# Patient Record
Sex: Male | Born: 1955
Health system: Southern US, Community
[De-identification: ages and names within clinical notes are randomized; demographics above are authoritative.]

## PROBLEM LIST (undated history)

## (undated) DIAGNOSIS — K769 Liver disease, unspecified: Secondary | ICD-10-CM

## (undated) DIAGNOSIS — F32A Depression, unspecified: Secondary | ICD-10-CM

## (undated) DIAGNOSIS — R569 Unspecified convulsions: Secondary | ICD-10-CM

## (undated) DIAGNOSIS — I639 Cerebral infarction, unspecified: Secondary | ICD-10-CM

## (undated) DIAGNOSIS — I10 Essential (primary) hypertension: Secondary | ICD-10-CM

## (undated) DIAGNOSIS — F329 Major depressive disorder, single episode, unspecified: Secondary | ICD-10-CM

## (undated) HISTORY — DX: Unspecified convulsions: R56.9

## (undated) HISTORY — DX: Cerebral infarction, unspecified: I63.9

## (undated) HISTORY — DX: Depression, unspecified: F32.A

## (undated) HISTORY — PX: CATARACT EXTRACTION: SUR2

## (undated) HISTORY — PX: TONSILLECTOMY AND ADENOIDECTOMY: SUR1326

---
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
## 2001-10-08 DIAGNOSIS — N329 Bladder disorder, unspecified: Secondary | ICD-10-CM

## 2001-10-08 HISTORY — DX: Bladder disorder, unspecified: N32.9

## 2002-10-09 ENCOUNTER — Ambulatory Visit (HOSPITAL_BASED_OUTPATIENT_CLINIC_OR_DEPARTMENT_OTHER): Admission: RE | Admit: 2002-10-09 | Discharge: 2002-10-09 | Payer: Self-pay | Admitting: Urology

## 2003-04-09 ENCOUNTER — Ambulatory Visit (HOSPITAL_COMMUNITY): Admission: RE | Admit: 2003-04-09 | Discharge: 2003-04-09 | Payer: Self-pay | Admitting: Urology

## 2003-04-09 ENCOUNTER — Ambulatory Visit (HOSPITAL_BASED_OUTPATIENT_CLINIC_OR_DEPARTMENT_OTHER): Admission: RE | Admit: 2003-04-09 | Discharge: 2003-04-09 | Payer: Self-pay | Admitting: Urology

## 2005-05-23 ENCOUNTER — Encounter: Admission: RE | Admit: 2005-05-23 | Discharge: 2005-05-23 | Payer: Self-pay | Admitting: General Surgery

## 2012-06-06 ENCOUNTER — Other Ambulatory Visit: Payer: Self-pay | Admitting: Gastroenterology

## 2012-06-06 DIAGNOSIS — R197 Diarrhea, unspecified: Secondary | ICD-10-CM

## 2012-06-14 ENCOUNTER — Ambulatory Visit
Admission: RE | Admit: 2012-06-14 | Discharge: 2012-06-14 | Disposition: A | Discharge status: Home / Self Care | Payer: 59 | Source: Ambulatory Visit | Attending: Gastroenterology | Admitting: Gastroenterology

## 2012-06-14 DIAGNOSIS — R197 Diarrhea, unspecified: Secondary | ICD-10-CM

## 2012-06-14 MED ORDER — IOHEXOL 300 MG/ML  SOLN: 100.0000 mL | Freq: Once | INTRAMUSCULAR | Status: AC | PRN

## 2013-10-14 ENCOUNTER — Other Ambulatory Visit: Payer: Self-pay | Admitting: Gastroenterology

## 2013-10-14 DIAGNOSIS — R112 Nausea with vomiting, unspecified: Secondary | ICD-10-CM

## 2013-11-08 ENCOUNTER — Ambulatory Visit
Admission: RE | Admit: 2013-11-08 | Discharge: 2013-11-08 | Disposition: A | Discharge status: Home / Self Care | Payer: PRIVATE HEALTH INSURANCE | Source: Ambulatory Visit | Attending: Gastroenterology | Admitting: Gastroenterology

## 2013-11-08 DIAGNOSIS — R112 Nausea with vomiting, unspecified: Secondary | ICD-10-CM

## 2014-11-20 ENCOUNTER — Ambulatory Visit (INDEPENDENT_AMBULATORY_CARE_PROVIDER_SITE_OTHER): Payer: PRIVATE HEALTH INSURANCE

## 2014-11-20 ENCOUNTER — Ambulatory Visit (INDEPENDENT_AMBULATORY_CARE_PROVIDER_SITE_OTHER): Payer: PRIVATE HEALTH INSURANCE | Admitting: Podiatry

## 2014-11-20 DIAGNOSIS — L600 Ingrowing nail: Secondary | ICD-10-CM

## 2014-11-20 DIAGNOSIS — M2010 Hallux valgus (acquired), unspecified foot: Secondary | ICD-10-CM

## 2014-11-20 DIAGNOSIS — L603 Nail dystrophy: Secondary | ICD-10-CM | POA: Diagnosis not present

## 2014-11-20 DIAGNOSIS — L608 Other nail disorders: Secondary | ICD-10-CM | POA: Diagnosis not present

## 2014-11-20 MED ORDER — NEOMYCIN-POLYMYXIN-HC 3.5-10000-1 OT SOLN: OTIC | Status: DC

## 2014-11-20 NOTE — Progress Notes (Signed)
   Subjective:    Patient ID: Jon [name], male    DOB: [birth year], 59 y.o.   MRN: [id number]  HPI Comments: "I have problems with my feet"  Patient states that he gets numbness 1st toes bilateral-medial borders, left over right, for several months. Some tenderness by end of day. Notices 1st MPJ bilateral get red. Can't find a shoe that fits well.   Toe Pain   Foot Pain Associated symptoms include arthralgias and coughing.      Review of Systems  Respiratory: Positive for cough.   Musculoskeletal: Positive for arthralgias.  Skin:       Change in nails  All other systems reviewed and are negative.      Objective:   Physical Exam: I have reviewed his past medical history medications allergies surgery social history and review of systems. Pulses are palpable bilateral neurologic extensor was intact percent C monofilament. Deep tendon reflex intact bilaterally muscle strength is 5 over 5 dorsiflexion plantar flexors and inverters everters MUSCULATURE is intact. Orthopedic evaluation demonstrate hallux abductovalgus deformities bilateral with pain on palpation of the medial prominence of the head of the first metatarsal. He also has pain on range of motion of the first metatarsophalangeal joint. Cutaneous evaluation demonstrates supple well-hydrated U is dorsally sharply rated now margins to the tibial border of the hallux bilateral. Nails are thick yellow dystrophic onychomycotic painful palpation as well as debridement.        Assessment & Plan:  Assessment: Ingrown nail paronychia abscess hallux left.  Plan: Discussed etiology pathology conservative versus surgical therapies. Performed a chemical matrixectomy to the tibial borders of the hallux bilaterally tolerated his procedure well he was given both oral and written home-going instructions for care and soaking of his foot as well as a prescription for Cortisporin Otic. Samples of the nail and skin were taken today for pathologic  evaluation. I discussed briefly surgical intervention regarding his bunions and he is stating that he would have to be an retirement before he can take that much time off. I will follow-up with him in 1 week.

## 2014-11-20 NOTE — Patient Instructions (Addendum)

## 2014-11-27 ENCOUNTER — Ambulatory Visit (INDEPENDENT_AMBULATORY_CARE_PROVIDER_SITE_OTHER): Payer: PRIVATE HEALTH INSURANCE | Admitting: Podiatry

## 2014-11-27 ENCOUNTER — Encounter: Payer: Self-pay | Admitting: Podiatry

## 2014-11-27 DIAGNOSIS — L600 Ingrowing nail: Secondary | ICD-10-CM

## 2014-11-27 NOTE — Progress Notes (Signed)
This patient presents today for follow-up matrixectomy. They continue to soak twice daily and apply Cortisporin otic as directed. Relating no complaints.  Objective: Vital signs are stable. Secondly site appears to be healing well without erythema or drainage purulence or odor.  Assessment: Well-healing surgical matrixectomy without complications. Hallux abductovalgus deformity.  Plan: Currently we will discontinue the use of Betadine soaks and start with Epsom salts and water twice daily. Continue the use of Cortisporin Otic and covered during the day leaving it open at night time. They will continue to soak the toe until completely well. They will continue to watch the toe for signs and symptoms of infection should any arise we will be notified immediately. Follow-up when necessary. We also discussed the need for orthotics however his insurance does not cover them at this point. I will follow-up with him as needed.

## 2014-11-27 NOTE — Patient Instructions (Signed)

## 2014-12-02 ENCOUNTER — Ambulatory Visit: Payer: PRIVATE HEALTH INSURANCE | Admitting: Podiatry

## 2014-12-18 ENCOUNTER — Encounter: Payer: Self-pay | Admitting: Podiatry

## 2014-12-18 ENCOUNTER — Ambulatory Visit (INDEPENDENT_AMBULATORY_CARE_PROVIDER_SITE_OTHER): Payer: PRIVATE HEALTH INSURANCE | Admitting: Podiatry

## 2014-12-18 VITALS — BP 157/59 | HR 88 | Resp 16

## 2014-12-18 DIAGNOSIS — L603 Nail dystrophy: Secondary | ICD-10-CM

## 2014-12-18 NOTE — Progress Notes (Signed)
He presents today for his lab reports for his toenails and skin.  Objective: Vital signs are stable he is alert and oriented 3. Positive onychomycosis bilateral.  Assessment: Onychomycosis bilateral.  Plan at this point in time he does not want to treat basal history of liver toxicity and a fatty liver. He also does not want utilizing topical.

## 2016-01-08 ENCOUNTER — Inpatient Hospital Stay (HOSPITAL_COMMUNITY)
Admission: EM | Admit: 2016-01-08 | Discharge: 2016-01-14 | DRG: 064 | Disposition: A | Discharge status: Rehabilitation Facility | Payer: PRIVATE HEALTH INSURANCE | Source: Intra-hospital | Attending: Neurology | Admitting: Neurology

## 2016-01-08 ENCOUNTER — Encounter (HOSPITAL_COMMUNITY): Payer: Self-pay

## 2016-01-08 ENCOUNTER — Emergency Department (HOSPITAL_COMMUNITY): Payer: PRIVATE HEALTH INSURANCE

## 2016-01-08 DIAGNOSIS — I10 Essential (primary) hypertension: Secondary | ICD-10-CM | POA: Diagnosis present

## 2016-01-08 DIAGNOSIS — R0602 Shortness of breath: Secondary | ICD-10-CM | POA: Diagnosis not present

## 2016-01-08 DIAGNOSIS — I619 Nontraumatic intracerebral hemorrhage, unspecified: Secondary | ICD-10-CM | POA: Diagnosis present

## 2016-01-08 DIAGNOSIS — I169 Hypertensive crisis, unspecified: Secondary | ICD-10-CM | POA: Diagnosis present

## 2016-01-08 DIAGNOSIS — R269 Unspecified abnormalities of gait and mobility: Secondary | ICD-10-CM | POA: Diagnosis present

## 2016-01-08 DIAGNOSIS — Z79899 Other long term (current) drug therapy: Secondary | ICD-10-CM

## 2016-01-08 DIAGNOSIS — I69398 Other sequelae of cerebral infarction: Secondary | ICD-10-CM | POA: Diagnosis not present

## 2016-01-08 DIAGNOSIS — R739 Hyperglycemia, unspecified: Secondary | ICD-10-CM | POA: Diagnosis present

## 2016-01-08 DIAGNOSIS — Z7982 Long term (current) use of aspirin: Secondary | ICD-10-CM

## 2016-01-08 DIAGNOSIS — E871 Hypo-osmolality and hyponatremia: Secondary | ICD-10-CM | POA: Diagnosis present

## 2016-01-08 DIAGNOSIS — R1310 Dysphagia, unspecified: Secondary | ICD-10-CM | POA: Diagnosis present

## 2016-01-08 DIAGNOSIS — E876 Hypokalemia: Secondary | ICD-10-CM | POA: Diagnosis present

## 2016-01-08 DIAGNOSIS — F32A Depression, unspecified: Secondary | ICD-10-CM | POA: Diagnosis present

## 2016-01-08 DIAGNOSIS — G936 Cerebral edema: Secondary | ICD-10-CM | POA: Diagnosis present

## 2016-01-08 DIAGNOSIS — D696 Thrombocytopenia, unspecified: Secondary | ICD-10-CM | POA: Diagnosis present

## 2016-01-08 DIAGNOSIS — G8194 Hemiplegia, unspecified affecting left nondominant side: Secondary | ICD-10-CM | POA: Diagnosis present

## 2016-01-08 DIAGNOSIS — Z6830 Body mass index (BMI) 30.0-30.9, adult: Secondary | ICD-10-CM

## 2016-01-08 DIAGNOSIS — F1010 Alcohol abuse, uncomplicated: Secondary | ICD-10-CM | POA: Diagnosis present

## 2016-01-08 DIAGNOSIS — E785 Hyperlipidemia, unspecified: Secondary | ICD-10-CM | POA: Diagnosis present

## 2016-01-08 DIAGNOSIS — F329 Major depressive disorder, single episode, unspecified: Secondary | ICD-10-CM | POA: Diagnosis present

## 2016-01-08 DIAGNOSIS — Z91011 Allergy to milk products: Secondary | ICD-10-CM

## 2016-01-08 DIAGNOSIS — Z91018 Allergy to other foods: Secondary | ICD-10-CM | POA: Diagnosis not present

## 2016-01-08 DIAGNOSIS — I610 Nontraumatic intracerebral hemorrhage in hemisphere, subcortical: Secondary | ICD-10-CM | POA: Diagnosis present

## 2016-01-08 DIAGNOSIS — I69319 Unspecified symptoms and signs involving cognitive functions following cerebral infarction: Secondary | ICD-10-CM | POA: Diagnosis not present

## 2016-01-08 DIAGNOSIS — R471 Dysarthria and anarthria: Secondary | ICD-10-CM | POA: Diagnosis present

## 2016-01-08 DIAGNOSIS — I69391 Dysphagia following cerebral infarction: Secondary | ICD-10-CM | POA: Diagnosis present

## 2016-01-08 DIAGNOSIS — I6789 Other cerebrovascular disease: Secondary | ICD-10-CM | POA: Diagnosis not present

## 2016-01-08 DIAGNOSIS — I69359 Hemiplegia and hemiparesis following cerebral infarction affecting unspecified side: Secondary | ICD-10-CM | POA: Diagnosis not present

## 2016-01-08 DIAGNOSIS — E669 Obesity, unspecified: Secondary | ICD-10-CM | POA: Diagnosis present

## 2016-01-08 DIAGNOSIS — K769 Liver disease, unspecified: Secondary | ICD-10-CM | POA: Diagnosis present

## 2016-01-08 DIAGNOSIS — I161 Hypertensive emergency: Secondary | ICD-10-CM | POA: Diagnosis not present

## 2016-01-08 HISTORY — DX: Essential (primary) hypertension: I10

## 2016-01-08 HISTORY — DX: Liver disease, unspecified: K76.9

## 2016-01-08 LAB — COMPREHENSIVE METABOLIC PANEL
ALT: 80 U/L — ABNORMAL HIGH (ref 17–63)
AST: 121 U/L — ABNORMAL HIGH (ref 15–41)
Albumin: 3.5 g/dL (ref 3.5–5.0)
Alkaline Phosphatase: 67 U/L (ref 38–126)
Anion gap: 9 (ref 5–15)
BUN: 8 mg/dL (ref 6–20)
CO2: 22 mmol/L (ref 22–32)
Calcium: 9.4 mg/dL (ref 8.9–10.3)
Chloride: 109 mmol/L (ref 101–111)
Creatinine, Ser: 0.7 mg/dL (ref 0.61–1.24)
GFR calc Af Amer: >60 mL/min (ref >60)
GFR calc non Af Amer: >60 mL/min (ref >60)
Glucose, Bld: 143 mg/dL — ABNORMAL HIGH (ref 65–99)
Potassium: 4.1 mmol/L (ref 3.5–5.1)
Sodium: 140 mmol/L (ref 135–145)
Total Bilirubin: 0.6 mg/dL (ref 0.3–1.2)
Total Protein: 6.5 g/dL (ref 6.5–8.1)

## 2016-01-08 LAB — I-STAT CHEM 8, ED
BUN: 8 mg/dL (ref 6–20)
Calcium, Ion: 1.09 mmol/L — ABNORMAL LOW (ref 1.12–1.23)
Chloride: 105 mmol/L (ref 101–111)
Creatinine, Ser: 0.6 mg/dL — ABNORMAL LOW (ref 0.61–1.24)
Glucose, Bld: 144 mg/dL — ABNORMAL HIGH (ref 65–99)
HCT: 45 % (ref 39.0–52.0)
Hemoglobin: 15.3 g/dL (ref 13.0–17.0)
Potassium: 4 mmol/L (ref 3.5–5.1)
Sodium: 144 mmol/L (ref 135–145)
TCO2: 22 mmol/L (ref 0–100)

## 2016-01-08 LAB — GLUCOSE, CAPILLARY
GLUCOSE-CAPILLARY: 153 mg/dL — AB (ref 65–99)
Glucose-Capillary: 115 mg/dL — ABNORMAL HIGH (ref 65–99)

## 2016-01-08 LAB — DIFFERENTIAL
Basophils Absolute: 0.1 10*3/uL (ref 0.0–0.1)
Basophils Relative: 1 %
Eosinophils Absolute: 0.3 10*3/uL (ref 0.0–0.7)
Eosinophils Relative: 5 %
Lymphocytes Relative: 27 %
Lymphs Abs: 2 10*3/uL (ref 0.7–4.0)
Monocytes Absolute: 0.6 10*3/uL (ref 0.1–1.0)
Monocytes Relative: 8 %
Neutro Abs: 4.5 10*3/uL (ref 1.7–7.7)
Neutrophils Relative %: 59 %

## 2016-01-08 LAB — CBC
HCT: 43.7 % (ref 39.0–52.0)
Hemoglobin: 14.8 g/dL (ref 13.0–17.0)
MCH: 34.4 pg — ABNORMAL HIGH (ref 26.0–34.0)
MCHC: 33.9 g/dL (ref 30.0–36.0)
MCV: 101.6 fL — ABNORMAL HIGH (ref 78.0–100.0)
Platelets: 138 10*3/uL — ABNORMAL LOW (ref 150–400)
RBC: 4.3 MIL/uL (ref 4.22–5.81)
RDW: 12.3 % (ref 11.5–15.5)
WBC: 7.5 10*3/uL (ref 4.0–10.5)

## 2016-01-08 LAB — CBG MONITORING, ED: Glucose-Capillary: 130 mg/dL — ABNORMAL HIGH (ref 65–99)

## 2016-01-08 LAB — MRSA PCR SCREENING: MRSA by PCR: NEGATIVE

## 2016-01-08 LAB — I-STAT TROPONIN, ED: Troponin i, poc: 0 ng/mL (ref 0.00–0.08)

## 2016-01-08 LAB — PROTIME-INR
INR: 1.07 (ref 0.00–1.49)
Prothrombin Time: 14.1 seconds (ref 11.6–15.2)

## 2016-01-08 LAB — APTT: aPTT: 24 seconds (ref 24–37)

## 2016-01-08 MED ORDER — NICARDIPINE HCL IN NACL 20-0.86 MG/200ML-% IV SOLN
3.0000 mg/h | INTRAVENOUS | Status: DC
  Administered 2016-01-08 – 2016-01-09 (×2): 5 mg/h via INTRAVENOUS
  Administered 2016-01-09 (×3): 7.5 mg/h via INTRAVENOUS
  Administered 2016-01-09: 3 mg/h via INTRAVENOUS
  Administered 2016-01-09: 5 mg/h via INTRAVENOUS
  Filled 2016-01-08 (×7): qty 200

## 2016-01-08 MED ORDER — ACETAMINOPHEN-CODEINE #3 300-30 MG PO TABS
1.0000 | ORAL_TABLET | ORAL | Status: DC | PRN
  Administered 2016-01-09 – 2016-01-11 (×8): 2 via ORAL
  Administered 2016-01-12: 1 via ORAL
  Administered 2016-01-12 – 2016-01-14 (×6): 2 via ORAL
  Filled 2016-01-08 (×9): qty 2
  Filled 2016-01-08: qty 1
  Filled 2016-01-08 (×6): qty 2

## 2016-01-08 MED ORDER — ACETAMINOPHEN 325 MG PO TABS
650.0000 mg | ORAL_TABLET | ORAL | Status: DC | PRN
  Administered 2016-01-11 – 2016-01-14 (×2): 650 mg via ORAL
  Filled 2016-01-08 (×4): qty 2

## 2016-01-08 MED ORDER — SENNOSIDES-DOCUSATE SODIUM 8.6-50 MG PO TABS
1.0000 | ORAL_TABLET | Freq: Two times a day (BID) | ORAL | Status: DC
Start: 2016-01-08 — End: 2016-01-14
  Administered 2016-01-10 – 2016-01-14 (×8): 1 via ORAL
  Filled 2016-01-08 (×10): qty 1

## 2016-01-08 MED ORDER — MORPHINE SULFATE (PF) 2 MG/ML IV SOLN
2.0000 mg | INTRAVENOUS | Status: DC | PRN
  Administered 2016-01-08 – 2016-01-10 (×4): 2 mg via INTRAVENOUS
  Filled 2016-01-08 (×4): qty 1

## 2016-01-08 MED ORDER — STROKE: EARLY STAGES OF RECOVERY BOOK
Freq: Once | Status: AC
  Administered 2016-01-08: 18:00:00
  Filled 2016-01-08: qty 1

## 2016-01-08 MED ORDER — LABETALOL HCL 5 MG/ML IV SOLN
10.0000 mg | INTRAVENOUS | Status: DC | PRN
  Administered 2016-01-09: 20 mg via INTRAVENOUS
  Administered 2016-01-09: 40 mg via INTRAVENOUS
  Filled 2016-01-08: qty 4
  Filled 2016-01-08: qty 8

## 2016-01-08 MED ORDER — ACETAMINOPHEN 650 MG RE SUPP
650.0000 mg | RECTAL | Status: DC | PRN
  Administered 2016-01-08 – 2016-01-09 (×2): 650 mg via RECTAL
  Filled 2016-01-08 (×2): qty 1

## 2016-01-08 MED ORDER — NICARDIPINE HCL IN NACL 20-0.86 MG/200ML-% IV SOLN: 3.0000 mg/h | INTRAVENOUS | Status: DC

## 2016-01-08 MED ORDER — FAMOTIDINE IN NACL 20-0.9 MG/50ML-% IV SOLN
20.0000 mg | Freq: Two times a day (BID) | INTRAVENOUS | Status: DC
  Administered 2016-01-08 – 2016-01-09 (×2): 20 mg via INTRAVENOUS
  Filled 2016-01-08 (×2): qty 50

## 2016-01-08 MED ORDER — PNEUMOCOCCAL VAC POLYVALENT 25 MCG/0.5ML IJ INJ
0.5000 mL | INJECTION | INTRAMUSCULAR | Status: AC
  Administered 2016-01-09: 0.5 mL via INTRAMUSCULAR
  Filled 2016-01-08: qty 0.5

## 2016-01-08 MED ORDER — NICARDIPINE HCL IN NACL 20-0.86 MG/200ML-% IV SOLN
INTRAVENOUS | Status: AC
  Filled 2016-01-08: qty 200

## 2016-01-08 MED ORDER — MORPHINE SULFATE (PF) 2 MG/ML IV SOLN
1.0000 mg | Freq: Once | INTRAVENOUS | Status: AC
  Administered 2016-01-08: 1 mg via INTRAVENOUS
  Filled 2016-01-08: qty 1

## 2016-01-08 MED ORDER — FENTANYL CITRATE (PF) 100 MCG/2ML IJ SOLN
50.0000 ug | Freq: Once | INTRAMUSCULAR | Status: AC
  Administered 2016-01-08: 50 ug via INTRAVENOUS
  Filled 2016-01-08: qty 2

## 2016-01-08 NOTE — ED Notes (Signed)
Checked pt. Pupils on 1400 neuro check. Equal and reactive, 3 mm.

## 2016-01-08 NOTE — Code Documentation (Addendum)
60[facility] at [facility]6.  Patient from home where he was attempting to have intercourse with his wife and had a sudden headache over his right eye.  He went to take a shower and went back to bed.  He then fell out of bed and his wife noted he was unable to move his left side.  EMS was called and activated a code stroke for right gaze, left sided weakness and slurred speech.  Stroke team at the bedside on patient arrival.  Labs drawn and patient cleared for CT by Dr. Joni FearsLui.  Patient to CT.  CT completed showing ICH.  Patient transported to A9.  NIHSS 18, see documentation for details and code stroke times.  Patient with right gaze preference, left visual loss, left facial droop, left hemiplegia, loss of sensation on the left side and dysarthria.  Dr. Otelia LimesLindzen to the bedside.  Patient hypertensive and verbal order given for Cardene gtt to keep SBP < 160.  Cardene gtt started at 5mg /hr.  BP within goal, however, patient became progressively agitated r/t the need to urinate and have a bowel movement.  Patient unable to void in the urinal and condom cath placed.  Patient required frequent redirection and continuous efforts to help patient relax.  Patient diaphoretic and HR 100s, however, denies pain other than needing to urinate.  Washcloth and request to decrease temperature in room.  Cardene gtt titrated up to the max of 15mg /hr d/t continued hypertension.  Patient eventually urinated and became calm and relaxed.  Cardene gtt titrated down.  Patient to be admitted to ICU.  Patient's wife at the bedside and updated on plan of care.  Bedside handoff with ED RN Redmond BasemanHayden.

## 2016-01-08 NOTE — Consult Note (Deleted)
Referring Physician: Dr. Wilson Singer    Chief Complaint: Acute onset of headache and left sided weakness.   HPI: Jon [name] is a 60 y.o. male who was last normal this AM at 0940 after waking up and taking a shower. He went back to bed and during marital relations he suddenly developed a headache and left sided weakness. He also had slurring of speech. His headache is severe and located behind his right eye. Initial BP was elevated but was brought under control with nicardipine gtt in the ED.   LSN: [id number] tPA Given: No. Patient has [place].  ICH Score: 1  Past Medical History  Diagnosis Date  . Hypertension   . Liver damage     History reviewed. No pertinent past surgical history.  No family history on file. Social History:  reports that he has never smoked. He does not have any smokeless tobacco history on file. He reports that he drinks about 7.2 oz of alcohol per week. He reports that he does not use illicit drugs.  Allergies:  Allergies  Allergen Reactions  . Other     Tree nuts     Medications: Robinul, metoprolol, pravastatin, ASA, Benadryl, Viagra.  ROS: As per HPI.   Physical Examination: Blood pressure 131/56, pulse 83, temperature 97.6 F (36.4 C), temperature source Oral, resp. rate 21, height _0  (1.727 m), weight 90.719 kg (200 lb), SpO2 97 %.  Neurologic Examination: Ment: Alert and oriented. Able to follow all commands and describe recent symptoms. No agitation noted. Drowsy.  CN: PERRL. Left visual field cut. Rightward eye deviation, can bring eyes to midline but cannot cross to left - this can be overcome with doll's eye maneuver. V - Impaired sensation on left. VII - Left facial droop. Hearing intact to conversation. No hoarseness or hypophonia. Tongue midline.  Motor: RUE and RLE 5/5. LUE 0/5. LLE 3/5.  Sensory: Impaired sensation to LUE and LLE.  Reflexes: Slightly hyperactive on left relative to right. Left toe upgoing.  Cerebellar: No ataxia with FNF on  right.  Gait: Deferred.   Results for orders placed or performed during the hospital encounter of 01/08/16 (from the past 48 hour(s))  Protime-INR     Status: None   Collection Time: 01/08/16 11:20 AM  Result Value Ref Range   Prothrombin Time 14.1 11.6 - 15.2 seconds   INR 1.07 0.00 - 1.49  APTT     Status: None   Collection Time: 01/08/16 11:20 AM  Result Value Ref Range   aPTT 24 24 - 37 seconds  CBC     Status: Abnormal   Collection Time: 01/08/16 11:20 AM  Result Value Ref Range   WBC 7.5 4.0 - 10.5 K/uL   RBC 4.30 4.22 - 5.81 MIL/uL   Hemoglobin 14.8 13.0 - 17.0 g/dL   HCT 43.7 39.0 - 52.0 %   MCV 101.6 (H) 78.0 - 100.0 fL   MCH 34.4 (H) 26.0 - 34.0 pg   MCHC 33.9 30.0 - 36.0 g/dL   RDW 12.3 11.5 - 15.5 %   Platelets 138 (L) 150 - 400 K/uL  Differential     Status: None   Collection Time: 01/08/16 11:20 AM  Result Value Ref Range   Neutrophils Relative % 59 %   Neutro Abs 4.5 1.7 - 7.7 K/uL   Lymphocytes Relative 27 %   Lymphs Abs 2.0 0.7 - 4.0 K/uL   Monocytes Relative 8 %   Monocytes Absolute 0.6 0.1 - 1.0 K/uL  Eosinophils Relative 5 %   Eosinophils Absolute 0.3 0.0 - 0.7 K/uL   Basophils Relative 1 %   Basophils Absolute 0.1 0.0 - 0.1 K/uL  Comprehensive metabolic panel     Status: Abnormal   Collection Time: 01/08/16 11:20 AM  Result Value Ref Range   Sodium 140 135 - 145 mmol/L   Potassium 4.1 3.5 - 5.1 mmol/L   Chloride 109 101 - 111 mmol/L   CO2 22 22 - 32 mmol/L   Glucose, Bld 143 (H) 65 - 99 mg/dL   BUN 8 6 - 20 mg/dL   Creatinine, Ser 0.70 0.61 - 1.24 mg/dL   Calcium 9.4 8.9 - 10.3 mg/dL   Total Protein 6.5 6.5 - 8.1 g/dL   Albumin 3.5 3.5 - 5.0 g/dL   AST 121 (H) 15 - 41 U/L   ALT 80 (H) 17 - 63 U/L   Alkaline Phosphatase 67 38 - 126 U/L   Total Bilirubin 0.6 0.3 - 1.2 mg/dL   GFR calc non Af Amer >60 >60 mL/min   GFR calc Af Amer >60 >60 mL/min    Comment: (NOTE) The eGFR has been calculated using the CKD EPI equation. This calculation  has not been validated in all clinical situations. eGFR's persistently <60 mL/min signify possible Chronic Kidney Disease.    Anion gap 9 5 - 15  I-stat troponin, ED     Status: None   Collection Time: 01/08/16 11:25 AM  Result Value Ref Range   Troponin i, poc 0.00 0.00 - 0.08 ng/mL   Comment 3            Comment: Due to the release kinetics of cTnI, a negative result within the first hours of the onset of symptoms does not rule out myocardial infarction with certainty. If myocardial infarction is still suspected, repeat the test at appropriate intervals.   I-Stat Chem 8, ED     Status: Abnormal   Collection Time: 01/08/16 11:26 AM  Result Value Ref Range   Sodium 144 135 - 145 mmol/L   Potassium 4.0 3.5 - 5.1 mmol/L   Chloride 105 101 - 111 mmol/L   BUN 8 6 - 20 mg/dL   Creatinine, Ser 0.60 (L) 0.61 - 1.24 mg/dL   Glucose, Bld 144 (H) 65 - 99 mg/dL   Calcium, Ion 1.09 (L) 1.12 - 1.23 mmol/L   TCO2 22 0 - 100 mmol/L   Hemoglobin 15.3 13.0 - 17.0 g/dL   HCT 45.0 39.0 - 52.0 %  CBG monitoring, ED     Status: Abnormal   Collection Time: 01/08/16 11:40 AM  Result Value Ref Range   Glucose-Capillary 130 (H) 65 - 99 mg/dL   Ct Head Wo Contrast  01/08/2016  CLINICAL DATA:  Code stroke. Right-sided gaze and headache. Left-sided weakness. EXAM: CT HEAD WITHOUT CONTRAST TECHNIQUE: Contiguous axial images were obtained from the base of the skull through the vertex without intravenous contrast. COMPARISON:  None. FINDINGS: Brain: High-density hematoma centered in the right putamen measuring up to 29 x 52 x 41 mm (31 cc volume). Hemorrhages in this location are usually hypertensive. There is a minimal rim of edema around the hematoma; no signs of underlying ischemic stroke. Local mass effect with partial effacement of the right lateral ventricle. Midline shift is 3 mm at most. No evidence of cortical infarct, hydrocephalus, or mass lesion. Minimal if any chronic microvascular ischemic gliosis  seen around the frontal horn of the lateral ventricle. Vascular: No hyperdense vessel or  unexpected calcification. Skull: Negative for fracture or focal lesion. Sinuses/Orbits: No acute findings. Critical Value/emergent results were called by telephone at the time of interpretation on 01/08/2016 at 11:32 am to Dr. Cheral Marker, who verbally acknowledged these results. IMPRESSION: Right basal ganglia hemorrhage with 31 cc volume. 3 mm midline shift. Electronically Signed   By: Monte Fantasia M.D.   On: 01/08/2016 11:35    Assessment: 60 y.o. male with acute right basal ganglia hemorrhage. Etiology most likely hypertension, although an underlying lesion such as tumor, amyloid angiopathy or AVM is also a differential diagnostic consideration.   Plan: 1. Repeat CT head in 12 hours to assess for possible extension of hemorrhage.  2. MRI brain with and without contrast. MRA of head.  3. Carotid ultrasound.  4. TTE.  5. PT consult, OT consult, Speech consult 6. Telemetry monitoring 7. Frequent neuro checks 8. No antiplatelet medications or anticoagulants. DVT prophylaxis with SCDs.  9. BP management. SBP goal of < 160. Has been started on nicardipine gtt, to be titrated to goal (up to 15 mg/hr).  _0  signed: Dr. Kerney Elbe  01/08/2016, 12:55 PM

## 2016-01-08 NOTE — ED Notes (Signed)
Pt. BIB [facility] as code stroke. Pt. LKW 0940 this AM. Pt. Wife reports that had intercourse prior to pt. Taking shower around 0940. Pt. Then laid in bed for approx 15 min and developed a headache. When patient got up from bed he had profound weakness to L side. Pt. AxO x4. Pt. Has slurred speech, R sided headache around R eye, L sided drift in upper and lower extremity and R sided gaze.

## 2016-01-08 NOTE — ED Provider Notes (Signed)
CSN: [id number]     Arrival date & time 01/08/16  1116 History   None    Chief Complaint  Patient presents with  . Code Stroke     (Consider location/radiation/quality/duration/timing/severity/associated sxs/prior Treatment) HPI   60 year old male brought in as a "code stroke. Last seen normal around 0940 this morning when taking a shower after intercourse. Finished and laid down on bed. He developed a severe headache in his right eye. He went to get up and had significant weakness on his left side. Slurred speech. He says he feels like the left side of his body is not his own. When he holds his left hand with his right it feels like he is holding someone else's hand. He is a past history of hypertension. Denies use of any blood thinning medications.  Past Medical History  Diagnosis Date  . Hypertension   . Liver damage    History reviewed. No pertinent past surgical history. No family history on file. Social History  Substance Use Topics  . Smoking status: Never Smoker   . Smokeless tobacco: None  . Alcohol Use: 7.2 oz/week    0 Standard drinks or equivalent, 12 Glasses of wine per week     Comment: Wife reports chronic alcohol use.     Review of Systems  All systems reviewed and negative, other than as noted in HPI.   Allergies  Other  Home Medications   Prior to Admission medications   Medication Sig Start Date End Date Taking? Authorizing Provider  aspirin 81 MG tablet Take 81 mg by mouth daily.    Historical Provider, MD  DiphenhydrAMINE HCl (BENADRYL ALLERGY PO) Take by mouth.    Historical Provider, MD  GLYCOPYRROLATE PO Take by mouth.    Historical Provider, MD  Metoprolol Succinate (TOPROL XL PO) Take by mouth.    Historical Provider, MD  neomycin-polymyxin-hydrocortisone (CORTISPORIN) otic solution Apply one to two drops to toe after soaking twice daily. 11/20/14   Max T Hyatt, DPM  Pravastatin Sodium (PRAVACHOL PO) Take by mouth.    Historical Provider, MD    BP 177/87 mmHg  Pulse 75  Resp 12  Ht  (1.727 m)  Wt 200 lb (90.719 kg)  BMI 30.42 kg/m2  SpO2 98% Physical Exam  Constitutional: He appears well-developed and well-nourished. No distress.  HENT:  Head: Normocephalic and atraumatic.  Eyes: Conjunctivae are normal. Right eye exhibits no discharge. Left eye exhibits no discharge.  Neck: Neck supple.  Cardiovascular: Normal rate, regular rhythm and normal heart sounds.  Exam reveals no gallop and no friction rub.   No murmur heard. Pulmonary/Chest: Effort normal and breath sounds normal. No respiratory distress.  Abdominal: Soft. He exhibits no distension. There is no tenderness.  Musculoskeletal: He exhibits no edema or tenderness.  Neurological:  Awake. Alert. Speech is dysarthric, but understandable. Answers questions appropriately. Right-sided gaze. Does not cross midline. Right sided nystagmus. Left facial droop. Left hemineglect. Left hemiparesis.  Skin: Skin is warm and dry.  Psychiatric: He has a normal mood and affect. His behavior is normal. Thought content normal.  Nursing note and vitals reviewed.   ED Course  Procedures (including critical care time)  CRITICAL CARE Performed by: Raeford Razor Total critical care time: 35 minutes Critical care time was exclusive of separately billable procedures and treating other patients. Critical care was necessary to treat or prevent imminent or life-threatening deterioration. Critical care was time spent personally by me on the following activities: development of treatment plan  with patient and/or surrogate as well as nursing, discussions with consultants, evaluation of patient's response to treatment, examination of patient, obtaining history from patient or surrogate, ordering and performing treatments and interventions, ordering and review of laboratory studies, ordering and review of radiographic studies, pulse oximetry and re-evaluation of patient's condition.  Labs  Review Labs Reviewed  CBC - Abnormal; Notable for the following:    MCV 101.6 (*)    MCH 34.4 (*)    Platelets 138 (*)    All other components within normal limits  I-STAT CHEM 8, ED - Abnormal; Notable for the following:    Creatinine, Ser 0.60 (*)    Glucose, Bld 144 (*)    Calcium, Ion 1.09 (*)    All other components within normal limits  DIFFERENTIAL  PROTIME-INR  APTT  COMPREHENSIVE METABOLIC PANEL  I-STAT TROPOININ, ED  CBG MONITORING, ED    Imaging Review No results found. I have personally reviewed and evaluated these images and lab results as part of my medical decision-making.   EKG Interpretation   Date/Time:  Friday January 08 2016 11:31:53 EDT Ventricular Rate:  71 PR Interval:  164 QRS Duration: 82 QT Interval:  388 QTC Calculation: 422 R Axis:   67 Text Interpretation:  Normal sinus rhythm No old tracing to compare  Confirmed by Tommi Crepeau  MD, Mykah Bellomo ([id number]) on 01/08/2016 11:40:01 AM      MDM   Final diagnoses:  Hemorrhagic stroke (HCC)    60 year old male with significant neuro deficits from hemorrhagic stroke. Evaluated by neurology and will be admitted to their service.    Raeford RazorStephen Constantina Laseter, MD 01/20/16 [phone]

## 2016-01-08 NOTE — Care Management Note (Signed)
Case Management Note  Patient Details  Name: Tawanna CoolerGeorge D Jay MRN: [id number] Date of Birth: [birth year]  Subjective/Objective:                  a 60 y.o. male who was last normal this AM at 0940 after waking up and taking a shower. He went back to bed and during marital relations he suddenly developed a headache and left sided weakness. He also had slurring of speech. His headache is severe and located behind his right eye. Initial BP was elevated but was brought under control with nicardipine gtt in the ED. /From home with spouse.  Action/Plan: Follow for disposition needs.   Expected Discharge Date:  01/11/16               Expected Discharge Plan:  Home w Home Health Services  In-House Referral:  NA  Discharge planning Services  CM Consult  Post Acute Care Choice:    Choice offered to:     DME Arranged:    DME Agency:     HH Arranged:    HH Agency:     Status of Service:  In process, will continue to follow  Medicare Important Message Given:    Date Medicare IM Given:    Medicare IM give by:    Date Additional Medicare IM Given:    Additional Medicare Important Message give by:     If discussed at Long Length of Stay Meetings, dates discussed:    Additional Comments:  Oletta CohnWood, Sadhana Frater, RN 01/08/2016, 1:30 PM

## 2016-01-08 NOTE — H&P (Signed)
Referring Physician: Dr. Wilson Singer    Chief Complaint: Acute onset of headache and left sided weakness.   HPI: ALEXEY RHOADS is a 60 y.o. male who was last normal this AM at 0940 after waking up and taking a shower. He went back to bed and during marital relations he suddenly developed a headache and left sided weakness. He also had slurring of speech. His headache is severe and located behind his right eye. Initial BP was elevated but was brought under control with nicardipine gtt in the ED.   LSN: [id number] tPA Given: No. Patient has [place].  ICH Score: 1  Past Medical History  Diagnosis Date  . Hypertension   . Liver damage     History reviewed. No pertinent past surgical history.  No family history on file. Social History:  reports that he has never smoked. He does not have any smokeless tobacco history on file. He reports that he drinks about 7.2 oz of alcohol per week. He reports that he does not use illicit drugs.  Allergies:  Allergies  Allergen Reactions  . Lactose Intolerance (Gi) Nausea And Vomiting  . Other     Tree nuts     Medications: Robinul, metoprolol, pravastatin, ASA, Benadryl, Viagra.  ROS: As per HPI.   Physical Examination: Blood pressure 137/73, pulse 78, temperature 98.7 F (37.1 C), temperature source Oral, resp. rate 17, height '5\' 8"'  (1.727 m), weight 90.719 kg (200 lb), SpO2 94 %.  Neurologic Examination: Ment: Alert and oriented. Able to follow all commands and describe recent symptoms. No agitation noted. Drowsy.  CN: PERRL. Left visual field cut. Rightward eye deviation, can bring eyes to midline but cannot cross to left - this can be overcome with doll's eye maneuver. V - Impaired sensation on left. VII - Left facial droop. Hearing intact to conversation. No hoarseness or hypophonia. Tongue midline.  Motor: RUE and RLE 5/5. LUE 0/5. LLE 3/5.  Sensory: Impaired sensation to LUE and LLE.  Reflexes: Slightly hyperactive on left relative to right. Left toe  upgoing.  Cerebellar: No ataxia with FNF on right.  Gait: Deferred.   Results for orders placed or performed during the hospital encounter of 01/08/16 (from the past 48 hour(s))  Protime-INR     Status: None   Collection Time: 01/08/16 11:20 AM  Result Value Ref Range   Prothrombin Time 14.1 11.6 - 15.2 seconds   INR 1.07 0.00 - 1.49  APTT     Status: None   Collection Time: 01/08/16 11:20 AM  Result Value Ref Range   aPTT 24 24 - 37 seconds  CBC     Status: Abnormal   Collection Time: 01/08/16 11:20 AM  Result Value Ref Range   WBC 7.5 4.0 - 10.5 K/uL   RBC 4.30 4.22 - 5.81 MIL/uL   Hemoglobin 14.8 13.0 - 17.0 g/dL   HCT 43.7 39.0 - 52.0 %   MCV 101.6 (H) 78.0 - 100.0 fL   MCH 34.4 (H) 26.0 - 34.0 pg   MCHC 33.9 30.0 - 36.0 g/dL   RDW 12.3 11.5 - 15.5 %   Platelets 138 (L) 150 - 400 K/uL  Differential     Status: None   Collection Time: 01/08/16 11:20 AM  Result Value Ref Range   Neutrophils Relative % 59 %   Neutro Abs 4.5 1.7 - 7.7 K/uL   Lymphocytes Relative 27 %   Lymphs Abs 2.0 0.7 - 4.0 K/uL   Monocytes Relative 8 %  Monocytes Absolute 0.6 0.1 - 1.0 K/uL   Eosinophils Relative 5 %   Eosinophils Absolute 0.3 0.0 - 0.7 K/uL   Basophils Relative 1 %   Basophils Absolute 0.1 0.0 - 0.1 K/uL  Comprehensive metabolic panel     Status: Abnormal   Collection Time: 01/08/16 11:20 AM  Result Value Ref Range   Sodium 140 135 - 145 mmol/L   Potassium 4.1 3.5 - 5.1 mmol/L   Chloride 109 101 - 111 mmol/L   CO2 22 22 - 32 mmol/L   Glucose, Bld 143 (H) 65 - 99 mg/dL   BUN 8 6 - 20 mg/dL   Creatinine, Ser 0.70 0.61 - 1.24 mg/dL   Calcium 9.4 8.9 - 10.3 mg/dL   Total Protein 6.5 6.5 - 8.1 g/dL   Albumin 3.5 3.5 - 5.0 g/dL   AST 121 (H) 15 - 41 U/L   ALT 80 (H) 17 - 63 U/L   Alkaline Phosphatase 67 38 - 126 U/L   Total Bilirubin 0.6 0.3 - 1.2 mg/dL   GFR calc non Af Amer >60 >60 mL/min   GFR calc Af Amer >60 >60 mL/min    Comment: (NOTE) The eGFR has been calculated  using the CKD EPI equation. This calculation has not been validated in all clinical situations. eGFR's persistently <60 mL/min signify possible Chronic Kidney Disease.    Anion gap 9 5 - 15  I-stat troponin, ED     Status: None   Collection Time: 01/08/16 11:25 AM  Result Value Ref Range   Troponin i, poc 0.00 0.00 - 0.08 ng/mL   Comment 3            Comment: Due to the release kinetics of cTnI, a negative result within the first hours of the onset of symptoms does not rule out myocardial infarction with certainty. If myocardial infarction is still suspected, repeat the test at appropriate intervals.   I-Stat Chem 8, ED     Status: Abnormal   Collection Time: 01/08/16 11:26 AM  Result Value Ref Range   Sodium 144 135 - 145 mmol/L   Potassium 4.0 3.5 - 5.1 mmol/L   Chloride 105 101 - 111 mmol/L   BUN 8 6 - 20 mg/dL   Creatinine, Ser 0.60 (L) 0.61 - 1.24 mg/dL   Glucose, Bld 144 (H) 65 - 99 mg/dL   Calcium, Ion 1.09 (L) 1.12 - 1.23 mmol/L   TCO2 22 0 - 100 mmol/L   Hemoglobin 15.3 13.0 - 17.0 g/dL   HCT 45.0 39.0 - 52.0 %  CBG monitoring, ED     Status: Abnormal   Collection Time: 01/08/16 11:40 AM  Result Value Ref Range   Glucose-Capillary 130 (H) 65 - 99 mg/dL  MRSA PCR Screening     Status: None   Collection Time: 01/08/16  4:59 PM  Result Value Ref Range   MRSA by PCR NEGATIVE NEGATIVE    Comment:        The GeneXpert MRSA Assay (FDA approved for NASAL specimens only), is one component of a comprehensive MRSA colonization surveillance program. It is not intended to diagnose MRSA infection nor to guide or monitor treatment for MRSA infections.   Glucose, capillary     Status: Abnormal   Collection Time: 01/08/16  5:59 PM  Result Value Ref Range   Glucose-Capillary 153 (H) 65 - 99 mg/dL   Comment 1 Notify RN    Ct Head Wo Contrast  01/08/2016  CLINICAL DATA:  Code  stroke. Right-sided gaze and headache. Left-sided weakness. EXAM: CT HEAD WITHOUT CONTRAST  TECHNIQUE: Contiguous axial images were obtained from the base of the skull through the vertex without intravenous contrast. COMPARISON:  None. FINDINGS: Brain: High-density hematoma centered in the right putamen measuring up to 29 x 52 x 41 mm (31 cc volume). Hemorrhages in this location are usually hypertensive. There is a minimal rim of edema around the hematoma; no signs of underlying ischemic stroke. Local mass effect with partial effacement of the right lateral ventricle. Midline shift is 3 mm at most. No evidence of cortical infarct, hydrocephalus, or mass lesion. Minimal if any chronic microvascular ischemic gliosis seen around the frontal horn of the lateral ventricle. Vascular: No hyperdense vessel or unexpected calcification. Skull: Negative for fracture or focal lesion. Sinuses/Orbits: No acute findings. Critical Value/emergent results were called by telephone at the time of interpretation on 01/08/2016 at 11:32 am to Dr. Cheral Marker, who verbally acknowledged these results. IMPRESSION: Right basal ganglia hemorrhage with 31 cc volume. 3 mm midline shift. Electronically Signed   By: Monte Fantasia M.D.   On: 01/08/2016 11:35    Assessment: 60 y.o. male with acute right basal ganglia hemorrhage. Etiology most likely hypertension, although an underlying lesion such as tumor, amyloid angiopathy or AVM is also a differential diagnostic consideration.   Plan: 1. Repeat CT head in 12 hours to assess for possible extension of hemorrhage.  2. MRI brain with and without contrast. MRA of head.  3. Carotid ultrasound.  4. TTE.  5. PT consult, OT consult, Speech consult 6. Telemetry monitoring 7. Frequent neuro checks 8. No antiplatelet medications or anticoagulants. DVT prophylaxis with SCDs.  9. BP management. SBP goal of < 160. Has been started on nicardipine gtt, to be titrated to goal (up to 15 mg/hr).  '@Electronically'  signed: Dr. Kerney Elbe  01/08/2016, 9:52 PM

## 2016-01-08 NOTE — Progress Notes (Signed)
While rounding in ED I was approached by pharm. tech  who thought that the patient's wife could use some emotional support. I visited with patient's wife and provided emotional and spiritual support. I prayed with patient per wife request and spend brief moments talking with husband. Will follow as needed.   01/08/16 1400  Clinical Encounter Type  Visited With Patient;Family;Health care provider  Visit Type Initial;Spiritual support;Critical Care;ED  Referral From Nurse  Spiritual Encounters  Spiritual Needs Prayer;Emotional  Stress Factors  Family Stress Factors Exhausted;Health changes;Major life changes  Jadore Veals, [address],[phone]

## 2016-01-09 ENCOUNTER — Inpatient Hospital Stay (HOSPITAL_COMMUNITY): Payer: PRIVATE HEALTH INSURANCE

## 2016-01-09 DIAGNOSIS — E785 Hyperlipidemia, unspecified: Secondary | ICD-10-CM

## 2016-01-09 DIAGNOSIS — I6789 Other cerebrovascular disease: Secondary | ICD-10-CM

## 2016-01-09 LAB — CBC
HCT: 42.3 % (ref 39.0–52.0)
HEMOGLOBIN: 14.2 g/dL (ref 13.0–17.0)
MCH: 33.6 pg (ref 26.0–34.0)
MCHC: 33.6 g/dL (ref 30.0–36.0)
MCV: 100.2 fL — ABNORMAL HIGH (ref 78.0–100.0)
PLATELETS: 168 10*3/uL (ref 150–400)
RBC: 4.22 MIL/uL (ref 4.22–5.81)
RDW: 12.6 % (ref 11.5–15.5)
WBC: 10.7 10*3/uL — AB (ref 4.0–10.5)

## 2016-01-09 LAB — ECHOCARDIOGRAM COMPLETE
CHL CUP DOP CALC LVOT VTI: 31.5 cm
CHL CUP TV REG PEAK VELOCITY: 305 cm/s
E decel time: 236 msec
EERAT: 12.32
FS: 50 % — AB (ref 28–44)
Height: 68 in
IV/PV OW: 1.14
LA vol A4C: 70.6 ml
LADIAMINDEX: 1.61 cm/m2
LASIZE: 34 mm
LAVOL: 48.7 mL
LAVOLIN: 23.1 mL/m2
LEFT ATRIUM END SYS DIAM: 34 mm
LV E/e'average: 12.32
LV SIMPSON'S DISK: 72
LV dias vol: 81 mL (ref 62–150)
LV e' LATERAL: 6.85 cm/s
LV sys vol index: 11 mL/m2
LV sys vol: 23 mL (ref 21–61)
LVDIAVOLIN: 38 mL/m2
LVEEMED: 12.32
LVOT area: 2.84 cm2
LVOT diameter: 19 mm
LVOT peak grad rest: 8 mmHg
LVOTPV: 137 cm/s
LVOTSV: 89 mL
MV Dec: 236
MV Peak grad: 3 mmHg
MV pk A vel: 99 m/s
MVPKEVEL: 84.4 m/s
PW: 12.5 mm — AB (ref 0.6–1.1)
Stroke v: 58 ml
TAPSE: 21.9 mm
TDI e' lateral: 6.85
TDI e' medial: 7.29
TR max vel: 305 cm/s
WEIGHTICAEL: 3200 [oz_av]

## 2016-01-09 LAB — BASIC METABOLIC PANEL
Anion gap: 11 (ref 5–15)
BUN: 9 mg/dL (ref 6–20)
CHLORIDE: 104 mmol/L (ref 101–111)
CO2: 22 mmol/L (ref 22–32)
Calcium: 9.2 mg/dL (ref 8.9–10.3)
Creatinine, Ser: 0.69 mg/dL (ref 0.61–1.24)
Glucose, Bld: 144 mg/dL — ABNORMAL HIGH (ref 65–99)
POTASSIUM: 3.8 mmol/L (ref 3.5–5.1)
SODIUM: 137 mmol/L (ref 135–145)

## 2016-01-09 LAB — LIPID PANEL
CHOL/HDL RATIO: 4.9 ratio
CHOLESTEROL: 250 mg/dL — AB (ref 0–200)
HDL: 51 mg/dL (ref >40)
LDL Cholesterol: 157 mg/dL — ABNORMAL HIGH (ref 0–99)
TRIGLYCERIDES: 212 mg/dL — AB (ref <150)
VLDL: 42 mg/dL — ABNORMAL HIGH (ref 0–40)

## 2016-01-09 LAB — TSH: TSH: 0.835 u[IU]/mL (ref 0.350–4.500)

## 2016-01-09 LAB — VITAMIN B12: Vitamin B-12: 449 pg/mL (ref 180–914)

## 2016-01-09 LAB — GLUCOSE, CAPILLARY
GLUCOSE-CAPILLARY: 149 mg/dL — AB (ref 65–99)
GLUCOSE-CAPILLARY: 165 mg/dL — AB (ref 65–99)
Glucose-Capillary: 122 mg/dL — ABNORMAL HIGH (ref 65–99)
Glucose-Capillary: 124 mg/dL — ABNORMAL HIGH (ref 65–99)

## 2016-01-09 LAB — RPR: RPR: NONREACTIVE

## 2016-01-09 LAB — HIV ANTIBODY (ROUTINE TESTING W REFLEX): HIV Screen 4th Generation wRfx: NONREACTIVE

## 2016-01-09 MED ORDER — LORAZEPAM 1 MG PO TABS: 1.0000 mg | ORAL_TABLET | Freq: Four times a day (QID) | ORAL | Status: AC | PRN

## 2016-01-09 MED ORDER — THIAMINE HCL 100 MG/ML IJ SOLN: 100.0000 mg | INTRAMUSCULAR | Status: DC

## 2016-01-09 MED ORDER — LORAZEPAM 2 MG/ML IJ SOLN: 1.0000 mg | Freq: Four times a day (QID) | INTRAMUSCULAR | Status: AC | PRN

## 2016-01-09 MED ORDER — SODIUM CHLORIDE 0.9 % IV SOLN
INTRAVENOUS | Status: DC
Start: 2016-01-09 — End: 2016-01-09

## 2016-01-09 MED ORDER — VITAMIN B-1 100 MG PO TABS
100.0000 mg | ORAL_TABLET | Freq: Every day | ORAL | Status: DC
  Administered 2016-01-10 – 2016-01-14 (×5): 100 mg via ORAL
  Filled 2016-01-09 (×5): qty 1

## 2016-01-09 MED ORDER — ONDANSETRON HCL 4 MG/2ML IJ SOLN
4.0000 mg | Freq: Three times a day (TID) | INTRAMUSCULAR | Status: AC | PRN
  Administered 2016-01-09: 4 mg via INTRAVENOUS
  Filled 2016-01-09: qty 2

## 2016-01-09 MED ORDER — M.V.I. ADULT IV INJ: INJECTION | Freq: Once | INTRAVENOUS | Status: DC

## 2016-01-09 MED ORDER — THIAMINE HCL 100 MG/ML IJ SOLN
Freq: Once | INTRAVENOUS | Status: AC
  Administered 2016-01-09: 10:00:00 via INTRAVENOUS
  Filled 2016-01-09: qty 1000

## 2016-01-09 MED ORDER — PANTOPRAZOLE SODIUM 40 MG PO TBEC
40.0000 mg | DELAYED_RELEASE_TABLET | Freq: Every day | ORAL | Status: DC
Start: 2016-01-09 — End: 2016-01-14
  Administered 2016-01-10 – 2016-01-14 (×5): 40 mg via ORAL
  Filled 2016-01-09 (×5): qty 1

## 2016-01-09 MED ORDER — FOLIC ACID 5 MG/ML IJ SOLN
1.0000 mg | Freq: Every day | INTRAMUSCULAR | Status: DC
  Filled 2016-01-09: qty 0.2

## 2016-01-09 MED ORDER — FOLIC ACID 5 MG/ML IJ SOLN: 1.0000 mg | Freq: Every day | INTRAMUSCULAR | Status: DC

## 2016-01-09 MED ORDER — FOLIC ACID 1 MG PO TABS
1.0000 mg | ORAL_TABLET | Freq: Every day | ORAL | Status: DC
  Administered 2016-01-10 – 2016-01-14 (×5): 1 mg via ORAL
  Filled 2016-01-09 (×5): qty 1

## 2016-01-09 MED ORDER — ADULT MULTIVITAMIN W/MINERALS CH
1.0000 | ORAL_TABLET | Freq: Every day | ORAL | Status: DC
  Administered 2016-01-10 – 2016-01-14 (×5): 1 via ORAL
  Filled 2016-01-09 (×5): qty 1

## 2016-01-09 MED ORDER — METOPROLOL SUCCINATE ER 100 MG PO TB24
100.0000 mg | ORAL_TABLET | Freq: Two times a day (BID) | ORAL | Status: DC
  Administered 2016-01-09 – 2016-01-12 (×6): 100 mg via ORAL
  Filled 2016-01-09 (×2): qty 1
  Filled 2016-01-09: qty 4
  Filled 2016-01-09 (×3): qty 1
  Filled 2016-01-09: qty 4

## 2016-01-09 MED ORDER — IOPAMIDOL (ISOVUE-370) INJECTION 76%
INTRAVENOUS | Status: AC
  Administered 2016-01-09: 50 mL
  Filled 2016-01-09: qty 50

## 2016-01-09 NOTE — Progress Notes (Signed)
STROKE TEAM PROGRESS NOTE   HISTORY OF PRESENT ILLNESS (per record) Jon [name] is a 60 y.o. male who was last normal this AM at 0940 after waking up and taking a shower. He went back to bed and during marital relations he suddenly developed a headache and left sided weakness. He also had slurring of speech. His headache is severe and located behind his right eye. Initial BP was elevated but was brought under control with nicardipine gtt in the ED.   LSN: [id number] tPA Given: No. Patient has ICH.  ICH Score: 1  SUBJECTIVE (INTERVAL HISTORY) His wife is at the bedside.  Overall he feels his condition is stable. He had slurry speech but awake and alert and appropriate with questions. Still on cardene. Going to have CTA head and neck. Pending speech.   OBJECTIVE Temp:  [97.6 F (36.4 C)-98.8 F (37.1 C)] 98.8 F (37.1 C) (06/17 0400) Pulse Rate:  [66-105] 95 (06/17 0715) Cardiac Rhythm:  [-] Normal sinus rhythm (06/16 2000) Resp:  [11-29] 22 (06/17 0715) BP: (122-189)/(56-96) 155/80 mmHg (06/17 0715) SpO2:  [91 %-99 %] 96 % (06/17 0715) Weight:  [90.719 kg (200 lb)] 90.719 kg (200 lb) (06/16 1130)  CBC:  Recent Labs Lab 01/08/16 1120 01/08/16 1126  WBC 7.5  --   NEUTROABS 4.5  --   HGB 14.8 15.3  HCT 43.7 45.0  MCV 101.6*  --   PLT 138*  --     Basic Metabolic Panel:  Recent Labs Lab 01/08/16 1120 01/08/16 1126  NA 140 144  K 4.1 4.0  CL 109 105  CO2 22  --   GLUCOSE 143* 144*  BUN 8 8  CREATININE 0.70 0.60*  CALCIUM 9.4  --     Lipid Panel: No results found for: CHOL, TRIG, HDL, CHOLHDL, VLDL, LDLCALC HgbA1c: No results found for: HGBA1C Urine Drug Screen: No results found for: LABOPIA, COCAINSCRNUR, LABBENZ, AMPHETMU, THCU, LABBARB    IMAGING  Ct Head Wo Contrast 01/08/2016   Right basal ganglia hemorrhage with 31 cc volume. 3 mm midline shift.   CTA head and neck - pending  TTE - pending    PHYSICAL EXAM  Temp:  [97.6 F (36.4 C)-98.8 F (37.1  C)] 98.6 F (37 C) (06/17 0800) Pulse Rate:  [66-105] 89 (06/17 0800) Resp:  [11-29] 14 (06/17 0800) BP: (122-189)/(56-96) 137/69 mmHg (06/17 0800) SpO2:  [91 %-99 %] 96 % (06/17 0800) Weight:  [200 lb (90.719 kg)] 200 lb (90.719 kg) (06/16 1130)  General - Well nourished, well developed, in no apparent distress.  Ophthalmologic - Fundi not visualized due to eye movement.  Cardiovascular - Regular rate and rhythm.  Mental Status -  Level of arousal and orientation to time, place, and person were intact. Language including expression, naming, repetition, comprehension was assessed and found intact, moderate dysarthria Fund of Knowledge was assessed and was intact.  Cranial Nerves II - XII - II - Visual field intact OU. III, IV, VI - Extraocular movements intact. V - Facial sensation decreased on the left. VII - left facial droop. VIII - Hearing & vestibular intact bilaterally. X - Palate elevates symmetrically. XI - Chin turning & shoulder shrug intact bilaterally. XII - Tongue protrusion intact.  Motor Strength - The patient's strength was 0/5 LUE and mild withdraw at LLE, 5/5 RUE and RLE and pronator drift was absent.  Bulk was normal and fasciculations were absent.   Motor Tone - Muscle tone was assessed at the neck  and appendages and was normal.  Reflexes - The patient's reflexes were 1+ in all extremities and he had no pathological reflexes.  Sensory - Light touch, temperature/pinprick were assessed and were decreased on the left, 10% of right.    Coordination - The patient had normal movements in the right hand with no ataxia or dysmetria.  Tremor was absent.  Gait and Station - deferred due to weakness   ASSESSMENT/PLAN Jon [name] is a 60 y.o. male with history of hypertension, alcohol abuse, and liver damage presenting with severe headache, left-sided weakness, slurred speech, and elevated blood pressure. He did not receive IV t-PA due to right basal ganglia  hemorrhage.  ICH:  right basal ganglia hemorrhage likely due to hypertension  Resultant  Left hemiplegia, dysarthria  CT head showed left BG ICH  CTA of head and neck - pending  2D Echo  pending  LDL 157  HgbA1c pending  VTE prophylaxis - SCDs  Diet NPO time specified - pending speech eval  No antithrombotic prior to admission, now on No antithrombotic.  Ongoing aggressive stroke risk factor management  Therapy recommendations: Pending  Disposition: Pending  Hypertension  Stable - on cardene  BP goal < 160  Will resume po meds after po access  Wean off cardene as able  Hyperlipidemia  Home meds: Pravachol 40 mg daily - not resumed secondary to hemorrhage.  LDL 157, goal < 70  Continue statin at discharge  Other Stroke Risk Factors  ETOH use, advised to drink no more than 1 - 2 drinks a day  Obesity, Body mass index is 30.42 kg/(m^2)., recommend weight loss, diet and exercise as appropriate   Other Active Problems  NPO - pending   Elevated glucose  Hospital day # 1  This patient is critically ill due to ICH and hypertensive emergency and at significant risk of neurological worsening, death form increased hematoma, cerebral edema and brain herniation and heart failure. This patient's care requires constant monitoring of vital signs, hemodynamics, respiratory and cardiac monitoring, review of multiple databases, neurological assessment, discussion with family, other specialists and medical decision making of high complexity. I spent 40 minutes of neurocritical care time in the care of this patient.  Marvel PlanJindong Killian Ress, MD PhD Stroke Neurology 01/09/2016 9:47 AM   To contact Stroke Continuity provider, please refer to [email or web address]. After hours, contact General Neurology

## 2016-01-09 NOTE — Evaluation (Signed)
Clinical/Bedside Swallow Evaluation Patient Details  Name: Jon [name] MRN: [id number] Date of Birth: June 25, 1956  Today's Date: 01/09/2016 Time: SLP Start Time (ACUTE ONLY): 1035 SLP Stop Time (ACUTE ONLY): 1124 SLP Time Calculation (min) (ACUTE ONLY): 49 min  Past Medical History:  Past Medical History  Diagnosis Date  . Hypertension   . Liver damage    Past Surgical History: History reviewed. No pertinent past surgical history. HPI:  60 [facility] headache over right eye, left sided weakness  after intercourse with his wife.  Wife found him in down in bathroom diaphoretic.  Pt found to have a right basal hemorrhagic cva = + hematoma.  Pt PMH + for urinary retention, gall bladder issues, IBS and reflux.  Pt failed an RNSSS and speech/swallow eval ordered. CXR 6/17 negative.  Pt reports he is LEFT HANDED.  He denies dysphagia prior to admission.    Assessment / Plan / Recommendation Clinical Impression  Pt is very impulsive and reports he is LEFT HANDED.  Cranial nerve deficits impacting hypoglossal, facial and trigeminal nerves present resulting in dysarthria/dysphagia.  Decreased labial seal/closure resulted in loss of liquids anteriorally - from left.  Use of straw on right side helpful.  Pt denies sensory deficits on left facial, however buccal residuals noted with solids without awareness.  Cues to clear using finger sweep needed.  Question if pt with decreased left attention and/or visual deficits without awareness.  No indication of airway compromise with intake observed.  Using teach back, educated pt/wife to findings/recommendations. Advised spouse to approach pt from left side to help improve his attention.  Wife reports pt vomits nearly every day at home and therefore strict reflux precautions indicated. Recommend dys1/thin diet with strict aspiration precautions.        Aspiration Risk  Moderate aspiration risk    Diet Recommendation Dysphagia 1  (Puree);Thin liquid   Liquid Administration via: Cup;Straw Medication Administration: Whole meds with puree Supervision: Full supervision/cueing for compensatory strategies;Patient able to self feed Compensations: Minimize environmental distractions;Small sips/bites;Slow rate;Lingual sweep for clearance of pocketing    Other  Recommendations Oral Care Recommendations: Oral care QID   Follow up Recommendations    ? CIR    Frequency and Duration min 2x/week  2 weeks       Prognosis Prognosis for Safe Diet Advancement: Fair Barriers to Reach Goals: Cognitive deficits;Behavior Barriers/Prognosis Comment: wife reports pt is impulsive prior to admission      Swallow Study   General Date of Onset: 01/09/16 HPI: 60 [facility] headache over right eye, left sided weakness  after intercourse with his wife.  Wife found him in down in bathroom diaphoretic.  Pt found to have a right basal hemorrhagic cva = + hematoma.  Pt PMH + for urinary retention, gall bladder issues, IBS and reflux.  Pt failed an RNSSS and speech/swallow eval ordered. CXR 6/17 negative.  Pt reports he is LEFT HANDED.  He denies dysphagia prior to admission.  Type of Study: Bedside Swallow Evaluation Diet Prior to this Study: NPO Temperature Spikes Noted: No Respiratory Status: Room air Behavior/Cognition: Alert Oral Cavity Assessment: Within Functional Limits Oral Care Completed by SLP: No Oral Cavity - Dentition: Adequate natural dentition Vision: Impaired for self-feeding (? left inattention and/or visual deficits) Self-Feeding Abilities: Needs assist Patient Positioning: Upright in bed Baseline Vocal Quality: Low vocal intensity;Hoarse Volitional Cough: Weak Volitional Swallow: Unable to elicit    Oral/Motor/Sensory Function Overall Oral  Motor/Sensory Function: Moderate impairment Facial ROM: Reduced left;Suspected CN VII (facial) dysfunction Facial Symmetry: Abnormal symmetry left;Suspected CN  VII (facial) dysfunction Facial Strength: Reduced left;Suspected CN VII (facial) dysfunction Facial Sensation: Reduced left;Suspected CN V (Trigeminal) dysfunction Lingual Strength: Reduced;Suspected CN XII (hypoglossal) dysfunction Velum: Within Functional Limits   Ice Chips Ice chips: Impaired Presentation: Spoon Oral Phase Impairments: Poor awareness of bolus;Reduced lingual movement/coordination;Reduced labial seal Oral Phase Functional Implications: Prolonged oral transit Pharyngeal Phase Impairments: Suspected delayed Swallow   Thin Liquid Thin Liquid: Impaired Presentation: Cup;Self Fed;Spoon;Straw Oral Phase Impairments: Reduced lingual movement/coordination;Reduced labial seal Oral Phase Functional Implications: Prolonged oral transit;Left anterior spillage    Nectar Thick Nectar Thick Liquid: Impaired Presentation: Cup;Self Fed;Straw Oral Phase Impairments: Reduced lingual movement/coordination;Reduced labial seal Oral phase functional implications: Prolonged oral transit;Left anterior spillage Pharyngeal Phase Impairments: Suspected delayed Swallow   Honey Thick Honey Thick Liquid: Not tested   Puree Puree: Impaired Presentation: Spoon Oral Phase Impairments: Reduced labial seal;Reduced lingual movement/coordination Oral Phase Functional Implications: Prolonged oral transit Pharyngeal Phase Impairments: Suspected delayed Swallow   Solid   GO   Solid: Impaired Presentation: Self Fed;Spoon Oral Phase Impairments: Reduced labial seal;Reduced lingual movement/coordination;Poor awareness of bolus;Impaired mastication Oral Phase Functional Implications: Impaired mastication;Prolonged oral transit;Oral residue;Left anterior spillage;Left lateral sulci pocketing Pharyngeal Phase Impairments: Suspected delayed Swallow Other Comments: pt with poor awareness to oral residuals, verbal/visual cues to clear with finger sweep         Chales Abrahams 01/09/2016,11:46 AM   Donavan Burnet, MS [facility] SLP [phone]

## 2016-01-09 NOTE — Progress Notes (Signed)
  Echocardiogram 2D Echocardiogram has been performed.  Janalyn HarderWest, Vienne Corcoran R 01/09/2016, 12:07 PM

## 2016-01-10 LAB — CBC
HEMATOCRIT: 43.2 % (ref 39.0–52.0)
HEMOGLOBIN: 13.8 g/dL (ref 13.0–17.0)
MCH: 32.8 pg (ref 26.0–34.0)
MCHC: 31.9 g/dL (ref 30.0–36.0)
MCV: 102.6 fL — AB (ref 78.0–100.0)
Platelets: 147 10*3/uL — ABNORMAL LOW (ref 150–400)
RBC: 4.21 MIL/uL — ABNORMAL LOW (ref 4.22–5.81)
RDW: 12.7 % (ref 11.5–15.5)
WBC: 9.8 10*3/uL (ref 4.0–10.5)

## 2016-01-10 LAB — BASIC METABOLIC PANEL
Anion gap: 8 (ref 5–15)
BUN: 7 mg/dL (ref 6–20)
CALCIUM: 8.9 mg/dL (ref 8.9–10.3)
CHLORIDE: 103 mmol/L (ref 101–111)
CO2: 27 mmol/L (ref 22–32)
CREATININE: 0.58 mg/dL — AB (ref 0.61–1.24)
GFR calc non Af Amer: >60 mL/min (ref >60)
GLUCOSE: 113 mg/dL — AB (ref 65–99)
Potassium: 3.4 mmol/L — ABNORMAL LOW (ref 3.5–5.1)
Sodium: 138 mmol/L (ref 135–145)

## 2016-01-10 LAB — GLUCOSE, CAPILLARY
GLUCOSE-CAPILLARY: 167 mg/dL — AB (ref 65–99)
GLUCOSE-CAPILLARY: 102 mg/dL — AB (ref 65–99)
Glucose-Capillary: 125 mg/dL — ABNORMAL HIGH (ref 65–99)
Glucose-Capillary: 106 mg/dL — ABNORMAL HIGH (ref 65–99)

## 2016-01-10 MED ORDER — POTASSIUM CHLORIDE CRYS ER 20 MEQ PO TBCR
40.0000 meq | EXTENDED_RELEASE_TABLET | ORAL | Status: AC
  Administered 2016-01-10: 40 meq via ORAL
  Filled 2016-01-10 (×2): qty 2

## 2016-01-10 MED ORDER — RESOURCE THICKENUP CLEAR PO POWD
ORAL | Status: DC | PRN
  Filled 2016-01-10: qty 125

## 2016-01-10 MED ORDER — HEPARIN SODIUM (PORCINE) 5000 UNIT/ML IJ SOLN
5000.0000 [IU] | Freq: Three times a day (TID) | INTRAMUSCULAR | Status: DC
  Administered 2016-01-10 – 2016-01-14 (×12): 5000 [IU] via SUBCUTANEOUS
  Filled 2016-01-10 (×13): qty 1

## 2016-01-10 MED ORDER — AMLODIPINE BESYLATE 10 MG PO TABS
10.0000 mg | ORAL_TABLET | Freq: Every day | ORAL | Status: DC
  Administered 2016-01-10 – 2016-01-14 (×5): 10 mg via ORAL
  Filled 2016-01-10 (×5): qty 1

## 2016-01-10 MED ORDER — SODIUM CHLORIDE 0.9 % IV SOLN
INTRAVENOUS | Status: DC
  Administered 2016-01-10: 75 mL via INTRAVENOUS
  Administered 2016-01-11 – 2016-01-12 (×3): via INTRAVENOUS

## 2016-01-10 MED ORDER — ONDANSETRON HCL 4 MG/2ML IJ SOLN: 4.0000 mg | Freq: Three times a day (TID) | INTRAMUSCULAR | Status: DC | PRN

## 2016-01-10 MED ORDER — LABETALOL HCL 5 MG/ML IV SOLN
10.0000 mg | INTRAVENOUS | Status: DC | PRN
  Administered 2016-01-12: 20 mg via INTRAVENOUS
  Filled 2016-01-10: qty 4

## 2016-01-10 NOTE — Progress Notes (Signed)
Speech Language Pathology Treatment: Dysphagia  Patient Details Name: Jon [name] MRN: [id number] DOB: 08/31/1955 Today's Date: 01/10/2016 Time: [id number] SLP Time Calculation (min) (ACUTE ONLY): 33 min  Assessment / Plan / Recommendation Clinical Impression  RN reports limited intake since eval on previous date due to nausea, but some coughing overnight with thin liquids. This morning he has significant anterior loss on his left side without awareness, and has consistent throat clearing after sips of thin. Suspect that this is in part related to impulsive intake and pt leaning toward left due to presentation similar to pusher syndrome. His overt signs of airway compromise are reduced with nectar thick liquids. Recommend to continue Dys 1 diet but with nectar thick liquids. Encourage self-feeding, but will need full supervision.   HPI HPI: 60 [facility] headache over right eye, left sided weakness  after intercourse with his wife.  Wife found him in down in bathroom diaphoretic.  Pt found to have a right basal hemorrhagic cva = + hematoma.  Pt PMH + for urinary retention, gall bladder issues, IBS and reflux.  Pt failed an RNSSS and speech/swallow eval ordered. CXR 6/17 negative.  Pt reports he is LEFT HANDED.  He denies dysphagia prior to admission.       SLP Plan  Continue with current plan of care     Recommendations  Diet recommendations: Dysphagia 1 (puree);Nectar-thick liquid Liquids provided via: Cup;Straw Medication Administration: Crushed with puree Supervision: Patient able to self feed;Full supervision/cueing for compensatory strategies Compensations: Minimize environmental distractions;Small sips/bites;Slow rate;Lingual sweep for clearance of pocketing Postural Changes and/or Swallow Maneuvers: Seated upright 90 degrees;Upright 30-60 min after meal             Oral Care Recommendations: Oral care BID;Oral care before and after PO Follow up  Recommendations: Inpatient Rehab Plan: Continue with current plan of care     GO               Maxcine HamLaura Paiewonsky, M.A. CCC-SLP [phone]  Maxcine Hamaiewonsky, Quinita Kostelecky 01/10/2016, 11:06 AM

## 2016-01-10 NOTE — Evaluation (Addendum)
Speech Language Pathology Evaluation Patient Details Name: Jon [name] D Derk MRN: [id number] DOB: 1955/09/18 Today's Date: 01/10/2016 Time: [id number] SLP Time Calculation (min) (ACUTE ONLY): 33 min  Problem List:  Patient Active Problem List   Diagnosis Date Noted  . Hemorrhagic stroke (HCC) 01/08/2016  . ICH (intracerebral hemorrhage) (HCC) 01/08/2016   Past Medical History:  Past Medical History  Diagnosis Date  . Hypertension   . Liver damage    Past Surgical History: History reviewed. No pertinent past surgical history. HPI:  60 yo male [facility] after severe headache over right eye, left sided weakness  after intercourse with his wife.  Wife found him in down in bathroom diaphoretic.  Pt found to have a right basal hemorrhagic cva = + hematoma.  Pt PMH + for urinary retention, gall bladder issues, IBS and reflux.  Pt failed an RNSSS and speech/swallow eval ordered. CXR 6/17 negative.  Pt reports he is LEFT HANDED.  He denies dysphagia prior to admission.    Assessment / Plan / Recommendation Clinical Impression  Pt has significant cognitive deficits that impact his level of safety and independence. He has left-inattention requiring Mod-Max cues to attend to his left visual field. He does attempt to problem solve during functional tasks, but he needs Min-Mod cues to successfully complete them. Intellectual awareness is a relative strength, but he has difficulty with emergent and anticipatory awareness. He is perseverative on wanting to go to sleep and wanting alcohol. Mild dysarthria noted due to left-sided facial weakness. Pt will benefit from SLP f/u both acutely and at CIR level.     SLP Assessment  Patient needs continued Speech Lanaguage Pathology Services    Follow Up Recommendations  Inpatient Rehab    Frequency and Duration min 2x/week  2 weeks      SLP Evaluation Prior Functioning  Cognitive/Linguistic Baseline: Information not available (suspect WFL, pt says he was  working [employer]TA) Type of Home: House  Lives With: Spouse Vocation: Full time employment   Cognition  Overall Cognitive Status: Impaired/Different from baseline Arousal/Alertness: Lethargic Orientation Level: Oriented X4 Attention: Sustained Sustained Attention: Impaired Sustained Attention Impairment: Verbal basic;Functional basic Awareness: Impaired Awareness Impairment: Emergent impairment;Anticipatory impairment Problem Solving: Impaired Problem Solving Impairment: Functional basic Executive Function: Self Monitoring;Organizing Organizing: Impaired Organizing Impairment: Verbal basic Self Monitoring: Impaired Self Monitoring Impairment: Functional basic Behaviors: Impulsive;Perseveration Safety/Judgment: Impaired Comments: pt perseverative on wanting to go to sleep    Comprehension  Auditory Comprehension Overall Auditory Comprehension: Appears within functional limits for tasks assessed    Expression Expression Primary Mode of Expression: Verbal Verbal Expression Overall Verbal Expression: Appears within functional limits for tasks assessed Written Expression Dominant Hand: Left   Oral / Motor  Oral Motor/Sensory Function Overall Oral Motor/Sensory Function: Moderate impairment Facial ROM: Reduced left;Suspected CN VII (facial) dysfunction Facial Symmetry: Abnormal symmetry left;Suspected CN VII (facial) dysfunction Facial Strength: Reduced left;Suspected CN VII (facial) dysfunction Facial Sensation: Reduced left;Suspected CN V (Trigeminal) dysfunction Lingual Strength: Reduced;Suspected CN XII (hypoglossal) dysfunction Velum: Within Functional Limits Motor Speech Overall Motor Speech: Impaired Respiration: Within functional limits Phonation: Normal Resonance: Within functional limits Articulation: Impaired Level of Impairment: Conversation Intelligibility: Intelligible Motor Planning: Witnin functional limits Motor Speech Errors: Not applicable   GO                    Maxcine HamLaura Paiewonsky, M.A. CCC-SLP [phone]  Maxcine Hamaiewonsky, Austin Pongratz 01/10/2016, 11:34 AM

## 2016-01-10 NOTE — Progress Notes (Signed)
Inpatient Rehabilitation  Per PT and SLP request patient was screened by Jon [name] for appropriateness for an Inpatient Acute Rehab consult.  At this time we are recommending an Inpatient Rehab consult.  Please order if you are agreeable.    Jon [name], M.A., CCC/SLP Admission Coordinator  [facility]  Cell [phone]  

## 2016-01-10 NOTE — Progress Notes (Signed)
STROKE TEAM PROGRESS NOTE   SUBJECTIVE (INTERVAL HISTORY) His RN is at the bedside.  Overall he feels his condition is stable. Passed swallow on diet but he does not have any appetite. Po input was low and will restart IVF. He still has left hemiplegia. Complain HA and responding to tylenol 3 but complains sick feeling with morphine. Repeat CT stable hematoma yesterday.    OBJECTIVE Temp:  [98.4 F (36.9 C)-99.3 F (37.4 C)] 98.4 F (36.9 C) (06/18 0800) Pulse Rate:  [77-102] 80 (06/18 0914) Cardiac Rhythm:  [-] Normal sinus rhythm (06/18 0100) Resp:  [12-22] 16 (06/18 0545) BP: (127-162)/(65-90) 148/75 mmHg (06/18 0912) SpO2:  [89 %-96 %] 92 % (06/18 0700)  CBC:  Recent Labs Lab 01/08/16 1120  01/09/16 0807 01/10/16 0404  WBC 7.5  --  10.7* 9.8  NEUTROABS 4.5  --   --   --   HGB 14.8  < > 14.2 13.8  HCT 43.7  < > 42.3 43.2  MCV 101.6*  --  100.2* 102.6*  PLT 138*  --  168 147*  < > = values in this interval not displayed.  Basic Metabolic Panel:   Recent Labs Lab 01/09/16 0807 01/10/16 0404  NA 137 138  K 3.8 3.4*  CL 104 103  CO2 22 27  GLUCOSE 144* 113*  BUN 9 7  CREATININE 0.69 0.58*  CALCIUM 9.2 8.9    Lipid Panel:     Component Value Date/Time   CHOL 250* 01/09/2016 0807   TRIG 212* 01/09/2016 0807   HDL 51 01/09/2016 0807   CHOLHDL 4.9 01/09/2016 0807   VLDL 42* 01/09/2016 0807   LDLCALC 157* 01/09/2016 0807   HgbA1c: No results found for: HGBA1C Urine Drug Screen: No results found for: LABOPIA, COCAINSCRNUR, LABBENZ, AMPHETMU, THCU, LABBARB    IMAGING I have personally reviewed the radiological images below and agree with the radiology interpretations.  Ct Head Wo Contrast 01/08/2016   Right basal ganglia hemorrhage with 31 cc volume. 3 mm midline shift.   CTA head and neck  01/09/2016 Unchanged RIGHT basal ganglia hemorrhage, favored to represent a hypertensive related bleed.  Volume today 32 mm, essentially unchanged. Mild 3 mm  RIGHT-to-LEFT shift. No intracranial or extracranial stenosis, dissection, aneurysm, or vascular malformation.  TTE  01/09/2016 Study Conclusions - Left ventricle: The cavity size was normal. Wall thickness was  increased in a pattern of moderate LVH. Systolic function was  normal. The estimated ejection fraction was in the range of 60% to 65%.    Wall motion was normal; there were no regional wall  motion abnormalities. Left ventricular diastolic function  parameters were normal. - Pulmonary arteries: PA peak pressure: 52 mm Hg (S). Impressions: - No cardiac source of emboli was indentified.    PHYSICAL EXAM  Temp:  [98.4 F (36.9 C)-99.3 F (37.4 C)] 98.4 F (36.9 C) (06/18 0800) Pulse Rate:  [77-102] 80 (06/18 0914) Resp:  [12-22] 16 (06/18 0545) BP: (127-162)/(65-90) 148/75 mmHg (06/18 0912) SpO2:  [89 %-96 %] 92 % (06/18 0700)  General - Well nourished, well developed, in no apparent distress.  Ophthalmologic - Fundi not visualized due to eye movement.  Cardiovascular - Regular rate and rhythm.  Mental Status -  Level of arousal and orientation to time, place, and person were intact. Language including expression, naming, repetition, comprehension was assessed and found intact, moderate dysarthria Fund of Knowledge was assessed and was intact.  Cranial Nerves II - XII - II - Visual  field intact OU. III, IV, VI - Extraocular movements intact. V - Facial sensation decreased on the left. VII - left facial droop. VIII - Hearing & vestibular intact bilaterally. X - Palate elevates symmetrically. XI - Chin turning & shoulder shrug intact bilaterally. XII - Tongue protrusion intact.  Motor Strength - The patient's strength was 0/5 LUE and LLE, 5/5 RUE and RLE and pronator drift was absent.  Bulk was normal and fasciculations were absent.   Motor Tone - Muscle tone was assessed at the neck and appendages and was normal.  Reflexes - The patient's reflexes were 1+  in all extremities and he had no pathological reflexes.  Sensory - Light touch, temperature/pinprick were assessed and were decreased on the left, 10% of right.    Coordination - The patient had normal movements in the right hand with no ataxia or dysmetria.  Tremor was absent.  Gait and Station - deferred due to weakness   ASSESSMENT/PLAN [name] is a 60 y.o. male with history of hypertension, alcohol abuse, and liver damage presenting with severe headache, left-sided weakness, slurred speech, and elevated blood pressure. He did not receive IV t-PA due to right basal ganglia hemorrhage.  ICH:  right basal ganglia hemorrhage likely due to hypertension  Resultant  Left hemiplegia, dysarthria  CT head showed left BG ICH  CTA of head and neck - Unchanged RIGHT basal ganglia hemorrhage.  2D Echo  EF 60 - 65%   LDL 157  HgbA1c pending  VTE prophylaxis - SCDs and heparin subq  DIET - DYS 1 Room service appropriate?: Yes; Fluid consistency:: Thin - pending speech eval  No antithrombotic prior to admission, now on No antithrombotic.  Ongoing aggressive stroke risk factor management  Therapy recommendations: Pending  Disposition: Pending  Hypertension  Stable - off cardene now  BP goal < 160  On home metoprolol  Add amlodipine  Hyperlipidemia  Home meds: Pravachol 40 mg daily - not resumed secondary to hemorrhage.  LDL 157, goal < 70  Continue statin at discharge  Other Stroke Risk Factors  ETOH use, advised to drink no more than 1 - 2 drinks a day  Obesity, Body mass index is 30.42 kg/(m^2)., recommend weight loss, diet and exercise as appropriate   Other Active Problems  Lack of appetite - put on IVF   Elevated glucose - CBG monitoring  Hypokalemia - 3.4 - supplement   Hospital day # 2  This patient is critically ill due to ICH and hypertensive emergency and at significant risk of neurological worsening, death form increased hematoma,  cerebral edema and brain herniation and heart failure. This patient's care requires constant monitoring of vital signs, hemodynamics, respiratory and cardiac monitoring, review of multiple databases, neurological assessment, discussion with family, other specialists and medical decision making of high complexity. I spent 35 minutes of neurocritical care time in the care of this patient.  Marvel PlanJindong Alantis Bethune, MD PhD Stroke Neurology 01/10/2016 10:31 AM  To contact Stroke Continuity provider, please refer to [email or web address]. After hours, contact General Neurology

## 2016-01-10 NOTE — Evaluation (Signed)
Physical Therapy Evaluation Patient Details Name: Jon [name] MRN: [id number] DOB: 1955/10/27 Today's Date: 01/10/2016   History of Present Illness  60 y.o. male with history of hypertension, alcohol abuse, and liver damage presenting with severe headache, left-sided weakness, slurred speech, and elevated blood pressure. patient found to have ICH: right basal ganglia hemorrhage likely due to hypertension  Clinical Impression  Patient demonstrates deficits in functional mobility as indicated below. Will need continued skilled PT to address deficits and maximize function. Will see as indicated and progress as tolerated. Patient currently presenting with significant deficits related to ICH. At this time, feel patient will need comprehensive therapies upon acute discharge, highly recommend CIR consult.  OF NOTE: Bp 158 systolic upon activity with saturations 93% on room air.     Follow Up Recommendations CIR    Equipment Recommendations  Wheelchair (measurements PT);Wheelchair cushion (measurements PT)    Recommendations for Other Services Rehab consult     Precautions / Restrictions Precautions Precautions: Fall Precaution Comments: BP paremeters <160 systolic Restrictions Weight Bearing Restrictions: No      Mobility  Bed Mobility Overal bed mobility: Needs Assistance;+2 for physical assistance Bed Mobility: Supine to Sit;Sit to Supine     Supine to sit: Max assist;+2 for physical assistance Sit to supine: Max assist;+2 for physical assistance   General bed mobility comments: patient required 2 person max assist to come to EOB, patient was able to demonstrate some functional use of RUE to pull to sitting and RLE movement to EOB. Patient complained of back pain during transition  Transfers Overall transfer level: Needs assistance   Transfers: Sit to/from Stand Sit to Stand: Max assist;+2 physical assistance         General transfer comment: +2 max assist to come to  standing x3 with LLE blocked out and faciliatation of upright postures provided. Face to face with gait belt support and 2 persons required. No active engagement in LLE.  Ambulation/Gait                Stairs            Wheelchair Mobility    Modified Rankin (Stroke Patients Only) Modified Rankin (Stroke Patients Only) Pre-Morbid Rankin Score: No symptoms Modified Rankin: Severe disability     Balance Overall balance assessment: Needs assistance Sitting-balance support: Single extremity supported;Feet supported Sitting balance-Leahy Scale: Zero Sitting balance - Comments: required Max assist for sitting EOB, noted pushers behavior with RUE, poor ability to maintain posture without facilitation and cues Postural control: Left lateral lean   Standing balance-Leahy Scale: Zero                               Pertinent Vitals/Pain Pain Assessment: Faces Faces Pain Scale: No hurt Pain Location: back pain Pain Intervention(s): Monitored during session    Home Living Family/patient expects to be discharged to:: Inpatient rehab Living Arrangements: Spouse/significant other   Type of Home: House                Prior Function Level of Independence: Independent               Hand Dominance   Dominant Hand: Left    Extremity/Trunk Assessment   Upper Extremity Assessment: LUE deficits/detail       LUE Deficits / Details: flaccid LUE    Lower Extremity Assessment: LLE deficits/detail   LLE Deficits / Details: extensor tone noted LLE, no active movement,  does withdrawl to pain  Cervical / Trunk Assessment:  (poor trunk control)  Communication      Cognition Arousal/Alertness: Awake/alert Behavior During Therapy: Restless;Impulsive;Flat affect Overall Cognitive Status: Impaired/Different from baseline Area of Impairment: Attention;Memory;Following commands;Safety/judgement;Awareness;Problem solving   Current Attention Level:  Focused   Following Commands: Follows one step commands with increased time Safety/Judgement: Decreased awareness of safety;Decreased awareness of deficits Awareness: Intellectual Problem Solving: Slow processing;Decreased initiation;Difficulty sequencing;Requires verbal cues;Requires tactile cues General Comments: Patient with noted left side inattention, some disorganized thoughts intermittently throughout session, easily distracted cues for re-direction to task.    General Comments      Exercises        Assessment/Plan    PT Assessment Patient needs continued PT services  PT Diagnosis Difficulty walking;Abnormality of gait;Generalized weakness;Acute pain;Hemiplegia dominant side   PT Problem List Decreased strength;Decreased range of motion;Decreased activity tolerance;Decreased balance;Decreased mobility;Decreased coordination;Decreased cognition;Decreased knowledge of use of DME;Decreased safety awareness;Decreased knowledge of precautions;Impaired sensation;Impaired tone;Pain  PT Treatment Interventions DME instruction;Gait training;Functional mobility training;Therapeutic activities;Therapeutic exercise;Balance training;Neuromuscular re-education;Cognitive remediation;Patient/family education;Wheelchair mobility training   PT Goals (Current goals can be found in the Care Plan section) Acute Rehab PT Goals Patient Stated Goal: to go to bed PT Goal Formulation: With patient Time For Goal Achievement: 01/24/16 Potential to Achieve Goals: Good    Frequency Min 4X/week   Barriers to discharge        Co-evaluation               End of Session Equipment Utilized During Treatment: Gait belt Activity Tolerance: Patient limited by fatigue Patient left: in bed;with call bell/phone within reach;with bed alarm set Nurse Communication: Mobility status         Time: 1610-9604 PT Time Calculation (min) (ACUTE ONLY): 33 min   Charges:   PT Evaluation $PT Eval High  Complexity: 1 Procedure     PT G CodesFabio [name] 07-Feb-2016, 11:32 AM Jon [name], PT DPT  [phone]

## 2016-01-11 DIAGNOSIS — I619 Nontraumatic intracerebral hemorrhage, unspecified: Secondary | ICD-10-CM

## 2016-01-11 DIAGNOSIS — R269 Unspecified abnormalities of gait and mobility: Secondary | ICD-10-CM

## 2016-01-11 DIAGNOSIS — I161 Hypertensive emergency: Secondary | ICD-10-CM

## 2016-01-11 DIAGNOSIS — R0602 Shortness of breath: Secondary | ICD-10-CM

## 2016-01-11 DIAGNOSIS — I69391 Dysphagia following cerebral infarction: Secondary | ICD-10-CM

## 2016-01-11 DIAGNOSIS — I10 Essential (primary) hypertension: Secondary | ICD-10-CM

## 2016-01-11 DIAGNOSIS — I69398 Other sequelae of cerebral infarction: Secondary | ICD-10-CM

## 2016-01-11 DIAGNOSIS — D696 Thrombocytopenia, unspecified: Secondary | ICD-10-CM

## 2016-01-11 DIAGNOSIS — G8194 Hemiplegia, unspecified affecting left nondominant side: Secondary | ICD-10-CM

## 2016-01-11 DIAGNOSIS — E871 Hypo-osmolality and hyponatremia: Secondary | ICD-10-CM

## 2016-01-11 DIAGNOSIS — I610 Nontraumatic intracerebral hemorrhage in hemisphere, subcortical: Principal | ICD-10-CM

## 2016-01-11 LAB — HEMOGLOBIN A1C
Hgb A1c MFr Bld: 5.5 % (ref 4.8–5.6)
Mean Plasma Glucose: 111 mg/dL

## 2016-01-11 LAB — TROPONIN I
Troponin I: <0.03 ng/mL (ref <0.031)
Troponin I: <0.03 ng/mL (ref <0.031)

## 2016-01-11 LAB — GLUCOSE, CAPILLARY
GLUCOSE-CAPILLARY: 101 mg/dL — AB (ref 65–99)
GLUCOSE-CAPILLARY: 110 mg/dL — AB (ref 65–99)
Glucose-Capillary: 92 mg/dL (ref 65–99)
Glucose-Capillary: 104 mg/dL — ABNORMAL HIGH (ref 65–99)

## 2016-01-11 LAB — BASIC METABOLIC PANEL
ANION GAP: 9 (ref 5–15)
BUN: 8 mg/dL (ref 6–20)
CHLORIDE: 103 mmol/L (ref 101–111)
CO2: 22 mmol/L (ref 22–32)
Calcium: 9 mg/dL (ref 8.9–10.3)
Creatinine, Ser: 0.54 mg/dL — ABNORMAL LOW (ref 0.61–1.24)
GFR calc Af Amer: >60 mL/min (ref >60)
GFR calc non Af Amer: >60 mL/min (ref >60)
Glucose, Bld: 95 mg/dL (ref 65–99)
POTASSIUM: 4 mmol/L (ref 3.5–5.1)
SODIUM: 134 mmol/L — AB (ref 135–145)

## 2016-01-11 LAB — CBC
HCT: 42.8 % (ref 39.0–52.0)
HEMOGLOBIN: 14.3 g/dL (ref 13.0–17.0)
MCH: 34 pg (ref 26.0–34.0)
MCHC: 33.4 g/dL (ref 30.0–36.0)
MCV: 101.9 fL — ABNORMAL HIGH (ref 78.0–100.0)
Platelets: 134 10*3/uL — ABNORMAL LOW (ref 150–400)
RBC: 4.2 MIL/uL — AB (ref 4.22–5.81)
RDW: 12.1 % (ref 11.5–15.5)
WBC: 8.8 10*3/uL (ref 4.0–10.5)

## 2016-01-11 NOTE — Progress Notes (Signed)
Speech Language Pathology Treatment: Dysphagia  Patient Details Name: Jon [name] MRN: [id number] DOB: 04-19-1956 Today's Date: 01/11/2016 Time: 1545-1600 SLP Time Calculation (min) (ACUTE ONLY): 15 min  Assessment / Plan / Recommendation Clinical Impression  Dysphagia treatment provided today for diet tolerance check and patient education/ cueing for compensatory strategies. Pt showed no immediate overt s/s of aspiration with trials of nectar thick/ puree consistencies. Pt had consistent anterior spillage on L side and wiped off with cues- not independently sensing. Pt had a delayed throat clear x1 following initial large sip of nectar-thick liquids; provided moderate verbal cues for small bites/ sips which pt was able to follow and no further s/s of aspiration noted. Given pt's impulsivity/ decreased sustained attention and decreased sensation on L side, recommend continuing nectar-thick liquids/ dysphagia 1 consistency, continue full supervision during meals to cue small bites/ sips, minimize distractions, monitor alertness/ attention to meal, check for pocketing on L side. Will continue to follow for dysphagia and cognition.    HPI HPI: 60 [facility] severe headache over right eye, left sided weakness  after intercourse with his wife.  Wife found him in down in bathroom diaphoretic.  Pt found to have a right basal hemorrhagic cva = + hematoma.  Pt PMH + for urinary retention, gall bladder issues, IBS and reflux.  Pt failed an RNSSS and speech/swallow eval ordered. CXR 6/17 negative.  Pt reports he is LEFT HANDED.  He denies dysphagia prior to admission.       SLP Plan  Continue with current plan of care     Recommendations  Diet recommendations: Dysphagia 1 (puree);Nectar-thick liquid Liquids provided via: Cup;Straw Medication Administration: Crushed with puree Supervision: Patient able to self feed;Full supervision/cueing for compensatory strategies Compensations: Minimize  environmental distractions;Small sips/bites;Slow rate;Lingual sweep for clearance of pocketing Postural Changes and/or Swallow Maneuvers: Seated upright 90 degrees;Upright 30-60 min after meal             Oral Care Recommendations: Oral care BID;Oral care before and after PO Follow up Recommendations: Inpatient Rehab Plan: Continue with current plan of care     GO                Metro KungOleksiak, Jon K, MA, CCC-SLP 01/11/2016, 4:04 PM  [phone]

## 2016-01-11 NOTE — Progress Notes (Signed)
Pt refused last Troponin lab draw.

## 2016-01-11 NOTE — Progress Notes (Signed)
Inpatient Rehabilitation  PMR consult has been competed.  Admissions coordinator will follow up with pt. and family tomorrow.    Jon PickingSusan Minor Iden PT Inpatient Rehab Admissions Coordinator Cell [phone] Office [phone]

## 2016-01-11 NOTE — Care Management Note (Signed)
Case Management Note  Patient Details  Name: Jon [name] MRN: [id number] Date of Birth: 28-Oct-1955  Subjective/Objective:                    Action/Plan: PT recommendations are for CIR. CM following for d/c needs.   Expected Discharge Date:  01/11/16               Expected Discharge Plan:  Home w Home Health Services  In-House Referral:  NA  Discharge planning Services  CM Consult  Post Acute Care Choice:    Choice offered to:     DME Arranged:    DME Agency:     HH Arranged:    HH Agency:     Status of Service:  In process, will continue to follow  Medicare Important Message Given:    Date Medicare IM Given:    Medicare IM give by:    Date Additional Medicare IM Given:    Additional Medicare Important Message give by:     If discussed at Long Length of Stay Meetings, dates discussed:    Additional Comments:  Kermit BaloKelli F Jakya Dovidio, RN 01/11/2016, 11:40 AM

## 2016-01-11 NOTE — Progress Notes (Signed)
Physical Therapy Treatment Patient Details Name: Jon [name] MRN: [id number] DOB: 1955-12-31 Today's Date: 01/11/2016    History of Present Illness 60 y.o. male with history of hypertension, alcohol abuse, and liver damage presenting with severe headache, left-sided weakness, slurred speech, and elevated blood pressure. patient found to have ICH: right basal ganglia hemorrhage likely due to hypertension    PT Comments    Patient progressing slowly towards PT goals. Tolerated static standing with assist of 2 for facilitation of trunk musculature and upright posture as well as stabilization of LLE. Demonstrates some pushing in seated position. Lethargic during session. Continues to exhibit left inattention and hemiplegia. Difficulty managing secretions during mobility. Will continue to follow.   Follow Up Recommendations  CIR     Equipment Recommendations  Wheelchair (measurements PT);Wheelchair cushion (measurements PT)    Recommendations for Other Services       Precautions / Restrictions Precautions Precautions: Fall Precaution Comments: BP paremeters <160 systolic Restrictions Weight Bearing Restrictions: No    Mobility  Bed Mobility Overal bed mobility: Needs Assistance;+2 for physical assistance Bed Mobility: Supine to Sit;Sit to Supine     Supine to sit: Max assist;+2 for physical assistance Sit to supine: Max assist;+2 for physical assistance   General bed mobility comments: patient required 2 person max assist to come to EOB, patient was able to use RUE to pull to sitting and RLE movement to get to EOB. No active movement of LUE; some adduction of LLE.  Transfers Overall transfer level: Needs assistance Equipment used:  (back of chair) Transfers: Sit to/from Stand Sit to Stand: Max assist;+2 physical assistance         General transfer comment: +2 max assist to come to standing x3 with LLE blocked out and facilitation of upright postures through hips and  chest. Approximation through LUE. Heavy left lateral lean and difficulty managing secretions. No active engagement in LLE.  Ambulation/Gait                 Stairs            Wheelchair Mobility    Modified Rankin (Stroke Patients Only) Modified Rankin (Stroke Patients Only) Pre-Morbid Rankin Score: No symptoms Modified Rankin: Severe disability     Balance Overall balance assessment: Needs assistance Sitting-balance support: Feet supported;Single extremity supported Sitting balance-Leahy Scale: Zero Sitting balance - Comments: required Max assist for sitting EOB, noted pushers behavior with RUE, requires manual cues for facilitation of upright posture. Poor awareness. Postural control: Left lateral lean Standing balance support: During functional activity Standing balance-Leahy Scale: Zero Standing balance comment: Requires assist of 2 for standing balance- therapist stabilizing LLE and assisting with trunk as pt with left lateral lean. Able to stand for ~1-2 mins, ~1 min                    Cognition Arousal/Alertness: Awake/alert Behavior During Therapy: Flat affect;Impulsive Overall Cognitive Status: Impaired/Different from baseline Area of Impairment: Problem solving;Safety/judgement;Following commands;Attention   Current Attention Level: Focused (progressing to sustained)   Following Commands: Follows one step commands with increased time     Problem Solving: Slow processing;Decreased initiation;Difficulty sequencing;Requires verbal cues;Requires tactile cues General Comments: Left inattention noted. Making up things he sees on his left side based on what he knows is normally near a sink.    Exercises      General Comments General comments (skin integrity, edema, etc.): Wife present during session.      Pertinent Vitals/Pain Pain Assessment: Faces  Faces Pain Scale: Hurts a little bit Pain Location: back Pain Descriptors / Indicators:  Sore Pain Intervention(s): Monitored during session    Home Living                      Prior Function            PT Goals (current goals can now be found in the care plan section) Progress towards PT goals: Progressing toward goals (slowly)    Frequency  Min 4X/week    PT Plan Current plan remains appropriate    Co-evaluation PT/OT/SLP Co-Evaluation/Treatment: Yes Reason for Co-Treatment: Complexity of the patient's impairments (multi-system involvement);For patient/therapist safety PT goals addressed during session: Mobility/safety with mobility;Strengthening/ROM;Balance       End of Session Equipment Utilized During Treatment: Gait belt Activity Tolerance: Patient limited by fatigue;Patient limited by lethargy Patient left: in bed;with call bell/phone within reach;with bed alarm set;with family/visitor present;with SCD's reapplied     Time: [id number] PT Time Calculation (min) (ACUTE ONLY): 35 min  Charges:  $Therapeutic Activity: 8-22 mins                    G Codes:      Tattiana Fakhouri A Cletus Mehlhoff 01/11/2016, 11:23 AM  Mylo RedShauna Rileigh Kawashima, PT, DPT [phone]

## 2016-01-11 NOTE — Clinical Documentation Improvement (Signed)
Neurology  Based on the clinical findings below, please document any associated diagnoses/conditions the patient has or may have.   Cerebral Edema  Brain Herniation  Other  Clinically Undetermined  Please update your documentation within the medical record to reflect your response to this query. Thank you.   Supporting Information:(As per notes) "ICH: right basal ganglia hemorrhage likely due to hypertension"   COMPARISON: CT head 01/08/2016. FINDINGS: CT HEAD Calvarium and skull base: No fracture or destructive lesion. Mastoids and middle ears are grossly clear. Paranasal sinuses: Imaged portions are clear. Orbits: Negative. Brain: Intracerebral hematoma redemonstrated, slight increase in surrounding edema. Slight mass effect on the RIGHT lateral ventricle, with unchanged 3 mm RIGHT-to-LEFT shift. Measurements today are 32 x 59 x 34 mm, corresponding to a volume of 32 cc, essentially unchanged.  Please exercise your independent, professional judgment when responding. A specific answer is not anticipated or expected.  Thank You, Nevin BloodgoodJoan B Chelsy Parrales, RN, BSN, CCDS,Clinical Documentation Specialist:  [phone]  [phone]=[facility] [place]- [facility] Information Management

## 2016-01-11 NOTE — Evaluation (Signed)
Occupational Therapy Evaluation Patient Details Name: Jon [name] MRN: [id number] DOB: 02/03/1956 Today's Date: 01/11/2016    History of Present Illness 60 y.o. male with history of hypertension, alcohol abuse, and liver damage presenting with severe headache, left-sided weakness, slurred speech, and elevated blood pressure. patient found to have ICH: right basal ganglia hemorrhage likely due to hypertension   Clinical Impression   Pts wife reports he was independent with ADLs and mobility PTA. Currently pt overall max assist +2 to perform sit to stand from EOB. Pt presenting with LUE weakness and sensation deficits, no active movement noted. Pt also noted to have R sided visual preference and was unable to identify objects in L visual field even with verbal cues to turn head; pt making up items on L side. Recommending CIR level therapies at this time to maximize independence and safety with ADLs and functional mobility prior to return home. Pt would benefit from continued skilled OT to address established goals.    Follow Up Recommendations  CIR;Supervision/Assistance - 24 hour    Equipment Recommendations  Other (comment) (TBD at next venue)    Recommendations for Other Services Rehab consult     Precautions / Restrictions Precautions Precautions: Fall Precaution Comments: BP paremeters <160 systolic Restrictions Weight Bearing Restrictions: No      Mobility Bed Mobility Overal bed mobility: Needs Assistance;+2 for physical assistance Bed Mobility: Supine to Sit;Sit to Supine     Supine to sit: Max assist;+2 for physical assistance Sit to supine: Max assist;+2 for physical assistance   General bed mobility comments: patient required 2 person max assist to come to EOB, patient was able to use RUE to pull to sitting and RLE movement to get to EOB. No active movement of LUE; some adduction of LLE.  Transfers Overall transfer level: Needs assistance Equipment used:   (back of recliner chair) Transfers: Sit to/from Stand Sit to Stand: Max assist;+2 physical assistance         General transfer comment: +2 max assist to come to standing x3 with LLE blocked out and facilitation of upright postures through hips and chest. Approximation through LUE. Heavy left lateral lean and difficulty managing secretions. No active engagement in LLE.    Balance Overall balance assessment: Needs assistance Sitting-balance support: Feet supported Sitting balance-Leahy Scale: Zero Sitting balance - Comments: required Max assist for sitting EOB, noted pushers behavior with RUE, requires manual cues for facilitation of upright posture. Poor awareness. Postural control: Left lateral lean Standing balance support: During functional activity;Single extremity supported Standing balance-Leahy Scale: Zero Standing balance comment: Requires assist of 2 for standing balance- therapist stabilizing LLE and assisting with trunk as pt with left lateral lean. Able to stand for ~1-2 mins, ~1 min                            ADL Overall ADL's : Needs assistance/impaired Eating/Feeding: Moderate assistance;Sitting   Grooming: Minimal assistance;Sitting;Standing;Cueing for sequencing Grooming Details (indicate cue type and reason): to wipe mouth with increased secretions Upper Body Bathing: Moderate assistance;Sitting;Cueing for sequencing   Lower Body Bathing: Maximal assistance;+2 for physical assistance;Sit to/from stand;Cueing for sequencing   Upper Body Dressing : Moderate assistance;Sitting Upper Body Dressing Details (indicate cue type and reason): Pt able to raise R arm and place into gown with max verbal cues. Lower Body Dressing: Maximal assistance;+2 for physical assistance;Sit to/from stand  General ADL Comments: Only performed sit to stand this session with max assist +2. Educated wife on proper positioning of LUE, sensory input to LUE, and  getting pt to attempt to use LUE.     Vision Vision Assessment?: Vision impaired- to be further tested in functional context Additional Comments: Pt with R preference. Unable to identify objects on L side despite max verbal cues; pt making up objects when looking toward L. Further visual assessment required.   Perception     Praxis      Pertinent Vitals/Pain Pain Assessment: Faces Faces Pain Scale: Hurts a little bit Pain Location: back Pain Descriptors / Indicators: Sore Pain Intervention(s): Monitored during session     Hand Dominance Left   Extremity/Trunk Assessment Upper Extremity Assessment Upper Extremity Assessment: LUE deficits/detail LUE Deficits / Details: flaccid LUE. Reports no sensation. LUE Sensation: decreased light touch;decreased proprioception LUE Coordination: decreased fine motor;decreased gross motor   Lower Extremity Assessment Lower Extremity Assessment: Defer to PT evaluation       Communication Communication Communication: No difficulties   Cognition Arousal/Alertness: Awake/alert Behavior During Therapy: Flat affect;Impulsive Overall Cognitive Status: Impaired/Different from baseline Area of Impairment: Memory;Following commands;Safety/judgement;Attention;Problem solving   Current Attention Level: Focused Memory: Decreased short-term memory Following Commands: Follows one step commands with increased time     Problem Solving: Slow processing;Decreased initiation;Difficulty sequencing;Requires verbal cues;Requires tactile cues General Comments: Left inattention noted. Making up things he sees on his left side based on what he knows is normally near a sink.   General Comments       Exercises       Shoulder Instructions      Home Living Family/patient expects to be discharged to:: Inpatient rehab Living Arrangements: Spouse/significant other Available Help at Discharge: Family Type of Home: House Home Access: Stairs to  enter Entergy CorporationEntrance Stairs-Number of Steps: 2   Home Layout: One level     Bathroom Shower/Tub: Tub/shower unit Shower/tub characteristics: Engineer, building servicesCurtain Bathroom Toilet: Standard     Home Equipment: None          Prior Functioning/Environment Level of Independence: Independent             OT Diagnosis: Generalized weakness;Cognitive deficits;Disturbance of vision;Acute pain;Hemiplegia dominant side;Altered mental status   OT Problem List: Decreased strength;Decreased range of motion;Decreased activity tolerance;Impaired balance (sitting and/or standing);Impaired vision/perception;Decreased coordination;Decreased cognition;Decreased safety awareness;Decreased knowledge of use of DME or AE;Decreased knowledge of precautions;Impaired sensation;Impaired tone;Impaired UE functional use;Pain   OT Treatment/Interventions: Self-care/ADL training;Therapeutic exercise;Neuromuscular education;Energy conservation;DME and/or AE instruction;Therapeutic activities;Cognitive remediation/compensation;Visual/perceptual remediation/compensation;Patient/family education;Balance training    OT Goals(Current goals can be found in the care plan section) Acute Rehab OT Goals Patient Stated Goal: to go to bed OT Goal Formulation: With patient Time For Goal Achievement: 01/25/16 Potential to Achieve Goals: Good ADL Goals Pt Will Perform Grooming: with min guard assist;sitting Pt Will Perform Upper Body Bathing: with min assist;sitting Pt Will Perform Lower Body Bathing: with mod assist;sit to/from stand Pt Will Transfer to Toilet: with mod assist;stand pivot transfer;bedside commode Pt Will Perform Toileting - Clothing Manipulation and hygiene: with mod assist;sit to/from stand  OT Frequency: Min 2X/week   Barriers to D/C:            Co-evaluation PT/OT/SLP Co-Evaluation/Treatment: Yes Reason for Co-Treatment: Complexity of the patient's impairments (multi-system involvement);For patient/therapist  safety PT goals addressed during session: Mobility/safety with mobility;Strengthening/ROM;Balance OT goals addressed during session: ADL's and self-care;Other (comment) (mobility)      End of Session Equipment Utilized During Treatment:  Gait belt;Oxygen Nurse Communication: Mobility status  Activity Tolerance: Patient tolerated treatment well Patient left: in bed;with call bell/phone within reach;with family/visitor present   Time: 1610-9604 OT Time Calculation (min): 34 min Charges:  OT General Charges $OT Visit: 1 Procedure OT Evaluation $OT Eval Moderate Complexity: 1 Procedure G-Codes:     Gaye Alken M.S., OTR/L Pager: [phone]  01/11/2016, 11:59 AM

## 2016-01-11 NOTE — Consult Note (Signed)
Physical Medicine and Rehabilitation Consult Reason for Consult: Right basal ganglia hemorrhage secondary to hypertensive crisis Referring Physician: Dr.Xu   HPI: Jon [name] is a 60 y.o. right handed male with history of hypertension on no prescription medications. Patient is married and worked full-time prior to admission. Wife can assist on discharge. Presented 01/08/2016 with acute onset of headache and left-sided weakness. Initial blood pressure was reportedly elevated. CT of the head showed right basal ganglia hemorrhage with 31 mL volume. 3 mm midline shift. CT angiogram head and neck with no intracranial or extracranial stenosis dissection or aneurysm. Echocardiogram with ejection fraction of 65% no wall motion abnormalities. Patient did not receive TPA secondary to ICH. Subcutaneous heparin for DVT prophylaxis added 01/10/2016 Dysphagia #1 nectar thick liquid diet. Physical therapy evaluation completed 01/10/2016 with recommendations of physical medicine rehabilitation consult.  The patient's wife notes the patient is not motivated by nature and if he could produce "lying in bed all day."   Review of Systems  Constitutional: Negative for fever and chills.  Eyes: Positive for blurred vision. Negative for double vision.  Respiratory: Negative for cough and shortness of breath.   Cardiovascular: Negative for chest pain, palpitations and leg swelling.  Gastrointestinal: Positive for constipation. Negative for nausea and vomiting.  Genitourinary: Negative for dysuria and hematuria.  Musculoskeletal: Positive for myalgias.  Skin: Negative for rash.  Neurological: Positive for weakness and headaches. Negative for seizures and loss of consciousness.  All other systems reviewed and are negative.  Past Medical History  Diagnosis Date  . Hypertension   . Liver damage    History reviewed. No pertinent past surgical history. No pertinent family history on file. Social History:   reports that he has never smoked. He does not have any smokeless tobacco history on file. He reports that he drinks about 7.2 oz of alcohol per week. He reports that he does not use illicit drugs. Allergies:  Allergies  Allergen Reactions  . Gluten Meal Nausea And Vomiting  . Lactose Intolerance (Gi) Nausea And Vomiting  . Other     Tree nuts   . Shellfish Allergy Swelling and Rash   No prescriptions prior to admission    Home: Home Living Family/patient expects to be discharged to:: Inpatient rehab Living Arrangements: Spouse/significant other Type of Home: House  Lives With: Spouse  Functional History: Prior Function Level of Independence: Independent Functional Status:  Mobility: Bed Mobility Overal bed mobility: Needs Assistance, +2 for physical assistance Bed Mobility: Supine to Sit, Sit to Supine Supine to sit: Max assist, +2 for physical assistance Sit to supine: Max assist, +2 for physical assistance General bed mobility comments: patient required 2 person max assist to come to EOB, patient was able to demonstrate some functional use of RUE to pull to sitting and RLE movement to EOB. Patient complained of back pain during transition Transfers Overall transfer level: Needs assistance Transfers: Sit to/from Stand Sit to Stand: Max assist, +2 physical assistance General transfer comment: +2 max assist to come to standing x3 with LLE blocked out and faciliatation of upright postures provided. Face to face with gait belt support and 2 persons required. No active engagement in LLE.      ADL:    Cognition: Cognition Overall Cognitive Status: Impaired/Different from baseline Arousal/Alertness: Lethargic Orientation Level: Oriented X4 Attention: Sustained Sustained Attention: Impaired Sustained Attention Impairment: Verbal basic, Functional basic Awareness: Impaired Awareness Impairment: Emergent impairment, Anticipatory impairment Problem Solving: Impaired Problem  Solving Impairment: Functional  basic Executive Function: Self Monitoring, Landscape architect: Impaired Organizing Impairment: Verbal basic Self Monitoring: Impaired Self Monitoring Impairment: Functional basic Behaviors: Impulsive, Perseveration Safety/Judgment: Impaired Comments: pt perseverative on wanting to go to sleep Cognition Arousal/Alertness: Awake/alert Behavior During Therapy: Restless, Impulsive, Flat affect Overall Cognitive Status: Impaired/Different from baseline Area of Impairment: Attention, Memory, Following commands, Safety/judgement, Awareness, Problem solving Current Attention Level: Focused Following Commands: Follows one step commands with increased time Safety/Judgement: Decreased awareness of safety, Decreased awareness of deficits Awareness: Intellectual Problem Solving: Slow processing, Decreased initiation, Difficulty sequencing, Requires verbal cues, Requires tactile cues General Comments: Patient with noted left side inattention, some disorganized thoughts intermittently throughout session, easily distracted cues for re-direction to task.  Blood pressure 137/77, pulse 81, temperature 99 F (37.2 C), temperature source Oral, resp. rate 18, height 5\' 8"  (1.727 m), weight 90.719 kg (200 lb), SpO2 95 %. Physical Exam  Vitals reviewed. Constitutional: He appears well-developed and well-nourished.  HENT:  Head: Normocephalic and atraumatic.  Eyes: Conjunctivae are normal.  Pupils reactive to light  Neck: Normal range of motion. Neck supple. No thyromegaly present.  Cardiovascular: Normal rate and regular rhythm.   Respiratory: Effort normal and breath sounds normal. No respiratory distress.  GI: Soft. Bowel sounds are normal. He exhibits no distension.  Musculoskeletal: He exhibits no edema or tenderness.  Neurological: He is alert.  Speech is dysarthric but intelligible with apraxia.  He did follow simple commands.  Alert and oriented 3 with  increased time Absent sensation in left upper and left lower extremity Left facial droop Motor: Right upper extremity/right lower extremity 5/5 proximal to distal Left upper extremity/left lower extremity 0/5 proximal to distal  Skin: Skin is warm and dry.  Psychiatric: He has a normal mood and affect. His behavior is normal.    Results for orders placed or performed during the hospital encounter of 01/08/16 (from the past 24 hour(s))  Glucose, capillary     Status: Abnormal   Collection Time: 01/10/16 11:19 AM  Result Value Ref Range   Glucose-Capillary 167 (H) 65 - 99 mg/dL  Glucose, capillary     Status: Abnormal   Collection Time: 01/10/16  4:32 PM  Result Value Ref Range   Glucose-Capillary 106 (H) 65 - 99 mg/dL   Comment 1 Notify RN    Comment 2 Document in Chart   Glucose, capillary     Status: Abnormal   Collection Time: 01/10/16  9:15 PM  Result Value Ref Range   Glucose-Capillary 102 (H) 65 - 99 mg/dL   Comment 1 Notify RN    Comment 2 Document in Chart   Glucose, capillary     Status: None   Collection Time: 01/11/16  6:46 AM  Result Value Ref Range   Glucose-Capillary 92 65 - 99 mg/dL   Comment 1 Notify RN    Comment 2 Document in Chart   CBC     Status: Abnormal   Collection Time: 01/11/16  8:02 AM  Result Value Ref Range   WBC 8.8 4.0 - 10.5 K/uL   RBC 4.20 (L) 4.22 - 5.81 MIL/uL   Hemoglobin 14.3 13.0 - 17.0 g/dL   HCT 16.1 09.6 - 04.5 %   MCV 101.9 (H) 78.0 - 100.0 fL   MCH 34.0 26.0 - 34.0 pg   MCHC 33.4 30.0 - 36.0 g/dL   RDW 40.9 81.1 - 91.4 %   Platelets 134 (L) 150 - 400 K/uL   Ct Angio Head W Or Wo Contrast  01/09/2016  CLINICAL DATA:  Continued surveillance intracerebral hematoma. LEFT-sided weakness. History of hypertension. EXAM: CT ANGIOGRAPHY HEAD AND NECK TECHNIQUE: Multidetector CT imaging of the head and neck was performed using the standard protocol during bolus administration of intravenous contrast. Multiplanar CT image reconstructions  and MIPs were obtained to evaluate the vascular anatomy. Carotid stenosis measurements (when applicable) are obtained utilizing NASCET criteria, using the distal internal carotid diameter as the denominator. CONTRAST:  Isovue 370, 50 mL. COMPARISON:  CT head 01/08/2016. FINDINGS: CT HEAD Calvarium and skull base: No fracture or destructive lesion. Mastoids and middle ears are grossly clear. Paranasal sinuses: Imaged portions are clear. Orbits: Negative. Brain: Intracerebral hematoma redemonstrated, slight increase in surrounding edema. Slight mass effect on the RIGHT lateral ventricle, with unchanged 3 mm RIGHT-to-LEFT shift. Measurements today are 32 x 59 x 34 mm, corresponding to a volume of 32 cc, essentially unchanged. CTA NECK Aortic arch: Standard branching. Imaged portion shows no evidence of aneurysm or dissection. No significant stenosis of the major arch vessel origins. Right carotid system: No evidence of dissection, stenosis (50% or greater) or occlusion. Left carotid system: No evidence of dissection, stenosis (50% or greater) or occlusion. Vertebral arteries: LEFT vertebral dominant. No evidence of dissection, stenosis (50% or greater) or occlusion. Nonvascular soft tissues: Mild spondylosis. No neck masses. Lung apices are clear. No osseous lesions. CTA HEAD Anterior circulation: No significant stenosis, proximal occlusion, aneurysm, or vascular malformation. Posterior circulation: No significant stenosis, proximal occlusion, aneurysm, or vascular malformation. Venous sinuses: As permitted by contrast timing, patent. Anatomic variants: Fetal origin LEFT PCA. Delayed phase:   No abnormal intracranial enhancement. IMPRESSION: Unchanged RIGHT basal ganglia hemorrhage, favored to represent a hypertensive related bleed. Volume today 32 mm, essentially unchanged. Mild 3 mm RIGHT-to-LEFT shift. No intracranial or extracranial stenosis, dissection, aneurysm, or vascular malformation. Electronically Signed    By: Elsie Stain M.D.   On: 01/09/2016 09:48   Ct Angio Neck W Or Wo Contrast  01/09/2016  CLINICAL DATA:  Continued surveillance intracerebral hematoma. LEFT-sided weakness. History of hypertension. EXAM: CT ANGIOGRAPHY HEAD AND NECK TECHNIQUE: Multidetector CT imaging of the head and neck was performed using the standard protocol during bolus administration of intravenous contrast. Multiplanar CT image reconstructions and MIPs were obtained to evaluate the vascular anatomy. Carotid stenosis measurements (when applicable) are obtained utilizing NASCET criteria, using the distal internal carotid diameter as the denominator. CONTRAST:  Isovue 370, 50 mL. COMPARISON:  CT head 01/08/2016. FINDINGS: CT HEAD Calvarium and skull base: No fracture or destructive lesion. Mastoids and middle ears are grossly clear. Paranasal sinuses: Imaged portions are clear. Orbits: Negative. Brain: Intracerebral hematoma redemonstrated, slight increase in surrounding edema. Slight mass effect on the RIGHT lateral ventricle, with unchanged 3 mm RIGHT-to-LEFT shift. Measurements today are 32 x 59 x 34 mm, corresponding to a volume of 32 cc, essentially unchanged. CTA NECK Aortic arch: Standard branching. Imaged portion shows no evidence of aneurysm or dissection. No significant stenosis of the major arch vessel origins. Right carotid system: No evidence of dissection, stenosis (50% or greater) or occlusion. Left carotid system: No evidence of dissection, stenosis (50% or greater) or occlusion. Vertebral arteries: LEFT vertebral dominant. No evidence of dissection, stenosis (50% or greater) or occlusion. Nonvascular soft tissues: Mild spondylosis. No neck masses. Lung apices are clear. No osseous lesions. CTA HEAD Anterior circulation: No significant stenosis, proximal occlusion, aneurysm, or vascular malformation. Posterior circulation: No significant stenosis, proximal occlusion, aneurysm, or vascular malformation. Venous sinuses: As  permitted by contrast timing,  patent. Anatomic variants: Fetal origin LEFT PCA. Delayed phase:   No abnormal intracranial enhancement. IMPRESSION: Unchanged RIGHT basal ganglia hemorrhage, favored to represent a hypertensive related bleed. Volume today 32 mm, essentially unchanged. Mild 3 mm RIGHT-to-LEFT shift. No intracranial or extracranial stenosis, dissection, aneurysm, or vascular malformation. Electronically Signed   By: Elsie StainJohn T Curnes M.D.   On: 01/09/2016 09:48   Dg Chest Port 1 View  01/09/2016  CLINICAL DATA:  Acute right basal ganglia hemorrhage. EXAM: PORTABLE CHEST 1 VIEW COMPARISON:  None. FINDINGS: Cardiomediastinal silhouette is normal. Mediastinal contours appear intact. There is no evidence of focal airspace consolidation, pleural effusion or pneumothorax. Osseous structures are without acute abnormality. Soft tissues are grossly normal. IMPRESSION: No active disease. Electronically Signed   By: Ted Mcalpineobrinka  Dimitrova M.D.   On: 01/09/2016 11:07    Assessment/Plan: Diagnosis: Right basal ganglia hemorrhage Labs and images independently reviewed.  Records reviewed and summated above. Stroke: Continue secondary stroke prophylaxis and Risk Factor Modification listed below:   Antiplatelet therapy:   Blood Pressure Management:  Continue current medication with prn's with permisive HTN per primary team Left sided hemiparesis: fit for orthosis to prevent contractures (resting hand splint for day, wrist cock up splint at night, PRAFO, etc) Motor recovery: Fluoxetine  1. Does the need for close, 24 hr/day medical supervision in concert with the patient's rehab needs make it unreasonable for this patient to be served in a less intensive setting? Yes  2. Co-Morbidities requiring supervision/potential complications: HTN (monitor and provide prns in accordance with increased physical exertion and pain), Dysphagia (cont SLP, advance diet as tolerated), hyponatremia (cont to monitor, treat if  necessary), Thrombocytopenia (< 60,000/mm3 no resistive exercise), post stroke gait disturbance 3. Due to bladder management, safety, skin/wound care, disease management, medication administration and patient education, does the patient require 24 hr/day rehab nursing? Yes 4. Does the patient require coordinated care of a physician, rehab nurse, PT (1-2 hrs/day, 5 days/week), OT (1-2 hrs/day, 5 days/week) and SLP (1-2 hrs/day, 5 days/week) to address physical and functional deficits in the context of the above medical diagnosis(es)? Yes Addressing deficits in the following areas: balance, endurance, locomotion, strength, transferring, bowel/bladder control, bathing, dressing, toileting, cognition, speech, language, swallowing and psychosocial support 5. Can the patient actively participate in an intensive therapy program of at least 3 hrs of therapy per day at least 5 days per week? Potentially 6. The potential for patient to make measurable gains while on inpatient rehab is excellent 7. Anticipated functional outcomes upon discharge from inpatient rehab are min assist and mod assist  with PT, min assist and mod assist with OT, supervision and min assist with SLP. 8. Estimated rehab length of stay to reach the above functional goals is: 20-24 days. 9. Does the patient have adequate social supports and living environment to accommodate these discharge functional goals? Potentially 10. Anticipated D/C setting: SNF 11. Anticipated post D/C treatments: SNF 12. Overall Rehab/Functional Prognosis: good  RECOMMENDATIONS: This patient's condition is appropriate for continued rehabilitative care in the following setting: Pt will likely require significant support at discharge, which his wife may not solely be able to provide. Will need to clarify additional caregiver support at discharge. If this is not present, recommend SNF. Patient has agreed to participate in recommended program. Potentially Note that  insurance prior authorization may be required for reimbursement for recommended care.  Comment: Rehab Admissions Coordinator to follow up.  Maryla MorrowAnkit Patel, MD 01/11/2016

## 2016-01-11 NOTE — Progress Notes (Signed)
STROKE TEAM PROGRESS NOTE   SUBJECTIVE (INTERVAL HISTORY) Wife at bedside. Pt not in good mood, decreased appetite. Stated that he needs O2. His O2 sat at 95-97%. Ordered nasal cannula, will check EKG and troponin trend. Still has dense left hemiplegia.   OBJECTIVE Temp:  [98.3 F (36.8 C)-99 F (37.2 C)] 98.7 F (37.1 C) (06/19 0913) Pulse Rate:  [62-81] 70 (06/19 0913) Cardiac Rhythm:  [-] Normal sinus rhythm (06/19 0700) Resp:  [15-20] 20 (06/19 0913) BP: (124-155)/(69-77) 143/71 mmHg (06/19 0913) SpO2:  [91 %-97 %] 97 % (06/19 0913)  CBC:  Recent Labs Lab 01/08/16 1120  01/10/16 0404 01/11/16 0802  WBC 7.5  < > 9.8 8.8  NEUTROABS 4.5  --   --   --   HGB 14.8  < > 13.8 14.3  HCT 43.7  < > 43.2 42.8  MCV 101.6*  < > 102.6* 101.9*  PLT 138*  < > 147* 134*  < > = values in this interval not displayed.  Basic Metabolic Panel:   Recent Labs Lab 01/10/16 0404 01/11/16 0802  NA 138 134*  K 3.4* 4.0  CL 103 103  CO2 27 22  GLUCOSE 113* 95  BUN 7 8  CREATININE 0.58* 0.54*  CALCIUM 8.9 9.0    Lipid Panel:     Component Value Date/Time   CHOL 250* 01/09/2016 0807   TRIG 212* 01/09/2016 0807   HDL 51 01/09/2016 0807   CHOLHDL 4.9 01/09/2016 0807   VLDL 42* 01/09/2016 0807   LDLCALC 157* 01/09/2016 0807   HgbA1c:  Lab Results  Component Value Date   HGBA1C 5.5 01/09/2016   Urine Drug Screen: No results found for: LABOPIA, COCAINSCRNUR, LABBENZ, AMPHETMU, THCU, LABBARB    IMAGING I have personally reviewed the radiological images below and agree with the radiology interpretations.  Ct Head Wo Contrast 01/08/2016   Right basal ganglia hemorrhage with 31 cc volume. 3 mm midline shift.   CTA head and neck  01/09/2016 Unchanged RIGHT basal ganglia hemorrhage, favored to represent a hypertensive related bleed.  Volume today 32 mm, essentially unchanged. Mild 3 mm RIGHT-to-LEFT shift. No intracranial or extracranial stenosis, dissection, aneurysm, or vascular  malformation.  TTE  01/09/2016 - Left ventricle: The cavity size was normal. Wall thickness was increased in a pattern of moderate LVH. Systolic function was normal. The estimated ejection fraction was in the range of 60% to 65%.  Wall motion was normal; there were no regional wall motion abnormalities. Left ventricular diastolic function parameters were normal. - Pulmonary arteries: PA peak pressure: 52 mm Hg (S). Impressions:   No cardiac source of emboli was indentified.   PHYSICAL EXAM General - Well nourished, well developed, in no apparent distress.  Ophthalmologic - Fundi not visualized due to eye movement.  Cardiovascular - Regular rate and rhythm.  Mental Status -  Level of arousal and orientation to time, place, and person were intact. Language including expression, naming, repetition, comprehension was assessed and found intact, moderate dysarthria Fund of Knowledge was assessed and was intact.  Cranial Nerves II - XII - II - Visual field intact OU. III, IV, VI - Extraocular movements intact. V - Facial sensation decreased on the left. VII - left facial droop. VIII - Hearing & vestibular intact bilaterally. X - Palate elevates symmetrically. XI - Chin turning & shoulder shrug intact bilaterally. XII - Tongue protrusion intact.  Motor Strength - The patient's strength was 0/5 LUE and LLE, 5/5 RUE and RLE and pronator  drift was absent.  Bulk was normal and fasciculations were absent.   Motor Tone - Muscle tone was assessed at the neck and appendages and was normal.  Reflexes - The patient's reflexes were 1+ in all extremities and he had no pathological reflexes.  Sensory - Light touch, temperature/pinprick were assessed and were decreased on the left, 10% of right.    Coordination - The patient had normal movements in the right hand with no ataxia or dysmetria.  Tremor was absent.  Gait and Station - deferred due to weakness   ASSESSMENT/PLAN [name] is  a 60 y.o. male with history of hypertension, alcohol abuse, and liver damage presenting with severe headache, left-sided weakness, slurred speech, and elevated blood pressure. He did not receive IV t-PA due to right basal ganglia hemorrhage.  ICH:  right BG hemorrhage likely due to hypertension  Resultant  Left hemiplegia, hemisensory deficit, and dysarthria  CT head showed left BG ICH  CTA of head and neck - Unchanged RIGHT basal ganglia hemorrhage.  2D Echo  EF 60 - 65%   LDL 157  HgbA1c 5.5  VTE prophylaxis - SCDs and heparin subq  DIET - DYS 1 Room service appropriate?: Yes; Fluid consistency:: Nectar Thick - pending speech eval  No antithrombotic prior to admission, now on No antithrombotic.  Ongoing aggressive stroke risk factor management  Therapy recommendations: CIR. Consult in place  Disposition: Pending  Hypertension  Stable within goal - off cardene now  BP goal < 160  On home metoprolol  Add amlodipine  Hyperlipidemia  Home meds: Pravachol 40 mg daily - not resumed secondary to hemorrhage.  LDL 157, goal < 70  Continue statin at discharge  Other Stroke Risk Factors  ETOH use, advised to drink no more than 1 - 2 drinks a day  Obesity, Body mass index is 30.42 kg/(m^2)., recommend weight loss, diet and exercise as appropriate   Other Active Problems  Lack of appetite - put on IVF   Elevated glucose - CBG monitoring  Hypokalemia - 3.4 - supplement  SOB - on O2, trop negative - likely due to anxiety   Hospital day # 3   Jon PlanJindong Betzayda Braxton, MD PhD Stroke Neurology 01/11/2016 11:32 AM  To contact Stroke Continuity provider, please refer to [email or web address]. After hours, contact General Neurology

## 2016-01-12 LAB — BASIC METABOLIC PANEL
ANION GAP: 11 (ref 5–15)
BUN: 10 mg/dL (ref 6–20)
CO2: 20 mmol/L — ABNORMAL LOW (ref 22–32)
Calcium: 9 mg/dL (ref 8.9–10.3)
Chloride: 104 mmol/L (ref 101–111)
Creatinine, Ser: 0.62 mg/dL (ref 0.61–1.24)
Glucose, Bld: 85 mg/dL (ref 65–99)
POTASSIUM: 3.7 mmol/L (ref 3.5–5.1)
SODIUM: 135 mmol/L (ref 135–145)

## 2016-01-12 LAB — GLUCOSE, CAPILLARY
GLUCOSE-CAPILLARY: 96 mg/dL (ref 65–99)
GLUCOSE-CAPILLARY: 114 mg/dL — AB (ref 65–99)
Glucose-Capillary: 96 mg/dL (ref 65–99)
Glucose-Capillary: 97 mg/dL (ref 65–99)

## 2016-01-12 LAB — CBC
HCT: 42 % (ref 39.0–52.0)
HEMOGLOBIN: 14.4 g/dL (ref 13.0–17.0)
MCH: 34.1 pg — ABNORMAL HIGH (ref 26.0–34.0)
MCHC: 34.3 g/dL (ref 30.0–36.0)
MCV: 99.5 fL (ref 78.0–100.0)
PLATELETS: 147 10*3/uL — AB (ref 150–400)
RBC: 4.22 MIL/uL (ref 4.22–5.81)
RDW: 12 % (ref 11.5–15.5)
WBC: 8.2 10*3/uL (ref 4.0–10.5)

## 2016-01-12 MED ORDER — METOPROLOL TARTRATE 50 MG PO TABS
50.0000 mg | ORAL_TABLET | Freq: Two times a day (BID) | ORAL | Status: DC
  Administered 2016-01-13: 50 mg via ORAL
  Filled 2016-01-12: qty 1

## 2016-01-12 MED ORDER — PRAVASTATIN SODIUM 40 MG PO TABS
40.0000 mg | ORAL_TABLET | Freq: Every day | ORAL | Status: DC
  Administered 2016-01-13: 40 mg via ORAL
  Filled 2016-01-12: qty 1

## 2016-01-12 MED ORDER — ENSURE ENLIVE PO LIQD
237.0000 mL | Freq: Two times a day (BID) | ORAL | Status: DC
  Administered 2016-01-13 – 2016-01-14 (×3): 237 mL via ORAL

## 2016-01-12 NOTE — Clinical Social Work Placement (Signed)
   CLINICAL SOCIAL WORK PLACEMENT  NOTE  Date:  01/12/2016  Patient Details  Name: Jon [name] MRN: [id number] Date of Birth: 11/21/1955  Clinical Social Work is seeking post-discharge placement for this patient at the Skilled  Nursing Facility level of care (*CSW will initial, date and re-position this form in  chart as items are completed):  Yes   Patient/family provided with [facility] Clinical Social Work Department's list of facilities offering this level of care within the geographic area requested by the patient (or if unable, by the patient's family).  Yes   Patient/family informed of their freedom to choose among providers that offer the needed level of care, that participate in Medicare, Medicaid or managed care program needed by the patient, have an available bed and are willing to accept the patient.  Yes   Patient/family informed of [facility]'s ownership interest in [facility] and [facility], as well as of the fact that they are under no obligation to receive care at these facilities.  PASRR submitted to EDS on 01/12/16     PASRR number received on 01/12/16     Existing PASRR number confirmed on       FL2 transmitted to all facilities in geographic area requested by pt/family on 01/12/16     FL2 transmitted to all facilities within larger geographic area on       Patient informed that his/her managed care company has contracts with or will negotiate with certain facilities, including the following:            Patient/family informed of bed offers received.  Patient chooses bed at       Physician recommends and patient chooses bed at      Patient to be transferred to   on  .  Patient to be transferred to facility by       Patient family notified on   of transfer.  Name of family member notified:        PHYSICIAN Please sign FL2     Additional Comment:    _______________________________________________ Vaughan BrownerNixon, Justyne Roell A, LCSW 01/12/2016,  10:17 AM

## 2016-01-12 NOTE — Clinical Social Work Note (Signed)
Clinical Social Worker met with patient's wife at bedside and explained potential need for short-term rehab placement. Patient's wife was hopeful for IP REHAB however understands that patient may need more assistance/ time in rehab setting. CSW provided SNF list and reviewed SNF's that were contracted with [employer].   CSW also presented bed offers. Spouse chooses bed at [facility] and [facility]. Facility notified and will initiate authorization.   Patient polite however asked that staff discussed care plans outside of the room. Patient did not participate in assessment.   CSW remains available as needed.   Glendon Axe, MSW, LCSWA [phone] 01/12/2016 10:58 AM

## 2016-01-12 NOTE — Progress Notes (Signed)
STROKE TEAM PROGRESS NOTE   SUBJECTIVE (INTERVAL HISTORY) Wife at bedside. Pt mood as well as appetite improved. Troponin negative. Still has dense left hemiplegia. Pending SNF vs. CIR.    OBJECTIVE Temp:  [97.6 F (36.4 C)-99.3 F (37.4 C)] 99.3 F (37.4 C) (06/20 1802) Pulse Rate:  [62-74] 74 (06/20 1802) Cardiac Rhythm:  [-] Sinus bradycardia (06/20 0700) Resp:  [18-20] 20 (06/20 1802) BP: (133-162)/(62-78) 162/78 mmHg (06/20 1802) SpO2:  [97 %-100 %] 99 % (06/20 1802)  CBC:  Recent Labs Lab 01/08/16 1120  01/11/16 0802 01/12/16 0635  WBC 7.5  < > 8.8 8.2  NEUTROABS 4.5  --   --   --   HGB 14.8  < > 14.3 14.4  HCT 43.7  < > 42.8 42.0  MCV 101.6*  < > 101.9* 99.5  PLT 138*  < > 134* 147*  < > = values in this interval not displayed.  Basic Metabolic Panel:   Recent Labs Lab 01/11/16 0802 01/12/16 0635  NA 134* 135  K 4.0 3.7  CL 103 104  CO2 22 20*  GLUCOSE 95 85  BUN 8 10  CREATININE 0.54* 0.62  CALCIUM 9.0 9.0    Lipid Panel:     Component Value Date/Time   CHOL 250* 01/09/2016 0807   TRIG 212* 01/09/2016 0807   HDL 51 01/09/2016 0807   CHOLHDL 4.9 01/09/2016 0807   VLDL 42* 01/09/2016 0807   LDLCALC 157* 01/09/2016 0807   HgbA1c:  Lab Results  Component Value Date   HGBA1C 5.5 01/09/2016   Urine Drug Screen: No results found for: LABOPIA, COCAINSCRNUR, LABBENZ, AMPHETMU, THCU, LABBARB    IMAGING I have personally reviewed the radiological images below and agree with the radiology interpretations.  Ct Head Wo Contrast 01/08/2016   Right basal ganglia hemorrhage with 31 cc volume. 3 mm midline shift.   CTA head and neck  01/09/2016 Unchanged RIGHT basal ganglia hemorrhage, favored to represent a hypertensive related bleed.  Volume today 32 mm, essentially unchanged. Mild 3 mm RIGHT-to-LEFT shift. No intracranial or extracranial stenosis, dissection, aneurysm, or vascular malformation.  TTE  01/09/2016 - Left ventricle: The cavity size  was normal. Wall thickness was increased in a pattern of moderate LVH. Systolic function was normal. The estimated ejection fraction was in the range of 60% to 65%.  Wall motion was normal; there were no regional wall motion abnormalities. Left ventricular diastolic function parameters were normal. - Pulmonary arteries: PA peak pressure: 52 mm Hg (S). Impressions:   No cardiac source of emboli was indentified.   PHYSICAL EXAM General - Well nourished, well developed, in no apparent distress.  Ophthalmologic - Fundi not visualized due to eye movement.  Cardiovascular - Regular rate and rhythm.  Mental Status -  Level of arousal and orientation to time, place, and person were intact. Language including expression, naming, repetition, comprehension was assessed and found intact, moderate dysarthria Fund of Knowledge was assessed and was intact.  Cranial Nerves II - XII - II - Visual field intact OU. III, IV, VI - Extraocular movements intact. V - Facial sensation decreased on the left. VII - left facial droop. VIII - Hearing & vestibular intact bilaterally. X - Palate elevates symmetrically. XI - Chin turning & shoulder shrug intact bilaterally. XII - Tongue protrusion intact.  Motor Strength - The patient's strength was 0/5 LUE and LLE, 5/5 RUE and RLE and pronator drift was absent.  Bulk was normal and fasciculations were absent.  Motor Tone - Muscle tone was assessed at the neck and appendages and was normal.  Reflexes - The patient's reflexes were 1+ in all extremities and he had no pathological reflexes.  Sensory - Light touch, temperature/pinprick were assessed and were decreased on the left, 10% of right.    Coordination - The patient had normal movements in the right hand with no ataxia or dysmetria.  Tremor was absent.  Gait and Station - deferred due to weakness   ASSESSMENT/PLAN Jon [name] is a 60 y.o. male with history of hypertension, alcohol abuse, and  liver damage presenting with severe headache, left-sided weakness, slurred speech, and elevated blood pressure. He did not receive IV t-PA due to right basal ganglia hemorrhage.  ICH:  right BG hemorrhage likely due to hypertension  Resultant  Left hemiplegia, hemisensory deficit, and dysarthria  CT head showed left BG ICH  CTA of head and neck - Unchanged RIGHT basal ganglia hemorrhage.  2D Echo  EF 60 - 65%   LDL 157  HgbA1c 5.5  VTE prophylaxis - SCDs and heparin subq  DIET - DYS 1 Room service appropriate?: Yes; Fluid consistency:: Nectar Thick  No antithrombotic prior to admission, now on No antithrombotic.  Ongoing aggressive stroke risk factor management  Therapy recommendations: CIR. Consult in place  Disposition: Pending  Mild cerebral edema  Due to right BG hemorrhage  Repeat CT stable  No need hypertonic saline at this time  Hypertension  Stable within goal   BP goal < 160 On home metoprolol Add amlodipine  Hyperlipidemia  Home meds: Pravachol 40 mg daily  LDL 157, goal < 70  Resumed home meds  Continue statin at discharge  Other Stroke Risk Factors  ETOH use, advised to drink no more than 1 - 2 drinks a day  Obesity, Body mass index is 30.42 kg/(m^2)., recommend weight loss, diet and exercise as appropriate   Other Active Problems   Elevated glucose - CBG monitoring  Hypokalemia - 3.4 - supplement  Hospital day # 4   Marvel PlanJindong Darry Kelnhofer, MD PhD Stroke Neurology 01/12/2016 6:32 PM  To contact Stroke Continuity provider, please refer to [email or web address]. After hours, contact General Neurology

## 2016-01-12 NOTE — NC FL2 (Signed)
[place] MEDICAID FL2 LEVEL OF CARE SCREENING TOOL     IDENTIFICATION  Patient Name: Jon [name] Birthdate: 05-19-1956 Sex: male Admission Date (Current Location): 01/08/2016  [facility] [place]   Facility and Address:  [facility] [place], [address], [place], [place] [address]      Provider Number: [id number]  Attending Physician Name and Address:  Marvel PlanJindong Xu, MD  Relative Name and Phone Number:       Current Level of Care: Hospital Recommended Level of Care: Skilled Nursing Facility Prior Approval Number:    Date Approved/Denied:   PASRR Number:   [id number] [id number]  Discharge Plan: SNF    Current Diagnoses: Patient Active Problem List   Diagnosis Date Noted  . Left hemiparesis (HCC)   . Essential hypertension   . Dysphagia, post-stroke   . Gait disturbance, post-stroke   . Hyponatremia   . Thrombocytopenia (HCC)   . Hemorrhagic stroke (HCC) 01/08/2016  . ICH (intracerebral hemorrhage) (HCC) 01/08/2016    Orientation RESPIRATION BLADDER Height & Weight     Self, Time, Situation, Place  Normal Incontinent Weight: 200 lb (90.719 kg) Height:  5\' 8"  (172.7 cm)  BEHAVIORAL SYMPTOMS/MOOD NEUROLOGICAL BOWEL NUTRITION STATUS   (NONE )  (NONE ) Continent Diet (DYS 1)  AMBULATORY STATUS COMMUNICATION OF NEEDS Skin   Extensive Assist Verbally Normal                       Personal Care Assistance Level of Assistance  Bathing, Feeding, Dressing Bathing Assistance: Maximum assistance Feeding assistance: Independent Dressing Assistance: Maximum assistance     Functional Limitations Info  Sight, Hearing, Speech Sight Info: Adequate Hearing Info: Adequate Speech Info: Adequate    SPECIAL CARE FACTORS FREQUENCY  PT (By licensed PT), OT (By licensed OT)     PT Frequency: 4 OT Frequency: 2            Contractures      Additional Factors Info  Code Status, Allergies Code Status Info: FULL CODE  Allergies Info:  Gluten Meal, Lactose Intolerance (Gi), Other, Shellfish Allergy           Current Medications (01/12/2016):  This is the current hospital active medication list Current Facility-Administered Medications  Medication Dose Route Frequency Provider Last Rate Last Dose  . 0.9 %  sodium chloride infusion   Intravenous Continuous Marvel PlanJindong Xu, MD 75 mL/hr at 01/11/16 2031    . acetaminophen (TYLENOL) tablet 650 mg  650 mg Oral Q4H PRN Caryl PinaEric Lindzen, MD   650 mg at 01/11/16 2031  . acetaminophen-codeine (TYLENOL #3) 300-30 MG per tablet 1-2 tablet  1-2 tablet Oral Q4H PRN Caryl PinaEric Lindzen, MD   2 tablet at 01/12/16 0747  . amLODipine (NORVASC) tablet 10 mg  10 mg Oral Daily Marvel PlanJindong Xu, MD   10 mg at 01/11/16 1033  . folic acid (FOLVITE) tablet 1 mg  1 mg Oral Daily Marvel PlanJindong Xu, MD   1 mg at 01/11/16 1032  . heparin injection 5,000 Units  5,000 Units Subcutaneous Q8H Marvel PlanJindong Xu, MD   5,000 Units at 01/12/16 0547  . labetalol (NORMODYNE,TRANDATE) injection 10-40 mg  10-40 mg Intravenous Q2H PRN Marvel PlanJindong Xu, MD      . metoprolol succinate (TOPROL-XL) 24 hr tablet 100 mg  100 mg Oral BID Marvel PlanJindong Xu, MD   100 mg at 01/11/16 2032  . multivitamin with minerals tablet 1 tablet  1 tablet Oral Daily Marvel PlanJindong Xu, MD  1 tablet at 01/11/16 1033  . ondansetron (ZOFRAN) injection 4 mg  4 mg Intravenous Q8H PRN David L Rinehuls, PA-C      . pantoprazole (PROTONIX) EC tablet 40 mg  40 mg Oral Daily Marvel Plan, MD   40 mg at 01/11/16 1033  . RESOURCE THICKENUP CLEAR   Oral PRN Marvel Plan, MD      . senna-docusate (Senokot-S) tablet 1 tablet  1 tablet Oral BID Caryl Pina, MD   1 tablet at 01/11/16 1033  . thiamine (VITAMIN B-1) tablet 100 mg  100 mg Oral Daily Marvel Plan, MD   100 mg at 01/11/16 1033     Discharge Medications: Please see discharge summary for a list of discharge medications.  Relevant Imaging Results:  Relevant Lab Results:   Additional Information SSN [id number]  Derenda Fennel, MSW, LCSWA [phone] 01/12/2016 9:17 AM

## 2016-01-12 NOTE — Progress Notes (Signed)
Inpatient Rehabilitation  I met with the patient and his wife [name] to discuss the recommendation for IP Rehab.  Pt. slept much of time I was in the room.  I discussed our program with wife and provided informational booklets.  I answered her questions.  She states she would prefer that pt. come to IP Rehab.  I have initiated insurance authorization process and have sent clinicals.  Admission is dependent on insurance authorization and bed availability.  I will follow up when I have an insurance decision.  Please call if questions.  [place] Admissions Coordinator Cell [phone] Office [phone]

## 2016-01-12 NOTE — Clinical Social Work Placement (Signed)
   CLINICAL SOCIAL WORK PLACEMENT  NOTE  Date:  01/12/2016  Patient Details  Name: Jon [name] MRN: [id number] Date of Birth: 13-Jun-1956  Clinical Social Work is seeking post-discharge placement for this patient at the Skilled  Nursing Facility level of care (*CSW will initial, date and re-position this form in  chart as items are completed):  Yes   Patient/family provided with [facility] Clinical Social Work Department's list of facilities offering this level of care within the geographic area requested by the patient (or if unable, by the patient's family).  Yes   Patient/family informed of their freedom to choose among providers that offer the needed level of care, that participate in Medicare, Medicaid or managed care program needed by the patient, have an available bed and are willing to accept the patient.  Yes   Patient/family informed of [facility]'s ownership interest in [facility] and [facility], as well as of the fact that they are under no obligation to receive care at these facilities.  PASRR submitted to EDS on 01/12/16     PASRR number received on 01/12/16     Existing PASRR number confirmed on       FL2 transmitted to all facilities in geographic area requested by pt/family on 01/12/16     FL2 transmitted to all facilities within larger geographic area on       Patient informed that his/her managed care company has contracts with or will negotiate with certain facilities, including the following:        Yes   Patient/family informed of bed offers received.  [facility]  [facility]     Physician recommends and patient chooses bed at      Patient [facility] on  .  Patient to be transferred to facility by       Patient family notified on   of transfer.  Name of family member notified:        PHYSICIAN Please sign FL2     Additional Comment:     _______________________________________________ Derenda FennelBashira Vista Sawatzky, MSW, LCSWA [phone] 01/12/2016 2:00 PM

## 2016-01-12 NOTE — Clinical Social Work Note (Signed)
Clinical Social Work Assessment  Patient Details  Name: Jon [name] MRN: [id number] Date of Birth: [birth year]  Date of referral:  01/12/16               Reason for consult:  Facility Placement, Discharge Planning                Permission sought to share information with:  Family Supports, Customer service manager, Case Optician, dispensing granted to share information::  Yes, Verbal Permission Granted  Name::      Jon [name] )  Agency::   (SNF's )  Relationship::   (Wife )  Contact Information:   [phone])  Housing/Transportation Living arrangements for the past 2 months:  Single Family Home Source of Information:  Patient Patient Interpreter Needed:  None Criminal Activity/Legal Involvement Pertinent to Current Situation/Hospitalization:  No - Comment as needed Significant Relationships:  Spouse Lives with:  Spouse Do you feel safe going back to the place where you live?  No Need for family participation in patient care:  Yes (Comment)  Care giving concerns:  PT currently recommending CIR. Per Rehab MD, it is likely that wife is unable to provide 24/7 care once patient is d/c'ed from [facility].    Social Worker assessment / plan:  Holiday representative met with patient at bedside in reference to post-acute placement for SNF. CSW introduced CSW role and SNF process. Patient stated that he is agreeable to SNF in the event he is not able to d/c to IP REHAB. Patient further reported that he was tired and requested that I contact his wife, Jon [name] with further details. CSW attempted to contact pt's wife and left a message for a returned phone call. No further concerns reported at this time. FL-2 completed and faxed via [place]. CSW will continue to follow pt and pt's family for continued support and to facilitate pt's d/c need once medically stable.   Employment status:  Therapist, music:  Managed Care PT Recommendations:  Inpatient Rehab Consult Information  / Referral to community resources:  Success  Patient/Family's Response to care:  Pt a/o x4. Patient reported that he is trying to get some rest and requested for CSW to contact wife.   Patient/Family's Understanding of and Emotional Response to Diagnosis, Current Treatment, and Prognosis:  Unsure of patient/family's understanding of medical work up.   Emotional Assessment Appearance:  Appears younger than stated age Attitude/Demeanor/Rapport:   (Polite ) Affect (typically observed):  Accepting, Pleasant, Calm Orientation:  Oriented to Situation, Oriented to  Time, Oriented to Place, Oriented to Self Alcohol / Substance use:  Not Applicable Psych involvement (Current and /or in the community):  No (Comment)  Discharge Needs  Concerns to be addressed:  Care Coordination Readmission within the last 30 days:  No Current discharge risk:  Dependent with Mobility Barriers to Discharge:  Continued Medical Work up, [employer], MSW, [place] [phone] 01/12/2016 10:17 AM

## 2016-01-12 NOTE — Clinical Social Work Note (Signed)
[facility] once medically stable for d/c. Potential d/c for tomorrow if stable and eating well per MD.  CSW remains available as needed.   Derenda FennelBashira Anacaren Kohan, MSW, LCSWA [phone] 01/12/2016 1:58 PM

## 2016-01-12 NOTE — Progress Notes (Signed)
Speech Language Pathology Treatment: Dysphagia;Cognitive-Linquistic  Patient Details Name: Jon [name] MRN: [id number] DOB: Dec 15, 1955 Today's Date: 01/12/2016 Time: 0940-1006 SLP Time Calculation (min) (ACUTE ONLY): 26 min  Assessment / Plan / Recommendation Clinical Impression  Pt seen to assess po tolerance and for skilled cognitive linguistic intervention.  Pt sitting in bed, appearing disheveled with eyes closed.   He reluctantly was willing to accept magic cup and Ensure in small amounts (despite lactose intolerance).  Good tolerance with mild delay in oral transiting and minimal left sided labial residual.  Pt reported being satisfied with items as they are "cold".   He did not consume ANY of his breakfast = which will be very concerning for nutritional support.    SLP also focused on cognitive lingustic goals =  Areas of attention - *left inattention and compensation was focus.  Using hand over hand cues to locate items on left side of tray helpful.  SLP intentionally set up items on left to force pt to attend.  Initially pt required total cues fading to moderate verbal cues for left attention.  Using teach back and pt return demonstration, education ongoing.   Pt recalled SLP providing ice pack for him within 45 seconds - as this was motivating for pt to help decrease pain.   Pt demonstrated higher level cognition during session, stating "GPS" when asked how he would locate items on left.   He will continue to benefit from aggressive SLP for cognitive linguistic and dysphagia tx to maximize his rehab skills.  Pt benefited from cues to participate during session stating he wanted to "rest".    HPI HPI: 60 yo [facility] severe headache over right eye, left sided weakness  after intercourse with his wife.  Wife found him in down in bathroom diaphoretic.  Pt found to have a right basal hemorrhagic cva = + hematoma.  Pt PMH + for urinary retention, gall bladder issues, IBS and  reflux.  Pt failed an RNSSS and speech/swallow eval ordered. CXR 6/17 negative.  Pt reports he is LEFT HANDED.  He denies dysphagia prior to admission.       SLP Plan  Continue with current plan of care     Recommendations  Diet recommendations: Dysphagia 1 (puree);Nectar-thick liquid Liquids provided via: Cup Medication Administration: Crushed with puree Supervision: Patient able to self feed;Full supervision/cueing for compensatory strategies Compensations: Minimize environmental distractions;Small sips/bites;Slow rate;Lingual sweep for clearance of pocketing Postural Changes and/or Swallow Maneuvers: Seated upright 90 degrees;Upright 30-60 min after meal             Oral Care Recommendations: Oral care BID;Oral care before and after PO Follow up Recommendations: Inpatient Rehab Plan: Continue with current plan of care     GO                Donavan Burnetamara Refugio Vandevoorde, MS [facility] SLP [phone]

## 2016-01-12 NOTE — Progress Notes (Signed)
Physical Therapy Treatment Patient Details Name: Jon [name] MRN: [id number] DOB: 12/11/1955 Today's Date: 01/12/2016    History of Present Illness 60 y.o. male with history of hypertension, alcohol abuse, and liver damage presenting with severe headache, left-sided weakness, slurred speech, and elevated blood pressure. patient found to have ICH: right basal ganglia hemorrhage likely due to hypertension    PT Comments    Patient more interactive and awake during today's session. Eager to get to the [facility]. Continues to have left inattention but able to gaze left and identify items on left with cues. Tolerated squat pivot transfer x2 with Max-total A of 2 towards right side. Poor sitting balance but able to self correct with verbal cues but difficulty maintaining. Less pushing noted today in sitting. Education with wife to sit on left side and have pt attend to items/people on left side. Will continue to follow.   Follow Up Recommendations  CIR     Equipment Recommendations  Wheelchair (measurements PT);Wheelchair cushion (measurements PT)    Recommendations for Other Services       Precautions / Restrictions Precautions Precautions: Fall Precaution Comments: BP paremeters <160 systolic Restrictions Weight Bearing Restrictions: No    Mobility  Bed Mobility Overal bed mobility: Needs Assistance Bed Mobility: Supine to Sit     Supine to sit: Max assist;+2 for physical assistance     General bed mobility comments: patient required 2 person max assist to come to EOB, patient was able to reach with RUE to pull to sitting and requires cues to use right heel to scoot bottom to EOB, assist with LLE. No active movement of LUE.  Transfers Overall transfer level: Needs assistance   Transfers: Sit to/from [employer] Transfers Sit to Stand: Max assist;+2 physical assistance   Squat pivot transfers: +2 physical assistance;Max assist;Total assist     General transfer  comment: + 2 Max A squat pivot transfer to [facility] with manual/verbal cues for hand placement. Assist managing LLE during transfer. Squat pivot transfer from [facility] to chair. Heavy left lateral lean.  Ambulation/Gait                 Stairs            Wheelchair Mobility    Modified Rankin (Stroke Patients Only) Modified Rankin (Stroke Patients Only) Pre-Morbid Rankin Score: No symptoms Modified Rankin: Severe disability     Balance Overall balance assessment: Needs assistance Sitting-balance support: Feet supported;Single extremity supported Sitting balance-Leahy Scale: Zero Sitting balance - Comments: required Max assist for sitting EOB, less pushing noted today, requires manual cues for facilitation of upright posture. Poor awareness. Heavy left lateral lean, abel to self correct to midline with cues but not able to maintain. Postural control: Left lateral lean Standing balance support: During functional activity Standing balance-Leahy Scale: Zero                      Cognition Arousal/Alertness: Awake/alert Behavior During Therapy: Flat affect;Impulsive Overall Cognitive Status: Impaired/Different from baseline Area of Impairment: Attention;Awareness;Problem solving;Safety/judgement;Following commands   Current Attention Level: Sustained Memory: Decreased short-term memory Following Commands: Follows one step commands with increased time Safety/Judgement: Decreased awareness of safety;Decreased awareness of deficits Awareness:  ("Can I walk to the bathroom?" ) Problem Solving: Slow processing;Decreased initiation;Difficulty sequencing;Requires verbal cues;Requires tactile cues General Comments: Left inattention noted but able to gaze right past midline on a few occasions with constant cues.     Exercises      General Comments  Pertinent Vitals/Pain Pain Assessment: No/denies pain    Home Living                      Prior Function             PT Goals (current goals can now be found in the care plan section) Progress towards PT goals: Progressing toward goals (slowly)    Frequency  Min 4X/week    PT Plan Current plan remains appropriate    Co-evaluation             End of Session Equipment Utilized During Treatment: Gait belt Activity Tolerance: Patient tolerated treatment well Patient left: in chair;with call bell/phone within reach;with chair alarm set;with family/visitor present     Time: 1322-1401 PT Time Calculation (min) (ACUTE ONLY): 39 min  Charges:  $Therapeutic Activity: 23-37 mins $Neuromuscular Re-education: 8-22 mins                    G Codes:      Allysson Rinehimer A Sarath Privott 01/12/2016, 2:55 PM Mylo RedShauna Eryk Beavers, PT, DPT [phone]

## 2016-01-12 NOTE — Progress Notes (Signed)
Occupational Therapy Treatment Patient Details Name: Tawanna CoolerGeorge D Lysaght MRN: [id number] DOB: 12/06/1955 Today's Date: 01/12/2016    History of present illness 60 y.o. male with history of hypertension, alcohol abuse, and liver damage presenting with severe headache, left-sided weakness, slurred speech, and elevated blood pressure. patient found to have ICH: right basal ganglia hemorrhage likely due to hypertension   OT comments  Wife present for session with focus on proper positioning of LUE in sitting to reduce injury, increasing awareness to LUE/environment and facilitating midline postural control in sitting. Wife asking questions about "how long her husband's rehab process will take". Wife expressed concerns of her husband's ability to recover.  Feel pt is appropriate for CIR. He will need extensive therapy and most likely 24/7 assistance after D/C. Wife would benefit from counseling/support  regarding expectations of recovery. Will continue to follow. Recommend staff use maximove to mobilize pt.   Follow Up Recommendations  CIR;Supervision/Assistance - 24 hour    Equipment Recommendations  Other (comment)    Recommendations for Other Services Rehab consult    Precautions / Restrictions Precautions Precautions: Fall Precaution Comments: BP paremeters <160 systolic Restrictions Weight Bearing Restrictions: No       Mobility Bed Mobility Overal bed mobility: Needs Assistance Bed Mobility: Supine to Sit     Supine to sit: Max assist;+2 for physical assistance     General bed mobility comments: OOB in chair  Transfers Overall transfer level: Needs assistance   Transfers: Sit to/from Stand;Squat Pivot Transfers Sit to Stand: Max assist;+2 physical assistance   Squat pivot transfers: +2 physical assistance;Max assist;Total assist     General transfer comment: + 2 Max A squat pivot [facility] to [facility] with manual/verbal cues for hand placement. Assist managing LLE during  transfer. Squat pivot [facility] chair. Heavy left lateral lean.    Balance Overall balance assessment: Needs assistance Sitting-balance support: Feet supported;Single extremity supported Sitting balance-Leahy Scale: Zero Sitting balance - Comments: required Max assist for sitting EOB, less pushing noted today, requires manual cues for facilitation of upright posture. Poor awareness. Heavy left lateral lean, abel to self correct to midline with cues but not able to maintain. Postural control: Left lateral lean Standing balance support: During functional activity Standing balance-Leahy Scale: Zero                     ADL Overall ADL's : Needs assistance/impaired     Grooming: Wash/dry hands;Wash/dry face;Oral care;Minimal assistance;Sitting                                 General ADL Comments: Facilitating trunk control during funcitonal reaching tasks. L bias. Poor awraeness of midline      Vision                     Perception     Praxis      Cognition   Behavior During Therapy: Flat affect;Impulsive Overall Cognitive Status: Impaired/Different from baseline Area of Impairment: Attention;Awareness;Problem solving;Safety/judgement;Following commands   Current Attention Level: Sustained Memory: Decreased short-term memory  Following Commands: Follows one step commands with increased time Safety/Judgement: Decreased awareness of safety;Decreased awareness of deficits Awareness: Emergent Problem Solving: Slow processing;Decreased initiation;Difficulty sequencing;Requires verbal cues;Requires tactile cues General Comments: Left inattention noted but able to gaze right past midline on a few occasions with constant cues.     Extremity/Trunk Assessment  Exercises Other Exercises Other Exercises: Educated wife on proper positioning of LUE in sitting to reduce chance of injury and increase awareness of LUE. Educated wife  on pt's need to massage LUE, rub lotion on LUE, bath LUE, etc.  Other Exercises: Educated wife on need to sit on pt's L side to increase attention to L. Other Exercises: LUE PROM. Developing flexor tone   Shoulder Instructions       General Comments      Pertinent Vitals/ Pain       Pain Assessment: Faces Faces Pain Scale: Hurts a little bit Pain Location: head Pain Descriptors / Indicators: Grimacing Pain Intervention(s): Limited activity within patient's tolerance  Home Living                                          Prior Functioning/Environment              Frequency Min 2X/week     Progress Toward Goals  OT Goals(current goals can now be found in the care plan section)  Progress towards OT goals: Progressing toward goals  Acute Rehab OT Goals Patient Stated Goal: to go to bed OT Goal Formulation: With patient Time For Goal Achievement: 01/25/16 Potential to Achieve Goals: Good ADL Goals Pt Will Perform Grooming: with min guard assist;sitting Pt Will Perform Upper Body Bathing: with min assist;sitting Pt Will Perform Lower Body Bathing: with mod assist;sit to/from stand Pt Will Transfer to Toilet: with mod assist;stand pivot transfer;bedside commode Pt Will Perform Toileting - Clothing Manipulation and hygiene: with mod assist;sit to/from stand  Plan Discharge plan remains appropriate    Co-evaluation                 End of Session     Activity Tolerance Patient tolerated treatment well   Patient Left in chair;with call bell/phone within reach;with family/visitor present   Nurse Communication Mobility status        Time: 1420-1436 OT Time Calculation (min): 16 min  Charges: OT General Charges $OT Visit: 1 Procedure OT Treatments $Self Care/Home Management : 8-22 mins  Cailen Texeira,HILLARY 01/12/2016, 3:20 PM   [facility], OTR/L  [phone] 01/12/2016

## 2016-01-13 LAB — CBC
HCT: 43.3 % (ref 39.0–52.0)
HEMOGLOBIN: 14.6 g/dL (ref 13.0–17.0)
MCH: 34.1 pg — ABNORMAL HIGH (ref 26.0–34.0)
MCHC: 33.7 g/dL (ref 30.0–36.0)
MCV: 101.2 fL — ABNORMAL HIGH (ref 78.0–100.0)
Platelets: 150 10*3/uL (ref 150–400)
RBC: 4.28 MIL/uL (ref 4.22–5.81)
RDW: 12.1 % (ref 11.5–15.5)
WBC: 10.3 10*3/uL (ref 4.0–10.5)

## 2016-01-13 LAB — BASIC METABOLIC PANEL
Anion gap: 11 (ref 5–15)
BUN: 10 mg/dL (ref 6–20)
CHLORIDE: 102 mmol/L (ref 101–111)
CO2: 23 mmol/L (ref 22–32)
CREATININE: 0.6 mg/dL — AB (ref 0.61–1.24)
Calcium: 9.4 mg/dL (ref 8.9–10.3)
GFR calc non Af Amer: >60 mL/min (ref >60)
Glucose, Bld: 105 mg/dL — ABNORMAL HIGH (ref 65–99)
Potassium: 3.9 mmol/L (ref 3.5–5.1)
SODIUM: 136 mmol/L (ref 135–145)

## 2016-01-13 LAB — GLUCOSE, CAPILLARY
GLUCOSE-CAPILLARY: 104 mg/dL — AB (ref 65–99)
Glucose-Capillary: 92 mg/dL (ref 65–99)
Glucose-Capillary: 116 mg/dL — ABNORMAL HIGH (ref 65–99)
Glucose-Capillary: 112 mg/dL — ABNORMAL HIGH (ref 65–99)

## 2016-01-13 MED ORDER — SERTRALINE HCL 50 MG PO TABS
50.0000 mg | ORAL_TABLET | Freq: Every day | ORAL | Status: DC
  Administered 2016-01-14: 50 mg via ORAL
  Filled 2016-01-13: qty 1

## 2016-01-13 MED ORDER — METOPROLOL TARTRATE 50 MG PO TABS
75.0000 mg | ORAL_TABLET | Freq: Two times a day (BID) | ORAL | Status: DC
  Administered 2016-01-13 – 2016-01-14 (×2): 75 mg via ORAL
  Filled 2016-01-13 (×2): qty 1

## 2016-01-13 NOTE — Progress Notes (Signed)
Inpatient Rehabilitation  I continue to await a decision on IP Rehab authorization  from pt's [employer].  I do not have bed availability today even if I do receive a favorable decision.  I informed pt's wife [name] of the above and made her aware that there are other IP Rehab programs relatively close [facility], [facility]  She states [facility]  [facility] is very [facility] and whether she should go "sign him in".  I discussed the case with Derenda FennelBashira Nixon, LCSW and with Jeannie Crutchfield, RNCM.  Jeannie plans to review the pt's case and will proceed as indicated.  I also was able to update Dr. Roda ShuttersXu of the above.  We will continue to follow along and await insurance decision.  Fae PippinMelissa Bowie will assume this patient's tomorrow in my absence.  Please call if questions.  Weldon PickingSusan Wava Kildow PT Inpatient Rehab Admissions Coordinator Cell [phone] Office [phone]

## 2016-01-13 NOTE — Clinical Social Work Note (Signed)
Per MD, patient is not eating much and not medically stable for d/c for today, 6/21. Facility notified.   [facility]. Authorization obtained. CSW remains available as needed.   Derenda FennelBashira Tra Wilemon, MSW, LCSWA [phone] 01/13/2016 2:12 PM

## 2016-01-13 NOTE — Progress Notes (Signed)
Inpatient Rehabilitation  I received insurance approval from pt's [employer] for IP Rehab.  I informed [employer] case manager that we have no bed availability today and it is not expected that we will have bed availability tomorrow. I notified pt's wife [name] of the above.  I provided Jeannie Crutchfield, RNCM with the [employer] contact info in the event that she can secure an IP Rehab bed at another facility.  We will continue to follow for dispo.   Weldon PickingSusan Brionne Mertz PT Inpatient Rehab Admissions Coordinator Cell [phone] Office [phone]

## 2016-01-13 NOTE — Clinical Social Work Note (Signed)
Clinical Social Worker informed by SN[facility] that patient's wife, [name] did not show up for [facility]. Wife did not contact facility to reschedule or notify admissions director that she was not coming.   CSW placed call to pt's wife who reported that she would prefer patient goes to an acute rehab vs SNF. Wife is aware that there are not any beds currently available for CIR and beds will not be available over the next few days.   CSW remains available as needed.   Derenda FennelBashira Deniz Hannan, MSW, LCSWA [phone] 01/13/2016 3:14 PM

## 2016-01-13 NOTE — Progress Notes (Signed)
STROKE TEAM PROGRESS NOTE   SUBJECTIVE (INTERVAL HISTORY) Wife at bedside, very moody. Pt seems also in depressed mood as well as lack of good appetite. Not eating too much. On IVF. Still has dense left hemiplegia. Pending SNF vs. CIR. Wife prefers CIR.    OBJECTIVE Temp:  [98 F (36.7 C)-99 F (37.2 C)] 99 F (37.2 C) (06/21 2126) Pulse Rate:  [79-99] 99 (06/21 2126) Cardiac Rhythm:  [-] Normal sinus rhythm (06/21 2037) Resp:  [20] 20 (06/21 2126) BP: (141-163)/(57-77) 141/64 mmHg (06/21 2126) SpO2:  [96 %-100 %] 96 % (06/21 2126)  CBC:  Recent Labs Lab 01/08/16 1120  01/12/16 0635 01/13/16 0635  WBC 7.5  < > 8.2 10.3  NEUTROABS 4.5  --   --   --   HGB 14.8  < > 14.4 14.6  HCT 43.7  < > 42.0 43.3  MCV 101.6*  < > 99.5 101.2*  PLT 138*  < > 147* 150  < > = values in this interval not displayed.  Basic Metabolic Panel:   Recent Labs Lab 01/12/16 0635 01/13/16 0635  NA 135 136  K 3.7 3.9  CL 104 102  CO2 20* 23  GLUCOSE 85 105*  BUN 10 10  CREATININE 0.62 0.60*  CALCIUM 9.0 9.4    Lipid Panel:     Component Value Date/Time   CHOL 250* 01/09/2016 0807   TRIG 212* 01/09/2016 0807   HDL 51 01/09/2016 0807   CHOLHDL 4.9 01/09/2016 0807   VLDL 42* 01/09/2016 0807   LDLCALC 157* 01/09/2016 0807   HgbA1c:  Lab Results  Component Value Date   HGBA1C 5.5 01/09/2016   Urine Drug Screen: No results found for: LABOPIA, COCAINSCRNUR, LABBENZ, AMPHETMU, THCU, LABBARB    IMAGING I have personally reviewed the radiological images below and agree with the radiology interpretations.  Ct Head Wo Contrast 01/08/2016   Right basal ganglia hemorrhage with 31 cc volume. 3 mm midline shift.   CTA head and neck  01/09/2016 Unchanged RIGHT basal ganglia hemorrhage, favored to represent a hypertensive related bleed.  Volume today 32 mm, essentially unchanged. Mild 3 mm RIGHT-to-LEFT shift. No intracranial or extracranial stenosis, dissection, aneurysm, or vascular  malformation.  TTE  01/09/2016 - Left ventricle: The cavity size was normal. Wall thickness was increased in a pattern of moderate LVH. Systolic function was normal. The estimated ejection fraction was in the range of 60% to 65%.  Wall motion was normal; there were no regional wall motion abnormalities. Left ventricular diastolic function parameters were normal. - Pulmonary arteries: PA peak pressure: 52 mm Hg (S). Impressions:   No cardiac source of emboli was indentified.   PHYSICAL EXAM General - Well nourished, well developed, in no apparent distress.  Ophthalmologic - Fundi not visualized due to eye movement.  Cardiovascular - Regular rate and rhythm.  Mental Status -  Level of arousal and orientation to time, place, and person were intact. Language including expression, naming, repetition, comprehension was assessed and found intact, moderate dysarthria Fund of Knowledge was assessed and was intact.  Cranial Nerves II - XII - II - Visual field intact OU. III, IV, VI - Extraocular movements intact. V - Facial sensation decreased on the left. VII - left facial droop. VIII - Hearing & vestibular intact bilaterally. X - Palate elevates symmetrically. XI - Chin turning & shoulder shrug intact bilaterally. XII - Tongue protrusion intact.  Motor Strength - The patient's strength was 0/5 LUE and LLE, 5/5 RUE and  RLE and pronator drift was absent.  Bulk was normal and fasciculations were absent.   Motor Tone - Muscle tone was assessed at the neck and appendages and was normal.  Reflexes - The patient's reflexes were 1+ in all extremities and he had no pathological reflexes.  Sensory - Light touch, temperature/pinprick were assessed and were decreased on the left, 10% of right.    Coordination - The patient had normal movements in the right hand with no ataxia or dysmetria.  Tremor was absent.  Gait and Station - deferred due to weakness   ASSESSMENT/PLAN [name] is  a 60 y.o. male with history of hypertension, alcohol abuse, and liver damage presenting with severe headache, left-sided weakness, slurred speech, and elevated blood pressure. He did not receive IV t-PA due to right basal ganglia hemorrhage.  ICH:  right BG hemorrhage likely due to hypertension  Resultant  Left hemiplegia, hemisensory deficit, and dysarthria  CT head showed left BG ICH  CTA of head and neck - Unchanged RIGHT basal ganglia hemorrhage.  2D Echo  EF 60 - 65%   LDL 157  HgbA1c 5.5  VTE prophylaxis - SCDs and heparin subq DIET - DYS 1 Room service appropriate?: Yes; Fluid consistency:: Nectar Thick  No antithrombotic prior to admission, now on No antithrombotic.  Ongoing aggressive stroke risk factor management  Therapy recommendations: CIR  Disposition: Pending  Hypertension  Stable within goal   BP goal < 160 On home metoprolol Add amlodipine  Hyperlipidemia  Home meds: Pravachol 40 mg daily  LDL 157, goal < 70  Resumed home meds  Continue statin at discharge  Depression   Add zoloft   Family support   Other Stroke Risk Factors  ETOH use, advised to drink no more than 1 - 2 drinks a day  Obesity, Body mass index is 30.42 kg/(m^2)., recommend weight loss, diet and exercise as appropriate   Other Active Problems   Elevated glucose - CBG monitoring  Hypokalemia  - supplement  Hospital day # 5   Jon PlanJindong Alfie Alderfer, MD PhD Stroke Neurology 01/13/2016 10:33 PM  To contact Stroke Continuity provider, please refer to [email or web address]. After hours, contact General Neurology

## 2016-01-13 NOTE — Progress Notes (Addendum)
Called and LM for, Jon PeaPam Byers, RN  who is the  Admissions Coordinator at the [facility]patient [facility]hab [facility].This pt had received insurance authorization for CIR yet we have no beds available in this facility here today and the pt will not be ready for discharge until 01/14/2016. I have called [facility] @  [phone] and sent all clinical information to the admission coordinator at fax # [phone]. (H & P, PT/OT notes and Physiatrist eval).  I will recall in am to pursue IP rehab for this pt. As I suspect he will be ready for discharge in am.

## 2016-01-14 ENCOUNTER — Inpatient Hospital Stay (HOSPITAL_COMMUNITY)
Admission: RE | Admit: 2016-01-14 | Discharge: 2016-02-04 | DRG: 057 | Disposition: A | Discharge status: Home Health | Payer: PRIVATE HEALTH INSURANCE | Source: Intra-hospital | Attending: Physical Medicine & Rehabilitation | Admitting: Physical Medicine & Rehabilitation

## 2016-01-14 DIAGNOSIS — R630 Anorexia: Secondary | ICD-10-CM | POA: Insufficient documentation

## 2016-01-14 DIAGNOSIS — F329 Major depressive disorder, single episode, unspecified: Secondary | ICD-10-CM

## 2016-01-14 DIAGNOSIS — I619 Nontraumatic intracerebral hemorrhage, unspecified: Secondary | ICD-10-CM | POA: Diagnosis present

## 2016-01-14 DIAGNOSIS — G8194 Hemiplegia, unspecified affecting left nondominant side: Secondary | ICD-10-CM | POA: Diagnosis present

## 2016-01-14 DIAGNOSIS — Z91018 Allergy to other foods: Secondary | ICD-10-CM | POA: Diagnosis not present

## 2016-01-14 DIAGNOSIS — R51 Headache: Secondary | ICD-10-CM | POA: Diagnosis present

## 2016-01-14 DIAGNOSIS — M62838 Other muscle spasm: Secondary | ICD-10-CM | POA: Insufficient documentation

## 2016-01-14 DIAGNOSIS — I10 Essential (primary) hypertension: Secondary | ICD-10-CM | POA: Diagnosis present

## 2016-01-14 DIAGNOSIS — I69398 Other sequelae of cerebral infarction: Secondary | ICD-10-CM

## 2016-01-14 DIAGNOSIS — N390 Urinary tract infection, site not specified: Secondary | ICD-10-CM | POA: Diagnosis present

## 2016-01-14 DIAGNOSIS — I69154 Hemiplegia and hemiparesis following nontraumatic intracerebral hemorrhage affecting left non-dominant side: Principal | ICD-10-CM

## 2016-01-14 DIAGNOSIS — R739 Hyperglycemia, unspecified: Secondary | ICD-10-CM | POA: Diagnosis present

## 2016-01-14 DIAGNOSIS — I69359 Hemiplegia and hemiparesis following cerebral infarction affecting unspecified side: Secondary | ICD-10-CM

## 2016-01-14 DIAGNOSIS — R319 Hematuria, unspecified: Secondary | ICD-10-CM | POA: Diagnosis present

## 2016-01-14 DIAGNOSIS — Z91013 Allergy to seafood: Secondary | ICD-10-CM

## 2016-01-14 DIAGNOSIS — R269 Unspecified abnormalities of gait and mobility: Secondary | ICD-10-CM

## 2016-01-14 DIAGNOSIS — I69391 Dysphagia following cerebral infarction: Secondary | ICD-10-CM | POA: Diagnosis not present

## 2016-01-14 DIAGNOSIS — I610 Nontraumatic intracerebral hemorrhage in hemisphere, subcortical: Secondary | ICD-10-CM | POA: Diagnosis not present

## 2016-01-14 DIAGNOSIS — E669 Obesity, unspecified: Secondary | ICD-10-CM | POA: Diagnosis present

## 2016-01-14 DIAGNOSIS — R1310 Dysphagia, unspecified: Secondary | ICD-10-CM | POA: Diagnosis present

## 2016-01-14 DIAGNOSIS — F4323 Adjustment disorder with mixed anxiety and depressed mood: Secondary | ICD-10-CM | POA: Diagnosis present

## 2016-01-14 DIAGNOSIS — I69319 Unspecified symptoms and signs involving cognitive functions following cerebral infarction: Secondary | ICD-10-CM | POA: Diagnosis not present

## 2016-01-14 DIAGNOSIS — F1020 Alcohol dependence, uncomplicated: Secondary | ICD-10-CM | POA: Diagnosis present

## 2016-01-14 DIAGNOSIS — E785 Hyperlipidemia, unspecified: Secondary | ICD-10-CM | POA: Diagnosis present

## 2016-01-14 DIAGNOSIS — L03114 Cellulitis of left upper limb: Secondary | ICD-10-CM | POA: Diagnosis present

## 2016-01-14 DIAGNOSIS — F32A Depression, unspecified: Secondary | ICD-10-CM | POA: Diagnosis present

## 2016-01-14 DIAGNOSIS — G8110 Spastic hemiplegia affecting unspecified side: Secondary | ICD-10-CM | POA: Insufficient documentation

## 2016-01-14 LAB — CBC
HEMATOCRIT: 44.2 % (ref 39.0–52.0)
Hemoglobin: 15 g/dL (ref 13.0–17.0)
MCH: 33.7 pg (ref 26.0–34.0)
MCHC: 33.9 g/dL (ref 30.0–36.0)
MCV: 99.3 fL (ref 78.0–100.0)
PLATELETS: 154 10*3/uL (ref 150–400)
RBC: 4.45 MIL/uL (ref 4.22–5.81)
RDW: 12.1 % (ref 11.5–15.5)
WBC: 12.2 10*3/uL — AB (ref 4.0–10.5)

## 2016-01-14 LAB — URINALYSIS W MICROSCOPIC (NOT AT ARMC)
BILIRUBIN URINE: NEGATIVE
GLUCOSE, UA: NEGATIVE mg/dL
Ketones, ur: 15 mg/dL — AB
NITRITE: POSITIVE — AB
Protein, ur: 30 mg/dL — AB
SPECIFIC GRAVITY, URINE: 1.027 (ref 1.005–1.030)
pH: 7 (ref 5.0–8.0)

## 2016-01-14 LAB — CREATININE, SERUM: Creatinine, Ser: 0.65 mg/dL (ref 0.61–1.24)

## 2016-01-14 LAB — GLUCOSE, CAPILLARY: Glucose-Capillary: 93 mg/dL (ref 65–99)

## 2016-01-14 MED ORDER — FOLIC ACID 1 MG PO TABS
1.0000 mg | ORAL_TABLET | Freq: Every day | ORAL | Status: DC
  Administered 2016-01-15 – 2016-02-04 (×21): 1 mg via ORAL
  Filled 2016-01-14 (×21): qty 1

## 2016-01-14 MED ORDER — SERTRALINE HCL 50 MG PO TABS
50.0000 mg | ORAL_TABLET | Freq: Every day | ORAL | Status: DC
  Administered 2016-01-15 – 2016-02-04 (×21): 50 mg via ORAL
  Filled 2016-01-14 (×21): qty 1

## 2016-01-14 MED ORDER — PANTOPRAZOLE SODIUM 40 MG PO TBEC
40.0000 mg | DELAYED_RELEASE_TABLET | Freq: Every day | ORAL | Status: DC
  Administered 2016-01-15 – 2016-02-04 (×21): 40 mg via ORAL
  Filled 2016-01-14 (×21): qty 1

## 2016-01-14 MED ORDER — ADULT MULTIVITAMIN W/MINERALS CH
1.0000 | ORAL_TABLET | Freq: Every day | ORAL | Status: DC
  Administered 2016-01-15 – 2016-02-04 (×21): 1 via ORAL
  Filled 2016-01-14 (×21): qty 1

## 2016-01-14 MED ORDER — HEPARIN SODIUM (PORCINE) 5000 UNIT/ML IJ SOLN: 5000.0000 [IU] | Freq: Three times a day (TID) | INTRAMUSCULAR | Status: DC

## 2016-01-14 MED ORDER — HEPARIN SODIUM (PORCINE) 5000 UNIT/ML IJ SOLN
5000.0000 [IU] | Freq: Three times a day (TID) | INTRAMUSCULAR | Status: DC
  Administered 2016-01-14 – 2016-02-04 (×62): 5000 [IU] via SUBCUTANEOUS
  Filled 2016-01-14 (×62): qty 1

## 2016-01-14 MED ORDER — SORBITOL 70 % SOLN: 30.0000 mL | Freq: Every day | Status: DC | PRN

## 2016-01-14 MED ORDER — TOPIRAMATE 25 MG PO TABS
25.0000 mg | ORAL_TABLET | Freq: Two times a day (BID) | ORAL | Status: DC
  Administered 2016-01-14 – 2016-02-01 (×37): 25 mg via ORAL
  Filled 2016-01-14 (×38): qty 1

## 2016-01-14 MED ORDER — ONDANSETRON HCL 4 MG PO TABS: 4.0000 mg | ORAL_TABLET | Freq: Four times a day (QID) | ORAL | Status: DC | PRN

## 2016-01-14 MED ORDER — METOPROLOL TARTRATE 50 MG PO TABS
75.0000 mg | ORAL_TABLET | Freq: Two times a day (BID) | ORAL | Status: DC
  Administered 2016-01-14 – 2016-02-04 (×42): 75 mg via ORAL
  Filled 2016-01-14 (×42): qty 1

## 2016-01-14 MED ORDER — ONDANSETRON HCL 4 MG/2ML IJ SOLN: 4.0000 mg | Freq: Four times a day (QID) | INTRAMUSCULAR | Status: DC | PRN

## 2016-01-14 MED ORDER — ACETAMINOPHEN-CODEINE #3 300-30 MG PO TABS
1.0000 | ORAL_TABLET | ORAL | Status: DC | PRN
  Administered 2016-01-14 – 2016-01-18 (×5): 2 via ORAL
  Administered 2016-01-18: 1 via ORAL
  Administered 2016-01-20 – 2016-01-24 (×5): 2 via ORAL
  Administered 2016-01-26: 1 via ORAL
  Administered 2016-01-26 – 2016-01-28 (×3): 2 via ORAL
  Administered 2016-01-29 – 2016-02-01 (×3): 1 via ORAL
  Administered 2016-02-02 – 2016-02-03 (×4): 2 via ORAL
  Filled 2016-01-14: qty 1
  Filled 2016-01-14 (×4): qty 2
  Filled 2016-01-14: qty 1
  Filled 2016-01-14 (×9): qty 2
  Filled 2016-01-14: qty 1
  Filled 2016-01-14 (×3): qty 2
  Filled 2016-01-14: qty 1
  Filled 2016-01-14 (×4): qty 2

## 2016-01-14 MED ORDER — ACETAMINOPHEN 325 MG PO TABS
650.0000 mg | ORAL_TABLET | ORAL | Status: DC | PRN
  Administered 2016-01-16 – 2016-01-22 (×3): 650 mg via ORAL
  Filled 2016-01-14 (×3): qty 2

## 2016-01-14 MED ORDER — CEPHALEXIN 250 MG PO CAPS
500.0000 mg | ORAL_CAPSULE | Freq: Three times a day (TID) | ORAL | Status: DC
  Administered 2016-01-14 – 2016-01-22 (×23): 500 mg via ORAL
  Filled 2016-01-14 (×23): qty 2

## 2016-01-14 MED ORDER — AMLODIPINE BESYLATE 10 MG PO TABS
10.0000 mg | ORAL_TABLET | Freq: Every day | ORAL | Status: DC
  Administered 2016-01-15 – 2016-02-04 (×21): 10 mg via ORAL
  Filled 2016-01-14 (×21): qty 1

## 2016-01-14 MED ORDER — VITAMIN B-1 100 MG PO TABS
100.0000 mg | ORAL_TABLET | Freq: Every day | ORAL | Status: DC
  Administered 2016-01-15 – 2016-02-04 (×21): 100 mg via ORAL
  Filled 2016-01-14 (×21): qty 1

## 2016-01-14 MED ORDER — PRAVASTATIN SODIUM 40 MG PO TABS
40.0000 mg | ORAL_TABLET | Freq: Every day | ORAL | Status: DC
  Administered 2016-01-14 – 2016-02-03 (×21): 40 mg via ORAL
  Filled 2016-01-14 (×21): qty 1

## 2016-01-14 MED ORDER — SENNOSIDES-DOCUSATE SODIUM 8.6-50 MG PO TABS
1.0000 | ORAL_TABLET | Freq: Two times a day (BID) | ORAL | Status: DC
  Administered 2016-01-14 – 2016-02-04 (×36): 1 via ORAL
  Filled 2016-01-14 (×42): qty 1

## 2016-01-14 MED ORDER — ENSURE ENLIVE PO LIQD
237.0000 mL | Freq: Two times a day (BID) | ORAL | Status: DC
  Administered 2016-01-15 – 2016-01-29 (×15): 237 mL via ORAL

## 2016-01-14 MED ORDER — CEPHALEXIN 250 MG PO CAPS: 250.0000 mg | ORAL_CAPSULE | Freq: Three times a day (TID) | ORAL | Status: DC

## 2016-01-14 NOTE — Discharge Summary (Signed)
Stroke Discharge Summary  Patient ID: Jon [name]   MRN: [id number]      DOB: 11-Jun-1956  Date of Admission: 01/08/2016 Date of Discharge: 01/14/2016  Attending Physician:  Marvel Plan, MD, Stroke MD Patient's PCP:  PROVIDER NOT IN SYSTEM  Discharge Diagnoses:  Principal Problem:   Hemorrhagic stroke (HCC) R basal ganglia d/t HTN Active Problems:   Left hemiparesis (HCC)   Essential hypertension   Dysphagia, post-stroke   Gait disturbance, post-stroke   Hyponatremia   Thrombocytopenia (HCC)   Hyperlipidemia   Depression   Obesity   Hyperglycemia BMI  Body mass index is 30.42 kg/(m^2).   Past Medical History  Diagnosis Date  . Hypertension   . Liver damage    History reviewed. No pertinent past surgical history.  Medications to be continued on Rehab . amLODipine  10 mg Oral Daily  . feeding supplement (ENSURE ENLIVE)  237 mL Oral BID BM  . folic acid  1 mg Oral Daily  . heparin subcutaneous  5,000 Units Subcutaneous Q8H  . metoprolol tartrate  75 mg Oral BID  . multivitamin with minerals  1 tablet Oral Daily  . pantoprazole  40 mg Oral Daily  . pravastatin  40 mg Oral q1800  . senna-docusate  1 tablet Oral BID  . sertraline  50 mg Oral Daily  . thiamine  100 mg Oral Daily    LABORATORY STUDIES CBC    Component Value Date/Time   WBC 10.3 01/13/2016 0635   RBC 4.28 01/13/2016 0635   HGB 14.6 01/13/2016 0635   HCT 43.3 01/13/2016 0635   PLT 150 01/13/2016 0635   MCV 101.2* 01/13/2016 0635   MCH 34.1* 01/13/2016 0635   MCHC 33.7 01/13/2016 0635   RDW 12.1 01/13/2016 0635   LYMPHSABS 2.0 01/08/2016 1120   MONOABS 0.6 01/08/2016 1120   EOSABS 0.3 01/08/2016 1120   BASOSABS 0.1 01/08/2016 1120   CMP    Component Value Date/Time   NA 136 01/13/2016 0635   K 3.9 01/13/2016 0635   CL 102 01/13/2016 0635   CO2 23 01/13/2016 0635   GLUCOSE 105* 01/13/2016 0635   BUN 10 01/13/2016 0635   CREATININE 0.60* 01/13/2016 0635   CALCIUM 9.4 01/13/2016 0635    PROT 6.5 01/08/2016 1120   ALBUMIN 3.5 01/08/2016 1120   AST 121* 01/08/2016 1120   ALT 80* 01/08/2016 1120   ALKPHOS 67 01/08/2016 1120   BILITOT 0.6 01/08/2016 1120   GFRNONAA >60 01/13/2016 0635   GFRAA >60 01/13/2016 0635   COAGS Lab Results  Component Value Date   INR 1.07 01/08/2016   Lipid Panel    Component Value Date/Time   CHOL 250* 01/09/2016 0807   TRIG 212* 01/09/2016 0807   HDL 51 01/09/2016 0807   CHOLHDL 4.9 01/09/2016 0807   VLDL 42* 01/09/2016 0807   LDLCALC 157* 01/09/2016 0807   HgbA1C  Lab Results  Component Value Date   HGBA1C 5.5 01/09/2016   Cardiac Panel (last 3 results)   Recent Labs  01/11/16 1619  TROPONINI <0.03    SIGNIFICANT DIAGNOSTIC STUDIES  Ct Head Wo Contrast 01/08/2016  Right basal ganglia hemorrhage with 31 cc volume. 3 mm midline shift.   CTA head and neck  01/09/2016 Unchanged RIGHT basal ganglia hemorrhage, favored to represent a hypertensive related bleed.  Volume today 32 mm, essentially unchanged. Mild 3 mm RIGHT-to-LEFT shift. No intracranial or extracranial stenosis, dissection, aneurysm, or vascular malformation.  TTE  01/09/2016 -  Left ventricle: The cavity size was normal. Wall thickness was increased in a pattern of moderate LVH. Systolic function was normal. The estimated ejection fraction was in the range of 60% to 65%. Wall motion was normal; there were no regional wall motion abnormalities. Left ventricular diastolic function parameters were normal. - Pulmonary arteries: PA peak pressure: 52 mm Hg (S). Impressions: No cardiac source of emboli was indentified.  CXR 01/09/2016 No active disease.     HISTORY OF PRESENT ILLNESS Jon [name] is a 60 y.o. male who was last normal this AM 01/09/2016 at 0940 after waking up and taking a shower. He went back to bed and during marital relations he suddenly developed a headache and left sided weakness. He also had slurring of speech. His headache is severe and  located behind his right eye. Initial BP was elevated but was brought under control with nicardipine gtt in the ED. CT showed a R basal ganglia hemorrhage. ICH Score: 1. He was admitted to the neuro ICU for further evaluation and treatment.    HOSPITAL COURSE [name] is a 60 y.o. male with history of hypertension, alcohol abuse, and liver damage presenting with severe headache, left-sided weakness, slurred speech, and elevated blood pressure. He did not receive IV t-PA due to right basal ganglia hemorrhage.  ICH: right BG hemorrhage likely due to hypertension  Resultant Left hemiplegia, hemisensory deficit, and dysarthria  CT head showed left BG ICH  CTA of head and neck - Unchanged RIGHT basal ganglia hemorrhage.  2D Echo EF 60 - 65%  LDL 157  HgbA1c 5.5  No antithrombotic prior to admission  Ongoing aggressive stroke risk factor management  Therapy recommendations: CIR  Disposition: CIR  Hypertension  Stable within goal  BP goal < 160  On home metoprolol  Added amlodipine  Hyperlipidemia  Home meds: Pravachol 40 mg daily  LDL 157, goal < 70  Continue statin at discharge  Depression   Add zoloft   Family support  Other Stroke Risk Factors  ETOH use, advised to drink no more than 2 drinks a day  Obesity, Body mass index is 30.42 kg/(m^2)., recommend weight loss, diet and exercise as appropriate  Other Active Problems   Elevated glucose - CBG monitoring  Hypokalemia, resolved - supplement   DISCHARGE EXAM Blood pressure 135/71, pulse 90, temperature 98.7 F (37.1 C), temperature source Oral, resp. rate 20, height 5\' 8"  (1.727 m), weight 90.719 kg (200 lb), SpO2 98 %. General - Well nourished, well developed, in no apparent distress.  Ophthalmologic - Fundi not visualized due to eye movement.  Cardiovascular - Regular rate and rhythm.  Mental Status -  Level of arousal and orientation to time, place, and person were  intact. Language including expression, naming, repetition, comprehension was assessed and found intact, moderate dysarthria Fund of Knowledge was assessed and was intact.  Cranial Nerves II - XII - II - Visual field intact OU. III, IV, VI - Extraocular movements intact. V - Facial sensation decreased on the left. VII - left facial droop. VIII - Hearing & vestibular intact bilaterally. X - Palate elevates symmetrically. XI - Chin turning & shoulder shrug intact bilaterally. XII - Tongue protrusion intact.  Motor Strength - The patient's strength was 0/5 LUE and LLE, 5/5 RUE and RLE and pronator drift was absent. Bulk was normal and fasciculations were absent.  Motor Tone - Muscle tone was assessed at the neck and appendages and was normal.  Reflexes - The patient's  reflexes were 1+ in all extremities and he had no pathological reflexes.  Sensory - Light touch, temperature/pinprick were assessed and were decreased on the left, 10% of right.   Coordination - The patient had normal movements in the right hand with no ataxia or dysmetria. Tremor was absent.  Gait and Station - deferred due to weakness   Discharge Diet  DIET DYS 3 Room service appropriate?: Yes with Assist; Fluid consistency:: Thin liquids  DISCHARGE PLAN  Disposition:  [facility], OT and ST  Due to hemorrhage and risk of bleeding, do not take aspirin, aspirin-containing medications, or ibuprofen products   Recommend ongoing risk factor control by Primary Care Physician at time of discharge from inpatient rehabilitation.  Follow-up Primary Care MD in 2 weeks following discharge from rehab.  Follow-up with Dr. Marvel PlanJindong Luciano Cinquemani, Stroke Clinic in 2 months, office to schedule an appointment.   35 minutes were spent preparing discharge.  Rhoderick MoodyBIBY,SHARON  [facility] See Amion for Pager information 01/14/2016 2:33 PM   I, the attending vascular neurologist, have  personally obtained a history, examined the patient, evaluated laboratory data, individually viewed imaging studies and agree with radiology interpretations. I also discussed with wife regarding his care plan. Together with the NP/PA, we formulated the assessment and plan of care which reflects our mutual decision.  I have made any additions or clarifications directly to the above note and agree with the findings and plan as currently documented.   Pt neuro stable. Able to work with PT/OT/speech although stating "it is too hard". Appetite improved some but still on IVF. Wife at bedside, encourage more family support. Pt will d/c to CIR today.   Marvel PlanJindong Alaena Strader, MD PhD Stroke Neurology 01/14/2016 10:49 PM

## 2016-01-14 NOTE — Progress Notes (Signed)
Spoke with Admissions Coordinator @ [facility] Elita Quick(Pam Jearld FentonByers [phone])  this am who will begin the authorization process ASAP. She has asked that I called the rep at [facility] [facility](Caroline Coleman [phone])  to cancel prior authorization so a new process can begin, which I have done. Will update family and MD. CM will continue to follow.

## 2016-01-14 NOTE — Progress Notes (Signed)
Speech Language Pathology Treatment:    Patient Details Name: Jon [name] MRN: [id number] DOB: 01-27-1956 Today's Date: 01/14/2016 Time: [id number] SLP Time Calculation (min) (ACUTE ONLY): 40 min  Assessment / Plan / Recommendation Clinical Impression  SLP visiting continuing to focus on attention and dysphagia.  Pt today stated repeatedly "I want to lie down" and was reluctant to participate.  However upon SLP advising determining for dietary advancement, pt willing to participate.  SLP observed pt eating peaches, graham cracker *moistened, and thin soda via cup/straw.  Delayed oral transiting across all consistencies suspected with mild left sulci residuals that pt required cues to clear using lingual sweep.  No indication of aspiration/airway compromise.  Pt states he does not have an appetite and this further impacts his intake - not just dietary consistency.    Increased efficiency in attending to left noted during earlier session and using his hand as a guide with Mod I!   Advised pt to continue to scan left as much as able for compensations.     SLP questions if pt has some "sadness" in dealing with his CVA that may be impacting his participation.  When SLP asked re: sadness, pt stated "What are you worried I am going to join [employer]?"  Question if he is using humor to cover.     Using teach back and pt return demonstration, education ongoing.  SLP recommends to advance diet to dys3/thin and decrease distractions during sessions to maximize pt's attention.  Written precautions provided to pt and posted in room.     HPI HPI: 60 yo male [facility] after severe headache over right eye, left sided weakness  after intercourse with his wife.  Wife found him in down in bathroom diaphoretic.  Pt found to have a right basal hemorrhagic cva = + hematoma.  Pt PMH + for urinary retention, gall bladder issues, IBS and reflux.  Pt failed an RNSSS and speech/swallow eval ordered. CXR 6/17 negative.  Pt  reports he is LEFT HANDED.  He denies dysphagia prior to admission.       SLP Plan  Continue with current plan of care     Recommendations  Diet recommendations: Dysphagia 3 (mechanical soft);Thin liquid Liquids provided via: Cup;Straw Medication Administration: Whole meds with puree Supervision: Patient able to self feed;Full supervision/cueing for compensatory strategies Compensations: Minimize environmental distractions;Small sips/bites;Slow rate;Lingual sweep for clearance of pocketing Postural Changes and/or Swallow Maneuvers: Seated upright 90 degrees;Upright 30-60 min after meal             Oral Care Recommendations: Oral care BID;Oral care before and after PO Follow up Recommendations: Inpatient Rehab Plan: Continue with current plan of care     GO                Donavan Burnetamara Sufyaan Palma, MS [facility] SLP [phone]

## 2016-01-14 NOTE — Care Management Note (Addendum)
Case Management Note  Patient Details  Name: Jon [name] MRN: [id number] Date of Birth: 07/24/1956  Subjective/Objective:  CM [facility] contact [facility] to facilitate transfer to that unit if at all possible. Wife is aware, per Weldon PickingSusan Blankenship, that pt will either go to SNF or CIR, whichever is available when he is medically stable for discharge. [facility] bed [facility] for pt today. Will make [facility] aware, as well as, Xu, MD.                 Action/Plan:CM will continue to follow.    Expected Discharge Date:  01/11/16               Expected Discharge Plan:  Home w Home Health Services  In-House Referral:  NA  Discharge planning Services  CM Consult  Post Acute Care Choice:    Choice offered to:     DME Arranged:    DME Agency:     HH Arranged:    HH Agency:     Status of Service:  In process, will continue to follow  If discussed at Long Length of Stay Meetings, dates discussed: 01/14/2016   Additional Comments:  Yvone Neurutchfield, Neysha Criado M, RN 01/14/2016, 9:09 AM

## 2016-01-14 NOTE — Progress Notes (Signed)
Nutrition Follow-up  DOCUMENTATION CODES:   Obesity unspecified  INTERVENTION:  -Continue Ensure Enlive po BID, each supplement provides 350 kcal and 20 grams of protein -RD to continue to monitor  NUTRITION DIAGNOSIS:   Inadequate oral intake related to poor appetite as evidenced by per patient/family report.  GOAL:   Patient will meet greater than or equal to 90% of their needs  MONITOR:   PO intake, Labs, I & O's, Skin, Supplement acceptance  REASON FOR ASSESSMENT:   Consult Poor PO  ASSESSMENT:   Jon [name] is a 60 y.o. male who was last normal this AM at 0940 after waking up and taking a shower. He went back to bed and during marital relations he suddenly developed a headache and left sided weakness. He also had slurring of speech. His headache is severe and located behind his right eye. Initial BP was elevated but was brought under control with nicardipine gtt in the ED.   Spoke with Jon [name] at bedside, he was a little sleepy during my visit but answered most questions. He endorses poor appetite for 1 week PTA that continues now. He is on NDD3 diet, and pt does not like diet consistency, but states that appetite is main reason behind poor PO intake.  He had a tray at bedside that he had consumed maybe 10-15% of from my assessment. He is also receiving Ensure BID which he states is "alright." Denies weight loss, states usual weight of 200# Overall patient appears distant, sad, and uninterested. He didn't have much to say to me. Documented PO is 25-50%  Nutrition-Focused physical exam completed. Findings are no fat depletion, no muscle depletion, and no edema.   Labs and medications reviewed: Senokot-S, Thiamine, Folic Acid, MV with Minerals  Diet Order:  DIET DYS 3 Room service appropriate?: Yes with Assist; Fluid consistency:: Thin  Skin:  Reviewed, no issues  Last BM:  6/15  Height:   Ht Readings from Last 1 Encounters:  01/08/16 5\' 8"  (1.727 m)     Weight:   Wt Readings from Last 1 Encounters:  01/08/16 200 lb (90.719 kg)    Ideal Body Weight:  70 kg  BMI:  Body mass index is 30.42 kg/(m^2).  Estimated Nutritional Needs:   Kcal:  1700-2000 calories  Protein:  70-85 grams  Fluid:  >/= 1.7L  EDUCATION NEEDS:   No education needs identified at this time  Jon AnoWilliam M. Jihan Mellette, MS, RD LDN Inpatient Clinical Dietitian Pager [phone]

## 2016-01-14 NOTE — Progress Notes (Signed)
Fae PippinMelissa Ceara [facility] Admission Coordinator Signed Physical Medicine and Rehabilitation PMR Pre-admission 01/14/2016 11:30 AM  Related encounter: ED to Hosp-Admission (Discharged) from 01/08/2016 [facility]    Expand All Collapse All   PMR Admission Coordinator Pre-Admission Assessment  Patient: Jon CoolerGeorge D [name] is an 60 y.o., male MRN: [id number] DOB: 04-04-1956 Height: 5\' 8"  (172.7 cm) Weight: 90.719 kg (200 lb)  Insurance Information HMO: PPO: PCP: IPA: 80/20: OTHER: open access plus PRIMARY: Jon StackCigna [place] Policy#: [id number] Subscriber: Self CM Name: Jon LodgeCaroline [name] Phone#: [phone] Fax#: [phone] Pre-Cert#: [id number] Employer: Full time Benefits: Phone #: [phone] Name: Jon DrillingJanel Eff. [name]: 9[birth year]1/[birth year]14 Deduct: $1000 Out of Pocket Max: $3000 Life Max: None CIR: $250 co-pay then 80/20% SNF: 80%/20% limit 60 days a year Outpatient: PT/OT/SLP Co-Pay: $50, 20 visits  Home Health: 80% Co-Pay: 20%, 60 visit limits  DME: 80% Co-Pay: 20% Providers: in-network   Medicaid Application [name]: Case Manager:  Disability Application [name]: Case Worker:   Emergency Conservator, museum/galleryContact Information Contact Information    Name Relation Home Work Mobile   [name],Jon Spouse [phone]  [phone]     Current Medical History  Patient Admitting Diagnosis: Right basal ganglia hemorrhage  History of Present Illness:Jon [name] is a 60 y.o. right handed male with history of hypertension as well as alcohol use. Patient on no prescription medications. He is married and worked full-time prior to admission. Wife can assist on discharge. Presented 01/08/2016 with acute onset of headache and left-sided  weakness. Initial blood pressure was reportedly elevated. CT of the head showed right basal ganglia hemorrhage with 31 mL volume. 3 mm midline shift. CT angiogram head and neck with no intracranial or extracranial stenosis dissection or aneurysm. Echocardiogram with ejection fraction of 65% no wall motion abnormalities. Patient did not receive TPA secondary to ICH. Subcutaneous heparin for DVT prophylaxis added 01/10/2016. Physical therapy evaluation completed 01/10/2016 with recommendations of physical medicine rehabilitation consult.The patient's wife notes the patient is not motivated by nature and if he could produce "lying in bed all day.Patient was admitted for a comprehensive rehabilitation program   NIH Total: 15    Past Medical History  Past Medical History  Diagnosis [name]  . Hypertension   . Liver damage     Family History  family history is not on file.  Prior Rehab/Hospitalizations:  Has the patient had major surgery during 100 days prior to admission? No  Current Medications   Current facility-administered medications:  . 0.9 % sodium chloride infusion, , Intravenous, Continuous, Jon PlanJindong Xu, MD, Last Rate: 50 mL/hr at 01/13/16 0149 . acetaminophen (TYLENOL) tablet 650 mg, 650 mg, Oral, Q4H PRN, 650 mg at 01/14/16 0156 **OR** [DISCONTINUED] acetaminophen (TYLENOL) suppository 650 mg, 650 mg, Rectal, Q4H PRN, Jon PinaEric Lindzen, MD, 650 mg at 01/09/16 0232 . acetaminophen-codeine (TYLENOL #3) 300-30 MG per tablet 1-2 tablet, 1-2 tablet, Oral, Q4H PRN, Jon PinaEric Lindzen, MD, 2 tablet at 01/14/16 0807 . amLODipine (NORVASC) tablet 10 mg, 10 mg, Oral, Daily, Jon PlanJindong Xu, MD, 10 mg at 01/13/16 1045 . feeding supplement (ENSURE ENLIVE) (ENSURE ENLIVE) liquid 237 mL, 237 mL, Oral, BID BM, Jon PlanJindong Xu, MD, 237 mL at 01/13/16 1503 . folic acid (FOLVITE) tablet 1 mg, 1 mg, Oral, Daily, Jon PlanJindong Xu, MD, 1 mg at 01/13/16 1045 . heparin injection 5,000 Units, 5,000 Units,  Subcutaneous, Q8H, Jon PlanJindong Xu, MD, 5,000 Units at 01/14/16 0545 . labetalol (NORMODYNE,TRANDATE) injection 10-40 mg, 10-40 mg, Intravenous, Q2H PRN, Jon PlanJindong Xu, MD, 20 mg at 01/12/16  2229 . metoprolol tartrate (LOPRESSOR) tablet 75 mg, 75 mg, Oral, BID, Jon Plan, MD, 75 mg at 01/13/16 2242 . multivitamin with minerals tablet 1 tablet, 1 tablet, Oral, Daily, Jon Plan, MD, 1 tablet at 01/13/16 1045 . ondansetron (ZOFRAN) injection 4 mg, 4 mg, Intravenous, Q8H PRN, Jon L Rinehuls, PA-C . pantoprazole (PROTONIX) EC tablet 40 mg, 40 mg, Oral, Daily, Jon Plan, MD, 40 mg at 01/13/16 1045 . pravastatin (PRAVACHOL) tablet 40 mg, 40 mg, Oral, q1800, Jon Plan, MD, 40 mg at 01/13/16 1717 . RESOURCE THICKENUP CLEAR, , Oral, PRN, Jon Plan, MD . senna-docusate (Senokot-S) tablet 1 tablet, 1 tablet, Oral, BID, Jon Pina, MD, 1 tablet at 01/13/16 2242 . sertraline (ZOLOFT) tablet 50 mg, 50 mg, Oral, Daily, Jon Plan, MD, 50 mg at 01/14/16 1128 . 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  Home Assistive Devices / Equipment Home Assistive Devices/Equipment: None Home Equipment: None  Prior Device Use: Indicate devices/aids used by the patient prior to current illness, exacerbation or injury? None of the above  Prior Functional Level Prior Function Level of Independence:  Independent  Self Care: Did the patient need help bathing, dressing, using the toilet or eating? Independent  Indoor Mobility: Did the patient need assistance with walking from room to room (with or without device)? Independent  Stairs: Did the patient need assistance with internal or external stairs (with or without device)? Independent  Functional Cognition: Did the patient need help planning regular tasks such as shopping or remembering to take medications? Independent  Current Functional Level Cognition  Arousal/Alertness: Lethargic Overall Cognitive Status: Impaired/Different from baseline Current Attention Level: Sustained Orientation Level: Oriented X4 Following Commands: Follows one step commands with increased time (multimodal cues) Safety/Judgement: Decreased awareness of safety, Decreased awareness of deficits General Comments: Continues to have left inattention, able to gaze right with cues but does not sustain. Attention: Sustained Sustained Attention: Impaired Sustained Attention Impairment: Verbal basic, Functional basic Awareness: Impaired Awareness Impairment: Emergent impairment, Anticipatory impairment Problem Solving: Impaired Problem Solving Impairment: Functional basic Executive Function: Self Monitoring, Organizing Organizing: Impaired Organizing Impairment: Verbal basic Self Monitoring: Impaired Self Monitoring Impairment: Functional basic Behaviors: Impulsive, Perseveration Safety/Judgment: Impaired Comments: pt perseverative on wanting to go to sleep   Extremity Assessment (includes Sensation/Coordination)  Upper Extremity Assessment: LUE deficits/detail LUE Deficits / Details: flaccid LUE. Reports no sensation. LUE Sensation: decreased light touch, decreased proprioception LUE Coordination: decreased fine motor, decreased gross motor  Lower Extremity Assessment: Defer to PT evaluation LLE Deficits / Details: extensor tone noted LLE, no  active movement, does withdrawl to pain LLE Sensation: decreased light touch    ADLs  Overall ADL's : Needs assistance/impaired Eating/Feeding: Moderate assistance, Sitting Grooming: Wash/dry hands, Wash/dry face, Oral care, Minimal assistance, Sitting Grooming Details (indicate cue type and reason): to wipe mouth with increased secretions Upper Body Bathing: Moderate assistance, Sitting, Cueing for sequencing Lower Body Bathing: Maximal assistance, +2 for physical assistance, Sit  to/from stand, Cueing for sequencing Upper Body Dressing : Moderate assistance, Sitting Upper Body Dressing Details (indicate cue type and reason): Pt able to raise R arm and place into gown with max verbal cues. Lower Body Dressing: Maximal assistance, +2 for physical assistance, Sit to/from stand General ADL Comments: Facilitating trunk control during funcitonal reaching tasks. L bias. Poor awraeness of midline    Mobility  Overal bed mobility: Needs Assistance Bed Mobility: Supine to Sit Supine to sit: Max assist, HOB elevated Sit to supine: Max assist, +2 for physical assistance General bed mobility comments: Therapist stabilizing Rt heel and pt able to push through RLE and scoot bottom towards left to get to EOB. Assist with LLE and trunk to come to sitting.    Transfers  Overall transfer level: Needs assistance Equipment used: 2 person hand held assist Transfers: Squat Pivot Transfers Sit to Stand: Max assist, +2 physical assistance Squat pivot transfers: Max assist, +2 physical assistance General transfer comment: + 2 Max A squat pivot transfer to chair with manual/verbal cues for hand placement. Assist managing LLE during transfer.     Ambulation / Gait / Stairs / Engineer, drillingWheelchair Mobility       Posture / Balance Dynamic Sitting Balance Sitting balance - Comments: Variable assist needed- Min A-Max A sitting EOB with left lateral lean, no pushing noted today. manual cues for facilitation of  thoracic and lumbar extension and upright posture. Able to self correct with multimodal cues but not able to maintain. Sat EOB performing ADLs and attending to left side ~15 mins Balance Overall balance assessment: Needs assistance Sitting-balance support: Feet supported, Single extremity supported Sitting balance-Leahy Scale: Zero Sitting balance - Comments: Variable assist needed- Min A-Max A sitting EOB with left lateral lean, no pushing noted today. manual cues for facilitation of thoracic and lumbar extension and upright posture. Able to self correct with multimodal cues but not able to maintain. Sat EOB performing ADLs and attending to left side ~15 mins Postural control: Left lateral lean Standing balance support: During functional activity Standing balance-Leahy Scale: Zero Standing balance comment: Requires assist of 2 for standing balance- therapist stabilizing LLE and assisting with trunk as pt with left lateral lean. Able to stand for ~1-2 mins, ~1 min    Special needs/care consideration BiPAP/CPAP: No CPM: No Continuous Drip IV: No Dialysis: No  Life Vest: No Oxygen: No Special Bed: No Trach Size: No Wound Vac (area): No  Skin: WDL  Bowel mgmt: 01/07/16 Bladder mgmt: Condom catheter  Diabetic mgmt: HgbA1C 5.5      Previous Home Environment Living Arrangements: Spouse/significant other Lives With: Spouse Available Help at Discharge: Family Type of Home: House Home Layout: One level Home Access: Stairs to enter Secretary/administratorntrance Stairs-Number of Steps: 2 Bathroom Shower/Tub: Engineer, manufacturing systemsTub/shower unit Bathroom Toilet: Standard Home Care Services: No  Discharge Living Setting Plans for Discharge Living Setting: Patient's home, Lives with (comment) (Spouse) Type of Home at Discharge: House Discharge Home Layout: One level Discharge Home Access: Stairs to enter Entrance Stairs-Rails: Can reach both Entrance Stairs-Number of Steps:  3 Discharge Bathroom Shower/Tub: Tub/shower unit, Curtain Discharge Bathroom Toilet: Standard Discharge Bathroom Accessibility: Yes How Accessible: Accessible via walker Does the patient have any problems obtaining your medications?: No  Social/Family/Support Systems Patient Roles: Spouse, Parent Anticipated Caregiver: Spouse, [name] [phone] Anticipated Industrial/product designerCaregiver's Contact Information: home:[phone] Ability/Limitations of Caregiver: Patient's wife reports she can do light-mod assist with training  Caregiver Availability: 24/7 Discharge [name] Discussed with Primary Caregiver: Yes Is Caregiver In Agreement with  [name]?: Yes Does Caregiver/Family have Issues with Lodging/Transportation while Pt is in Rehab?: No  Goals/Additional Needs Patient/Family Goal for Rehab: PT/OT Min-Mod assist; SLP Supervision-Min assist  Expected length of stay: 20-24 days Cultural Considerations: None Dietary Needs: Allergy to tree nuts  Equipment Needs: TBD Special Service Needs: None Additional Information: Will a ramp be needed for discharge home? Pt/Family Agrees to Admission and willing to participate: Yes Program Orientation Provided & Reviewed with Pt/Caregiver Including Roles & Responsibilities: Yes Additional Information Needs: Will a ramp be needed for home discharge? Information Needs to be Provided By: Therapy input and recommendations if needed  Decrease burden of Care through IP rehab admission: No  Possible need for SNF placement upon discharge: Not anticipated   Patient Condition: This patient's medical and functional and support status has changed since the consult dated 01/11/16 in which the Rehabilitation Physician determined and documented that the patient was potentially appropriate for intensive rehabilitative care in an inpatient rehabilitation facility. Issues have been addressed and update has been discussed with Dr. Wynn Banker  and patient now appropriate for inpatient  rehabilitation given adequate support upon discharge. Will admit to inpatient rehab today.   Preadmission Screen Completed By: Fae Pippin, 01/14/2016 11:31 AM ______________________________________________________________________  Discussed status with Dr. Wynn Banker on 01/14/16 at 1150 and received telephone approval for admission today.  Admission Coordinator: Fae Pippin, time 1150/[name] 01/14/16          Cosigned by: [name], MD at 01/14/2016 12:19 PM  Revision History     [name]/Time User Provider Type Action   01/14/2016 12:19 PM [name], MD Physician Cosign   01/14/2016 11:50 AM Fae Pippin Rehab Admission Coordinator Sign

## 2016-01-14 NOTE — Clinical Social Work Note (Signed)
Patient being admitted to CIR/ IP REHAB today, 6/22. Facility and family aware.   Clinical Social Worker will sign off for now as social work intervention is no longer needed. Please consult us again if new need arises.  Derenda FennelBashira Emmersen Garraway, MSW, LCSWA [phone] 01/14/2016 12:06 PM

## 2016-01-14 NOTE — H&P (Addendum)
Physical Medicine and Rehabilitation Admission H&P   Chief Complaint  Patient presents with  . Code Stroke  : HPI: Jon [name] is a 60 y.o. right handed male with history of hypertension As well as alcohol use. Patient on no prescription medications. He is married and worked full-time prior to admission. Wife can assist on discharge. Presented 01/08/2016 with acute onset of headache and left-sided weakness. Initial blood pressure was reportedly elevated. CT of the head showed right basal ganglia hemorrhage with 31 mL volume. 3 mm midline shift. CT angiogram head and neck with no intracranial or extracranial stenosis dissection or aneurysm. Echocardiogram with ejection fraction of 65% no wall motion abnormalities. Patient did not receive TPA secondary to [place]. Subcutaneous heparin for DVT prophylaxis added 01/10/2016 Dysphagia #1 nectar thick liquid diet. Physical therapy evaluation completed 01/10/2016 with recommendations of physical medicine rehabilitation consult. The patient's wife notes the patient is not motivated by nature and if he could produce "lying in bed all day.Patient was admitted for a comprehensive rehabilitation program  First bowel movement  Today, incontinent. Headache  primary complaint, This has persisted since admission.  ROS Constitutional: Negative for fever and chills.  Eyes: Positive for blurred vision. Negative for double vision.  Respiratory: Negative for cough and shortness of breath.  Cardiovascular: Negative for chest pain, palpitations and leg swelling.  Gastrointestinal: Positive for constipation. Negative for nausea and vomiting.  Genitourinary: Negative for dysuria and hematuria.  Musculoskeletal: Positive for myalgias.  Skin: Negative for rash.  Neurological: Positive for weakness and headaches. Negative for seizures and loss of consciousness.  All other systems reviewed and are negative   Past Medical History  Diagnosis Date  .  Hypertension   . Liver damage    History reviewed. No pertinent past surgical history. No family history on file. Social History:  reports that he has never smoked. He does not have any smokeless tobacco history on file. He reports that he drinks about 7.2 oz of alcohol per week. He reports that he does not use illicit drugs. Allergies:  Allergies  Allergen Reactions  . Gluten Meal Nausea And Vomiting  . Lactose Intolerance (Gi) Nausea And Vomiting  . Other     Tree nuts   . Shellfish Allergy Swelling and Rash   No prescriptions prior to admission    Home: Home Living Family/patient expects to be discharged to:: Inpatient rehab Living Arrangements: Spouse/significant other Available Help at Discharge: Family Type of Home: House Home Access: Stairs to enter Technical brewer of Steps: 2 Home Layout: One level Bathroom Shower/Tub: Chiropodist: Standard Home Equipment: None Lives With: Spouse  Functional History: Prior Function Level of Independence: Independent  Functional Status:  Mobility: Bed Mobility Overal bed mobility: Needs Assistance, +2 for physical assistance Bed Mobility: Supine to Sit, Sit to Supine Supine to sit: Max assist, +2 for physical assistance Sit to supine: Max assist, +2 for physical assistance General bed mobility comments: patient required 2 person max assist to come to EOB, patient was able to use RUE to pull to sitting and RLE movement to get to EOB. No active movement of LUE; some adduction of LLE. Transfers Overall transfer level: Needs assistance Equipment used: (back of recliner chair) Transfers: Sit to/from Stand Sit to Stand: Max assist, +2 physical assistance General transfer comment: +2 max assist to come to standing x3 with LLE blocked out and facilitation of upright postures through hips and chest. Approximation through LUE. Heavy left lateral lean and difficulty managing  secretions. No active engagement in LLE.      ADL: ADL Overall ADL's : Needs assistance/impaired Eating/Feeding: Moderate assistance, Sitting Grooming: Minimal assistance, Sitting, Standing, Cueing for sequencing Grooming Details (indicate cue type and reason): to wipe mouth with increased secretions Upper Body Bathing: Moderate assistance, Sitting, Cueing for sequencing Lower Body Bathing: Maximal assistance, +2 for physical assistance, Sit to/from stand, Cueing for sequencing Upper Body Dressing : Moderate assistance, Sitting Upper Body Dressing Details (indicate cue type and reason): Pt able to raise R arm and place into gown with max verbal cues. Lower Body Dressing: Maximal assistance, +2 for physical assistance, Sit to/from stand General ADL Comments: Only performed sit to stand this session with max assist +2. Educated wife on proper positioning of LUE, sensory input to LUE, and getting pt to attempt to use LUE.  Cognition: Cognition Overall Cognitive Status: Impaired/Different from baseline Arousal/Alertness: Lethargic Orientation Level: Oriented X4 Attention: Sustained Sustained Attention: Impaired Sustained Attention Impairment: Verbal basic, Functional basic Awareness: Impaired Awareness Impairment: Emergent impairment, Anticipatory impairment Problem Solving: Impaired Problem Solving Impairment: Functional basic Executive Function: Self Monitoring, Organizing Organizing: Impaired Organizing Impairment: Verbal basic Self Monitoring: Impaired Self Monitoring Impairment: Functional basic Behaviors: Impulsive, Perseveration Safety/Judgment: Impaired Comments: pt perseverative on wanting to go to sleep Cognition Arousal/Alertness: Awake/alert Behavior During Therapy: Flat affect, Impulsive Overall Cognitive Status: Impaired/Different from baseline Area of Impairment: Memory, Following commands, Safety/judgement, Attention, Problem solving Current Attention Level:  Focused Memory: Decreased short-term memory Following Commands: Follows one step commands with increased time Safety/Judgement: Decreased awareness of safety, Decreased awareness of deficits Awareness: Intellectual Problem Solving: Slow processing, Decreased initiation, Difficulty sequencing, Requires verbal cues, Requires tactile cues General Comments: Left inattention noted. Making up things he sees on his left side based on what he knows is normally near a sink.  Physical Exam: Blood pressure 133/62, pulse 70, temperature 97.9 F (36.6 C), temperature source Oral, resp. rate 20, height '5\' 8"'  (1.727 m), weight 90.719 kg (200 lb), SpO2 100 %. Physical Exam Constitutional: He appears well-developed and well-nourished.  HENT:  Head: Normocephalic and atraumatic.  Eyes: Conjunctivae are normal.  Pupils reactive to light  Neck: Normal range of motion. Neck supple. No thyromegaly present.  Cardiovascular: Normal rate and regular rhythm.  Respiratory: Effort normal and breath sounds normal. No respiratory distress.  GI: Soft. Bowel sounds are normal. He exhibits no distension.  Musculoskeletal: He exhibits no edema or tenderness.  Neurological: He is alert.  Speech is dysarthric but intelligible with apraxia.  He did follow simple commands.  Alert and oriented 3 with increased time Absent sensation in left upper and left lower extremity Left facial droop Motor: Right upper extremity/right lower extremity 5/5 proximal to distal Left upper extremity/left lower extremity 0/5 proximal to distal  sensationdifficult to assess secondary to poor concentration. He does wince slightly to pinch in the upper and lower extremity on the left side Skin: Skin is warm and dry. Midline gluteal  Cleft fissure ,  Left antecubital fossa, erythema, small open area, and no drainage Psychiatric: Patient is talkative but inappropriate. Poor attention and concentration intermittently closes his eyesand  says I have a headache but then he becomes conversational after that.    Lab Results Last 48 Hours    Results for orders placed or performed during the hospital encounter of 01/08/16 (from the past 48 hour(s))  Glucose, capillary Status: Abnormal   Collection Time: 01/10/16 7:19 AM  Result Value Ref Range   Glucose-Capillary 125 (H) 65 - 99  mg/dL  Glucose, capillary Status: Abnormal   Collection Time: 01/10/16 11:19 AM  Result Value Ref Range   Glucose-Capillary 167 (H) 65 - 99 mg/dL  Glucose, capillary Status: Abnormal   Collection Time: 01/10/16 4:32 PM  Result Value Ref Range   Glucose-Capillary 106 (H) 65 - 99 mg/dL   Comment 1 Notify RN    Comment 2 Document in Chart   Glucose, capillary Status: Abnormal   Collection Time: 01/10/16 9:15 PM  Result Value Ref Range   Glucose-Capillary 102 (H) 65 - 99 mg/dL   Comment 1 Notify RN    Comment 2 Document in Chart   Glucose, capillary Status: None   Collection Time: 01/11/16 6:46 AM  Result Value Ref Range   Glucose-Capillary 92 65 - 99 mg/dL   Comment 1 Notify RN    Comment 2 Document in Chart   CBC Status: Abnormal   Collection Time: 01/11/16 8:02 AM  Result Value Ref Range   WBC 8.8 4.0 - 10.5 K/uL   RBC 4.20 (L) 4.22 - 5.81 MIL/uL   Hemoglobin 14.3 13.0 - 17.0 g/dL   HCT 42.8 39.0 - 52.0 %   MCV 101.9 (H) 78.0 - 100.0 fL   MCH 34.0 26.0 - 34.0 pg   MCHC 33.4 30.0 - 36.0 g/dL   RDW 12.1 11.5 - 15.5 %   Platelets 134 (L) 150 - 400 K/uL  Basic metabolic panel Status: Abnormal   Collection Time: 01/11/16 8:02 AM  Result Value Ref Range   Sodium 134 (L) 135 - 145 mmol/L   Potassium 4.0 3.5 - 5.1 mmol/L   Chloride 103 101 - 111 mmol/L   CO2 22 22 - 32 mmol/L   Glucose, Bld 95 65 - 99 mg/dL   BUN 8 6 - 20 mg/dL   Creatinine, Ser 0.54 (L)  0.61 - 1.24 mg/dL   Calcium 9.0 8.9 - 10.3 mg/dL   GFR calc non Af Amer >60 >60 mL/min   GFR calc Af Amer >60 >60 mL/min    Comment: (NOTE) The eGFR has been calculated using the CKD EPI equation. This calculation has not been validated in all clinical situations. eGFR's persistently <60 mL/min signify possible Chronic Kidney Disease.    Anion gap 9 5 - 15  Troponin I Status: None   Collection Time: 01/11/16 11:09 AM  Result Value Ref Range   Troponin I <0.03 <0.031 ng/mL    Comment:   NO INDICATION OF MYOCARDIAL INJURY.   Glucose, capillary Status: Abnormal   Collection Time: 01/11/16 11:44 AM  Result Value Ref Range   Glucose-Capillary 104 (H) 65 - 99 mg/dL   Comment 1 Notify RN    Comment 2 Document in Chart   Glucose, capillary Status: Abnormal   Collection Time: 01/11/16 4:16 PM  Result Value Ref Range   Glucose-Capillary 101 (H) 65 - 99 mg/dL  Troponin I Status: None   Collection Time: 01/11/16 4:19 PM  Result Value Ref Range   Troponin I <0.03 <0.031 ng/mL    Comment:   NO INDICATION OF MYOCARDIAL INJURY.   Glucose, capillary Status: Abnormal   Collection Time: 01/11/16 9:18 PM  Result Value Ref Range   Glucose-Capillary 110 (H) 65 - 99 mg/dL   Comment 1 Notify RN    Comment 2 Document in Chart   Glucose, capillary Status: None   Collection Time: 01/12/16 6:04 AM  Result Value Ref Range   Glucose-Capillary 96 65 - 99 mg/dL   Comment 1 Notify RN  Comment 2 Document in Chart       Imaging Results (Last 48 hours)    No results found.       Medical Problem List and Plan: 1.Left hemiplegia secondary to right basal ganglia hemorrhage secondary to hypertensive crisis 2. DVT Prophylaxis/Anticoagulation: Subcutaneous heparin initiated 01/10/2016. Monitor for any bleeding episodes 3. Pain  Management/headaches: Tylenol 3 as needed, Post stroke headaches start Topamax 25 twice a day May need to titrate upwards 4. Dysphagia. Dysphagia #1 nectar liquids. Follow-up speech therapy. 5. Neuropsych: This patient is capable of making decisions on his own behalf. 6. Skin/Wound Care: Routine skin checks, Start Keflex for left antecubital fossa  Cellulitis, superficial 7. Fluids/Electrolytes/Nutrition: Routine I&O's with follow-up chemistries 8. Hypertension. Norvasc 10 mg daily, Lopressor 75 mg twice a day. Monitor with increased mobility 9. History of alcohol use. Provided counseling monitor for any signs of withdrawal 10. Hyperlipidemia. Pravachol 11. Mood. Zoloft 50 mg daily. Provide emotional support 12. Urinary tract infection will start Keflex pending culture results  Post Admission Physician Evaluation: 1. Functional deficits secondary to Right Basal ganglia intracranial hemorrhage. 2. Patient is admitted to receive collaborative, interdisciplinary care between the physiatrist, rehab nursing staff, and therapy team. 3. Patient's level of medical complexity and substantial therapy needs in context of that medical necessity cannot be provided at a lesser intensity of care such as a SNF. 4. Patient has experienced substantial functional loss from his/her baseline which was documented above under the "Functional History" and "Functional Status" headings. Judging by the patient's diagnosis, physical exam, and functional history, the patient has potential for functional progress which will result in measurable gains while on inpatient rehab. These gains will be of substantial and practical use upon discharge in facilitating mobility and self-care at the household level. 5. Physiatrist will provide 24 hour management of medical needs as well as oversight of the therapy plan/treatment and provide guidance as appropriate regarding the interaction of the two. 6. 24 hour rehab nursing will  assist with bladder management, bowel management, safety, skin/wound care, disease management, medication administration, pain management and patient education and help integrate therapy concepts, techniques,education, etc. 7. PT will assess and treat for/with: pre gait, gait training, endurance , safety, equipment, neuromuscular re education. Goals are: Min to mod assist. 8. OT will assess and treat for/with: ADLs, Cognitive perceptual skills, Neuromuscular re education, safety, endurance, equipment. Goals are: Min to mod assist. Therapy May proceed with showering this patient. 9. SLP will assess and treat for/with: Cognition, swallowing. Goals are: Safe and adequate by mouth intake, supervision with medication management. 10. Case Management and Social Worker will assess and treat for psychological issues and discharge planning. 11. Team conference will be held weekly to assess progress toward goals and to determine barriers to discharge. 12. Patient will receive at least 3 hours of therapy per day at least 5 days per week. 13. ELOS: 20-22 d  14. Prognosis: good     Charlett Blake M.D. [facility] FAAPM&R (Sports Med, Neuromuscular Med) Diplomate Am Board of Electrodiagnostic Med  01/12/2016

## 2016-01-14 NOTE — Progress Notes (Signed)
Patient ID: Jon [name], male   DOB: 06-17-1956, 60 y.o.   MRN: [id number] Patient admitted to 4W06 via bed, escorted by nursing staff and family.  Patient and spouse verbalized understanding of rehab process, signed fall safety agreement.  Dr. Wynn BankerKirsteins present to assess patient as well.  Skin assessed with second nurse. Appears to be in no immediate distress at this time.   Dani Gobbleeardon, Jon Fuston J, RN

## 2016-01-14 NOTE — Progress Notes (Signed)
Patient is being transferred to rehab. Report called to the receiving nurse.

## 2016-01-14 NOTE — Progress Notes (Addendum)
Inpatient Rehabilitation  Working on bed availability to try to accommodate this patient as a potential admission for the day. Have notified CSW and RN CM. Will follow up shortly. Please call with questions.  Update: Authorization received, bed available, and plan to admit patient today, team updated.  Charlane FerrettiMelissa Kaylenn Civil, M.A., CCC/SLP Admission Coordinator  [facility]  Cell [phone]

## 2016-01-14 NOTE — Progress Notes (Signed)
Physical Therapy Treatment Patient Details Name: Jon [name] MRN: [id number] DOB: [birth year] Today's Date: 01/14/2016    History of Present Illness 60 y.o. male with history of hypertension, alcohol abuse, and liver damage presenting with severe headache, left-sided weakness, slurred speech, and elevated blood pressure. patient found to have ICH: right basal ganglia hemorrhage likely due to hypertension    PT Comments    Patient progressing slowly towards PT goals. Tolerated dynamic sitting balance EOB performing ADLs with focus on attending to left side and manual cues for facilitation of trunk musculature. Pt able to problem solve with increased time and cues to open tooth brush wrapper, tooth paste etc. Continues to require assist of 2 to transfer to chair due to dense left hemiplegia. Will continue to follow.   Follow Up Recommendations  CIR     Equipment Recommendations  Wheelchair (measurements PT);Wheelchair cushion (measurements PT)    Recommendations for Other Services       Precautions / Restrictions Precautions Precautions: Fall Restrictions Weight Bearing Restrictions: No    Mobility  Bed Mobility Overal bed mobility: Needs Assistance Bed Mobility: Supine to Sit     Supine to sit: Max assist;HOB elevated     General bed mobility comments: Therapist stabilizing Rt heel and pt able to push through RLE and scoot bottom towards left to get to EOB. Assist with LLE and trunk to come to sitting.  Transfers Overall transfer level: Needs assistance Equipment used: 2 person hand held assist Transfers: Squat Pivot Transfers Sit to Stand: Max assist;+2 physical assistance   Squat pivot transfers: Max assist;+2 physical assistance     General transfer comment: + 2 Max A squat pivot transfer to chair with manual/verbal cues for hand placement. Assist managing LLE during transfer.   Ambulation/Gait                 Stairs            Wheelchair  Mobility    Modified Rankin (Stroke Patients Only) Modified Rankin (Stroke Patients Only) Pre-Morbid Rankin Score: No symptoms Modified Rankin: Severe disability     Balance Overall balance assessment: Needs assistance Sitting-balance support: Feet supported;Single extremity supported Sitting balance-Leahy Scale: Zero Sitting balance - Comments: Variable assist needed- Min A-Max A sitting EOB with left lateral lean, no pushing noted today. manual cues for facilitation of thoracic and lumbar extension and upright posture. Able to self correct with multimodal cues but not able to maintain. Sat EOB performing ADLs and attending to left side ~15 mins Postural control: Left lateral lean Standing balance support: During functional activity Standing balance-Leahy Scale: Zero                      Cognition Arousal/Alertness: Awake/alert Behavior During Therapy: Flat affect Overall Cognitive Status: Impaired/Different from baseline Area of Impairment: Attention;Memory;Safety/judgement;Following commands;Problem solving;Awareness   Current Attention Level: Sustained Memory: Decreased short-term memory Following Commands: Follows one step commands with increased time (multimodal cues) Safety/Judgement: Decreased awareness of safety;Decreased awareness of deficits Awareness: Emergent Problem Solving: Slow processing;Decreased initiation;Difficulty sequencing;Requires verbal cues;Requires tactile cues General Comments: Continues to have left inattention, able to gaze L with cues but does not sustain.    Exercises      General Comments General comments (skin integrity, edema, etc.): Wife present during session. Took pt outside to help with mood.      Pertinent Vitals/Pain Pain Assessment: No/denies pain Pain Score: 2  Faces Pain Scale: Hurts a little bit Pain Location: left  arm, head Pain Intervention(s): Ice applied;Other (comment) (rn made aware of left arm IV concerns and  arrived to room to remove it)    Home Living                      Prior Function Level of Independence: Independent          PT Goals (current goals can now be found in the care plan section) Acute Rehab PT Goals Patient Stated Goal: lay back down Progress towards PT goals: Progressing toward goals (slowly)    Frequency  Min 4X/week    PT Plan Current plan remains appropriate    Co-evaluation PT/OT/SLP Co-Evaluation/Treatment: Yes Reason for Co-Treatment: Complexity of the patient's impairments (multi-system involvement);For patient/therapist safety PT goals addressed during session: Mobility/safety with mobility;Strengthening/ROM OT goals addressed during session: ADL's and self-care;Other (comment) (mobility)     End of Session Equipment Utilized During Treatment: Gait belt Activity Tolerance: Patient tolerated treatment well Patient left: in chair;with call bell/phone within reach;with chair alarm set;with family/visitor present     Time: [id number] PT Time Calculation (min) (ACUTE ONLY): 39 min  Charges:  $Therapeutic Activity: 8-22 mins $Neuromuscular Re-education: 8-22 mins                    G Codes:      Rachelle Edwards A Helaman [place] 01/14/2016, 12:03 PM Jon [name], PT, DPT [phone]

## 2016-01-14 NOTE — Progress Notes (Signed)
[name], MD Physician Signed Physical Medicine and Rehabilitation Consult Note 01/11/2016 8:44 AM  Related encounter: ED to Hosp-Admission (Discharged) from 01/08/2016 in [facility] 82M NEURO MEDICAL    Expand All Collapse All        Physical Medicine and Rehabilitation Consult Reason for Consult: Right basal ganglia hemorrhage secondary to hypertensive crisis Referring Physician: Dr.Xu   HPI: Jon [name] is a 60 y.o. right handed male with history of hypertension on no prescription medications. Patient is married and worked full-time prior to admission. Wife can assist on discharge. Presented 01/08/2016 with acute onset of headache and left-sided weakness. Initial blood pressure was reportedly elevated. CT of the head showed right basal ganglia hemorrhage with 31 mL volume. 3 mm midline shift. CT angiogram head and neck with no intracranial or extracranial stenosis dissection or aneurysm. Echocardiogram with ejection fraction of 65% no wall motion abnormalities. Patient did not receive TPA secondary to ICH. Subcutaneous heparin for DVT prophylaxis added 01/10/2016 Dysphagia #1 nectar thick liquid diet. Physical therapy evaluation completed 01/10/2016 with recommendations of physical medicine rehabilitation consult. The patient's wife notes the patient is not motivated by nature and if he could produce "lying in bed all day."   Review of Systems  Constitutional: Negative for fever and chills.  Eyes: Positive for blurred vision. Negative for double vision.  Respiratory: Negative for cough and shortness of breath.  Cardiovascular: Negative for chest pain, palpitations and leg swelling.  Gastrointestinal: Positive for constipation. Negative for nausea and vomiting.  Genitourinary: Negative for dysuria and hematuria.  Musculoskeletal: Positive for myalgias.  Skin: Negative for rash.  Neurological: Positive for weakness and headaches. Negative for seizures and loss  of consciousness.  All other systems reviewed and are negative.  Past Medical History  Diagnosis Date  . Hypertension   . Liver damage    History reviewed. No pertinent past surgical history. No pertinent family history on file. Social History:  reports that he has never smoked. He does not have any smokeless tobacco history on file. He reports that he drinks about 7.2 oz of alcohol per week. He reports that he does not use illicit drugs. Allergies:  Allergies  Allergen Reactions  . Gluten Meal Nausea And Vomiting  . Lactose Intolerance (Gi) Nausea And Vomiting  . Other     Tree nuts   . Shellfish Allergy Swelling and Rash   No prescriptions prior to admission    Home: Home Living Family/patient expects to be discharged to:: Inpatient rehab Living Arrangements: Spouse/significant other Type of Home: House Lives With: Spouse  Functional History: Prior Function Level of Independence: Independent Functional Status:  Mobility: Bed Mobility Overal bed mobility: Needs Assistance, +2 for physical assistance Bed Mobility: Supine to Sit, Sit to Supine Supine to sit: Max assist, +2 for physical assistance Sit to supine: Max assist, +2 for physical assistance General bed mobility comments: patient required 2 person max assist to come to EOB, patient was able to demonstrate some functional use of RUE to pull to sitting and RLE movement to EOB. Patient complained of back pain during transition Transfers Overall transfer level: Needs assistance Transfers: Sit to/from Stand Sit to Stand: Max assist, +2 physical assistance General transfer comment: +2 max assist to come to standing x3 with LLE blocked out and faciliatation of upright postures provided. Face to face with gait belt support and 2 persons required. No active engagement in LLE.      ADL:    Cognition: Cognition Overall  Cognitive Status: Impaired/Different from  baseline Arousal/Alertness: Lethargic Orientation Level: Oriented X4 Attention: Sustained Sustained Attention: Impaired Sustained Attention Impairment: Verbal basic, Functional basic Awareness: Impaired Awareness Impairment: Emergent impairment, Anticipatory impairment Problem Solving: Impaired Problem Solving Impairment: Functional basic Executive Function: Self Monitoring, Organizing Organizing: Impaired Organizing Impairment: Verbal basic Self Monitoring: Impaired Self Monitoring Impairment: Functional basic Behaviors: Impulsive, Perseveration Safety/Judgment: Impaired Comments: pt perseverative on wanting to go to sleep Cognition Arousal/Alertness: Awake/alert Behavior During Therapy: Restless, Impulsive, Flat affect Overall Cognitive Status: Impaired/Different from baseline Area of Impairment: Attention, Memory, Following commands, Safety/judgement, Awareness, Problem solving Current Attention Level: Focused Following Commands: Follows one step commands with increased time Safety/Judgement: Decreased awareness of safety, Decreased awareness of deficits Awareness: Intellectual Problem Solving: Slow processing, Decreased initiation, Difficulty sequencing, Requires verbal cues, Requires tactile cues General Comments: Patient with noted left side inattention, some disorganized thoughts intermittently throughout session, easily distracted cues for re-direction to task.  Blood pressure 137/77, pulse 81, temperature 99 F (37.2 C), temperature source Oral, resp. rate 18, height 5\' 8"  (1.727 m), weight 90.719 kg (200 lb), SpO2 95 %. Physical Exam  Vitals reviewed. Constitutional: He appears well-developed and well-nourished.  HENT:  Head: Normocephalic and atraumatic.  Eyes: Conjunctivae are normal.  Pupils reactive to light  Neck: Normal range of motion. Neck supple. No thyromegaly present.  Cardiovascular: Normal rate and regular rhythm.  Respiratory: Effort normal and  breath sounds normal. No respiratory distress.  GI: Soft. Bowel sounds are normal. He exhibits no distension.  Musculoskeletal: He exhibits no edema or tenderness.  Neurological: He is alert.  Speech is dysarthric but intelligible with apraxia.  He did follow simple commands.  Alert and oriented 3 with increased time Absent sensation in left upper and left lower extremity Left facial droop Motor: Right upper extremity/right lower extremity 5/5 proximal to distal Left upper extremity/left lower extremity 0/5 proximal to distal  Skin: Skin is warm and dry.  Psychiatric: He has a normal mood and affect. His behavior is normal.     Lab Results Last 24 Hours    Results for orders placed or performed during the hospital encounter of 01/08/16 (from the past 24 hour(s))  Glucose, capillary Status: Abnormal   Collection Time: 01/10/16 11:19 AM  Result Value Ref Range   Glucose-Capillary 167 (H) 65 - 99 mg/dL  Glucose, capillary Status: Abnormal   Collection Time: 01/10/16 4:32 PM  Result Value Ref Range   Glucose-Capillary 106 (H) 65 - 99 mg/dL   Comment 1 Notify RN    Comment 2 Document in Chart   Glucose, capillary Status: Abnormal   Collection Time: 01/10/16 9:15 PM  Result Value Ref Range   Glucose-Capillary 102 (H) 65 - 99 mg/dL   Comment 1 Notify RN    Comment 2 Document in Chart   Glucose, capillary Status: None   Collection Time: 01/11/16 6:46 AM  Result Value Ref Range   Glucose-Capillary 92 65 - 99 mg/dL   Comment 1 Notify RN    Comment 2 Document in Chart   CBC Status: Abnormal   Collection Time: 01/11/16 8:02 AM  Result Value Ref Range   WBC 8.8 4.0 - 10.5 K/uL   RBC 4.20 (L) 4.22 - 5.81 MIL/uL   Hemoglobin 14.3 13.0 - 17.0 g/dL   HCT 69.642.8 29.539.0 - 28.452.0 %   MCV 101.9 (H) 78.0 - 100.0 fL   MCH 34.0 26.0 - 34.0 pg   MCHC 33.4 30.0 - 36.0 g/dL    RDW 12.1  11.5 - 15.5 %   Platelets 134 (L) 150 - 400 K/uL      Imaging Results (Last 48 hours)    Ct Angio Head W Or Wo Contrast  01/09/2016 CLINICAL DATA: Continued surveillance intracerebral hematoma. LEFT-sided weakness. History of hypertension. EXAM: CT ANGIOGRAPHY HEAD AND NECK TECHNIQUE: Multidetector CT imaging of the head and neck was performed using the standard protocol during bolus administration of intravenous contrast. Multiplanar CT image reconstructions and MIPs were obtained to evaluate the vascular anatomy. Carotid stenosis measurements (when applicable) are obtained utilizing NASCET criteria, using the distal internal carotid diameter as the denominator. CONTRAST: Isovue 370, 50 mL. COMPARISON: CT head 01/08/2016. FINDINGS: CT HEAD Calvarium and skull base: No fracture or destructive lesion. Mastoids and middle ears are grossly clear. Paranasal sinuses: Imaged portions are clear. Orbits: Negative. Brain: Intracerebral hematoma redemonstrated, slight increase in surrounding edema. Slight mass effect on the RIGHT lateral ventricle, with unchanged 3 mm RIGHT-to-LEFT shift. Measurements today are 32 x 59 x 34 mm, corresponding to a volume of 32 cc, essentially unchanged. CTA NECK Aortic arch: Standard branching. Imaged portion shows no evidence of aneurysm or dissection. No significant stenosis of the major arch vessel origins. Right carotid system: No evidence of dissection, stenosis (50% or greater) or occlusion. Left carotid system: No evidence of dissection, stenosis (50% or greater) or occlusion. Vertebral arteries: LEFT vertebral dominant. No evidence of dissection, stenosis (50% or greater) or occlusion. Nonvascular soft tissues: Mild spondylosis. No neck masses. Lung apices are clear. No osseous lesions. CTA HEAD Anterior circulation: No significant stenosis, proximal occlusion, aneurysm, or vascular malformation. Posterior circulation: No significant stenosis, proximal  occlusion, aneurysm, or vascular malformation. Venous sinuses: As permitted by contrast timing, patent. Anatomic variants: Fetal origin LEFT PCA. Delayed phase: No abnormal intracranial enhancement. IMPRESSION: Unchanged RIGHT basal ganglia hemorrhage, favored to represent a hypertensive related bleed. Volume today 32 mm, essentially unchanged. Mild 3 mm RIGHT-to-LEFT shift. No intracranial or extracranial stenosis, dissection, aneurysm, or vascular malformation. Electronically Signed By: Elsie Stain M.D. On: 01/09/2016 09:48   Ct Angio Neck W Or Wo Contrast  01/09/2016 CLINICAL DATA: Continued surveillance intracerebral hematoma. LEFT-sided weakness. History of hypertension. EXAM: CT ANGIOGRAPHY HEAD AND NECK TECHNIQUE: Multidetector CT imaging of the head and neck was performed using the standard protocol during bolus administration of intravenous contrast. Multiplanar CT image reconstructions and MIPs were obtained to evaluate the vascular anatomy. Carotid stenosis measurements (when applicable) are obtained utilizing NASCET criteria, using the distal internal carotid diameter as the denominator. CONTRAST: Isovue 370, 50 mL. COMPARISON: CT head 01/08/2016. FINDINGS: CT HEAD Calvarium and skull base: No fracture or destructive lesion. Mastoids and middle ears are grossly clear. Paranasal sinuses: Imaged portions are clear. Orbits: Negative. Brain: Intracerebral hematoma redemonstrated, slight increase in surrounding edema. Slight mass effect on the RIGHT lateral ventricle, with unchanged 3 mm RIGHT-to-LEFT shift. Measurements today are 32 x 59 x 34 mm, corresponding to a volume of 32 cc, essentially unchanged. CTA NECK Aortic arch: Standard branching. Imaged portion shows no evidence of aneurysm or dissection. No significant stenosis of the major arch vessel origins. Right carotid system: No evidence of dissection, stenosis (50% or greater) or occlusion. Left carotid system: No evidence of  dissection, stenosis (50% or greater) or occlusion. Vertebral arteries: LEFT vertebral dominant. No evidence of dissection, stenosis (50% or greater) or occlusion. Nonvascular soft tissues: Mild spondylosis. No neck masses. Lung apices are clear. No osseous lesions. CTA HEAD Anterior circulation: No significant stenosis, proximal occlusion, aneurysm,  or vascular malformation. Posterior circulation: No significant stenosis, proximal occlusion, aneurysm, or vascular malformation. Venous sinuses: As permitted by contrast timing, patent. Anatomic variants: Fetal origin LEFT PCA. Delayed phase: No abnormal intracranial enhancement. IMPRESSION: Unchanged RIGHT basal ganglia hemorrhage, favored to represent a hypertensive related bleed. Volume today 32 mm, essentially unchanged. Mild 3 mm RIGHT-to-LEFT shift. No intracranial or extracranial stenosis, dissection, aneurysm, or vascular malformation. Electronically Signed By: Elsie Stain M.D. On: 01/09/2016 09:48   Dg Chest Port 1 View  01/09/2016 CLINICAL DATA: Acute right basal ganglia hemorrhage. EXAM: PORTABLE CHEST 1 VIEW COMPARISON: None. FINDINGS: Cardiomediastinal silhouette is normal. Mediastinal contours appear intact. There is no evidence of focal airspace consolidation, pleural effusion or pneumothorax. Osseous structures are without acute abnormality. Soft tissues are grossly normal. IMPRESSION: No active disease. Electronically Signed By: Ted Mcalpine M.D. On: 01/09/2016 11:07     Assessment/Plan: Diagnosis: Right basal ganglia hemorrhage Labs and images independently reviewed. Records reviewed and summated above. Stroke: Continue secondary stroke prophylaxis and Risk Factor Modification listed below:  Antiplatelet therapy:  Blood Pressure Management: Continue current medication with prn's with permisive HTN per primary team Left sided hemiparesis: fit for orthosis to prevent contractures (resting hand splint for day,  wrist cock up splint at night, PRAFO, etc) Motor recovery: Fluoxetine  1. Does the need for close, 24 hr/day medical supervision in concert with the patient's rehab needs make it unreasonable for this patient to be served in a less intensive setting? Yes  2. Co-Morbidities requiring supervision/potential complications: HTN (monitor and provide prns in accordance with increased physical exertion and pain), Dysphagia (cont SLP, advance diet as tolerated), hyponatremia (cont to monitor, treat if necessary), Thrombocytopenia (< 60,000/mm3 no resistive exercise), post stroke gait disturbance 3. Due to bladder management, safety, skin/wound care, disease management, medication administration and patient education, does the patient require 24 hr/day rehab nursing? Yes 4. Does the patient require coordinated care of a physician, rehab nurse, PT (1-2 hrs/day, 5 days/week), OT (1-2 hrs/day, 5 days/week) and SLP (1-2 hrs/day, 5 days/week) to address physical and functional deficits in the context of the above medical diagnosis(es)? Yes Addressing deficits in the following areas: balance, endurance, locomotion, strength, transferring, bowel/bladder control, bathing, dressing, toileting, cognition, speech, language, swallowing and psychosocial support 5. Can the patient actively participate in an intensive therapy program of at least 3 hrs of therapy per day at least 5 days per week? Potentially 6. The potential for patient to make measurable gains while on inpatient rehab is excellent 7. Anticipated functional outcomes upon discharge from inpatient rehab are min assist and mod assist with PT, min assist and mod assist with OT, supervision and min assist with SLP. 8. Estimated rehab length of stay to reach the above functional goals is: 20-24 days. 9. Does the patient have adequate social supports and living environment to accommodate these discharge functional goals? Potentially 10. Anticipated D/C setting:  SNF 11. Anticipated post D/C treatments: SNF 12. Overall Rehab/Functional Prognosis: good  RECOMMENDATIONS: This patient's condition is appropriate for continued rehabilitative care in the following setting: Pt will likely require significant support at discharge, which his wife may not solely be able to provide. Will need to clarify additional caregiver support at discharge. If this is not present, recommend SNF. Patient has agreed to participate in recommended program. Potentially Note that insurance prior authorization may be required for reimbursement for recommended care.  Comment: Rehab Admissions Coordinator to follow up.  Maryla Morrow, MD 01/11/2016       Revision  History     Date/Time User Provider Type Action   01/11/2016 3:11 PM Ankit Karis JubaAnil Patel, MD Physician Sign   01/11/2016 8:54 AM Charlton Amoraniel J Angiulli, PA-C Physician Assistant [place]   View Details Report       Routing History     Date/Time From To Method   01/11/2016 3:11 PM Ankit Karis JubaAnil Patel, MD Provider Not In System In Basket

## 2016-01-14 NOTE — Progress Notes (Signed)
Occupational Therapy Treatment Patient Details Name: Jon [name] MRN: [id number] DOB: 19-Jun-1956 Today's Date: 01/14/2016    History of present illness 60 y.o. male with history of hypertension, alcohol abuse, and liver damage presenting with severe headache, left-sided weakness, slurred speech, and elevated blood pressure. patient found to have ICH: right basal ganglia hemorrhage likely due to hypertension   OT comments  Pt making gradual progress toward OT goals this session; needs mild encouragement to participate with therapy. Pt tolerating sitting EOB for ~10 minutes this session with max assist for balance and weight bearing through LUE. Pt participated in grooming activities sitting EOB with mod assist and max multimodal cues to visually scan toward L side. Pt able to perform squat pivot transfer from EOB to chair with max assist +2 today. D/c plan remains appropriate. Will continue to follow acutely.   Follow Up Recommendations  CIR;Supervision/Assistance - 24 hour    Equipment Recommendations  Other (comment) (TBD at next venue)    Recommendations for Other Services      Precautions / Restrictions Precautions Precautions: Fall Restrictions Weight Bearing Restrictions: No       Mobility Bed Mobility Overal bed mobility: Needs Assistance Bed Mobility: Supine to Sit     Supine to sit: Max assist;HOB elevated     General bed mobility comments: Therapist stabilizing Rt heel and pt able to push through RLE and scoot bottom towards left to get to EOB. Assist with LLE and trunk to come to sitting.  Transfers Overall transfer level: Needs assistance Equipment used: 2 person hand held assist Transfers: Squat Pivot Transfers     Squat pivot transfers: Max assist;+2 physical assistance     General transfer comment: + 2 Max A squat pivot transfer to chair with manual/verbal cues for hand placement. Assist managing LLE during transfer.     Balance Overall balance  assessment: Needs assistance Sitting-balance support: Feet supported;Single extremity supported Sitting balance-Leahy Scale: Zero Sitting balance - Comments: Variable assist needed- Min A-Max A sitting EOB with left lateral lean, no pushing noted today. manual cues for facilitation of thoracic and lumbar extension and upright posture. Able to self correct with multimodal cues but not able to maintain. Sat EOB performing ADLs and attending to left side ~15 mins Postural control: Left lateral lean Standing balance support: During functional activity;Bilateral upper extremity supported Standing balance-Leahy Scale: Zero                     ADL Overall ADL's : Needs assistance/impaired     Grooming: Wash/dry face;Oral care;Moderate assistance;Cueing for sequencing;Sitting Grooming Details (indicate cue type and reason): Max multimodal cues to scan to L to find objects to perform grooming. Assist to open toothbrush/toothpaste and apply toothpaste to toothbrush         Upper Body Dressing : Moderate assistance;Sitting;Cueing for sequencing Upper Body Dressing Details (indicate cue type and reason): Max verbal cueing                 Functional mobility during ADLs: Maximal assistance;+2 for physical assistance (squat pivot only) General ADL Comments: Pt able to visually scan to L side with multimodal cues but unable to sustain. Performed WB through LUE while sitting EOB with PT.      Vision                     Perception     Praxis      Cognition   Behavior During Therapy: Flat affect  Overall Cognitive Status: Impaired/Different from baseline Area of Impairment: Attention;Memory;Safety/judgement;Following commands;Problem solving;Awareness   Current Attention Level: Sustained Memory: Decreased short-term memory  Following Commands: Follows one step commands with increased time (multimodal cues) Safety/Judgement: Decreased awareness of safety;Decreased  awareness of deficits Awareness: Emergent Problem Solving: Slow processing;Decreased initiation;Difficulty sequencing;Requires verbal cues;Requires tactile cues General Comments: Continues to have left inattention, able to gaze L with cues but does not sustain.    Extremity/Trunk Assessment               Exercises     Shoulder Instructions       General Comments      Pertinent Vitals/ Pain       Pain Assessment: No/denies pain Pain Score: 2  Faces Pain Scale: Hurts a little bit Pain Location: left arm, head Pain Intervention(s): Ice applied;Other (comment) (rn made aware of left arm IV concerns and arrived to room to remove it)  Home Living                                          Prior Functioning/Environment Level of Independence: Independent            Frequency Min 2X/week     Progress Toward Goals  OT Goals(current goals can now be found in the care plan section)  Progress towards OT goals: Progressing toward goals  Acute Rehab OT Goals Patient Stated Goal: lay back down OT Goal Formulation: With patient  Plan Discharge plan remains appropriate    Co-evaluation    PT/OT/SLP Co-Evaluation/Treatment: Yes Reason for Co-Treatment: Complexity of the patient's impairments (multi-system involvement);For patient/therapist safety PT goals addressed during session: Mobility/safety with mobility;Strengthening/ROM OT goals addressed during session: ADL's and self-care;Other (comment) (mobility)      End of Session Equipment Utilized During Treatment: Gait belt   Activity Tolerance Patient tolerated treatment well   Patient Left in chair;with call bell/phone within reach;with chair alarm set;with family/visitor present   Nurse Communication          Time: [id number] OT Time Calculation (min): 38 min  Charges: OT General Charges $OT Visit: 1 Procedure OT Treatments $Self Care/Home Management : 8-22 mins  Gaye AlkenBailey A Eriona Kinchen M.S.,  OTR/L Pager: [phone]  01/14/2016, 11:36 AM

## 2016-01-14 NOTE — PMR Pre-admission (Signed)
PMR Admission Coordinator Pre-Admission Assessment  Patient: Jon [name] is an 60 y.o., male MRN: [id number] DOB: 23-Dec-1955 Height: 5\' 8"  (172.7 cm) Weight: 90.719 kg (200 lb)              Insurance Information HMO:     PPO:      PCP:      IPA:      80/20:      OTHER: open access plus PRIMARY: [employer] [place]      Policy#: [id number]      Subscriber: Self CM Name: Dorann LodgeCaroline Coleman      Phone#: [phone]     Fax#: [phone] Pre-Cert#: [id number]      Employer: Full time Benefits:  Phone #: [phone]     Name: Johny DrillingJanel Eff. Date: 9/1/20014     Deduct: $1000      Out of Pocket Max: $3000      Life Max: None CIR: $250 co-pay then 80/20%      SNF: 80%/20% limit 60 days a year Outpatient: PT/OT/SLP     Co-Pay: $50, 20 visits  Home Health: 80%      Co-Pay: 20%, 60 visit limits  DME: 80%     Co-Pay: 20% Providers: in-network   Medicaid Application Date:       Case Manager:  Disability Application Date:       Case Worker:   Emergency Conservator, museum/galleryContact Information Contact Information    Name Relation Home Work Mobile   Westrup,[name] Spouse [phone]  [phone]     Current Medical History  Patient Admitting Diagnosis: Right basal ganglia hemorrhage  History of Present Illness:Jon [name] is a 60 y.o. right handed male with history of hypertension as well as alcohol use. Patient on no prescription medications. He is married and worked full-time prior to admission. Wife can assist on discharge. Presented 01/08/2016 with acute onset of headache and left-sided weakness. Initial blood pressure was reportedly elevated. CT of the head showed right basal ganglia hemorrhage with 31 mL volume. 3 mm midline shift. CT angiogram head and neck with no intracranial or extracranial stenosis dissection or aneurysm. Echocardiogram with ejection fraction of 65% no wall motion abnormalities. Patient did not receive TPA secondary to ICH. Subcutaneous heparin for DVT prophylaxis added 01/10/2016.  Physical therapy evaluation completed 01/10/2016 with recommendations of physical medicine rehabilitation consult.The patient's wife notes the patient is not motivated by nature and if he could produce "lying in bed all day.Patient was admitted for a comprehensive rehabilitation program   NIH Total: 15    Past Medical History  Past Medical History  Diagnosis Date  . Hypertension   . Liver damage     Family History  family history is not on file.  Prior Rehab/Hospitalizations:  Has the patient had major surgery during 100 days prior to admission? No  Current Medications   Current facility-administered medications:  .  0.9 %  sodium chloride infusion, , Intravenous, Continuous, Marvel PlanJindong Xu, MD, Last Rate: 50 mL/hr at 01/13/16 0149 .  acetaminophen (TYLENOL) tablet 650 mg, 650 mg, Oral, Q4H PRN, 650 mg at 01/14/16 0156 **OR** [DISCONTINUED] acetaminophen (TYLENOL) suppository 650 mg, 650 mg, Rectal, Q4H PRN, Caryl PinaEric Lindzen, MD, 650 mg at 01/09/16 0232 .  acetaminophen-codeine (TYLENOL #3) 300-30 MG per tablet 1-2 tablet, 1-2 tablet, Oral, Q4H PRN, Caryl PinaEric Lindzen, MD, 2 tablet at 01/14/16 0807 .  amLODipine (NORVASC) tablet 10 mg, 10 mg, Oral, Daily, Marvel PlanJindong Xu, MD, 10 mg at 01/13/16 1045 .  feeding supplement (ENSURE  ENLIVE) (ENSURE ENLIVE) liquid 237 mL, 237 mL, Oral, BID BM, Marvel Plan, MD, 237 mL at 01/13/16 1503 .  folic acid (FOLVITE) tablet 1 mg, 1 mg, Oral, Daily, Marvel Plan, MD, 1 mg at 01/13/16 1045 .  heparin injection 5,000 Units, 5,000 Units, Subcutaneous, Q8H, Marvel Plan, MD, 5,000 Units at 01/14/16 0545 .  labetalol (NORMODYNE,TRANDATE) injection 10-40 mg, 10-40 mg, Intravenous, Q2H PRN, Marvel Plan, MD, 20 mg at 01/12/16 2229 .  metoprolol tartrate (LOPRESSOR) tablet 75 mg, 75 mg, Oral, BID, Marvel Plan, MD, 75 mg at 01/13/16 2242 .  multivitamin with minerals tablet 1 tablet, 1 tablet, Oral, Daily, Marvel Plan, MD, 1 tablet at 01/13/16 1045 .  ondansetron (ZOFRAN) injection 4 mg,  4 mg, Intravenous, Q8H PRN, David L Rinehuls, PA-C .  pantoprazole (PROTONIX) EC tablet 40 mg, 40 mg, Oral, Daily, Marvel Plan, MD, 40 mg at 01/13/16 1045 .  pravastatin (PRAVACHOL) tablet 40 mg, 40 mg, Oral, q1800, Marvel Plan, MD, 40 mg at 01/13/16 1717 .  RESOURCE THICKENUP CLEAR, , Oral, PRN, Marvel Plan, MD .  senna-docusate (Senokot-S) tablet 1 tablet, 1 tablet, Oral, BID, Caryl Pina, MD, 1 tablet at 01/13/16 2242 .  sertraline (ZOLOFT) tablet 50 mg, 50 mg, Oral, Daily, Marvel Plan, MD, 50 mg at 01/14/16 1128 .  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  Home Assistive Devices / Equipment Home Assistive Devices/Equipment: None Home Equipment: None  Prior Device Use: Indicate devices/aids used by the patient prior to current illness, exacerbation or injury? None of the above  Prior Functional Level Prior Function Level of Independence: Independent  Self Care: Did the patient need help bathing, dressing, using the toilet or eating?  Independent  Indoor Mobility: Did the patient need assistance with walking from room to room (with or without device)? Independent  Stairs: Did the patient need assistance with internal or external stairs (with or without device)? Independent  Functional Cognition: Did the patient need help planning regular tasks such as shopping or remembering to take  medications? Independent  Current Functional Level Cognition  Arousal/Alertness: Lethargic Overall Cognitive Status: Impaired/Different from baseline Current Attention Level: Sustained Orientation Level: Oriented X4 Following Commands: Follows one step commands with increased time (multimodal cues) Safety/Judgement: Decreased awareness of safety, Decreased awareness of deficits General Comments: Continues to have left inattention, able to gaze right with cues but does not sustain. Attention: Sustained Sustained Attention: Impaired Sustained Attention Impairment: Verbal basic, Functional basic Awareness: Impaired Awareness Impairment: Emergent impairment, Anticipatory impairment Problem Solving: Impaired Problem Solving Impairment: Functional basic Executive Function: Self Monitoring, Organizing Organizing: Impaired Organizing Impairment: Verbal basic Self Monitoring: Impaired Self Monitoring Impairment: Functional basic Behaviors: Impulsive, Perseveration Safety/Judgment: Impaired Comments: pt perseverative on wanting to go to sleep    Extremity Assessment (includes Sensation/Coordination)  Upper Extremity Assessment: LUE deficits/detail LUE Deficits / Details: flaccid LUE. Reports no sensation. LUE Sensation: decreased light touch, decreased proprioception LUE Coordination: decreased fine motor,  decreased gross motor  Lower Extremity Assessment: Defer to PT evaluation LLE Deficits / Details: extensor tone noted LLE, no active movement, does withdrawl to pain LLE Sensation: decreased light touch    ADLs  Overall ADL's : Needs assistance/impaired Eating/Feeding: Moderate assistance, Sitting Grooming: Wash/dry hands, Wash/dry face, Oral care, Minimal assistance, Sitting Grooming Details (indicate cue type and reason): to wipe mouth with increased secretions Upper Body Bathing: Moderate assistance, Sitting, Cueing for sequencing Lower Body Bathing: Maximal assistance, +2 for  physical assistance, Sit to/from stand, Cueing for sequencing Upper Body Dressing : Moderate assistance, Sitting Upper Body Dressing Details (indicate cue type and reason): Pt able to raise R arm and place into gown with max verbal cues. Lower Body Dressing: Maximal assistance, +2 for physical assistance, Sit to/from stand General ADL Comments: Facilitating trunk control during funcitonal reaching tasks. L bias. Poor awraeness of midline    Mobility  Overal bed mobility: Needs Assistance Bed Mobility: Supine to Sit Supine to sit: Max assist, HOB elevated Sit to supine: Max assist, +2 for physical assistance General bed mobility comments: Therapist stabilizing Rt heel and pt able to push through RLE and scoot bottom towards left to get to EOB. Assist with LLE and trunk to come to sitting.    Transfers  Overall transfer level: Needs assistance Equipment used: 2 person hand held assist Transfers: Squat Pivot Transfers Sit to Stand: Max assist, +2 physical assistance Squat pivot transfers: Max assist, +2 physical assistance General transfer comment: + 2 Max A squat pivot transfer to chair with manual/verbal cues for hand placement. Assist managing LLE during transfer.     Ambulation / Gait / Stairs / Engineer, drilling / Balance Dynamic Sitting Balance Sitting balance - Comments: Variable assist needed- Min A-Max A sitting EOB with left lateral lean, no pushing noted today. manual cues for facilitation of thoracic and lumbar extension and upright posture. Able to self correct with multimodal cues but not able to maintain. Sat EOB performing ADLs and attending to left side ~15 mins Balance Overall balance assessment: Needs assistance Sitting-balance support: Feet supported, Single extremity supported Sitting balance-Leahy Scale: Zero Sitting balance - Comments: Variable assist needed- Min A-Max A sitting EOB with left lateral lean, no pushing noted today. manual cues for  facilitation of thoracic and lumbar extension and upright posture. Able to self correct with multimodal cues but not able to maintain. Sat EOB performing ADLs and attending to left side ~15 mins Postural control: Left lateral lean Standing balance support: During functional activity Standing balance-Leahy Scale: Zero Standing balance comment: Requires assist of 2 for standing balance- therapist stabilizing LLE and assisting with trunk as pt with left lateral lean. Able to stand for ~1-2 mins, ~1 min    Special needs/care consideration BiPAP/CPAP: No CPM: No Continuous Drip IV: No Dialysis: No         Life Vest: No Oxygen: No Special Bed: No Trach Size: No Wound Vac (area): No       Skin: WDL                        Bowel mgmt: 01/07/16 Bladder mgmt: Condom catheter  Diabetic mgmt: HgbA1C 5.5      Previous Home Environment Living Arrangements: Spouse/significant other  Lives With: Spouse Available Help at Discharge: Family Type of Home: House Home Layout: One level Home Access: Stairs to enter Secretary/administrator of Steps: 2 Bathroom Shower/Tub: Engineer, manufacturing systems:  Standard Home Care Services: No  Discharge Living Setting Plans for Discharge Living Setting: Patient's home, Lives with (comment) (Spouse) Type of Home at Discharge: House Discharge Home Layout: One level Discharge Home Access: Stairs to enter Entrance Stairs-Rails: Can reach both Entrance Stairs-Number of Steps: 3 Discharge Bathroom Shower/Tub: Tub/shower unit, Curtain Discharge Bathroom Toilet: Standard Discharge Bathroom Accessibility: Yes How Accessible: Accessible via walker Does the patient have any problems obtaining your medications?: No  Social/Family/Support Systems Patient Roles: Spouse, Parent Anticipated Caregiver: Spouse, [name] cell:[phone] Anticipated Industrial/product designerCareAggie Cosiergiver's Contact Information: home:[phone] Ability/Limitations of Caregiver: Patient's wife reports she can do  light-mod assist with training  Caregiver Availability: 24/7 Discharge Plan Discussed with Primary Caregiver: Yes Is Caregiver In Agreement with Plan?: Yes Does Caregiver/Family have Issues with Lodging/Transportation while Pt is in Rehab?: No  Goals/Additional Needs Patient/Family Goal for Rehab: PT/OT Min-Mod assist; SLP Supervision-Min assist  Expected length of stay: 20-24 days Cultural Considerations: None Dietary Needs: Allergy to tree nuts  Equipment Needs: TBD Special Service Needs: None Additional Information: Will a ramp be needed for discharge home? Pt/Family Agrees to Admission and willing to participate: Yes Program Orientation Provided & Reviewed with Pt/Caregiver Including Roles  & Responsibilities: Yes Additional Information Needs: Will a ramp be needed for home discharge? Information Needs to be Provided By: Therapy input and recommendations if needed  Decrease burden of Care through IP rehab admission: No  Possible need for SNF placement upon discharge: Not anticipated   Patient Condition: This patient's medical and functional and support status has changed since the consult dated 01/11/16 in which the Rehabilitation Physician determined and documented that the patient was potentially appropriate for intensive rehabilitative care in an inpatient rehabilitation facility. Issues have been addressed and update has been discussed with Dr. Wynn BankerKirsteins  and patient now appropriate for inpatient rehabilitation given adequate support upon discharge. Will admit to inpatient rehab today.   Preadmission Screen Completed By:  Fae PippinMelissa Shanaia Sievers, 01/14/2016 11:31 AM ______________________________________________________________________   Discussed status with Dr. Wynn BankerKirsteins on 01/14/16 at 1150 and received telephone approval for admission today.  Admission Coordinator:  Fae PippinMelissa Aarionna Germer, time 1150/Date 01/14/16

## 2016-01-15 ENCOUNTER — Inpatient Hospital Stay (HOSPITAL_COMMUNITY): Payer: PRIVATE HEALTH INSURANCE | Admitting: Speech Pathology

## 2016-01-15 ENCOUNTER — Inpatient Hospital Stay (HOSPITAL_COMMUNITY): Payer: PRIVATE HEALTH INSURANCE | Admitting: Physical Therapy

## 2016-01-15 ENCOUNTER — Inpatient Hospital Stay (HOSPITAL_COMMUNITY): Payer: PRIVATE HEALTH INSURANCE | Admitting: Occupational Therapy

## 2016-01-15 DIAGNOSIS — I610 Nontraumatic intracerebral hemorrhage in hemisphere, subcortical: Secondary | ICD-10-CM

## 2016-01-15 DIAGNOSIS — G8194 Hemiplegia, unspecified affecting left nondominant side: Secondary | ICD-10-CM

## 2016-01-15 DIAGNOSIS — I619 Nontraumatic intracerebral hemorrhage, unspecified: Secondary | ICD-10-CM

## 2016-01-15 DIAGNOSIS — N390 Urinary tract infection, site not specified: Secondary | ICD-10-CM

## 2016-01-15 DIAGNOSIS — I69391 Dysphagia following cerebral infarction: Secondary | ICD-10-CM

## 2016-01-15 LAB — COMPREHENSIVE METABOLIC PANEL
ALBUMIN: 3.4 g/dL — AB (ref 3.5–5.0)
ALT: 61 U/L (ref 17–63)
ANION GAP: 11 (ref 5–15)
AST: 72 U/L — ABNORMAL HIGH (ref 15–41)
Alkaline Phosphatase: 118 U/L (ref 38–126)
BUN: 12 mg/dL (ref 6–20)
CHLORIDE: 102 mmol/L (ref 101–111)
CO2: 24 mmol/L (ref 22–32)
Calcium: 9.5 mg/dL (ref 8.9–10.3)
Creatinine, Ser: 0.66 mg/dL (ref 0.61–1.24)
GFR calc non Af Amer: >60 mL/min (ref >60)
GLUCOSE: 108 mg/dL — AB (ref 65–99)
Potassium: 3.8 mmol/L (ref 3.5–5.1)
SODIUM: 137 mmol/L (ref 135–145)
Total Bilirubin: 0.9 mg/dL (ref 0.3–1.2)
Total Protein: 7.5 g/dL (ref 6.5–8.1)

## 2016-01-15 LAB — CBC WITH DIFFERENTIAL/PLATELET
BASOS PCT: 0 %
Basophils Absolute: 0 10*3/uL (ref 0.0–0.1)
EOS ABS: 0.1 10*3/uL (ref 0.0–0.7)
EOS PCT: 1 %
HCT: 42.2 % (ref 39.0–52.0)
Hemoglobin: 14.4 g/dL (ref 13.0–17.0)
LYMPHS ABS: 1.2 10*3/uL (ref 0.7–4.0)
Lymphocytes Relative: 12 %
MCH: 34 pg (ref 26.0–34.0)
MCHC: 34.1 g/dL (ref 30.0–36.0)
MCV: 99.5 fL (ref 78.0–100.0)
Monocytes Absolute: 1.4 10*3/uL — ABNORMAL HIGH (ref 0.1–1.0)
Monocytes Relative: 15 %
NEUTROS PCT: 72 %
Neutro Abs: 7 10*3/uL (ref 1.7–7.7)
PLATELETS: 163 10*3/uL (ref 150–400)
RBC: 4.24 MIL/uL (ref 4.22–5.81)
RDW: 12.2 % (ref 11.5–15.5)
WBC: 9.8 10*3/uL (ref 4.0–10.5)

## 2016-01-15 MED ORDER — TIZANIDINE HCL 2 MG PO TABS
2.0000 mg | ORAL_TABLET | Freq: Three times a day (TID) | ORAL | Status: DC
  Administered 2016-01-15 – 2016-01-29 (×43): 2 mg via ORAL
  Filled 2016-01-15 (×44): qty 1

## 2016-01-15 NOTE — Evaluation (Signed)
Speech Language Pathology Assessment and Plan  Patient Details  Name: Jon [name] MRN: [id number] Date of Birth: 01-01-1956  SLP Diagnosis: Dysarthria;Cognitive Impairments;Dysphagia  Rehab Potential: Good ELOS: 24-27 days     Today's Date: 01/15/2016 SLP Individual Time: 6803-2122 SLP Individual Time Calculation (min): 60 min   Problem List:  Patient Active Problem List   Diagnosis Date Noted  . Hyperlipidemia 01/14/2016  . Depression 01/14/2016  . Obesity 01/14/2016  . Hyperglycemia 01/14/2016  . Urinary tract infection, site not specified 01/14/2016  . Basal ganglia hemorrhage ([place]) 01/14/2016  . Hemiparesis affecting nondominant side as late effect of cerebrovascular accident ([place])   . Cognitive deficit, post-stroke   . Left hemiparesis ([place])   . Essential hypertension   . Dysphagia, post-stroke   . Gait disturbance, post-stroke   . Hyponatremia   . Thrombocytopenia ([place])   . Hemorrhagic stroke ([place]) R basal ganglia d/t HTN 01/08/2016   Past Medical History:  Past Medical History  Diagnosis Date  . Hypertension   . Liver damage    Past Surgical History: No past surgical history on file.  Assessment / Plan / Recommendation Clinical Impression   Jon [name] is a 60 y.o. right handed male who presented 01/08/2016 with acute onset of headache and left-sided weakness. CT of the head showed right basal ganglia hemorrhage with 31 mL volume. 3 mm midline shift. Dysphagia #1 nectar thick liquid diet.Patient was admitted for a comprehensive rehabilitation program on 01/14/2016.  SLP evaluation completed on 01/15/2016 with the following results:  Pt presents with a mild oral dysphagia characterized by discoordinated and prolonged mastication of solids due to left sided oral motor weakness and decreased attention to boluses.  Pt able to clear solids from the oral cavity post swallow with minimal residuals.  No overt s/s of aspiration evident with solids or liquids.  Recommend  that pt remain on 3 textures and thin liquids with full supervision for use of swallowing precautions due to cognition.   Pt's speech is characterized by a mild-moderate dysarthria due to the abovementioned oral motor deficits which, in combination with rapid rushes of speech and decreased vocal intensity, impact articulatory precision and intelligibility at the conversational level. Pt also presents with moderately-severe cognitive impairment characterized by poor sustained attention to tasks, limited intellectual and emergent awareness of deficits, left inattention, impulsivity, and poor task initiation.   Pt has experienced a significant loss of function due to CVA and would benefit from skilled ST while inpatient in order to maximize functional independence and reduce burden of care prior to discharge. Anticipate that pt will need close 24/7 supervision at discharge in addition to [place] follow up at next level of care.      Skilled Therapeutic Interventions          Cognitive-linguistic evaluation completed with results and recommendations reviewed with family.     SLP Assessment  Patient will need skilled Speech Lanaguage Pathology Services during CIR admission    Recommendations  SLP Diet Recommendations: Dysphagia 3 (Mech soft);Thin Liquid Administration via: Cup;Straw Medication Administration: Crushed with puree Supervision: Patient able to self feed;Full supervision/cueing for compensatory strategies Compensations: Minimize environmental distractions;Small sips/bites;Slow rate;Lingual sweep for clearance of pocketing Postural Changes and/or Swallow Maneuvers: Seated upright 90 degrees;Upright 30-60 min after meal Oral Care Recommendations: Oral care BID Patient destination: Home Follow up Recommendations: 24 hour supervision/assistance;Home Health SLP;Outpatient SLP Equipment Recommended: None recommended by SLP    SLP Frequency 3 to 5 out of 7 days  SLP Duration  SLP Intensity  SLP  Treatment/Interventions 24-27 days   Minumum of 1-2 x/day, 30 to 90 minutes  Cognitive remediation/compensation;Cueing hierarchy;Dysphagia/aspiration precaution training;Environmental controls;Internal/external aids;Multimodal communication approach;Patient/family education    Pain Pain Assessment Pain Assessment: Faces Faces Pain Scale: Hurts little more Pain Location: Bladder Pain Descriptors / Indicators: Discomfort;Pressure Pain Intervention(s): RN made aware;Repositioned  Prior Functioning Cognitive/Linguistic Baseline: Within functional limits Type of Home: House  Lives With: Spouse Available Help at Discharge: Family Vocation: Full time employment  Function:  Eating Eating   Modified Consistency Diet: Yes Eating Assist Level: Supervision or verbal cues;More than reasonable amount of time;Set up assist for   Eating Set Up Assist For: Opening containers       Cognition Comprehension Comprehension assist level: Understands basic 90% of the time/cues < 10% of the time  Expression   Expression assist level: Expresses basic 50 - 74% of the time/requires cueing 25 - 49% of the time. Needs to repeat parts of sentences.  Social Interaction Social Interaction assist level: Interacts appropriately 50 - 74% of the time - May be physically or verbally inappropriate.  Problem Solving Problem solving assist level: Solves basic 25 - 49% of the time - needs direction more than half the time to initiate, plan or complete simple activities  Memory Memory assist level: Recognizes or recalls 25 - 49% of the time/requires cueing 50 - 75% of the time   Short Term Goals: Week 1: SLP Short Term Goal 1 (Week 1): Pt will sustain his attention to basic tasks for 1-2 minute intervals with mod assist verbal cues for redirection.  SLP Short Term Goal 2 (Week 1): Pt will verbalize at least 2 deficits s/p CVA with mod assist question cues.   SLP Short Term Goal 3 (Week 1): Pt will be intelligible at  the conversational level with min assist verbal cues for overarticulation, increased vocal intensity and slow rate of speech.   SLP Short Term Goal 4 (Week 1): Pt will consume dys 3 textures and thin liquids with min assist verbal cues for use of swallowing precautions.   SLP Short Term Goal 5 (Week 1): Pt will visually scan to the left of midline during basic, famliar tasks with max assist multimodal cues in 50% of opportunities.    Refer to Care Plan for Long Term Goals  Recommendations for other services: None  Discharge Criteria: Patient will be discharged from SLP if patient refuses treatment 3 consecutive times without medical reason, if treatment goals not met, if there is a change in medical status, if patient makes no progress towards goals or if patient is discharged from hospital.  The above assessment, treatment plan, treatment alternatives and goals were discussed and mutually agreed upon: by patient  Emilio Math 01/15/2016, 4:15 PM

## 2016-01-15 NOTE — Evaluation (Signed)
Occupational Therapy Assessment and Plan  Patient Details  Name: Jon [name] MRN: [id number] Date of Birth: 05-04-1956  OT Diagnosis: abnormal posture, cognitive deficits, disturbance of vision, flaccid hemiplegia and hemiparesis, hemiplegia affecting non-dominant side, lumbago (low back pain) and muscle weakness (generalized) Rehab Potential: Rehab Potential (ACUTE ONLY): Good ELOS: 21-24 days   Today's Date: 01/15/2016 OT Individual Time: 1000-1100 OT Individual Time Calculation (min): 60 min     Problem List:  Patient Active Problem List   Diagnosis Date Noted  . Hyperlipidemia 01/14/2016  . Depression 01/14/2016  . Obesity 01/14/2016  . Hyperglycemia 01/14/2016  . Urinary tract infection, site not specified 01/14/2016  . Basal ganglia hemorrhage ([place]) 01/14/2016  . Hemiparesis affecting nondominant side as late effect of cerebrovascular accident ([place])   . Cognitive deficit, post-stroke   . Left hemiparesis ([place])   . Essential hypertension   . Dysphagia, post-stroke   . Gait disturbance, post-stroke   . Hyponatremia   . Thrombocytopenia ([place])   . Hemorrhagic stroke ([place]) R basal ganglia d/t HTN 01/08/2016    Past Medical History:  Past Medical History  Diagnosis Date  . Hypertension   . Liver damage    Past Surgical History: No past surgical history on file.  Assessment & Plan Clinical Impression: Patient is a 60 y.o. left handed male with history of hypertension As well as alcohol use. Patient on no prescription medications. He is married and worked full-time prior to admission. Wife can assist on discharge. Presented 01/08/2016 with acute onset of headache and left-sided weakness. Initial blood pressure was reportedly elevated. CT of the head showed right basal ganglia hemorrhage with 31 mL volume. 3 mm midline shift. CT angiogram head and neck with no intracranial or extracranial stenosis dissection or aneurysm. Echocardiogram with ejection fraction of 65% no wall  motion abnormalities. Patient did not receive TPA secondary to [place]. Subcutaneous heparin for DVT prophylaxis added 01/10/2016 Dysphagia #1 nectar thick liquid diet. Physical therapy evaluation completed 01/10/2016 with recommendations of physical medicine rehabilitation consult. The patient's wife notes the patient is not motivated by nature and if he could produce "lying in bed all day".   Patient transferred to CIR on 01/14/2016 .    Patient currently requires total with basic self-care skills secondary to muscle weakness, impaired timing and sequencing, unbalanced muscle activation, decreased coordination and decreased motor planning, decreased visual perceptual skills, decreased midline orientation and decreased attention to left, decreased initiation, decreased attention, decreased awareness, decreased problem solving, decreased safety awareness and decreased memory and decreased sitting balance, decreased standing balance, decreased postural control, hemiplegia, decreased balance strategies and difficulty maintaining precautions.  Prior to hospitalization, patient could complete ADLs with independent .  Patient will benefit from skilled intervention to decrease level of assist with basic self-care skills prior to discharge home with care partner.  Anticipate patient will require 24 hour supervision and minimal physical assistance and follow up home health vs SNF.  OT - End of Session Activity Tolerance: Tolerates 30+ min activity with multiple rests Endurance Deficit: Yes Endurance Deficit Description: pt very fatigued/lethargic, required frequent rest breaks and redirection OT Assessment Rehab Potential (ACUTE ONLY): Good Barriers to Discharge: Decreased caregiver support Barriers to Discharge Comments: Unsure if wife will be able to provide physical assist OT Patient demonstrates impairments in the following area(s): Balance;Cognition;Endurance;Motor;Pain;Perception;Safety;Sensory;Skin  Integrity;Vision OT Basic ADL's Functional Problem(s): Eating;Grooming;Bathing;Dressing;Toileting OT Transfers Functional Problem(s): Toilet;Tub/Shower OT Additional Impairment(s): Fuctional Use of Upper Extremity OT Plan OT Intensity: Minimum of 1-2 x/day, 45 to  90 minutes OT Frequency: 5 out of 7 days OT Duration/Estimated Length of Stay: 21-24 days OT Treatment/Interventions: Balance/vestibular training;Cognitive remediation/compensation;Discharge planning;Disease mangement/prevention;DME/adaptive equipment instruction;Functional electrical stimulation;Functional mobility training;Neuromuscular re-education;Pain management;Patient/family education;Psychosocial support;Self Care/advanced ADL retraining;Splinting/orthotics;Therapeutic Activities;Therapeutic Exercise;UE/LE Strength taining/ROM;UE/LE Coordination activities;Visual/perceptual remediation/compensation;Wheelchair propulsion/positioning OT Self Feeding Anticipated Outcome(s): Supervision/setup OT Basic Self-Care Anticipated Outcome(s): Min assist OT Toileting Anticipated Outcome(s): Min assist OT Bathroom Transfers Anticipated Outcome(s): Min assist OT Recommendation Patient destination: Home (vs SNF) Follow Up Recommendations: Home health OT;24 hour supervision/assistance (vs SNF) Equipment Recommended: 3 in 1 bedside comode;Tub/shower bench   Skilled Therapeutic Intervention OT eval completed with discussion of rehab process, OT purpose, POC, ELOS, and goals.  ADL assessment completed with focus on attention to task, scanning to Lt visual field, sit > stand for hygiene and LB dressing, and toilet transfers.  Pt not overly motivated but reporting need to toilet.  Max assist squat pivot transfer to toilet. Noted pt to demonstrate impulsivity with movements, while also demonstrating decreased initiation and ability to follow directions.  +2 for hygiene at sit > stand level and squat pivot to return to w/c.  Difficult to fully assess  vision and cognition due to confabulation and decreased sustained attention to task, will continue to assess.    OT Evaluation Precautions/Restrictions  Precautions Precautions: Fall Pain Pain Assessment Pain Assessment: 0-10 Pain Score: 5  Pain Location: Back Pain Orientation: Lower Pain Descriptors / Indicators: Aching;Tender Pain Intervention(s): Repositioned Home Living/Prior Functioning Home Living Family/patient expects to be discharged to:: Private residence Living Arrangements: Spouse/significant other Available Help at Discharge: Family Type of Home: House Home Access: Stairs to enter Technical brewer of Steps: 2 Home Layout: One level Bathroom Shower/Tub: Chiropodist: Standard  Lives With: Spouse IADL History Homemaking Responsibilities: Yes Meal Prep Responsibility: Building surveyor Responsibility: No Cleaning Responsibility: No Mode of Transportation: Car Occupation: Full time employment Type of Occupation: worked at [employer] Prior Function Level of Independence: Independent with basic ADLs, Independent with gait  Able to Take Stairs?: Yes Driving: Yes Vocation: Full time employment Leisure: Hobbies-yes (Comment) (pt has cats) ADL  See Function Navigator Vision/Perception  Vision- History Baseline Vision/History: Wears glasses Wears Glasses: Reading only Patient Visual Report: Blurring of vision Vision- Assessment Vision Assessment?: Vision impaired- to be further tested in functional context Additional Comments: Difficult to assess secondary to impaired sustained attention. Pt demonstrates Rt head turn and gaze preference with Lt inattention.  Requires increased cues to scan to Lt to locate items at midline.  Cognition Overall Cognitive Status: Impaired/Different from baseline Arousal/Alertness: Lethargic Orientation Level: Person;Situation Year: 2017 Month: June Day of Week: Incorrect (Tuesday) Immediate  Memory Recall: Sock;Blue;Bed Memory Recall: Sock;Blue;Bed Memory Recall Sock: Without Cue Memory Recall Blue: Without Cue Memory Recall Bed: Without Cue Attention: Sustained Sustained Attention: Impaired Sustained Attention Impairment: Verbal basic;Functional basic Awareness: Impaired Awareness Impairment: Emergent impairment;Anticipatory impairment Problem Solving: Impaired Problem Solving Impairment: Functional basic Executive Function: Self Monitoring;Organizing;Sequencing;Initiating Sequencing: Impaired Organizing: Impaired Initiating: Impaired Self Monitoring: Impaired Behaviors: Impulsive;Perseveration Safety/Judgment: Impaired Comments: pt perseverative on wanting to go to sleep Sensation Sensation Light Touch: Impaired Detail Light Touch Impaired Details: Absent LUE Stereognosis: Impaired Detail Stereognosis Impaired Details: Absent LUE Hot/Cold: Not tested Proprioception: Impaired Detail Proprioception Impaired Details: Absent LUE Coordination Gross Motor Movements are Fluid and Coordinated: No Fine Motor Movements are Fluid and Coordinated: No Finger Nose Finger Test: Unable to complete on Lt due to flaccid UE, and decreased attention to task to attempt with Rt 9 Hole Peg Test:  Unable to complete on Lt due to flaccid UE, and decreased attention ot task to attempt with Rt Extremity/Trunk Assessment RUE Assessment RUE Assessment: Within Functional Limits LUE Assessment LUE Assessment: Exceptions to [facility] (flaccid LUE, PROM WFL)   See Function Navigator for Current Functional Status.   Refer to Care Plan for Long Term Goals  Recommendations for other services: None  Discharge Criteria: Patient will be discharged from OT if patient refuses treatment 3 consecutive times without medical reason, if treatment goals not met, if there is a change in medical status, if patient makes no progress towards goals or if patient is discharged from hospital.  The above  assessment, treatment plan, treatment alternatives and goals were discussed and mutually agreed upon: by patient  Simonne Come 01/15/2016, 1:46 PM

## 2016-01-15 NOTE — Progress Notes (Signed)
Social Work  Social Work Assessment and Plan  Patient Details  Name: Jon [name] MRN: [id number] Date of Birth: [birth year]  Today's Date: 01/15/2016  Problem List:  Patient Active Problem List   Diagnosis Date Noted  . Hyperlipidemia 01/14/2016  . Depression 01/14/2016  . Obesity 01/14/2016  . Hyperglycemia 01/14/2016  . Urinary tract infection, site not specified 01/14/2016  . Basal ganglia hemorrhage ([facility]) 01/14/2016  . Hemiparesis affecting nondominant side as late effect of cerebrovascular accident ([facility])   . Cognitive deficit, post-stroke   . Left hemiparesis ([facility])   . Essential hypertension   . Dysphagia, post-stroke   . Gait disturbance, post-stroke   . Hyponatremia   . Thrombocytopenia ([facility])   . Hemorrhagic stroke ([facility]) R basal ganglia d/t HTN 01/08/2016   Past Medical History:  Past Medical History  Diagnosis Date  . Hypertension   . Liver damage    Past Surgical History: No past surgical history on file. Social History:  reports that he has never smoked. He does not have any smokeless tobacco history on file. He reports that he drinks about 7.2 oz of alcohol per week. He reports that he does not use illicit drugs.  Family / Support Systems Marital Status: Married Patient Roles: Spouse, Parent, Other (Comment) (Employee) Spouse/Significant Other: [name] [phone]  [phone] Children: Daughter next door-[name] Other Supports: Siblings and wife's parents Anticipated Caregiver: [name] and other family members Ability/Limitations of Caregiver: [name] has RA and can do some physical assist but not much, will have other family members to assist Caregiver Availability: 24/7 Family Dynamics: Close knit family, daughter has anxiety issues and does not like coming to the hospital.  They have extended family who will assist at discharge. Pt wants to be independent and able to take care of himself instead of asking for assistance.  Social History Preferred  language: English Religion: Non-Denominational Cultural Background: No issues Education: High School Read: Yes Write: Yes Employment Status: Employed Name of Employer: [employer]-fulltime Return to Work Plans: Plans to return he is the sole breadwinner in the family Legal Hisotry/Current Legal Issues: No issues Guardian/Conservator: none-according to MD pt is capable of making his own decisions while here. Will make sure wife is aware of any decisions if needed while here.   Abuse/Neglect Physical Abuse: Denies Verbal Abuse: Denies Sexual Abuse: Denies Exploitation of patient/patient's resources: Denies Self-Neglect: Denies  Emotional Status Pt's affect, behavior adn adjustment status: Pt has always been independent and able to take care of himself, he has never been in a hospital before. He wants to be here a short time and then return to work. His wife is willing to do what she can but needs to be careful with her RA.  Recent Psychosocial Issues: Health issues but was taking no medications for his HTN Pyschiatric History: No history deferred depression at this time due to exhausted form am therapies. He was laying down resting. This worker does feel he would benefit from neuro-psych eval while here for his coping and substance abuse issues. Will make referral. Substance Abuse History: ETOH aware needs to quit and the health issues it may cause. Wife feels he does not drink a lot, but may not know. Will address while here.  Patient / Family Perceptions, Expectations & Goals Pt/Family understanding of illness & functional limitations: Pt and wife can explain his stroke, wife has a better understanding of his deficits and has spoken with the MD about them. Pt is impulsive and a safety risk due to  his stroke. Wife feels her questions and concerns has been addressed. Premorbid pt/family roles/activities: Husband, father, employee, son, brother. etc Anticipated changes in  roles/activities/participation: resume Pt/family expectations/goals: Pt states: " I need to rest I am dying, they put me through the wringer today."  Wife states: " I hope he does well here and makes a lot of progress."  [employer] IncCommunity Resources Community Agencies: None Premorbid Home Care/DME Agencies: None Transportation available at discharge: Wife and family members Resource referrals recommended: Neuropsychology, Support group (specify)  Discharge Planning Living Arrangements: Spouse/significant other Support Systems: Spouse/significant other, Children, Parent, Other relatives, Friends/neighbors Type of Residence: Private residence Insurance Resources: Media plannerrivate Insurance (specify) Counselling psychologist([employer] [place]) Surveyor, quantityinancial Resources: Employment Surveyor, quantityinancial Screen Referred: Yes Living Expenses: Database administratorMotgage Money Management: Patient, Spouse Does the patient have any problems obtaining your medications?: No Home Management: Wife does inside work and pt does the outside work Associate Professoratient/Family Preliminary Plans: Return home with wife and other family members assisting. Wife is hopeful she will be able to provide the assistance he needs, but will be dependent upon how much that is. Started to discuss finances and disability. Pt has not short term disability through his job. Will work with wife on any available resources to assist with thier bills. Social Work Anticipated Follow Up Needs: HH/OP, Support Group  Clinical Impression Pleasant funny gentleman who has no filter when he responds to questions. Wife reports: " That is him."  Wife is supportive but has RA and needs to be careful and take care of herself. She is hoping she will be able to assist him at discharge But has extended family to help her if needed. Wife reports finances will be an issue if he is not able to return to work. Will explore resources with her and have her apply. Will ask MD if disabled and if should begin disability process. Would benefit from  neuro-psych services and will make referral for them to see while here.  Lucy Chrisupree, Kotaro Buer G 01/15/2016, 1:54 PM

## 2016-01-15 NOTE — Care Management Note (Signed)
[facility] Individual Statement of Services  Patient Name:  Jon CoolerGeorge D Talsma  Date:  01/15/2016  Welcome to the [facility].  Our goal is to provide you with an individualized program based on your diagnosis and situation, designed to meet your specific needs.  With this comprehensive rehabilitation program, you will be expected to participate in at least 3 hours of rehabilitation therapies Monday-Friday, with modified therapy programming on the weekends.  Your rehabilitation program will include the following services:  Physical Therapy (PT), Occupational Therapy (OT), Speech Therapy (ST), 24 hour per day rehabilitation nursing, Neuropsychology, Case Management (Social Worker), Rehabilitation Medicine, Nutrition Services and Pharmacy Services  Weekly team conferences will be held on Wednesday to discuss your progress.  Your Social Worker will talk with you frequently to get your input and to update you on team discussions.  Team conferences with you and your family in attendance may also be held.  Expected length of stay: 21-24 days   Overall anticipated outcome: min assist level  Depending on your progress and recovery, your program may change. Your Social Worker will coordinate services and will keep you informed of any changes. Your Social Worker's name and contact numbers are listed  below.  The following services may also be recommended but are not provided by the [facility]:   Driving Evaluations  Home Health Rehabiltiation Services  Outpatient Rehabilitation Services  Vocational Rehabilitation   Arrangements will be made to provide these services after discharge if needed.  Arrangements include referral to agencies that provide these services.  Your insurance has been verified to be:  [place] Your primary doctor is:  Elias ElseRobert Reade  Pertinent information will be shared with your doctor and your insurance  company.  Social Worker:  Dossie DerBecky Andi [place], SW [phone] or [phone]  Information discussed with and copy given to patient by: Lucy Chrisupree, Evyn Kooyman G, 01/15/2016, 9:34 AM

## 2016-01-15 NOTE — Progress Notes (Signed)
Physical Therapy Assessment and Plan  Patient Details  Name: Jon [name] MRN: [id number] Date of Birth: 07/19/1956  PT Diagnosis: Cognitive deficits, Difficulty walking, Hemiplegia non-dominant, Hypertonia, Impaired cognition, Impaired sensation, Low back pain and Muscle weakness Rehab Potential: Good ELOS: 21-24 days   Today's Date: 01/15/2016 PT Individual Time: 2694-8546 and 1130-1200 PT Individual Time Calculation (min): 60 min and 30 min (total 90 min)    Problem List:  Patient Active Problem List   Diagnosis Date Noted  . Hyperlipidemia 01/14/2016  . Depression 01/14/2016  . Obesity 01/14/2016  . Hyperglycemia 01/14/2016  . Urinary tract infection, site not specified 01/14/2016  . Basal ganglia hemorrhage ([place]) 01/14/2016  . Hemiparesis affecting nondominant side as late effect of cerebrovascular accident ([place])   . Cognitive deficit, post-stroke   . Left hemiparesis ([place])   . Essential hypertension   . Dysphagia, post-stroke   . Gait disturbance, post-stroke   . Hyponatremia   . Thrombocytopenia ([place])   . Hemorrhagic stroke ([place]) R basal ganglia d/t HTN 01/08/2016    Past Medical History:  Past Medical History  Diagnosis Date  . Hypertension   . Liver damage    Past Surgical History: No past surgical history on file.  Assessment & Plan Clinical Impression: Jon [name] is a 60 y.o. right handed male with history of hypertension on no prescription medications. Patient is married and worked full-time prior to admission. Wife can assist on discharge. Presented 01/08/2016 with acute onset of headache and left-sided weakness. Initial blood pressure was reportedly elevated. CT of the head showed right basal ganglia hemorrhage with 31 mL volume. 3 mm midline shift. CT angiogram head and neck with no intracranial or extracranial stenosis dissection or aneurysm. Echocardiogram with ejection fraction of 65% no wall motion abnormalities. Patient did not receive TPA secondary  to Jon [name]. Subcutaneous heparin for DVT prophylaxis added 01/10/2016 Dysphagia #1 nectar thick liquid diet. Physical therapy evaluation completed 01/10/2016 with recommendations of physical medicine rehabilitation consult. The patient's wife notes the patient is not motivated by nature and if he could produce "lying in bed all day." Patient transferred to [facility] [name] on 01/14/2016 .   Patient currently requires max with mobility secondary to muscle weakness and muscle paralysis, decreased cardiorespiratoy endurance, abnormal tone, motor apraxia, decreased coordination and decreased motor planning, decreased midline orientation, decreased attention to left, left side neglect and decreased motor planning, decreased initiation, decreased attention, decreased awareness, decreased problem solving, decreased safety awareness, decreased memory and delayed processing and decreased sitting balance, decreased standing balance, decreased postural control, hemiplegia and decreased balance strategies.  Prior to hospitalization, patient was independent  with mobility and lived with Spouse in a House home.  Home access is 2Stairs to enter.  Patient will benefit from skilled PT intervention to maximize safe functional mobility, minimize fall risk and decrease caregiver burden for planned discharge home with 24 hour assist.  Anticipate patient will benefit from follow up [facility] at discharge.  PT - End of Session Activity Tolerance: Tolerates 30+ min activity with multiple rests Endurance Deficit: Yes Endurance Deficit Description: pt very fatigued/lethargic, required frequent rest breaks and redirection PT Assessment Rehab Potential (ACUTE/IP ONLY): Good Barriers to Discharge: Inaccessible home environment;Decreased caregiver support PT Patient demonstrates impairments in the following area(s): Balance;Behavior;Perception;Safety;Sensory;Endurance;Motor;Pain PT Transfers Functional Problem(s): Bed Mobility;Bed to  Chair;Car;Furniture PT Locomotion Functional Problem(s): Ambulation;Wheelchair Mobility;Stairs PT Plan PT Intensity: Minimum of 1-2 x/day ,45 to 90 minutes PT Frequency: 5 out of 7 days PT Duration Estimated Length of Stay:  21-24 days PT Treatment/Interventions: Ambulation/gait training;Balance/vestibular training;Community reintegration;Cognitive remediation/compensation;Discharge planning;Disease management/prevention;DME/adaptive equipment instruction;Functional mobility training;Neuromuscular re-education;Patient/family education;Pain management;Stair training;Therapeutic Activities;Therapeutic Exercise;Splinting/orthotics;UE/LE Strength taining/ROM;UE/LE Coordination activities;Visual/perceptual remediation/compensation;Wheelchair propulsion/positioning PT Transfers Anticipated Outcome(s): minA PT Locomotion Anticipated Outcome(s): modA in controlled environment PT Recommendation Recommendations for Other Services: Speech consult Follow Up Recommendations: Home health PT Patient destination: Home (Home vs SNF depending on progress) Equipment Recommended: To be determined  Skilled Therapeutic Intervention Tx 1: Pt received supine in bed, denies pain and agreeable to treatment. Pt perseverative on not being able to urinate; requests condom catheter. RN alerted who requested pt attempt urinal or use brief. Performed initial PT evaluation with maxA overall as described below. Limited participation throughout session with pt perseverating on getting back in bed, back pain. Educated pt on rehab process, goals, ELOS however pt resistant to education. Remained seated in w/c at completion of session with quick release belt sitting at nurses station.   Tx 2: Pt received seated in w/c at nurses station, denies pain and agreeable to treatment. Sit <>stand x2 trials with stedy with modA. Educated pt on use of stedy for nursing staff to assist with transfers in room. Squat pivot transfer w/c >bed with modA.  Sit >supine with minA for LLE management. Scooting toward [facility] with RUE on bedrail and BLE in hooklying to A with bridging. Remained supine in bed at completion of session, alarm intact and family friend present with all needs in reach. Pt able to accurately demonstrate use of call bell for assistance.   PT Evaluation Precautions/Restrictions Precautions Precautions: Fall Restrictions Weight Bearing Restrictions: No General Chart Reviewed: Yes Response to Previous Treatment: Not applicable Family/Caregiver Present: No  Pain Pain Assessment Pain Assessment: 0-10 Pain Score: 5  Pain Type: Acute pain Pain Location: Back Pain Orientation: Lower Pain Descriptors / Indicators: Aching;Tender Pain Onset: Gradual Patients Stated Pain Goal: 3 Pain Intervention(s): Repositioned Home Living/Prior Functioning Home Living Available Help at Discharge: Family Type of Home: House Home Access: Stairs to enter Technical brewer of Steps: 2 Home Layout: One level Bathroom Shower/Tub: Chiropodist: Standard  Lives With: Spouse Prior Function Level of Independence: Independent with basic ADLs;Independent with gait  Able to Take Stairs?: Yes Driving: Yes Vocation: Full time employment Leisure: Hobbies-yes (Comment) (pt has cats) Vision/Perception  Vision - Assessment Additional Comments: Difficult to assess secondary to impaired sustained attention. Pt demonstrates Rt head turn and gaze preference with Lt inattention.  Requires increased cues to scan to Lt to locate items at midline.  Cognition Orientation Level: Disoriented to time;Oriented to place;Oriented to person;Oriented to situation Sensation Sensation Light Touch: Impaired Detail Light Touch Impaired Details: Absent LUE Stereognosis: Impaired Detail Stereognosis Impaired Details: Absent LUE Hot/Cold: Not tested Proprioception: Impaired Detail Proprioception Impaired Details: Absent LUE Coordination Gross  Motor Movements are Fluid and Coordinated: No Fine Motor Movements are Fluid and Coordinated: No Finger Nose Finger Test: Unable to complete on Lt due to flaccid UE, and decreased attention to task to attempt with Rt 9 Hole Peg Test: Unable to complete on Lt due to flaccid UE, and decreased attention ot task to attempt with Rt Motor  Motor Motor: Hemiplegia Motor - Skilled Clinical Observations: L hemiparesis, impaired sitting balance  Mobility Bed Mobility Bed Mobility: Supine to Sit Supine to Sit: 5: Supervision;With rails Supine to Sit Details: Verbal cues for precautions/safety;Verbal cues for technique;Tactile cues for sequencing;Tactile cues for initiation;Tactile cues for weight shifting Transfers Transfers: Yes Squat Pivot Transfers: 2: Max assist;With armrests Locomotion  Ambulation Ambulation:  No Gait Gait: No Stairs / Additional Locomotion Stairs: No Architect: Yes Wheelchair Assistance: 3: Building surveyor Details: Verbal cues for technique;Verbal cues for precautions/safety;Verbal cues for sequencing Wheelchair Propulsion: Right upper extremity;Right lower extremity Wheelchair Parts Management: Needs assistance Distance: 66'  Trunk/Postural Assessment  Cervical Assessment Cervical Assessment: Within Functional Limits Thoracic Assessment Thoracic Assessment: Within Functional Limits Lumbar Assessment Lumbar Assessment: Within Functional Limits Postural Control Postural Control: Deficits on evaluation (L lateral LOB in sitting)  Balance Balance Balance Assessed: Yes Static Sitting Balance Static Sitting - Balance Support: Right upper extremity supported;Feet supported Static Sitting - Level of Assistance: 2: Max assist Static Sitting - Comment/# of Minutes: maxA initially, improved to min guard after cues and increased time Dynamic Sitting Balance Dynamic Sitting - Balance Support: Right upper extremity  supported;Feet supported Dynamic Sitting - Level of Assistance: 4: Min assist Dynamic Sitting - Balance Activities: Lateral lean/weight shifting Sitting balance - Comments: Min A-Max A sitting EOB with left lateral lean. Manual cues for facilitation of thoracic and lumbar extension and upright posture Static Standing Balance Static Standing - Balance Support: Right upper extremity supported Static Standing - Level of Assistance: 2: Max assist;3: Mod assist Static Standing - Comment/# of Minutes: x30 sec before pt c/o back pain, fatigue Extremity Assessment  RUE Assessment RUE Assessment: Within Functional Limits LUE Assessment LUE Assessment: Exceptions to WFL (flaccid LUE, PROM WFL) RLE Assessment RLE Assessment: Within Functional Limits LLE Assessment LLE Assessment: Exceptions to [facility] (flaccid AROM; note hallux/ankle dorsiflexion with stimulation at bottom of foot)   See Function Navigator for Current Functional Status.   Refer to Care Plan for Long Term Goals  Recommendations for other services: Other: Speech  Discharge Criteria: Patient will be discharged from PT if patient refuses treatment 3 consecutive times without medical reason, if treatment goals not met, if there is a change in medical status, if patient makes no progress towards goals or if patient is discharged from hospital.  The above assessment, treatment plan, treatment alternatives and goals were discussed and mutually agreed upon: by patient  Luberta Mutter 01/15/2016, 12:31 PM

## 2016-01-15 NOTE — Progress Notes (Signed)
60 y.o. right handed male with history of hypertension As well as alcohol use. Patient on no prescription medications. He is married and worked full-time prior to admission. Wife can assist on discharge. Presented 01/08/2016 with acute onset of headache and left-sided weakness. Initial blood pressure was reportedly elevated. CT of the head showed right basal ganglia hemorrhage with 31 mL volume. 3 mm midline shift. CT angiogram head and neck with no intracranial or extracranial stenosis dissection or aneurysm. Echocardiogram with ejection fraction of 65% no wall motion abnormalities. Patient did not receive TPA secondary to [place].  Subjective/Complaints:   Objective: Vital Signs: Blood pressure 108/71, pulse 82, temperature 98.9 F (37.2 C), temperature source Oral, resp. rate 18, SpO2 98 %. No results found. Results for orders placed or performed during the hospital encounter of 01/14/16 (from the past 72 hour(s))  CBC     Status: Abnormal   Collection Time: 01/14/16  4:57 PM  Result Value Ref Range   WBC 12.2 (H) 4.0 - 10.5 K/uL   RBC 4.45 4.22 - 5.81 MIL/uL   Hemoglobin 15.0 13.0 - 17.0 g/dL   HCT 44.2 39.0 - 52.0 %   MCV 99.3 78.0 - 100.0 fL   MCH 33.7 26.0 - 34.0 pg   MCHC 33.9 30.0 - 36.0 g/dL   RDW 12.1 11.5 - 15.5 %   Platelets 154 150 - 400 K/uL  Creatinine, serum     Status: None   Collection Time: 01/14/16  4:57 PM  Result Value Ref Range   Creatinine, Ser 0.65 0.61 - 1.24 mg/dL   GFR calc non Af Amer >60 >60 mL/min   GFR calc Af Amer >60 >60 mL/min    Comment: (NOTE) The eGFR has been calculated using the CKD EPI equation. This calculation has not been validated in all clinical situations. eGFR's persistently <60 mL/min signify possible Chronic Kidney Disease.   CBC WITH DIFFERENTIAL     Status: Abnormal   Collection Time: 01/15/16  5:53 AM  Result Value Ref Range   WBC 9.8 4.0 - 10.5 K/uL   RBC 4.24 4.22 - 5.81 MIL/uL   Hemoglobin 14.4 13.0 - 17.0 g/dL   HCT 42.2  39.0 - 52.0 %   MCV 99.5 78.0 - 100.0 fL   MCH 34.0 26.0 - 34.0 pg   MCHC 34.1 30.0 - 36.0 g/dL   RDW 12.2 11.5 - 15.5 %   Platelets 163 150 - 400 K/uL   Neutrophils Relative % 72 %   Neutro Abs 7.0 1.7 - 7.7 K/uL   Lymphocytes Relative 12 %   Lymphs Abs 1.2 0.7 - 4.0 K/uL   Monocytes Relative 15 %   Monocytes Absolute 1.4 (H) 0.1 - 1.0 K/uL   Eosinophils Relative 1 %   Eosinophils Absolute 0.1 0.0 - 0.7 K/uL   Basophils Relative 0 %   Basophils Absolute 0.0 0.0 - 0.1 K/uL  Comprehensive metabolic panel     Status: Abnormal   Collection Time: 01/15/16  5:53 AM  Result Value Ref Range   Sodium 137 135 - 145 mmol/L   Potassium 3.8 3.5 - 5.1 mmol/L   Chloride 102 101 - 111 mmol/L   CO2 24 22 - 32 mmol/L   Glucose, Bld 108 (H) 65 - 99 mg/dL   BUN 12 6 - 20 mg/dL   Creatinine, Ser 0.66 0.61 - 1.24 mg/dL   Calcium 9.5 8.9 - 10.3 mg/dL   Total Protein 7.5 6.5 - 8.1 g/dL   Albumin 3.4 (L)  3.5 - 5.0 g/dL   AST 72 (H) 15 - 41 U/L   ALT 61 17 - 63 U/L   Alkaline Phosphatase 118 38 - 126 U/L   Total Bilirubin 0.9 0.3 - 1.2 mg/dL   GFR calc non Af Amer >60 >60 mL/min   GFR calc Af Amer >60 >60 mL/min    Comment: (NOTE) The eGFR has been calculated using the CKD EPI equation. This calculation has not been validated in all clinical situations. eGFR's persistently <60 mL/min signify possible Chronic Kidney Disease.    Anion gap 11 5 - 15    HEENT: normal Cardio: RRR Resp: CTA B/L and unlabored GI: BS positive and nontender nondistended Extremity:  Pulses positive and No Edema Skin:   Other left antecubital fossa with erythema at  prior IV site,, mild  macular rash Neuro: Confused, Cranial Nerve Abnormalities left central 7, Abnormal Sensory Decreased sensation left upper and left lower limb, Abnormal Motor 0/5 left upper extremity 0/5 left lower extremity 5/5 right upper tremor E5/5  right lower extremity, Tone:  Increased tone  in the left elbow flexor as well as left knee extensor  Ashworth grade 3 in the lower limb and 2 in the upper limb and Other affect is inappropriate Musc/Skel:  Other mild pain with  elbow and knee range of motion on the left side pain is in the biceps  and  quad muscle respectively. Not in the joint Gen. no acute distress   Assessment/Plan: 1. Functional deficits secondary to right hemiparesis causing left hemiplegia which require 3+ hours per day of interdisciplinary therapy in a comprehensive inpatient rehab setting. Physiatrist is providing close team supervision and 24 hour management of active medical problems listed below. Physiatrist and rehab team continue to assess barriers to discharge/monitor patient progress toward functional and medical goals. FIM:       Function - Toileting Toileting activity did not occur: No continent bowel/bladder event  Function - Air cabin crew transfer activity did not occur: Safety/medical concerns        Function - Comprehension Comprehension: Auditory Comprehension assist level: Understands basic 50 - 74% of the time/ requires cueing 25 - 49% of the time  Function - Expression Expression: Verbal Expression assist level: Expresses basic 25 - 49% of the time/requires cueing 50 - 75% of the time. Uses single words/gestures.  Function - Social Interaction Social Interaction assist level: Interacts appropriately 50 - 74% of the time - May be physically or verbally inappropriate.  Function - Problem Solving Problem solving assist level: Solves basic 50 - 74% of the time/requires cueing 25 - 49% of the time  Function - Memory Memory assist level: Recognizes or recalls 25 - 49% of the time/requires cueing 50 - 75% of the time Patient normally able to recall (first 3 days only): Current season, That he or she is in a hospital (flucuates)  Medical Problem List and Plan: 1.Left hemiplegia   secondary to right basal ganglia hemorrhage secondary to hypertensive crisis initiate CIR PT, OT  SLP 2.  DVT Prophylaxis/Anticoagulation: Subcutaneous heparin initiated 01/10/2016. Monitor for any bleeding episodes 3. Pain Management/headaches: Tylenol 3 as needed, Post stroke headaches start Topamax 25 twice a day May need to titrate upwards- no headache complaints this morning 4. Dysphagia. Dysphagia #1 nectar liquids. Follow-up speech therapy. 5. Neuropsych: This patient is capable of making decisions on his own behalf. 6. Skin/Wound Care: Routine skin checks, Start Keflex for left antecubital fossa  Cellulitis, superficial 7. Fluids/Electrolytes/Nutrition: Routine I&O's  with follow-up chemistries 8. Hypertension. Norvasc 10 mg daily, Lopressor 75 mg twice a day. Monitor with increased mobility 9. History of alcohol use. Provided counseling monitor for any signs of withdrawal 10. Hyperlipidemia. Pravachol 11. Mood. Zoloft 50 mg daily. Provide emotional support 12. Urinary tract infection will start Keflex pending culture results  LOS (Days) 1 A FACE TO FACE EVALUATION WAS PERFORMED  , E 01/15/2016, 8:35 AM

## 2016-01-15 NOTE — Progress Notes (Signed)
Patient complaining of need to urinate but unable to do so.  Scanned bladder for 96 mL.  Patient is being treated for UTI, symptoms likely due to infection.  Dani Gobbleeardon, Reyann Troop J, RN

## 2016-01-16 ENCOUNTER — Inpatient Hospital Stay (HOSPITAL_COMMUNITY): Payer: PRIVATE HEALTH INSURANCE | Admitting: Physical Therapy

## 2016-01-16 ENCOUNTER — Inpatient Hospital Stay (HOSPITAL_COMMUNITY): Payer: PRIVATE HEALTH INSURANCE | Admitting: Occupational Therapy

## 2016-01-16 ENCOUNTER — Inpatient Hospital Stay (HOSPITAL_COMMUNITY): Payer: PRIVATE HEALTH INSURANCE | Admitting: Speech Pathology

## 2016-01-16 MED ORDER — DIPHENHYDRAMINE HCL 25 MG PO CAPS
25.0000 mg | ORAL_CAPSULE | Freq: Every evening | ORAL | Status: DC | PRN
  Administered 2016-01-16: 25 mg via ORAL
  Filled 2016-01-16 (×2): qty 1

## 2016-01-16 NOTE — Progress Notes (Signed)
Occupational Therapy Session Note  Patient Details  Name: Jon [name] MRN: [id number] Date of Birth: 04-Jun-1956  Today's Date: 01/16/2016 OT Individual Time: [id number] OT Individual Time Calculation (min): 58 min    Short Term Goals: Week 1:  OT Short Term Goal 1 (Week 1): Pt will complete squat pivot to toilet with mod assist of one caregiver OT Short Term Goal 2 (Week 1): Pt will complete transfer into tub/shower with tub bench and mod assist of one caregiver OT Short Term Goal 3 (Week 1): Pt will complete bathing with mod assist of one caregiver OT Short Term Goal 4 (Week 1): Pt will complete LB dressing with max assist of one caregiver OT Short Term Goal 5 (Week 1): Pt will scan to Lt visual field to locate items to complete self-care tasks with min cues and increased time  Skilled Therapeutic Interventions/Progress Updates:    Treatment session with focus on initiation, participation in self-care tasks, and Lt attention.  Pt in bed upon arrival initially refusing therapy due to fatigue and headache.  Pt's wife present and reporting pt having just taken pain medication. Pt educated on need for mobility and participation in treatment sessions for recovery.  Pt perseverated on need for sleep and wanting to return to bed.  Able to convince pt to come to sitting at EOB, requiring max assist and increased cues for encouragement.  Engaged in visual scanning to Lt to locate [place]'s [place].  Completed modified bathing and dressing from EOB, with therapist seated on Lt side to promote upright sitting balance and encourage weight shifting to Rt when reaching for bathing items.  Pt continued to perseverate on returning to bed, but able to be redirected to attend to bathing and dressing tasks.  Completed sit > stand with mod assist while 2nd person pulled up pants.  However upon standing noted brief to be sweaty and returned to bed to don clean brief.  Engaged in bridging at bed level with  therapist providing tactile cues to LLE to promote weight bearing while pt bridged to assist with pulling pants over hips.  Left pt in sidelying on Lt side, educated pt and wife on bed positioning and safety with LUE.  Therapy Documentation Precautions:  Precautions Precautions: Fall Precaution Comments: L hemiparesis/flaccid Restrictions Weight Bearing Restrictions: No Vital Signs: Therapy Vitals Temp: 97.6 F (36.4 C) Temp Source: Oral Pulse Rate: 64 Resp: 18 BP: 139/62 mmHg Patient Position (if appropriate): Lying Oxygen Therapy SpO2: 97 % O2 Device: Not Delivered Pain: Pt with c/o headache and lower back pain, RN aware.  Repositioned  See Function Navigator for Current Functional Status.   Therapy/Group: Individual Therapy  [name], Haliyah Fryman 01/16/2016, 9:46 AM

## 2016-01-16 NOTE — Progress Notes (Addendum)
Subjective/Complaints: Upper abdominal discomfort today, gives a history of heartburn and took medicine for this at home. Also  lactoseintolerant  Review of systems denies chest pain shortness of breath nausea vomiting diarrhea or constipation, tired all the time  Objective: Vital Signs: Blood pressure 139/62, pulse 64, temperature 97.6 F (36.4 C), temperature source Oral, resp. rate 18, SpO2 97 %. No results found. Results for orders placed or performed during the hospital encounter of 01/14/16 (from the past 72 hour(s))  CBC     Status: Abnormal   Collection Time: 01/14/16  4:57 PM  Result Value Ref Range   WBC 12.2 (H) 4.0 - 10.5 K/uL   RBC 4.45 4.22 - 5.81 MIL/uL   Hemoglobin 15.0 13.0 - 17.0 g/dL   HCT 44.2 39.0 - 52.0 %   MCV 99.3 78.0 - 100.0 fL   MCH 33.7 26.0 - 34.0 pg   MCHC 33.9 30.0 - 36.0 g/dL   RDW 12.1 11.5 - 15.5 %   Platelets 154 150 - 400 K/uL  Creatinine, serum     Status: None   Collection Time: 01/14/16  4:57 PM  Result Value Ref Range   Creatinine, Ser 0.65 0.61 - 1.24 mg/dL   GFR calc non Af Amer >60 >60 mL/min   GFR calc Af Amer >60 >60 mL/min    Comment: (NOTE) The eGFR has been calculated using the CKD EPI equation. This calculation has not been validated in all clinical situations. eGFR's persistently <60 mL/min signify possible Chronic Kidney Disease.   CBC WITH DIFFERENTIAL     Status: Abnormal   Collection Time: 01/15/16  5:53 AM  Result Value Ref Range   WBC 9.8 4.0 - 10.5 K/uL   RBC 4.24 4.22 - 5.81 MIL/uL   Hemoglobin 14.4 13.0 - 17.0 g/dL   HCT 42.2 39.0 - 52.0 %   MCV 99.5 78.0 - 100.0 fL   MCH 34.0 26.0 - 34.0 pg   MCHC 34.1 30.0 - 36.0 g/dL   RDW 12.2 11.5 - 15.5 %   Platelets 163 150 - 400 K/uL   Neutrophils Relative % 72 %   Neutro Abs 7.0 1.7 - 7.7 K/uL   Lymphocytes Relative 12 %   Lymphs Abs 1.2 0.7 - 4.0 K/uL   Monocytes Relative 15 %   Monocytes Absolute 1.4 (H) 0.1 - 1.0 K/uL   Eosinophils Relative 1 %   Eosinophils Absolute 0.1 0.0 - 0.7 K/uL   Basophils Relative 0 %   Basophils Absolute 0.0 0.0 - 0.1 K/uL  Comprehensive metabolic panel     Status: Abnormal   Collection Time: 01/15/16  5:53 AM  Result Value Ref Range   Sodium 137 135 - 145 mmol/L   Potassium 3.8 3.5 - 5.1 mmol/L   Chloride 102 101 - 111 mmol/L   CO2 24 22 - 32 mmol/L   Glucose, Bld 108 (H) 65 - 99 mg/dL   BUN 12 6 - 20 mg/dL   Creatinine, Ser 0.66 0.61 - 1.24 mg/dL   Calcium 9.5 8.9 - 10.3 mg/dL   Total Protein 7.5 6.5 - 8.1 g/dL   Albumin 3.4 (L) 3.5 - 5.0 g/dL   AST 72 (H) 15 - 41 U/L   ALT 61 17 - 63 U/L   Alkaline Phosphatase 118 38 - 126 U/L   Total Bilirubin 0.9 0.3 - 1.2 mg/dL   GFR calc non Af Amer >60 >60 mL/min   GFR calc Af Amer >60 >60 mL/min    Comment: (  NOTE) The eGFR has been calculated using the CKD EPI equation. This calculation has not been validated in all clinical situations. eGFR's persistently <60 mL/min signify possible Chronic Kidney Disease.    Anion gap 11 5 - 15    HEENT: normal Cardio: RRR Resp: CTA B/L and unlabored GI: BS positive and mild epigastric distention and tenderness Extremity:  Pulses positive and No Edema Skin:   Other left antecubital fossa with erythema at  prior IV site,, mild  macular rash Neuro: Confused, Cranial Nerve Abnormalities left central 7, Abnormal Sensory Decreased sensation left upper and left lower limb, Abnormal Motor 0/5 left upper extremity 0/5 left lower extremity 5/5 right upper tremor E5/5  right lower extremity, Tone:  Increased tone  in the left elbow flexor as well as left knee extensor Ashworth grade 3 in the lower limb and 2 in the upper limb and Other affect is inappropriate Musc/Skel:  Other mild pain with  elbow and knee range of motion on the left side pain is in the biceps  and  quad muscle respectively. Not in the joint Gen. no acute distress   Assessment/Plan: 1. Functional deficits secondary to right hemiparesis causing left  hemiplegia which require 3+ hours per day of interdisciplinary therapy in a comprehensive inpatient rehab setting. Physiatrist is providing close team supervision and 24 hour management of active medical problems listed below. Physiatrist and rehab team continue to assess barriers to discharge/monitor patient progress toward functional and medical goals. FIM: Function - Bathing Position: Sitting EOB Body parts bathed by patient: Right upper leg, Left upper leg (pt initially refusing bathing) Body parts bathed by helper: Front perineal area, Buttocks Bathing not applicable: Left lower leg, Right lower leg, Left upper leg Assist Level: 2 helpers (Max assist, 2 helpers for buttocks in standing)  Function- Upper Body Dressing/Undressing Upper body dressing/undressing activity did not occur: Refused What is the patient wearing?: Pull over shirt/dress Pull over shirt/dress - Perfomed by helper: Thread/unthread right sleeve, Thread/unthread left sleeve, Put head through opening, Pull shirt over trunk Assist Level:  (Total assist) Function - Lower Body Dressing/Undressing What is the patient wearing?: Pants, Non-skid slipper socks Position: Sitting EOB Pants- Performed by helper: Thread/unthread right pants leg, Thread/unthread left pants leg, Pull pants up/down Non-skid slipper socks- Performed by helper: Don/doff right sock, Don/doff left sock Assist for lower body dressing: 2 Helpers  Function - Toileting Toileting activity did not occur: No continent bowel/bladder event Toileting steps completed by helper: Adjust clothing prior to toileting, Performs perineal hygiene, Adjust clothing after toileting Assist level: Two helpers  Function - Air cabin crew transfer activity did not occur: Safety/medical concerns Toilet transfer assistive device: Grab bar Assist level to toilet: Maximal assist (Pt 25 - 49%/lift and lower) Assist level from toilet: 2 helpers  Function - Chair/bed  transfer Chair/bed transfer method: Lateral scoot Chair/bed transfer assist level: Maximal assist (Pt 25 - 49%/lift and lower) Chair/bed transfer assistive device: Armrests Chair/bed transfer details: Verbal cues for precautions/safety, Tactile cues for placement, Tactile cues for sequencing, Tactile cues for weight shifting, Verbal cues for technique  Function - Locomotion: Wheelchair Will patient use wheelchair at discharge?:  (TBD) Max wheelchair distance: 50 Assist Level: Moderate assistance (Pt 50 - 74%) Assist Level: Moderate assistance (Pt 50 - 74%) Wheel 150 feet activity did not occur: Safety/medical concerns Turns around,maneuvers to table,bed, and toilet,negotiates 3% grade,maneuvers on rugs and over doorsills: No Function - Locomotion: Ambulation Ambulation activity did not occur: Safety/medical concerns  Function -  Comprehension Comprehension: Auditory Comprehension assist level: Understands basic 75 - 89% of the time/ requires cueing 10 - 24% of the time  Function - Expression Expression: Verbal Expression assist level: Expresses basic 50 - 74% of the time/requires cueing 25 - 49% of the time. Needs to repeat parts of sentences.  Function - Social Interaction Social Interaction assist level: Interacts appropriately 50 - 74% of the time - May be physically or verbally inappropriate.  Function - Problem Solving Problem solving assist level: Solves basic 25 - 49% of the time - needs direction more than half the time to initiate, plan or complete simple activities  Function - Memory Memory assist level: Recognizes or recalls 25 - 49% of the time/requires cueing 50 - 75% of the time Patient normally able to recall (first 3 days only): Current season, That he or she is in a hospital  Medical Problem List and Plan: 1.Left hemiplegia   secondary to right basal ganglia hemorrhage secondary to hypertensive crisis initiate CIR PT, OT SLP 2.  DVT Prophylaxis/Anticoagulation:  Subcutaneous heparin initiated 01/10/2016. Monitor for any bleeding episodes 3. Pain Management/headaches: Tylenol 3 as needed, Post stroke headaches start Topamax 25 twice a day May need to titrate upwards- no headache complaints this morning 4. Dysphagia. Dysphagia #1 nectar liquids. Follow-up speech therapy. 5. Neuropsych: This patient is capable of making decisions on his own behalf. 6. Skin/Wound Care: Routine skin checks, Start Keflex for left antecubital fossa  Cellulitis, superficial 7. Fluids/Electrolytes/Nutrition: Routine I&O's with follow-up chemistries changed to lactose-free diet this may help with abdominal discomfort 8. Hypertension. Norvasc 10 mg daily, Lopressor 75 mg twice a day. Monitor with increased mobility 9. History of alcohol use. Provided counseling monitor for any signs of withdrawal 10. Hyperlipidemia. Pravachol 11. Mood. Zoloft 50 mg daily. Provide emotional support 12. Urinary tract infection will start Keflex pending culture results, Keflex may be causing some nausea  LOS (Days) 2 A FACE TO FACE EVALUATION WAS PERFORMED  Chong January E 01/16/2016, 10:43 AM

## 2016-01-16 NOTE — Progress Notes (Signed)
Speech Language Pathology Daily Session Note  Patient Details  Name: Jon [name] MRN: [id number] Date of Birth: Jan 14, 1956  Today's Date: 01/16/2016 SLP Individual Time: 1400-1445 SLP Individual Time Calculation (min): 45 min  Short Term Goals: Week 1: SLP Short Term Goal 1 (Week 1): Pt will sustain his attention to basic tasks for 1-2 minute intervals with mod assist verbal cues for redirection.  SLP Short Term Goal 2 (Week 1): Pt will verbalize at least 2 deficits s/p CVA with mod assist question cues.   SLP Short Term Goal 3 (Week 1): Pt will be intelligible at the conversational level with min assist verbal cues for overarticulation, increased vocal intensity and slow rate of speech.   SLP Short Term Goal 4 (Week 1): Pt will consume dys 3 textures and thin liquids with min assist verbal cues for use of swallowing precautions.   SLP Short Term Goal 5 (Week 1): Pt will visually scan to the left of midline during basic, famliar tasks with max assist multimodal cues in 50% of opportunities.    Skilled Therapeutic Interventions: Pt alert, but required encouragement to participate with therapy. Pt appeared to best respond to firm guidelines. Pt was unwilling to eat (refused breakfast and lunch), but tolerated Ensure without s/s aspiration- required feeding assist secondary to poor initiation. Pt was easily self distracted and required mod A verbal cueing to attend to task.Pt O x 4 generally, but required clarifying questions to provide full information. Pt declined to participate in multiple activities, but was able to provide verbal sequencing of a functional activity related to an interest (gardening/cooking).   Function:  Eating Eating                 Cognition Comprehension Comprehension assist level: Understands basic 75 - 89% of the time/ requires cueing 10 - 24% of the time  Expression   Expression assist level: Expresses basic 50 - 74% of the time/requires cueing 25 - 49% of  the time. Needs to repeat parts of sentences.  Social Interaction Social Interaction assist level: Interacts appropriately 50 - 74% of the time - May be physically or verbally inappropriate.  Problem Solving Problem solving assist level: Solves basic 25 - 49% of the time - needs direction more than half the time to initiate, plan or complete simple activities  Memory Memory assist level: Recognizes or recalls 25 - 49% of the time/requires cueing 50 - 75% of the time    Pain Pain Assessment Pain Assessment: No/denies pain  Therapy/Group: Individual Therapy  Rocky CraftsKara E Jaimin Krupka MA, CCC-SLP 01/16/2016, 4:06 PM

## 2016-01-16 NOTE — Progress Notes (Addendum)
Physical Therapy Session Note  Patient Details  Name: Jon [name] MRN: [id number] Date of Birth: 1956-01-11  Today's Date: 01/16/2016 PT Individual Time: 0805-0820, [id number] PT Individual Time Calculation (min): 15 min, 75 min   Short Term Goals: Week 1:  PT Short Term Goal 1 (Week 1): Pt will perform squat pivot transfer w/c <>bed with modA PT Short Term Goal 2 (Week 1): Pt will perform sit <>stand modA PT Short Term Goal 3 (Week 1): Pt will perform w/c propulsion minA with R hemi technique PT Short Term Goal 4 (Week 1): Pt will initiate gait training  Skilled Therapeutic Interventions/Progress Updates:    First session cut short and limited by patient unwillingness to participate. Pt educated at length on need for mobility and risks of immobilization, but still elects to defer after sitting up with pt to take medication. Pt amenable to PT in later session. Pt though perseverative on getting back in bed benefitting from behavior management to increase upright tolerance and initiation of functional mobility tasks.   Therapy Documentation Precautions:  Precautions Precautions: Fall Precaution Comments: L hemiparesis/flaccid Restrictions Weight Bearing Restrictions: No General: PT Amount of Missed Time (min): 15 Minutes (pt refuses after sitting up to take medication with PT) PT Missed Treatment Reason: Patient unwilling to participate Vital Signs: Therapy Vitals Temp: 97.6 F (36.4 C) Temp Source: Oral Pulse Rate: 64 Resp: 18 BP: 139/62 mmHg Patient Position (if appropriate): Lying Oxygen Therapy SpO2: 97 % O2 Device: Not Delivered Pain: Pain Assessment Pain Assessment: No/denies pain Mobility:  Dependent for bed mobility with cues for weight shift and technique Other Treatments:    Tx 1: Pt educated on risks of immobilization, progressing mobility, safety in mobility, rehab prognosis, and benefits of mobilization. Pt performs static sitting x5'.   Tx 2: Pt performs  transfers x3 in session. Behavior management performed including education, reducing demands, providing low stim environment, using redirection techniques, and active listening. Pt performs anterior weight shifts 2x10. Pt performs RUE reaching for weight shift x10 in chair. Pt performs w/c propulsion 10' x5 in session with cues for technique.   See Function Navigator for Current Functional Status.   Therapy/Group: Individual Therapy  [name], [name] 01/16/2016, 8:30 AM

## 2016-01-17 ENCOUNTER — Inpatient Hospital Stay (HOSPITAL_COMMUNITY): Payer: PRIVATE HEALTH INSURANCE | Admitting: Physical Therapy

## 2016-01-17 MED ORDER — DIPHENHYDRAMINE HCL 12.5 MG/5ML PO ELIX
25.0000 mg | ORAL_SOLUTION | Freq: Every evening | ORAL | Status: DC | PRN
  Administered 2016-01-17 – 2016-01-20 (×4): 25 mg via ORAL
  Filled 2016-01-17 (×5): qty 10

## 2016-01-17 NOTE — IPOC Note (Signed)
Overall Plan of Care [facility](IPOC) Patient Details Name: Jon [name] MRN: [id number] DOB: 12/17/1955  Admitting Diagnosis: CVA  Hospital Problems: Principal Problem:   Hemorrhagic stroke (HCC) R basal ganglia d/t HTN Active Problems:   Left hemiparesis (HCC)   Dysphagia, post-stroke   Gait disturbance, post-stroke   Urinary tract infection, site not specified   Hemiparesis affecting nondominant side as late effect of cerebrovascular accident (HCC)   Cognitive deficit, post-stroke   Basal ganglia hemorrhage (HCC)     Functional Problem List: Nursing Bladder, Bowel, Endurance, Medication Management, Motor, Skin Integrity, Sensory, Safety, Perception, Pain, Nutrition  PT Balance, Behavior, Perception, Safety, Sensory, Endurance, Motor, Pain  OT Balance, Cognition, Endurance, Motor, Pain, Perception, Safety, Sensory, Skin Integrity, Vision  SLP Cognition, Linguistic, Nutrition  TR         Basic ADL's: OT Eating, Grooming, Bathing, Dressing, Toileting     Advanced  ADL's: OT       Transfers: PT Bed Mobility, Bed to Chair, Car, Occupational psychologisturniture  OT Toilet, Research scientist (life sciences)Tub/Shower     Locomotion: PT Ambulation, Psychologist, prison and probation servicesWheelchair Mobility, Stairs     Additional Impairments: OT Fuctional Use of Upper Extremity  SLP Swallowing, Communication, Social Cognition expression Problem Solving, Memory, Attention, Awareness  TR      Anticipated Outcomes Item Anticipated Outcome  Self Feeding Supervision/setup  Swallowing  Supervision    Basic self-care  Min assist  Toileting  Min assist   Bathroom Transfers Min assist  Bowel/Bladder  Mod assist  Transfers  minA  Locomotion  modA in controlled environment  Communication  Supervision   Cognition  Min assist   Pain  < 5  Safety/Judgment  Min assist   Therapy Plan: PT Intensity: Minimum of 1-2 x/day ,45 to 90 minutes PT Frequency: 5 out of 7 days PT Duration Estimated Length of Stay: 21-24 days OT Intensity: Minimum of 1-2 x/day, 45 to 90  minutes OT Frequency: 5 out of 7 days OT Duration/Estimated Length of Stay: 21-24 days SLP Intensity: Minumum of 1-2 x/day, 30 to 90 minutes SLP Frequency: 3 to 5 out of 7 days SLP Duration/Estimated Length of Stay: 24-27 days        Team Interventions: Nursing Interventions Patient/Family Education, Bladder Management, Bowel Management, Medication Management, Pain Management, Disease Management/Prevention, Skin Care/Wound Management, Cognitive Remediation/Compensation, Dysphagia/Aspiration Precaution Training  PT interventions Ambulation/gait training, Warden/rangerBalance/vestibular training, Community reintegration, Cognitive remediation/compensation, Discharge planning, Disease management/prevention, DME/adaptive equipment instruction, Functional mobility training, Neuromuscular re-education, Patient/family education, Pain management, Stair training, Therapeutic Activities, Therapeutic Exercise, Splinting/orthotics, UE/LE Strength taining/ROM, UE/LE Coordination activities, Visual/perceptual remediation/compensation, Wheelchair propulsion/positioning  OT Interventions Warden/rangerBalance/vestibular training, Cognitive remediation/compensation, Discharge planning, Disease mangement/prevention, DME/adaptive equipment instruction, Functional electrical stimulation, Functional mobility training, Neuromuscular re-education, Pain management, Patient/family education, Psychosocial support, Self Care/advanced ADL retraining, Splinting/orthotics, Therapeutic Activities, Therapeutic Exercise, UE/LE Strength taining/ROM, UE/LE Coordination activities, Visual/perceptual remediation/compensation, Wheelchair propulsion/positioning  SLP Interventions Cognitive remediation/compensation, Financial traderCueing hierarchy, Dysphagia/aspiration precaution training, Environmental controls, Internal/external aids, Multimodal communication approach, Patient/family education  TR Interventions    SW/CM Interventions Discharge Planning, Psychosocial Support,  Patient/Family Education    Team Discharge Planning: Destination: PT-Home (Home vs SNF depending on progress) ,OT- Home (vs SNF) , SLP-Home Projected Follow-up: PT-Home health PT, OT-  Home health OT, 24 hour supervision/assistance (vs SNF), SLP-24 hour supervision/assistance, Home Health SLP, Outpatient SLP Projected Equipment Needs: PT-To be determined, OT- 3 in 1 bedside comode, Tub/shower bench, SLP-None recommended by SLP Equipment Details: PT- , OT-  Patient/family involved in discharge planning: PT- Patient,  OT-Patient, SLP-Patient  MD ELOS: 20-22d Medical Rehab Prognosis:  Good Assessment: 60 y.o. right handed male with history of hypertension As well as alcohol use. Patient on no prescription medications. He is married and worked full-time prior to admission. Wife can assist on discharge. Presented 01/08/2016 with acute onset of headache and left-sided weakness. Initial blood pressure was reportedly elevated. CT of the head showed right basal ganglia hemorrhage with 31 mL volume. 3 mm midline shift. CT angiogram head and neck with no intracranial or extracranial stenosis dissection or aneurysm. Echocardiogram with ejection fraction of 65% no wall motion abnormalities. Patient did not receive TPA secondary to ICH. Subcutaneous heparin for DVT prophylaxis added 01/10/2016 Dysphagia #1 nectar thick liquid diet. Physical therapy evaluation completed 01/10/2016 with recommendations of physical medicine rehabilitation consult.   Now requiring 24/7 Rehab RN,MD, as well as CIR level PT, OT and SLP.  Treatment team will focus on ADLs and mobility [facility]  See Team Conference Notes for weekly updates to the plan of care

## 2016-01-17 NOTE — Progress Notes (Signed)
Subjective/Complaints: Abdominal pain resolved  Review of systems denies chest pain shortness of breath nausea vomiting diarrhea or constipation, tired all the time  Objective: Vital Signs: Blood pressure 125/56, pulse 61, temperature 99 F (37.2 C), temperature source Oral, resp. rate 18, SpO2 97 %. No results found. Results for orders placed or performed during the hospital encounter of 01/14/16 (from the past 72 hour(s))  CBC     Status: Abnormal   Collection Time: 01/14/16  4:57 PM  Result Value Ref Range   WBC 12.2 (H) 4.0 - 10.5 K/uL   RBC 4.45 4.22 - 5.81 MIL/uL   Hemoglobin 15.0 13.0 - 17.0 g/dL   HCT 44.2 39.0 - 52.0 %   MCV 99.3 78.0 - 100.0 fL   MCH 33.7 26.0 - 34.0 pg   MCHC 33.9 30.0 - 36.0 g/dL   RDW 12.1 11.5 - 15.5 %   Platelets 154 150 - 400 K/uL  Creatinine, serum     Status: None   Collection Time: 01/14/16  4:57 PM  Result Value Ref Range   Creatinine, Ser 0.65 0.61 - 1.24 mg/dL   GFR calc non Af Amer >60 >60 mL/min   GFR calc Af Amer >60 >60 mL/min    Comment: (NOTE) The eGFR has been calculated using the CKD EPI equation. This calculation has not been validated in all clinical situations. eGFR's persistently <60 mL/min signify possible Chronic Kidney Disease.   CBC WITH DIFFERENTIAL     Status: Abnormal   Collection Time: 01/15/16  5:53 AM  Result Value Ref Range   WBC 9.8 4.0 - 10.5 K/uL   RBC 4.24 4.22 - 5.81 MIL/uL   Hemoglobin 14.4 13.0 - 17.0 g/dL   HCT 42.2 39.0 - 52.0 %   MCV 99.5 78.0 - 100.0 fL   MCH 34.0 26.0 - 34.0 pg   MCHC 34.1 30.0 - 36.0 g/dL   RDW 12.2 11.5 - 15.5 %   Platelets 163 150 - 400 K/uL   Neutrophils Relative % 72 %   Neutro Abs 7.0 1.7 - 7.7 K/uL   Lymphocytes Relative 12 %   Lymphs Abs 1.2 0.7 - 4.0 K/uL   Monocytes Relative 15 %   Monocytes Absolute 1.4 (H) 0.1 - 1.0 K/uL   Eosinophils Relative 1 %   Eosinophils Absolute 0.1 0.0 - 0.7 K/uL   Basophils Relative 0 %   Basophils Absolute 0.0 0.0 - 0.1 K/uL   Comprehensive metabolic panel     Status: Abnormal   Collection Time: 01/15/16  5:53 AM  Result Value Ref Range   Sodium 137 135 - 145 mmol/L   Potassium 3.8 3.5 - 5.1 mmol/L   Chloride 102 101 - 111 mmol/L   CO2 24 22 - 32 mmol/L   Glucose, Bld 108 (H) 65 - 99 mg/dL   BUN 12 6 - 20 mg/dL   Creatinine, Ser 0.66 0.61 - 1.24 mg/dL   Calcium 9.5 8.9 - 10.3 mg/dL   Total Protein 7.5 6.5 - 8.1 g/dL   Albumin 3.4 (L) 3.5 - 5.0 g/dL   AST 72 (H) 15 - 41 U/L   ALT 61 17 - 63 U/L   Alkaline Phosphatase 118 38 - 126 U/L   Total Bilirubin 0.9 0.3 - 1.2 mg/dL   GFR calc non Af Amer >60 >60 mL/min   GFR calc Af Amer >60 >60 mL/min    Comment: (NOTE) The eGFR has been calculated using the CKD EPI equation. This calculation has not  been validated in all clinical situations. eGFR's persistently <60 mL/min signify possible Chronic Kidney Disease.    Anion gap 11 5 - 15    HEENT: normal Cardio: RRR Resp: CTA B/L and unlabored GI: BS positive and mild epigastric distention and tenderness Extremity:  Pulses positive and No Edema Skin:   Other left antecubital fossa with erythema at  prior IV site,, mild  macular rash Neuro: Confused, Cranial Nerve Abnormalities left central 7, Abnormal Sensory Decreased sensation left upper and left lower limb, Abnormal Motor 0/5 left upper extremity 0/5 left lower extremity 5/5 right upper tremor E5/5  right lower extremity, Tone:  Increased tone  in the left elbow flexor as well as left knee extensor Ashworth grade 3 in the lower limb and 2 in the upper limb and Other affect is inappropriate Musc/Skel:  Other mild pain with  elbow and knee range of motion on the left side pain is in the biceps  and  quad muscle respectively. Not in the joint Gen. no acute distress   Assessment/Plan: 1. Functional deficits secondary to right hemiparesis causing left hemiplegia which require 3+ hours per day of interdisciplinary therapy in a comprehensive inpatient rehab  setting. Physiatrist is providing close team supervision and 24 hour management of active medical problems listed below. Physiatrist and rehab team continue to assess barriers to discharge/monitor patient progress toward functional and medical goals. FIM: Function - Bathing Position: Sitting EOB Body parts bathed by patient: Right upper leg, Left upper leg (pt initially refusing bathing) Body parts bathed by helper: Front perineal area, Buttocks Bathing not applicable: Left lower leg, Right lower leg, Left upper leg Assist Level: 2 helpers (Max assist, 2 helpers for buttocks in standing)  Function- Upper Body Dressing/Undressing Upper body dressing/undressing activity did not occur: Refused What is the patient wearing?: Pull over shirt/dress Pull over shirt/dress - Perfomed by helper: Thread/unthread right sleeve, Thread/unthread left sleeve, Put head through opening, Pull shirt over trunk Assist Level:  (Total assist) Function - Lower Body Dressing/Undressing What is the patient wearing?: Pants, Non-skid slipper socks Position: Sitting EOB Pants- Performed by helper: Thread/unthread right pants leg, Thread/unthread left pants leg, Pull pants up/down Non-skid slipper socks- Performed by helper: Don/doff right sock, Don/doff left sock Assist for lower body dressing: 2 Helpers  Function - Toileting Toileting activity did not occur: No continent bowel/bladder event Toileting steps completed by helper: Adjust clothing prior to toileting, Performs perineal hygiene, Adjust clothing after toileting Assist level: Two helpers  Function - Air cabin crew transfer activity did not occur: Safety/medical concerns Toilet transfer assistive device: Grab bar Assist level to toilet: Maximal assist (Pt 25 - 49%/lift and lower) Assist level from toilet: 2 helpers  Function - Chair/bed transfer Chair/bed transfer method: Lateral scoot Chair/bed transfer assist level: Maximal assist (Pt 25 -  49%/lift and lower) Chair/bed transfer assistive device: Armrests Chair/bed transfer details: Verbal cues for precautions/safety, Tactile cues for placement, Tactile cues for sequencing, Tactile cues for weight shifting, Verbal cues for technique  Function - Locomotion: Wheelchair Will patient use wheelchair at discharge?:  (TBD) Max wheelchair distance: 50 Assist Level: Moderate assistance (Pt 50 - 74%) Assist Level: Moderate assistance (Pt 50 - 74%) Wheel 150 feet activity did not occur: Safety/medical concerns Turns around,maneuvers to table,bed, and toilet,negotiates 3% grade,maneuvers on rugs and over doorsills: No Function - Locomotion: Ambulation Ambulation activity did not occur: Safety/medical concerns  Function - Comprehension Comprehension: Auditory Comprehension assist level: Understands basic 90% of the time/cues < 10% of  the time  Function - Expression Expression: Verbal Expression assist level: Expresses basic 50 - 74% of the time/requires cueing 25 - 49% of the time. Needs to repeat parts of sentences.  Function - Social Interaction Social Interaction assist level: Interacts appropriately 50 - 74% of the time - May be physically or verbally inappropriate.  Function - Problem Solving Problem solving assist level: Solves basic 25 - 49% of the time - needs direction more than half the time to initiate, plan or complete simple activities  Function - Memory Memory assist level: Recognizes or recalls 25 - 49% of the time/requires cueing 50 - 75% of the time Patient normally able to recall (first 3 days only): Current season, That he or she is in a hospital  Medical Problem List and Plan: 1.Left hemiplegia   secondary to right basal ganglia hemorrhage secondary to hypertensive crisis initiate CIR PT, OT SLP 2.  DVT Prophylaxis/Anticoagulation: Subcutaneous heparin initiated 01/10/2016. Monitor for any bleeding episodes 3. Pain Management/headaches: Tylenol 3 as needed,  Post stroke headaches start Topamax 25 twice a day May need to titrate upwards- no headache complaints this morning 4. Dysphagia. Dysphagia #1 nectar liquids. Follow-up speech therapy. 5. Neuropsych: This patient is capable of making decisions on his own behalf. 6. Skin/Wound Care: Routine skin checks, Start Keflex for left antecubital fossa  Cellulitis, superficial 7. Fluids/Electrolytes/Nutrition: Routine I&O's with follow-up chemistries changed to lactose-free diet this is helping with abdominal discomfort 8. Hypertension. Norvasc 10 mg daily, Lopressor 75 mg twice a day. Monitor with increased mobility 9. History of alcohol use. Provided counseling monitor for any signs of withdrawal 10. Hyperlipidemia. Pravachol 11. Mood. Zoloft 50 mg daily. Provide emotional support 12. Urinary tract infection will start Keflex pending culture results, Keflex may be causing some nausea  LOS (Days) 3 A FACE TO FACE EVALUATION WAS PERFORMED  , E 01/17/2016, 11:02 AM

## 2016-01-17 NOTE — Progress Notes (Signed)
Physical Therapy Session Note  Patient Details  Name: Jon [name] MRN: [id number] Date of Birth: [birth year]  Today's Date: 01/17/2016 PT Individual Time: 1415-1500 PT Individual Time Calculation (min): 45 min   Short Term Goals: Week 1:  PT Short Term Goal 1 (Week 1): Pt will perform squat pivot transfer w/c <>bed with modA PT Short Term Goal 2 (Week 1): Pt will perform sit <>stand modA PT Short Term Goal 3 (Week 1): Pt will perform w/c propulsion minA with R hemi technique PT Short Term Goal 4 (Week 1): Pt will initiate gait training  Skilled Therapeutic Interventions/Progress Updates:   Patient supine in bed upon arrival, required mod encouragement to participate in therapy. Patient using humor to deflect from impairments throughout session. Patient transferred supine > sit with assist for LUE and LLE to roll and bring LLE off bed but patient able to use bed rail with RUE to elevate trunk to sitting. Performed static sitting EOB with RUE on bed rail with close supervision, patient with LOB to L when hand support removed. Performed squat pivot transfer bed > wheelchair with max A and increased difficulty as patient unwilling to let go of bed rail. Patient propelled wheelchair using RUE with mod A overall. Patient able to scan to L field with max verbal cues. Performed sit <> stand transfer training using R rail in hallway from wheelchair with mod A x 2 and min A x 1 when patient impulsively stood with max verbal/tactile cues for upright posture, midline orientation, forward gaze, hip ext to neutral, and L knee extension for stance stability and equal WB BLE. Switched out 20 x 16 wheelchair for 18 x 18 wheelchair with specialty back and pressure relieving cushion for improved sitting tolerance and midline orientation with patient reporting improved comfort. Patient left sitting in wheelchair with quick release belt donned and wife present.    Therapy Documentation Precautions:   Precautions Precautions: Fall Precaution Comments: L hemiparesis/flaccid Restrictions Weight Bearing Restrictions: No Pain:  Unrated back pain, switched out wheelchair for improved fit  See Function Navigator for Current Functional Status.   Therapy/Group: Individual Therapy  Kerney ElbeVarner, Braeton Wolgamott A 01/17/2016, 3:28 PM

## 2016-01-18 ENCOUNTER — Inpatient Hospital Stay (HOSPITAL_COMMUNITY): Payer: PRIVATE HEALTH INSURANCE | Admitting: Speech Pathology

## 2016-01-18 ENCOUNTER — Inpatient Hospital Stay (HOSPITAL_COMMUNITY): Payer: PRIVATE HEALTH INSURANCE | Admitting: Occupational Therapy

## 2016-01-18 ENCOUNTER — Inpatient Hospital Stay (HOSPITAL_COMMUNITY): Payer: PRIVATE HEALTH INSURANCE | Admitting: Physical Therapy

## 2016-01-18 MED ORDER — MUSCLE RUB 10-15 % EX CREA
TOPICAL_CREAM | Freq: Two times a day (BID) | CUTANEOUS | Status: DC
  Administered 2016-01-19 – 2016-01-23 (×7): via TOPICAL
  Filled 2016-01-18: qty 85

## 2016-01-18 MED ORDER — GRX ANALGESIC BALM EX OINT
2.0000 g | TOPICAL_OINTMENT | Freq: Two times a day (BID) | CUTANEOUS | Status: DC
Start: 2016-01-18 — End: 2016-01-18

## 2016-01-18 NOTE — Progress Notes (Signed)
Subjective/Complaints: Patient appears more alert this morning. He has pain in the low back area and is asking about a topical analgesic. Patient states that one time he was drinking the equivalent of 30 beers per day but continue to work full time  Review of systems denies chest pain shortness of breath nausea vomiting diarrhea or constipation, tired all the time  Objective: Vital Signs: Blood pressure 130/65, pulse 65, temperature 97.5 F (36.4 C), temperature source Oral, resp. rate 18, SpO2 100 %. No results found. No results found for this or any previous visit (from the past 72 hour(s)).  HEENT: normal Cardio: RRR Resp: CTA B/L and unlabored GI: BS positive and mild epigastric distention and tenderness Extremity:  Pulses positive and No Edema Skin:   Other left antecubital fossa with erythema at  prior IV site,, mild  macular rash Neuro: Confused, Cranial Nerve Abnormalities left central 7, Abnormal Sensory Decreased sensation left upper and left lower limb, Abnormal Motor 0/5 left upper extremity 0/5 left lower extremity 5/5 right upper tremor E5/5  right lower extremity, Tone:  Increased tone  in the left elbow flexor as well as left knee extensor Ashworth grade 3 in the lower limb and 2 in the upper limb and Other affect is inappropriate Musc/Skel:  Other mild pain with  elbow and knee range of motion on the left side pain is in the biceps  and  quad muscle respectively. Not in the joint Gen. no acute distress   Assessment/Plan: 1. Functional deficits secondary to right hemiparesis causing left hemiplegia which require 3+ hours per day of interdisciplinary therapy in a comprehensive inpatient rehab setting. Physiatrist is providing close team supervision and 24 hour management of active medical problems listed below. Physiatrist and rehab team continue to assess barriers to discharge/monitor patient progress toward functional and medical goals. FIM: Function -  Bathing Position: Bed (pt too fatigued to sit up) Body parts bathed by patient: Chest, Abdomen, Left arm Body parts bathed by helper: Right arm Bathing not applicable: Front perineal area, Buttocks, Right upper leg, Left upper leg, Right lower leg, Left lower leg (pt declined) Assist Level: 2 helpers (Max assist, 2 helpers for buttocks in standing)  Function- Upper Body Dressing/Undressing Upper body dressing/undressing activity did not occur: Refused What is the patient wearing?: Pull over shirt/dress Pull over shirt/dress - Perfomed by patient: Thread/unthread right sleeve, Put head through opening Pull over shirt/dress - Perfomed by helper: Thread/unthread left sleeve, Pull shirt over trunk Assist Level:  (Total assist) Function - Lower Body Dressing/Undressing What is the patient wearing?: Pants Position: Bed Pants- Performed by helper: Thread/unthread right pants leg, Thread/unthread left pants leg, Pull pants up/down Non-skid slipper socks- Performed by helper: Don/doff right sock, Don/doff left sock Assist for lower body dressing: 2 Helpers  Function - Toileting Toileting activity did not occur: No continent bowel/bladder event Toileting steps completed by helper: Adjust clothing prior to toileting, Performs perineal hygiene, Adjust clothing after toileting Toileting Assistive Devices: Grab bar or rail Assist level: Two helpers  Function - ArchivistToilet Transfers Toilet transfer activity did not occur: Safety/medical concerns Toilet transfer assistive device: Grab bar Assist level to toilet: Maximal assist (Pt 25 - 49%/lift and lower) Assist level from toilet: 2 helpers  Function - Chair/bed transfer Chair/bed transfer method: Squat pivot Chair/bed transfer assist level: Moderate assist (Pt 50 - 74%/lift or lower) Chair/bed transfer assistive device: Armrests Chair/bed transfer details: Verbal cues for precautions/safety, Tactile cues for placement, Tactile cues for sequencing,  Tactile  cues for weight shifting, Verbal cues for technique  Function - Locomotion: Wheelchair Will patient use wheelchair at discharge?:  (TBD) Type: Manual Max wheelchair distance: 50 Assist Level: Moderate assistance (Pt 50 - 74%) Assist Level: Moderate assistance (Pt 50 - 74%) Wheel 150 feet activity did not occur: Safety/medical concerns Turns around,maneuvers to table,bed, and toilet,negotiates 3% grade,maneuvers on rugs and over doorsills: No Function - Locomotion: Ambulation Ambulation activity did not occur: Safety/medical concerns Assistive device: Rail in hallway Max distance: 3' Assist level: 2 helpers (maxA for ambulation, +2 for w/c follow)  Function - Comprehension Comprehension: Auditory Comprehension assist level: Understands basic 90% of the time/cues < 10% of the time  Function - Expression Expression: Verbal Expression assist level: Expresses basic 90% of the time/requires cueing < 10% of the time.  Function - Social Interaction Social Interaction assist level: Interacts appropriately 75 - 89% of the time - Needs redirection for appropriate language or to initiate interaction.  Function - Problem Solving Problem solving assist level: Solves basic 25 - 49% of the time - needs direction more than half the time to initiate, plan or complete simple activities  Function - Memory Memory assist level: Recognizes or recalls 50 - 74% of the time/requires cueing 25 - 49% of the time Patient normally able to recall (first 3 days only): Current season, That he or she is in a hospital  Medical Problem List and Plan: 1.Left hemiplegia   secondary to right basal ganglia hemorrhage secondary to hypertensive crisis  Continue CIR PT, OT SLP 2.  DVT Prophylaxis/Anticoagulation: Subcutaneous heparin initiated 01/10/2016. Monitor for any bleeding episodes 3. Pain Management/headaches: Tylenol 3 as needed, Post stroke headaches start Topamax 25 twice a day May need to titrate  upwards- no headache complaints this morning, we'll start topical analgesic cream 4. Dysphagia. Dysphagia #1 nectar liquids. Follow-up speech therapy. 5. Neuropsych: This patient is capable of making decisions on his own behalf. 6. Skin/Wound Care: Routine skin checks, Start Keflex for left antecubital fossa  Cellulitis, superficial 7. Fluids/Electrolytes/Nutrition: Routine I&O's with follow-up chemistries changed to lactose-free diet this is helping with abdominal discomfort 8. Hypertension. Norvasc 10 mg daily, Lopressor 75 mg twice a day. Monitor with increased mobility 9. History of alcohol use. Provided counseling monitor for any signs of withdrawal, CMET showed mildly elevated AST but otherwise no abnormalities. According to his family his liver function tests were much higher at one time. 10. Hyperlipidemia. Pravachol 11. Mood. Zoloft 50 mg daily. Provide emotional support 12. Urinary tract infection will start Keflex pending culture results,   LOS (Days) 4 A FACE TO FACE EVALUATION WAS PERFORMED  Bensen Chadderdon E 01/18/2016, 5:00 PM

## 2016-01-18 NOTE — Progress Notes (Signed)
Occupational Therapy Session Note  Patient Details  Name: Jon [name] MRN: [id number] Date of Birth: 27-Feb-1956  Today's Date: 01/18/2016 OT Individual Time: 1105-1200 OT Individual Time Calculation (min): 55 min    Short Term Goals: Week 1:  OT Short Term Goal 1 (Week 1): Pt will complete squat pivot to toilet with mod assist of one caregiver OT Short Term Goal 2 (Week 1): Pt will complete transfer into tub/shower with tub bench and mod assist of one caregiver OT Short Term Goal 3 (Week 1): Pt will complete bathing with mod assist of one caregiver OT Short Term Goal 4 (Week 1): Pt will complete LB dressing with max assist of one caregiver OT Short Term Goal 5 (Week 1): Pt will scan to Lt visual field to locate items to complete self-care tasks with min cues and increased time  Skilled Therapeutic Interventions/Progress Updates:    Pt seen for skilled OT to facilitate attention to LUE and activity tolerance. Pt in room with wife stating he had to get back to bed immediately. Wife did state he had been sitting up for just one hour, pt stated it was 5 hours.  Pt's wife left and pt perseverating on getting into bed. After much negotiation with trying to get pt to stay in w/c for ADLs pt continued to insist he had to lay down. Transferred to bed max A to complete UB Bathing with dressing.  Max A self care  Bed level. Pt did assist with rolling and scooting up in bed. Worked with LUE but pt has no sensation in arm. Absent to touch and proprioception. Worked with pt self managing arm and attending to it. Pt did very well with L/R hand discrimination card.  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 Pt adjusted in bed with all needs met.  Bed alarm on.  Therapy Documentation Precautions:  Precautions Precautions: Fall Precaution Comments: L hemiparesis/flaccid Restrictions Weight Bearing Restrictions: No   Pain: pt stated he had a headache but did not  want medication   ADL:   see Function Navigator for Current Functional Status.   Therapy/Group: Individual Therapy  [employer] 01/18/2016, 12:49 PM

## 2016-01-18 NOTE — Progress Notes (Signed)
Physical Therapy Session Note  Patient Details  Name: Jon [name] MRN: [id number] Date of Birth: 01/21/1956  Today's Date: 01/18/2016 PT Individual Time: 1415-1500 PT Individual Time Calculation (min): 45 min   Short Term Goals: Week 1:  PT Short Term Goal 1 (Week 1): Pt will perform squat pivot transfer w/c <>bed with modA PT Short Term Goal 2 (Week 1): Pt will perform sit <>stand modA PT Short Term Goal 3 (Week 1): Pt will perform w/c propulsion minA with R hemi technique PT Short Term Goal 4 (Week 1): Pt will initiate gait training  Skilled Therapeutic Interventions/Progress Updates:    Pt received supine in bed, denies pain and agreeable to treatment with max encouragement and physical cues to initiate activity. Squat pivot transfer bed>w/c with modA. Sit <>stand with RUE on rail and modA x5 trials. Gait x3' with maxA to progress LLE and maintain stance control on L side. Note increased flexor tone in LLE and no palpable activation of L musculature to support body weight. W/c propulsion with RUE and modA to maintain straight trajectory as pt unwilling to A with RLE to steer. Remained seated in w/c at nurses station with quick release belt intact at completion of session.   Therapy Documentation Precautions:  Precautions Precautions: Fall Precaution Comments: L hemiparesis/flaccid Restrictions Weight Bearing Restrictions: No Pain: Pain Assessment Pain Assessment: No/denies pain Pain Score: 3    See Function Navigator for Current Functional Status.   Therapy/Group: Individual Therapy  [name] 01/18/2016, 3:41 PM

## 2016-01-18 NOTE — Progress Notes (Signed)
Speech Language Pathology Daily Session Note  Patient Details  Name: Jon [name] MRN: [id number] Date of Birth: 03/04/1956  Today's Date: 01/18/2016 SLP Individual Time: [id number] SLP Individual Time Calculation (min): 30 min  Short Term Goals: Week 1: SLP Short Term Goal 1 (Week 1): Pt will sustain his attention to basic tasks for 1-2 minute intervals with mod assist verbal cues for redirection.  SLP Short Term Goal 2 (Week 1): Pt will verbalize at least 2 deficits s/p CVA with mod assist question cues.   SLP Short Term Goal 3 (Week 1): Pt will be intelligible at the conversational level with min assist verbal cues for overarticulation, increased vocal intensity and slow rate of speech.   SLP Short Term Goal 4 (Week 1): Pt will consume dys 3 textures and thin liquids with min assist verbal cues for use of swallowing precautions.   SLP Short Term Goal 5 (Week 1): Pt will visually scan to the left of midline during basic, famliar tasks with max assist multimodal cues in 50% of opportunities.    Skilled Therapeutic Interventions: Pt upright in WC, required encouragement to participate with therapy. Pt frequently attempted to distract from therapeutic activities and required mod A verbal cueing to attend to task. Pt O x 4 generally, but required clarifying questions and access to a calendar for specific date. Pt able to provide list of previous professions with min A for completeness (initially left off current job which he has been doing for 20+ years). Pt was able to recall 4 healthcare providers by name that he worked with today at mod I. Pt was also able to recall this therapist and a portion of the therapy content from session on Saturday.    Function:  Eating Eating   Modified Consistency Diet: Yes Eating Assist Level: Supervision or verbal cues;More than reasonable amount of time;Set up assist for   Eating Set Up Assist For: Opening containers       Cognition Comprehension  Comprehension assist level: Understands basic 90% of the time/cues < 10% of the time  Expression   Expression assist level: Expresses basic 90% of the time/requires cueing < 10% of the time.  Social Interaction Social Interaction assist level: Interacts appropriately 75 - 89% of the time - Needs redirection for appropriate language or to initiate interaction.  Problem Solving Problem solving assist level: Solves basic 25 - 49% of the time - needs direction more than half the time to initiate, plan or complete simple activities  Memory Memory assist level: Recognizes or recalls 50 - 74% of the time/requires cueing 25 - 49% of the time    Pain Pain Assessment Pain Assessment: No/denies pain  Therapy/Group: Individual Therapy  Rocky CraftsKara E Tiombe Tomeo MA, CCC-SLP 01/18/2016, 3:52 PM

## 2016-01-18 NOTE — Progress Notes (Signed)
Speech Language Pathology Daily Session Note  Patient Details  Name: Jon [name] MRN: [id number] Date of Birth: 1956-02-27  Today's Date: 01/18/2016 SLP Individual Time: 0930-1030 SLP Individual Time Calculation (min): 60 min  Short Term Goals: Week 1: SLP Short Term Goal 1 (Week 1): Pt will sustain his attention to basic tasks for 1-2 minute intervals with mod assist verbal cues for redirection.  SLP Short Term Goal 2 (Week 1): Pt will verbalize at least 2 deficits s/p CVA with mod assist question cues.   SLP Short Term Goal 3 (Week 1): Pt will be intelligible at the conversational level with min assist verbal cues for overarticulation, increased vocal intensity and slow rate of speech.   SLP Short Term Goal 4 (Week 1): Pt will consume dys 3 textures and thin liquids with min assist verbal cues for use of swallowing precautions.   SLP Short Term Goal 5 (Week 1): Pt will visually scan to the left of midline during basic, famliar tasks with max assist multimodal cues in 50% of opportunities.    Skilled Therapeutic Interventions:  Pt was seen for skilled ST targeting goals for cognition and dysphagia.  Therapist facilitated the session with mod assist verbal cues for redirection to self feeding of breakfast meal due to verbosity and impulsivity. Pt also benefited from mod assist verbal cues for task initiation during meal.  Pt with right gaze preference throughout today's therapy session but was able to find items deliberately placed to the left of midline on meal tray.  Mod assist verbal cues needed to make eye contact with therapist when seated on pt's left.  Pt consumed dys 3 textures and thin liquids without overt s/s of aspiration.  Max assist multimodal cues needed to monitor and correct left sided anterior labial loss of boluses.  Minimal residuals remained in the oral cavity post swallow which cleared with liquid wash.  Pt's wife was present during today's therapy session and was educated  regarding current goals and progress in therapies.  SLP also discussed distraction management techniques to maximize pt's attention to tasks.  Pt left in wheelchair with wife at bedside and quick release belt donned for safety.  Continue per current plan of care.    Function:  Eating Eating   Modified Consistency Diet: Yes Eating Assist Level: Supervision or verbal cues;More than reasonable amount of time;Set up assist for   Eating Set Up Assist For: Opening containers       Cognition Comprehension Comprehension assist level: Understands basic 90% of the time/cues < 10% of the time  Expression   Expression assist level: Expresses basic 75 - 89% of the time/requires cueing 10 - 24% of the time. Needs helper to occlude trach/needs to repeat words.  Social Interaction Social Interaction assist level: Interacts appropriately 50 - 74% of the time - May be physically or verbally inappropriate.  Problem Solving Problem solving assist level: Solves basic 25 - 49% of the time - needs direction more than half the time to initiate, plan or complete simple activities  Memory Memory assist level: Recognizes or recalls 25 - 49% of the time/requires cueing 50 - 75% of the time    Pain Pain Assessment Pain Assessment: No/denies pain  Therapy/Group: Individual Therapy  Tamecca Artiga, Melanee SpryNicole L 01/18/2016, 1:01 PM

## 2016-01-19 ENCOUNTER — Inpatient Hospital Stay (HOSPITAL_COMMUNITY): Payer: PRIVATE HEALTH INSURANCE | Admitting: Occupational Therapy

## 2016-01-19 ENCOUNTER — Inpatient Hospital Stay (HOSPITAL_COMMUNITY): Payer: PRIVATE HEALTH INSURANCE | Admitting: Physical Therapy

## 2016-01-19 ENCOUNTER — Inpatient Hospital Stay (HOSPITAL_COMMUNITY): Payer: PRIVATE HEALTH INSURANCE | Admitting: Speech Pathology

## 2016-01-19 NOTE — Progress Notes (Signed)
Speech Language Pathology Daily Session Note  Patient Details  Name: Jon [name] MRN: [id number] Date of Birth: [birth year]  Today's Date: 01/19/2016 SLP Individual Time: 1345-1430 SLP Individual Time Calculation (min): 45 min  Short Term Goals: Week 1: SLP Short Term Goal 1 (Week 1): Pt will sustain his attention to basic tasks for 1-2 minute intervals with mod assist verbal cues for redirection.  SLP Short Term Goal 2 (Week 1): Pt will verbalize at least 2 deficits s/p CVA with mod assist question cues.   SLP Short Term Goal 3 (Week 1): Pt will be intelligible at the conversational level with min assist verbal cues for overarticulation, increased vocal intensity and slow rate of speech.   SLP Short Term Goal 4 (Week 1): Pt will consume dys 3 textures and thin liquids with min assist verbal cues for use of swallowing precautions.   SLP Short Term Goal 5 (Week 1): Pt will visually scan to the left of midline during basic, famliar tasks with max assist multimodal cues in 50% of opportunities.    Skilled Therapeutic Interventions: Pt upright in bed, spouse present. Pt required encouragement and direct requests to participate with therapy. Pt frequently attempted to distract from therapeutic activities and required mod A verbal cueing to attend to task. Pt O x 4 generally. Mod A for visual scanning for paragraph level reading. Mod A for moderate level reasoning.   Function:  Eating Eating                 Cognition Comprehension Comprehension assist level: Follows basic conversation/direction with extra time/assistive device  Expression   Expression assist level: Expresses basic 75 - 89% of the time/requires cueing 10 - 24% of the time. Needs helper to occlude trach/needs to repeat words.  Social Interaction Social Interaction assist level: Interacts appropriately 75 - 89% of the time - Needs redirection for appropriate language or to initiate interaction.  Problem Solving Problem  solving assist level: Solves basic 25 - 49% of the time - needs direction more than half the time to initiate, plan or complete simple activities  Memory Memory assist level: Recognizes or recalls 25 - 49% of the time/requires cueing 50 - 75% of the time    Pain Pain Assessment Pain Assessment: No/denies pain  Therapy/Group: Individual Therapy  Rocky CraftsKara E Jamira Barfuss MA, CCC-SLP 01/19/2016, 4:11 PM

## 2016-01-19 NOTE — Progress Notes (Signed)
Speech Language Pathology Daily Session Note  Patient Details  Name: Jon [name] D Hausmann MRN: [id number] Date of Birth: 06/09/1956  Today's Date: 01/19/2016 SLP Individual Time: 1015-1100 SLP Individual Time Calculation (min): 45 min  Short Term Goals: Week 1: SLP Short Term Goal 1 (Week 1): Pt will sustain his attention to basic tasks for 1-2 minute intervals with mod assist verbal cues for redirection.  SLP Short Term Goal 2 (Week 1): Pt will verbalize at least 2 deficits s/p CVA with mod assist question cues.   SLP Short Term Goal 3 (Week 1): Pt will be intelligible at the conversational level with min assist verbal cues for overarticulation, increased vocal intensity and slow rate of speech.   SLP Short Term Goal 4 (Week 1): Pt will consume dys 3 textures and thin liquids with min assist verbal cues for use of swallowing precautions.   SLP Short Term Goal 5 (Week 1): Pt will visually scan to the left of midline during basic, famliar tasks with max assist multimodal cues in 50% of opportunities.    Skilled Therapeutic Interventions:  Pt was seen for skilled ST targeting cognitive goals.  Pt required mod encouragement to participate in ST.  Therapist facilitated the session with a basic sorting task targeting visual scanning to the left of midline and sustained attention to task.  Pt required min assist verbal cues to attend to items on the left.  Pt was able to sustain his attention to task for ~30 second interval with mod assist verbal cues for redirection to task.  Pt was also able to organize dates onto a wall calendar in sequential order with min assist verbal cues to attend to the left.  Pt was able to be redirected to task after becoming internally distracted with mod assist verbal cues.  Max assist verbal cues needed for topic maintenance.  Pt was returned to room and left with call bell within reach and quick release belt donned.  Wife at bedside.  Pt returned demonstration of use of call bell  with mod assist verbal cues.  Continue per current plan of care.    Function:  Eating Eating                 Cognition Comprehension Comprehension assist level: Follows basic conversation/direction with extra time/assistive device  Expression   Expression assist level: Expresses basic 75 - 89% of the time/requires cueing 10 - 24% of the time. Needs helper to occlude trach/needs to repeat words.  Social Interaction Social Interaction assist level: Interacts appropriately 50 - 74% of the time - May be physically or verbally inappropriate.  Problem Solving Problem solving assist level: Solves basic 25 - 49% of the time - needs direction more than half the time to initiate, plan or complete simple activities  Memory Memory assist level: Recognizes or recalls 25 - 49% of the time/requires cueing 50 - 75% of the time    Pain Pain Assessment Pain Assessment: No/denies pain  Therapy/Group: Individual Therapy  Lorelei Heikkila, Melanee SpryNicole L 01/19/2016, 3:57 PM

## 2016-01-19 NOTE — Progress Notes (Signed)
Subjective/Complaints: Patient states that he did not receive any topical analgesic yesterday. Continues to have low back pain. No other complaints. Eating a little bit better. We discussed liver function tests, last one was performed several days ago and was essentially normal.  Review of systems denies chest pain shortness of breath nausea vomiting diarrhea or constipation, tired all the time  Objective: Vital Signs: Blood pressure 126/70, pulse 70, temperature 98.5 F (36.9 C), temperature source Oral, resp. rate 18, SpO2 98 %. No results found. No results found for this or any previous visit (from the past 72 hour(s)).  HEENT: normal Cardio: RRR Resp: CTA B/L and unlabored GI: BS positive and mild epigastric distention and tenderness Extremity:  Pulses positive and No Edema Skin:   Other left antecubital fossa with erythema at  prior IV site,, mild  macular rash Neuro: Confused, Cranial Nerve Abnormalities left central 7, Abnormal Sensory Decreased sensation left upper and left lower limb, Abnormal Motor 0/5 left upper extremity 0/5 left lower extremity 5/5 right upper tremor E5/5  right lower extremity, Tone:  Increased tone  in the left elbow flexor as well as left knee extensor Ashworth grade 3 in the lower limb and 2 in the upper limb and Other affect is inappropriate Musc/Skel:  Other mild pain with  elbow and knee range of motion on the left side pain is in the biceps  and  quad muscle respectively. Not in the joint Gen. no acute distress   Assessment/Plan: 1. Functional deficits secondary to right hemiparesis causing left hemiplegia which require 3+ hours per day of interdisciplinary therapy in a comprehensive inpatient rehab setting. Physiatrist is providing close team supervision and 24 hour management of active medical problems listed below. Physiatrist and rehab team continue to assess barriers to discharge/monitor patient progress toward functional and medical  goals. FIM: Function - Bathing Position: Bed (pt too fatigued to sit up) Body parts bathed by patient: Chest, Abdomen, Left arm Body parts bathed by helper: Right arm Bathing not applicable: Front perineal area, Buttocks, Right upper leg, Left upper leg, Right lower leg, Left lower leg (pt declined) Assist Level: 2 helpers (Max assist, 2 helpers for buttocks in standing)  Function- Upper Body Dressing/Undressing Upper body dressing/undressing activity did not occur: Refused What is the patient wearing?: Pull over shirt/dress Pull over shirt/dress - Perfomed by patient: Thread/unthread right sleeve, Put head through opening Pull over shirt/dress - Perfomed by helper: Thread/unthread left sleeve, Pull shirt over trunk Assist Level:  (Total assist) Function - Lower Body Dressing/Undressing What is the patient wearing?: Pants Position: Bed Pants- Performed by helper: Thread/unthread right pants leg, Thread/unthread left pants leg, Pull pants up/down Non-skid slipper socks- Performed by helper: Don/doff right sock, Don/doff left sock Assist for lower body dressing: 2 Helpers  Function - Toileting Toileting activity did not occur: No continent bowel/bladder event Toileting steps completed by helper: Adjust clothing prior to toileting, Performs perineal hygiene, Adjust clothing after toileting Toileting Assistive Devices: Grab bar or rail Assist level: Two helpers  Function - ArchivistToilet Transfers Toilet transfer activity did not occur: Safety/medical concerns Toilet transfer assistive device: Grab bar Assist level to toilet: Maximal assist (Pt 25 - 49%/lift and lower) Assist level from toilet: 2 helpers  Function - Chair/bed transfer Chair/bed transfer method: Squat pivot Chair/bed transfer assist level: Moderate assist (Pt 50 - 74%/lift or lower) Chair/bed transfer assistive device: Armrests Chair/bed transfer details: Verbal cues for precautions/safety, Tactile cues for placement,  Tactile cues for sequencing, Tactile cues  for weight shifting, Verbal cues for technique  Function - Locomotion: Wheelchair Will patient use wheelchair at discharge?:  (TBD) Type: Manual Max wheelchair distance: 50 Assist Level: Touching or steadying assistance (Pt > 75%) Assist Level: Touching or steadying assistance (Pt > 75%) Wheel 150 feet activity did not occur: Safety/medical concerns Turns around,maneuvers to table,bed, and toilet,negotiates 3% grade,maneuvers on rugs and over doorsills: No Function - Locomotion: Ambulation Ambulation activity did not occur: Safety/medical concerns Assistive device: Rail in hallway Max distance: 3' Assist level: 2 helpers (maxA for ambulation, +2 for w/c follow)  Function - Comprehension Comprehension: Auditory Comprehension assist level: Follows basic conversation/direction with extra time/assistive device  Function - Expression Expression: Verbal Expression assist level: Expresses basic 75 - 89% of the time/requires cueing 10 - 24% of the time. Needs helper to occlude trach/needs to repeat words.  Function - Social Interaction Social Interaction assist level: Interacts appropriately 75 - 89% of the time - Needs redirection for appropriate language or to initiate interaction.  Function - Problem Solving Problem solving assist level: Solves basic 25 - 49% of the time - needs direction more than half the time to initiate, plan or complete simple activities  Function - Memory Memory assist level: Recognizes or recalls 25 - 49% of the time/requires cueing 50 - 75% of the time Patient normally able to recall (first 3 days only): Current season, That he or she is in a hospital  Medical Problem List and Plan: 1.Left hemiplegia   secondary to right basal ganglia hemorrhage secondary to hypertensive crisis  Continue CIR PT, OT SLP, team conference in a.m. 2.  DVT Prophylaxis/Anticoagulation: Subcutaneous heparin initiated 01/10/2016. Monitor for  any bleeding episodes 3. Pain Management/headaches: Tylenol 3 as needed, Post stroke headaches start Topamax 25 twice a day May need to titrate upwards- no headache complaints this morning, we'll start topical analgesic cream 4. Dysphagia. Dysphagia #1 nectar liquids. Follow-up speech therapy. 5. Neuropsych: This patient is capable of making decisions on his own behalf. 6. Skin/Wound Care: Routine skin checks, Start Keflex for left antecubital fossa  Cellulitis, superficial 7. Fluids/Electrolytes/Nutrition: Routine I&O's with follow-up chemistries changed to lactose-free diet this is helping with abdominal discomfort 8. Hypertension. Norvasc 10 mg daily, Lopressor 75 mg twice a day. Monitor with increased mobility 9. History of alcohol use. Provided counseling monitor for any signs of withdrawal, CMET showed mildly elevated AST but otherwise no abnormalities. Discussed results with his wife.10. Hyperlipidemia. Pravachol 11. Mood. Zoloft 50 mg daily. Provide emotional support 12. Urinary tract infection will start Keflex UA positive, culture not sent, will finish seven-day treatment  LOS (Days) 5 A FACE TO FACE EVALUATION WAS PERFORMED  Jon [name] 01/19/2016, 5:21 PM

## 2016-01-19 NOTE — Progress Notes (Signed)
Occupational Therapy Session Note  Patient Details  Name: Jon [name] MRN: [id number] Date of Birth: Sep 30, 1955  Today's Date: 01/19/2016 OT Individual Time: 1435-1520 OT Individual Time Calculation (min): 45 min   Short Term Goals: Week 1:  OT Short Term Goal 1 (Week 1): Pt will complete squat pivot to toilet with mod assist of one caregiver OT Short Term Goal 2 (Week 1): Pt will complete transfer into tub/shower with tub bench and mod assist of one caregiver OT Short Term Goal 3 (Week 1): Pt will complete bathing with mod assist of one caregiver OT Short Term Goal 4 (Week 1): Pt will complete LB dressing with max assist of one caregiver OT Short Term Goal 5 (Week 1): Pt will scan to Lt visual field to locate items to complete self-care tasks with min cues and increased time  Skilled Therapeutic Interventions/Progress Updates:  Patient found supine in bed with wife and father present. Father left, wife stayed. Pt with no complaints of pain. Pt required mod encouragement to participate in therapy. Pt max assist +2 for rolling left to/from right to pull pants up towards waist. Pt also required max assist +2 for supine to sit. Pt total assist +2 for scoot pivot transfer EOB to w/c (towards left side). Pt sat in w/c at sink for grooming task of brushing teeth, pt refused bathing and dressing. When therapist would ask pt to engage and work on left attention pt with increased frustration and at one point threw his toothpaste towards the pink pan and it landed in the trash can. Pt stated "no" to questions or commands therapist would ask and therapist took opportunity to talk with patient about importance of trying and not constantly stating "no". Pt with request to use BR. Therapist +[facility] and assisted pt to elevated toilet seat in BR. Due to time, notified RN and NT and RN present to supervise pt on toilet. NT to assist with +2 transfer using STEDY off toilet once patient ready.  Discussed with wife that therapy will start incorporating her in sessions as she seems intimidated and scared to help patient with bed level tasks at this time.    Therapy Documentation Precautions:  Precautions Precautions: Fall Precaution Comments: L hemiparesis/flaccid Restrictions Weight Bearing Restrictions: No  Vital Signs: Therapy Vitals Temp: 98.3 F (36.8 C) Temp Source: Oral Pulse Rate: 63 Resp: 18 BP: 134/68 mmHg Patient Position (if appropriate): Lying Oxygen Therapy SpO2: 99 % O2 Device: Not Delivered  See Function Navigator for Current Functional Status.  Therapy/Group: Individual Therapy  Edwin CapPatricia Dorrine Montone , MS, OTR/L, CLT  01/19/2016, 3:26 PM

## 2016-01-19 NOTE — Progress Notes (Signed)
Physical Therapy Session Note  Patient Details  Name: Jon [name] MRN: [id number] Date of Birth: 1955-11-29  Today's Date: 01/19/2016 PT Individual Time: 7829-56210845-0930 PT Individual Time Calculation (min): 45 min   Short Term Goals: Week 1:  PT Short Term Goal 1 (Week 1): Pt will perform squat pivot transfer w/c <>bed with modA PT Short Term Goal 2 (Week 1): Pt will perform sit <>stand modA PT Short Term Goal 3 (Week 1): Pt will perform w/c propulsion minA with R hemi technique PT Short Term Goal 4 (Week 1): Pt will initiate gait training  Skilled Therapeutic Interventions/Progress Updates:    Pt received supine in bed, denies pain and agreeable to treatment with max encouragement from therapist and wife. Educated pt in role of therapy, importance of participation to improve functional mobility and allow for pt to prepare for d/c home. Supine>sit with maxA to faciltiate initiation of activity. Squat pivot transfer to R side modA. W/c propulsion x50' with hand over foot to educate pt in RLE propulsion and navigation. Sit <> stand x2 during session to add/remove cushion to improve w/c propulsion; minA with rail in hall. Car transfer with maxA into car to L side, modA out of car to L side. Returned to room and remained seated in w/c with wife present and all needs in reach at completion of session.   Therapy Documentation Precautions:  Precautions Precautions: Fall Precaution Comments: L hemiparesis/flaccid Restrictions Weight Bearing Restrictions: No Pain: Pain Assessment Pain Assessment: 0-10 Pain Score: 1  Pain Location: Back Pain Orientation: Lower Pain Intervention(s): Medication (See eMAR)   See Function Navigator for Current Functional Status.   Therapy/Group: Individual Therapy  [name] 01/19/2016, 10:35 AM

## 2016-01-19 NOTE — Progress Notes (Signed)
Social Work Patient ID: Jon [name], male   DOB: 10/27/1955, 60 y.o.   MRN: [id number] Met with pt's wife who had questions regarding financial resources. Discussed disability application and ability to apply on-line or to go to the local office. Wife has been through this before with herself and her daughter, so she will start the process. She will be here tomorrow and contact the local office for a appointment. She is trying to be here in the am to see him in therapies and begin learning his care.

## 2016-01-20 ENCOUNTER — Inpatient Hospital Stay (HOSPITAL_COMMUNITY): Payer: PRIVATE HEALTH INSURANCE | Admitting: Speech Pathology

## 2016-01-20 ENCOUNTER — Inpatient Hospital Stay (HOSPITAL_COMMUNITY): Payer: PRIVATE HEALTH INSURANCE | Admitting: Physical Therapy

## 2016-01-20 ENCOUNTER — Inpatient Hospital Stay (HOSPITAL_COMMUNITY): Payer: PRIVATE HEALTH INSURANCE | Admitting: Occupational Therapy

## 2016-01-20 MED ORDER — MEGESTROL ACETATE 400 MG/10ML PO SUSP
400.0000 mg | Freq: Two times a day (BID) | ORAL | Status: DC
  Administered 2016-01-20 – 2016-02-01 (×26): 400 mg via ORAL
  Filled 2016-01-20 (×27): qty 10

## 2016-01-20 NOTE — Patient Care Conference (Signed)
Inpatient RehabilitationTeam Conference and Plan of Care Update Date: 01/20/2016   Time: 11:20 AM    Patient Name: Jon [name]      Medical Record Number: [id number]  Date of Birth: 1956-05-23 Sex: Male         Room/Bed: 4W06C/4W06C-01 Payor Info: Payor: [employer] / [place] / Product Type: *No Product type* /    Admitting Diagnosis: CVA  Admit Date/Time:  01/14/2016  3:41 PM Admission Comments: No comment available   Primary Diagnosis:  Hemorrhagic stroke (HCC) Principal Problem: Hemorrhagic stroke [facility](HCC)  Patient Active Problem List   Diagnosis Date Noted  . Hyperlipidemia 01/14/2016  . Depression 01/14/2016  . Obesity 01/14/2016  . Hyperglycemia 01/14/2016  . Urinary tract infection, site not specified 01/14/2016  . Basal ganglia hemorrhage (HCC) 01/14/2016  . Hemiparesis affecting nondominant side as late effect of cerebrovascular accident (HCC)   . Cognitive deficit, post-stroke   . Left hemiparesis (HCC)   . Essential hypertension   . Dysphagia, post-stroke   . Gait disturbance, post-stroke   . Hyponatremia   . Thrombocytopenia (HCC)   . Hemorrhagic stroke (HCC) R basal ganglia d/t HTN 01/08/2016    Expected Discharge Date: Expected Discharge Date: 02/04/16  Team Members Present: Physician leading conference: Dr. Claudette LawsAndrew Kirsteins Social Worker Present: Dossie DerBecky Lenee Franze, LCSW Nurse Present: Kennon PortelaJeanna Hicks, RN PT Present: Alyson ReedyElizabeth Tygielski, PT OT Present: Roney MansJennifer Smith, OT SLP Present: Jackalyn LombardNicole Page, SLP PPS Coordinator present : Tora DuckMarie Noel, RN, CRRN     Current Status/Progress Goal Weekly Team Focus  Medical   Poor intake, severe cognitive deficits related to hemorrhagic infarct, history of alcoholism  Maintain medical stability during rehabilitation stay to ensure home discharge  Improve oral intake   Bowel/Bladder     Condom cath-treating UTI Much assist with toileting   Cont B & B   Work on timed toileting with DC condom cath  Swallow/Nutrition/  Hydration   Dys 3, thin liquids; full supervision due to cognitive impairment  supervision   use of safe swallowing strategies    ADL's   max A UB self care, total A LB self care, max A squat pivot transfer, severe L neglect, LUE flaccid with absent sensation  min A balance, bathroom transfers,UB dressing; mod A LB dressing, toileting  ADL retraining, pt education, LUE NMR, trunk control, balance, visual scanning   Mobility   mod/max squat pivot transfers, min/mod sit <>stand, max/total gait. Limited by participation/initiation  S bed mobility, minA transfers, modA gait controlled environment, S w/c propulsion  particiation/initiation in session, transfers, sitting/standing balance and gait training    Communication   Mild dysarthria  supervision   education and carryover of compensatory strategies pending improved mentation    Safety/Cognition/ Behavioral Observations  moderately severe cognitive impairment   min assist   basic cognitiion: attention, awareness, visual scanning to the left    Pain        less than 3 no complaints of pain     Skin        Monitor skin-currently no skin issues        *See Care Plan and progress notes for long and short-term goals.  Barriers to Discharge: Still at heavy assistance level, Poor awareness of deficits    Possible Resolutions to Barriers:  Continue rehabilitation    Discharge Planning/Teaching Needs:  Home with wife who is trying to be here each morning to attend therapies with husband so can see his progress and learn his care.  Team Discussion:  Goals min/mod level of assist. Currently is max assist level. Dys 3 poor po intake due to cognition. Wearing condom cath due to UTI and max assist to toilet at this time. MD starting megace due to poor intake feels needs several small meals throughout the day. Poor awareness of deficits and attention issues.  Revisions to Treatment Plan:  None   Continued Need for Acute Rehabilitation Level  of Care: The patient requires daily medical management by a physician with specialized training in physical medicine and rehabilitation for the following conditions: Daily direction of a multidisciplinary physical rehabilitation program to ensure safe treatment while eliciting the highest outcome that is of practical value to the patient.: Yes Daily medical management of patient stability for increased activity during participation in an intensive rehabilitation regime.: Yes Daily analysis of laboratory values and/or radiology reports with any subsequent need for medication adjustment of medical intervention for : Neurological problems;Nutritional problems  Stefanos Haynesworth, Lemar LivingsRebecca G 01/20/2016, 3:30 PM

## 2016-01-20 NOTE — Progress Notes (Signed)
Occupational Therapy Session Note  Patient Details  Name: Jon [name] MRN: [id number] Date of Birth: 1956/07/16  Today's Date: 01/20/2016 OT Individual Time: 1030-1130 OT Individual Time Calculation (min): 60 min    Short Term Goals: Week 1:  OT Short Term Goal 1 (Week 1): Pt will complete squat pivot to toilet with mod assist of one caregiver OT Short Term Goal 2 (Week 1): Pt will complete transfer into tub/shower with tub bench and mod assist of one caregiver OT Short Term Goal 3 (Week 1): Pt will complete bathing with mod assist of one caregiver OT Short Term Goal 4 (Week 1): Pt will complete LB dressing with max assist of one caregiver OT Short Term Goal 5 (Week 1): Pt will scan to Lt visual field to locate items to complete self-care tasks with min cues and increased time  Skilled Therapeutic Interventions/Progress Updates:    Pt seen for skilled OT to facilitate L side awareness and L visual scanning, trunk control, activity tolerance. Spouse present for education. Pt c/o tight pants, so pants changed out with total A with use of Stedy lift. Pt taken to gym. W/c ><mat squat pivot with max A demonstrating improved lean and forward wt shift.  Pt worked on L scanning to retrieve cones from spouse with LUE wt bearing. Pt needed continual cues to sit upright and to scan to left.  Pt very fatigued, constantly asking to go to bed. Max encouragement to keep participating. Worked on wt shifts. Pt maintained static sitting with S and dynamic with min A.  With self ROM, max cues to maintain R hand to grasp L hand.  UE ranger with L arm for 2 min with slight increase in tone.  Pt taken to his next therapy session.  Therapy Documentation Precautions:  Precautions Precautions: Fall Precaution Comments: L hemiparesis/flaccid Restrictions Weight Bearing Restrictions: No   Pain: Pain Assessment Pain Assessment: No/denies pain ADL:   See Function Navigator for Current Functional  Status.   Therapy/Group: Individual Therapy  Heyward Douthit 01/20/2016, 12:32 PM

## 2016-01-20 NOTE — Progress Notes (Signed)
Physical Therapy Session Note  Patient Details  Name: Jon [name] D Rainford MRN: [id number] Date of Birth: 1956/03/26  Today's Date: 01/20/2016 PT Individual Time: 0900-1000 PT Individual Time Calculation (min): 60 min   Short Term Goals: Week 1:  PT Short Term Goal 1 (Week 1): Pt will perform squat pivot transfer w/c <>bed with modA PT Short Term Goal 2 (Week 1): Pt will perform sit <>stand modA PT Short Term Goal 3 (Week 1): Pt will perform w/c propulsion minA with R hemi technique PT Short Term Goal 4 (Week 1): Pt will initiate gait training  Skilled Therapeutic Interventions/Progress Updates:    Pt received supine in bed, c/o bladder pain "like I gotta pee", however per NT he was recently scanned with minimal amounts of urine in bladder. Agreeable to treatment with minimal encouragement, much improved over previous sessions. Supine>sit modA with mod cues for hand placement and sequencing. Lateral scoot to L side with modA. Seated in w/c, changes shirt with maxA. Requests to use restroom and have a bowel movement. Transfer w/c <>toilet with Stedy and +1 A. Sit <>stand minA with L lateral lean, and lean increases with time after sitting on toilet and fatigued. TotalA for hygiene and changing brief. Gait x10', x12' with R rail in hall and maxA for LLE progression and stance control; w/c follow for safety. Educated wife on pt initiation impairments with improved participation when cued physically and demonstrate how to start task. Several times throughout session when pt asked to perform a task, pt responds "no", but when hand placed in correct position to assist with task, but automatically performs task. Pt remained seated in w/c with quick release belt intact and all needs within reach at completion of session, wife present.   Therapy Documentation Precautions:  Precautions Precautions: Fall Precaution Comments: L hemiparesis/flaccid Restrictions Weight Bearing Restrictions: No Pain: Pain  Assessment Pain Assessment: No/denies pain   See Function Navigator for Current Functional Status.   Therapy/Group: Individual Therapy  [name] 01/20/2016, 11:45 AM

## 2016-01-20 NOTE — Progress Notes (Signed)
Speech Language Pathology Daily Session Note  Patient Details  Name: Jon [name] MRN: [id number] Date of Birth: June 16, 1956  Today's Date: 01/20/2016 SLP Individual Time: 1330-1415 SLP Individual Time Calculation (min): 45 min  Short Term Goals: Week 1: SLP Short Term Goal 1 (Week 1): Pt will sustain his attention to basic tasks for 1-2 minute intervals with mod assist verbal cues for redirection.  SLP Short Term Goal 2 (Week 1): Pt will verbalize at least 2 deficits s/p CVA with mod assist question cues.   SLP Short Term Goal 3 (Week 1): Pt will be intelligible at the conversational level with min assist verbal cues for overarticulation, increased vocal intensity and slow rate of speech.   SLP Short Term Goal 4 (Week 1): Pt will consume dys 3 textures and thin liquids with min assist verbal cues for use of swallowing precautions.   SLP Short Term Goal 5 (Week 1): Pt will visually scan to the left of midline during basic, famliar tasks with max assist multimodal cues in 50% of opportunities.    Skilled Therapeutic Interventions: Skilled treatment session focused on dysphagia and cognition goals. Pt initially asleep when ST entered room and wife provided that pt was fatigued from tx sessions during morning. Pt easily awake and required mod to max verbal encouragement to participate in session. SLP facilitated session by providing skilled observation of dysphagia 3 lunch tray with thin liquids. Pt without s/s of aspiration and no oral residue noted. With Max A verbal cues, pt consumed 50% of lunch. Pt with frequent refusals of task but was able to redirected with Max A verbal encouragement. Pt was intelligible at the simple conversation level when discussing his favorite Psychologist, clinicalguitar player. Pt able to sustain his attention to basic task for performing a search for the guitar player (on ST's mobile device) and sustained attention for 3 minute intervals with supervision cues when discussing and navigating  information related to favorite player. Pt left in bed with wife present and all needs within reach. Continue current plan of care.   Function:  Eating Eating   Modified Consistency Diet: Yes Eating Assist Level: More than reasonable amount of time;Set up assist for;Supervision or verbal cues;Helper checks for pocketed food;Helper feeds patient   Eating Set Up Assist For: Opening containers Helper Scoops Food on Utensil: Every scoop Helper Brings Food to Mouth: Every scoop   Cognition Comprehension Comprehension assist level: Understands basic 75 - 89% of the time/ requires cueing 10 - 24% of the time  Expression   Expression assist level: Expresses basic 75 - 89% of the time/requires cueing 10 - 24% of the time. Needs helper to occlude trach/needs to repeat words.  Social Interaction Social Interaction assist level: Interacts appropriately 75 - 89% of the time - Needs redirection for appropriate language or to initiate interaction.  Problem Solving Problem solving assist level: Solves basic 25 - 49% of the time - needs direction more than half the time to initiate, plan or complete simple activities  Memory Memory assist level: Recognizes or recalls 50 - 74% of the time/requires cueing 25 - 49% of the time    Pain Pain Assessment Pain Assessment: No/denies pain  Therapy/Group: Individual Therapy  Alison Breeding 01/20/2016, 3:47 PM

## 2016-01-20 NOTE — Progress Notes (Signed)
Speech Language Pathology Daily Session Note  Patient Details  Name: Jon [name] MRN: [id number] Date of Birth: 1956-03-11  Today's Date: 01/20/2016 SLP Individual Time: 1130-1200 SLP Individual Time Calculation (min): 30 min  Short Term Goals: Week 1: SLP Short Term Goal 1 (Week 1): Pt will sustain his attention to basic tasks for 1-2 minute intervals with mod assist verbal cues for redirection.  SLP Short Term Goal 2 (Week 1): Pt will verbalize at least 2 deficits s/p CVA with mod assist question cues.   SLP Short Term Goal 3 (Week 1): Pt will be intelligible at the conversational level with min assist verbal cues for overarticulation, increased vocal intensity and slow rate of speech.   SLP Short Term Goal 4 (Week 1): Pt will consume dys 3 textures and thin liquids with min assist verbal cues for use of swallowing precautions.   SLP Short Term Goal 5 (Week 1): Pt will visually scan to the left of midline during basic, famliar tasks with max assist multimodal cues in 50% of opportunities.    Skilled Therapeutic Interventions:  Pt was seen for skilled ST targeting cognitive goals.  SLP facilitated the session with basic money management tasks.  Pt was able to sort coins into groups by value with min assist verbal cues to sustain attention to task.  Pt generated values of coins when named with mod assist verbal cues for working memory, sustained attention, and organization; max assist verbal cues needed to count money and generate total.  Overall, pt with improved engagement with therapist today in comparison to previous therapy sessions, requiring less cues for motivation to participate.  Pt was left in room in wheelchair with quick release belt donned and wife at bedside.  Continue per current plan of care.    Function:  Eating Eating        Cognition Comprehension Comprehension assist level: Understands basic 75 - 89% of the time/ requires cueing 10 - 24% of the time  Expression    Expression assist level: Expresses basic 90% of the time/requires cueing < 10% of the time.  Social Interaction Social Interaction assist level: Interacts appropriately 75 - 89% of the time - Needs redirection for appropriate language or to initiate interaction.  Problem Solving Problem solving assist level: Solves basic 50 - 74% of the time/requires cueing 25 - 49% of the time  Memory Memory assist level: Recognizes or recalls 50 - 74% of the time/requires cueing 25 - 49% of the time    Pain Pain Assessment Pain Assessment: No/denies pain  Therapy/Group: Individual Therapy  Arika Mainer, Melanee SpryNicole L 01/20/2016, 4:10 PM

## 2016-01-20 NOTE — Progress Notes (Signed)
Social Work Patient ID: Jon [name], male   DOB: 1955-10-29, 60 y.o.   MRN: [id number] Met with pt and wife to discuss team conference goals-min assist wheelchair level and target discharge date 7/13. She is in the process of applying for disability for pt. This worker is assisting her with this. She is trying to be here daily to observe pt in therapies so she can see his progress and is realistic regarding her expectations of him at discharge. She is hopeful he will be able to ambulate with her at discharge. Will work on discharge needs and provide support.

## 2016-01-20 NOTE — Progress Notes (Signed)
Subjective/Complaints: Poor appetite noted by nurse assistant. Patient complains of bladder fullness, condom catheter was emptied this morning for 950 cc, patient scanned post void residual  Only 130 cc  Review of systems denies chest pain shortness of breath nausea vomiting diarrhea or constipation, tired all the time  Objective: Vital Signs: Blood pressure 132/68, pulse 66, temperature 98.7 F (37.1 C), temperature source Oral, resp. rate 18, SpO2 99 %. No results found. No results found for this or any previous visit (from the past 72 hour(s)).  HEENT: normal Cardio: RRR Resp: CTA B/L and unlabored GI: BS positive and mild epigastric distention and tenderness Extremity:  Pulses positive and No Edema Skin:   Other left antecubital fossa with erythema at  prior IV site,, mild  macular rash Neuro: Confused, Cranial Nerve Abnormalities left central 7, Abnormal Sensory Decreased sensation left upper and left lower limb, Abnormal Motor 0/5 left upper extremity 0/5 left lower extremity 5/5 right upper tremor E5/5  right lower extremity, Tone:  Increased tone  in the left elbow flexor as well as left knee extensor Ashworth grade 3 in the lower limb and 2 in the upper limb and Other affect is inappropriate Musc/Skel:  Other mild pain with  elbow and knee range of motion on the left side pain is in the biceps  and  quad muscle respectively. Not in the joint Gen. no acute distress   Assessment/Plan: 1. Functional deficits secondary to right hemiparesis causing left hemiplegia which require 3+ hours per day of interdisciplinary therapy in a comprehensive inpatient rehab setting. Physiatrist is providing close team supervision and 24 hour management of active medical problems listed below. Physiatrist and rehab team continue to assess barriers to discharge/monitor patient progress toward functional and medical goals. FIM: Function - Bathing Position: Bed (pt too fatigued to sit up) Body parts  bathed by patient: Chest, Abdomen, Left arm Body parts bathed by helper: Right arm Bathing not applicable: Front perineal area, Buttocks, Right upper leg, Left upper leg, Right lower leg, Left lower leg (pt declined) Assist Level: 2 helpers (Max assist, 2 helpers for buttocks in standing)  Function- Upper Body Dressing/Undressing Upper body dressing/undressing activity did not occur: Refused What is the patient wearing?: Pull over shirt/dress Pull over shirt/dress - Perfomed by patient: Thread/unthread right sleeve, Put head through opening Pull over shirt/dress - Perfomed by helper: Thread/unthread left sleeve, Pull shirt over trunk Assist Level:  (Total assist) Function - Lower Body Dressing/Undressing What is the patient wearing?: Pants Position: Bed Pants- Performed by helper: Thread/unthread right pants leg, Thread/unthread left pants leg, Pull pants up/down Non-skid slipper socks- Performed by helper: Don/doff right sock, Don/doff left sock Assist for lower body dressing: 2 Helpers  Function - Toileting Toileting activity did not occur: No continent bowel/bladder event Toileting steps completed by helper: Adjust clothing prior to toileting, Performs perineal hygiene, Adjust clothing after toileting Toileting Assistive Devices: Grab bar or rail Assist level: Two helpers  Function - Air cabin crew transfer activity did not occur: Safety/medical concerns Toilet transfer assistive device: Grab bar Assist level to toilet: Maximal assist (Pt 25 - 49%/lift and lower) Assist level from toilet: 2 helpers  Function - Chair/bed transfer Chair/bed transfer method: Squat pivot Chair/bed transfer assist level: Moderate assist (Pt 50 - 74%/lift or lower) Chair/bed transfer assistive device: Armrests Chair/bed transfer details: Verbal cues for precautions/safety, Tactile cues for placement, Tactile cues for sequencing, Tactile cues for weight shifting, Verbal cues for  technique  Function - Locomotion: Wheelchair  Will patient use wheelchair at discharge?:  (TBD) Type: Manual Max wheelchair distance: 50 Assist Level: Touching or steadying assistance (Pt > 75%) Assist Level: Touching or steadying assistance (Pt > 75%) Wheel 150 feet activity did not occur: Safety/medical concerns Turns around,maneuvers to table,bed, and toilet,negotiates 3% grade,maneuvers on rugs and over doorsills: No Function - Locomotion: Ambulation Ambulation activity did not occur: Safety/medical concerns Assistive device: Rail in hallway Max distance: 3' Assist level: 2 helpers (maxA for ambulation, +2 for w/c follow)  Function - Comprehension Comprehension: Auditory Comprehension assist level: Follows basic conversation/direction with extra time/assistive device  Function - Expression Expression: Verbal Expression assist level: Expresses basic 75 - 89% of the time/requires cueing 10 - 24% of the time. Needs helper to occlude trach/needs to repeat words.  Function - Social Interaction Social Interaction assist level: Interacts appropriately 75 - 89% of the time - Needs redirection for appropriate language or to initiate interaction.  Function - Problem Solving Problem solving assist level: Solves basic 25 - 49% of the time - needs direction more than half the time to initiate, plan or complete simple activities  Function - Memory Memory assist level: Recognizes or recalls 25 - 49% of the time/requires cueing 50 - 75% of the time Patient normally able to recall (first 3 days only): Current season, That he or she is in a hospital  Medical Problem List and Plan: 1.Left hemiplegia   secondary to right basal ganglia hemorrhage secondary to hypertensive crisis  Continue CIR PT, OT SLP, Team conference today please see physician documentation under team conference tab, met with team face-to-face to discuss problems,progress, and goals. Formulized individual treatment plan based on  medical history, underlying problem and comorbidities. 2.  DVT Prophylaxis/Anticoagulation: Subcutaneous heparin initiated 01/10/2016. Monitor for any bleeding episodes 3. Pain Management/headaches: Tylenol 3 as needed, Post stroke headaches start Topamax 25 twice a day May need to titrate upwards- no headache complaints this morning, we'll start topical analgesic cream 4. Dysphagia. Dysphagia #1 nectar liquids. Follow-up speech therapy. 5. Neuropsych: This patient is capable of making decisions on his own behalf. 6. Skin/Wound Care: Routine skin checks, Start Keflex for left antecubital fossa  Cellulitis, superficial 7. Fluids/Electrolytes/Nutrition: Routine I&O's with follow-up chemistries changed to lactose-free diet this is helping with abdominal discomfort, poor intake start Megace, this may also be cognitive based 8. Hypertension. Norvasc 10 mg daily, Lopressor 75 mg twice a day. Monitor with increased mobility 9. History of alcohol use. Provided counseling monitor for any signs of withdrawal, CMET showed mildly elevated AST but otherwise no abnormalities. Discussed results with his wife.10. Hyperlipidemia. Pravachol 11. Mood. Zoloft 50 mg daily. Provide emotional support 12. Urinary tract infection will start Keflex UA positive, culture not sent, will finish seven-day treatment 31. Cognitive deficits post stroke continue speech therapy LOS (Days) 6 A FACE TO FACE EVALUATION WAS PERFORMED  KIRSTEINS,ANDREW E 01/20/2016, 9:20 AM

## 2016-01-21 ENCOUNTER — Inpatient Hospital Stay (HOSPITAL_COMMUNITY): Payer: PRIVATE HEALTH INSURANCE | Admitting: Occupational Therapy

## 2016-01-21 ENCOUNTER — Inpatient Hospital Stay (HOSPITAL_COMMUNITY): Payer: PRIVATE HEALTH INSURANCE | Admitting: Speech Pathology

## 2016-01-21 ENCOUNTER — Inpatient Hospital Stay (HOSPITAL_COMMUNITY): Payer: PRIVATE HEALTH INSURANCE | Admitting: Physical Therapy

## 2016-01-21 DIAGNOSIS — R630 Anorexia: Secondary | ICD-10-CM | POA: Insufficient documentation

## 2016-01-21 NOTE — Progress Notes (Signed)
Orthopedic Tech Progress Note Patient Details:  Tawanna CoolerGeorge D Stogner Mar 10, 1956 [id number] Brace order completed by hanger vendor. Patient ID: Tawanna CoolerGeorge D Jamerson, male   DOB: Mar 10, 1956, 60 y.o.   MRN: [id number]   Jennye MoccasinHughes, Evamaria Detore Craig 01/21/2016, 4:15 PM

## 2016-01-21 NOTE — Progress Notes (Signed)
Physical Therapy Session Note  Patient Details  Name: Jon [name] MRN: [id number] Date of Birth: 11-Jan-1956  Today's Date: 01/21/2016 PT Individual Time: 0800-0900 and 1500-1530 PT Individual Time Calculation (min): 60 min and 30 min (total 90 min)   Short Term Goals: Week 1:  PT Short Term Goal 1 (Week 1): Pt will perform squat pivot transfer w/c <>bed with modA PT Short Term Goal 2 (Week 1): Pt will perform sit <>stand modA PT Short Term Goal 3 (Week 1): Pt will perform w/c propulsion minA with R hemi technique PT Short Term Goal 4 (Week 1): Pt will initiate gait training  Skilled Therapeutic Interventions/Progress Updates:    Tx 1: Pt received supine in bed, denies pain and agreeable to treatment. Supine>sit with minA, bedrails and HOB elevated. Sit <>stand from EOB with bedrails and minA for therapist to doff pants/brief, perform hygiene and don clean pants/brief. Squat pivot transfer bed>w/c modA. Pt setup for breakfast and ate several bites before requesting to use toilet to have bowel movement. Squat pivot transfer maxA to L side onto toilet, modA to R side to return to w/c. Sit <>stand minA from toilet with grab bar for therapist to perform hygiene and clothing management totalA. Setup again for finishing breakfast; requires max cues to attend to task and initiate self-feeding. Wife given handout for home measurements and discussed taking pictures of areas of concern if pt were to d/c with w/c, also mentioned possibility of needing a ramp for home entry depending on progress. Pt remained seated in w/c at completion of session, all needs in reach and quick release belt intact with wife present.   Tx 2: Pt received supine in bed, initially declining therapy because he had "just fallen asleep", but agreeable with encouragement. Supine>sit minA with facilitation for LLE off EOB. Squat pivot transfer bed <>w/c modA with pt initiating transfer closer to stand pivot however did not reach full  stand. Gait with R rail in hall 2x25' with maxA; second trial with LLE GRAFO to A with foot clearance and knee control in stance. Note improving weight acceptance and activation on L side during gait. Pt continues to require max encouragement and facilitation to initiate most activities as pt has poor initiation, motivation and awareness of deficits. Returned to bed as above, remained supine in bed with all needs in reach at completion of session.   Therapy Documentation Precautions:  Precautions Precautions: Fall Precaution Comments: L hemiparesis/flaccid Restrictions Weight Bearing Restrictions: No Pain: Pain Assessment Pain Assessment: No/denies pain   See Function Navigator for Current Functional Status.   Therapy/Group: Individual Therapy  [name] 01/21/2016, 9:04 AM

## 2016-01-21 NOTE — Progress Notes (Signed)
Occupational Therapy Session Note  Patient Details  Name: Jon [name] MRN: [id number] Date of Birth: October 17, 1955  Today's Date: 01/21/2016 OT Individual Time: 1100-1200 OT Individual Time Calculation (min): 60 min    Short Term Goals: Week 1:  OT Short Term Goal 1 (Week 1): Pt will complete squat pivot to toilet with mod assist of one caregiver OT Short Term Goal 2 (Week 1): Pt will complete transfer into tub/shower with tub bench and mod assist of one caregiver OT Short Term Goal 3 (Week 1): Pt will complete bathing with mod assist of one caregiver OT Short Term Goal 4 (Week 1): Pt will complete LB dressing with max assist of one caregiver OT Short Term Goal 5 (Week 1): Pt will scan to Lt visual field to locate items to complete self-care tasks with min cues and increased time  Skilled Therapeutic Interventions/Progress Updates:    Pt taken to the therapy gym for session as he declined attempting shower or bathing.  Pt's wife present as well and accompanied us to the therapy gym.  Pt perseverating on laying back down in the bed but therapist was able to re-direct him most of the session.  Max assist stand pivot transfer from wheelchair to therapy mat.  [name] was able to maintain static sitting balance with supervision but exhibits increased cervical rotation to the right with cervical flexion as well as right lean.  Used mirror for visual feedback with pt demonstrating ability to achieve and maintain anterior pelvic tilt with midline orientation.  Placed LUE in weightbearing on the mat while having pt scan left of midline for locating various colored clothespins.  Pt had to pick up the clothespins with the RUE and reach across to the left to place them in container.  Mod assist needed for regain balance after each trial.  Pt able to tolerate 20 mins of reaching tasks with emphasis on trying to activate the left trunk to return to midline.  He reported increased pain in his right lower back  region with therapist assisting him into supine for rest break.  After 3-4 mins rest, pt transitioned back to sitting with mod assist.  Worked on sit to stand, standing balance, and stepping with total +2 (pt 30%).  Pt needs max assist to advance the LLE as well as total +2 (pt 30%) for unweighting the right and moving it forward.  Pt positioned in wheelchair and returned to room with spouse present and safety belt in place.    Therapy Documentation Precautions:  Precautions Precautions: Fall Precaution Comments: L hemiparesis/flaccid, right head turn Restrictions Weight Bearing Restrictions: No  Pain: Pain Assessment Pain Assessment: Faces Pain Score: 0-No pain Faces Pain Scale: Hurts little more Pain Type: Acute pain Pain Location: Back Pain Orientation: Lower Pain Intervention(s): Repositioned;Emotional support ADL: See Function Navigator for Current Functional Status.   Therapy/Group: Individual Therapy  Lasheena Frieze OTR/L 01/21/2016, 12:54 PM

## 2016-01-21 NOTE — Progress Notes (Signed)
Speech Language Pathology Daily Session Note  Patient Details  Name: Jon [name] MRN: [name] Date of Birth: Dec 06, 1955  Today's Date: 01/21/2016 SLP Individual Time: [id number] SLP Individual Time Calculation (min): 40 min  Short Term Goals: Week 1: SLP Short Term Goal 1 (Week 1): Pt will sustain his attention to basic tasks for 1-2 minute intervals with mod assist verbal cues for redirection.  SLP Short Term Goal 2 (Week 1): Pt will verbalize at least 2 deficits s/p CVA with mod assist question cues.   SLP Short Term Goal 3 (Week 1): Pt will be intelligible at the conversational level with min assist verbal cues for overarticulation, increased vocal intensity and slow rate of speech.   SLP Short Term Goal 4 (Week 1): Pt will consume dys 3 textures and thin liquids with min assist verbal cues for use of swallowing precautions.   SLP Short Term Goal 5 (Week 1): Pt will visually scan to the left of midline during basic, famliar tasks with max assist multimodal cues in 50% of opportunities.    Skilled Therapeutic Interventions:  Pt was seen for skilled ST targeting cognitive goals.  Therapist facilitated the session with a novel card game targeting visual scanning to the left of midline and sustained attention to task.  Pt was fatigued but agreeable to participate in therapies with minimal encouragement.  Pt required mod assist verbal and visual cues to locate cards to the left of midline and was able to sustain his attention to task for 1 minute intervals with mod assist verbal cues for redirection.  Performance slightly deteriorated towards the end of today's therapy due to fatigue.  Pt was returned to room at the end of today's therapy session and left in wheelchair with quick release belt donned and wife at bedside.  Continue per current plan of care.    Function:  Eating Eating                 Cognition Comprehension Comprehension assist level: Follows basic  conversation/direction with extra time/assistive device  Expression   Expression assist level: Expresses basic 90% of the time/requires cueing < 10% of the time.  Social Interaction Social Interaction assist level: Interacts appropriately 75 - 89% of the time - Needs redirection for appropriate language or to initiate interaction.  Problem Solving Problem solving assist level: Solves basic 50 - 74% of the time/requires cueing 25 - 49% of the time  Memory Memory assist level: Recognizes or recalls 50 - 74% of the time/requires cueing 25 - 49% of the time    Pain Pain Assessment Pain Assessment: No/denies pain Pain Score: 0-No pain  Therapy/Group: Individual Therapy  Kea Callan, Melanee SpryNicole L 01/21/2016, 12:21 PM

## 2016-01-21 NOTE — Progress Notes (Signed)
Orthopedic Tech Progress Note Patient Details:  Tawanna CoolerGeorge D Nakanishi December 28, 1955 [id number]  Patient ID: Tawanna CoolerGeorge D Felkins, male   DOB: December 28, 1955, 60 y.o.   MRN: [id number] Called in advanced brace order; spoke with Tonny BollmanShameka  Kendrix Orman 01/21/2016, 2:33 PM

## 2016-01-21 NOTE — Progress Notes (Signed)
Subjective/Complaints: Pt laying in bed this AM.  He slept fairly overnight.   Review of systems denies chest pain shortness of breath nausea vomiting diarrhea and constipation  Objective: Vital Signs: Blood pressure 127/65, pulse 98, temperature 98.4 F (36.9 C), temperature source Oral, resp. rate 18, weight 90.7 kg (199 lb 15.3 oz), SpO2 98 %. No results found. No results found for this or any previous visit (from the past 72 hour(s)).  HEENT: Normocephalic, atraumatic.  Cardio: RRR Resp: CTA B/L and unlabored GI: BS positive and mild epigastric distention and tenderness Skin:   Other left antecubital fossa with erythema at  prior IV site, mild  macular rash Neuro: Confused, Cranial Nerve Abnormalities left central 7 Right facial weakness  Abnormal Sensory Decreased sensation left upper and left lower limb Abnormal Motor 0/5 left upper extremity 0/5 left lower extremity 5/5 right upper extremity 5/5  right lower extremity Tone:  Increased tone  in the left elbow flexor as well as left knee extensor Ashworth grade 3 in the lower limb and 1/4 in the left elbow flexion Musc/Skel: No edema. No tenderness.  Gen. no acute distress. Vital signs reviewed.  Psych: Mood and affect normal   Assessment/Plan: 1. Functional deficits secondary to right hemiparesis causing left hemiplegia which require 3+ hours per day of interdisciplinary therapy in a comprehensive inpatient rehab setting. Physiatrist is providing close team supervision and 24 hour management of active medical problems listed below. Physiatrist and rehab team continue to assess barriers to discharge/monitor patient progress toward functional and medical goals. FIM: Function - Bathing Position: Bed (pt too fatigued to sit up) Body parts bathed by patient: Chest, Abdomen, Left arm Body parts bathed by helper: Right arm Bathing not applicable: Front perineal area, Buttocks, Right upper leg, Left upper leg, Right lower leg,  Left lower leg (pt declined) Assist Level: 2 helpers (Max assist, 2 helpers for buttocks in standing)  Function- Upper Body Dressing/Undressing Upper body dressing/undressing activity did not occur: Refused What is the patient wearing?: Pull over shirt/dress Pull over shirt/dress - Perfomed by patient: Thread/unthread right sleeve, Put head through opening Pull over shirt/dress - Perfomed by helper: Thread/unthread left sleeve, Pull shirt over trunk Assist Level: Touching or steadying assistance(Pt > 75%) Function - Lower Body Dressing/Undressing What is the patient wearing?: Pants Position: Wheelchair/chair at sink Pants- Performed by helper: Thread/unthread right pants leg, Thread/unthread left pants leg, Pull pants up/down Non-skid slipper socks- Performed by helper: Don/doff right sock, Don/doff left sock Socks - Performed by helper: Don/doff right sock, Don/doff left sock Shoes - Performed by helper: Don/doff right shoe, Don/doff left shoe Assist for footwear: Dependant (able to doff R sock only) Assist for lower body dressing: 2 Helpers  Function - Toileting Toileting activity did not occur: No continent bowel/bladder event Toileting steps completed by helper: Adjust clothing prior to toileting, Performs perineal hygiene, Adjust clothing after toileting Toileting Assistive Devices: Grab bar or rail Assist level:  Biomedical engineer(maxA)  Function - ArchivistToilet Transfers Toilet transfer activity did not occur: Safety/medical concerns Toilet transfer assistive device: Grab bar Assist level to toilet: Maximal assist (Pt 25 - 49%/lift and lower) Assist level from toilet: Maximal assist (Pt 25 - 49%/lift and lower)  Function - Chair/bed transfer Chair/bed transfer method: Squat pivot Chair/bed transfer assist level: Moderate assist (Pt 50 - 74%/lift or lower) Chair/bed transfer assistive device: Armrests Chair/bed transfer details: Verbal cues for precautions/safety, Tactile cues for placement, Tactile  cues for sequencing, Tactile cues for weight shifting, Verbal cues  for technique  Function - Locomotion: Wheelchair Will patient use wheelchair at discharge?:  (TBD) Type: Manual Max wheelchair distance: 50 Assist Level: Touching or steadying assistance (Pt > 75%) Assist Level: Touching or steadying assistance (Pt > 75%) Wheel 150 feet activity did not occur: Safety/medical concerns Turns around,maneuvers to table,bed, and toilet,negotiates 3% grade,maneuvers on rugs and over doorsills: No Function - Locomotion: Ambulation Ambulation activity did not occur: Safety/medical concerns Assistive device: Rail in hallway Max distance: 12 Assist level: 2 helpers (maxA; +2 for w/c follow) Assist level: 2 helpers  Function - Comprehension Comprehension: Auditory Comprehension assist level: Understands basic 90% of the time/cues < 10% of the time  Function - Expression Expression: Verbal Expression assist level: Expresses basic 90% of the time/requires cueing < 10% of the time.  Function - Social Interaction Social Interaction assist level: Interacts appropriately 75 - 89% of the time - Needs redirection for appropriate language or to initiate interaction.  Function - Problem Solving Problem solving assist level: Solves basic 75 - 89% of the time/requires cueing 10 - 24% of the time  Function - Memory Memory assist level: Recognizes or recalls 75 - 89% of the time/requires cueing 10 - 24% of the time Patient normally able to recall (first 3 days only): Current season  Medical Problem List and Plan: 1.Left hemiplegia   secondary to right basal ganglia hemorrhage secondary to hypertensive crisis    Continue CIR   PRAFO, splints ordered  Fluoxetine started 6/29 2.  DVT Prophylaxis/Anticoagulation: Subcutaneous heparin initiated 01/10/2016. Monitor for any bleeding episodes 3. Pain Management/headaches: Tylenol 3 as needed, Post stroke headaches start Topamax 25 twice a day, topical  analgesic cream 4. Dysphagia. Dysphagia #1 nectar liquids. Follow-up speech therapy. 5. Neuropsych: This patient is capable of making decisions on his own behalf. 6. Skin/Wound Care: Routine skin checks, Start Keflex for left antecubital fossa  Cellulitis, superficial 7. Fluids/Electrolytes/Nutrition: Routine I&O's   changed to lactose-free diet this is helping with abdominal discomfort  poor intake, started Megace, this may also be cognitive based 8. Hypertension. Norvasc 10 mg daily, Lopressor 75 mg twice a day. Monitor with increased mobility 9. History of alcohol use. Provided counseling monitor for any signs of withdrawal, CMET showed mildly elevated AST but otherwise no abnormalities. Discussed results with his wife. 10. Hyperlipidemia. Pravachol 11. Mood. Zoloft 50 mg daily. Provide emotional support 12. Urinary tract infection will start Keflex UA positive, culture not sent, will finish seven-day treatment today.  Will assess condom cath 13. Cognitive deficits post stroke continue speech therapy  LOS (Days) 7 A FACE TO FACE EVALUATION WAS PERFORMED  Jon [name] Jon [name] Jon [name] 01/21/2016, 9:19 AM

## 2016-01-22 ENCOUNTER — Inpatient Hospital Stay (HOSPITAL_COMMUNITY): Payer: PRIVATE HEALTH INSURANCE | Admitting: Occupational Therapy

## 2016-01-22 ENCOUNTER — Inpatient Hospital Stay (HOSPITAL_COMMUNITY): Payer: PRIVATE HEALTH INSURANCE | Admitting: Physical Therapy

## 2016-01-22 ENCOUNTER — Inpatient Hospital Stay (HOSPITAL_COMMUNITY): Payer: PRIVATE HEALTH INSURANCE | Admitting: Speech Pathology

## 2016-01-22 NOTE — Progress Notes (Signed)
Speech Language Pathology Weekly Progress and Session Note  Patient Details  Name: Jon [name] MRN: [id number] Date of Birth: 01/23/1956  Beginning of progress report period: January 15, 2016  End of progress report period:  January 22, 2016   Today's Date: 01/22/2016 SLP Individual Time: 0915-0952 SLP Individual Time Calculation (min): 37 min  Short Term Goals: Week 1: SLP Short Term Goal 1 (Week 1): Pt will sustain his attention to basic tasks for 1-2 minute intervals with mod assist verbal cues for redirection.  SLP Short Term Goal 1 - Progress (Week 1): Met SLP Short Term Goal 2 (Week 1): Pt will verbalize at least 2 deficits s/p CVA with mod assist question cues.   SLP Short Term Goal 2 - Progress (Week 1): Met SLP Short Term Goal 3 (Week 1): Pt will be intelligible at the conversational level with min assist verbal cues for overarticulation, increased vocal intensity and slow rate of speech.   SLP Short Term Goal 3 - Progress (Week 1): Other (comment) (not addressed this reporting period to allow focus on cognitive goals ) SLP Short Term Goal 4 (Week 1): Pt will consume dys 3 textures and thin liquids with min assist verbal cues for use of swallowing precautions.   SLP Short Term Goal 4 - Progress (Week 1): Progressing toward goal SLP Short Term Goal 5 (Week 1): Pt will visually scan to the left of midline during basic, famliar tasks with max assist multimodal cues in 50% of opportunities.   SLP Short Term Goal 5 - Progress (Week 1): Met    New Short Term Goals: Week 2: SLP Short Term Goal 1 (Week 2): Pt will sustain his attention to basic tasks for 3-5 minute intervals with mod assist verbal cues for redirection.  SLP Short Term Goal 2 (Week 2): Pt will verbalize at least 2 deficits s/p CVA with min assist question cues.   SLP Short Term Goal 3 (Week 2): Pt will be intelligible at the conversational level with min assist verbal cues for overarticulation, increased vocal intensity and  slow rate of speech.   SLP Short Term Goal 4 (Week 2): Pt will consume dys 3 textures and thin liquids with min assist verbal cues for use of swallowing precautions.   SLP Short Term Goal 5 (Week 2): Pt will initiate visual scanning to the left of midline during basic, famliar tasks with min assist verbal cues in 50% of opportunities.    Weekly Progress Updates:  Pt has made functional gains this reporting period and has met 3 out of 4 addressed goals.  Goals for dysarthria not addressed this reporting period to allow focus on cognition which is pt's primary barrier to progress in therapy.  Pt has demonstrated improvement in his ability to sustain his attention to tasks for brief periods of time. Pt is also more consistently able to scan to the left of midline when cued to do so during functional tasks.  Overall pt requires mod assist for basic tasks due to moderately severe cognitive impairment.  As a result, pt would continue to benefit from skilled ST while inpatient in order to maximize functional independence and reduce burden of care prior to discharge.  Pt and family education is ongoing.     Intensity: Minumum of 1-2 x/day, 30 to 90 minutes Frequency: 3 to 5 out of 7 days Duration/Length of Stay: 24-27 days  Treatment/Interventions: Cognitive remediation/compensation;Cueing hierarchy;Dysphagia/aspiration precaution training;Environmental controls;Internal/external aids;Multimodal communication approach;Patient/family education   Daily Session  Skilled  Therapeutic Interventions: Pt was seen for skilled ST targeting cognitive goals.  Pt was in bed upon arrival and required min encouragement to participate in therapy.  Pt transferred to wheelchair via [facility] lift with assistance from Advice worker.  Pt required mod assist verbal cues to initiate and sequence basic sinkside ADLs due to perseveration and poor task organization.  Mod assist verbal cues needed for redirection throughout tasks due  to poor topic maintenance and internal distraction.  Pt was left in wheelchair at the end of today's therapy session with quick release belt donned and call bell left within reach.  Goals updated on this date to reflect current progress and plan of care.     Function:   Eating Eating                 Cognition Comprehension Comprehension assist level: Understands basic 90% of the time/cues < 10% of the time  Expression   Expression assist level: Expresses basic 75 - 89% of the time/requires cueing 10 - 24% of the time. Needs helper to occlude trach/needs to repeat words.  Social Interaction Social Interaction assist level: Interacts appropriately 50 - 74% of the time - May be physically or verbally inappropriate.  Problem Solving Problem solving assist level: Solves basic 50 - 74% of the time/requires cueing 25 - 49% of the time  Memory Memory assist level: Recognizes or recalls 75 - 89% of the time/requires cueing 10 - 24% of the time   General    Pain Pain Assessment Pain Assessment: No/denies pain  Therapy/Group: Individual Therapy  Ashlon Lottman, Selinda Orion 01/22/2016, 12:03 PM

## 2016-01-22 NOTE — Progress Notes (Signed)
Subjective/Complaints: Pt resting in bed.  Wife at bedside.  He is wearing his braces.  He slept well.    Review of systems denies chest pain shortness of breath nausea vomiting diarrhea and constipation  Objective: Vital Signs: Blood pressure 149/74, pulse 65, temperature 98.2 F (36.8 C), temperature source Oral, resp. rate 18, weight 90.7 kg (199 lb 15.3 oz), SpO2 99 %. No results found. No results found for this or any previous visit (from the past 72 hour(s)).  HEENT: Normocephalic, atraumatic.  Cardio: RRR Resp: CTA B/L and unlabored GI: BS positive and mild epigastric distention and tenderness Skin:   Warm and dry.  Neuro: Confused, Cranial Nerve Abnormalities left central 7 Right facial weakness  Abnormal Sensory Decreased sensation left upper and left lower limb Abnormal Motor 0/5 left upper extremity 0/5 left lower extremity 5/5 right upper extremity 5/5  right lower extremity Tone:  Increased tone  in the left elbow flexor as well as left knee extensor Ashworth grade 3 in the lower limb and 1/4 in the left elbow flexion Musc/Skel: No edema. No tenderness.  Gen. no acute distress. Vital signs reviewed.  Psych: Mood and affect normal  Assessment/Plan: 1. Functional deficits secondary to right hemiparesis causing left hemiplegia which require 3+ hours per day of interdisciplinary therapy in a comprehensive inpatient rehab setting. Physiatrist is providing close team supervision and 24 hour management of active medical problems listed below. Physiatrist and rehab team continue to assess barriers to discharge/monitor patient progress toward functional and medical goals. FIM: Function - Bathing Position: Bed (pt too fatigued to sit up) Body parts bathed by patient: Chest, Abdomen, Left arm Body parts bathed by helper: Right arm Bathing not applicable: Front perineal area, Buttocks, Right upper leg, Left upper leg, Right lower leg, Left lower leg (pt declined) Assist Level:  2 helpers (Max assist, 2 helpers for buttocks in standing)  Function- Upper Body Dressing/Undressing Upper body dressing/undressing activity did not occur: Refused What is the patient wearing?: Pull over shirt/dress Pull over shirt/dress - Perfomed by patient: Thread/unthread right sleeve, Put head through opening Pull over shirt/dress - Perfomed by helper: Thread/unthread left sleeve, Pull shirt over trunk Assist Level: Touching or steadying assistance(Pt > 75%) Function - Lower Body Dressing/Undressing What is the patient wearing?: Pants Position: Wheelchair/chair at sink Pants- Performed by helper: Thread/unthread right pants leg, Thread/unthread left pants leg, Pull pants up/down Non-skid slipper socks- Performed by helper: Don/doff right sock, Don/doff left sock Socks - Performed by helper: Don/doff right sock, Don/doff left sock Shoes - Performed by helper: Don/doff right shoe, Don/doff left shoe Assist for footwear: Dependant (able to doff R sock only) Assist for lower body dressing: 2 Helpers  Function - Toileting Toileting activity did not occur: No continent bowel/bladder event Toileting steps completed by helper: Adjust clothing prior to toileting, Performs perineal hygiene, Adjust clothing after toileting Toileting Assistive Devices: Grab bar or rail Assist level:  Biomedical engineer(maxA)  Function - ArchivistToilet Transfers Toilet transfer activity did not occur: Safety/medical concerns Toilet transfer assistive device: Grab bar Assist level to toilet: Maximal assist (Pt 25 - 49%/lift and lower) Assist level from toilet: Maximal assist (Pt 25 - 49%/lift and lower)  Function - Chair/bed transfer Chair/bed transfer method: Squat pivot Chair/bed transfer assist level: Moderate assist (Pt 50 - 74%/lift or lower) Chair/bed transfer assistive device: Armrests Chair/bed transfer details: Verbal cues for precautions/safety, Tactile cues for placement, Tactile cues for sequencing, Tactile cues for  weight shifting, Verbal cues for technique  Function -  Locomotion: Wheelchair Will patient use wheelchair at discharge?:  (TBD) Type: Manual Max wheelchair distance: 50 Assist Level: Touching or steadying assistance (Pt > 75%) Assist Level: Touching or steadying assistance (Pt > 75%) Wheel 150 feet activity did not occur: Safety/medical concerns Turns around,maneuvers to table,bed, and toilet,negotiates 3% grade,maneuvers on rugs and over doorsills: No Function - Locomotion: Ambulation Ambulation activity did not occur: Safety/medical concerns Assistive device: Rail in hallway Max distance: 25 Assist level: Maximal assist (Pt 25 - 49%) Assist level: Maximal assist (Pt 25 - 49%)  Function - Comprehension Comprehension: Auditory Comprehension assist level: Understands basic 75 - 89% of the time/ requires cueing 10 - 24% of the time  Function - Expression Expression: Verbal Expression assist level: Expresses basic 75 - 89% of the time/requires cueing 10 - 24% of the time. Needs helper to occlude trach/needs to repeat words.  Function - Social Interaction Social Interaction assist level: Interacts appropriately 75 - 89% of the time - Needs redirection for appropriate language or to initiate interaction.  Function - Problem Solving Problem solving assist level: Solves basic 75 - 89% of the time/requires cueing 10 - 24% of the time  Function - Memory Memory assist level: Recognizes or recalls 75 - 89% of the time/requires cueing 10 - 24% of the time Patient normally able to recall (first 3 days only): Current season  Medical Problem List and Plan: 1.Left hemiplegia   secondary to right basal ganglia hemorrhage secondary to hypertensive crisis    Continue CIR   PRAFO, splints ordered  Fluoxetine started 6/29 2.  DVT Prophylaxis/Anticoagulation: Subcutaneous heparin initiated 01/10/2016. Monitor for any bleeding episodes 3. Pain Management/headaches: Tylenol 3 as needed, Post stroke  headaches start Topamax 25 twice a day, topical analgesic cream 4. Dysphagia. Dysphagia #1 nectar liquids. Follow-up speech therapy. 5. Neuropsych: This patient is capable of making decisions on his own behalf. 6. Skin/Wound Care: Routine skin checks, Start Keflex for left antecubital fossa  Cellulitis, superficial 7. Fluids/Electrolytes/Nutrition: Routine I&O's   changed to lactose-free diet this is helping with abdominal discomfort  poor intake, started Megace, this may also be cognitive based 8. Hypertension. Norvasc 10 mg daily, Lopressor 75 mg twice a day. Monitor with increased mobility 9. History of alcohol use. Provided counseling monitor for any signs of withdrawal, CMET showed mildly elevated AST but otherwise no abnormalities. Discussed results with his wife. 10. Hyperlipidemia. Pravachol 11. Mood. Zoloft 50 mg daily. Provide emotional support 12. Urinary tract infection completed 7 days of Keflex  13. Cognitive deficits post stroke continue speech therapy  LOS (Days) 8 A FACE TO FACE EVALUATION WAS PERFORMED  Meghin Thivierge Karis Jubanil Aerial Dilley 01/22/2016, 9:16 AM

## 2016-01-22 NOTE — Progress Notes (Signed)
Physical Therapy Weekly Progress Note  Patient Details  Name: Jon [name] MRN: [id number] Date of Birth: 12/10/1955  Beginning of progress report period: January 15, 2016 End of progress report period: January 22, 2016  Today's Date: 01/22/2016 PT Individual Time: 1102-1200 PT Individual Time Calculation (min): 58 min   Patient has met 3 of 4 short term goals.  Requires min/modA for bed mobility, modA for transfers, maxA for gait training. Limited primarily by flaccid LUE and poor motor control and apraxia in LLE. Also limited by poor initiation, attention, awareness and fatigue. Pt has initiated gait training and is demonstrating improved weight acceptance and stance control on LLE.   Patient continues to demonstrate the following deficits:  activity tolerance, balance, postural control, ability to compensate for deficits, functional use of  left upper extremity and left lower extremity, attention, awareness and coordination and therefore will continue to benefit from skilled PT intervention to enhance overall performance with bed mobility, transfers, gait, stairs, w/c propulsion.   Patient progressing toward long term goals..  Continue plan of care.  PT Short Term Goals Week 1:  PT Short Term Goal 1 (Week 1): Pt will perform squat pivot transfer w/c <>bed with modA PT Short Term Goal 1 - Progress (Week 1): Met PT Short Term Goal 2 (Week 1): Pt will perform sit <>stand modA PT Short Term Goal 2 - Progress (Week 1): Met PT Short Term Goal 3 (Week 1): Pt will perform w/c propulsion minA with R hemi technique PT Short Term Goal 3 - Progress (Week 1): Not met PT Short Term Goal 4 (Week 1): Pt will initiate gait training PT Short Term Goal 4 - Progress (Week 1): Met Week 2:  PT Short Term Goal 1 (Week 2): Pt will perform transfers w/c <>bed with minA PT Short Term Goal 2 (Week 2): Pt will perform bed mobility with S PT Short Term Goal 3 (Week 2): Pt will ambulate 72' with modA and LRAD PT Short  Term Goal 4 (Week 2): Pt will propel w/c minA with R hemi technique PT Short Term Goal 5 (Week 2): Pt will initiate stair training  Skilled Therapeutic Interventions/Progress Updates:    Pt received seated in w/c with handoff from OT; denies pain and agreeable to treatment. Transfer w/c <>flat bed in ADL apartment with modA stand pivot. Sit >supine minA for A LLE onto bed. Rolling L to sidelying as pt reports he sleeps at home, requires min guard and verbal cues for technique. Supine>sit minA again for LLE management and blocking at knees to prevent sliding forward off high bed. Transfer w/c >couch to R side minA, however requires maxA to transfer back to w/c to L side due to uphill and to weak side. Sit <>supine in couch as pt reports he sleeps on the couch regularly; requires minA for LLE. Gait 2x25' with maxA for LLE progression, modA for LLE stance control. Returned to bed with squat pivot minA. Sit >supine minA, and remained supine with alarm intact and all needs in reach at completion of session.   Therapy Documentation Precautions:  Precautions Precautions: Fall Precaution Comments: L hemiparesis/flaccid, right head turn Restrictions Weight Bearing Restrictions: No Pain: Pain Assessment Pain Assessment: No/denies pain Pain Type: Acute pain Pain Location: Back Pain Descriptors / Indicators: Discomfort;Penetrating Pain Intervention(s): Repositioned;Emotional support   See Function Navigator for Current Functional Status.  Therapy/Group: Individual Therapy  Luberta Mutter 01/22/2016, 12:53 PM

## 2016-01-22 NOTE — Progress Notes (Signed)
Occupational Therapy Weekly Progress Note  Patient Details  Name: Jon [name] MRN: [id number] Date of Birth: 07-19-1956  Beginning of progress report period: January 15, 2016 End of progress report period: January 22, 2016  Today's Date: 01/22/2016 OT Individual Time: 1447-1530 OT Individual Time Calculation (min): 43 min    Patient has met 1 of 5 short term goals.  Jon [name] continues to demonstrate decreased sustained attention with all selfcare tasks.  He is both internally and externally distracted with attention currently only 15-30 seconds.  He continues to demonstrate left neglect with right head turn but will scan left of midline with max instructional cueing to locate his wheelchair or dressing items.  Jon [name] demonstrates pushing to the left side with transitional movements of scooting or with standing.  Mod assist for squat pivot transfers to the right to the wheelchair or toilet are currently being completed.  He needs mod to max assist for sit to stand during LB selfcare with max assist to complete pulling pants and brief over hips.  Total assist for stand pivot transfers.  He demonstrates flat affect with continual perseveration on wanting to lay down and needing to "pee".  Pt continues to demonstrate Brunnstrum stage I movement in the left arm and hand at this time, needing total hand over hand assist to integrate into selfcare tasks.  Slight increased tone also noted in the left hamstring affecting sit to stand as the left foot if not stabilized with transitional movements will flex up under the wheelchair.  Based on his current level of assist with all functional tasks, feel he will continue to need extensive inpatient rehab for at least 2 more weeks.  Will continue with current OT POC.    Patient continues to demonstrate the following deficits: decreased balance, decreased awareness, decreased attention, decreased LUE and LLE strength and functional use, left neglect,  and therefore  will continue to benefit from skilled OT intervention to enhance overall performance with BADL.  Patient progressing toward long term goals..  Continue plan of care.  OT Short Term Goals Week 2:  OT Short Term Goal 1 (Week 2): Pt will complete transfer into tub/shower with tub bench and mod assist squat pivot of one caregiver. OT Short Term Goal 2 (Week 2): Pt will complete bathing with mod assist of one caregiver sit to stand. OT Short Term Goal 3 (Week 2): Pt will complete LB dressing with max assist of one caregiver sit to stand. OT Short Term Goal 4 (Week 2): Pt will complete toilet transfer with mod assist stand pivot to the 3:1. OT Short Term Goal 5 (Week 2): Pt/family will return demonstrate safe performance of PROM exercises for the LUE following handout.    Skilled Therapeutic Interventions/Progress Updates:    Pt transferred from supine to sit EOB with max assist and max demonstrational cueing to the right side.  He transferred squat pivot to the wheelchair with mod assist, but needed max instructional cueing for technique using Bobath method.  He continues to attempt to stabilize and push with the RUE so therapist had him hold onto the left arm in his lap to help decrease this.  Pt continually kept trying to bring the RUE to his side and push with it secondary to decreased sustained attention.  Used same method for transferring on and off of the therapy mat as well.  Worked on midline orientation with use of mirror for feedback.  Pt very distracted by other pt's and staff on  the right side of him.  Max instructional cueing to maintain attention to posture in mirror.  Worked on sit to stand and standing balance.  Pt only able to maintain standing for 1 min max during 3-4 intervals secondary to stating he felt tired and dizzy and needed to sit.  Max facilitation in standing at the left knee and to promote hip and lumbar extension.  Pt still tends to keep head flexed in standing or to the right.   Attempted pre-gait activity of having him shift his weight over to the left foot and step forward on the RLE.  Total assist for pt to maintain standing and attempt 2-3 forward and backward steps with the right foot.  He demonstrates extreme difficulty unweighting the RLE for adequate stepping.  Pt transferred back to the wheelchair at end of session and pushed back to room.  Discussed progress with wife as well as current limitations.  Encouraged her to try and keep pt up OOB as much as possible, even taking him out of the room when needed so he doesn't perseverate on wanting to get back in the bed.  Also discussed having him look more to the left during conversation with her, or to even place utensils and his drink to the left when he is eating.   Therapy Documentation Precautions:  Precautions Precautions: Fall Precaution Comments: L hemiparesis/flaccid, right head turn Restrictions Weight Bearing Restrictions: No  Pain: Pain Assessment Pain Assessment: Faces Faces Pain Scale: Hurts even more Pain Type: Acute pain Pain Orientation: Lower;Left Pain Intervention(s): Repositioned ADL: See Function Navigator for Current Functional Status.   Therapy/Group: Individual Therapy  Ednamae Schiano OTR/L 01/22/2016, 4:02 PM

## 2016-01-22 NOTE — Progress Notes (Signed)
Occupational Therapy Session Note  Patient Details  Name: Jon [name] D Belton MRN: [id number] Date of Birth: 10-01-1955  Today's Date: 01/22/2016 OT Individual Time: 1015-1102 OT Individual Time Calculation (min): 47 min    Short Term Goals: Week 1:  OT Short Term Goal 1 (Week 1): Pt will complete squat pivot to toilet with mod assist of one caregiver OT Short Term Goal 2 (Week 1): Pt will complete transfer into tub/shower with tub bench and mod assist of one caregiver OT Short Term Goal 3 (Week 1): Pt will complete bathing with mod assist of one caregiver OT Short Term Goal 4 (Week 1): Pt will complete LB dressing with max assist of one caregiver OT Short Term Goal 5 (Week 1): Pt will scan to Lt visual field to locate items to complete self-care tasks with min cues and increased time  Skilled Therapeutic Interventions/Progress Updates:    Pt completed shower and dressing during session.  Total assist needed for stand pivot transfer to the shower.  Min assist initially for static sitting balance secondary to increased lean to the right side in sitting.  Pt with LOB X4 to the left side during bathing requiring max assist to self correct.  He continues to demonstrate right head turn with right gaze preference.  Therapist assisted with all rinsing of his body but he was responsible for washing his hair and his body.  Max assist also needed for setup of washcloth with soap this session secondary to limited sustained attention.  Pt needed max instructional cueing for sustained attention and sequencing for bathing.  He demonstrated internal distraction by being cold, needing re-direction to stay on task.  Total hand over hand assist for washing the RUE with use of the LUE.  Pt transferred back to the wheelchair for dressing at the sink, again with total assist for transfer.  Increased flexor tone noted in the left hamstrings with sit to stand.  Therapist assisted with all dressing secondary to decreased time  with max assist for pt to complete sit to stand in front of the sink and maintain long enough to complete pulling new brief and pants over hips.  Pt left with PT at end of session to finish grooming tasks.    Therapy Documentation Precautions:  Precautions Precautions: Fall Precaution Comments: L hemiparesis/flaccid, right head turn Restrictions Weight Bearing Restrictions: No  Pain: Pain Assessment Pain Assessment: No/denies pain Pain Type: Acute pain Pain Location: Back Pain Descriptors / Indicators: Discomfort;Penetrating Pain Intervention(s): Repositioned;Emotional support ADL: See Function Navigator for Current Functional Status.   Therapy/Group: Individual Therapy  Magdiel Bartles OTR/L 01/22/2016, 12:52 PM

## 2016-01-23 ENCOUNTER — Inpatient Hospital Stay (HOSPITAL_COMMUNITY): Payer: PRIVATE HEALTH INSURANCE | Admitting: Occupational Therapy

## 2016-01-23 ENCOUNTER — Inpatient Hospital Stay (HOSPITAL_COMMUNITY): Payer: PRIVATE HEALTH INSURANCE | Admitting: Speech Pathology

## 2016-01-23 NOTE — Progress Notes (Signed)
Speech Language Pathology Daily Session Note  Patient Details  Name: Jon CoolerGeorge D Cammarano MRN: [id number] Date of Birth: 10/04/1955  Today's Date: 01/23/2016 SLP Individual Time: 0902-1000 SLP Individual Time Calculation (min): 58 min  Short Term Goals: Week 2: SLP Short Term Goal 1 (Week 2): Pt will sustain his attention to basic tasks for 3-5 minute intervals with mod assist verbal cues for redirection.  SLP Short Term Goal 2 (Week 2): Pt will verbalize at least 2 deficits s/p CVA with min assist question cues.   SLP Short Term Goal 3 (Week 2): Pt will be intelligible at the conversational level with min assist verbal cues for overarticulation, increased vocal intensity and slow rate of speech.   SLP Short Term Goal 4 (Week 2): Pt will consume dys 3 textures and thin liquids with min assist verbal cues for use of swallowing precautions.   SLP Short Term Goal 5 (Week 2): Pt will initiate visual scanning to the left of midline during basic, famliar tasks with min assist verbal cues in 50% of opportunities.    Skilled Therapeutic Interventions: Skilled treatment session focused on addressing cognition goals. During discussion regarding deficits and current goals patient was able to identify goals for left attention and attention with Supervision level question cue.  Cues increased to Mod level question cues to identify physical deficits.  SLP also facilitated session by providing structured sustained attention and left attention scanning tasks with daily schedule and book of interest to him; patient attended to tasks for 5 minutes with Mod cues.  Patient also required Mod verbal and visual cues for left scanning on schedule and Min cues with guitar book.  Continue with current plan of care.   Function:  Cognition Comprehension Comprehension assist level: Understands basic 90% of the time/cues < 10% of the time  Expression   Expression assist level: Expresses basic 75 - 89% of the time/requires cueing  10 - 24% of the time. Needs helper to occlude trach/needs to repeat words.  Social Interaction Social Interaction assist level: Interacts appropriately 50 - 74% of the time - May be physically or verbally inappropriate.  Problem Solving Problem solving assist level: Solves basic 50 - 74% of the time/requires cueing 25 - 49% of the time  Memory Memory assist level: Recognizes or recalls 75 - 89% of the time/requires cueing 10 - 24% of the time    Pain Pain Assessment Pain Assessment: Faces Faces Pain Scale: Hurts a little bit Pain Type: Acute pain Pain Location: Arm Pain Orientation: Left Pain Onset: On-going Pain Intervention(s): Repositioned;Rest Multiple Pain Sites: No  Therapy/Group: Individual Therapy  Charlane FerrettiMelissa Annisha Baar, M.A., CCC-SLP [phone]  Edison Wollschlager 01/23/2016, 10:37 AM

## 2016-01-23 NOTE — Progress Notes (Signed)
Occupational Therapy Session Note  Patient Details  Name: Jon [name] D Wiltsey MRN: [id number] Date of Birth: 1956/03/04  Today's Date: 01/23/2016 OT Individual Time: 1425-1600 OT Individual Time Calculation (min): 95 min    Short Term Goals: Week 2:  OT Short Term Goal 1 (Week 2): Pt will complete transfer into tub/shower with tub bench and mod assist squat pivot of one caregiver. OT Short Term Goal 2 (Week 2): Pt will complete bathing with mod assist of one caregiver sit to stand. OT Short Term Goal 3 (Week 2): Pt will complete LB dressing with max assist of one caregiver sit to stand. OT Short Term Goal 4 (Week 2): Pt will complete toilet transfer with mod assist stand pivot to the 3:1. OT Short Term Goal 5 (Week 2): Pt/family will return demonstrate safe performance of PROM exercises for the LUE following handout.    Skilled Therapeutic Interventions/Progress Updates: upon initial approach for OT, patient appeared stated, "I want to get in bed and ly down."   With encouragement to complete at least one therapy task and then offer to ly down, patient concurred to participate.     He did state he needed to ly down to "urinate into this thing[catheter] and promised to get back out of bed afterwards for more therapy.     Patient participated in bed to/wc transfers x2 with Moderate assistance and cues for proper technique to his left and to his right.   He was able to stand at sink for left UE and LE weight bearing for about 5 seconds before initiating sitting back down x5.  He partiicpated in unsupported dynamic sitting balance with occasional tactile or verbal cue to "come to the middle " or "correct 'self if you are leaning"      He required moderate assistance to don a pullover shirt and admitted he had difficult with visual acuity and topographical orientation with the shirt being dark colored (black).  At the end of the session, he agreed to sit in his recliner with feet elevated rather than  getting back into bed - until after he ate dinner.  Patient's wife stated, "HE WAS A NIGHT OWL BEFORE THIS HAPPENED AND STAYED UP FROM 9PM UNTIL 4 AM."  Further the wife asked many questions regarding how to help him get proper rest and how to encourage him to sit up during the day when he does not sleep at night while at the same time giving him 'brain breaks.'     Left IV at left wrist may be a challenge for L UE weight bearing which is important for return function to the left UE  Patient left relaxing in his recliner with feet elevated with wife sitting by supervising.   This clinican instructed her to inform staff when she leaves this evening in order that a safety belt may be applied while he is sittingup in his recliner.     Therapy Documentation Precautions:  Precautions Precautions: Fall Precaution Comments: L hemiparesis/flaccid, right head turn Restrictions Weight Bearing Restrictions: No   Pain:denied    See Function Navigator for Current Functional Status.   Therapy/Group: Individual Therapy  Bud Faceickett, Talyia Allende [facility] IncYe[facility] 01/23/2016, 5:28 PM

## 2016-01-23 NOTE — Progress Notes (Signed)
Subjective/Complaints: "room is cold". Otherwise doing well. Able to sleep  Review of systems denies chest pain shortness of breath nausea vomiting diarrhea and constipation  Objective: Vital Signs: Blood pressure 135/68, pulse 66, temperature 98.4 F (36.9 C), temperature source Oral, resp. rate 18, weight 90.7 kg (199 lb 15.3 oz), SpO2 99 %. No results found. No results found for this or any previous visit (from the past 72 hour(s)).  HEENT: Normocephalic, atraumatic.  Cardio: RRR Resp: CTA B/L and unlabored GI: BS positive and mild epigastric distention and tenderness Skin:   Warm and dry.  Neuro: Confused, Cranial Nerve Abnormalities left central 7 Right facial weakness  Abnormal Sensory Decreased sensation left upper and left lower limb Abnormal Motor 0/5 left upper extremity 0/5 left lower extremity 5/5 right upper extremity 5/5  right lower extremity Tone:  Increased tone  in the left elbow flexor as well as left knee extensor Ashworth grade 3 in the lower limb and 1/4 in the left elbow flexion Musc/Skel: No edema. No tenderness.  Gen. no acute distress. Vital signs reviewed.  Psych: Mood and affect normal, distracted  Assessment/Plan: 1. Functional deficits secondary to right hemiparesis causing left hemiplegia which require 3+ hours per day of interdisciplinary therapy in a comprehensive inpatient rehab setting. Physiatrist is providing close team supervision and 24 hour management of active medical problems listed below. Physiatrist and rehab team continue to assess barriers to discharge/monitor patient progress toward functional and medical goals. FIM: Function - Bathing Position: Shower Body parts bathed by patient: Left arm, Chest, Abdomen, Front perineal area, Right upper leg, Left upper leg Body parts bathed by helper: Right lower leg, Left lower leg, Back (Pt sat on commode bench for washing peri area with lateral leans, no standing) Bathing not applicable: Front  perineal area, Buttocks, Right upper leg, Left upper leg, Right lower leg, Left lower leg (pt declined) Assist Level: 2 helpers (Max assist, 2 helpers for buttocks in standing)  Function- Upper Body Dressing/Undressing Upper body dressing/undressing activity did not occur: Refused What is the patient wearing?: Pull over shirt/dress Pull over shirt/dress - Perfomed by patient: Thread/unthread right sleeve, Put head through opening Pull over shirt/dress - Perfomed by helper: Thread/unthread left sleeve, Pull shirt over trunk, Put head through opening, Thread/unthread right sleeve (Therapist assist secondary to decreased time.) Assist Level: Touching or steadying assistance(Pt > 75%) Function - Lower Body Dressing/Undressing What is the patient wearing?: Shoes, AFO, Socks, Pants Position: Wheelchair/chair at sink (Therapist assist for all dressing secondary to decreased time.) Pants- Performed by helper: Thread/unthread right pants leg, Thread/unthread left pants leg, Pull pants up/down Non-skid slipper socks- Performed by helper: Don/doff right sock, Don/doff left sock Socks - Performed by helper: Don/doff right sock, Don/doff left sock Shoes - Performed by helper: Don/doff right shoe, Don/doff left shoe AFO - Performed by helper: Don/doff left AFO Assist for footwear: Dependant (able to doff R sock only) Assist for lower body dressing: 2 Helpers  Function - Toileting Toileting activity did not occur: No continent bowel/bladder event Toileting steps completed by helper: Adjust clothing prior to toileting, Performs perineal hygiene, Adjust clothing after toileting Toileting Assistive Devices: Grab bar or rail Assist level:  Biomedical engineer(maxA)  Function - ArchivistToilet Transfers Toilet transfer activity did not occur: Safety/medical concerns Toilet transfer assistive device: Grab bar Assist level to toilet: Maximal assist (Pt 25 - 49%/lift and lower) Assist level from toilet: Maximal assist (Pt 25 - 49%/lift  and lower)  Function - Chair/bed transfer Chair/bed transfer method:  Squat pivot Chair/bed transfer assist level: Moderate assist (Pt 50 - 74%/lift or lower) Chair/bed transfer assistive device: Armrests Chair/bed transfer details: Verbal cues for precautions/safety, Tactile cues for placement, Tactile cues for sequencing, Tactile cues for weight shifting, Verbal cues for technique  Function - Locomotion: Wheelchair Will patient use wheelchair at discharge?:  (TBD) Type: Manual Max wheelchair distance: 50 Assist Level: Touching or steadying assistance (Pt > 75%) Assist Level: Touching or steadying assistance (Pt > 75%) Wheel 150 feet activity did not occur: Safety/medical concerns Turns around,maneuvers to table,bed, and toilet,negotiates 3% grade,maneuvers on rugs and over doorsills: No Function - Locomotion: Ambulation Ambulation activity did not occur: Safety/medical concerns Assistive device: Rail in hallway Max distance: 25 Assist level: Maximal assist (Pt 25 - 49%) Assist level: Maximal assist (Pt 25 - 49%) Walk 50 feet with 2 turns activity did not occur: Safety/medical concerns Walk 150 feet activity did not occur: Safety/medical concerns Walk 10 feet on uneven surfaces activity did not occur: Safety/medical concerns  Function - Comprehension Comprehension: Auditory Comprehension assist level: Understands basic 75 - 89% of the time/ requires cueing 10 - 24% of the time  Function - Expression Expression: Verbal Expression assist level: Expresses basic 75 - 89% of the time/requires cueing 10 - 24% of the time. Needs helper to occlude trach/needs to repeat words.  Function - Social Interaction Social Interaction assist level: Interacts appropriately 50 - 74% of the time - May be physically or verbally inappropriate.  Function - Problem Solving Problem solving assist level: Solves basic 50 - 74% of the time/requires cueing 25 - 49% of the time  Function - Memory Memory  assist level: Recognizes or recalls 25 - 49% of the time/requires cueing 50 - 75% of the time Patient normally able to recall (first 3 days only): Current season  Medical Problem List and Plan: 1.Left hemiplegia   secondary to right basal ganglia hemorrhage secondary to hypertensive crisis    Continue CIR   PRAFO, splints ordered  Fluoxetine started 6/29 2.  DVT Prophylaxis/Anticoagulation: Subcutaneous heparin initiated 01/10/2016. Monitor for any bleeding episodes 3. Pain Management/headaches: Tylenol 3 as needed, Post stroke headaches start Topamax 25 twice a day, topical analgesic cream 4. Dysphagia. Dysphagia 3 thin liquid diet being tolerated. 5. Neuropsych: This patient is capable of making decisions on his own behalf. 6. Skin/Wound Care: Routine skin checks, Start Keflex for left antecubital fossa  Cellulitis, superficial 7. Fluids/Electrolytes/Nutrition: Routine I&O's   changed to lactose-free diet this is helping with abdominal discomfort  poor intake, started Megace, this may also be cognitive based--eating 25 to 30 percent  -hopefully diet upgrade will help 8. Hypertension. Norvasc 10 mg daily, Lopressor 75 mg twice a day. Monitor with increased mobility 9. History of alcohol use. Provided counseling monitor for any signs of withdrawal, CMET showed mildly elevated AST but otherwise no abnormalities. Discussed results with his wife. 10. Hyperlipidemia. Pravachol 11. Mood. Zoloft 50 mg daily. Provide emotional support 12. Urinary tract infection completed 7 days of Keflex  13. Cognitive deficits post stroke continue speech therapy  LOS (Days) 9 A FACE TO FACE EVALUATION WAS PERFORMED  SWARTZ,ZACHARY T 01/23/2016, 8:45 AM

## 2016-01-24 ENCOUNTER — Inpatient Hospital Stay (HOSPITAL_COMMUNITY): Payer: PRIVATE HEALTH INSURANCE | Admitting: Occupational Therapy

## 2016-01-24 NOTE — Progress Notes (Signed)
Occupational Therapy Session Note  Patient Details  Name: Jon [name] MRN: [id number] Date of Birth: November 19, 1955  Today's Date: 01/24/2016 OT Individual Time: 1100-1200 OT Individual Time Calculation (min): 60 min    Short Term Goals: Week 2:  OT Short Term Goal 1 (Week 2): Pt will complete transfer into tub/shower with tub bench and mod assist squat pivot of one caregiver. OT Short Term Goal 2 (Week 2): Pt will complete bathing with mod assist of one caregiver sit to stand. OT Short Term Goal 3 (Week 2): Pt will complete LB dressing with max assist of one caregiver sit to stand. OT Short Term Goal 4 (Week 2): Pt will complete toilet transfer with mod assist stand pivot to the 3:1. OT Short Term Goal 5 (Week 2): Pt/family will return demonstrate safe performance of PROM exercises for the LUE following handout.    Skilled Therapeutic Interventions/Progress Updates:    Pt seen for OT session focusing on ADL re-training and cognition. Pt in supine upon arrival, requiring encouragement for participation, however, willing to participate and pt's wife present. He transferred to EOB with assist for management of L LE. Min questioning cues for proper set-up of w/c in prep for squat pivot transfer to w/c, completed with mod A. He competed oral care seated in w/c at sink, mod-max cuing required due to perseveration during task.  UB dressing completed focusing on education of hemi-dressing techniques, requiring cues and set-up for clothing orientation.  In therapy day room, addressed cognition in [facility]. Pt able to correctly sequence days of the week and months of the year with minimal cuing for re-direction to task. He was able to sequence playing cards when presented with one suit with increased time and min cuing, however, was unable to successfully divide and sequence playing cards when presented with two suits.  Pt returned to room at end of session, left sitting in w/c in  prep for lunch. Made pt's wife aware to don QRB if she were to leave pt.   Therapy Documentation Precautions:  Precautions Precautions: Fall Precaution Comments: L hemiparesis/flaccid, right head turn Restrictions Weight Bearing Restrictions: No Pain: Pain Assessment Pain Assessment: No/denies pain  See Function Navigator for Current Functional Status.   Therapy/Group: Individual Therapy  Lewis, Rafay Dahan C 01/24/2016, 6:57 AM

## 2016-01-24 NOTE — Progress Notes (Signed)
Subjective/Complaints: No new issues. A little restless.   Review of systems denies chest pain shortness of breath nausea vomiting diarrhea and constipation  Objective: Vital Signs: Blood pressure 157/73, pulse 69, temperature 97.9 F (36.6 C), temperature source Oral, resp. rate 18, weight 90.7 kg (199 lb 15.3 oz), SpO2 98 %. No results found. No results found for this or any previous visit (from the past 72 hour(s)).  HEENT: Normocephalic, atraumatic.  Cardio: RRR Resp: CTA B/L and unlabored GI: BS positive and mild epigastric distention and tenderness Skin:   Warm and dry.  Neuro: Confused, Cranial Nerve Abnormalities left central 7 Right facial weakness  Abnormal Sensory Decreased sensation left upper and left lower limb Abnormal Motor 0/5 left upper extremity 0/5 left lower extremity 5/5 right upper extremity 5/5  right lower extremity Tone:  Increased tone  in the left elbow flexor as well as left knee extensor Ashworth grade 3 in the lower limb and 1/4 in the left elbow flexion Musc/Skel: No edema. No tenderness.  Gen. no acute distress. Vital signs reviewed.  Psych: Mood and affect normal, distracted  Assessment/Plan: 1. Functional deficits secondary to right hemiparesis causing left hemiplegia which require 3+ hours per day of interdisciplinary therapy in a comprehensive inpatient rehab setting. Physiatrist is providing close team supervision and 24 hour management of active medical problems listed below. Physiatrist and rehab team continue to assess barriers to discharge/monitor patient progress toward functional and medical goals. FIM: Function - Bathing Position: Shower Body parts bathed by patient: Left arm, Chest, Abdomen, Front perineal area, Right upper leg, Left upper leg Body parts bathed by helper: Right lower leg, Left lower leg, Back (Pt sat on commode bench for washing peri area with lateral leans, no standing) Bathing not applicable: Front perineal area,  Buttocks, Right upper leg, Left upper leg, Right lower leg, Left lower leg (pt declined) Assist Level: 2 helpers (Max assist, 2 helpers for buttocks in standing)  Function- Upper Body Dressing/Undressing Upper body dressing/undressing activity did not occur: Refused What is the patient wearing?: Pull over shirt/dress Pull over shirt/dress - Perfomed by patient: Thread/unthread right sleeve, Put head through opening Pull over shirt/dress - Perfomed by helper: Thread/unthread left sleeve, Pull shirt over trunk, Put head through opening, Thread/unthread right sleeve (Therapist assist secondary to decreased time.) Assist Level: Touching or steadying assistance(Pt > 75%) Function - Lower Body Dressing/Undressing What is the patient wearing?: Shoes, AFO, Socks, Pants Position: Wheelchair/chair at sink (Therapist assist for all dressing secondary to decreased time.) Pants- Performed by helper: Thread/unthread right pants leg, Thread/unthread left pants leg, Pull pants up/down Non-skid slipper socks- Performed by helper: Don/doff right sock, Don/doff left sock Socks - Performed by helper: Don/doff right sock, Don/doff left sock Shoes - Performed by helper: Don/doff right shoe, Don/doff left shoe AFO - Performed by helper: Don/doff left AFO Assist for footwear: Dependant (able to doff R sock only) Assist for lower body dressing: 2 Helpers  Function - Toileting Toileting activity did not occur: No continent bowel/bladder event Toileting steps completed by helper: Adjust clothing prior to toileting, Performs perineal hygiene, Adjust clothing after toileting Toileting Assistive Devices: Grab bar or rail Assist level:  Biomedical engineer(maxA)  Function - ArchivistToilet Transfers Toilet transfer activity did not occur: Safety/medical concerns Toilet transfer assistive device: Grab bar Assist level to toilet: Maximal assist (Pt 25 - 49%/lift and lower) Assist level from toilet: Maximal assist (Pt 25 - 49%/lift and  lower)  Function - Chair/bed transfer Chair/bed transfer method: Squat pivot  Chair/bed transfer assist level: Moderate assist (Pt 50 - 74%/lift or lower) Chair/bed transfer assistive device: Armrests Chair/bed transfer details: Verbal cues for precautions/safety, Tactile cues for placement, Tactile cues for sequencing, Tactile cues for weight shifting, Verbal cues for technique  Function - Locomotion: Wheelchair Will patient use wheelchair at discharge?:  (TBD) Type: Manual Max wheelchair distance: 50 Assist Level: Touching or steadying assistance (Pt > 75%) Assist Level: Touching or steadying assistance (Pt > 75%) Wheel 150 feet activity did not occur: Safety/medical concerns Turns around,maneuvers to table,bed, and toilet,negotiates 3% grade,maneuvers on rugs and over doorsills: No Function - Locomotion: Ambulation Ambulation activity did not occur: Safety/medical concerns Assistive device: Rail in hallway Max distance: 25 Assist level: Maximal assist (Pt 25 - 49%) Assist level: Maximal assist (Pt 25 - 49%) Walk 50 feet with 2 turns activity did not occur: Safety/medical concerns Walk 150 feet activity did not occur: Safety/medical concerns Walk 10 feet on uneven surfaces activity did not occur: Safety/medical concerns  Function - Comprehension Comprehension: Auditory Comprehension assist level: Understands basic 90% of the time/cues < 10% of the time  Function - Expression Expression: Verbal Expression assist level: Expresses basic 75 - 89% of the time/requires cueing 10 - 24% of the time. Needs helper to occlude trach/needs to repeat words.  Function - Social Interaction Social Interaction assist level: Interacts appropriately 50 - 74% of the time - May be physically or verbally inappropriate.  Function - Problem Solving Problem solving assist level: Solves basic 50 - 74% of the time/requires cueing 25 - 49% of the time  Function - Memory Memory assist level: Recognizes  or recalls 75 - 89% of the time/requires cueing 10 - 24% of the time Patient normally able to recall (first 3 days only): Current season  Medical Problem List and Plan: 1.Left hemiplegia   secondary to right basal ganglia hemorrhage secondary to hypertensive crisis    Continue CIR   PRAFO, splints   Fluoxetine started 6/29 2.  DVT Prophylaxis/Anticoagulation: Subcutaneous heparin initiated 01/10/2016. Monitor for any bleeding episodes 3. Pain Management/headaches: Tylenol 3 as needed, Post stroke headaches start Topamax 25 twice a day, topical analgesic cream 4. Dysphagia. Dysphagia 3 thin liquid diet being tolerated. 5. Neuropsych: This patient is capable of making decisions on his own behalf. 6. Skin/Wound Care: Routine skin checks, Start Keflex for left antecubital fossa  Cellulitis, superficial 7. Fluids/Electrolytes/Nutrition: Routine I&O's   changed to lactose-free diet- has helped with abdominal discomfort  poor intake, started Megace, this may also be cognitive based--eating 30-40 percent    8. Hypertension. Norvasc 10 mg daily, Lopressor 75 mg twice a day. Monitor with increased mobility 9. History of alcohol use. Provided counseling monitor for any signs of withdrawal, CMET showed mildly elevated AST but otherwise no abnormalities. Discussed results with his wife. 10. Hyperlipidemia. Pravachol 11. Mood. Zoloft 50 mg daily. Provide emotional support 12. Urinary tract infection completed 7 days of Keflex  13. Cognitive deficits post stroke continue speech therapy  LOS (Days) 10 A FACE TO FACE EVALUATION WAS PERFORMED  Shiree Altemus T 01/24/2016, 9:22 AM

## 2016-01-25 ENCOUNTER — Inpatient Hospital Stay (HOSPITAL_COMMUNITY): Payer: PRIVATE HEALTH INSURANCE | Admitting: Physical Therapy

## 2016-01-25 ENCOUNTER — Inpatient Hospital Stay (HOSPITAL_COMMUNITY): Payer: PRIVATE HEALTH INSURANCE | Admitting: Occupational Therapy

## 2016-01-25 ENCOUNTER — Inpatient Hospital Stay (HOSPITAL_COMMUNITY): Payer: PRIVATE HEALTH INSURANCE | Admitting: Speech Pathology

## 2016-01-25 NOTE — Progress Notes (Signed)
Occupational Therapy Session Note  Patient Details  Name: Jon [name] MRN: [id number] Date of Birth: 05-Aug-1955  Today's Date: 01/25/2016 OT Individual Time: [id number] and 1330-1409 OT Individual Time Calculation (min): 60 min and 39 min   Short Term Goals: Week 2:  OT Short Term Goal 1 (Week 2): Pt will complete transfer into tub/shower with tub bench and mod assist squat pivot of one caregiver. OT Short Term Goal 2 (Week 2): Pt will complete bathing with mod assist of one caregiver sit to stand. OT Short Term Goal 3 (Week 2): Pt will complete LB dressing with max assist of one caregiver sit to stand. OT Short Term Goal 4 (Week 2): Pt will complete toilet transfer with mod assist stand pivot to the 3:1. OT Short Term Goal 5 (Week 2): Pt/family will return demonstrate safe performance of PROM exercises for the LUE following handout.    Skilled Therapeutic Interventions/Progress Updates:    1) Treatment session with focus on functional transfers, Lt attention, and sequencing during self-care tasks.  Pt received in bed willing to participate in treatment session.  Increased time to initiate and sequence transfer into w/c, requiring mod assist for transfer and blocking at LLE.  Pt initially receptive to shower but then refusing once in bathroom, reporting need to toilet.  Max assist squat pivot transfer to toilet, noted improved trunk control and sitting balance during toileting.  Mod assist sit > stand with tactile cues for positioning of LLE, noted increased tone in LLE with sit > stand requiring manual facilitation to increase positioning.  Bathing completed at sit > stand level at sink with pt requiring increased time and cues for orientation and sequencing of donning shirt.  Pt demonstrating increased spontaneous scanning to Lt with therapist positioned on patient's left.  Oral care completed in sitting at sink with setup for items.  Pt's wife present throughout session and provided therapist  with measurements of doorways and heights of seats in home.  2) Treatent session with focus on sitting balance, attention to task, Lt attention, and functional transfers.  Pt received reclined in bed, with wife reporting pt having not eaten lunch yet.  Engaged in bed mobility to come to sitting at EOB to engage in self-feeding activity.  Therapist positioned on pt's Lt to promote scanning to Lt environment while providing min to supervision for sitting balance.  Pt demonstrating short periods of sustained attention before losing attention to task or making a joke.  Able to scan to Lt of tray to locate utensils and engage in self-feeding.  Mod-max cues to wipe Lt side of face to clear residue.  Pt reported need to toilet.  Performed squat pivot transfers to w/c and toilet with mod-max assist and mod cues for sequencing due to impulsivity and decreased safety awareness.  Therapy Documentation Precautions:  Precautions Precautions: Fall Precaution Comments: L hemiparesis/flaccid, right head turn Restrictions Weight Bearing Restrictions: No General:   Vital Signs:   Pain: Pain Assessment Pain Assessment: No/denies pain  See Function Navigator for Current Functional Status.   Therapy/Group: Individual Therapy  Loyed Wilmes 01/25/2016, 10:10 AM

## 2016-01-25 NOTE — Progress Notes (Signed)
Subjective/Complaints: Pt working with therapies.  He has questions about indwelling foley and UTI as does his wife.   Review of systems: denies chest pain shortness of breath nausea vomiting diarrhea and constipation  Objective: Vital Signs: Blood pressure 137/69, pulse 70, temperature 98.4 F (36.9 C), temperature source Oral, resp. rate 18, weight 90.7 kg (199 lb 15.3 oz), SpO2 98 %. No results found. No results found for this or any previous visit (from the past 72 hour(s)).  HEENT: Normocephalic, atraumatic.  Cardio: RRR Resp: CTA B/L and unlabored GI: BS positive and mild epigastric distention and tenderness Skin:   Warm and dry.  Neuro:Cranial Nerve Abnormalities left central 7 Right facial weakness  Abnormal Sensory Decreased sensation left upper and left lower limb Abnormal Motor 0/5 left upper extremity 0/5 left lower extremity 5/5 right upper extremity 5/5  right lower extremity Tone:  left elbow flexor 1+/4, left finger flexors 1/4 as well left knee flexors Ashworth grade 3/4  Musc/Skel: No edema. No tenderness.  Gen. no acute distress. Vital signs reviewed.  Psych: Mood and affect normal, distracted  Assessment/Plan: 1. Functional deficits secondary to right hemiparesis causing left hemiplegia which require 3+ hours per day of interdisciplinary therapy in a comprehensive inpatient rehab setting. Physiatrist is providing close team supervision and 24 hour management of active medical problems listed below. Physiatrist and rehab team continue to assess barriers to discharge/monitor patient progress toward functional and medical goals. FIM: Function - Bathing Position: Shower Body parts bathed by patient: Left arm, Chest, Abdomen, Front perineal area, Right upper leg, Left upper leg Body parts bathed by helper: Right lower leg, Left lower leg, Back (Pt sat on commode bench for washing peri area with lateral leans, no standing) Bathing not applicable: Front perineal  area, Buttocks, Right upper leg, Left upper leg, Right lower leg, Left lower leg (pt declined) Assist Level: 2 helpers (Max assist, 2 helpers for buttocks in standing)  Function- Upper Body Dressing/Undressing Upper body dressing/undressing activity did not occur: Refused What is the patient wearing?: Pull over shirt/dress Pull over shirt/dress - Perfomed by patient: Thread/unthread left sleeve, Put head through opening, Pull shirt over trunk Pull over shirt/dress - Perfomed by helper: Thread/unthread right sleeve Assist Level: Touching or steadying assistance(Pt > 75%) Function - Lower Body Dressing/Undressing What is the patient wearing?: Shoes, AFO, Socks, Pants Position: Wheelchair/chair at sink (Therapist assist for all dressing secondary to decreased time.) Pants- Performed by helper: Thread/unthread right pants leg, Thread/unthread left pants leg, Pull pants up/down Non-skid slipper socks- Performed by helper: Don/doff right sock, Don/doff left sock Socks - Performed by helper: Don/doff right sock, Don/doff left sock Shoes - Performed by helper: Don/doff right shoe, Don/doff left shoe AFO - Performed by helper: Don/doff left AFO Assist for footwear: Dependant (able to doff R sock only) Assist for lower body dressing: 2 Helpers  Function - Toileting Toileting activity did not occur: No continent bowel/bladder event Toileting steps completed by helper: Adjust clothing prior to toileting, Performs perineal hygiene, Adjust clothing after toileting Toileting Assistive Devices: Grab bar or rail Assist level:  Biomedical engineer(maxA)  Function - ArchivistToilet Transfers Toilet transfer activity did not occur: Safety/medical concerns Toilet transfer assistive device: Grab bar Assist level to toilet: Maximal assist (Pt 25 - 49%/lift and lower) Assist level from toilet: Maximal assist (Pt 25 - 49%/lift and lower)  Function - Chair/bed transfer Chair/bed transfer method: Squat pivot Chair/bed transfer assist  level: Moderate assist (Pt 50 - 74%/lift or lower) Chair/bed transfer assistive  device: Armrests Chair/bed transfer details: Verbal cues for precautions/safety, Tactile cues for placement, Tactile cues for sequencing, Tactile cues for weight shifting, Verbal cues for technique  Function - Locomotion: Wheelchair Will patient use wheelchair at discharge?:  (TBD) Type: Manual Max wheelchair distance: 50 Assist Level: Touching or steadying assistance (Pt > 75%) Assist Level: Touching or steadying assistance (Pt > 75%) Wheel 150 feet activity did not occur: Safety/medical concerns Turns around,maneuvers to table,bed, and toilet,negotiates 3% grade,maneuvers on rugs and over doorsills: No Function - Locomotion: Ambulation Ambulation activity did not occur: Safety/medical concerns Assistive device: Rail in hallway Max distance: 25 Assist level: Maximal assist (Pt 25 - 49%) Assist level: Maximal assist (Pt 25 - 49%) Walk 50 feet with 2 turns activity did not occur: Safety/medical concerns Walk 150 feet activity did not occur: Safety/medical concerns Walk 10 feet on uneven surfaces activity did not occur: Safety/medical concerns  Function - Comprehension Comprehension: Auditory Comprehension assist level: Understands basic 90% of the time/cues < 10% of the time  Function - Expression Expression: Verbal Expression assist level: Expresses basic 75 - 89% of the time/requires cueing 10 - 24% of the time. Needs helper to occlude trach/needs to repeat words.  Function - Social Interaction Social Interaction assist level: Interacts appropriately 50 - 74% of the time - May be physically or verbally inappropriate.  Function - Problem Solving Problem solving assist level: Solves basic 50 - 74% of the time/requires cueing 25 - 49% of the time  Function - Memory Memory assist level: Recognizes or recalls 75 - 89% of the time/requires cueing 10 - 24% of the time Patient normally able to recall (first  3 days only): Current season  Medical Problem List and Plan: 1.Left hemiplegia   secondary to right basal ganglia hemorrhage secondary to hypertensive crisis    Continue CIR   PRAFO, splints   Fluoxetine started 6/29 2.  DVT Prophylaxis/Anticoagulation: Subcutaneous heparin initiated 01/10/2016. Monitor for any bleeding episodes 3. Pain Management/headaches: Tylenol 3 as needed, Post stroke headaches started Topamax 25 twice a day, topical analgesic cream 4. Dysphagia. Dysphagia 3 thin liquid diet being tolerated. 5. Neuropsych: This patient is not fully capable of making decisions on his own behalf. 6. Skin/Wound Care: Routine skin checks, Completed Keflex for left antecubital fossa cellulitis, superficial 7. Fluids/Electrolytes/Nutrition: Routine I&O's   changed to lactose-free diet- has helped with abdominal discomfort  poor intake, started Megace, this may also be cognitive based--eating 10-50 percent of meals 8. Hypertension. Norvasc 10 mg daily, Lopressor 75 mg twice a day. Monitor with increased mobility 9. History of alcohol use. Provided counseling monitor for any signs of withdrawal, CMET showed mildly elevated AST but otherwise no abnormalities. Discussed results with his wife. 10. Hyperlipidemia. Pravachol 11. Mood. Zoloft 50 mg daily. Provide emotional support 12. Urinary tract infection completed 7 days of Keflex  13. Cognitive deficits post stroke continue speech therapy  LOS (Days) 11 A FACE TO FACE EVALUATION WAS PERFORMED  Ankit Karis Jubanil Patel 01/25/2016, 9:31 AM

## 2016-01-25 NOTE — Progress Notes (Signed)
Physical Therapy Session Note  Patient Details  Name: Jon [name] MRN: [id number] Date of Birth: Dec 07, 1955  Today's Date: 01/25/2016 PT Individual Time: 0900-1000 PT Individual Time Calculation (min): 60 min   Short Term Goals: Week 2:  PT Short Term Goal 1 (Week 2): Pt will perform transfers w/c <>bed with minA PT Short Term Goal 2 (Week 2): Pt will perform bed mobility with S PT Short Term Goal 3 (Week 2): Pt will ambulate 1225' with modA and LRAD PT Short Term Goal 4 (Week 2): Pt will propel w/c minA with R hemi technique PT Short Term Goal 5 (Week 2): Pt will initiate stair training  Skilled Therapeutic Interventions/Progress Updates:    Pt received seated in w/c; denies pain and agreeable to treatment. Transfer w/c <>mat table minA to R, modA to L side. Sit <>stand minA from mat table x5 trials. Standing RLE toe taps to 1" step to facilitate L weight bearing, stance control, weight shift and requires maxA for LLE to prevent buckling/hyperextension. Gait 5x25' with R rail in hall, maxA for LLE progression and stance control, mirror visual feedback for upright posture and forward gaze to A with trunk/hip extension. Requires max encouragement and cueing throughout session for participation due to poor attention, awareness, motivation and understanding of deficits. W/c propulsion x50' with S and improving coordination of RLE for navigation; continues to require max cueing due to sustained attention deficits, L inattention. Remained seated in w/c at completion of session, quick release belt intact and all needs in reach with wife present.   Therapy Documentation Precautions:  Precautions Precautions: Fall Precaution Comments: L hemiparesis/flaccid, right head turn Restrictions Weight Bearing Restrictions: No Pain: Pain Assessment Pain Assessment: No/denies pain   See Function Navigator for Current Functional Status.   Therapy/Group: Individual Therapy  Vista Lawmanlizabeth J  Tygielski 01/25/2016, 9:57 AM

## 2016-01-25 NOTE — Progress Notes (Signed)
Speech Language Pathology Daily Session Note  Patient Details  Name: Jon [name] MRN: [id number] Date of Birth: May 30, 1956  Today's Date: 01/25/2016 SLP Individual Time: 1004-1100 SLP Individual Time Calculation (min): 56 min  Short Term Goals: Week 2: SLP Short Term Goal 1 (Week 2): Pt will sustain his attention to basic tasks for 3-5 minute intervals with mod assist verbal cues for redirection.  SLP Short Term Goal 2 (Week 2): Pt will verbalize at least 2 deficits s/p CVA with min assist question cues.   SLP Short Term Goal 3 (Week 2): Pt will be intelligible at the conversational level with min assist verbal cues for overarticulation, increased vocal intensity and slow rate of speech.   SLP Short Term Goal 4 (Week 2): Pt will consume dys 3 textures and thin liquids with min assist verbal cues for use of swallowing precautions.   SLP Short Term Goal 5 (Week 2): Pt will initiate visual scanning to the left of midline during basic, famliar tasks with min assist verbal cues in 50% of opportunities.    Skilled Therapeutic Interventions:  Pt was seen for skilled ST targeting cognitive goals.  Pt lethargic upon arrival but amenable to participating in ST without need for encouragement.  Pt's wife reported that pt had not had time to eat breakfast this morning.  Pt fed himself 50% of a container of yogurt and 50% of an Ensure nutritional shake with min assist verbal cues for redirection to task after ~2-3 minute intervals.  SLP facilitated the session with a basic, novel card game targeting visual scanning to the left of midline and sustained attention to task.  Pt required overall min assist verbal cues to attend to cards on the left side during task and was able to sustain his attention to task for ~3 minutes with min verbal cues for redirection.  Pt required max assist verbal cues to recognize and correct perseverative errors during task. Pt was returned to room and transferred back to bed at the end  of today's therapy session.  Wife educated regarding current goals and progress in therapies, including techniques to maximize attention to the left and sustained attention to tasks.  Pt left in bed with call bell within reach and bed alarm set.  Continue per current plan of care.    Function:  Eating Eating   Modified Consistency Diet: Yes Eating Assist Level: Supervision or verbal cues;Help managing cup/glass;Set up assist for   Eating Set Up Assist For: Opening containers       Cognition Comprehension Comprehension assist level: Understands basic 90% of the time/cues < 10% of the time  Expression   Expression assist level: Expresses basic 90% of the time/requires cueing < 10% of the time.  Social Interaction Social Interaction assist level: Interacts appropriately 75 - 89% of the time - Needs redirection for appropriate language or to initiate interaction.  Problem Solving Problem solving assist level: Solves basic 50 - 74% of the time/requires cueing 25 - 49% of the time  Memory Memory assist level: Recognizes or recalls 75 - 89% of the time/requires cueing 10 - 24% of the time    Pain Pain Assessment Pain Assessment: No/denies pain  Therapy/Group: Individual Therapy  Adley Mazurowski, Melanee SpryNicole L 01/25/2016, 12:47 PM

## 2016-01-26 ENCOUNTER — Inpatient Hospital Stay (HOSPITAL_COMMUNITY): Payer: PRIVATE HEALTH INSURANCE | Admitting: Speech Pathology

## 2016-01-26 ENCOUNTER — Inpatient Hospital Stay (HOSPITAL_COMMUNITY): Payer: PRIVATE HEALTH INSURANCE | Admitting: Occupational Therapy

## 2016-01-26 ENCOUNTER — Inpatient Hospital Stay (HOSPITAL_COMMUNITY): Payer: PRIVATE HEALTH INSURANCE | Admitting: Physical Therapy

## 2016-01-26 DIAGNOSIS — M6249 Contracture of muscle, multiple sites: Secondary | ICD-10-CM

## 2016-01-26 DIAGNOSIS — M62838 Other muscle spasm: Secondary | ICD-10-CM | POA: Insufficient documentation

## 2016-01-26 MED ORDER — BACLOFEN 5 MG HALF TABLET
5.0000 mg | ORAL_TABLET | Freq: Three times a day (TID) | ORAL | Status: DC
  Administered 2016-01-26 – 2016-01-27 (×4): 5 mg via ORAL
  Filled 2016-01-26 (×4): qty 1

## 2016-01-26 NOTE — Progress Notes (Signed)
Physical Therapy Session Note  Patient Details  Name: Jon [name] MRN: [id number] Date of Birth: [birth year]  Today's Date: 01/26/2016 PT Individual Time: 1406-1506 PT Individual Time Calculation (min): 60 min   Short Term Goals: Week 2:  PT Short Term Goal 1 (Week 2): Pt will perform transfers w/c <>bed with minA PT Short Term Goal 2 (Week 2): Pt will perform bed mobility with S PT Short Term Goal 3 (Week 2): Pt will ambulate 29' with modA and LRAD PT Short Term Goal 4 (Week 2): Pt will propel w/c minA with R hemi technique PT Short Term Goal 5 (Week 2): Pt will initiate stair training  Skilled Therapeutic Interventions/Progress Updates:    Pt received resting in bed, no c/o pain but reports fatigue, agreeable to therapy session with encouragement.  Session focus on transfers, w/c propulsion, problem solving, NMR, and gait training.    Pt transition supine>sit with mod assist using bed rails with facilitation for trunk.  Squat/pivot into w/c with max assist for lifting/lowering and to control trajectory.  Attempted w/c propulsion but pt continually running w/c into trash can on left side and unable to problem solve coordination for R hemi technique in non-controlled setting of his room.  Pt requesting to toilet, PT propelled w/c into bathroom for urgency.  Pt performed max assist squat/pivot to and from toilet with total assist for clothing management.    Pt taken to therapy gym and transferred onto therapy mat with max assist for squat/pivot, though with noted improved control over head/hips relationship when transferring to R side.  Pt transitioned to supine on mat with min assist.  PT instructed pt in x10 reps of bridging and 2x15 reps of bridging with ball squeeze for NMR and strengthening.    Gait training x30' with R rail in hallway and max assist to support upright posture and advance LLE.  Max verbal cues for sequencing, "L foot, R hand, R foot."  Pt declining further therapy at this  time, reporting fatigue.  Pt returned to room and transitioned back to bed with mod assist for squat/pivot to the R, sit>supine with supervision using RLE to hook LLE.  Pt positioned to comfort with call bell in reach and needs met.   Therapy Documentation Precautions:  Precautions Precautions: Fall Precaution Comments: L hemiparesis/flaccid, right head turn Restrictions Weight Bearing Restrictions: No General: PT Amount of Missed Time (min): 15 Minutes PT Missed Treatment Reason: Patient fatigue   See Function Navigator for Current Functional Status.   Therapy/Group: Individual Therapy  Railynn Ballo E Penven-Crew 01/26/2016, 4:02 PM

## 2016-01-26 NOTE — Progress Notes (Signed)
Speech Language Pathology Daily Session Note  Patient Details  Name: Tawanna CoolerGeorge D Gilberti MRN: [id number] Date of Birth: 10-20-1955  Today's Date: 01/26/2016 SLP Individual Time: 1102-1202 SLP Individual Time Calculation (min): 60 min  Short Term Goals: Week 2: SLP Short Term Goal 1 (Week 2): Pt will sustain his attention to basic tasks for 3-5 minute intervals with mod assist verbal cues for redirection.  SLP Short Term Goal 2 (Week 2): Pt will verbalize at least 2 deficits s/p CVA with min assist question cues.   SLP Short Term Goal 3 (Week 2): Pt will be intelligible at the conversational level with min assist verbal cues for overarticulation, increased vocal intensity and slow rate of speech.   SLP Short Term Goal 4 (Week 2): Pt will consume dys 3 textures and thin liquids with min assist verbal cues for use of swallowing precautions.   SLP Short Term Goal 5 (Week 2): Pt will initiate visual scanning to the left of midline during basic, famliar tasks with min assist verbal cues in 50% of opportunities.    Skilled Therapeutic Interventions:  Pt was seen for skilled ST targeting cognitive goals.  Therapist utilized the [employer] to address visual scanning to the left of midline and sustained attention to task.  On first trial with light board set to mode A, pt was able to identify 22 targets in 1 minute.  Reaction time in upper left quadrant was 3.36 seconds, lower left quadrant was 3.98 seconds in comparison to 1.76 and 1.34 seconds in upper and lower right quadrants respectively.  With repeated trials pt was able to improve reaction time on the left to 1.62 seconds in upper left quadrant, 3.23 seconds in lower left quadrant, 1.14 seconds in upper right quadrant, 1.25 seconds in lower right quadrant.  Therapist increased task complexity by incorporating time constraints to address initiation and processing speed.  On initial trial, pt was 58% accurate for target identification in the upper left quadrant  and 69% accurate in the lower left quadrant versus 86% and 91% accurate in upper and lower right quadrants respectively; average reaction time was 1.32 seconds.  With repeated trials, pt improved accuracy to 69% and 58% accuracy in the upper and lower left quadrants respectively and 100% accuracy in both upper and lower right quadrants.  Reaction time overall improved to 1.19 seconds.  Throughout task, pt benefited from min assist verbal cues to sustain his attention to task for >30 second intervals.  Pt was returned to room at the end of today's therapy session and left in bed with bed alarm set with call bell within reach and wife at bedside.  Continue per current plan of care.    Function:  Eating Eating                 Cognition Comprehension Comprehension assist level: Understands basic 90% of the time/cues < 10% of the time  Expression   Expression assist level: Expresses basic 90% of the time/requires cueing < 10% of the time.  Social Interaction Social Interaction assist level: Interacts appropriately 75 - 89% of the time - Needs redirection for appropriate language or to initiate interaction.  Problem Solving Problem solving assist level: Solves basic 50 - 74% of the time/requires cueing 25 - 49% of the time  Memory Memory assist level: Recognizes or recalls 75 - 89% of the time/requires cueing 10 - 24% of the time    Pain Pain Assessment Pain Assessment: No/denies pain   Therapy/Group: Individual Therapy  Quantel Mcinturff, Melanee SpryNicole L 01/26/2016, 12:28 PM

## 2016-01-26 NOTE — Progress Notes (Signed)
Subjective/Complaints: Pt laying in bed.  He slept well overnight.  His wife has some questions when discussing anti-spasticity meds.   Review of systems: denies chest pain shortness of breath nausea vomiting diarrhea and constipation  Objective: Vital Signs: Blood pressure 127/76, pulse 64, temperature 97.8 F (36.6 C), temperature source Oral, resp. rate 18, weight 90.7 kg (199 lb 15.3 oz), SpO2 97 %. No results found. No results found for this or any previous visit (from the past 72 hour(s)).  HEENT: Normocephalic, atraumatic.  Cardio: RRR Resp: CTA B/L and unlabored GI: BS positive and mild epigastric distention and tenderness Skin:   Warm and dry.  Neuro:Cranial Nerve Abnormalities left central 7 Right facial weakness  Abnormal Sensory Decreased sensation left upper and left lower limb Abnormal Motor 0/5 left upper extremity 0/5 left lower extremity 5/5 right upper extremity 5/5  right lower extremity Tone:  left elbow flexor 2/4, left finger flexors 1/4 as well left knee flexors Ashworth grade 3/4  Musc/Skel: No edema. No tenderness.  Gen. no acute distress. Vital signs reviewed.  Psych: Mood and affect normal, distracted  Assessment/Plan: 1. Functional deficits secondary to right hemiparesis causing left hemiplegia which require 3+ hours per day of interdisciplinary therapy in a comprehensive inpatient rehab setting. Physiatrist is providing close team supervision and 24 hour management of active medical problems listed below. Physiatrist and rehab team continue to assess barriers to discharge/monitor patient progress toward functional and medical goals. FIM: Function - Bathing Position: Wheelchair/chair at sink Body parts bathed by patient: Left arm, Chest, Abdomen, Front perineal area, Right upper leg, Left upper leg Body parts bathed by helper: Right arm, Buttocks, Back Bathing not applicable: Left lower leg, Right lower leg Assist Level: Touching or steadying  assistance(Pt > 75%) (Mod assist)  Function- Upper Body Dressing/Undressing Upper body dressing/undressing activity did not occur: Refused What is the patient wearing?: Pull over shirt/dress Pull over shirt/dress - Perfomed by patient: Put head through opening, Pull shirt over trunk Pull over shirt/dress - Perfomed by helper: Thread/unthread right sleeve, Thread/unthread left sleeve Assist Level:  (Mod assist) Function - Lower Body Dressing/Undressing What is the patient wearing?: Non-skid slipper socks, Pants Position: Wheelchair/chair at sink Pants- Performed by helper: Thread/unthread right pants leg, Thread/unthread left pants leg, Pull pants up/down Non-skid slipper socks- Performed by helper: Don/doff right sock, Don/doff left sock Socks - Performed by helper: Don/doff right sock, Don/doff left sock Shoes - Performed by helper: Don/doff right shoe, Don/doff left shoe AFO - Performed by helper: Don/doff left AFO Assist for footwear: Dependant Assist for lower body dressing:  (Total assist)  Function - Toileting Toileting activity did not occur: No continent bowel/bladder event Toileting steps completed by helper: Adjust clothing prior to toileting, Performs perineal hygiene, Adjust clothing after toileting Toileting Assistive Devices: Grab bar or rail Assist level:  (Total assist)  Function - ArchivistToilet Transfers Toilet transfer activity did not occur: Safety/medical concerns Toilet transfer assistive device: Grab bar Assist level to toilet: Maximal assist (Pt 25 - 49%/lift and lower) Assist level from toilet: Maximal assist (Pt 25 - 49%/lift and lower)  Function - Chair/bed transfer Chair/bed transfer method: Squat pivot Chair/bed transfer assist level: Moderate assist (Pt 50 - 74%/lift or lower) Chair/bed transfer assistive device: Armrests Chair/bed transfer details: Verbal cues for precautions/safety, Tactile cues for placement, Tactile cues for sequencing, Tactile cues for  weight shifting, Verbal cues for technique  Function - Locomotion: Wheelchair Will patient use wheelchair at discharge?:  (TBD) Type: Manual Max wheelchair  distance: 50 Assist Level: Supervision or verbal cues Assist Level: Supervision or verbal cues Wheel 150 feet activity did not occur: Safety/medical concerns Turns around,maneuvers to table,bed, and toilet,negotiates 3% grade,maneuvers on rugs and over doorsills: No Function - Locomotion: Ambulation Ambulation activity did not occur: Safety/medical concerns Assistive device: Rail in hallway Max distance: 25 Assist level: Maximal assist (Pt 25 - 49%) Assist level: Maximal assist (Pt 25 - 49%) Walk 50 feet with 2 turns activity did not occur: Safety/medical concerns Walk 150 feet activity did not occur: Safety/medical concerns Walk 10 feet on uneven surfaces activity did not occur: Safety/medical concerns  Function - Comprehension Comprehension: Auditory Comprehension assist level: Understands basic 90% of the time/cues < 10% of the time  Function - Expression Expression: Verbal Expression assist level: Expresses basic 90% of the time/requires cueing < 10% of the time.  Function - Social Interaction Social Interaction assist level: Interacts appropriately 75 - 89% of the time - Needs redirection for appropriate language or to initiate interaction.  Function - Problem Solving Problem solving assist level: Solves basic 50 - 74% of the time/requires cueing 25 - 49% of the time  Function - Memory Memory assist level: Recognizes or recalls 75 - 89% of the time/requires cueing 10 - 24% of the time Patient normally able to recall (first 3 days only): Current season  Medical Problem List and Plan: 1.Left hemiplegia secondary to right basal ganglia hemorrhage secondary to hypertensive crisis    Continue CIR   PRAFO, splints  2.  DVT Prophylaxis/Anticoagulation: Subcutaneous heparin initiated 01/10/2016. Monitor for any bleeding  episodes 3. Pain Management/headaches: Tylenol 3 as needed, Post stroke headaches started Topamax 25 twice a day, topical analgesic cream 4. Dysphagia. Dysphagia 3 thin liquid diet being tolerated. 5. Neuropsych: This patient is not fully capable of making decisions on his own behalf. 6. Skin/Wound Care: Routine skin checks, Completed Keflex for left antecubital fossa cellulitis, superficial 7. Fluids/Electrolytes/Nutrition: Routine I&O's   changed to lactose-free diet- has helped with abdominal discomfort  poor intake, started Megace, this may also be cognitive based--eating 30-100% of meals 8. Hypertension. Norvasc 10 mg daily, Lopressor 75 mg twice a day. Monitor with increased mobility 9. History of alcohol use.  10. Hyperlipidemia. Pravachol 11. Mood. Zoloft 50 mg daily. Provide emotional support 12. Urinary tract infection completed 7 days of Keflex  13. Cognitive deficits post stroke continue speech therapy 14. Spasticity  Cont tizanidine TID  Baclofen 5mg  TID started on 7/4    LOS (Days) 12 A FACE TO FACE EVALUATION WAS PERFORMED  Tieara Flitton Karis JubaAnil Quinnlan Abruzzo 01/26/2016, 9:06 AM

## 2016-01-26 NOTE — Progress Notes (Signed)
Occupational Therapy Session Note  Patient Details  Name: Jon [name] MRN: [id number] Date of Birth: 09/26/1955  Today's Date: 01/26/2016 OT Individual Time: [id number] OT Individual Time Calculation (min): 58 min    Short Term Goals: Week 2:  OT Short Term Goal 1 (Week 2): Pt will complete transfer into tub/shower with tub bench and mod assist squat pivot of one caregiver. OT Short Term Goal 2 (Week 2): Pt will complete bathing with mod assist of one caregiver sit to stand. OT Short Term Goal 3 (Week 2): Pt will complete LB dressing with max assist of one caregiver sit to stand. OT Short Term Goal 4 (Week 2): Pt will complete toilet transfer with mod assist stand pivot to the 3:1. OT Short Term Goal 5 (Week 2): Pt/family will return demonstrate safe performance of PROM exercises for the LUE following handout.    Skilled Therapeutic Interventions/Progress Updates:    Treatment session with focus on functional transfers, sit > stand, Lt attention, and sequencing during self-care tasks.  Pt received upright in w/c upon arrival requesting to return to bed to urinate.  Engaged in discussion regarding goals to increase independence with toileting, d/c of condom catheter during daytime, and toilet transfers.  Pt required increased encouragement to attempt voiding on toilet vs in bed.  Mod assist stand pivot transfer to toilet with pt demonstrating increased control and fluidity with sit > stand, requiring assist to lower to toilet.  Pt continent of urine on toilet.  Squat pivot transfer back to w/c with mod assist and again increased motor control during transfer.  Pt refused shower this session, but reports willing to shower tomorrow.  Therapist wrote this on his board as a reminder for tomorrow.  Bathing and dressing completed at sit > stand level with pt demonstrating increased standing balance and trunk control and therefore able to pull shorts down prior to toileting and attempted to pull shorts up  during dressing.  Therapist assisted with pulling pants over Lt hip while engaging in LUE WB on sink during standing.  Engaged in multiple sit > stand with focus on motor control and upright standing posture.  Therapy Documentation Precautions:  Precautions Precautions: Fall Precaution Comments: L hemiparesis/flaccid, right head turn Restrictions Weight Bearing Restrictions: No Pain: Pain Assessment Pain Type: Acute pain Pain Location: Head Pain Descriptors / Indicators: Headache Pain Intervention(s): Medication (See eMAR)  See Function Navigator for Current Functional Status.   Therapy/Group: Individual Therapy  [name], Obed Samek 01/26/2016, 9:59 AM

## 2016-01-27 ENCOUNTER — Inpatient Hospital Stay (HOSPITAL_COMMUNITY): Payer: PRIVATE HEALTH INSURANCE | Admitting: Occupational Therapy

## 2016-01-27 ENCOUNTER — Inpatient Hospital Stay (HOSPITAL_COMMUNITY): Payer: PRIVATE HEALTH INSURANCE | Admitting: Physical Therapy

## 2016-01-27 ENCOUNTER — Inpatient Hospital Stay (HOSPITAL_COMMUNITY): Payer: PRIVATE HEALTH INSURANCE | Admitting: Speech Pathology

## 2016-01-27 ENCOUNTER — Encounter (HOSPITAL_COMMUNITY): Payer: PRIVATE HEALTH INSURANCE

## 2016-01-27 LAB — BASIC METABOLIC PANEL
Anion gap: 11 (ref 5–15)
BUN: 14 mg/dL (ref 6–20)
CALCIUM: 10 mg/dL (ref 8.9–10.3)
CO2: 23 mmol/L (ref 22–32)
CREATININE: 0.77 mg/dL (ref 0.61–1.24)
Chloride: 106 mmol/L (ref 101–111)
Glucose, Bld: 98 mg/dL (ref 65–99)
Potassium: 3.9 mmol/L (ref 3.5–5.1)
SODIUM: 140 mmol/L (ref 135–145)

## 2016-01-27 LAB — CBC WITH DIFFERENTIAL/PLATELET
Basophils Absolute: 0.1 10*3/uL (ref 0.0–0.1)
Basophils Relative: 1 %
Eosinophils Absolute: 0.2 10*3/uL (ref 0.0–0.7)
Eosinophils Relative: 2 %
HEMATOCRIT: 45.4 % (ref 39.0–52.0)
HEMOGLOBIN: 15 g/dL (ref 13.0–17.0)
Lymphocytes Relative: 24 %
Lymphs Abs: 2.1 10*3/uL (ref 0.7–4.0)
MCH: 34.1 pg — AB (ref 26.0–34.0)
MCHC: 33 g/dL (ref 30.0–36.0)
MCV: 103.2 fL — ABNORMAL HIGH (ref 78.0–100.0)
Monocytes Absolute: 0.6 10*3/uL (ref 0.1–1.0)
Monocytes Relative: 7 %
NEUTROS PCT: 66 %
Neutro Abs: 5.8 10*3/uL (ref 1.7–7.7)
Platelets: 270 10*3/uL (ref 150–400)
RBC: 4.4 MIL/uL (ref 4.22–5.81)
RDW: 11.9 % (ref 11.5–15.5)
WBC: 8.7 10*3/uL (ref 4.0–10.5)

## 2016-01-27 MED ORDER — BACLOFEN 10 MG PO TABS
20.0000 mg | ORAL_TABLET | Freq: Three times a day (TID) | ORAL | Status: DC
  Administered 2016-01-27 – 2016-01-30 (×11): 20 mg via ORAL
  Filled 2016-01-27 (×11): qty 2

## 2016-01-27 NOTE — Plan of Care (Signed)
Problem: RH COGNITION-NURSING Goal: RH STG USES MEMORY AIDS/STRATEGIES W/ASSIST TO PROBLEM SOLVE STG Uses Memory Aids/Strategies With min Assistance to Problem Solve.  Outcome: Not Progressing Patient requires mod to max assist with simple tasks at times.

## 2016-01-27 NOTE — Progress Notes (Signed)
Physical Therapy Session Note  Patient Details  Name: Jon CoolerGeorge D [name] MRN: [id number] Date of Birth: 1956-05-28  Today's Date: 01/27/2016 PT Individual Time: [id number] PT Individual Time Calculation (min): 57 min   Short Term Goals: Week 2:  PT Short Term Goal 1 (Week 2): Pt will perform transfers w/c <>bed with minA PT Short Term Goal 2 (Week 2): Pt will perform bed mobility with S PT Short Term Goal 3 (Week 2): Pt will ambulate 5125' with modA and LRAD PT Short Term Goal 4 (Week 2): Pt will propel w/c minA with R hemi technique PT Short Term Goal 5 (Week 2): Pt will initiate stair training  Skilled Therapeutic Interventions/Progress Updates:    Pt received seated in w/c with wife present; denies pain and agreeable to treatment. W/c propulsion 1x100, 1x75' with R hemi technique and minA with hand over foot initially to improve coordination of RLE for navigation, improved to S with min/mod verbal cues for L attention and technique. Gait 1x25' with R rail and maxA; note improving LLE stance control with only occasional buckling and able to prevent full buckle/LOB. Two trials gait with HW x20' and x15' with maxA again to progress LLE but improving stance control. Fluctuating tone in LLE with some intention tone and occasional flaccidity during LLE progression, likely based on pt initiation/attention. Car transfer x2 trials with wife providing maxA faded to modA; +2 required on first transfer to L side due to pt's LEs buckling and improper foot placement. Returned to room with totalA for energy conservation. Discussed d/c plan with pt likely at w/c level and recommendation for ramp to enter/exit home; wife reports she has been in contact with several people to determine cost/rental time frames/etc and plans to have ramp by d/c next week. Discussed with wife that pt is ready to begin stair training with therapy but will likely not be able to use stairs functionally by d/c date; wife understanding. Remained  seated in w/c with all needs in reach and quick release belt intact at completion of session.   Therapy Documentation Precautions:  Precautions Precautions: Fall Precaution Comments: L hemiparesis/flaccid, right head turn Restrictions Weight Bearing Restrictions: No Pain: Pain Assessment Pain Assessment: No/denies pain Pain Score: 7  Pain Type: Acute pain Pain Location: Back Pain Orientation: Lower Pain Descriptors / Indicators: Aching Pain Onset: On-going Patients Stated Pain Goal: 3 Pain Intervention(s): Other (Comment) (declined meds at this time) Multiple Pain Sites: No   See Function Navigator for Current Functional Status.   Therapy/Group: Individual Therapy  [name] 01/27/2016, 2:18 PM

## 2016-01-27 NOTE — Patient Care Conference (Signed)
Inpatient RehabilitationTeam Conference and Plan of Care Update Date: 01/27/2016   Time: 10:50 AM    Patient Name: Jon [name]      Medical Record Number: [id number]  Date of Birth: 12-20-1955 Sex: Male         Room/Bed: 4W06C/4W06C-01 Payor Info: Payor: [employer] / [place] / Product Type: *No Product type* /    Admitting Diagnosis: CVA  Admit Date/Time:  01/14/2016  3:41 PM Admission Comments: No comment available   Primary Diagnosis:  Hemorrhagic stroke (HCC) Principal Problem: Hemorrhagic stroke [facility](HCC)  Patient Active Problem List   Diagnosis Date Noted  . Muscle spasticity   . Poor appetite   . Hyperlipidemia 01/14/2016  . Depression 01/14/2016  . Obesity 01/14/2016  . Hyperglycemia 01/14/2016  . Urinary tract infection, site not specified 01/14/2016  . Basal ganglia hemorrhage (HCC) 01/14/2016  . Hemiparesis affecting nondominant side as late effect of cerebrovascular accident (HCC)   . Cognitive deficit, post-stroke   . Left hemiparesis (HCC)   . Essential hypertension   . Dysphagia, post-stroke   . Gait disturbance, post-stroke   . Hyponatremia   . Thrombocytopenia (HCC)   . Hemorrhagic stroke (HCC) R basal ganglia d/t HTN 01/08/2016    Expected Discharge Date: Expected Discharge Date: 02/04/16  Team Members Present: Physician leading conference: Dr. Maryla MorrowAnkit Patel Social Worker Present: Dossie DerBecky Terianna Peggs, LCSW Nurse Present: Chana Bodeeborah Sharp, RN PT Present: Alyson ReedyElizabeth Tygielski, PT OT Present: Rosalio LoudSarah Hoxie, OT SLP Present: Fae PippinMelissa Bowie, SLP PPS Coordinator present : Tora DuckMarie Noel, RN, CRRN     Current Status/Progress Goal Weekly Team Focus  Medical   Left hemiplegiasecondary to right basal ganglia hemorrhage secondary to hypertensive crisis  Improve spasticity, oral intake  See above   Bowel/Bladder   continent of bowel & bladder during the day, prefers condom cath at night, does not want to get up  continent of B & B with mod assist  leave condom cath off during  the day   Swallow/Nutrition/ Hydration   D  supervision   use of safe swallowing strategies during meals    ADL's   mod assist transfers, max-total assist LB dressing, severe Lt neglect, LUE flaccid with absent sensation  min A balance, bathroom transfers,UB dressing; mod A LB dressing, toileting  ADL retraining, pt/family education, LUE NMR, trunk control, balance, visual scanning   Mobility   minA bed mobility, modA squat pivot transfers, minA sit <>stand, maxA gait with R rail, minA w/c propulsion. Continues to be limited by attention/initiation  S bed mobility, minA transfers, modA gait controlled environment, S w/c propulsion  transfers, gait training, sit <>stand and standing balance, w/c propulsion, family education   Communication   Mild dysarthria, fluctuates depending on fatigue   supervision   continue to address intelligibility in conversations pending improved mentation    Safety/Cognition/ Behavioral Observations  Moderate cognitive impairment, improved sustained attention to task, overall mod assist for basic, structured tasks   min assist   continue to address awareness of deficits, visual scanning to the left of midline, attention to tasks.    Pain   has prn tylenol #3, averages between 1- 2 doses of 2 tabs daily, sometimes skips a day  pain scale <3  continue to assess & treat as needed   Skin   EPBC to scarum; pressure relief  To keep skin free of pressure ulcers.  continue to assess q shift      *See Care Plan and progress notes for long and short-term  goals.  Barriers to Discharge: Safety, transfers, oral intake    Possible Resolutions to Barriers:  Continue rehabilitation, optimize spasticity meds, improve oral intake    Discharge Planning/Teaching Needs:  Wife has been here daily to observe and help motivate pt to participate in therapies. He has shown some improvements this week.      Team Discussion:  Making progress slowly-condom cath off during the day  still using at night. Cont of bowel. Currently mod/max transfers, ambulating in hall with PT only-max assist. Limited by inattention and left neglect. Pain in hip treating with tylenol. Impulsive which makes him a safety risk. Wife here daily and attending therapies with pt. Increasing medications for spascity. Give more time around meals to eat-slow eater.  Revisions to Treatment Plan:  None   Continued Need for Acute Rehabilitation Level of Care: The patient requires daily medical management by a physician with specialized training in physical medicine and rehabilitation for the following conditions: Daily direction of a multidisciplinary physical rehabilitation program to ensure safe treatment while eliciting the highest outcome that is of practical value to the patient.: Yes Daily medical management of patient stability for increased activity during participation in an intensive rehabilitation regime.: Yes Daily analysis of laboratory values and/or radiology reports with any subsequent need for medication adjustment of medical intervention for : Neurological problems;Nutritional problems  [name], Lemar LivingsRebecca G 01/27/2016, 1:02 PM

## 2016-01-27 NOTE — Progress Notes (Signed)
Speech Language Pathology Daily Session Note  Patient Details  Name: Jon [name] MRN: [id number] Date of Birth: 1955-10-18  Today's Date: 01/27/2016 SLP Individual Time: 1000-1030 SLP Individual Time Calculation (min): 30 min  Short Term Goals: Week 2: SLP Short Term Goal 1 (Week 2): Pt will sustain his attention to basic tasks for 3-5 minute intervals with mod assist verbal cues for redirection.  SLP Short Term Goal 2 (Week 2): Pt will verbalize at least 2 deficits s/p CVA with min assist question cues.   SLP Short Term Goal 3 (Week 2): Pt will be intelligible at the conversational level with min assist verbal cues for overarticulation, increased vocal intensity and slow rate of speech.   SLP Short Term Goal 4 (Week 2): Pt will consume dys 3 textures and thin liquids with min assist verbal cues for use of swallowing precautions.   SLP Short Term Goal 5 (Week 2): Pt will initiate visual scanning to the left of midline during basic, famliar tasks with min assist verbal cues in 50% of opportunities.    Skilled Therapeutic Interventions: Skilled treatment session focused on addressing cognition goals. Upon SLP arrival patient reported needing to utilize the bathroom.  SLP and nurse tech utilized the Steady with Max assist multimodal cues for left attention which resulted in throughout self-care task.  SLP also facilitated session by providing Max assist multimodal cues to self-monitor and correct errors during a basic money sorting and change making task.  Patient sustained attention to task for 1 minute increments with Mod verbal cues.  Continue with current plan of care.   Function:  Cognition Comprehension Comprehension assist level: Understands basic 90% of the time/cues < 10% of the time  Expression   Expression assist level: Expresses basic 90% of the time/requires cueing < 10% of the time.  Social Interaction Social Interaction assist level: Interacts appropriately 75 - 89% of the time  - Needs redirection for appropriate language or to initiate interaction.  Problem Solving Problem solving assist level: Solves basic 25 - 49% of the time - needs direction more than half the time to initiate, plan or complete simple activities  Memory Memory assist level: Recognizes or recalls 75 - 89% of the time/requires cueing 10 - 24% of the time    Pain Pain Assessment Pain Assessment: 0-10 Pain Score: 7  Pain Type: Acute pain Pain Location: Back Pain Orientation: Lower Pain Descriptors / Indicators: Aching Pain Onset: On-going Patients Stated Pain Goal: 3 Pain Intervention(s): Other (Comment) (declined meds at this time) Multiple Pain Sites: No  Therapy/Group: Individual Therapy  Charlane FerrettiMelissa Twain Stenseth, M.A., CCC-SLP [phone]  Sena Clouatre 01/27/2016, 10:59 AM

## 2016-01-27 NOTE — Progress Notes (Signed)
Subjective/Complaints: Pt getting up with nursing this AM to urinate.  He and his wife have questions regarding spasticity and tone.   Review of systems: denies chest pain shortness of breath nausea vomiting diarrhea and constipation  Objective: Vital Signs: Blood pressure 130/60, pulse 60, temperature 98.6 F (37 C), temperature source Oral, resp. rate 18, weight 90.7 kg (199 lb 15.3 oz), SpO2 98 %. No results found. Results for orders placed or performed during the hospital encounter of 01/14/16 (from the past 72 hour(s))  Basic metabolic panel     Status: None   Collection Time: 01/27/16  4:56 AM  Result Value Ref Range   Sodium 140 135 - 145 mmol/L   Potassium 3.9 3.5 - 5.1 mmol/L   Chloride 106 101 - 111 mmol/L   CO2 23 22 - 32 mmol/L   Glucose, Bld 98 65 - 99 mg/dL   BUN 14 6 - 20 mg/dL   Creatinine, Ser 0.77 0.61 - 1.24 mg/dL   Calcium 10.0 8.9 - 10.3 mg/dL   GFR calc non Af Amer >60 >60 mL/min   GFR calc Af Amer >60 >60 mL/min    Comment: (NOTE) The eGFR has been calculated using the CKD EPI equation. This calculation has not been validated in all clinical situations. eGFR's persistently <60 mL/min signify possible Chronic Kidney Disease.    Anion gap 11 5 - 15  CBC with Differential/Platelet     Status: Abnormal   Collection Time: 01/27/16  4:56 AM  Result Value Ref Range   WBC 8.7 4.0 - 10.5 K/uL   RBC 4.40 4.22 - 5.81 MIL/uL   Hemoglobin 15.0 13.0 - 17.0 g/dL   HCT 45.4 39.0 - 52.0 %   MCV 103.2 (H) 78.0 - 100.0 fL   MCH 34.1 (H) 26.0 - 34.0 pg   MCHC 33.0 30.0 - 36.0 g/dL   RDW 11.9 11.5 - 15.5 %   Platelets 270 150 - 400 K/uL   Neutrophils Relative % 66 %   Neutro Abs 5.8 1.7 - 7.7 K/uL   Lymphocytes Relative 24 %   Lymphs Abs 2.1 0.7 - 4.0 K/uL   Monocytes Relative 7 %   Monocytes Absolute 0.6 0.1 - 1.0 K/uL   Eosinophils Relative 2 %   Eosinophils Absolute 0.2 0.0 - 0.7 K/uL   Basophils Relative 1 %   Basophils Absolute 0.1 0.0 - 0.1 K/uL     HEENT: Normocephalic, atraumatic.  Cardio: RRR Resp: CTA B/L and unlabored GI: BS positive and mild epigastric distention and tenderness Skin:   Warm and dry.  Neuro:Cranial Nerve Abnormalities left central 7 Right facial weakness  Abnormal Sensory Decreased sensation left upper and left lower limb Abnormal Motor 0/5 left upper extremity 0/5 left lower extremity 5/5 right upper extremity 5/5  right lower extremity Tone:  left elbow flexor 3/4, left finger flexors 1/4 as well left knee flexors Ashworth grade 3/4  Musc/Skel: No edema. No tenderness.  Gen. no acute distress. Vital signs reviewed.  Psych: Mood and affect normal, distracted  Assessment/Plan: 1. Functional deficits secondary to right hemiparesis causing left hemiplegia which require 3+ hours per day of interdisciplinary therapy in a comprehensive inpatient rehab setting. Physiatrist is providing close team supervision and 24 hour management of active medical problems listed below. Physiatrist and rehab team continue to assess barriers to discharge/monitor patient progress toward functional and medical goals. FIM: Function - Bathing Position: Shower Body parts bathed by patient: Left arm, Chest, Abdomen, Front perineal area, Right  upper leg, Left upper leg Body parts bathed by helper: Back, Buttocks, Right arm Bathing not applicable: Left lower leg, Right lower leg Assist Level: Touching or steadying assistance(Pt > 75%)  Function- Upper Body Dressing/Undressing Upper body dressing/undressing activity did not occur: Refused What is the patient wearing?: Pull over shirt/dress Pull over shirt/dress - Perfomed by patient: Thread/unthread left sleeve, Put head through opening, Pull shirt over trunk Pull over shirt/dress - Perfomed by helper: Thread/unthread right sleeve Assist Level: Touching or steadying assistance(Pt > 75%) Function - Lower Body Dressing/Undressing What is the patient wearing?: Pants, Socks, Shoes,  AFO Position: Wheelchair/chair at sink Pants- Performed by patient: Pull pants up/down Pants- Performed by helper: Thread/unthread right pants leg, Thread/unthread left pants leg Non-skid slipper socks- Performed by helper: Don/doff right sock, Don/doff left sock Socks - Performed by helper: Don/doff right sock, Don/doff left sock Shoes - Performed by patient: Don/doff right shoe Shoes - Performed by helper: Don/doff left shoe AFO - Performed by helper: Don/doff left AFO Assist for footwear: Maximal assist Assist for lower body dressing:  (Max assist)  Function - Toileting Toileting activity did not occur: No continent bowel/bladder event Toileting steps completed by patient: Adjust clothing prior to toileting Toileting steps completed by helper: Adjust clothing prior to toileting, Adjust clothing after toileting Toileting Assistive Devices: Grab bar or rail Assist level:  (total assist)  Function - Air cabin crew transfer activity did not occur: Safety/medical concerns Toilet transfer assistive device: Radio broadcast assistant lift: Stedy Assist level to toilet: Total assist (Pt < 25%) Assist level from toilet: Maximal assist (Pt 25 - 49%/lift and lower)  Function - Chair/bed transfer Chair/bed transfer method: Stand pivot, Squat pivot Chair/bed transfer assist level: Maximal assist (Pt 25 - 49%/lift and lower) Chair/bed transfer assistive device: Armrests Chair/bed transfer details: Verbal cues for precautions/safety, Tactile cues for placement, Tactile cues for sequencing, Tactile cues for weight shifting, Verbal cues for technique  Function - Locomotion: Wheelchair Will patient use wheelchair at discharge?:  (TBD) Type: Manual Max wheelchair distance: 50 Assist Level: Supervision or verbal cues Assist Level: Supervision or verbal cues Wheel 150 feet activity did not occur: Safety/medical concerns Turns around,maneuvers to table,bed, and toilet,negotiates 3%  grade,maneuvers on rugs and over doorsills: No Function - Locomotion: Ambulation Ambulation activity did not occur: Safety/medical concerns Assistive device: Rail in hallway Max distance: 30 Assist level: Maximal assist (Pt 25 - 49%) Assist level: Maximal assist (Pt 25 - 49%) Walk 50 feet with 2 turns activity did not occur: Safety/medical concerns Walk 150 feet activity did not occur: Safety/medical concerns Walk 10 feet on uneven surfaces activity did not occur: Safety/medical concerns  Function - Comprehension Comprehension: Auditory Comprehension assist level: Understands basic 90% of the time/cues < 10% of the time  Function - Expression Expression: Verbal Expression assist level: Expresses basic 90% of the time/requires cueing < 10% of the time.  Function - Social Interaction Social Interaction assist level: Interacts appropriately 75 - 89% of the time - Needs redirection for appropriate language or to initiate interaction.  Function - Problem Solving Problem solving assist level: Solves basic 50 - 74% of the time/requires cueing 25 - 49% of the time  Function - Memory Memory assist level: Recognizes or recalls 50 - 74% of the time/requires cueing 25 - 49% of the time Patient normally able to recall (first 3 days only): Current season  Medical Problem List and Plan: 1.Left hemiplegia secondary to right basal ganglia hemorrhage secondary to hypertensive crisis  Continue CIR   PRAFO, splints  2.  DVT Prophylaxis/Anticoagulation: Subcutaneous heparin initiated 01/10/2016. Monitor for any bleeding episodes 3. Pain Management/headaches: Tylenol 3 as needed, Post stroke headaches started Topamax 25 twice a day, topical analgesic cream 4. Dysphagia. Dysphagia 3 thin liquid diet being tolerated. 5. Neuropsych: This patient is not fully capable of making decisions on his own behalf. 6. Skin/Wound Care: Routine skin checks, Completed Keflex for left antecubital fossa cellulitis,  superficial 7. Fluids/Electrolytes/Nutrition: Routine I&O's   changed to lactose-free diet- has helped with abdominal discomfort  poor intake, started Megace, this may also be cognitive based--eating 30-50% of meals 8. Hypertension. Norvasc 10 mg daily, Lopressor 75 mg twice a day. Monitor with increased mobility 9. History of alcohol use.  10. Hyperlipidemia. Pravachol 11. Mood. Zoloft 50 mg daily. Provide emotional support 12. Urinary tract infection completed 7 days of Keflex  13. Cognitive deficits post stroke continue speech therapy (appear to be improving) 14. Spasticity  Cont tizanidine TID  Baclofen 72m TID started on 7/4, increased to 20U on 7/5    LOS (Days) 13 A FACE TO FACE EVALUATION WAS PERFORMED  Ankit ALorie Phenix7/11/2015, 9:53 AM

## 2016-01-27 NOTE — Progress Notes (Signed)
Speech Language Pathology Daily Session Note  Patient Details  Name: Jon [name] MRN: [id number] Date of Birth: 1956-04-13  Today's Date: 01/27/2016 SLP Individual Time: 1500-1537 SLP Individual Time Calculation (min): 37 min  Short Term Goals: Week 2: SLP Short Term Goal 1 (Week 2): Pt will sustain his attention to basic tasks for 3-5 minute intervals with mod assist verbal cues for redirection.  SLP Short Term Goal 2 (Week 2): Pt will verbalize at least 2 deficits s/p CVA with min assist question cues.   SLP Short Term Goal 3 (Week 2): Pt will be intelligible at the conversational level with min assist verbal cues for overarticulation, increased vocal intensity and slow rate of speech.   SLP Short Term Goal 4 (Week 2): Pt will consume dys 3 textures and thin liquids with min assist verbal cues for use of swallowing precautions.   SLP Short Term Goal 5 (Week 2): Pt will initiate visual scanning to the left of midline during basic, famliar tasks with min assist verbal cues in 50% of opportunities.    Skilled Therapeutic Interventions: Skilled treatment session focused on cognitive goals. SLP facilitated session by initially providing Mod A verbal and question cues which faded to supervision by the end of a basic calendar making task to self-monitor and correct errors. Patient attended to the left field of environment throughout the task with supervision verbal cues and demonstrated sustained attention to task for ~ 2 minutes with Mod I prior to requiring redirection. However, patient completed task within 10 minutes! Patient [facility] the bed [facility]. Continue with current plan of care.    Function:  Cognition Comprehension Comprehension assist level: Understands basic 90% of the time/cues < 10% of the time  Expression   Expression assist level: Expresses basic 90% of the time/requires cueing < 10% of the time.  Social Interaction Social Interaction assist level:  Interacts appropriately 75 - 89% of the time - Needs redirection for appropriate language or to initiate interaction.  Problem Solving Problem solving assist level: Solves basic 25 - 49% of the time - needs direction more than half the time to initiate, plan or complete simple activities  Memory Memory assist level: Recognizes or recalls 75 - 89% of the time/requires cueing 10 - 24% of the time    Pain Pain Assessment Pain Assessment: No/denies pain  Therapy/Group: Individual Therapy  Jon [name] 01/27/2016, 4:13 PM

## 2016-01-27 NOTE — Progress Notes (Signed)
Occupational Therapy Session Note  Patient Details  Name: Jon [name] D Tierno MRN: [id number] Date of Birth: November 06, 1955  Today's Date: 01/27/2016 OT Individual Time: [id number] and 1435-1500 OT Individual Time Calculation (min): 58 min and 25 min   Short Term Goals: Week 2:  OT Short Term Goal 1 (Week 2): Pt will complete transfer into tub/shower with tub bench and mod assist squat pivot of one caregiver. OT Short Term Goal 2 (Week 2): Pt will complete bathing with mod assist of one caregiver sit to stand. OT Short Term Goal 3 (Week 2): Pt will complete LB dressing with max assist of one caregiver sit to stand. OT Short Term Goal 4 (Week 2): Pt will complete toilet transfer with mod assist stand pivot to the 3:1. OT Short Term Goal 5 (Week 2): Pt/family will return demonstrate safe performance of PROM exercises for the LUE following handout.    Skilled Therapeutic Interventions/Progress Updates:    1) Treatment session with focus on functional transfers, sit > stand, and Lt attention during self-care tasks.  Pt upright in w/c upon arrival and willing to participate in bathing at shower level.  Completed squat pivot transfer into room shower with use of grab bars and mod assist.  Pt demonstrated increased attention to task during shower as pt with decreased external distractions, however still internally distracted requiring mod cues for sequencing.  Sit > stand in shower with mod assist for therapist to wash buttocks.  Engaged in dressing at sit > stand level at sink with focus on orientation of shirt and sequencing of dressing, therapist providing max cues for orientation and sequencing.  Pt demonstrating intermittent improvements with Lt attention, with improved ability to scan to Lt to obtain objects during self-care tasks and scan to locate therapist on Lt.  2) Treatment session with focus on functional transfers and attention to task.  Upon arrival pt reporting need to toilet.  Performed squat pivot  transfer w/c > toilet with mod assist.  Pt able to complete clothing management pre/post toileting with mod assist to maintain standing balance and blocking at Lt knee.  Pt required increased time with hand hygiene due to decreased sustained attention, however noted that pt independently brought LUE into sink to engage in hand hygiene.  Engaged in card matching activity with focus on sustained attention and sequencing of matching activity, progressing to memory task with matching.  Pt demonstrating increased impulsivity with disregard to simple instructions provided by therapist.  Continue to educate on safety and sequencing during ADLs and transfers to increase pt and pt's family safety.  Therapy Documentation Precautions:  Precautions Precautions: Fall Precaution Comments: L hemiparesis/flaccid, right head turn Restrictions Weight Bearing Restrictions: No General:   Vital Signs: Therapy Vitals Pulse Rate: 60 BP: 130/60 mmHg Pain: Pain Assessment Pain Assessment: 0-10 Pain Score: 7  Pain Type: Acute pain Pain Location: Back Pain Orientation: Lower Pain Descriptors / Indicators: Aching Pain Onset: On-going Patients Stated Pain Goal: 3 Pain Intervention(s): Other (Comment) (declined meds at this time) Multiple Pain Sites: No  See Function Navigator for Current Functional Status.   Therapy/Group: Individual Therapy  [name], Morrissa Shein 01/27/2016, 12:07 PM

## 2016-01-28 ENCOUNTER — Inpatient Hospital Stay (HOSPITAL_COMMUNITY): Payer: PRIVATE HEALTH INSURANCE | Admitting: Physical Therapy

## 2016-01-28 ENCOUNTER — Inpatient Hospital Stay (HOSPITAL_COMMUNITY): Payer: PRIVATE HEALTH INSURANCE | Admitting: Occupational Therapy

## 2016-01-28 ENCOUNTER — Inpatient Hospital Stay (HOSPITAL_COMMUNITY): Payer: PRIVATE HEALTH INSURANCE | Admitting: Speech Pathology

## 2016-01-28 MED ORDER — MUSCLE RUB 10-15 % EX CREA: TOPICAL_CREAM | CUTANEOUS | Status: DC | PRN

## 2016-01-28 NOTE — Consult Note (Signed)
  INITIAL DIAGNOSTIC EVALUATION - [facility]   MEDICAL NECESSITY:  [name] for an initial diagnostic evaluation owing to the patient's diagnosis of CVA.   Records indicate that Jon [name] is a "60 y.o. right handed male with history of hypertension as well as alcohol use. Patient on no prescription medications. He is married and worked full-time prior to admission. Wife can assist on discharge. Presented 01/08/2016 with acute onset of headache and left-sided weakness. Initial blood pressure was reportedly elevated. CT of the head showed right basal ganglia hemorrhage with 31 mL volume. 3 mm midline shift. CT angiogram head and neck with no intracranial or extracranial stenosis dissection or aneurysm. Echocardiogram with ejection fraction of 65% no wall motion abnormalities. Patient did not receive TPA secondary to ICH. Subcutaneous heparin for DVT prophylaxis added 01/10/2016."    During today's visit, Jon [name] was accompanied by his wife, Rosey Batheresa, who assisted with the history. Cognitively, they have observed post-stroke poor concentration, slowed processing speed, mild memory issues, and slurred speech. He also experiences left-sided upper and lower extremity motor deficits, as well as certain visual deficits which sound like left neglect to some degree.   From an emotional standpoint, Jon [name] has a history of untreated depression and anxiety that he self-medicated with alcohol. He was also experiencing a significant degree of stress from his work. Patient was prescribed Zoloft during this hospitalization and his wife feels that it has been very helpful. His current depressive symptoms are mostly physical in nature and include fatigue, low motivation, low energy, and hypersomnia. As mentioned, he was a functioning alcoholic who drank ~30 alcoholic beverages per day for several years. This is likely what  led to liver and intestinal issues. He has never been treated for alcohol dependence and he denied ever suffering any alcohol withdrawal. Patient said that he would consider mental health counseling for depression and alcohol issues. No adjustment issues endorsed. Suicidal/homicidal ideation, plan or intent was denied. No manic or hypomanic episodes were reported. The patient denied ever experiencing any auditory/visual hallucinations. No major behavioral or personality changes were endorsed.   Jon [name] feels that he has made progress in therapy. He described the rehab staff as "good." No major barriers to therapy noted. He has his wife to support him throughout this endeavor. He plans to apply for disability benefits.   PROCEDURES: [1 unit 90791] Diagnostic clinical interview  Review of available records   IMPRESSION: Overall, Jon [name] endorsed suffering from post-stroke cognitive issues. As such, undergoing a neuropsychological evaluation upon discharge is warranted. Emotionally, there is a history of prolonged depression and anxiety that was being medicated via significant alcohol use. He is now on Zoloft which is beneficial. I recommend treatment for alcoholism to include completely abstaining from alcohol, participation in [place] with a sponsor, and support via individual counseling and his family. If he cannot abstain with that level of support an intervention may be necessary. This was discussed with his wife. I also recommend that all of the rehab staff and his care providers encourage complete abstinence from alcohol. His wife is very concerned about relapse and requests resources for family members. I ask that her social worker assist with this endeavor. I will informally follow-up with the patient next week.    DIAGNOSES:  CVA Major Depressive Disorder Alcohol Dependence Unspecified Anxiety Disorder   Debbe MountsAdam T. Reubin Bushnell, Psy.D., ABN Board-Certified Clinical Neuropsychologist

## 2016-01-28 NOTE — Progress Notes (Signed)
Physical Therapy Weekly Progress Note  Patient Details  Name: Jon [name] MRN: [id number] Date of Birth: 04-Mar-1956  Beginning of progress report period: January 22, 2016 End of progress report period: January 28, 2016  Today's Date: 01/28/2016 PT Individual Time: 0800-0900 PT Individual Time Calculation (min): 60 min   Patient has met 2 of 5 short term goals.  Requires minA for bed mobility due to flaccid LUE and poor motor control/apraxia of LLE. Gait requires modA short distances with HW; continues to require assist to progress LLE however demonstrating consistent stance control without buckling. Transfers very from mod to maxA depending on direction (transfer toward L hemi side requires more assist), and pt initiation, attention. Have begun hands-on training with wife, and on-going education regarding L inattention, attention impairments, impulsivity. Discussed home entry with wife who reports she has initiated the process of getting a ramp to allow for pt to get in/out of house using w/c.   Patient continues to demonstrate the following deficits: activity tolerance, balance, postural control, ability to compensate for deficits, functional use of  left upper extremity and left lower extremity, attention, awareness, coordination and knowledge of precautions and therefore will continue to benefit from skilled PT intervention to enhance overall performance with bed mobility, transfer, gait, stairs, w/c propulsion, home and community access.   Patient progressing toward long term goals..  Continue plan of care.  PT Short Term Goals Week 2:  PT Short Term Goal 1 (Week 2): Pt will perform transfers w/c <>bed with minA PT Short Term Goal 1 - Progress (Week 2): Progressing toward goal PT Short Term Goal 2 (Week 2): Pt will perform bed mobility with S PT Short Term Goal 2 - Progress (Week 2): Progressing toward goal PT Short Term Goal 3 (Week 2): Pt will ambulate 40' with modA and LRAD PT Short Term  Goal 3 - Progress (Week 2): Met PT Short Term Goal 4 (Week 2): Pt will propel w/c minA with R hemi technique PT Short Term Goal 4 - Progress (Week 2): Met PT Short Term Goal 5 (Week 2): Pt will initiate stair training PT Short Term Goal 5 - Progress (Week 2): Not met Week 3:  PT Short Term Goal 1 (Week 3): =LTG due to estimated LOS  Skilled Therapeutic Interventions/Progress Updates:    Pt received seated in bed eating breakfast; denies pain and agreeable to treatment. Supine>sit with bedrails and S, increased time. Dressing performed EOB with mod/maxA with cues for technique and sequencing. Transfer bed>w/c with wife providing maxA and pt initiating stand pivot as opposed to squat pivot as instructed. W/c propulsion 2x150' with R hemi technique. Transfer w/c <>bed in ADL apartment with wife providing modA; demonstrated to pt low hip clearance for squat pivot vs stand pivot with decreased energy expenditure and increased safety for wife. Performed x2. Sit <>supine in bed with minA for LLE onto bed. Gait 1x30' with HW and modA; attempted to turn however pt fatigued and requested to sit. Improving management with HW. Returned to room totalA for energy conservation; remained seated in w/c with wife present and quick release belt intact, all needs in reach.   Therapy Documentation Precautions:  Precautions Precautions: Fall Precaution Comments: L hemiparesis/flaccid, right head turn Restrictions Weight Bearing Restrictions: No Pain: Pain Assessment Pain Assessment: No/denies pain Pain Score: 0-No pain Pain Type: Acute pain Pain Intervention(s): Medication (See eMAR) (returned from therapy-premedicated for the day )   See Function Navigator for Current Functional Status.  Therapy/Group: Individual Therapy  Benjiman Core Tygielski 01/28/2016, 10:51 AM

## 2016-01-28 NOTE — Progress Notes (Signed)
Speech Language Pathology Daily Session Note  Patient Details  Name: Jon [name] D Odland MRN: [id number] Date of Birth: May 31, 1956  Today's Date: 01/28/2016 SLP Individual Time: [id number] SLP Individual Time Calculation (min): 42 min  Short Term Goals: Week 2: SLP Short Term Goal 1 (Week 2): Pt will sustain his attention to basic tasks for 3-5 minute intervals with mod assist verbal cues for redirection.  SLP Short Term Goal 2 (Week 2): Pt will verbalize at least 2 deficits s/p CVA with min assist question cues.   SLP Short Term Goal 3 (Week 2): Pt will be intelligible at the conversational level with min assist verbal cues for overarticulation, increased vocal intensity and slow rate of speech.   SLP Short Term Goal 4 (Week 2): Pt will consume dys 3 textures and thin liquids with min assist verbal cues for use of swallowing precautions.   SLP Short Term Goal 5 (Week 2): Pt will initiate visual scanning to the left of midline during basic, famliar tasks with min assist verbal cues in 50% of opportunities.    Skilled Therapeutic Interventions:  Pt was seen for skilled ST targeting goals for cognition and dysphagia.  Pt was eating lunch upon arrival.  Pt repositioned to edge of bed to encourage better posture for swallowing safety as he was noted to lean to the left while sitting in bed with poor awareness and no attempts to correct.  Pt consumed dys 3 solids and thin liquids with self initiated use of lingual sweep and liquid wash to clear residual solids from the oral cavity post swallow.  No overt s/s of aspiration evident with solids or liquids.  Pt's primary limitation to safe PO consumption was his decreased sustained attention to meal, for which he required min assist verbal cues to redirect to meal after 3-5 minute intervals.  After completion of meal, therapist facilitated the session with a basic card game targeting visual scanning to the left of midline.  Pt required mod assist verbal cues to  attend to cards on his left to find matches between pairs of cards during the abovementioned task.  Pt was handed off to primary OT at the end of today's therapy session.  Continue per current plan of care.    Function:  Eating Eating   Modified Consistency Diet: Yes Eating Assist Level: Set up assist for;Supervision or verbal cues;More than reasonable amount of time   Eating Set Up Assist For: Opening containers       Cognition Comprehension Comprehension assist level: Understands basic 90% of the time/cues < 10% of the time  Expression   Expression assist level: Expresses basic 90% of the time/requires cueing < 10% of the time.  Social Interaction Social Interaction assist level: Interacts appropriately 50 - 74% of the time - May be physically or verbally inappropriate.  Problem Solving Problem solving assist level: Solves basic 50 - 74% of the time/requires cueing 25 - 49% of the time  Memory Memory assist level: Recognizes or recalls 75 - 89% of the time/requires cueing 10 - 24% of the time    Pain Pain Assessment Pain Assessment: No/denies pain  Therapy/Group: Individual Therapy  Shawon Denzer, Joni ReiningNicole L 01/28/2016, 2:00 PM

## 2016-01-28 NOTE — Progress Notes (Signed)
Social Work Patient ID: Jon [name], male   DOB: Apr 25, 1956, 60 y.o.   MRN: [id number] Met with pt and wife to discuss team conference progress this week and wife beginning to do hands on care. She wants to be prepared for discharge and comfortable with the care She needs to provide to pt. She is working on obtaining a ramp into the home. Pt benefited from seeing neuro-psych and Adam to follow up next Monday when here. Work on discharge plans.

## 2016-01-28 NOTE — Progress Notes (Signed)
Occupational Therapy Session Note  Patient Details  Name: Jon [name] D Kregel MRN: [id number] Date of Birth: [birth year]  Today's Date: 01/28/2016 OT Individual Time: 1430-1500 OT Individual Time Calculation (min): 30 min    Short Term Goals: Week 2:  OT Short Term Goal 1 (Week 2): Pt will complete transfer into tub/shower with tub bench and mod assist squat pivot of one caregiver. OT Short Term Goal 2 (Week 2): Pt will complete bathing with mod assist of one caregiver sit to stand. OT Short Term Goal 3 (Week 2): Pt will complete LB dressing with max assist of one caregiver sit to stand. OT Short Term Goal 4 (Week 2): Pt will complete toilet transfer with mod assist stand pivot to the 3:1. OT Short Term Goal 5 (Week 2): Pt/family will return demonstrate safe performance of PROM exercises for the LUE following handout.    Skilled Therapeutic Interventions/Progress Updates:    Pt seen for OT session focusing on neuro re-ed with attention to L side and L attention. Pt received in w/c with hand off from other therapist. He was taken to therapy day room to utilize high/low table.   Pt completed scanning task, required to look at two different cards to find same item on each card. Required mod cuing to attend to task, distracted by internal and external stimuli. Completed with R UE supported on table for proprioceptive input. Attempted to complete task in standing, however, due to L lean, inattention, and lack of initiation, pt unable to complete standing. Was mod A to stand, and max- total A to maintain static standing balance.  Pt self propelled w/c ~6550ft back to room, requiring max cuing for attention to L during w/c propulsion using hemi technique. Pt left sitting in w/c at end of session, wife present and all needs in reach.   Therapy Documentation Precautions:  Precautions Precautions: Fall Precaution Comments: L hemiparesis/flaccid, right head turn Restrictions Weight Bearing Restrictions:  No Pain:   No/ denies pain  See Function Navigator for Current Functional Status.   Therapy/Group: Individual Therapy  Lewis, Kodie Pick C 01/28/2016, 6:55 AM

## 2016-01-28 NOTE — Progress Notes (Addendum)
Subjective/Complaints: Pt laying in bed this AM.  He states he feels less tight in his muscles.  His wife has some questions regarding pt's response to alcohol questioning.   Review of systems: denies chest pain shortness of breath nausea vomiting diarrhea and constipation  Objective: Vital Signs: Blood pressure 149/74, pulse 58, temperature 98.1 F (36.7 C), temperature source Oral, resp. rate 18, weight 90.7 kg (199 lb 15.3 oz), SpO2 99 %. No results found. Results for orders placed or performed during the hospital encounter of 01/14/16 (from the past 72 hour(s))  Basic metabolic panel     Status: None   Collection Time: 01/27/16  4:56 AM  Result Value Ref Range   Sodium 140 135 - 145 mmol/L   Potassium 3.9 3.5 - 5.1 mmol/L   Chloride 106 101 - 111 mmol/L   CO2 23 22 - 32 mmol/L   Glucose, Bld 98 65 - 99 mg/dL   BUN 14 6 - 20 mg/dL   Creatinine, Ser 0.77 0.61 - 1.24 mg/dL   Calcium 10.0 8.9 - 10.3 mg/dL   GFR calc non Af Amer >60 >60 mL/min   GFR calc Af Amer >60 >60 mL/min    Comment: (NOTE) The eGFR has been calculated using the CKD EPI equation. This calculation has not been validated in all clinical situations. eGFR's persistently <60 mL/min signify possible Chronic Kidney Disease.    Anion gap 11 5 - 15  CBC with Differential/Platelet     Status: Abnormal   Collection Time: 01/27/16  4:56 AM  Result Value Ref Range   WBC 8.7 4.0 - 10.5 K/uL   RBC 4.40 4.22 - 5.81 MIL/uL   Hemoglobin 15.0 13.0 - 17.0 g/dL   HCT 45.4 39.0 - 52.0 %   MCV 103.2 (H) 78.0 - 100.0 fL   MCH 34.1 (H) 26.0 - 34.0 pg   MCHC 33.0 30.0 - 36.0 g/dL   RDW 11.9 11.5 - 15.5 %   Platelets 270 150 - 400 K/uL   Neutrophils Relative % 66 %   Neutro Abs 5.8 1.7 - 7.7 K/uL   Lymphocytes Relative 24 %   Lymphs Abs 2.1 0.7 - 4.0 K/uL   Monocytes Relative 7 %   Monocytes Absolute 0.6 0.1 - 1.0 K/uL   Eosinophils Relative 2 %   Eosinophils Absolute 0.2 0.0 - 0.7 K/uL   Basophils Relative 1 %   Basophils Absolute 0.1 0.0 - 0.1 K/uL    HEENT: Normocephalic, atraumatic.  Cardio: RRR Resp: CTA B/L and unlabored GI: BS positive and mild epigastric distention and tenderness Skin:   Warm and dry.  Neuro:Cranial Nerve Abnormalities left central 7 Right facial weakness  Abnormal Sensory Decreased sensation left upper and left lower limb Abnormal Motor 0/5 left upper extremity 0/5 left lower extremity 5/5 right upper extremity 5/5  right lower extremity Tone:  left elbow flexor 1+/4, left finger flexors 1/4 as well left knee flexors Ashworth grade 2/4  Musc/Skel: No edema. No tenderness.  Gen. no acute distress. Vital signs reviewed.  Psych: Mood and affect normal, distracted  Assessment/Plan: 1. Functional deficits secondary to right hemiparesis causing left hemiplegia which require 3+ hours per day of interdisciplinary therapy in a comprehensive inpatient rehab setting. Physiatrist is providing close team supervision and 24 hour management of active medical problems listed below. Physiatrist and rehab team continue to assess barriers to discharge/monitor patient progress toward functional and medical goals. FIM: Function - Bathing Position: Shower Body parts bathed by patient: Left  arm, Chest, Abdomen, Front perineal area, Right upper leg, Left upper leg Body parts bathed by helper: Back, Buttocks, Right arm Bathing not applicable: Left lower leg, Right lower leg Assist Level: Touching or steadying assistance(Pt > 75%)  Function- Upper Body Dressing/Undressing Upper body dressing/undressing activity did not occur: Refused What is the patient wearing?: Pull over shirt/dress Pull over shirt/dress - Perfomed by patient: Thread/unthread left sleeve, Put head through opening, Pull shirt over trunk Pull over shirt/dress - Perfomed by helper: Thread/unthread right sleeve Assist Level: Touching or steadying assistance(Pt > 75%) Function - Lower Body Dressing/Undressing What is the  patient wearing?: Pants, Socks, Shoes, AFO Position: Wheelchair/chair at sink Pants- Performed by patient: Pull pants up/down Pants- Performed by helper: Thread/unthread right pants leg, Thread/unthread left pants leg Non-skid slipper socks- Performed by helper: Don/doff right sock, Don/doff left sock Socks - Performed by helper: Don/doff right sock, Don/doff left sock Shoes - Performed by patient: Don/doff right shoe Shoes - Performed by helper: Don/doff left shoe AFO - Performed by helper: Don/doff left AFO Assist for footwear: Maximal assist Assist for lower body dressing:  (Max assist)  Function - Toileting Toileting activity did not occur: No continent bowel/bladder event Toileting steps completed by patient: Adjust clothing prior to toileting, Adjust clothing after toileting, Performs perineal hygiene Toileting steps completed by helper: Adjust clothing prior to toileting, Adjust clothing after toileting Toileting Assistive Devices: Grab bar or rail Assist level: Touching or steadying assistance (Pt.75%)  Function - Air cabin crew transfer activity did not occur: Safety/medical concerns Toilet transfer assistive device: Radio broadcast assistant lift: Stedy Assist level to toilet: Moderate assist (Pt 50 - 74%/lift or lower) Assist level from toilet: Moderate assist (Pt 50 - 74%/lift or lower)  Function - Chair/bed transfer Chair/bed transfer method: Stand pivot, Squat pivot Chair/bed transfer assist level: Maximal assist (Pt 25 - 49%/lift and lower) Chair/bed transfer assistive device: Armrests Chair/bed transfer details: Verbal cues for precautions/safety, Tactile cues for placement, Tactile cues for sequencing, Tactile cues for weight shifting, Verbal cues for technique  Function - Locomotion: Wheelchair Will patient use wheelchair at discharge?:  (TBD) Type: Manual Max wheelchair distance: 100 Assist Level: Touching or steadying assistance (Pt > 75%) Assist Level:  Touching or steadying assistance (Pt > 75%) Wheel 150 feet activity did not occur: Safety/medical concerns Turns around,maneuvers to table,bed, and toilet,negotiates 3% grade,maneuvers on rugs and over doorsills: No Function - Locomotion: Ambulation Ambulation activity did not occur: Safety/medical concerns Assistive device: Walker-hemi Max distance: 20 Assist level: Maximal assist (Pt 25 - 49%) Assist level: Maximal assist (Pt 25 - 49%) Walk 50 feet with 2 turns activity did not occur: Safety/medical concerns Walk 150 feet activity did not occur: Safety/medical concerns Walk 10 feet on uneven surfaces activity did not occur: Safety/medical concerns  Function - Comprehension Comprehension: Auditory Comprehension assist level: Understands basic 90% of the time/cues < 10% of the time  Function - Expression Expression: Verbal Expression assist level: Expresses basic 90% of the time/requires cueing < 10% of the time.  Function - Social Interaction Social Interaction assist level: Interacts appropriately 75 - 89% of the time - Needs redirection for appropriate language or to initiate interaction.  Function - Problem Solving Problem solving assist level: Solves basic 25 - 49% of the time - needs direction more than half the time to initiate, plan or complete simple activities  Function - Memory Memory assist level: Recognizes or recalls 75 - 89% of the time/requires cueing 10 - 24% of the time  Patient normally able to recall (first 3 days only): Current season, That he or she is in a hospital  Medical Problem List and Plan: 1.Left hemiplegia secondary to right basal ganglia hemorrhage secondary to hypertensive crisis    Continue CIR   PRAFO, splints  2.  DVT Prophylaxis/Anticoagulation: Subcutaneous heparin initiated 01/10/2016. Monitor for any bleeding episodes 3. Pain Management/headaches: Tylenol 3 as needed, Post stroke headaches started Topamax 25 twice a day, topical analgesic  cream 4. Dysphagia. Dysphagia 3 thin liquid diet being tolerated. 5. Neuropsych: This patient is not fully capable of making decisions on his own behalf. 6. Skin/Wound Care: Routine skin checks, Completed Keflex for left antecubital fossa cellulitis, superficial 7. Fluids/Electrolytes/Nutrition: Routine I&O's   changed to lactose-free diet- has helped with abdominal discomfort  poor intake, started Megace, this may also be cognitive based--eating 40% of meals yesterday 8. Hypertension. Norvasc 10 mg daily, Lopressor 75 mg twice a day.   Monitor with increased mobility  Elevated this AM, otherwise, well controlled 9. History of alcohol use.  10. Hyperlipidemia. Pravachol 11. Mood. Zoloft 50 mg daily. Provide emotional support 12. Urinary tract infection completed 7 days of Keflex  13. Cognitive deficits post stroke continue speech therapy (appear to be improving) 14. Spasticity  Cont tizanidine TID  Baclofen 80m TID started on 7/4, increased to 20U on 7/5  Appears to be improving    LOS (Days) 14 A FACE TO FACE EVALUATION WAS PERFORMED  Jon [name] ALorie Phenix7/12/2015, 8:24 AM

## 2016-01-28 NOTE — Progress Notes (Signed)
Occupational Therapy Session Note  Patient Details  Name: Jon CoolerGeorge D Henckel MRN: [id number] Date of Birth: 10/20/1955  Today's Date: 01/28/2016 OT Individual Time: [id number] and 1345-1430 OT Individual Time Calculation (min): 56 min and 45 min   Short Term Goals: Week 2:  OT Short Term Goal 1 (Week 2): Pt will complete transfer into tub/shower with tub bench and mod assist squat pivot of one caregiver. OT Short Term Goal 2 (Week 2): Pt will complete bathing with mod assist of one caregiver sit to stand. OT Short Term Goal 3 (Week 2): Pt will complete LB dressing with max assist of one caregiver sit to stand. OT Short Term Goal 4 (Week 2): Pt will complete toilet transfer with mod assist stand pivot to the 3:1. OT Short Term Goal 5 (Week 2): Pt/family will return demonstrate safe performance of PROM exercises for the LUE following handout.    Skilled Therapeutic Interventions/Progress Updates:    1) Treatment session with focus on functional transfers, attention to task, standing balance, and Lt attention.  Pt already dressed this session and declined bathing.  Engaged in dynamic sitting balance with focus on trunk control and Lt attention to complete vertical matching task increasing weight shift to Lt to locate matching items and place on target in Lt visual field.  Pt required max multimodal cues for sustained attention to task and attention to Lt.  Returned to bed via squat pivot and cues for pt to verbalize sequence and safety of transfer.  2) Treatment session with focus on family education for transfers.  Pt's wife asking to be trained to complete transfers, therefore engaged in hands on training regarding squat pivot transfers bed <> w/c to address sequencing of transfers prior to completing transfers to toilet.  Therapist performed initial transfer on/off bed while verbalizing sequence and technique to pt's wife.  Had pt and pt's wife return demonstration, wife requiring mod cues for hand  placement, weight shifting, and allowing pt to initiate movement to decrease burden of care.  Completed transfers bed <> w/c x3 with noted improvements, however pt still quite impulsive with pt's wife demonstrating difficulty managing his impulsivity.  Discussed bathroom layout and recommendation for grab bars in shower.  [facility]  Therapy Documentation Precautions:  Precautions Precautions: Fall Precaution Comments: L hemiparesis/flaccid, right head turn Restrictions Weight Bearing Restrictions: No General:   Vital Signs: Therapy Vitals Temp: 99 F (37.2 C) (RN Notified) Temp Source: Oral Pulse Rate: 62 Resp: 18 BP: 139/72 mmHg Patient Position (if appropriate): Lying Oxygen Therapy SpO2: 97 % O2 Device: Not Delivered Pain:  Pt with no c/o pain  See Function Navigator for Current Functional Status.   Therapy/Group: Individual Therapy  [name], Aariya Ferrick 01/28/2016, 3:23 PM

## 2016-01-29 ENCOUNTER — Inpatient Hospital Stay (HOSPITAL_COMMUNITY): Payer: PRIVATE HEALTH INSURANCE | Admitting: Occupational Therapy

## 2016-01-29 ENCOUNTER — Inpatient Hospital Stay (HOSPITAL_COMMUNITY): Payer: PRIVATE HEALTH INSURANCE | Admitting: Speech Pathology

## 2016-01-29 ENCOUNTER — Inpatient Hospital Stay (HOSPITAL_COMMUNITY): Payer: PRIVATE HEALTH INSURANCE | Admitting: Physical Therapy

## 2016-01-29 MED ORDER — TIZANIDINE HCL 4 MG PO TABS
4.0000 mg | ORAL_TABLET | Freq: Three times a day (TID) | ORAL | Status: DC
Start: 2016-01-29 — End: 2016-02-04
  Administered 2016-01-29 – 2016-02-04 (×18): 4 mg via ORAL
  Filled 2016-01-29 (×18): qty 1

## 2016-01-29 NOTE — Progress Notes (Signed)
Subjective/Complaints: Pt sitting up in his chair this AM, eating breakfast.  His wife has questions regarding discharge equipment, medications, ROM, and pt's inappropriate comments.   Review of systems: denies chest pain shortness of breath nausea vomiting diarrhea and constipation  Objective: Vital Signs: Blood pressure 142/69, pulse 54, temperature 98.9 F (37.2 C), temperature source Oral, resp. rate 18, weight 90.7 kg (199 lb 15.3 oz), SpO2 98 %. No results found. Results for orders placed or performed during the hospital encounter of 01/14/16 (from the past 72 hour(s))  Basic metabolic panel     Status: None   Collection Time: 01/27/16  4:56 AM  Result Value Ref Range   Sodium 140 135 - 145 mmol/L   Potassium 3.9 3.5 - 5.1 mmol/L   Chloride 106 101 - 111 mmol/L   CO2 23 22 - 32 mmol/L   Glucose, Bld 98 65 - 99 mg/dL   BUN 14 6 - 20 mg/dL   Creatinine, Ser 0.77 0.61 - 1.24 mg/dL   Calcium 10.0 8.9 - 10.3 mg/dL   GFR calc non Af Amer >60 >60 mL/min   GFR calc Af Amer >60 >60 mL/min    Comment: (NOTE) The eGFR has been calculated using the CKD EPI equation. This calculation has not been validated in all clinical situations. eGFR's persistently <60 mL/min signify possible Chronic Kidney Disease.    Anion gap 11 5 - 15  CBC with Differential/Platelet     Status: Abnormal   Collection Time: 01/27/16  4:56 AM  Result Value Ref Range   WBC 8.7 4.0 - 10.5 K/uL   RBC 4.40 4.22 - 5.81 MIL/uL   Hemoglobin 15.0 13.0 - 17.0 g/dL   HCT 45.4 39.0 - 52.0 %   MCV 103.2 (H) 78.0 - 100.0 fL   MCH 34.1 (H) 26.0 - 34.0 pg   MCHC 33.0 30.0 - 36.0 g/dL   RDW 11.9 11.5 - 15.5 %   Platelets 270 150 - 400 K/uL   Neutrophils Relative % 66 %   Neutro Abs 5.8 1.7 - 7.7 K/uL   Lymphocytes Relative 24 %   Lymphs Abs 2.1 0.7 - 4.0 K/uL   Monocytes Relative 7 %   Monocytes Absolute 0.6 0.1 - 1.0 K/uL   Eosinophils Relative 2 %   Eosinophils Absolute 0.2 0.0 - 0.7 K/uL   Basophils Relative  1 %   Basophils Absolute 0.1 0.0 - 0.1 K/uL    HEENT: Normocephalic, atraumatic.  Cardio: RRR Resp: CTA B/L and unlabored GI: BS positive and mild epigastric distention and tenderness Skin:   Warm and dry.  Neuro:Cranial Nerve Abnormalities left central 7 Right facial weakness  Abnormal Sensory Decreased sensation left upper and left lower limb Abnormal Motor 0/5 left upper extremity 0/5 left lower extremity 5/5 right upper extremity 5/5  right lower extremity Tone:  left elbow flexor 1+/4, left finger flexors 1/4 as well left knee flexors Ashworth grade 3/4  Musc/Skel: No edema. No tenderness.  Gen. no acute distress. Vital signs reviewed.  Psych: Mood and affect normal, distracted  Assessment/Plan: 1. Functional deficits secondary to right hemiparesis causing left hemiplegia which require 3+ hours per day of interdisciplinary therapy in a comprehensive inpatient rehab setting. Physiatrist is providing close team supervision and 24 hour management of active medical problems listed below. Physiatrist and rehab team continue to assess barriers to discharge/monitor patient progress toward functional and medical goals. FIM: Function - Bathing Position: Wheelchair/chair at sink Body parts bathed by patient: Left arm,  Chest, Abdomen (only completed UB bathing) Body parts bathed by helper: Right arm Bathing not applicable: Left lower leg, Right lower leg Assist Level: Touching or steadying assistance(Pt > 75%)  Function- Upper Body Dressing/Undressing Upper body dressing/undressing activity did not occur: Refused What is the patient wearing?: Pull over shirt/dress Pull over shirt/dress - Perfomed by patient: Thread/unthread right sleeve, Put head through opening, Pull shirt over trunk Pull over shirt/dress - Perfomed by helper: Thread/unthread left sleeve Assist Level: Touching or steadying assistance(Pt > 75%) Function - Lower Body Dressing/Undressing What is the patient wearing?:  Socks, Shoes, AFO Position: Wheelchair/chair at sink Pants- Performed by patient: Pull pants up/down Pants- Performed by helper: Thread/unthread right pants leg, Thread/unthread left pants leg Non-skid slipper socks- Performed by helper: Don/doff right sock, Don/doff left sock Socks - Performed by helper: Don/doff right sock, Don/doff left sock Shoes - Performed by patient: Don/doff right shoe Shoes - Performed by helper: Don/doff left shoe AFO - Performed by helper: Don/doff left AFO Assist for footwear: Maximal assist Assist for lower body dressing:  (max assist)  Function - Toileting Toileting activity did not occur: No continent bowel/bladder event Toileting steps completed by patient: Adjust clothing prior to toileting Toileting steps completed by helper: Performs perineal hygiene, Adjust clothing after toileting Toileting Assistive Devices: Grab bar or rail Assist level:  (Max assist)  Function - Air cabin crew transfer activity did not occur: Safety/medical concerns Toilet transfer assistive device: Radio broadcast assistant lift: Stedy Assist level to toilet: Moderate assist (Pt 50 - 74%/lift or lower) Assist level from toilet: Moderate assist (Pt 50 - 74%/lift or lower)  Function - Chair/bed transfer Chair/bed transfer method: Squat pivot Chair/bed transfer assist level: Moderate assist (Pt 50 - 74%/lift or lower) Chair/bed transfer assistive device: Armrests, Bedrails Chair/bed transfer details: Verbal cues for precautions/safety, Tactile cues for placement, Tactile cues for sequencing, Tactile cues for weight shifting, Verbal cues for technique, Manual facilitation for weight shifting, Manual facilitation for placement  Function - Locomotion: Wheelchair Will patient use wheelchair at discharge?:  (TBD) Type: Manual Max wheelchair distance: 100 Assist Level: Supervision or verbal cues Assist Level: Supervision or verbal cues Wheel 150 feet activity did not occur:  Safety/medical concerns Turns around,maneuvers to table,bed, and toilet,negotiates 3% grade,maneuvers on rugs and over doorsills: No Function - Locomotion: Ambulation Ambulation activity did not occur: Safety/medical concerns Assistive device: Walker-hemi Max distance: 30 Assist level: Moderate assist (Pt 50 - 74%) Assist level: Moderate assist (Pt 50 - 74%) Walk 50 feet with 2 turns activity did not occur: Safety/medical concerns Walk 150 feet activity did not occur: Safety/medical concerns Walk 10 feet on uneven surfaces activity did not occur: Safety/medical concerns  Function - Comprehension Comprehension: Auditory Comprehension assist level: Understands basic 90% of the time/cues < 10% of the time  Function - Expression Expression: Verbal Expression assist level: Expresses basic 90% of the time/requires cueing < 10% of the time.  Function - Social Interaction Social Interaction assist level: Interacts appropriately 50 - 74% of the time - May be physically or verbally inappropriate.  Function - Problem Solving Problem solving assist level: Solves basic 50 - 74% of the time/requires cueing 25 - 49% of the time  Function - Memory Memory assist level: Recognizes or recalls 75 - 89% of the time/requires cueing 10 - 24% of the time Patient normally able to recall (first 3 days only): Current season, That he or she is in a hospital  Medical Problem List and Plan: 1.Left hemiplegia secondary to  right basal ganglia hemorrhage secondary to hypertensive crisis    Continue CIR   PRAFO, splints  2.  DVT Prophylaxis/Anticoagulation: Subcutaneous heparin initiated 01/10/2016. Monitor for any bleeding episodes 3. Pain Management/headaches: Tylenol 3 as needed, Post stroke headaches started Topamax 25 twice a day, topical analgesic cream 4. Dysphagia. Dysphagia 3 thin liquid diet being tolerated. 5. Neuropsych: This patient is not fully capable of making decisions on his own behalf. 6.  Skin/Wound Care: Routine skin checks, Completed Keflex for left antecubital fossa cellulitis, superficial 7. Fluids/Electrolytes/Nutrition: Routine I&O's   changed to lactose-free diet- has helped with abdominal discomfort  poor intake, started Megace, this may also be cognitive based--eating 100% of meals yesterday 8. Hypertension. Norvasc 10 mg daily, Lopressor 75 mg twice a day.   Monitor with increased mobility  Controlled for the most part 9. History of alcohol use.  10. Hyperlipidemia. Pravachol 11. Mood. Zoloft 50 mg daily. Provide emotional support 12. Urinary tract infection completed 7 days of Keflex  13. Cognitive deficits post stroke continue speech therapy (appear to be improving) 14. Spasticity  Tizanidine, increased to 67m TID on 7/7  Baclofen 571mTID started on 7/4, increased to 20U on 7/5  Cont ROM   LOS (Days) 15 A FACE TO FACE EVALUATION WAS PERFORMED  Ankit AnLorie Phenix/01/2016, 12:00 PM

## 2016-01-29 NOTE — Progress Notes (Signed)
Speech Language Pathology Weekly Progress and Session Note  Patient Details  Name: Jon [name] MRN: [id number] Date of Birth: 06-25-1956  Beginning of progress report period: January 22, 2016 End of progress report period: January 29, 2016  Today's Date: 01/29/2016 SLP Individual Time: 0800-0900 SLP Individual Time Calculation (min): 60 min  Short Term Goals: Week 2: SLP Short Term Goal 1 (Week 2): Pt will sustain his attention to basic tasks for 3-5 minute intervals with mod assist verbal cues for redirection.  SLP Short Term Goal 1 - Progress (Week 2): Met SLP Short Term Goal 2 (Week 2): Pt will verbalize at least 2 deficits s/p CVA with min assist question cues.   SLP Short Term Goal 2 - Progress (Week 2): Met SLP Short Term Goal 3 (Week 2): Pt will be intelligible at the conversational level with min assist verbal cues for overarticulation, increased vocal intensity and slow rate of speech.   SLP Short Term Goal 3 - Progress (Week 2): Met SLP Short Term Goal 4 (Week 2): Pt will consume dys 3 textures and thin liquids with min assist verbal cues for use of swallowing precautions.   SLP Short Term Goal 4 - Progress (Week 2): Met SLP Short Term Goal 5 (Week 2): Pt will initiate visual scanning to the left of midline during basic, famliar tasks with min assist verbal cues in 50% of opportunities.   SLP Short Term Goal 5 - Progress (Week 2): Met    New Short Term Goals: Week 3: SLP Short Term Goal 1 (Week 3): Pt will demonstrate ability to write at the single word level with mod A. SLP Short Term Goal 2 (Week 3): Pt will initiate scanning to L in functional tasks with min verbal cueing.  SLP Short Term Goal 3 (Week 3): Pt will tolerate trials of regular texture with adequate mastication and oral clearance at supervision level. SLP Short Term Goal 4 (Week 3): Pt will demonstrate sustained attention to task for 30 minutes with min cueing.  SLP Short Term Goal 5 (Week 3): Pt will verbalize and  demonstrate compensatory strategies to complete functional tasks with min A.  Weekly Progress Updates:  Pt met 5 of 5 short term goals with gradual improvement in attention and willingness to participate in therapy. Pt would continue to benefit from intensive SLP therapy and goals are for attention to the L and sustained attention as pt continues to frequently self-direct in an attempt to divert therapy. Focus on compensatory strategies to maximize independence in the home environment, but definitely anticipate requirement for 24/7 supervision upon d/c.   Intensity: Minumum of 1-2 x/day, 30 to 90 minutes Frequency: 3 to 5 out of 7 days Duration/Length of Stay: 24-27 days (ELOS 7/13) Treatment/Interventions: Cognitive remediation/compensation;Cueing hierarchy;Dysphagia/aspiration precaution training;Environmental controls;Internal/external aids;Multimodal communication approach;Patient/family education   Daily Session  Skilled Therapeutic Interventions: Pt's spouse requested that we address writing in speech therapy- particularly pt's ability to sign name. Pt was able to print name with a model to copy, but when attempting to sign pt wrote one legible letter then perseverated on looping. Introduced compensatory strategy of organization with a midpoint line. Pt able to accurately count coins with mod A for pacing with strategy and practice use of strategy. Scanning to the L for functional reading task completed with min A for encouragement of slowed rate of work to improve accuracy. Discussed using finger to track or a blank sheet of paper to simplify visual field.  Function:   Eating Eating                 Cognition Comprehension Comprehension assist level: Understands basic 90% of the time/cues < 10% of the time  Expression   Expression assist level: Expresses basic 90% of the time/requires cueing < 10% of the time.  Social Interaction Social Interaction assist level: Interacts  appropriately 50 - 74% of the time - May be physically or verbally inappropriate.  Problem Solving Problem solving assist level: Solves basic 50 - 74% of the time/requires cueing 25 - 49% of the time  Memory Memory assist level: Recognizes or recalls 75 - 89% of the time/requires cueing 10 - 24% of the time   General    Pain Pain Assessment Pain Assessment: No/denies pain  Therapy/Group: Individual Therapy  Vinetta Bergamo MA, CCC-SLP 01/29/2016, 11:28 AM

## 2016-01-29 NOTE — Progress Notes (Signed)
Occupational Therapy Weekly Progress Note  Patient Details  Name: Jon [name] MRN: [id number] Date of Birth: 13-Dec-1955  Beginning of progress report period: January 22, 2016 End of progress report period: January 29, 2016  Today's Date: 01/29/2016 OT Individual Time: 4235-3614 and 1418-1500 OT Individual Time Calculation (min): 68 min and 42 min   Patient has met 4 of 5 short term goals.  Pt is making steady progress towards goals.  Pt continues to demonstrate decreased sustained attention with all self-care tasks, he is both internally and externally distracted and requires increased cues and times to attend to tasks.  Pt continues to demonstrate Lt neglect, however will scan to Lt of midline with min cues to locate items during functional tasks, however mod cues to locate toilet or wheelchair.  Pt currently requires mod assist with transfers and sit > stand.  Pt continues to demonstrate Brunnstrum stage 1 in Lt arm and hand at this time, requiring cues to integrate at steady assist or to even position in sitting.  Have begun hands on education with pt's wife for transfers, with pt's wife requiring mod cues for sequencing and safety.  Continue to address deficits and increase hands on family education in preparation for d/c.  Patient continues to demonstrate the following deficits: decreased balance, decreased awareness, decreased attention, decreased LUE and LLE strength and functional use, left neglect and therefore will continue to benefit from skilled OT intervention to enhance overall performance with BADL and Reduce care partner burden.  Patient progressing toward long term goals..  Continue plan of care.  OT Short Term Goals Week 2:  OT Short Term Goal 1 (Week 2): Pt will complete transfer into tub/shower with tub bench and mod assist squat pivot of one caregiver. OT Short Term Goal 1 - Progress (Week 2): Met OT Short Term Goal 2 (Week 2): Pt will complete bathing with mod assist of one  caregiver sit to stand. OT Short Term Goal 2 - Progress (Week 2): Met OT Short Term Goal 3 (Week 2): Pt will complete LB dressing with max assist of one caregiver sit to stand. OT Short Term Goal 3 - Progress (Week 2): Progressing toward goal OT Short Term Goal 4 (Week 2): Pt will complete toilet transfer with mod assist stand pivot to the 3:1. OT Short Term Goal 4 - Progress (Week 2): Met OT Short Term Goal 5 (Week 2): Pt/family will return demonstrate safe performance of PROM exercises for the LUE following handout.   OT Short Term Goal 5 - Progress (Week 2): Met Week 3:  OT Short Term Goal 1 (Week 3): STG = LTGs due to remaining LOS  Skilled Therapeutic Interventions/Progress Updates:    1) Treatment session with focus on functional transfers, sit > stand, and family education.  Pt declined bathing at shower level, however reported need to toilet.  Performed squat pivot transfer to Lt to toilet with mod assist and mod verbal cues for sequencing.  Mod assist for standing balance with tactile cues to Lt knee for support and to maintain upright standing balance, while pt doffed pants.  Continent BM on toilet, pt required assist to complete hygiene in standing and to pull pants over Lt hip.  UB bathing completed at sink with max cues for attention to task and orientation and sequencing of donning shirt.  Educated wife on hemi-dressing technique, she reports understanding.  Performed tub/shower transfer in ADL apt with focus on technique with tub transfer bench.  Discussed equipment setup with  pt and wife and recommendations for grab bar placement.  Completed transfer with mod assist and mod cues for sequencing and attention to task.  Pt continues to demonstrate difficulty with sustained attention which impacts his ability to complete transfers and self-care tasks.  2) Treatment session with focus on educating wife on LUE PROM and positioning.  Provided pt with handouts regarding self-care as a caregiver,  arm care, and transfers as well as PROM, as she had asked for written information.  Therapist educated wife on proper handling techniques for RUE PROM at bed level with demonstration, to which pt's wife returned demonstration requiring min cues initially but then able to complete without additional cues.    Therapy Documentation Precautions:  Precautions Precautions: Fall Precaution Comments: L hemiparesis/flaccid, right head turn Restrictions Weight Bearing Restrictions: No General:   Vital Signs: Therapy Vitals Temp: 98.9 F (37.2 C) Temp Source: Oral Pulse Rate: (!) 54 Resp: 18 BP: (!) 142/69 mmHg Patient Position (if appropriate): Lying Oxygen Therapy SpO2: 98 % O2 Device: Not Delivered Pain:  Pt with no c/o pain  See Function Navigator for Current Functional Status.   Therapy/Group: Individual Therapy  Simonne Come 01/29/2016, 9:01 AM

## 2016-01-29 NOTE — Progress Notes (Signed)
Physical Therapy Session Note  Patient Details  Name: Jon [name] MRN: [id number] Date of Birth: 24-May-1956  Today's Date: 01/29/2016 PT Individual Time: 1120-1202 PT Individual Time Calculation (min): 42 min   Short Term Goals: Week 2:  PT Short Term Goal 1 (Week 2): Pt will perform transfers w/c <>bed with minA PT Short Term Goal 1 - Progress (Week 2): Progressing toward goal PT Short Term Goal 2 (Week 2): Pt will perform bed mobility with S PT Short Term Goal 2 - Progress (Week 2): Progressing toward goal PT Short Term Goal 3 (Week 2): Pt will ambulate 47' with modA and LRAD PT Short Term Goal 3 - Progress (Week 2): Met PT Short Term Goal 4 (Week 2): Pt will propel w/c minA with R hemi technique PT Short Term Goal 4 - Progress (Week 2): Met PT Short Term Goal 5 (Week 2): Pt will initiate stair training PT Short Term Goal 5 - Progress (Week 2): Not met Week 3:  PT Short Term Goal 1 (Week 3): =LTG due to estimated LOS  Skilled Therapeutic Interventions/Progress Updates:    Patient received sitting in [facility] and agreeable to PT.  Patient transported to rehab gym with total A for time management.   Sit<>stand with hemi walker x 4 with min A from PT and mod cues for proper UE positioning  Squat pivot transfer with Min A from PT x 3 throughout treatment with min cues for UE positioning.   Lateral and cross body reaching with cues for improved trunkal activation x 8 Both directions with R UE.   Gait training for 31f with hemi walk and mod A from PT for LLE limb advancement.   [facility]   Therapy Documentation Precautions:  Precautions Precautions: Fall Precaution Comments: L hemiparesis/flaccid, right head turn Restrictions Weight Bearing Restrictions: No General:   Vital Signs:  Pain: Pain Assessment Pain Assessment: No/denies pain  Therapy/Group: Individual Therapy  ALorie Phenix7/01/2016, 3:08 PM

## 2016-01-30 ENCOUNTER — Inpatient Hospital Stay (HOSPITAL_COMMUNITY): Payer: PRIVATE HEALTH INSURANCE | Admitting: Occupational Therapy

## 2016-01-30 DIAGNOSIS — G8104 Flaccid hemiplegia affecting left nondominant side: Secondary | ICD-10-CM

## 2016-01-30 DIAGNOSIS — G8110 Spastic hemiplegia affecting unspecified side: Secondary | ICD-10-CM | POA: Insufficient documentation

## 2016-01-30 NOTE — Progress Notes (Addendum)
Occupational Therapy Session Note  Patient Details  Name: Jon [name] MRN: [id number] Date of Birth: 01-07-1956  Today's Date: 01/30/2016 OT Individual Time:  - [phone]  (60  min)      Short Term Goals: Week 1:  OT Short Term Goal 1 (Week 1): Pt will complete squat pivot to toilet with mod assist of one caregiver OT Short Term Goal 1 - Progress (Week 1): Met OT Short Term Goal 2 (Week 1): Pt will complete transfer into tub/shower with tub bench and mod assist of one caregiver OT Short Term Goal 2 - Progress (Week 1): Not met OT Short Term Goal 3 (Week 1): Pt will complete bathing with mod assist of one caregiver OT Short Term Goal 3 - Progress (Week 1): Not met OT Short Term Goal 4 (Week 1): Pt will complete LB dressing with max assist of one caregiver OT Short Term Goal 4 - Progress (Week 1): Not met OT Short Term Goal 5 (Week 1): Pt will scan to Lt visual field to locate items to complete self-care tasks with min cues and increased time OT Short Term Goal 5 - Progress (Week 1): Not met Week 2:  OT Short Term Goal 1 (Week 2): Pt will complete transfer into tub/shower with tub bench and mod assist squat pivot of one caregiver. OT Short Term Goal 1 - Progress (Week 2): Met OT Short Term Goal 2 (Week 2): Pt will complete bathing with mod assist of one caregiver sit to stand. OT Short Term Goal 2 - Progress (Week 2): Met OT Short Term Goal 3 (Week 2): Pt will complete LB dressing with max assist of one caregiver sit to stand. OT Short Term Goal 3 - Progress (Week 2): Progressing toward goal OT Short Term Goal 4 (Week 2): Pt will complete toilet transfer with mod assist stand pivot to the 3:1. OT Short Term Goal 4 - Progress (Week 2): Met OT Short Term Goal 5 (Week 2): Pt/family will return demonstrate safe performance of PROM exercises for the LUE following handout.   OT Short Term Goal 5 - Progress (Week 2): Met Week 3:  OT Short Term Goal 1 (Week 3): STG = LTGs due to remaining  LOS  Skilled Therapeutic Interventions/Progress Updates:    Focus of treatment was bed mobility, transfers,  Neuro-muscular reeducation, sitting balance, standing balance, attention, therapeutic activities, sustained attention, postural control.  Ppt sitting in wc with wife present.  Pt had already bathed and dressed, but wife stated he needed to use bathroom.  Transferred from wc to toilet with mod assist with OT providing education about techniques and strategies.  Educated pt/wife on attending to left side when conversing during the day.  Pt with head turned to right during session, but OT provided manual assistance for pt to look to left.  Stood at sink x 4 with mod assist for grooming.  Returned to wc with safety belt and left at nursing station.      Therapy Documentation Precautions:  Precautions Precautions: Fall Precaution Comments: L hemiparesis/flaccid, right head turn Restrictions Weight Bearing Restrictions: No      Pain:  1/10    See Function Navigator for Current Functional Status.   Therapy/Group: Individual Therapy  Lisa Roca 01/30/2016, 5:07 PM

## 2016-01-30 NOTE — Progress Notes (Signed)
Subjective/Complaints: Pt laying in bed. He states he feels less tight this AM.    Review of systems: denies chest pain shortness of breath nausea vomiting diarrhea and constipation  Objective: Vital Signs: Blood pressure 159/77, pulse 61, temperature 98.4 F (36.9 C), temperature source Oral, resp. rate 20, weight 90.7 kg (199 lb 15.3 oz), SpO2 97 %. No results found. No results found for this or any previous visit (from the past 72 hour(s)).  HEENT: Normocephalic, atraumatic.  Cardio: RRR Resp: CTA B/L and unlabored GI: BS positive and mild epigastric distention and tenderness Skin:   Warm and dry.  Neuro:Cranial Nerve Abnormalities left central 7 Right facial weakness  Sensory: Diminished sensation left upper and left lower limb Motor: LUE/LLE; 0/5  RUE/RLE: 5/5 right upper extremity  Tone:  left elbow flexor 1/4, left finger flexors 1/4 as well left knee flexors Ashworth grade 2/4  Musc/Skel: No edema. No tenderness.  Gen. no acute distress. Vital signs reviewed.  Psych: Mood and affect normal, distracted  Assessment/Plan: 1. Functional deficits secondary to right hemiparesis causing left hemiplegia which require 3+ hours per day of interdisciplinary therapy in a comprehensive inpatient rehab setting. Physiatrist is providing close team supervision and 24 hour management of active medical problems listed below. Physiatrist and rehab team continue to assess barriers to discharge/monitor patient progress toward functional and medical goals. FIM: Function - Bathing Position: Wheelchair/chair at sink Body parts bathed by patient: Left arm, Chest, Abdomen (only completed UB bathing) Body parts bathed by helper: Right arm Bathing not applicable: Left lower leg, Right lower leg Assist Level: Touching or steadying assistance(Pt > 75%)  Function- Upper Body Dressing/Undressing Upper body dressing/undressing activity did not occur: Refused What is the patient wearing?: Pull over  shirt/dress Pull over shirt/dress - Perfomed by patient: Thread/unthread right sleeve, Put head through opening, Pull shirt over trunk Pull over shirt/dress - Perfomed by helper: Thread/unthread left sleeve Assist Level: Touching or steadying assistance(Pt > 75%) Function - Lower Body Dressing/Undressing What is the patient wearing?: Socks, Shoes, AFO Position: Wheelchair/chair at sink Pants- Performed by patient: Pull pants up/down Pants- Performed by helper: Thread/unthread right pants leg, Thread/unthread left pants leg Non-skid slipper socks- Performed by helper: Don/doff right sock, Don/doff left sock Socks - Performed by helper: Don/doff right sock, Don/doff left sock Shoes - Performed by patient: Don/doff right shoe Shoes - Performed by helper: Don/doff left shoe AFO - Performed by helper: Don/doff left AFO Assist for footwear: Maximal assist Assist for lower body dressing:  (max assist)  Function - Toileting Toileting activity did not occur: No continent bowel/bladder event Toileting steps completed by patient: Adjust clothing prior to toileting Toileting steps completed by helper: Adjust clothing prior to toileting, Performs perineal hygiene, Adjust clothing after toileting Toileting Assistive Devices: Grab bar or rail Assist level: Two helpers  Function - Archivist transfer activity did not occur: Safety/medical concerns Toilet transfer assistive device: Engineer, civil (consulting) lift: Stedy Assist level to toilet: Moderate assist (Pt 50 - 74%/lift or lower) Assist level from toilet: Moderate assist (Pt 50 - 74%/lift or lower)  Function - Chair/bed transfer Chair/bed transfer method: Squat pivot Chair/bed transfer assist level: Touching or steadying assistance (Pt > 75%) Chair/bed transfer assistive device: Armrests Chair/bed transfer details: Verbal cues for technique, Verbal cues for precautions/safety  Function - Locomotion: Wheelchair Will patient use  wheelchair at discharge?: Yes Type: Manual Max wheelchair distance: 119ft  Assist Level: Supervision or verbal cues Assist Level: Supervision or verbal cues Wheel  150 feet activity did not occur: Safety/medical concerns Turns around,maneuvers to table,bed, and toilet,negotiates 3% grade,maneuvers on rugs and over doorsills: No Function - Locomotion: Ambulation Ambulation activity did not occur: Safety/medical concerns Assistive device: Walker-hemi Max distance: 8325ft Assist level: Moderate assist (Pt 50 - 74%) Assist level: Moderate assist (Pt 50 - 74%) Walk 50 feet with 2 turns activity did not occur: Safety/medical concerns Walk 150 feet activity did not occur: Safety/medical concerns Walk 10 feet on uneven surfaces activity did not occur: Safety/medical concerns  Function - Comprehension Comprehension: Auditory Comprehension assist level: Understands basic 90% of the time/cues < 10% of the time  Function - Expression Expression: Verbal Expression assist level: Expresses basic 90% of the time/requires cueing < 10% of the time.  Function - Social Interaction Social Interaction assist level: Interacts appropriately 50 - 74% of the time - May be physically or verbally inappropriate.  Function - Problem Solving Problem solving assist level: Solves basic 50 - 74% of the time/requires cueing 25 - 49% of the time  Function - Memory Memory assist level: Recognizes or recalls 75 - 89% of the time/requires cueing 10 - 24% of the time Patient normally able to recall (first 3 days only): Current season, That he or she is in a hospital  Medical Problem List and Plan: 1.Left hemiplegia secondary to right basal ganglia hemorrhage secondary to hypertensive crisis    Continue CIR   PRAFO, splints  2.  DVT Prophylaxis/Anticoagulation: Subcutaneous heparin initiated 01/10/2016. Monitor for any bleeding episodes 3. Pain Management/headaches: Tylenol 3 as needed, Post stroke headaches started  Topamax 25 twice a day, topical analgesic cream 4. Dysphagia. Dysphagia 3 thin liquid diet being tolerated. 5. Neuropsych: This patient is not fully capable of making decisions on his own behalf. 6. Skin/Wound Care: Routine skin checks, Completed Keflex for left antecubital fossa cellulitis, superficial 7. Fluids/Electrolytes/Nutrition: Routine I&O's   changed to lactose-free diet- has helped with abdominal discomfort  poor intake, started Megace, this may also be cognitive based--eating 90-100% of meals yesterday 8. Hypertension. Norvasc 10 mg daily, Lopressor 75 mg twice a day.   Monitor with increased mobility  Controlled for the most part 9. History of alcohol use.  10. Hyperlipidemia. Pravachol 11. Mood. Zoloft 50 mg daily. Provide emotional support 12. Urinary tract infection completed 7 days of Keflex  13. Cognitive deficits post stroke continue speech therapy (appear to be improving) 14. Spasticity  Tizanidine, increased to 4mg  TID on 7/7  Baclofen 5mg  TID started on 7/4, increased to 20U on 7/5  Cont ROM   LOS (Days) 16 A FACE TO FACE EVALUATION WAS PERFORMED  Kemesha Mosey Karis JubaAnil Yaseen Gilberg 01/30/2016, 9:07 AM

## 2016-01-31 ENCOUNTER — Inpatient Hospital Stay (HOSPITAL_COMMUNITY): Payer: PRIVATE HEALTH INSURANCE | Admitting: Occupational Therapy

## 2016-01-31 MED ORDER — BACLOFEN 10 MG PO TABS
30.0000 mg | ORAL_TABLET | Freq: Three times a day (TID) | ORAL | Status: DC
  Administered 2016-01-31 – 2016-02-04 (×13): 30 mg via ORAL
  Filled 2016-01-31 (×13): qty 3

## 2016-01-31 NOTE — Progress Notes (Signed)
Subjective/Complaints: Pt laying in bed.  He does not have any of his braces on.  Educated nursing.     Review of systems: denies chest pain shortness of breath nausea vomiting diarrhea and constipation  Objective: Vital Signs: Blood pressure 131/67, pulse 50, temperature 98.4 F (36.9 C), temperature source Oral, resp. rate 20, weight 90.7 kg (199 lb 15.3 oz), SpO2 100 %. No results found. No results found for this or any previous visit (from the past 72 hour(s)).  HEENT: Normocephalic, atraumatic.  Cardio: RRR Resp: CTA B/L and unlabored GI: BS positive and mild epigastric distention and tenderness Skin:   Warm and dry.  Neuro:Cranial Nerve Abnormalities left central 7 Right facial weakness  Sensory: Diminished sensation left upper and left lower limb Motor: LUE/LLE; 0/5  RUE/RLE: 5/5 right upper extremity  Tone:  left elbow flexor 1+/4, left finger flexors 1/4 as well left knee flexors Ashworth grade 1+/4  LLE: +Clonus Musc/Skel: No edema. No tenderness.  Gen. no acute distress. Vital signs reviewed.  Psych: Mood and affect normal, distracted  Assessment/Plan: 1. Functional deficits secondary to right hemiparesis causing left hemiplegia which require 3+ hours per day of interdisciplinary therapy in a comprehensive inpatient rehab setting. Physiatrist is providing close team supervision and 24 hour management of active medical problems listed below. Physiatrist and rehab team continue to assess barriers to discharge/monitor patient progress toward functional and medical goals. FIM: Function - Bathing Position: Wheelchair/chair at sink Body parts bathed by patient: Left arm, Chest, Abdomen (only completed UB bathing) Body parts bathed by helper: Right arm Bathing not applicable: Left lower leg, Right lower leg Assist Level: Touching or steadying assistance(Pt > 75%)  Function- Upper Body Dressing/Undressing Upper body dressing/undressing activity did not occur:  Refused What is the patient wearing?: Pull over shirt/dress Pull over shirt/dress - Perfomed by patient: Thread/unthread right sleeve, Put head through opening, Pull shirt over trunk Pull over shirt/dress - Perfomed by helper: Thread/unthread left sleeve Assist Level: Touching or steadying assistance(Pt > 75%) Function - Lower Body Dressing/Undressing What is the patient wearing?: Socks, Shoes, AFO Position: Wheelchair/chair at sink Pants- Performed by patient: Pull pants up/down Pants- Performed by helper: Thread/unthread right pants leg, Thread/unthread left pants leg Non-skid slipper socks- Performed by helper: Don/doff right sock, Don/doff left sock Socks - Performed by helper: Don/doff right sock, Don/doff left sock Shoes - Performed by patient: Don/doff right shoe Shoes - Performed by helper: Don/doff left shoe AFO - Performed by helper: Don/doff left AFO Assist for footwear: Maximal assist Assist for lower body dressing:  (max assist)  Function - Toileting Toileting activity did not occur: No continent bowel/bladder event Toileting steps completed by patient: Adjust clothing prior to toileting Toileting steps completed by helper: Performs perineal hygiene, Adjust clothing after toileting Toileting Assistive Devices: Grab bar or rail Assist level: Touching or steadying assistance (Pt.75%)  Function - Archivist transfer activity did not occur: Safety/medical concerns Toilet transfer assistive device: Engineer, civil (consulting) lift: Stedy Assist level to toilet: Moderate assist (Pt 50 - 74%/lift or lower) Assist level from toilet: Moderate assist (Pt 50 - 74%/lift or lower)  Function - Chair/bed transfer Chair/bed transfer method: Squat pivot Chair/bed transfer assist level: Touching or steadying assistance (Pt > 75%) Chair/bed transfer assistive device: Armrests Chair/bed transfer details: Verbal cues for technique, Verbal cues for precautions/safety  Function -  Locomotion: Wheelchair Will patient use wheelchair at discharge?: Yes Type: Manual Max wheelchair distance: 144ft  Assist Level: Supervision or verbal cues Assist  Level: Supervision or verbal cues Wheel 150 feet activity did not occur: Safety/medical concerns Turns around,maneuvers to table,bed, and toilet,negotiates 3% grade,maneuvers on rugs and over doorsills: No Function - Locomotion: Ambulation Ambulation activity did not occur: Safety/medical concerns Assistive device: Walker-hemi Max distance: 4725ft Assist level: Moderate assist (Pt 50 - 74%) Assist level: Moderate assist (Pt 50 - 74%) Walk 50 feet with 2 turns activity did not occur: Safety/medical concerns Walk 150 feet activity did not occur: Safety/medical concerns Walk 10 feet on uneven surfaces activity did not occur: Safety/medical concerns  Function - Comprehension Comprehension: Auditory Comprehension assist level: Understands basic 90% of the time/cues < 10% of the time  Function - Expression Expression: Verbal Expression assist level: Expresses basic 90% of the time/requires cueing < 10% of the time.  Function - Social Interaction Social Interaction assist level: Interacts appropriately 50 - 74% of the time - May be physically or verbally inappropriate.  Function - Problem Solving Problem solving assist level: Solves basic 50 - 74% of the time/requires cueing 25 - 49% of the time  Function - Memory Memory assist level: Recognizes or recalls 75 - 89% of the time/requires cueing 10 - 24% of the time Patient normally able to recall (first 3 days only): Current season, That he or she is in a hospital  Medical Problem List and Plan: 1.Left hemiplegia secondary to right basal ganglia hemorrhage secondary to hypertensive crisis    Continue CIR   PRAFO, splints  2.  DVT Prophylaxis/Anticoagulation: Subcutaneous heparin initiated 01/10/2016. Monitor for any bleeding episodes 3. Pain Management/headaches: Tylenol 3 as  needed, Post stroke headaches started Topamax 25 twice a day, topical analgesic cream 4. Dysphagia. Dysphagia 3 thin liquid diet being tolerated. 5. Neuropsych: This patient is not fully capable of making decisions on his own behalf. 6. Skin/Wound Care: Routine skin checks, Completed Keflex for left antecubital fossa cellulitis, superficial 7. Fluids/Electrolytes/Nutrition: Routine I&O's   changed to lactose-free diet- has helped with abdominal discomfort  poor intake, started Megace, this may also be cognitive based--eating 75-100% of meals yesterday 8. Hypertension. Norvasc 10 mg daily, Lopressor 75 mg twice a day.   Monitor with increased mobility  Controlled for the most part 9. History of alcohol use.  10. Hyperlipidemia. Pravachol 11. Mood. Zoloft 50 mg daily. Provide emotional support 12. Urinary tract infection completed 7 days of Keflex  13. Cognitive deficits post stroke continue speech therapy (stable/improving) 14. Spasticity  Tizanidine, increased to 4mg  TID on 7/7  Baclofen 5mg  TID started on 7/4, increased to 20U on 7/5, increased to 30U TID on 7/9  Cont ROM  Improving   LOS (Days) 17 A FACE TO FACE EVALUATION WAS PERFORMED  Bayla Mcgovern Karis JubaAnil Codey Burling 01/31/2016, 8:12 AM

## 2016-01-31 NOTE — Progress Notes (Signed)
Occupational Therapy Session Note  Patient Details  Name: Jon [name] MRN: [id number] Date of Birth: 1956/05/31  Today's Date: 01/31/2016 OT Individual Time: 1130-1240 OT Individual Time Calculation (min): 70 min     Skilled Therapeutic Interventions/Progress Updates: Patient seen with wife present.   Pt very distracted and distractable.   Wife very nice and supportive but talked most of the session.   Patient even more distracted by wife or clinician by actions or speech.    Pt required constant cues to initiate and focus.  He completed bathing and dressing with focus on postural control, dynamic balance and attending to left environment and body for bathing and dressing.  Overal pt. Performed at moderate assistance with max verbal cueing to focus,attend and for task completion.   Wife was reminded again to talk to and interact with patient on his left due to right gaze preference and left inattention.     Therapy Documentation Precautions:  Precautions Precautions: Fall Precaution Comments: L hemiparesis/flaccid, right head turn Restrictions Weight Bearing Restrictions: No  Pain:denied    See Function Navigator for Current Functional Status.   Therapy/Group: Individual Therapy  Rozelle Loganickett, Jon [name] 01/31/2016, 4:39 PM

## 2016-02-01 ENCOUNTER — Inpatient Hospital Stay (HOSPITAL_COMMUNITY): Payer: PRIVATE HEALTH INSURANCE | Admitting: Speech Pathology

## 2016-02-01 ENCOUNTER — Encounter (HOSPITAL_COMMUNITY): Payer: PRIVATE HEALTH INSURANCE

## 2016-02-01 ENCOUNTER — Inpatient Hospital Stay (HOSPITAL_COMMUNITY): Payer: PRIVATE HEALTH INSURANCE | Admitting: Occupational Therapy

## 2016-02-01 ENCOUNTER — Inpatient Hospital Stay (HOSPITAL_COMMUNITY): Payer: PRIVATE HEALTH INSURANCE | Admitting: Physical Therapy

## 2016-02-01 MED ORDER — DICLOFENAC SODIUM 1 % TD GEL
2.0000 g | Freq: Four times a day (QID) | TRANSDERMAL | Status: DC
Start: 2016-02-01 — End: 2016-02-04
  Administered 2016-02-01 – 2016-02-04 (×5): 2 g via TOPICAL
  Filled 2016-02-01: qty 100

## 2016-02-01 NOTE — Progress Notes (Signed)
Orthopedic Tech Progress Note Patient Details:  Jon [name] [birth year] [id number]  Patient ID: Jon [name], male   DOB: [birth year], 60 y.o.   MRN: [id number]   [name] 02/01/2016, 3:17 PMCalled [facility] for left AFO.

## 2016-02-01 NOTE — Progress Notes (Signed)
Physical Therapy Session Note  Patient Details  Name: Jon CoolerGeorge D Shroff MRN: [id number] Date of Birth: 11-27-1955  Today's Date: 02/01/2016 PT Individual Time: 1300-1415 PT Individual Time Calculation (min): 75 min   Short Term Goals: Week 3:  PT Short Term Goal 1 (Week 3): =LTG due to estimated LOS  Skilled Therapeutic Interventions/Progress Updates:    Pt received seated in w/c, denies pain and agreeable to treatment. Transfer w/c <>toilet with wife providing modA. Sit <>stand for wife to don/doff pants/brief with totalA; pt initially requiring maxA in standing but reduced to min guard/minA when cued for upright standing. W/c propulsion x150' with S and wife providing min verbal cues for L attention and navigation. Stair ascent/descent 1x4 with therapist providing maxA for LLE stance control and progression. Wife interested in attempting stair ascent as she is having difficulty locating assistance for a ramp to enter/exit home. Attempted x2 trials with +2A for safety and wife unable to progress beyond first stair d/t poor pt standing balance and L shoulder pain. Transfer w/c >bed with wife providing modA squat pivot. Sit>supine minA. Remained supine in bed at end of session, all needs in reach.   Therapy Documentation Precautions:  Precautions Precautions: Fall Precaution Comments: L hemiparesis/flaccid, right head turn Restrictions Weight Bearing Restrictions: No Pain: Pain Assessment Pain Assessment: No/denies pain   See Function Navigator for Current Functional Status.   Therapy/Group: Individual Therapy  [name] 02/01/2016, 2:51 PM

## 2016-02-01 NOTE — Progress Notes (Signed)
Subjective/Complaints: No new problems overnight. Wife and patient met with neuropsychologist and discussed his problems with alcohol abuse.    Review of systems: denies chest pain shortness of breath nausea vomiting diarrhea and constipation  Objective: Vital Signs: Blood pressure 134/67, pulse 54, temperature 98.4 F (36.9 C), temperature source Oral, resp. rate 18, weight 90.7 kg (199 lb 15.3 oz), SpO2 100 %. No results found. No results found for this or any previous visit (from the past 72 hour(s)).  HEENT: Normocephalic, atraumatic.  Cardio: RRR Resp: CTA B/L and unlabored GI: BS positive and mild epigastric distention and tenderness Skin:   Warm and dry. Appears pale this morning Neuro:Cranial Nerve Abnormalities left central 7 Right facial weakness  Sensory: Diminished sensation left upper and left lower limb Motor: LUE/LLE; 0/5  RUE/RLE: 5/5 right upper extremity  Tone:  left elbow flexor 1/4, left finger flexors 1/4 as well left knee flexors Ashworth grade 2/4  Musc/Skel: No edema. No tenderness.  Gen. no acute distress. Vital signs reviewed.  Psych: Mood and affect normal, distracted  Assessment/Plan: 1. Functional deficits secondary to right hemiparesis causing left hemiplegia which require 3+ hours per day of interdisciplinary therapy in a comprehensive inpatient rehab setting. Physiatrist is providing close team supervision and 24 hour management of active medical problems listed below. Physiatrist and rehab team continue to assess barriers to discharge/monitor patient progress toward functional and medical goals. FIM: Function - Bathing Position: Wheelchair/chair at sink Body parts bathed by patient: Left arm, Chest, Abdomen (only completed UB bathing) Body parts bathed by helper: Right arm Bathing not applicable: Left lower leg, Right lower leg Assist Level: Touching or steadying assistance(Pt > 75%)  Function- Upper Body Dressing/Undressing Upper body  dressing/undressing activity did not occur: Refused What is the patient wearing?: Pull over shirt/dress Pull over shirt/dress - Perfomed by patient: Thread/unthread right sleeve, Put head through opening, Pull shirt over trunk Pull over shirt/dress - Perfomed by helper: Thread/unthread left sleeve Assist Level: Touching or steadying assistance(Pt > 75%) Function - Lower Body Dressing/Undressing What is the patient wearing?: Socks, Shoes, AFO Position: Wheelchair/chair at sink Pants- Performed by patient: Pull pants up/down Pants- Performed by helper: Thread/unthread right pants leg, Thread/unthread left pants leg Non-skid slipper socks- Performed by helper: Don/doff right sock, Don/doff left sock Socks - Performed by helper: Don/doff right sock, Don/doff left sock Shoes - Performed by patient: Don/doff right shoe Shoes - Performed by helper: Don/doff left shoe AFO - Performed by helper: Don/doff left AFO Assist for footwear: Maximal assist Assist for lower body dressing:  (max assist)  Function - Toileting Toileting activity did not occur: No continent bowel/bladder event Toileting steps completed by patient: Adjust clothing prior to toileting Toileting steps completed by helper: Performs perineal hygiene, Adjust clothing after toileting Toileting Assistive Devices: Grab bar or rail Assist level: Touching or steadying assistance (Pt.75%)  Function - Air cabin crew transfer activity did not occur: Safety/medical concerns Toilet transfer assistive device: Radio broadcast assistant lift: Stedy Assist level to toilet: 2 helpers Assist level from toilet: 2 helpers  Function - Chair/bed transfer Chair/bed transfer method: Squat pivot Chair/bed transfer assist level: Touching or steadying assistance (Pt > 75%) Chair/bed transfer assistive device: Armrests Chair/bed transfer details: Verbal cues for technique, Verbal cues for precautions/safety  Function - Locomotion:  Wheelchair Will patient use wheelchair at discharge?: Yes Type: Manual Max wheelchair distance: 145f  Assist Level: Supervision or verbal cues Assist Level: Supervision or verbal cues Wheel 150 feet activity did not occur:  Safety/medical concerns Turns around,maneuvers to table,bed, and toilet,negotiates 3% grade,maneuvers on rugs and over doorsills: No Function - Locomotion: Ambulation Ambulation activity did not occur: Safety/medical concerns Assistive device: Walker-hemi Max distance: 94f Assist level: Moderate assist (Pt 50 - 74%) Assist level: Moderate assist (Pt 50 - 74%) Walk 50 feet with 2 turns activity did not occur: Safety/medical concerns Walk 150 feet activity did not occur: Safety/medical concerns Walk 10 feet on uneven surfaces activity did not occur: Safety/medical concerns  Function - Comprehension Comprehension: Auditory Comprehension assist level: Understands basic 90% of the time/cues < 10% of the time  Function - Expression Expression: Verbal Expression assist level: Expresses basic 90% of the time/requires cueing < 10% of the time.  Function - Social Interaction Social Interaction assist level: Interacts appropriately 50 - 74% of the time - May be physically or verbally inappropriate.  Function - Problem Solving Problem solving assist level: Solves basic 50 - 74% of the time/requires cueing 25 - 49% of the time  Function - Memory Memory assist level: Recognizes or recalls 75 - 89% of the time/requires cueing 10 - 24% of the time Patient normally able to recall (first 3 days only): Current season, That he or she is in a hospital  Medical Problem List and Plan: 1.Left hemiplegia secondary to right basal ganglia hemorrhage secondary to hypertensive crisis    Continue CIR   PRAFO, splints  2.  DVT Prophylaxis/Anticoagulation: Subcutaneous heparin initiated 01/10/2016. Monitor for any bleeding episodes 3. Pain Management/headaches: Tylenol 3 as needed, Post  stroke headaches started Topamax 25 twice a day, topical analgesic cream 4. Dysphagia. Dysphagia 3 thin liquid diet being tolerated. 5. Neuropsych: This patient is not fully capable of making decisions on his own behalf. 6. Skin/Wound Care: Routine skin checks, Completed Keflex for left antecubital fossa cellulitis, superficial 7. Fluids/Electrolytes/Nutrition: Routine I&O's   changed to lactose-free diet- has helped with abdominal discomfort  poor intake, started Megace, this may also be cognitive based--eating 90-100% of meals  8. Hypertension. Norvasc 10 mg daily, Lopressor 75 mg twice a day.   Monitor with increased mobility  Controlled for the most part, monitor for hypotension with increase of tizanidine 9. History of alcohol use. Discussed need for continued cessation after discharge to home 10. Hyperlipidemia. Pravachol 11. Mood. Zoloft 50 mg daily. Provide emotional support 12. Urinary tract infection completed 7 days of Keflex  13. Cognitive deficits post stroke continue speech therapy still has poor awareness of deficits 14. Spasticity  Tizanidine, increased to 439mTID on 7/7  Baclofen 23m28mID started on 7/4, increased to 20U on 7/5  Cont ROM   LOS (Days) 18 A FACE TO FACE EVALUATION WAS PERFORMED  KIRSTEINS,ANDREW E 02/01/2016, 10:08 AM

## 2016-02-01 NOTE — Progress Notes (Signed)
Occupational Therapy Session Note  Patient Details  Name: Jon CoolerGeorge D Swiderski MRN: [id number] Date of Birth: [birth year]  Today's Date: 02/01/2016 OT Individual Time: [id number] OT Individual Time Calculation (min): 62 min    Short Term Goals: Week 3:  OT Short Term Goal 1 (Week 3): STG = LTGs due to remaining LOS  Skilled Therapeutic Interventions/Progress Updates:    Treatment session with focus on attention to task, functional transfers, and sit > stand during self-care tasks.  Pt in bed upon arrival, wife reporting pt need to toilet.  Max multimodal cues for bed mobility and attention to task, once in sidelying pt able to complete sidelying to sit min assist with cues for encouragement.  Squat pivot transfer to w/c and to toilet with mod assist and mod cues for sequencing and attention to task.  Able to stand to pull down pants with min assist for standing balance.  Bathing completed in room shower with mod cues for attention to task and sequencing of bathing.  Mod assist sit > stand for therapist to wash buttocks.  Pt internally and externally distracted, requiring cues and increased time to return to attention to task.  Applied kinesiotape to Lt shoulder at deltoid to improve positioning and decrease subluxation, provided education to pt and pt's wife regarding rationale and improved positioning.  Pt able to recall hemi-dressing technique, however continues to require mod-max cues for orientation and sequencing of dressing.  Therapy Documentation Precautions:  Precautions Precautions: Fall Precaution Comments: L hemiparesis/flaccid, right head turn Restrictions Weight Bearing Restrictions: No Pain: Pain Assessment Pain Assessment: No/denies pain Pain Score: 0-No pain Pain Type: Acute pain Pain Location: Shoulder Pain Orientation: Left Pain Descriptors / Indicators: Aching Patients Stated Pain Goal: 2 Pain Intervention(s): Medication (See eMAR)  See Function Navigator for Current  Functional Status.   Therapy/Group: Individual Therapy  [name], Cornell Bourbon 02/01/2016, 12:32 PM

## 2016-02-01 NOTE — Progress Notes (Signed)
Speech Language Pathology Daily Session Note  Patient Details  Name: Jon [name] MRN: [id number] Date of Birth: [birth year]  Today's Date: 02/01/2016 SLP Individual Time: 1100-1200 SLP Individual Time Calculation (min): 60 min  Short Term Goals: Week 3: SLP Short Term Goal 1 (Week 3): Pt will demonstrate ability to write at the single word level with mod A. SLP Short Term Goal 2 (Week 3): Pt will initiate scanning to L in functional tasks with min verbal cueing.  SLP Short Term Goal 3 (Week 3): Pt will tolerate trials of regular texture with adequate mastication and oral clearance at supervision level. SLP Short Term Goal 4 (Week 3): Pt will demonstrate sustained attention to task for 30 minutes with min cueing.  SLP Short Term Goal 5 (Week 3): Pt will verbalize and demonstrate compensatory strategies to complete functional tasks with min A.  Skilled Therapeutic Interventions: Skilled treatment session focused on addressing dysphagia and cognition goals. SLP facilitated session by providing advanced trials of regular textures with thin liquids via cup. Patient required extra time for mastication and Min verbal cues for effective oral clearance with use of lingual sweep, which effectively prevented overt s/s of aspiration with trials. SLP also facilitated session with Mod verbal cues to scan left to locate PO trials. Recommend a trail tray with SLP at next visit. Patient also sustained attention to a catalogue shopping task for 30 minutes with Mod verbal cues for redirection and Mod faded to Min verbal cues to scan left in order to locate given items.  Continue with current plan of care.    Function:  Eating Eating   Modified Consistency Diet: No (with trials) Eating Assist Level: Set up assist for;Supervision or verbal cues;Helper checks for pocketed food   Eating Set Up Assist For: Opening containers       Cognition Comprehension Comprehension assist level: Understands basic 90% of  the time/cues < 10% of the time  Expression   Expression assist level: Expresses basic 90% of the time/requires cueing < 10% of the time.  Social Interaction Social Interaction assist level: Interacts appropriately 50 - 74% of the time - May be physically or verbally inappropriate.  Problem Solving Problem solving assist level: Solves basic 50 - 74% of the time/requires cueing 25 - 49% of the time  Memory Memory assist level: Recognizes or recalls 75 - 89% of the time/requires cueing 10 - 24% of the time    Pain Pain Assessment Pain Assessment: No/denies pain Pain Score: 0-No pain Pain Type: Acute pain Pain Location: Shoulder Pain Orientation: Left Pain Descriptors / Indicators: Aching Patients Stated Pain Goal: 2 Pain Intervention(s): Medication (See eMAR)  Therapy/Group: Individual Therapy  Charlane FerrettiMelissa Malala Trenkamp, M.A., CCC-SLP [phone]  Cassiopeia Florentino 02/01/2016, 12:11 PM

## 2016-02-02 ENCOUNTER — Inpatient Hospital Stay (HOSPITAL_COMMUNITY): Payer: PRIVATE HEALTH INSURANCE | Admitting: Occupational Therapy

## 2016-02-02 ENCOUNTER — Inpatient Hospital Stay (HOSPITAL_COMMUNITY): Payer: PRIVATE HEALTH INSURANCE | Admitting: Speech Pathology

## 2016-02-02 ENCOUNTER — Ambulatory Visit (HOSPITAL_COMMUNITY): Payer: PRIVATE HEALTH INSURANCE | Admitting: Physical Therapy

## 2016-02-02 MED ORDER — GABAPENTIN 300 MG PO CAPS
300.0000 mg | ORAL_CAPSULE | Freq: Two times a day (BID) | ORAL | Status: DC
Start: 2016-02-02 — End: 2016-02-04
  Administered 2016-02-02 – 2016-02-04 (×5): 300 mg via ORAL
  Filled 2016-02-02 (×5): qty 1

## 2016-02-02 NOTE — Progress Notes (Signed)
Physical Therapy Note  Patient Details  Name: Tawanna CoolerGeorge D Kolbe MRN: [id number] Date of Birth: 01-29-1956 Today's Date: 02/02/2016    Time: 1100-1155 55 minutes  1:1 Pt c/o Lt shoulder pain during session, RN made aware, rest as needed.  Session focused on pt/family education with pt's wife and in-laws and brother in law.  All family members performed transfers to both sides to mat and bed with all family members able to perform without cuing from PT by end of session.  Pt min/mod A with transfers depending on fatigue.  Attempted simulated car transfer with pt and wife, pt too fatigued to perform with wife, pt able to perform with mod A with PT.  Pt and wife agreeable to attempt car transfer with PT tomorrow.   Daquisha Clermont 02/02/2016, 12:23 PM

## 2016-02-02 NOTE — Progress Notes (Signed)
Subjective/Complaints: No new problems overnight. Wife and patient met with neuropsychologist and discussed his problems with alcohol abuse.    Review of systems: denies chest pain shortness of breath nausea vomiting diarrhea and constipation  Objective: Vital Signs: Blood pressure 140/70, pulse 57, temperature 98.6 F (37 C), temperature source Oral, resp. rate 18, weight 90.7 kg (199 lb 15.3 oz), SpO2 96 %. No results found. No results found for this or any previous visit (from the past 72 hour(s)).  HEENT: Normocephalic, atraumatic.  Cardio: RRR Resp: CTA B/L and unlabored GI: BS positive and mild epigastric distention and tenderness Skin:   Warm and dry. Appears pale this morning Neuro:Cranial Nerve Abnormalities left central 7 Right facial weakness  Sensory: Diminished sensation left upper and left lower limb Motor: LUE/LLE; 0/5  RUE/RLE: 5/5 right upper extremity  Tone:  left elbow flexor 1/4, left finger flexors 1/4 as well left knee flexors Ashworth grade 2/4  Musc/Skel: No edema. No tenderness.  Gen. no acute distress. Vital signs reviewed.  Psych: Mood and affect normal, distracted  Assessment/Plan: 1. Functional deficits secondary to right hemiparesis causing left hemiplegia which require 3+ hours per day of interdisciplinary therapy in a comprehensive inpatient rehab setting. Physiatrist is providing close team supervision and 24 hour management of active medical problems listed below. Physiatrist and rehab team continue to assess barriers to discharge/monitor patient progress toward functional and medical goals. FIM: Function - Bathing Position: Shower Body parts bathed by patient: Left arm, Chest, Abdomen, Front perineal area, Right upper leg, Left upper leg Body parts bathed by helper: Right arm, Buttocks, Back Bathing not applicable: Left lower leg, Right lower leg Assist Level: Touching or steadying assistance(Pt > 75%) (Mod assist)  Function- Upper Body  Dressing/Undressing Upper body dressing/undressing activity did not occur: Refused What is the patient wearing?: Pull over shirt/dress Pull over shirt/dress - Perfomed by patient: Thread/unthread right sleeve, Put head through opening, Pull shirt over trunk Pull over shirt/dress - Perfomed by helper: Thread/unthread left sleeve Assist Level: Touching or steadying assistance(Pt > 75%) Function - Lower Body Dressing/Undressing What is the patient wearing?: Pants, Socks, Shoes, AFO Position: Wheelchair/chair at sink Pants- Performed by patient: Pull pants up/down Pants- Performed by helper: Thread/unthread right pants leg, Thread/unthread left pants leg Non-skid slipper socks- Performed by helper: Don/doff right sock, Don/doff left sock Socks - Performed by helper: Don/doff right sock, Don/doff left sock Shoes - Performed by patient: Don/doff right shoe Shoes - Performed by helper: Don/doff left shoe AFO - Performed by helper: Don/doff left AFO Assist for footwear: Maximal assist Assist for lower body dressing:  (Max assist)  Function - Toileting Toileting activity did not occur: No continent bowel/bladder event Toileting steps completed by patient: Adjust clothing prior to toileting Toileting steps completed by helper: Performs perineal hygiene, Adjust clothing after toileting, Adjust clothing prior to toileting Toileting Assistive Devices: Grab bar or rail Assist level:  (wife providing maxA)  Function - Air cabin crew transfer activity did not occur: Safety/medical concerns Toilet transfer assistive device: Radio broadcast assistant lift: Stedy Assist level to toilet: Moderate assist (Pt 50 - 74%/lift or lower) Assist level from toilet: Moderate assist (Pt 50 - 74%/lift or lower)  Function - Chair/bed transfer Chair/bed transfer method: Squat pivot Chair/bed transfer assist level: Moderate assist (Pt 50 - 74%/lift or lower) Chair/bed transfer assistive device:  Armrests Chair/bed transfer details: Verbal cues for technique, Verbal cues for precautions/safety, Manual facilitation for weight shifting, Manual facilitation for placement  Function - Locomotion:  Wheelchair Will patient use wheelchair at discharge?: Yes Type: Manual Max wheelchair distance: 127f  Assist Level: Supervision or verbal cues Assist Level: Supervision or verbal cues Wheel 150 feet activity did not occur: Safety/medical concerns Turns around,maneuvers to table,bed, and toilet,negotiates 3% grade,maneuvers on rugs and over doorsills: No Function - Locomotion: Ambulation Ambulation activity did not occur: Safety/medical concerns Assistive device: Walker-hemi Max distance: 243fAssist level: Moderate assist (Pt 50 - 74%) Assist level: Moderate assist (Pt 50 - 74%) Walk 50 feet with 2 turns activity did not occur: Safety/medical concerns Walk 150 feet activity did not occur: Safety/medical concerns Walk 10 feet on uneven surfaces activity did not occur: Safety/medical concerns  Function - Comprehension Comprehension: Auditory Comprehension assist level: Understands basic 90% of the time/cues < 10% of the time  Function - Expression Expression: Verbal Expression assist level: Expresses basic 90% of the time/requires cueing < 10% of the time.  Function - Social Interaction Social Interaction assist level: Interacts appropriately 50 - 74% of the time - May be physically or verbally inappropriate.  Function - Problem Solving Problem solving assist level: Solves basic 50 - 74% of the time/requires cueing 25 - 49% of the time  Function - Memory Memory assist level: Recognizes or recalls 75 - 89% of the time/requires cueing 10 - 24% of the time Patient normally able to recall (first 3 days only): Current season, That he or she is in a hospital  Medical Problem List and Plan: 1.Left hemiplegia secondary to right basal ganglia hemorrhage secondary to hypertensive crisis     Continue CIR ,team conference in a.m. PRAFO, splints  2.  DVT Prophylaxis/Anticoagulation: Subcutaneous heparin initiated 01/10/2016. Monitor for any bleeding episodes 3. Pain Management/headaches: Tylenol 3 as needed, Post stroke headaches started Topamax 25 twice a day, topical analgesic cream, left shoulder pain multifactorial, evidence of a capsulitis as well as anterio inferior subluxation and impingement syndrome. Patient also has  Hypersensitivity on the entire left side related to his CVA, start gabapentin 4. Dysphagia. Dysphagia 3 thin liquid diet being tolerated. 5. Neuropsych: This patient is not fully capable of making decisions on his own behalf. 6. Skin/Wound Care: Routine skin checks, Completed Keflex for left antecubital fossa cellulitis, superficial 7. Fluids/Electrolytes/Nutrition: Routine I&O's   changed to lactose-free diet- has helped with abdominal discomfort  poor intake, started Megace, this may also be cognitive based--eating 90-100% of meals , will DC Megace 8. Hypertension. Norvasc 10 mg daily, Lopressor 75 mg twice a day.   Monitor with increased mobility  Controlled for the most part, monitor for hypotension with increase of tizanidine 9. History of alcohol use. Discussed need for continued cessation after discharge to home 10. Hyperlipidemia. Pravachol 11. Mood. Zoloft 50 mg daily. Provide emotional support 12. Urinary tract infection completed 7 days of Keflex  13. Cognitive deficits post stroke continue speech therapy still has poor awareness of deficits 14. Spasticity  Tizanidine, increased to 8m40mID on 7/7  Baclofen 5mg31mD started on 7/4, increased to 20U on 7/5  Cont ROM   LOS (Days) 19 [place] WAS PER[place]  Jerian Morais E 02/02/2016, 8:46 AM

## 2016-02-02 NOTE — Progress Notes (Signed)
Occupational Therapy Session Note  Patient Details  Name: Jon CoolerGeorge D Rought MRN: [id number] Date of Birth: 1955-09-19  Today's Date: 02/02/2016 OT Individual Time: 1000-1100 and 1440-1510 OT Individual Time Calculation (min): 60 min and 30 min   Short Term Goals: Week 3:  OT Short Term Goal 1 (Week 3): STG = LTGs due to remaining LOS  Skilled Therapeutic Interventions/Progress Updates:    1) Treatment session with focus on family education with self-care tasks of dressing and functional transfers.  Pt's wife present for entire session, pt's wife's mother, father, and brother arrived midway through session.  Completed dressing task seated at EOB with education to wife regarding hemi-dressing technique and increased hands on during dressing.  Wife assisted pt with LB dressing providing assist for standing balance and assist to pull pants over Lt hip in standing after pt able to pull pants over Rt.  Pt reported need to toilet.  Wife completed squat pivot transfers bed > w/c > toilet with mod assist and min-mod cues from therapist for technique and pt and caregiver safety.  Pt completed hygiene post toileting with steady assist for sitting balance.  Mod multimodal cues for safety for wife with sit > stand post toileting to ensure caregiver and pt safety.  Initiated transfer training with pt's additional family members, educating them on Lt inattention, decreased attention span, distractibility, and fall risk to which all verbalize understanding.  Hands on education with pt's brother in law regarding squat pivot transfers, mod cues faded to min during transfers to Rt and Lt.  Pt and family passed off to PT to continue transfer training.  2) Treatment session with focus on hands on education with pt and pt's wife regarding tub/shower transfers.  Completed squat pivot transfers to/from tub bench in ADL tubroom with focus on weight shifting and proper technique as well as caregiver body mechanics to increase  safety.  Pt demonstrating improved carryover of technique with ability to direct caregiver.  Caregiver required min cues for technique, but demonstrating ability to problem solve aloud of positioning and sequencing.  Returned to room and completed toilet transfer and toileting with pt's wife providing physical assist and cues.  Therapy Documentation Precautions:  Precautions Precautions: Fall Precaution Comments: L hemiparesis/flaccid, right head turn Restrictions Weight Bearing Restrictions: No Pain: Pain Assessment Pain Assessment: No/denies pain Pain Score: 0-No pain  See Function Navigator for Current Functional Status.   Therapy/Group: Individual Therapy  [name], Alec Jaros 02/02/2016, 11:31 AM

## 2016-02-02 NOTE — Progress Notes (Signed)
Speech Language Pathology Daily Session Note  Patient Details  Name: Jon [name] MRN: [id number] Date of Birth: Dec 18, 1955  Today's Date: 02/02/2016 SLP Individual Time: 1301-1400 SLP Individual Time Calculation (min): 59 min  Short Term Goals: Week 3: SLP Short Term Goal 1 (Week 3): Pt will demonstrate ability to write at the single word level with mod A. SLP Short Term Goal 2 (Week 3): Pt will initiate scanning to L in functional tasks with min verbal cueing.  SLP Short Term Goal 3 (Week 3): Pt will tolerate trials of regular texture with adequate mastication and oral clearance at supervision level. SLP Short Term Goal 4 (Week 3): Pt will demonstrate sustained attention to task for 30 minutes with min cueing.  SLP Short Term Goal 5 (Week 3): Pt will verbalize and demonstrate compensatory strategies to complete functional tasks with min A.  Skilled Therapeutic Interventions:  Pt was seen for skilled ST targeting cognitive and dysphagia goals.  Therapist facilitated the session with a trial tray of regular textures to work towards diet advancement.  Pt consumed regular textures and thin liquids with overall supervision use of swallowing precautions.  Pt with slightly prolonged mastication of advanced textures but was able to clear residual solids from the oral cavity with extra time. Recommend diet advancement.  RN and pt's wife made aware.  During meal,  Pt was able to sustain his attention to task in a minimally distracting environment for ~5 minutes with supervision verbal cues for redirection.  Pt also benefited from supervision verbal cues for initiation and sequencing self feeding and cutting food.  Pt was left in day room with wife present providing full supervision to finish meal.  Continue per current plan of care.   Function:  Eating Eating     Eating Assist Level: Set up assist for;Supervision or verbal cues   Eating Set Up Assist For: Opening containers        Cognition Comprehension Comprehension assist level: Understands basic 90% of the time/cues < 10% of the time  Expression   Expression assist level: Expresses basic 90% of the time/requires cueing < 10% of the time.  Social Interaction Social Interaction assist level: Interacts appropriately 75 - 89% of the time - Needs redirection for appropriate language or to initiate interaction.  Problem Solving Problem solving assist level: Solves basic 50 - 74% of the time/requires cueing 25 - 49% of the time  Memory Memory assist level: Recognizes or recalls 75 - 89% of the time/requires cueing 10 - 24% of the time    Pain Pain Assessment Pain Assessment: No/denies pain  Therapy/Group: Individual Therapy  [name], Melanee SpryNicole L 02/02/2016, 4:29 PM

## 2016-02-02 NOTE — Consult Note (Signed)
  NEUROBEHAVIORAL STATUS EXAM - [facility]   [name] was seen for follow-up today for depression/anxiety, as well as to discuss maintaining sobriety upon discharge. He was again accompanied by his wife who assisted with the history.   [name] reported continued issues with cognition that includes a worsening of mathematical abilities, though his wife feels that his difficulties have generally improved. We again discussed the value of undergoing neuropsychological evaluation upon discharge and he is agreeable.   Emotionally, [name] described his current mood as "okay" though he continues to experience depression and anxious symptomology. He remains interested in counseling and I again encouraged him to consider participation in therapy upon discharge with a focus on treatment for alcoholism which was discussed at length. In fact, he is already suffering from cravings for alcohol. As such, it is more likely that he will require either inpatient treatment for alcoholism or an intensive outpatient program. Both topics were broached. [name] also reported increased nightmares since he has been sober and the relationship between past alcohol use and sleep disruption were discussed.   [name] continues to feel that he is making progress in therapy and he also feels that he has a better tolerance for pain. He is discharging Thursday and is happy to be doing so. There is some fear/trepidation involved but he is ready to go home. He remains satisfied with a majority of the rehab staff. He and his wife are in the process of obtaining POAs.   No adjustment issues endorsed. Suicidal/homicidal ideation, plan or intent was denied. No manic or hypomanic episodes were reported. The patient denied ever experiencing any auditory/visual hallucinations. No major behavioral or personality changes were endorsed.   PROCEDURES: [2 units 96116] Diagnostic clinical interview   Review of available records Mental Status Exam  MENTAL STATUS EXAM: APPEARANCE:  Normal/appropriate GEN:  Alert and oriented MOOD:  Depressed       AFFECT:  Flat   SPEECH:  Slow, slurred at times     THOUGHT CONTENT:  Appropriate HALLUCINATIONS:  None INTELLIGENCE:  Average  INSIGHT:  Fair JUDGMENT:  Fair SUICIDAL IDEATION:  Denies SI   HOMICIDAL IDEATION:   Denies HI   IMPRESSION: Overall, [name] continues to endorse cognitive issues post-stroke; in fact, he expressed concern for increased difficulty with mathematical calculations and manipulating numbers in general. However, his wife thinks that his cognitive difficulties have improved in general. I again discussed the value in undergoing comprehensive neuropsychological evaluation upon discharge to assess for immediate issues and then again in the future to assess for interval change. With regard to daily life, I think that [name]'s biggest hurdle will be trying to maintain sobriety. For this I strongly recommend (at minimum) participation in an intensive outpatient substance abuse program with inpatient treatment if that is not successful. I already spoke with his Child psychotherapistsocial worker about this and asked her to provide him with resources for this service in his area as well as for individual counseling.   DIAGNOSES:  CVA Adjustment disorder with mixed depression and anxiety Alcohol Dependence   Debbe MountsAdam T. Taisei Bonnette, Psy.D., ABN Board-Certified Clinical Neuropsychologist

## 2016-02-03 ENCOUNTER — Inpatient Hospital Stay (HOSPITAL_COMMUNITY): Payer: PRIVATE HEALTH INSURANCE | Admitting: Occupational Therapy

## 2016-02-03 ENCOUNTER — Inpatient Hospital Stay (HOSPITAL_COMMUNITY): Payer: PRIVATE HEALTH INSURANCE | Admitting: Speech Pathology

## 2016-02-03 ENCOUNTER — Inpatient Hospital Stay (HOSPITAL_COMMUNITY): Payer: PRIVATE HEALTH INSURANCE | Admitting: Physical Therapy

## 2016-02-03 NOTE — Progress Notes (Signed)
Social Work Patient ID: Jon [name], male   DOB: 02/18/1956, 60 y.o.   MRN: [id number] Met with pt and wife to discuss teams conference progression toward goals and plan for discharge tomorrow. Wife reports the equipment she was getting form someone has fallen through. She will now need the wheelchair, bedside commode and tub bench. Have made referral to [facility] for equipment and using [facility] for the home health follow up. Wife reports ramp built today. She is finishing the education later this afternoon. Will plan for discharge tomorrow.

## 2016-02-03 NOTE — Progress Notes (Signed)
Physical Therapy Discharge Summary  Patient Details  Name: Jon [name] MRN: [id number] Date of Birth: [birth year]  Today's Date: 02/03/2016 PT Individual Time: 1300-1415 PT Individual Time Calculation (min): 75 min    Patient has met 10 of 11 long term goals due to improved activity tolerance, improved balance, improved postural control, increased strength, increased range of motion, decreased pain, ability to compensate for deficits, functional use of  left upper extremity and left lower extremity, improved attention, improved awareness and improved coordination.  Patient to discharge at a wheelchair level Garrett.   Patient's care partner is independent to provide the necessary physical and cognitive assistance at discharge.  Reasons goals not met: Gait goal unmet; pt currently requires maxA for management of HW and progression/stance of LLE. Recommended pt not ambulate at home with wife until more practice with follow up PT; pt and wife agreeable.  Recommendation:  Patient will benefit from ongoing skilled PT services in home health setting to continue to advance safe functional mobility, address ongoing impairments in strength, LLE motor control/strength/coordination, sitting and standing balance, and minimize fall risk.  Equipment: w/c  Reasons for discharge: treatment goals met and discharge from hospital  Patient/family agrees with progress made and goals achieved: Yes  PT Discharge Precautions/Restrictions Precautions Precautions: Fall Precaution Comments: L hemiparesis/flaccid with subluxation, right head turn with Lt inattention Pain Pain Assessment Pain Assessment: 0-10 Pain Score: 0-No pain Pain Type: Acute pain Pain Location: Hip Pain Orientation: Left Pain Descriptors / Indicators: Aching;Sore Patients Stated Pain Goal: 2 Pain Intervention(s): Medication (See eMAR) Vision/Perception  Vision - Assessment Additional Comments: Pt demonstrates Rt head turn and  gaze preference with Lt inattention.  Pt demonstrating increased ability to scan to Lt to locate items in Lt visual field.  Cognition Overall Cognitive Status: Impaired/Different from baseline Arousal/Alertness: Awake/alert Orientation Level: Oriented X4 Attention: Sustained Sustained Attention: Impaired Sustained Attention Impairment: Functional basic;Verbal basic Memory: Impaired Memory Impairment: Decreased recall of new information Awareness: Impaired Awareness Impairment: Emergent impairment Problem Solving: Impaired Problem Solving Impairment: Functional basic Executive Function: Initiating;Self Monitoring;Self Correcting Initiating: Impaired Initiating Impairment: Functional basic Self Monitoring: Impaired Self Monitoring Impairment: Functional basic;Verbal basic Self Correcting: Impaired Self Correcting Impairment: Functional basic;Verbal basic Behaviors: Impulsive;Perseveration Safety/Judgment: Impaired Comments: left inattention  Sensation Sensation Light Touch: Impaired Detail Light Touch Impaired Details: Absent LUE Stereognosis: Impaired Detail Stereognosis Impaired Details: Absent LUE Hot/Cold: Not tested Proprioception: Impaired Detail Proprioception Impaired Details: Absent LUE Coordination Gross Motor Movements are Fluid and Coordinated: No Fine Motor Movements are Fluid and Coordinated: No Finger Nose Finger Test: Unable to complete on Lt due to flaccid UE 9 Hole Peg Test: Unable to complete on Lt due to flaccid UE Motor  Motor Motor: Hemiplegia Motor - Discharge Observations: L hemiparesis, apraxia for LLE activation  Mobility Bed Mobility Bed Mobility: Supine to Sit;Sit to Supine Supine to Sit: 5: Supervision Supine to Sit Details: Verbal cues for technique Supine to Sit Details (indicate cue type and reason): cues for hooking LLE with RLE Sit to Supine: 5: Supervision Sit to Supine - Details: Verbal cues for technique Sit to Supine - Details  (indicate cue type and reason): cues for hooking LLE with RLE Transfers Transfers: Yes Squat Pivot Transfers: 4: Min assist;With armrests Squat Pivot Transfer Details: Verbal cues for technique;Verbal cues for sequencing;Manual facilitation for weight shifting Locomotion  Ambulation Ambulation: Yes Ambulation/Gait Assistance: 2: Max assist Ambulation Distance (Feet): 50 Feet Assistive device: Hemi-walker Ambulation/Gait Assistance Details: Verbal cues for technique;Verbal cues for gait  pattern;Verbal cues for precautions/safety;Verbal cues for safe use of DME/AE;Manual facilitation for placement;Tactile cues for posture;Tactile cues for sequencing;Tactile cues for weight shifting Ambulation/Gait Assistance Details: Assist for LLE progression, cues for upright posture and forward gaze, assist to weight shift to R side for LLE progression Gait Gait: Yes Gait Pattern: Impaired Gait Pattern: Decreased weight shift to right;Lateral hip instability;Poor foot clearance - left;Narrow base of support;Left flexed knee in stance;Decreased stance time - left;Step-to pattern Gait velocity: significantly decreased for age/gender norms Stairs / Additional Locomotion Stairs: Yes (not performed on grad day d/t refusal) Stairs Assistance: 2: Max assist Stairs Assistance Details: Verbal cues for sequencing;Verbal cues for technique;Verbal cues for precautions/safety;Tactile cues for weight shifting;Tactile cues for posture;Tactile cues for weight beaing Stairs Assistance Details (indicate cue type and reason): cues for sequencing step-to pattern with lead RLE, assist to progress and stance control LLE Stair Management Technique: One rail Right;Forwards;Step to pattern Number of Stairs: 4 Height of Stairs: 6 Wheelchair Mobility Wheelchair Mobility: Yes Wheelchair Assistance: 5: Investment banker, operational Details: Verbal cues for technique;Verbal cues for precautions/safety (cues for L  attention) Wheelchair Propulsion: Right upper extremity;Right lower extremity Wheelchair Parts Management: Needs assistance Distance: 150'  Trunk/Postural Assessment  Cervical Assessment Cervical Assessment: Within Functional Limits Thoracic Assessment Thoracic Assessment: Within Functional Limits Lumbar Assessment Lumbar Assessment: Within Functional Limits Postural Control Postural Control: Deficits on evaluation (impaired righting reactions with L lateral LOB in standing d/t poor activation LLE)  Balance Balance Balance Assessed: Yes Static Sitting Balance Static Sitting - Balance Support: Feet supported;No upper extremity supported Static Sitting - Level of Assistance: 6: Modified independent (Device/Increase time) Dynamic Sitting Balance Dynamic Sitting - Balance Support: Feet supported;No upper extremity supported Dynamic Sitting - Level of Assistance: 6: Modified independent (Device/Increase time) Static Standing Balance Static Standing - Balance Support: Right upper extremity supported Static Standing - Level of Assistance: 4: Min assist Dynamic Standing Balance Dynamic Standing - Balance Support: Right upper extremity supported;During functional activity Dynamic Standing - Level of Assistance: 4: Min assist Dynamic Standing - Balance Activities: Reaching for objects;Forward lean/weight shifting Extremity Assessment  RUE Assessment RUE Assessment: Within Functional Limits LUE Assessment LUE Assessment: Exceptions to [facility] (flaccid LUE, shoulder subluxation with mild-moderate pain with passive shoulder flexion greater than 90 degrees) RLE Assessment RLE Assessment: Within Functional Limits LLE Assessment LLE Assessment: Exceptions to [facility] (No palpable AROM; quad grossly 4-/5 during stance in gait)  Skilled Therapeutic Intervention: Pt received seated in recliner with wife present; denies pain and agreeable to treatment. Assessed gait x50' with maxA for LLE progression and  stance control, and HW management during turns. Gait performed with orthotist present for AFO assessment, with determination to continue use of GRAFO to A with knee control in stance. Car transfer performed with pt's wife performing minA into their vehicle to prepare for d/c; pt and wife able to correctly setup w/c with safety considerations and perform transfer without cueing from therapist. Assessed remaining mobility as described above with S/minA overall. Pt reports "My head is starting to feel less foggy" over last several days, and therapist noted improved attention to tasks, correct recall of several safety precautions and technique for transfers and mobility. Pt and wife with no further questions at this time, feel prepared for d/c now that ramp is being built. Pt remained seated in w/c at completion of session, wife present and all needs in reach.    See Function Navigator for Current Functional Status.  Benjiman Core Tygielski 02/03/2016, 2:24 PM

## 2016-02-03 NOTE — Progress Notes (Signed)
Occupational Therapy Session Note  Patient Details  Name: Jon [name] MRN: [id number] Date of Birth: 1956/06/28  Today's Date: 02/03/2016 OT Individual Time: [id number] and 1132-1202 OT Individual Time Calculation (min): 69 min and 30 min   Short Term Goals: Week 3:  OT Short Term Goal 1 (Week 3): STG = LTGs due to remaining LOS  Skilled Therapeutic Interventions/Progress Updates:    1) Treatment session with focus on functional transfers, Lt attention, and increased participation in self-care tasks.  Completed bed mobility and all transfers at min assist this session.  Pt demonstrates increased participation and initiation with decreased distractions.  Completed bathing in room shower with supervision during seated tasks and min assist for standing balance when washing buttocks.  Min cues for sequencing during bathing and physical assist to wash Rt arm.  Pt demonstrated increased recall of hemi-dressing technique and completed all steps of UB dressing, however required steady assist and support at LUE when pulling shirt over head.  Pt required increased cues to attend to task when nursing arrived, as pt easily distracted both internally and externally.  LB dressing completed at sit > stand level with pt able to pull pants over hips this session with increased time and cues to reach around backside with Rt arm.    2) Treatment session with focus on hands on education with pt and pt's wife.  Upon arrival pt reports need to toilet.  Pt's wife assisted with transfer and standing balance during toilet transfer and toileting.  Pt able to recall sequence of transfers and able to assist wife with recalling particular steps and sequence of transfer.  Both pt and pt's wife process aloud, which is beneficial to ensure both on same step during transfer.  Educated pt and pt's wife on squat pivot transfers bed <> BSC as this will be primary technique at night time.  Pt and pt's wife return demonstrated transfers  and report understanding.  Therapy Documentation Precautions:  Precautions Precautions: Fall Precaution Comments: L hemiparesis/flaccid, right head turn Restrictions Weight Bearing Restrictions: No Pain: Pain Assessment Pain Score: Pt with c/o pain, unrated Pain Type: Acute pain Pain Location: Hip and shoulder Pain Orientation: Left Pain Descriptors / Indicators: Aching;Discomfort Patients Stated Pain Goal: 2 Pain Intervention(s): Premedicated  See Function Navigator for Current Functional Status.   Therapy/Group: Individual Therapy  [name], [name] 02/03/2016, 10:25 AM

## 2016-02-03 NOTE — Discharge Instructions (Signed)
Inpatient Rehab Discharge Instructions  Jon [name] Discharge date and time: No discharge date for patient encounter.   Activities/Precautions/ Functional Status: Activity: activity as tolerated Diet: regular diet Wound Care: none needed Functional status:  ___ No restrictions     ___ Walk up steps independently ___ 24/7 supervision/assistance   ___ Walk up steps with assistance ___ Intermittent supervision/assistance  ___ Bathe/dress independently ___ Walk with walker     _x__ Bathe/dress with assistance ___ Walk Independently    ___ Shower independently ___ Walk with assistance    ___ Shower with assistance ___ No alcohol     ___ Return to work/school ________  Special Instructions:  No driving or alcohol use  COMMUNITY REFERRALS UPON DISCHARGE:    Home Health:   PT, OT ,SP, RN    AGENCY:[facility] [phone]  WORKING ON HOME HEALTH AGENCY   Medical Equipment/Items Ordered:WHEELCHAIR, TUB BENCH & BEDSIDE [facility]   [phone]   Other:[facility] [phone] SHE WILL CALL YOU 7/18 TO DISCUSS GETTING INTO THEIR PROGRAM  GENERAL COMMUNITY RESOURCES FOR PATIENT/FAMILY: Support Groups:CVA SUPPORT GROUP  EVERY SECOND Thursday @ 3:00-4:00 PM ON THE REHAB UNIT QUESTIONS CONTACT KATIE  [phone]  AA INFORMATION GIVEN STROKE/TIA DISCHARGE INSTRUCTIONS SMOKING Cigarette smoking nearly doubles your risk of having a stroke & is the single most alterable risk factor  If you smoke or have smoked in the last 12 months, you are advised to quit smoking for your health.  Most of the excess cardiovascular risk related to smoking disappears within a year of stopping.  Ask you doctor about anti-smoking medications  [place] Quit Line: 1-800-QUIT NOW  Free Smoking Cessation Classes [phone]  CHOLESTEROL Know your levels; limit fat & cholesterol in your diet  Lipid Panel     Component Value Date/Time   CHOL 250*  01/09/2016 0807   TRIG 212* 01/09/2016 0807   HDL 51 01/09/2016 0807   CHOLHDL 4.9 01/09/2016 0807   VLDL 42* 01/09/2016 0807   LDLCALC 157* 01/09/2016 0807      Many patients benefit from treatment even if their cholesterol is at goal.  Goal: Total Cholesterol (CHOL) less than 160  Goal:  Triglycerides (TRIG) less than 150  Goal:  HDL greater than 40  Goal:  LDL (LDLCALC) less than 100   BLOOD PRESSURE American Stroke Association blood pressure target is less that 120/80 mm/Hg  Your discharge blood pressure is:  BP: 140/70 mmHg  Monitor your blood pressure  Limit your salt and alcohol intake  Many individuals will require more than one medication for high blood pressure  DIABETES (A1c is a blood sugar average for last 3 months) Goal HGBA1c is under 7% (HBGA1c is blood sugar average for last 3 months)  Diabetes: No known diagnosis of diabetes    Lab Results  Component Value Date   HGBA1C 5.5 01/09/2016     Your HGBA1c can be lowered with medications, healthy diet, and exercise.  Check your blood sugar as directed by your physician  Call your physician if you experience unexplained or low blood sugars.  PHYSICAL ACTIVITY/REHABILITATION Goal is 30 minutes at least 4 days per week  Activity: Increase activity slowly, Therapies: Physical Therapy: Home Health Return to work:   Activity decreases your risk of heart attack and stroke and makes your heart stronger.  It helps control your weight and blood pressure; helps you relax and can improve your mood.  Participate in a regular exercise program.  Talk with your doctor about the best form of exercise for you (dancing, walking, swimming, cycling).  DIET/WEIGHT Goal is to maintain a healthy weight  Your discharge diet is: DIET DYS 3 Room service appropriate?: Yes with Assist; Fluid consistency:: Thin Diet regular Room service appropriate?: Yes; Fluid consistency:: Thin  liquids Your height is:    Your current weight is:  Weight: 90.7 kg (199 lb 15.3 oz) Your Body Mass Index (BMI) is:     Following the type of diet specifically designed for you will help prevent another stroke.  Your goal weight range is:    Your goal Body Mass Index (BMI) is 19-24.  Healthy food habits can help reduce 3 risk factors for stroke:  High cholesterol, hypertension, and excess weight.  RESOURCES Stroke/Support Group:  Call [phone]   STROKE EDUCATION PROVIDED/REVIEWED AND GIVEN TO PATIENT Stroke warning signs and symptoms How to activate emergency medical system (call 911). Medications prescribed at discharge. Need for follow-up after discharge. Personal risk factors for stroke. Pneumonia vaccine given:  Flu vaccine given:  My questions have been answered, the writing is legible, and I understand these instructions.  I will adhere to these goals & educational materials that have been provided to me after my discharge from the hospital.      My questions have been answered and I understand these instructions. I will adhere to these goals and the provided educational materials after my discharge from the hospital.  Patient/Caregiver Signature _______________________________ Date __________  Clinician Signature _______________________________________ Date __________  Please bring this form and your medication list with you to all your follow-up doctor's appointments.

## 2016-02-03 NOTE — Progress Notes (Signed)
Speech Language Pathology Discharge Summary  Patient Details  Name: Jon [name] MRN: [id number] Date of Birth: Jun 29, 1956  Today's Date: 02/03/2016 SLP Individual Time: 1418-1510 SLP Individual Time Calculation (min): 52 min   Skilled Therapeutic Interventions:  Pt was seen for skilled ST for grad day activities and completion of family education prior to discharge home tomorrow.  Therapist administered the MoCA to measure progress from initial evaluation.  Pt scored 23 out of 30 on assessment (n>/=26) which is indicative of ongoing moderate cognitive impairment with deficits most notable for attention, executive function, and delayed recall.  Pt's wife present throughout therapy session and therapist provided skilled education regarding distraction management techniques and compensatory memory strategies to maximize pt's functional independence during basic self care tasks in the home environment.  SLP recommended that pt have 24/7 supervision and assistance for medication and financial management at discharge in addition to [place] follow up at next level of care.  All questions were answered to their satisfaction at this time.  Handouts were provided to maximize carryover in the home environment.  Education is complete at this time and pt is ready for discharge tomorrow.      Patient has met 5 of 6 long term goals.  Patient to discharge at overall Supervision;Min level.  Reasons goals not met: pt able to sustain his attention to tasks for ~5 minute intervals    Clinical Impression/Discharge Summary:  Pt has made functional gains while inpatient and is discharging having met 5 out of 6 long term goals.  Pt's diet has been upgraded to regular textures with thin liquids which he is consuming with supervision use of swallowing precautions to clear oral residue.  Pt requires overall min assist for basic cognitive tasks due to moderate impairment s/p R CVA characterized by left inattention, poor emergent  awareness of deficits, and decreased sustained attention to tasks.  Pt and family education is complete at this time.  Pt is discharging home with 24/7 supervision from family.  Recommend ongoing ST at next level of care to continue to address cognitive function and swallowing safety.    Care Partner:  Caregiver Able to Provide Assistance: Yes  Type of Caregiver Assistance: Physical;Cognitive  Recommendation:  24 hour supervision/assistance;Home Health SLP;Outpatient SLP  Rationale for SLP Follow Up: Maximize cognitive function and independence;Reduce caregiver burden;Maximize swallowing safety   Equipment: none recommended by SLP    Reasons for discharge: Discharged from hospital   Patient/Family Agrees with Progress Made and Goals Achieved: Yes   Function:  Eating Eating                 Cognition Comprehension Comprehension assist level: Follows basic conversation/direction with extra time/assistive device  Expression   Expression assist level: Expresses basic needs/ideas: With no assist  Social Interaction Social Interaction assist level: Interacts appropriately 90% of the time - Needs monitoring or encouragement for participation or interaction.  Problem Solving Problem solving assist level: Solves basic 75 - 89% of the time/requires cueing 10 - 24% of the time  Memory Memory assist level: Recognizes or recalls 75 - 89% of the time/requires cueing 10 - 24% of the time   Emilio Math 02/03/2016, 4:06 PM

## 2016-02-03 NOTE — Progress Notes (Signed)
Subjective/Complaints: No new problems overnight. Wife and patient met with neuropsychologist and discussed his problems with alcohol abuse.    Review of systems: denies chest pain shortness of breath nausea vomiting diarrhea and constipation  Objective: Vital Signs: Blood pressure 137/68, pulse 73, temperature 98.1 F (36.7 C), temperature source Oral, resp. rate 18, weight 90.7 kg (199 lb 15.3 oz), SpO2 99 %. No results found. No results found for this or any previous visit (from the past 72 hour(s)).  HEENT: Normocephalic, atraumatic.  Cardio: RRR Resp: CTA B/L and unlabored GI: BS positive and mild epigastric distention and tenderness Skin:   Warm and dry. Appears pale this morning Neuro:Cranial Nerve Abnormalities left central 7 Right facial weakness  Sensory: Diminished sensation left upper and left lower limb Motor: LUE/LLE; 0/5  RUE/RLE: 5/5 right upper extremity  Tone:  left elbow flexor 1/4, left finger flexors 1/4 as well left knee flexors Ashworth grade 2/4  Musc/Skel: No edema. No tenderness.  Gen. no acute distress. Vital signs reviewed.  Psych: Mood and affect normal, distracted  Assessment/Plan: 1. Functional deficits secondary to right hemiparesis causing left hemiplegia which require 3+ hours per day of interdisciplinary therapy in a comprehensive inpatient rehab setting. Physiatrist is providing close team supervision and 24 hour management of active medical problems listed below. Physiatrist and rehab team continue to assess barriers to discharge/monitor patient progress toward functional and medical goals. FIM: Function - Bathing Position: Shower Body parts bathed by patient: Left arm, Chest, Abdomen, Front perineal area, Right upper leg, Left upper leg Body parts bathed by helper: Right arm, Buttocks, Back Bathing not applicable: Left lower leg, Right lower leg Assist Level: Touching or steadying assistance(Pt > 75%) (Mod assist)  Function- Upper Body  Dressing/Undressing Upper body dressing/undressing activity did not occur: Refused What is the patient wearing?: Pull over shirt/dress Pull over shirt/dress - Perfomed by patient: Thread/unthread right sleeve, Put head through opening, Pull shirt over trunk Pull over shirt/dress - Perfomed by helper: Thread/unthread left sleeve Assist Level: Touching or steadying assistance(Pt > 75%) Function - Lower Body Dressing/Undressing What is the patient wearing?: Pants, Socks, Shoes, AFO Position: Sitting EOB Pants- Performed by patient: Pull pants up/down, Thread/unthread right pants leg Pants- Performed by helper: Thread/unthread left pants leg Non-skid slipper socks- Performed by helper: Don/doff right sock, Don/doff left sock Socks - Performed by helper: Don/doff right sock, Don/doff left sock Shoes - Performed by patient: Don/doff right shoe Shoes - Performed by helper: Don/doff left shoe AFO - Performed by helper: Don/doff left AFO Assist for footwear: Maximal assist Assist for lower body dressing: Touching or steadying assistance (Pt > 75%) (Max assist)  Function - Toileting Toileting activity did not occur: No continent bowel/bladder event Toileting steps completed by patient: Adjust clothing prior to toileting, Performs perineal hygiene Toileting steps completed by helper: Adjust clothing after toileting Toileting Assistive Devices: Grab bar or rail Assist level: Touching or steadying assistance (Pt.75%)  Function - Air cabin crew transfer activity did not occur: Safety/medical concerns Toilet transfer assistive device: Grab bar Mechanical lift: Stedy Assist level to toilet: Moderate assist (Pt 50 - 74%/lift or lower) Assist level from toilet: Touching or steadying assistance (Pt > 75%)  Function - Chair/bed transfer Chair/bed transfer method: Squat pivot Chair/bed transfer assist level: Moderate assist (Pt 50 - 74%/lift or lower) (Min assist to Rt, Mod assist to  Lt) Chair/bed transfer assistive device: Armrests Chair/bed transfer details: Verbal cues for technique, Verbal cues for precautions/safety, Manual facilitation for weight shifting,  Manual facilitation for placement  Function - Locomotion: Wheelchair Will patient use wheelchair at discharge?: Yes Type: Manual Max wheelchair distance: 159f  Assist Level: Supervision or verbal cues Assist Level: Supervision or verbal cues Wheel 150 feet activity did not occur: Safety/medical concerns Turns around,maneuvers to table,bed, and toilet,negotiates 3% grade,maneuvers on rugs and over doorsills: No Function - Locomotion: Ambulation Ambulation activity did not occur: Safety/medical concerns Assistive device: Walker-hemi Max distance: 217fAssist level: Moderate assist (Pt 50 - 74%) Assist level: Moderate assist (Pt 50 - 74%) Walk 50 feet with 2 turns activity did not occur: Safety/medical concerns Walk 150 feet activity did not occur: Safety/medical concerns Walk 10 feet on uneven surfaces activity did not occur: Safety/medical concerns  Function - Comprehension Comprehension: Auditory Comprehension assist level: Understands basic 90% of the time/cues < 10% of the time  Function - Expression Expression: Verbal Expression assist level: Expresses basic 90% of the time/requires cueing < 10% of the time.  Function - Social Interaction Social Interaction assist level: Interacts appropriately 75 - 89% of the time - Needs redirection for appropriate language or to initiate interaction.  Function - Problem Solving Problem solving assist level: Solves basic 50 - 74% of the time/requires cueing 25 - 49% of the time  Function - Memory Memory assist level: Recognizes or recalls 75 - 89% of the time/requires cueing 10 - 24% of the time Patient normally able to recall (first 3 days only): Current season, That he or she is in a hospital  Medical Problem List and Plan: 1.Left hemiplegia secondary to  right basal ganglia hemorrhage secondary to hypertensive crisis     CIR Team conference today please see physician documentation under team conference tab, met with team face-to-face to discuss problems,progress, and goals. Formulized individual treatment plan based on medical history, underlying problem and comorbidities.. Marland KitchenRAFO, splints  2.  DVT Prophylaxis/Anticoagulation: Subcutaneous heparin initiated 01/10/2016. Monitor for any bleeding episodes 3. Pain Management/headaches: Tylenol 3 as needed, Post stroke headaches started Topamax 25 twice a day, topical analgesic cream, left shoulder pain multifactorial, evidence of a capsulitis as well as anterio inferior subluxation and impingement syndrome. Patient also has  Hypersensitivity on the entire left side related to his CVA, start gabapentin 4. Dysphagia. Dysphagia 3 thin liquid diet being tolerated. 5. Neuropsych: This patient is not fully capable of making decisions on his own behalf. 6. Skin/Wound Care: Routine skin checks, Completed Keflex for left antecubital fossa cellulitis, superficial 7. Fluids/Electrolytes/Nutrition: Routine I&O's   changed to lactose-free diet- has helped with abdominal discomfort  poor intake, started Megace, this may also be cognitive based--eating 90-100% of meals ,  8. Hypertension. Norvasc 10 mg daily, Lopressor 75 mg twice a day.   Monitor with increased mobility  Controlled for the most part, monitor for hypotension with increase of tizanidine 9. History of alcohol use. Discussed need for continued cessation after discharge to home 10. Hyperlipidemia. Pravachol 11. Mood. Zoloft 50 mg daily. Provide emotional support 12. Urinary tract infection completed 7 days of Keflex  13. Cognitive deficits post stroke continue speech therapy still has poor awareness of deficits 14. Spasticity  Tizanidine, increased to 34m20mID on 7/7  Baclofen 5mg634mD started on 7/4, increased to 20U on 7/5  Cont ROM   LOS (Days)  20 A FACE TO FACE EVALUATION WAS PERFORMED  KIRSTEINS,ANDREW E 02/03/2016, 9:53 AM

## 2016-02-03 NOTE — Progress Notes (Signed)
Occupational Therapy Discharge Summary  Patient Details  Name: Jon [name] MRN: [id number] Date of Birth: 1956-01-14  Patient has met 12 of 13 long term goals due to improved activity tolerance, improved balance, postural control, ability to compensate for deficits, improved attention and improved awareness.  Patient to discharge at [facility] Assist level.  Patient's care partner is independent to provide the necessary physical and cognitive assistance at discharge.  Patient's wife has been present to observe therapy sessions and has completed hands on family education to prepare for d/c home.  Reasons goals not met: Pt continues to require mod assist with bathing due to decreased balance, Lt hemiplegia, and Lt inattention.  Recommendation:  Patient will benefit from ongoing skilled OT services in home health setting to continue to advance functional skills in the area of BADL and Reduce care partner burden.  Equipment: 3 in 1 commode, tub transfer bench  Reasons for discharge: treatment goals met and discharge from hospital  Patient/family agrees with progress made and goals achieved: Yes  OT Discharge Precautions/Restrictions  Precautions Precautions: Fall Precaution Comments: L hemiparesis/flaccid with subluxation, right head turn with Lt inattention Pain Pain Assessment Pain Assessment: 0-10 Pain Score: 0-No pain Pain Type: Acute pain Pain Location: Hip Pain Orientation: Left Pain Descriptors / Indicators: Aching;Sore Patients Stated Pain Goal: 2 Pain Intervention(s): Medication (See eMAR) ADL  See Function Navigator Vision/Perception  Vision- History Baseline Vision/History: Wears glasses Wears Glasses: Reading only Patient Visual Report: Blurring of vision Vision- Assessment Vision Assessment?: Vision impaired- to be further tested in functional context Additional Comments: Pt demonstrates Rt head turn and gaze preference with Lt inattention.  Pt demonstrating  increased ability to scan to Lt to locate items in Lt visual field.  Cognition Overall Cognitive Status: Impaired/Different from baseline Arousal/Alertness: Awake/alert Orientation Level: Oriented X4 Attention: Sustained Sustained Attention: Impaired Sustained Attention Impairment: Functional basic;Verbal basic Memory: Impaired Memory Impairment: Decreased recall of new information Awareness: Impaired Awareness Impairment: Emergent impairment Problem Solving: Impaired Problem Solving Impairment: Functional basic Executive Function: Initiating;Self Monitoring;Self Correcting Initiating: Impaired Initiating Impairment: Functional basic Self Monitoring: Impaired Self Monitoring Impairment: Functional basic;Verbal basic Self Correcting: Impaired Self Correcting Impairment: Functional basic;Verbal basic Behaviors: Impulsive;Perseveration Safety/Judgment: Impaired Comments: left inattention  Sensation Sensation Light Touch: Impaired Detail Light Touch Impaired Details: Absent LUE Stereognosis: Impaired Detail Stereognosis Impaired Details: Absent LUE Hot/Cold: Not tested Proprioception: Impaired Detail Proprioception Impaired Details: Absent LUE Coordination Gross Motor Movements are Fluid and Coordinated: No Fine Motor Movements are Fluid and Coordinated: No Finger Nose Finger Test: Unable to complete on Lt due to flaccid UE 9 Hole Peg Test: Unable to complete on Lt due to flaccid UE Extremity/Trunk Assessment RUE Assessment RUE Assessment: Within Functional Limits LUE Assessment LUE Assessment: Exceptions to [facility] (flaccid LUE, shoulder subluxation with mild-moderate pain with passive shoulder flexion greater than 90 degrees)   See Function Navigator for Current Functional Status.  Simonne Come 02/03/2016, 1:54 PM

## 2016-02-03 NOTE — Plan of Care (Signed)
Problem: RH Attention Goal: LTG Patient will demonstrate focused/sustained (SLP) LTG: Patient will demonstrate focused/sustained/selective/alternating/divided attention during cognitive/linguistic activities in specific environment with assist for # of minutes (SLP)  Outcome: Not Met (add Reason) Pt able to sustain his attention to tasks for ~5 minutes

## 2016-02-03 NOTE — Discharge Summary (Signed)
Discharge summary job [id number]

## 2016-02-03 NOTE — Patient Care Conference (Signed)
Inpatient RehabilitationTeam Conference and Plan of Care Update Date: 02/03/2016   Time: 11:00 Am    Patient Name: Jon [name]      Medical Record Number: [id number]  Date of Birth: 1956-06-19 Sex: Male         Room/Bed: 4W06C/4W06C-01 Payor Info: Payor: [employer] / Plan: [employer] / Product Type: *No Product type* /    Admitting Diagnosis: CVA  Admit Date/Time:  01/14/2016  3:41 PM Admission Comments: No comment available   Primary Diagnosis:  Hemorrhagic stroke ([place]) Principal Problem: Hemorrhagic stroke [facility])  Patient Active Problem List   Diagnosis Date Noted  . Left flaccid hemiparesis ([place])   . Muscle spasticity   . Poor appetite   . Hyperlipidemia 01/14/2016  . Depression 01/14/2016  . Obesity 01/14/2016  . Hyperglycemia 01/14/2016  . Urinary tract infection, site not specified 01/14/2016  . Basal ganglia hemorrhage ([place]) 01/14/2016  . Hemiparesis affecting nondominant side as late effect of cerebrovascular accident ([place])   . Cognitive deficit, post-stroke   . Left hemiparesis ([place])   . Essential hypertension   . Dysphagia, post-stroke   . Gait disturbance, post-stroke   . Hyponatremia   . Thrombocytopenia ([place])   . Hemorrhagic stroke ([place]) R basal ganglia d/t HTN 01/08/2016    Expected Discharge Date: Expected Discharge Date: 02/04/16  Team Members Present: Physician leading conference: Dr. Alysia Penna Social Worker Present: Ovidio Kin, LCSW Nurse Present: Junius Creamer, RN PT Present: Kem Parkinson, PT OT Present: Simonne Come, OT SLP Present: Windell Moulding, SLP PPS Coordinator present : Daiva Nakayama, RN, CRRN     Current Status/Progress Goal Weekly Team Focus  Medical   Left hemiparesis little return  home with wife  D/C planning   Bowel/Bladder   continent of both, condom cath @ night per preference  continent x2 with mod I      Swallow/Nutrition/ Hydration   upgraded to regular textures and thin liquids   supervision   goals met    ADL's    min-mod assist transfers, max assist LB dressing, severe Lt neglect, LUE flaccid with absent sensation and sublux/pain  min A balance, bathroom transfers,UB dressing; mod A LB dressing, toileting  ADL retraining, pt/family education, LUE NMR, trunk control, visual scanning   Mobility   minA bed mobility, min/mod squat pivot transfers, modA gait controlled environment, S w/c propulsion  S bed mobility, minA transfers, modA gait controlled environment, S w/c propulsion  Family education and hands-on training   Communication   supervision   supervision   goal met    Safety/Cognition/ Behavioral Observations  min-mod assist   min assist  completion of family education prior to discharge home    Pain   tylenol#3 prn, usually once during day, does not c/o pain @ night  pain<3  assess & treat prn   Skin   no issues  free of breakdown  assess q shift      *See Care Plan and progress notes for long and short-term goals.  Barriers to Discharge: none    Possible Resolutions to Barriers:  d/c in am    Discharge Planning/Teaching Needs:  Wife educated regarding husband's care. Ramp being built today. Preparing for discharge tomorrow.      Team Discussion:  Finishing family education and ramp being built today. Upgraded diet to regular thin. Met his goals. AFO eval today. Medically stable for discharge tomorrow.   Revisions to Treatment Plan:  DC tomorrow   Continued Need for Acute Rehabilitation Level  of Care: The patient requires daily medical management by a physician with specialized training in physical medicine and rehabilitation for the following conditions: Daily direction of a multidisciplinary physical rehabilitation program to ensure safe treatment while eliciting the highest outcome that is of practical value to the patient.: Yes Daily medical management of patient stability for increased activity during participation in an intensive rehabilitation regime.: Yes  Nadra Hritz, Gardiner Rhyme 02/03/2016, 12:39 PM

## 2016-02-04 LAB — CBC WITH DIFFERENTIAL/PLATELET
BASOS PCT: 1 %
Basophils Absolute: 0 10*3/uL (ref 0.0–0.1)
EOS ABS: 0.3 10*3/uL (ref 0.0–0.7)
Eosinophils Relative: 3 %
HCT: 45.6 % (ref 39.0–52.0)
HEMOGLOBIN: 15.4 g/dL (ref 13.0–17.0)
Lymphocytes Relative: 20 %
Lymphs Abs: 1.7 10*3/uL (ref 0.7–4.0)
MCH: 33.6 pg (ref 26.0–34.0)
MCHC: 33.8 g/dL (ref 30.0–36.0)
MCV: 99.6 fL (ref 78.0–100.0)
Monocytes Absolute: 0.8 10*3/uL (ref 0.1–1.0)
Monocytes Relative: 10 %
NEUTROS PCT: 66 %
Neutro Abs: 5.4 10*3/uL (ref 1.7–7.7)
PLATELETS: 184 10*3/uL (ref 150–400)
RBC: 4.58 MIL/uL (ref 4.22–5.81)
RDW: 11.9 % (ref 11.5–15.5)
WBC: 8.3 10*3/uL (ref 4.0–10.5)

## 2016-02-04 LAB — URINALYSIS, ROUTINE W REFLEX MICROSCOPIC
Glucose, UA: NEGATIVE mg/dL
KETONES UR: 15 mg/dL — AB
NITRITE: NEGATIVE
PROTEIN: 30 mg/dL — AB
Specific Gravity, Urine: 1.022 (ref 1.005–1.030)
pH: 5.5 (ref 5.0–8.0)

## 2016-02-04 LAB — URINE MICROSCOPIC-ADD ON

## 2016-02-04 MED ORDER — PRAVASTATIN SODIUM 40 MG PO TABS: 40.0000 mg | ORAL_TABLET | Freq: Every day | ORAL | Status: DC

## 2016-02-04 MED ORDER — TIZANIDINE HCL 4 MG PO TABS: 4.0000 mg | ORAL_TABLET | Freq: Three times a day (TID) | ORAL | Status: DC

## 2016-02-04 MED ORDER — DICLOFENAC SODIUM 1 % TD GEL: 2.0000 g | Freq: Four times a day (QID) | TRANSDERMAL | Status: DC

## 2016-02-04 MED ORDER — GABAPENTIN 300 MG PO CAPS: 300.0000 mg | ORAL_CAPSULE | Freq: Two times a day (BID) | ORAL | Status: DC

## 2016-02-04 MED ORDER — FOLIC ACID 1 MG PO TABS: 1.0000 mg | ORAL_TABLET | Freq: Every day | ORAL | Status: DC

## 2016-02-04 MED ORDER — CEPHALEXIN 250 MG PO CAPS: 250.0000 mg | ORAL_CAPSULE | Freq: Three times a day (TID) | ORAL | Status: DC

## 2016-02-04 MED ORDER — PANTOPRAZOLE SODIUM 40 MG PO TBEC: 40.0000 mg | DELAYED_RELEASE_TABLET | Freq: Every day | ORAL | Status: DC

## 2016-02-04 MED ORDER — METOPROLOL TARTRATE 75 MG PO TABS: 75.0000 mg | ORAL_TABLET | Freq: Two times a day (BID) | ORAL | Status: DC

## 2016-02-04 MED ORDER — ACETAMINOPHEN-CODEINE #3 300-30 MG PO TABS: 1.0000 | ORAL_TABLET | ORAL | Status: DC | PRN

## 2016-02-04 MED ORDER — AMLODIPINE BESYLATE 10 MG PO TABS: 10.0000 mg | ORAL_TABLET | Freq: Every day | ORAL | Status: DC

## 2016-02-04 MED ORDER — SERTRALINE HCL 50 MG PO TABS: 50.0000 mg | ORAL_TABLET | Freq: Every day | ORAL | Status: AC

## 2016-02-04 MED ORDER — ADULT MULTIVITAMIN W/MINERALS CH: 1.0000 | ORAL_TABLET | Freq: Every day | ORAL | Status: AC

## 2016-02-04 MED ORDER — BACLOFEN 10 MG PO TABS: 30.0000 mg | ORAL_TABLET | Freq: Three times a day (TID) | ORAL | Status: DC

## 2016-02-04 NOTE — Progress Notes (Signed)
Pt voiding dark red urine. Assessed pt's genital no s/o trama no bleeding from penis. Pt has no s/o distress or no c/o pain. Informed MD Dan A, PA, stat orders placed for UA and CBC w/ Differential. Urine collected and sent to lab. Will continue to monitor. Carleene CooperKrisitna Link Burgeson, RN

## 2016-02-04 NOTE — Discharge Summary (Signed)
NAMRhona Raider:  Rieth, Lauren                ACCOUNT NO.:  [id number]  MEDICAL RECORD NO.:  [id number]  LOCATION:  [id number]                        FACILITY:  [facility]  PHYSICIAN:  Erick ColaceAndrew E. Kirsteins, M.D.DATE OF BIRTH:  1956-05-09  DATE OF ADMISSION:  01/14/2016 DATE OF DISCHARGE:  02/04/2016                              DISCHARGE SUMMARY   DISCHARGE DIAGNOSES: 1. Right basal ganglia hemorrhage secondary to hypertensive crisis. 2. Subcutaneous heparin for deep venous thrombosis prophylaxis,     initiated January 10, 2016. 3. Pain management. 4. Dysphagia. 5. Hypertension. 6. History of alcohol abuse. 7. Hyperlipidemia. 8. Depression. 9. Urinary tract infection, resolved. 10.Spasticity. 11. Hematuria. Resolved  HISTORY OF PRESENT ILLNESS:  This is a 60 year old right-handed male with history of hypertension as well as alcohol use, on no prescription medications.  Married, working full-time.  Presented January 08, 2016, with acute onset of headache and left-sided weakness.  Initial blood pressures were reportedly elevated.  CT of the head showed right basal ganglia hemorrhage with 31 mL volume, 3 mm midline shift.  CT angiogram of the head and neck with no extracranial stenosis or dissection. Echocardiogram with ejection fraction of 65%, no wall motion abnormalities.  The patient did not receive tPA.  Subcutaneous heparin for DVT prophylaxis initiated January 10, 2016.  Dysphagia #1 nectar thick liquid diet.  Physical and occupational therapy ongoing.  The patient was admitted for a comprehensive rehab program.  PAST MEDICAL HISTORY:  See discharge diagnoses.  SOCIAL HISTORY:  Lives with wife, independent prior to admission. Working full time.  FUNCTIONAL STATUS:  Upon admission to Rehab Services was +2 physical assist sit to stand.  Max assist, general transfers.  Mod-to-max assist with activities of daily living.  PHYSICAL EXAMINATION:  VITAL SIGNS:  Blood pressure 133/62, pulse  70, temperature 97, respirations 20. GENERAL:  This was an alert male, well developed, speech dysarthric but intelligible, with apraxia.  He followed commands. HEENT:  Left facial droop. LUNGS:  Clear to auscultation. CARDIAC:  Regular rate and rhythm.  No murmur. ABDOMEN:  Soft, nontender.  Good bowel sounds.  REHABILITATION HOSPITAL COURSE:  The patient was admitted to [facility]patient [facility] with therapies initiated on a 3-hour daily basis, consisting of physical therapy, occupational therapy, speech therapy, and rehabilitation nursing.  The following issues were addressed during the patient's rehabilitation stay:  Pertaining to Mr. Teodoro Kilagle's right basal ganglia hemorrhage secondary to hypertensive crisis, remained stable, he would follow up with Neurology Services.  Blood pressures monitored, well controlled.  Subcutaneous heparin for DVT prophylaxis initiated January 10, 2016, no bleeding episodes.  He did have bouts of headache.  Taking Tylenol No. 3 as needed on a limited basis.  He had been on Topamax that has since been discontinued.  As noted, blood pressure is controlled on Lopressor as well as Norvasc.  He did have a long history of alcohol abuse.  The patient's family received full counseling in regards to cessation of alcohol products.  It was questionable he would be compliant with these requests.  He remained on Zoloft for history of depression.  Emotional support provided.  He was attending full therapies.  Noted spasticity while maintained on baclofen 30 mg  3 times daily as well as Zanaflex with overall good results and monitored closely. Hematuria 1. Urinalysis study unremarkable. Patient afebrile negative dysuria hematuria. Placed on Keflex 250 mg 3 times a day 1 week. Patient will follow-up outpatient urology as he has in the past. Hematuria possibly related to Lovenox discontinued The patient received weekly collaborative interdisciplinary team conferences to discuss  estimated length of stay, family teaching, any barriers to his discharge.  Transferred wheelchair to toilet with wife providing moderate assist.  Sit to stand for wife to don and doff pants, total assist.  Wheelchair propulsion with supervision and wife providing minimal verbal cues.  Stair ascent and descent with therapies, max assist.  Transfers wheelchair to bed with wife providing moderate squat pivot transfers.  Sit to supine, minimal assist.  Activities of daily living and homemaking.  Sit to stand, during self-care tasks, max cues for bed mobility and attention to task. Able to stand to pull down his pants with minimal assistance for basic hygiene, bathing completed in shower with moderate cues for attention to task.  Diet had been advanced to regular, followed by speech therapy. He was able to communicate his needs with noted dysarthria.  The patient is able to sustain his attention to task in a minimally distracting environment.  The patient also benefitted from supervision, verbal cues, for initiation and sequencing.  Full family teaching was completed and plan discharge to home.  DISCHARGE MEDICATIONS: 1. Tylenol No. 3, 1-2 tablets every 4 hours as needed for headache. 2. Norvasc 10 mg p.o. daily. 3. Baclofen 30 mg p.o. t.i.d. 4. Voltaren gel 4 times daily to affected area. 5. Folic acid 1 mg daily. 6. Neurontin 300 mg p.o. b.i.d. 7. Lopressor 75 mg p.o. b.i.d. 8. Multivitamin daily. 9. Protonix 40 mg p.o. daily. 10.Pravachol 40 mg p.o. daily. 11.Zoloft 50 mg p.o. daily. 12.Zanaflex 4 mg p.o. t.i.d. 13. Keflex 250 mg 3 times a day 1 week   DIET:  His diet was a regular consistency.  FOLLOWUP:  The patient would follow up with Dr. Nicholos Johns, medical management, [facility] Claud[facility]; Dr. Roda Shutters, Neurology Services, 1 month call for appointment.     Mariam Dollar, P.A.   ______________________________ [name],  M.D.    DA/MEDQ  D:  02/03/2016  T:  02/04/2016  Job:  [id number]  cc:   [name], M.D. Marvel Plan, M.D., PhD Elana Alm. Nicholos Johns, M.D.

## 2016-02-04 NOTE — Progress Notes (Signed)
Patient and his wife discussed the discharge instructions this am with Dr Baxter HireKristen and Jesusita Okaan. A ,PA.

## 2016-02-04 NOTE — Progress Notes (Signed)
Social Work  Discharge Note  The overall goal for the admission was met for:   Discharge location: Yes-HOME WITH WIFE WHO WILL PROVIDE 24 HR CARE  Length of Stay: Yes-20 DAYS  Discharge activity level: Yes-MIN ASSIST [place]  Home/community participation: Yes  Services provided included: MD, RD, PT, OT, SLP, RN, CM, TR, Pharmacy, Neuropsych and SW  Financial Services: Private Insurance: [place]  Follow-up services arranged: Home Health: [facility] DUE TO [employer] PREFERENCE-PT,OT,SP,RN, DME: [facility]-WHEELCHAIR, TUB BENCH AND BESIDE COMMODE and Patient/Family has no preference for HH/DME agencies  Comments (or additional information):WIFE WAS HERE DAILY LEARNING PT'S CARE AND PROVIDING SUPPORT. SHE HAS APPLIED FOR DISABILITY FOR PT AND WILL FOLLOW UP WITH THIS. REFERRAL MADE TO OUTPATIENT INTENSIVE SUBSTANCE ABUSE SERVICES VIA BEHAVIORAL HEALTH WILL CONTACT AT HOME TO ARRANGE APPOINTMENTS. ALSO GIVEN AA INFMORATION   Patient/Family verbalized understanding of follow-up arrangements: Yes  Individual responsible for coordination of the follow-up plan: [name]-WIFE  Confirmed correct DME delivered: Elease Hashimoto 02/04/2016    Elease Hashimoto

## 2016-02-04 NOTE — Progress Notes (Addendum)
Subjective/Complaints: hematuria last night, no pain with urination no sweats or chills. History of UTI  A couple weeks ago. No recent catheterization. History of bladder polyps. Currently on heparin subcutaneous  Review of systems: denies chest pain shortness of breath nausea vomiting diarrhea and constipation  Objective: Vital Signs: Blood pressure 164/67, pulse 54, temperature 98.9 F (37.2 C), temperature source Oral, resp. rate 18, weight 90.7 kg (199 lb 15.3 oz), SpO2 93 %. No results found. Results for orders placed or performed during the hospital encounter of 01/14/16 (from the past 72 hour(s))  Urinalysis, Routine w reflex microscopic (not at [facility])     Status: Abnormal   Collection Time: 02/04/16  6:19 AM  Result Value Ref Range   Color, Urine RED (A) YELLOW    Comment: BIOCHEMICALS MAY BE AFFECTED BY COLOR   APPearance TURBID (A) CLEAR   Specific Gravity, Urine 1.022 1.005 - 1.030   pH 5.5 5.0 - 8.0   Glucose, UA NEGATIVE NEGATIVE mg/dL   Hgb urine dipstick LARGE (A) NEGATIVE   Bilirubin Urine MODERATE (A) NEGATIVE   Ketones, ur 15 (A) NEGATIVE mg/dL   Protein, ur 30 (A) NEGATIVE mg/dL   Nitrite NEGATIVE NEGATIVE   Leukocytes, UA MODERATE (A) NEGATIVE  Urine microscopic-add on     Status: Abnormal   Collection Time: 02/04/16  6:19 AM  Result Value Ref Range   Squamous Epithelial / LPF 0-5 (A) NONE SEEN   WBC, UA 6-30 0 - 5 WBC/hpf   RBC / HPF TOO NUMEROUS TO COUNT 0 - 5 RBC/hpf   Bacteria, UA RARE (A) NONE SEEN  CBC with Differential/Platelet     Status: None   Collection Time: 02/04/16  7:12 AM  Result Value Ref Range   WBC 8.3 4.0 - 10.5 K/uL   RBC 4.58 4.22 - 5.81 MIL/uL   Hemoglobin 15.4 13.0 - 17.0 g/dL   HCT 78.4 69.6 - 29.5 %   MCV 99.6 78.0 - 100.0 fL   MCH 33.6 26.0 - 34.0 pg   MCHC 33.8 30.0 - 36.0 g/dL   RDW 28.4 13.2 - 44.0 %   Platelets 184 150 - 400 K/uL   Neutrophils Relative % 66 %   Neutro Abs 5.4 1.7 - 7.7 K/uL   Lymphocytes Relative 20  %   Lymphs Abs 1.7 0.7 - 4.0 K/uL   Monocytes Relative 10 %   Monocytes Absolute 0.8 0.1 - 1.0 K/uL   Eosinophils Relative 3 %   Eosinophils Absolute 0.3 0.0 - 0.7 K/uL   Basophils Relative 1 %   Basophils Absolute 0.0 0.0 - 0.1 K/uL    HEENT: Normocephalic, atraumatic.  Cardio: RRR Resp: CTA B/L and unlabored GI: BS positive and mild epigastric distention and tenderness Skin:   Warm and dry. Appears pale this morning Neuro:Cranial Nerve Abnormalities left central 7 Right facial weakness  Sensory: Diminished sensation left upper and left lower limb Motor: LUE/LLE; 0/5  RUE/RLE: 5/5 right upper extremity  Tone:  left elbow flexor 1/4, left finger flexors 1/4 as well left knee flexors Ashworth grade 2/4  Musc/Skel: No edema. No tenderness.  Gen. no acute distress. Vital signs reviewed.  Psych: Mood and affect normal, distracted  Assessment/Plan: 1. Functional deficits secondary to right hemiparesis causing left hemiplegia  Stable for D/C today F/u PCP in 3-4 weeks F/u PM&R 2 weeks See D/C summary See D/C instructions FIM: Function - Bathing Position: Shower Body parts bathed by patient: Left arm, Chest, Abdomen, Front perineal area,  Right upper leg, Left upper leg, Buttocks Body parts bathed by helper: Right arm, Back Bathing not applicable: Left lower leg, Right lower leg Assist Level: Touching or steadying assistance(Pt > 75%)  Function- Upper Body Dressing/Undressing Upper body dressing/undressing activity did not occur: Refused What is the patient wearing?: Pull over shirt/dress Pull over shirt/dress - Perfomed by patient: Thread/unthread right sleeve, Put head through opening, Pull shirt over trunk, Thread/unthread left sleeve Pull over shirt/dress - Perfomed by helper: Thread/unthread left sleeve Assist Level: Touching or steadying assistance(Pt > 75%) Function - Lower Body Dressing/Undressing What is the patient wearing?: Underwear, Pants, Socks, Shoes,  AFO Position: Wheelchair/chair at [employer] Underwear - Performed by patient: Pull underwear up/down, Thread/unthread right underwear leg Underwear - Performed by helper: Thread/unthread left underwear leg Pants- Performed by patient: Thread/unthread right pants leg, Pull pants up/down Pants- Performed by helper: Thread/unthread left pants leg Non-skid slipper socks- Performed by helper: Don/doff right sock, Don/doff left sock Socks - Performed by helper: Don/doff right sock, Don/doff left sock Shoes - Performed by patient: Don/doff right shoe Shoes - Performed by helper: Don/doff left shoe AFO - Performed by helper: Don/doff left AFO Assist for footwear: Maximal assist Assist for lower body dressing:  (Mod assist)  Function - Toileting Toileting activity did not occur: No continent bowel/bladder event Toileting steps completed by patient: Adjust clothing prior to toileting, Performs perineal hygiene Toileting steps completed by helper: Adjust clothing after toileting Toileting Assistive Devices: Grab bar or rail Assist level: Touching or steadying assistance (Pt.75%) (Mod assist)  Function - ArchivistToilet Transfers Toilet transfer activity did not occur: Safety/medical concerns Toilet transfer assistive device: Bedside commode, Grab bar Mechanical lift: Stedy Assist level to toilet: Moderate assist (Pt 50 - 74%/lift or lower) Assist level from toilet: Touching or steadying assistance (Pt > 75%) Assist level to bedside commode (at bedside): Touching or steadying assistance (Pt > 75%) Assist level from bedside commode (at bedside): Touching or steadying assistance (Pt > 75%)  Function - Chair/bed transfer Chair/bed transfer method: Squat pivot Chair/bed transfer assist level: Touching or steadying assistance (Pt > 75%) Chair/bed transfer assistive device: Armrests Chair/bed transfer details: Verbal cues for technique, Verbal cues for precautions/safety, Manual facilitation for weight shifting,  Manual facilitation for placement  Function - Locomotion: Wheelchair Will patient use wheelchair at discharge?: Yes Type: Manual Max wheelchair distance: 150 Assist Level: Supervision or verbal cues Assist Level: Supervision or verbal cues Wheel 150 feet activity did not occur: Safety/medical concerns Assist Level: Supervision or verbal cues Turns around,maneuvers to table,bed, and toilet,negotiates 3% grade,maneuvers on rugs and over doorsills: No Function - Locomotion: Ambulation Ambulation activity did not occur: Safety/medical concerns Assistive device: Walker-hemi Max distance: 50 Assist level: Moderate assist (Pt 50 - 74%) Assist level: Moderate assist (Pt 50 - 74%) Walk 50 feet with 2 turns activity did not occur: Safety/medical concerns Assist level: Moderate assist (Pt 50 - 74%) Walk 150 feet activity did not occur: Safety/medical concerns Walk 10 feet on uneven surfaces activity did not occur: Safety/medical concerns  Function - Comprehension Comprehension: Auditory Comprehension assist level: Follows complex conversation/direction with extra time/assistive device  Function - Expression Expression: Verbal Expression assist level: Expresses complex ideas: With extra time/assistive device  Function - Social Interaction Social Interaction assist level: Interacts appropriately 90% of the time - Needs monitoring or encouragement for participation or interaction.  Function - Problem Solving Problem solving assist level: Solves basic 75 - 89% of the time/requires cueing 10 - 24% of the time  Function -  Memory Memory assist level: Recognizes or recalls 75 - 89% of the time/requires cueing 10 - 24% of the time Patient normally able to recall (first 3 days only): Current season, That he or she is in a hospital  Medical Problem List and Plan: 1.Left hemiplegia secondary to right basal ganglia hemorrhage secondary to hypertensive crisis     discharge today PRAFO, splints  2.   DVT Prophylaxis/Anticoagulation: Subcutaneous heparin initiated 01/10/2016. Monitor for any bleeding episodes 3. Pain Management/headaches: Tylenol 3 as needed, Post stroke headaches started Topamax 25 twice a day, topical analgesic cream, left shoulder pain multifactorial, evidence of a capsulitis as well as anterio inferior subluxation and impingement syndrome. Patient also has  Hypersensitivity on the entire left side related to his CVA, start gabapentin 4. Dysphagia. Dysphagia 3 thin liquid diet being tolerated. 5. Neuropsych: This patient is not fully capable of making decisions on his own behalf. 6. Skin/Wound Care: Routine skin checks, Completed Keflex for left antecubital fossa cellulitis, superficial 7. Fluids/Electrolytes/Nutrition: Routine I&O's   changed to lactose-free diet- has helped with abdominal discomfort  poor intake, started Megace, this may also be cognitive based--eating 90-100% of meals ,  8. Hypertension. Norvasc 10 mg daily, Lopressor 75 mg twice a day.   Monitor with increased mobility  Controlled for the most part, monitor for hypotension with increase of tizanidine 9. History of alcohol use. Discussed need for continued cessation after discharge to home 10. Hyperlipidemia. Pravachol 11. Mood. Zoloft 50 mg daily. Provide emotional support 12. Urinary tract infection completed 7 days of Keflex  13. Cognitive deficits post stroke continue speech therapy still has poor awareness of deficits 14. Spasticity  Tizanidine, increased to 4mg  TID on 7/7  Baclofen 5mg  TID started on 7/4, increased to 20U on 7/5  Cont ROM 15. Hematuria probable recurrent UTI combined with anticoagulation. Will start on Keflex 250 mg 3 times a day 1 week. Patient is to follow up with urology. He goes to [facility]  LOS (Days) 21 A FACE TO FACE EVALUATION WAS PERFORMED  Esti Demello E 02/04/2016, 10:19 AM

## 2016-02-09 ENCOUNTER — Telehealth: Payer: Self-pay | Admitting: *Deleted
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
## 2016-02-09 NOTE — Telephone Encounter (Signed)
Transitional Care Questions Spoke with wife Jon [name]  Questions for our staff to ask patients on Transitional care 48 hour phone call:   1. Are you/is patient experiencing any problems since coming home? Are there any questions regarding any aspect of care? No except he has had a few near misses on falls and did actually fell out of wheel chair 02/08/16 pm while being transported into [facility]. No obvious injury. 2. Are there any questions regarding medications administration/dosing? Are meds being taken as prescribed? Patient should review meds with caller to confirm Taking medications 3. Have there been any falls? yes  See previous question #1 4. Has Home Health been to the house and/or have they contacted you? If not, have you tried to contact them? Can we help you contact them? They have been out ST OT PT 5. Are bowels and bladder emptying properly? Are there any unexpected incontinence issues? If applicable, is patient following bowel/bladder programs? No 6. Any fevers, problems with breathing, unexpected pain? No 7. Are there any skin problems or new areas of breakdown? No 8. Has the patient/family member arranged specialty MD follow up (ie cardiology/neurology/renal/surgical/etc)?  Can we help arrange? Appointment made with Kirsteins  9. Does the patient need any other services or support that we can help arrange? Not at this time 10. Are caregivers following through as expected in assisting the patient? Yes 11. Has the patient quit smoking, drinking alcohol, or using drugs as recommended?Yes      Appointment 02/11/16 with Dr Wynn BankerKirsteins @ 1:30, arrive by 1:00

## 2016-02-11 ENCOUNTER — Encounter: Payer: Self-pay | Admitting: Physical Medicine & Rehabilitation

## 2016-02-11 ENCOUNTER — Ambulatory Visit (HOSPITAL_BASED_OUTPATIENT_CLINIC_OR_DEPARTMENT_OTHER): Payer: PRIVATE HEALTH INSURANCE | Admitting: Physical Medicine & Rehabilitation

## 2016-02-11 ENCOUNTER — Encounter: Payer: Managed Care, Other (non HMO) | Attending: Physical Medicine & Rehabilitation

## 2016-02-11 VITALS — BP 144/78 | HR 69 | Resp 14

## 2016-02-11 DIAGNOSIS — I69319 Unspecified symptoms and signs involving cognitive functions following cerebral infarction: Secondary | ICD-10-CM | POA: Diagnosis not present

## 2016-02-11 DIAGNOSIS — I10 Essential (primary) hypertension: Secondary | ICD-10-CM | POA: Diagnosis not present

## 2016-02-11 DIAGNOSIS — Z5189 Encounter for other specified aftercare: Secondary | ICD-10-CM | POA: Diagnosis not present

## 2016-02-11 DIAGNOSIS — R252 Cramp and spasm: Secondary | ICD-10-CM | POA: Diagnosis not present

## 2016-02-11 DIAGNOSIS — T148 Other injury of unspecified body region: Secondary | ICD-10-CM

## 2016-02-11 DIAGNOSIS — T148XXA Other injury of unspecified body region, initial encounter: Secondary | ICD-10-CM

## 2016-02-11 DIAGNOSIS — F1010 Alcohol abuse, uncomplicated: Secondary | ICD-10-CM | POA: Insufficient documentation

## 2016-02-11 DIAGNOSIS — I610 Nontraumatic intracerebral hemorrhage in hemisphere, subcortical: Secondary | ICD-10-CM

## 2016-02-11 DIAGNOSIS — I69398 Other sequelae of cerebral infarction: Secondary | ICD-10-CM | POA: Diagnosis not present

## 2016-02-11 DIAGNOSIS — I639 Cerebral infarction, unspecified: Secondary | ICD-10-CM | POA: Diagnosis present

## 2016-02-11 DIAGNOSIS — G8194 Hemiplegia, unspecified affecting left nondominant side: Secondary | ICD-10-CM

## 2016-02-11 DIAGNOSIS — I629 Nontraumatic intracranial hemorrhage, unspecified: Secondary | ICD-10-CM | POA: Insufficient documentation

## 2016-02-11 DIAGNOSIS — R269 Unspecified abnormalities of gait and mobility: Secondary | ICD-10-CM

## 2016-02-11 NOTE — Patient Instructions (Addendum)
Okay to resume shaving Okay to resume sex Letter to employer written. Do not think any new damage was caused by fall. Records to Dr. Randa EvensEdwards can be forwarded by primary care Dr. Nicholos Johnseade Recommend no ibuprofen, no naproxen or Aleve for pain. May use regular or extra strength Tylenol.  Lap tray for wheelchair, please ask home health PT or OT. If they need prescription tell them to call me

## 2016-02-11 NOTE — Progress Notes (Signed)
Subjective:    Patient ID: Jon [name], male    DOB: 09/21/1955, 60 y.o.   MRN: [id number] 60 year old right-handed male with history of hypertension as well as alcohol use, on no prescription medications. Married, working full-time. Presented January 08, 2016, with acute onset of headache and left-sided weakness. Initial blood pressures were reportedly elevated. CT of the head showed right basal ganglia hemorrhage with 31 mL volume, 3 mm midline shift. CT angiogram of the head and neck with no extracranial stenosis or dissection. Echocardiogram with ejection fraction of 65%, no wall motion abnormalities. The patient did not receive tPA. Subcutaneous heparin for DVT prophylaxis initiated January 10, 2016. Dysphagia #1 nectar thick liquid diet. HPI Was discharged from inpatient rehabilitation on 02/04/2016. He is at home living with his wife. He is requiring 24-hour care. He is unable to get into the bathroom because of a narrow doorway. He is mainly in a wheelchair. Requiring assistance for dressing as well as bathing Home health PT, OT and speech have started. Home health nursing has not come out yet. Has not followed up yet with primary care physician. Appointment with Dr. Fulton Mole, 02/17/2016, at 4:30 PM. Patient also sees Dr. Randa Evens from [place], GI  Interval history-patient had a fall out of his wheelchair approximately 5 days ago. Was coming out of pharmacy and was on asphalt getting pushed by his wife in the wheelchair when they hit a pothole. Patient fell forward out of wheelchair and hit the ground. Patient initially hip with the right elbow and knee. Then he tried to get up but fell to his left side, landing on his left hip. Patient states that since the fall. He does have left hip pain, however, he did have some left hip pain while in the hospital.  Pain Inventory Average Pain 1 Pain Right Now 1 My pain is dull and aching  In the last 24 hours, has pain interfered  with the following? General activity 4 Relation with others 3 Enjoyment of life 3 What TIME of day is your pain at its worst? night Sleep (in general) Fair  Pain is worse with: inactivity Pain improves with: medication Relief from Meds: 3  Mobility use a wheelchair needs help with transfers  Function disabled: date disabled .  Neuro/Psych bladder control problems bowel control problems weakness numbness tremor trouble walking dizziness confusion  Prior Studies hospital f/u  Physicians involved in your care hospital f/u   No family history on file. Social History   Social History  . Marital Status: Married    Spouse Name: N/A  . Number of Children: N/A  . Years of Education: N/A   Social History Main Topics  . Smoking status: Never Smoker   . Smokeless tobacco: None  . Alcohol Use: 7.2 oz/week    0 Standard drinks or equivalent, 12 Glasses of wine per week     Comment: Wife reports chronic alcohol use.   . Drug Use: No  . Sexual Activity: Not Asked   Other Topics Concern  . None   Social History Narrative   History reviewed. No pertinent past surgical history. Past Medical History  Diagnosis Date  . Hypertension   . Liver damage    BP 144/78 mmHg  Pulse 69  Resp 14  SpO2 96%  Opioid Risk Score:   Fall Risk Score:  `1  Depression screen PHQ 2/9  No flowsheet data found.  Review of Systems  HENT: Negative.   Eyes: Negative.   Respiratory:  Positive for wheezing.   Cardiovascular: Negative.   Endocrine: Negative.   Genitourinary: Positive for difficulty urinating.  Musculoskeletal: Positive for gait problem.  Neurological: Positive for dizziness, tremors, weakness and numbness.  Psychiatric/Behavioral: Positive for confusion.  All other systems reviewed and are negative.      Objective:   Physical Exam  Constitutional: He is oriented to person, place, and time. He appears well-developed and well-nourished.  HENT:  Head:  Normocephalic and atraumatic.  Eyes: Conjunctivae and EOM are normal. Pupils are equal, round, and reactive to light.  Neck: Normal range of motion.  Cardiovascular: Normal rate, regular rhythm and normal heart sounds.   No murmur heard. Pulmonary/Chest: Effort normal and breath sounds normal. No respiratory distress. He has no wheezes.  Abdominal: Soft. Bowel sounds are normal. He exhibits distension. There is no tenderness.  Musculoskeletal:       Right shoulder: Normal.       Left shoulder: He exhibits decreased range of motion, tenderness, deformity, pain and decreased strength. He exhibits no spasm.       Right elbow: Normal.      Left elbow: Normal.       Right wrist: Normal.       Left wrist: Normal.       Right hip: Normal.       Left hip: He exhibits decreased strength and tenderness. He exhibits normal range of motion, no bony tenderness, no swelling, no crepitus, no deformity and no laceration.       Right knee: Normal.       Left knee: Normal.       Right ankle: Normal.       Left ankle: Normal.       Cervical back: He exhibits normal range of motion, no tenderness, no bony tenderness and no deformity.       Thoracic back: He exhibits no tenderness and no deformity.       Lumbar back: He exhibits no tenderness, no bony tenderness and no deformity.       Right upper arm: Normal.       Left upper arm: Normal.       Right forearm: Normal.       Left forearm: Normal.       Right hand: Normal.       Left hand: He exhibits no deformity. Decreased sensation noted. Decreased strength noted. He exhibits finger abduction, thumb/finger opposition and wrist extension trouble.  Left hip. No pain with range of motion. No pain over the Rader trochanter. There is 4 cm round area of bruising over the left gluteus medius muscle and left gluteus maximus muscle. No palpable hematoma  Left shoulder subluxation, with pain with external rotation as well as abduction and forward flexion.    Neurological: He is alert and oriented to person, place, and time. A sensory deficit is present. Coordination and gait abnormal.  Motor strength is 0/5 in the left deltoid, biceps, triceps, grip 2 minus, hip, knee extensor synergy, otherwise 0/5, left lower extremity. 5/5 in the right deltoid, biceps, triceps, grip, hip flexor, knee extensor, ankle dorsiflexor and plantar flexor  Reduced sensation to light touch and pinprick on the left side in the upper and lower limb.  Patient is nonambulatory. He requires min assist to stand, min to mod assist for transfers  Psychiatric: He has a normal mood and affect.  Nursing note and vitals reviewed.         Assessment & Plan:  1.  Left  hemiplegia due to R BG ICH Patient is requiring physical assistance and 24 7 care. His wife is providing this and doing a good job. Home health PT, OT, speech have started. However, nursing has not been out yet. We discussed several issues that the patient and his wife brought up. I feel that he is totally disabled from all work at the current time, and I estimate this will be the case for at least 6 months from the time of his stroke, I have written a letter to the employer in this regard  I also filled out a handicap placard form to cover 6 months  Patient had questions about shaving, he may shave since he is off of Lovenox  He is unable to drive until cleared by M.D  Follow-up neurology next month  Physical medicine and rehabilitation follow-up in 6 weeks  2. Left  Hip pain after fall. After further examination, it is over the gluteus muscle and not in the hip joint area. I do not think he needs x-ray or other imaging studies. I do think his pain will resolve with time. Discussed. No nonsteroidal anti-inflammatories. Other than topical agents advised May use Tylenol regular or extra strength.  3. Hypertension. Will continue current antihypertensive medications and follow-up with primary care  physician, Dr. Elias Else  . 4. Spasticity. He is both on baclofen and tizanidine. May discontinue tizanidine

## 2016-02-15 ENCOUNTER — Ambulatory Visit (HOSPITAL_COMMUNITY): Payer: Managed Care, Other (non HMO) | Admitting: Psychology

## 2016-02-15 DIAGNOSIS — F1020 Alcohol dependence, uncomplicated: Secondary | ICD-10-CM | POA: Insufficient documentation

## 2016-02-15 NOTE — Progress Notes (Unsigned)
Jon [name] is a 60 y.o. male patient. Orientation to CD-IOP: The patient is a 60 yo married, white, male seeking entry into the CD-IOP. He was accompanied today by his wife of 31 years. He was referred here by his physician to address his alcohol dependence. The patient suffered a stroke on June 16 of this year and has some paralysis on his left side. The patient is currently using a wheel chair. When asked why he was here, the patient reported that when he recovers he wants to know how to stop drinking. He admitted that this stroke has proven devastating for him and "I am here to save my life". The patient suffers from hypertension and had not taking care of this prior to his stroke. He also suffers from what he called, "beer drinker's heart murmur", and his liver enzymes were elevated. In the past month they have returned to near-normal levels. The patient was teary at times and reported it is hard not to be depressed with the physical changes from the stroke. 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 It was dangerous work and he admitted he still has nightmares of the saw. 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 They are 'keeping his job', but it is unclear whether he will be able to return to full-time employment and how much physical strength and mobility he will regain. The patient was raised in [place] and graduated from [employer]. He grew up in an alcoholic household and both parents were alcoholics. He described his childhood as good. While his 23 yo father continues to drink, the patient's mother died at age 1 of cirrhosis.  Both of his maternal grandparents were alcoholics as was a paternal uncle. The patient is the oldest of 3 children and the youngest sibling, a brother, is an alcoholic. He stated that his brother has been sober from  alcohol for almost 7 years, but still smokes pot. He stated his brother has had 10 DWI's over the years. The middle child, his sister, is not chemically dependent. The patient and his wife have one daughter who lives next door to them. She is s 59 yo and is on disability because of her 'anxiety'. She refuses to take medications, but has had significant behavioral problems through the years. She became pregnant in 2011, but gave up the child for adoption. The patient reported he had a DWI in 1991. When asked about other problems that were caused by his alcohol use, the patient's wife pointed out that he would get mean and throw things. At one point, she took out a protective order. He no longer experiences the personality changes from alcohol that he once did. With help, the patient can get into a chair and although he cannot write at length, he can sign his name. He was very cooperative and the orientation was completed accordingly. The patient will return on Wednesday and begin the CD-IOP.        Colonel Krauser, LCAS

## 2016-02-17 ENCOUNTER — Other Ambulatory Visit (HOSPITAL_COMMUNITY): Payer: Managed Care, Other (non HMO) | Attending: Medical | Admitting: Psychology

## 2016-02-17 ENCOUNTER — Telehealth: Payer: Self-pay | Admitting: Physical Medicine & Rehabilitation

## 2016-02-17 ENCOUNTER — Other Ambulatory Visit (HOSPITAL_COMMUNITY): Payer: Managed Care, Other (non HMO)

## 2016-02-17 DIAGNOSIS — I69354 Hemiplegia and hemiparesis following cerebral infarction affecting left non-dominant side: Secondary | ICD-10-CM | POA: Insufficient documentation

## 2016-02-17 DIAGNOSIS — Z87891 Personal history of nicotine dependence: Secondary | ICD-10-CM | POA: Diagnosis not present

## 2016-02-17 DIAGNOSIS — F4310 Post-traumatic stress disorder, unspecified: Secondary | ICD-10-CM | POA: Diagnosis not present

## 2016-02-17 DIAGNOSIS — Z993 Dependence on wheelchair: Secondary | ICD-10-CM | POA: Insufficient documentation

## 2016-02-17 DIAGNOSIS — I10 Essential (primary) hypertension: Secondary | ICD-10-CM | POA: Diagnosis not present

## 2016-02-17 DIAGNOSIS — F1020 Alcohol dependence, uncomplicated: Secondary | ICD-10-CM | POA: Diagnosis not present

## 2016-02-17 DIAGNOSIS — F329 Major depressive disorder, single episode, unspecified: Secondary | ICD-10-CM | POA: Diagnosis not present

## 2016-02-17 NOTE — Telephone Encounter (Signed)
Patient's wife, Rosey Bath called and would like to know what is causing her husband to be cold all the time since he left the hospital. She states he freezes all the time and piles blankets on when he is in bed. Rosey Bath states the thermostat is not kept on low and that the patient even complained about being cold when she took him for a walk outside. She would like to know if it is a side effect from one of his medications. Please advise.

## 2016-02-17 NOTE — Telephone Encounter (Signed)
Mainly a problem of poor mobility not meds

## 2016-02-18 ENCOUNTER — Other Ambulatory Visit (HOSPITAL_COMMUNITY): Payer: Managed Care, Other (non HMO) | Admitting: Psychology

## 2016-02-18 DIAGNOSIS — F1020 Alcohol dependence, uncomplicated: Secondary | ICD-10-CM | POA: Diagnosis not present

## 2016-02-18 NOTE — Telephone Encounter (Signed)
Pt's wife has been advised. She did state that he had an oral temp of 101.0 F yesterday morning. I had her check his temp again while I was on the phone and it said 101.0 F. Per AK, advised pt to go to urgent care or see PCP.

## 2016-02-22 ENCOUNTER — Encounter (HOSPITAL_COMMUNITY): Payer: Self-pay | Admitting: Medical

## 2016-02-22 ENCOUNTER — Other Ambulatory Visit (HOSPITAL_COMMUNITY): Payer: Managed Care, Other (non HMO) | Admitting: Psychology

## 2016-02-22 VITALS — BP 122/72 | HR 60 | Ht 70.0 in | Wt 160.0 lb

## 2016-02-22 DIAGNOSIS — F1020 Alcohol dependence, uncomplicated: Secondary | ICD-10-CM

## 2016-02-22 DIAGNOSIS — M62838 Other muscle spasm: Secondary | ICD-10-CM

## 2016-02-22 DIAGNOSIS — I69359 Hemiplegia and hemiparesis following cerebral infarction affecting unspecified side: Secondary | ICD-10-CM

## 2016-02-22 DIAGNOSIS — I69319 Unspecified symptoms and signs involving cognitive functions following cerebral infarction: Secondary | ICD-10-CM

## 2016-02-22 DIAGNOSIS — I619 Nontraumatic intracerebral hemorrhage, unspecified: Secondary | ICD-10-CM

## 2016-02-22 DIAGNOSIS — IMO0002 Reserved for concepts with insufficient information to code with codable children: Secondary | ICD-10-CM

## 2016-02-22 DIAGNOSIS — I10 Essential (primary) hypertension: Secondary | ICD-10-CM

## 2016-02-22 NOTE — Progress Notes (Signed)
    Daily Group Progress Note  Program: CD-IOP   02/22/2016 JARMAINE EHRLER [id number] '@[email or web address]'$ @  Sobriety Date: 01-08-16  Patient met with Medical Director: (Y/N) N  Group Time: 1-2:30  Participation Level: Minimal  Behavioral Response: Appropriate and Sharing  Type of Therapy: Process Group  Interventions: CBT, Motivational Interviewing, Solution Focused and Reframing  Topic: The group used the first half of group to process events that occurred outside of group related to recovery.  The counselor and members were able to provide feedback to individuals struggling with certain aspects of sobriety.  Group Time: 2:45-4  Participation Level: Minimal  Behavioral Response: Appropriate and Sharing  Type of Therapy: Psycho-education Group  Interventions: Other: Family Sculpture  Topic: The second half of group focused on psycho-education.  The group employed a psychodrama technique, Family Sculpting, to discuss families and family structures.  Each member is provided the opportunity to share a pivotal moment related to their own family through sculpting group members to reenact the scene.  This allows the individual, as well as the other members, to discuss the dynamics of the family, feelings associated with the scene, and how the moment is part of their addiction.   Summary: Patient stated that he has not attended a meeting. Counselor and group realized that [facility] is only two blocks away from his house, so he has the potential to try a meeting there in the future. Patient shared that he struggles with boredom and findings things to do after his stroke. Counselor helped the patient to think through new activities that he could potentially enjoy.  Patient stated that he is currently attending physical therapy. Youlanda Roys, COunselor   UDS collected: No Results: none yet  AA/NA attended?: No  Sponsor?: No   [name], LCAS 02/22/2016 10:18 AM
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
## 2016-02-22 NOTE — Progress Notes (Signed)
    Daily Group Progress Note  Program: CD-IOP   02/22/2016 Jon [name] [id number] '@[email or web address]'$ @  Sobriety Date: 01-08-16  Patient met with Medical Director: (Y/N)  Group Time: 1-2:30  Participation Level: Minimal  Behavioral Response: Appropriate and Sharing  Type of Therapy: Process Group  Interventions: CBT, Motivational Interviewing and Solution Focused  Topic: The group used the first half of group to process events that occurred outside of group related to recovery.  The counselor and members were able to provide feedback to individuals struggling with certain aspects of sobriety.    Group Time: 2:45-4  Participation Level: Minimal  Behavioral Response: Appropriate and Sharing  Type of Therapy: Psycho-education Group  Interventions: Other: Family Sculpture  Topic: The second half of group focused on psychoeducation.  The group employed a psychodrama technique, Family Sculpting, to discuss families and family structures.  Each member is provided the opportunity to share a pivotal moment related to their own family through sculpting group members to reenact the scene.  This allows the individual, as well as the other members, to discuss the dynamics of the family, feelings associated with the scene, and how the moment is part of their addiction.   Summary: Client briefly shared his experience with addiction with the group.  Client stated he is unsure if his addiction contributed to his recent stroke, but does not want to continue to drink and contribute to more health issues.  Client shared things between him and his wife are stressful due to her taking care of him.  Jon [name], Counselor   UDS collected: No Results:   AA/NA attended?: No  Sponsor?: No   Jon [name], LCAS 02/22/2016 8:48 AM

## 2016-02-23 NOTE — Progress Notes (Signed)
    Daily Group Progress Note  Program: CD-IOP   02/23/2016 ARPAN ESKELSON [id number]  Diagnosis:  Alcohol use disorder, severe, dependence ([place])  Hemorrhagic stroke ([place]) R basal ganglia d/t HTN  Hemiparesis affecting nondominant side as late effect of cerebrovascular accident [facility])  Essential hypertension  Depression due to stroke [facility])  Cognitive deficit, post-stroke  Muscle spasticity   Sobriety Date: 01/08/16  Patient met with Medical Director: Aggie Moats) Y  Group Time: 1-2:30  Participation Level: Minimal  Behavioral Response: Appropriate  Type of Therapy: Process Group  Interventions: Motivational Interviewing and Solution Focused  Topic: Patients and counselor discussed issues related to patient's sobriety and challenges they are facing. Counselor worked to grow motivation to change behavior and hold members accountable to the goals they had set. Patients participated in an outdoor activity aimed at [place] in recovery-thinking. One new patient met with Investment banker, operational.     Group Time: 2:45-4  Participation Level: Minimal  Behavioral Response: Appropriate  Type of Therapy: Psycho-education Group  Interventions: Psychosocial Skills: Recognizing Triggers  Topic: Patients and counselor discussed "Red and Green Flags" from Seeking Safety curriculum for PTSD and Substance Abuse. Patients shared about their most-likely "high risk situations" and how they could prevent a return to use.    Summary: Patient was mildly active during group process and saw program medical director for most of 2nd half of group. Patient shared that he attended his first [facility] meeting over the weekend. He reported that he felt depressed over the weekend since he is still recovering from a recent stroke and is not able to move like he used to. Patient stated that he is trying to "look at the positives" of his situation. Patient minimizes his substance abuse and focuses more on  his physical condition and his mental health status when checking in. He does participate in group discussion but avoids discussing himself. Youlanda Roys, Counselor   UDS collected: No Results: negative  AA/NA attended?: [place]  Sponsor?: No   [name], [place] 02/23/2016 5:10 PM

## 2016-02-24 ENCOUNTER — Other Ambulatory Visit (HOSPITAL_COMMUNITY): Payer: Managed Care, Other (non HMO) | Attending: Medical | Admitting: Psychology

## 2016-02-24 DIAGNOSIS — F1020 Alcohol dependence, uncomplicated: Secondary | ICD-10-CM | POA: Insufficient documentation

## 2016-02-25 ENCOUNTER — Other Ambulatory Visit (HOSPITAL_COMMUNITY): Payer: Self-pay | Admitting: Medical

## 2016-02-25 ENCOUNTER — Other Ambulatory Visit (HOSPITAL_COMMUNITY): Payer: Managed Care, Other (non HMO) | Admitting: Psychology

## 2016-02-25 DIAGNOSIS — F1020 Alcohol dependence, uncomplicated: Secondary | ICD-10-CM | POA: Diagnosis not present

## 2016-02-25 NOTE — Progress Notes (Signed)
    Daily Group Progress Note  Program: CD-IOP   02/25/2016 DEZMIN KITTELSON [id number]  Diagnosis:  Alcohol use disorder, severe, dependence ([place])   Sobriety Date: 01/08/16  Patient met with Medical Director: (Y/N) N  Group Time: 1-2:30  Participation Level: Active  Behavioral Response: Appropriate and Sharing  Type of Therapy: Process Group  Interventions: Motivational Interviewing and Solution Focused  Topic: The group used the first half of group to process events that occurred outside of group related to recovery.  The counselor and members were able to provide feedback to individuals struggling with certain aspects of sobriety.     Group Time: 2:45-4  Participation Level: Active  Behavioral Response: Appropriate and Sharing  Type of Therapy: Psycho-education Group  Interventions: Other: Recognizing Triggers  Topic: The second half of group focused on psycho-education.  The group employed a handout on key points about red and green flags in sobriety. The handout addresses flags of spiraling, talking about feelings, getting help from others, budding, and the most vulnerable time of relapse. The handout helps to bring awareness of potential weaknesses in recovery.    Summary: Patient has not attended any meetings, but was going to try to attend one on Wednesday. Patient shared that his physical therapy was going really well, and that he was starting to walk. Patient also mentioned a convenience store that was by his house that he has completely avoided. Counselor processed feelings around the store, and how to avoid that location to avoid temptation.  Patient had a seemingly minor fall. Please see attached incident report for details. Youlanda Roys, Counselor   UDS collected: No Results:   AA/NA attended?: NoThursday  Sponsor?: No   [name], LCAS 02/25/2016 4:52 PM

## 2016-02-29 ENCOUNTER — Other Ambulatory Visit (HOSPITAL_COMMUNITY): Payer: Managed Care, Other (non HMO) | Admitting: Psychology

## 2016-02-29 DIAGNOSIS — F1020 Alcohol dependence, uncomplicated: Secondary | ICD-10-CM | POA: Diagnosis not present

## 2016-03-01 ENCOUNTER — Encounter (HOSPITAL_COMMUNITY): Payer: Self-pay | Admitting: Psychology

## 2016-03-01 NOTE — Progress Notes (Signed)
Tawanna CoolerGeorge D Baldini is a [age] y.o. male patient. Discharge from CD-IOP. Please see the discharge plan listed below. Date of Discharge: 03/01/2016  Date of Admission: 02/17/16 Course of Treatment: The patient was referred to the CD-IOP while in the hospital after suffering a stroke in mid-June of this year. He appeared for the orientation 2 months later after gaining strength through physical therapy at home. The patient stated he wanted to gain the skills and strategies to address his alcohol use before he had the mobility and independence to purchase alcohol. Once, enrolled, the patient engaged rarely in group and his comments were frequently unrelated to topic. He was focused on his physical recovery, which is understandable, but stated he was tired of people telling him what to do. The patient stated he would not go to the required (4) 12-step meetings per week and would rather come here just once a week. We agreed that he should focus on regaining his physical strength and ability to walk and then discuss options going forward.  Referrals: Continue with physical therapy. Seek entry into process groups with stroke victims to share his angst and concerns.  Aftercare Services: The [employer] of AA, Sponsor and Step-Work. ? Attend AA/NA meetings 7  times per week ? Obtain a sponsor and a home group in AA/NA ? Continue/enter individual counseling or therapy Plan of Action to address Continuing Problems: The patient is encouraged to focus on his recovery from a stroke in mid-June. He is slowly regaining a limited ability to walk, but there remains much to do. He is encouraged to remain engaged in the Fellowship of AA and attending daily meetings at the [employer], just 2 blocks from his home. Once he has regained his independence and mobility, he has been recommended to return to the CD-IOP and/or individual counseling [facility].        Charmian MuffAnn Gabrian Hoque, LCAS

## 2016-03-01 NOTE — Progress Notes (Signed)
    Daily Group Progress Note  Program: CD-IOP   03/01/2016 TRISTIN VANDEUSEN [id number]  Diagnosis:  Alcohol use disorder, severe, dependence ([place])   Sobriety Date: 01/08/16  Group Time: 1-2:30  Participation Level: Minimal  Behavioral Response: Resistant, Drowsy and Evasive  Type of Therapy: Process Group  Interventions: Motivational Interviewing  Topic: Patients met with counselors for group process session for 1.5 hours. Discussion focused on patient recovery from mind-altering drugs and alcohol. Patients were active and engaged. Urinalysis tests were collected for multiple patients. One patient graduated today and met with the program director for her discharge. One new patient was present and met with the program director.      Group Time: 2:45-4  Participation Level: Minimal  Behavioral Response: Resistant, Drowsy and Minimizing  Type of Therapy: Psycho-education Group  Interventions: Assertiveness Training  Topic: Patients met with counselors for group psychoeducation session with a lesson from "Seeking Safety" on "Asking for Help". Patients shared about their experiences with asking others for help. Counselors encouraged patients to try new methods and reframed the "weakness" associated with asking for help as "empowerment".    Summary: Patient presented for group today as somewhat engaged and somewhat active. He reported that he was sleepy over the weekend and worked with his physical therapist on getting feeling back on his left side. He stated that he is improving his walking abilities and hopes to one day be able to regain control and feeling of his left hand and leg. He spent the weekend doing PT, resting, and he attended 1 AA meeting which he said made him feel hopeful since there were many people he could relate to. Youlanda Roys, Counselor   UDS collected: No Results: Pending  AA/NA attended?: YesSunday  Sponsor?: No   [name], [place] 03/01/2016 3:59 PM

## 2016-03-02 ENCOUNTER — Encounter (HOSPITAL_COMMUNITY): Payer: Self-pay | Admitting: Psychology

## 2016-03-02 ENCOUNTER — Other Ambulatory Visit (HOSPITAL_COMMUNITY): Payer: Managed Care, Other (non HMO) | Admitting: Medical

## 2016-03-02 ENCOUNTER — Encounter (HOSPITAL_COMMUNITY): Payer: Self-pay | Admitting: Medical

## 2016-03-02 LAB — PRESCRIPTION ABUSE MONITORING 17P, URINE
6-Acetylmorphine, Urine: NEGATIVE ng/mL
AMPHETAMINE SCREEN URINE: NEGATIVE ng/mL
BARBITURATE SCREEN URINE: NEGATIVE ng/mL
BENZODIAZEPINE SCREEN, URINE: NEGATIVE ng/mL
BUPRENORPHINE, URINE: NEGATIVE ng/mL
CANNABINOIDS UR QL SCN: NEGATIVE ng/mL
COCAINE(METAB.)SCREEN, URINE: NEGATIVE ng/mL
Carisoprodol/Meprobamate, Ur: NEGATIVE ng/mL
Creatinine(Crt), U: 104.6 mg/dL (ref 20.0–300.0)
EDDP, Urine: NEGATIVE ng/mL
FENTANYL, URINE: NEGATIVE pg/mL
MDMA Screen, Urine: NEGATIVE ng/mL
METHADONE SCREEN, URINE: NEGATIVE ng/mL
Meperidine Screen, Urine: NEGATIVE ng/mL
Nitrite Urine, Quantitative: NEGATIVE ug/mL
OPIATE SCREEN URINE: NEGATIVE ng/mL
OXYCODONE+OXYMORPHONE UR QL SCN: NEGATIVE ng/mL
Ph of Urine: 5.6 (ref 4.5–8.9)
Phencyclidine Qn, Ur: NEGATIVE ng/mL
Propoxyphene Scrn, Ur: NEGATIVE ng/mL
SPECIFIC GRAVITY: 1.007
TAPENTADOL, URINE: NEGATIVE ng/mL
Tramadol Screen, Urine: NEGATIVE ng/mL

## 2016-03-02 LAB — ETHYL GLUCURONIDE, URINE: ETHYL GLUCURONIDE SCREEN, UR: NEGATIVE ng/mL

## 2016-03-02 NOTE — Progress Notes (Signed)
  [facility] Discharge Summary   Jon CoolerGeorge D Akerley [id number]  Date of Admission: Date of Discharge:  Course of Treatment:  Goals and Activities to Help Maintain Sobriety: 1. Stay away from old friends who continue to drink and use mind-altering chemicals. 2. Continue practicing Fair Fighting rules in interpersonal conflicts. 3. Continue alcohol and drug refusal skills and call on support systems. 4.   Referrals:   1. Aftercare services:Attend AA/NA meetings 4 times per week. 2. Obtain a sponsor and a home group in AA/NA. 3. Return to Psychotherapist:   Next appointment:  Plan of Action to Address Continuing Problems:     Client has not participated in the development of this discharge plan and has not received a copy of this completed plan  Jon MornCharles Jisselle [name]  03/02/2016   Jon Mornharles Leily Capek, PA-C 03/02/2016

## 2016-03-02 NOTE — Progress Notes (Signed)
    Daily Group Progress Note  Program: CD-IOP   03/02/2016 Jon [name] [id number]  Diagnosis:  No diagnosis found.   Sobriety Date: 01/08/16  Group Time: 1-2:30 pm  Participation Level: Active  Behavioral Response: Sharing  Type of Therapy: Process Group  Interventions: Motivational Interviewing  Topic: Process: Patients met with counselors for a process-oriented discussion about recovery. Patients shared about their cravings, thoughts, and challenges to sobriety. They shared about their treatment goals progress and how they were supporting their recovery. One drug test was collected.   Group Time: 2:45- 4pm  Participation Level: Minimal  Behavioral Response: Resistant  Type of Therapy: Psycho-education Group  Interventions: Systems analyst  Topic: Psycho-Education:  Patients met with counselors for a psychoeducation-oriented discussion about the [employer]. Counselors encouraged patients to increase their public/arena square and patients shared about the dangers of trusting others. Counselor reframed patients' hesitations as risks necessary to authenticity and relationships. Patients participated in an art therapy exercise involving a Paper Mache mask and the "2 sides of their personality". Patients were active and engaged. The session ended with a graduation. Members enjoyed brownies and shared their feelings of hope and admiration for the graduating member.   Summary: The patient reported he had not attended any meetings since our last group session. He mentioned his brother-in-law goes to meetings every day, but did not respond when I suggested that he could go with him. The patient reported he had watched TV, but he kept falling asleep. "I can't stay awake for anything", he stated. The patient attributed his sedation to the medications he is prescribed for the symptoms and problems from the stroke he had back in mid-June. The patient expressed frustration about  being so dependent on others and his displeasure with his wife telling him what to do all the time. In the psycho-ed, the patient shared something about his mother and how she had died early from cirrhosis. He insisted she had not taken up drinking until she was almost 59 yo, but had succumbed quickly to alcoholism. His comments had little to do with the topic at hand and it is unclear whether this patient is cognitively capable of engaging in this sort of setting based on the damage he incurred from the stroke. He refused to attempt to draw anything on the mask and one of the staff members helped him write some words. The patient wished the graduating member well. He is scheduled to meet with this counselor for an individual session and we will discuss whether he is benefiting from the program. He has attended 5 group sessions to date, but really has yet to say anything that enhances the discussions and or relates to the topics being addressed. He seems focused on his physical disabilities to such a degree that it is unclear whether he really can benefit from this program at this time in his life.   UDS collected: Yes Results: negative  AA/NA attended?: No, and he has only attended 1 AA meeting at the [employer] since he entered the program. This patient must increase attendance if he is to remain in this program.  Sponsor?: No   [name], [place] 03/02/2016 10:13 AM

## 2016-03-03 ENCOUNTER — Other Ambulatory Visit (HOSPITAL_COMMUNITY): Payer: Managed Care, Other (non HMO)

## 2016-03-03 NOTE — Progress Notes (Addendum)
  [facility] Summary   Jon CoolerGeorge D Castor [id number]  Date of Admission:02/17/2016 Date of Discharge: 03/01/2016  Course of Treatment: The patient was referred to the CD-IOP while in the hospital after suffering a stroke in mid-June of this year. He appeared for the orientation 2 months later after gaining strength through physical therapy at home. The patient stated he wanted to gain the skills and strategies to address his alcohol use before he had the mobility and independence to purchase alcohol. Once, enrolled, the patient engaged rarely in group and his comments were frequently unrelated to topic. He was focused on his physical recovery, which is understandable, but stated he was tired of people telling him what to do. The patient stated he would not go to the required (4) 12-step meetings per week and would rather come here just once a week. We agreed that he should focus on regaining his physical strength and ability to walk and then discuss options going forward.  Referrals: Continue with physical therapy. Seek entry into process groups with stroke victims to share his angst and concerns.  Aftercare Services: The [employer] of AA, Sponsor and Step-Work.  Attend AA/NA meetings 7  times per week  Obtain a sponsor and a home group in AA/NA  Continue/enter individual counseling or therapy Plan of Action to address Continuing Problems: The patient is encouraged to focus on his recovery from a stroke in mid-June. He is slowly regaining a limited ability to walk, but there remains much to do. He is encouraged to remain engaged in the Fellowship of AA and attending daily meetings at the [employer], just 2 blocks from his home. Once he has regained his independence and mobility, he has been recommended to return to the CD-IOP and/or individual counseling [facility].           Client has NOT participated in the development of this  discharge plan and has NOT received a copy of this completed plan  Maryjean MornCharles Grettell Ransdell  03/03/2016   Maryjean Mornharles Faye Strohman, PA-C 03/03/2016

## 2016-03-07 ENCOUNTER — Other Ambulatory Visit (HOSPITAL_COMMUNITY): Payer: Managed Care, Other (non HMO)

## 2016-03-08 ENCOUNTER — Other Ambulatory Visit: Payer: Self-pay | Admitting: Neurology

## 2016-03-08 ENCOUNTER — Telehealth: Payer: Self-pay | Admitting: Physical Medicine & Rehabilitation

## 2016-03-08 NOTE — Telephone Encounter (Signed)
Tizanidine was started by Dr. Wynn BankerKirsteins when he was in inpt rehab. I have not prescribed this medication to my patients yet and I am not familiar with this medication. Please ask pt to call Dr. Wynn BankerKirsteins for refill. He saw Dr. Wynn BankerKirsteins on 02/11/16. Thanks.   Marvel PlanJindong Malaki Koury, MD PhD Stroke Neurology 03/08/2016 5:53 PM

## 2016-03-08 NOTE — Telephone Encounter (Signed)
Pt's wife called said the tiZANidine (ZANAFLEX) 4 MG tablet needs refills and they need to know if Dr Roda ShuttersXu thinks he should continue taking it. Please call

## 2016-03-08 NOTE — Telephone Encounter (Signed)
FYI

## 2016-03-08 NOTE — Telephone Encounter (Signed)
Jasmine DecemberSharon OT with [facility] wanted to let us know that patient has declined any futher visits with her.  PT is still seeing patient, but has declined her.  Any questions please call her at [phone].

## 2016-03-09 ENCOUNTER — Other Ambulatory Visit (HOSPITAL_COMMUNITY): Payer: Managed Care, Other (non HMO)

## 2016-03-09 ENCOUNTER — Telehealth: Payer: Self-pay

## 2016-03-09 NOTE — Telephone Encounter (Signed)
Pt's wife called to refill Tizanidine. According to the last OV note, AK stated to discontinue this med. Left message on answer machine to inform pt that a refill will not be done.

## 2016-03-09 NOTE — Telephone Encounter (Signed)
Rn call patients wife to give her Dr. Roda ShuttersXu message. Rn stated per Dr. Roda ShuttersXu note , another MD prescribed the zanaflex. Rn stated Dr. Camille BalAndrew Kristeins prescribed the meds and needs to be consulted. Pts wife was given another number to call the office. Pts wife verbalized understanding.

## 2016-03-10 ENCOUNTER — Other Ambulatory Visit (HOSPITAL_COMMUNITY): Payer: Managed Care, Other (non HMO)

## 2016-03-14 ENCOUNTER — Other Ambulatory Visit (HOSPITAL_COMMUNITY): Payer: Managed Care, Other (non HMO)

## 2016-03-16 ENCOUNTER — Other Ambulatory Visit (HOSPITAL_COMMUNITY): Payer: Managed Care, Other (non HMO)

## 2016-03-17 ENCOUNTER — Other Ambulatory Visit (HOSPITAL_COMMUNITY): Payer: Managed Care, Other (non HMO)

## 2016-03-21 ENCOUNTER — Other Ambulatory Visit (HOSPITAL_COMMUNITY): Payer: Managed Care, Other (non HMO)

## 2016-03-24 ENCOUNTER — Encounter: Payer: Managed Care, Other (non HMO) | Attending: Physical Medicine & Rehabilitation

## 2016-03-24 ENCOUNTER — Encounter: Payer: Self-pay | Admitting: Physical Medicine & Rehabilitation

## 2016-03-24 ENCOUNTER — Ambulatory Visit (HOSPITAL_BASED_OUTPATIENT_CLINIC_OR_DEPARTMENT_OTHER): Payer: Managed Care, Other (non HMO) | Admitting: Physical Medicine & Rehabilitation

## 2016-03-24 VITALS — BP 132/71 | HR 56 | Resp 14

## 2016-03-24 DIAGNOSIS — I69319 Unspecified symptoms and signs involving cognitive functions following cerebral infarction: Secondary | ICD-10-CM

## 2016-03-24 DIAGNOSIS — G8194 Hemiplegia, unspecified affecting left nondominant side: Secondary | ICD-10-CM

## 2016-03-24 DIAGNOSIS — I629 Nontraumatic intracranial hemorrhage, unspecified: Secondary | ICD-10-CM | POA: Diagnosis not present

## 2016-03-24 DIAGNOSIS — R252 Cramp and spasm: Secondary | ICD-10-CM | POA: Insufficient documentation

## 2016-03-24 DIAGNOSIS — F1010 Alcohol abuse, uncomplicated: Secondary | ICD-10-CM | POA: Diagnosis not present

## 2016-03-24 DIAGNOSIS — I639 Cerebral infarction, unspecified: Secondary | ICD-10-CM | POA: Diagnosis present

## 2016-03-24 DIAGNOSIS — I69398 Other sequelae of cerebral infarction: Secondary | ICD-10-CM

## 2016-03-24 DIAGNOSIS — Z5189 Encounter for other specified aftercare: Secondary | ICD-10-CM | POA: Insufficient documentation

## 2016-03-24 DIAGNOSIS — I10 Essential (primary) hypertension: Secondary | ICD-10-CM | POA: Insufficient documentation

## 2016-03-24 DIAGNOSIS — R269 Unspecified abnormalities of gait and mobility: Secondary | ICD-10-CM | POA: Diagnosis not present

## 2016-03-24 NOTE — Patient Instructions (Addendum)
Will transition to outpatient therapy  Ask Dr Roda ShuttersXu about Aspirin  Patient is  Unable to work, will have permanent disability

## 2016-03-24 NOTE — Progress Notes (Signed)
Subjective:    Patient ID: Jon [name], male    DOB: February 16, 1956, 60 y.o.   MRN: [id number] 60 year old right-handed male with history of hypertension as well as alcohol use, on no prescription medications. Married, working full-time. Presented January 08, 2016, with acute onset of headache and left-sided weakness. Initial blood pressures were reportedly elevated. CT of the head showed right basal ganglia hemorrhage with 31 mL volume, 3 mm midline shift. CT angiogram of the head and neck with no extracranial stenosis or dissection. Echocardiogram with ejection fraction of 65%, no wall motion abnormalities. The patient did not receive tPA. HPI   Appointment with Dr. Fulton Mole, 02/17/2016, at 4:30 PM.  Discussed urinary problems.  Has urology appt on 05/03/2016 He started going to behavioral health for substance abuse counseling. He is now switched to Alcoholics Anonymous since these are people more his own age.   Patient is noting some left shoulder pain. We discussed potential etiologies for this. This pain is mainly when he is raising his arm over his head. This occurs mainly with dressing. He still requires assistance for ADLs as well as ambulation. He does not really ambulate much with his wife, mainly with physical therapy. He has received possibly 8 weeks of home health physical therapy  Pain Inventory Average Pain 1 Pain Right Now 0 My pain is intermittent, sharp and aching  In the last 24 hours, has pain interfered with the following? General activity 0 Relation with others 3 Enjoyment of life 0 What TIME of day is your pain at its worst? night Sleep (in general) Fair  Pain is worse with: inactivity Pain improves with: no selection Relief from Meds: 2  Mobility walk with assistance use a walker ability to climb steps?  no do you drive?  no use a wheelchair needs help with transfers  Function disabled: date disabled . I need assistance with the following:   dressing, bathing, toileting, meal prep, household duties and shopping  Neuro/Psych bladder control problems weakness numbness trouble walking loss of taste or smell  Prior Studies Any changes since last visit?  no  Physicians involved in your care Any changes since last visit?  no   No family history on file. Social History   Social History  . Marital status: Married    Spouse name: N/A  . Number of children: N/A  . Years of education: N/A   Social History Main Topics  . Smoking status: Former Smoker    Packs/day: 0.50    Years: 19.00    Types: Cigarettes    Quit date: 02/22/2000  . Smokeless tobacco: Never Used  . Alcohol use 7.2 oz/week    12 Glasses of wine per week     Comment: Wife reports chronic alcohol use.   . Drug use:     Types: Marijuana     Comment: teens to early 20's  . Sexual activity: Not Asked   Other Topics Concern  . None   Social History Narrative  . None   Past Surgical History:  Procedure Laterality Date  . TONSILLECTOMY AND ADENOIDECTOMY Bilateral    Age 23   Past Medical History:  Diagnosis Date  . Hypertension   . Liver damage    BP 132/71 (BP Location: Right Arm, Patient Position: Sitting, Cuff Size: Large)   Pulse (!) 56   Resp 14   SpO2 95%   Opioid Risk Score:   Fall Risk Score:  `1  Depression screen PHQ 2/9  No  flowsheet data found.  Review of Systems  HENT: Negative.   Eyes: Negative.   Respiratory: Negative.   Cardiovascular: Positive for leg swelling.  Gastrointestinal: Positive for abdominal pain.  Endocrine: Negative.   Genitourinary: Positive for difficulty urinating.  Musculoskeletal: Positive for gait problem.  Allergic/Immunologic: Negative.   Neurological: Positive for weakness and numbness.  Psychiatric/Behavioral: Negative.        Objective:   Physical Exam  Constitutional: He is oriented to person, place, and time. He appears well-developed and well-nourished.  HENT:  Head: Normocephalic  and atraumatic.  Eyes: Conjunctivae and EOM are normal. Pupils are equal, round, and reactive to light.  Neck: Normal range of motion.  Neurological: He is alert and oriented to person, place, and time.  0/5 strength in the left deltoid, biceps, triceps, grip, hip flexor, trace knee extensor, 0/5  ankle dorsiflexor.  Sensation is absent to light touch in the left upper and left lower limb    Psychiatric: His affect is blunt. His speech is delayed. He is slowed.  Nursing note and vitals reviewed.   Visual intact to confrontation      Assessment & Plan:  1.  Right basal ganglia ICH with left hemiparesis Unfortunately, the patient has had very little neurologic recovery of strength. He also has had no recovery of his sensation. Cont HH PT, OT, SLP until outpatient PT, OT and speech can be arranged  No driving Anticipate permanent disability From all vocations  Over half of the 25 min visit was spent counseling and coordinating care.

## 2016-04-01 ENCOUNTER — Telehealth: Payer: Self-pay | Admitting: Registered Nurse

## 2016-04-01 MED ORDER — BACLOFEN 10 MG PO TABS: 30.0000 mg | ORAL_TABLET | Freq: Three times a day (TID) | ORAL | 3 refills | Status: DC

## 2016-04-01 NOTE — Telephone Encounter (Signed)
Reprinted prescription to send to pharmacy

## 2016-04-01 NOTE — Telephone Encounter (Signed)
[name]. Prescription sent

## 2016-04-04 ENCOUNTER — Ambulatory Visit: Payer: 59 | Attending: Physical Medicine & Rehabilitation | Admitting: Occupational Therapy

## 2016-04-04 ENCOUNTER — Encounter: Payer: Self-pay | Admitting: Occupational Therapy

## 2016-04-04 DIAGNOSIS — I69318 Other symptoms and signs involving cognitive functions following cerebral infarction: Secondary | ICD-10-CM | POA: Diagnosis present

## 2016-04-04 DIAGNOSIS — M6281 Muscle weakness (generalized): Secondary | ICD-10-CM

## 2016-04-04 DIAGNOSIS — R471 Dysarthria and anarthria: Secondary | ICD-10-CM | POA: Insufficient documentation

## 2016-04-04 DIAGNOSIS — R41841 Cognitive communication deficit: Secondary | ICD-10-CM | POA: Insufficient documentation

## 2016-04-04 DIAGNOSIS — I69352 Hemiplegia and hemiparesis following cerebral infarction affecting left dominant side: Secondary | ICD-10-CM | POA: Diagnosis not present

## 2016-04-04 DIAGNOSIS — R278 Other lack of coordination: Secondary | ICD-10-CM | POA: Diagnosis present

## 2016-04-04 NOTE — Therapy (Signed)
[facility] [address] [place], [place], [address] Phone: [phone]   Fax:  [phone]  Occupational Therapy Evaluation  Patient Details  Name: Jon [name] MRN: [id number] Date of Birth: [birth year] Referring Provider: Dr Claudette Laws  Encounter Date: 04/04/2016      OT End of Session - 04/04/16 1122    Visit Number 1   Date for OT Re-Evaluation 05/30/16   Authorization Type UHC ([employer]) Front office sent email to Conan Bowens to check authorization   OT Start Time [phone]   OT Stop Time [phone]   OT Time Calculation (min) 66 min   Activity Tolerance Patient tolerated treatment well   Behavior During Therapy [facility] for tasks assessed/performed      Past Medical History:  Diagnosis Date  . Hypertension   . Liver damage     Past Surgical History:  Procedure Laterality Date  . TONSILLECTOMY AND ADENOIDECTOMY Bilateral    Age 36    There were no vitals filed for this visit.      Subjective Assessment - 04/04/16 0828    Subjective  Pt reports that in June 2017 he noticed a sharp pain over his right eye and "got up to blow my nose and I fell went right to the ground"  Pt was dx w/ CVA with Left hemiparesis. 6/16-6/22/17 Hospital admission; 01/14/16-02/04/16 in-pt rehab unit at [facility] .   Patient is accompained by: Family member   Patient Stated Goals Get my left arm going again and have more control of my left foot.   Currently in Pain? No/denies   Multiple Pain Sites No           [facility] OT Assessment - 04/04/16 0001      Assessment   Diagnosis CVA w/ L hemiparesis (CT scan revealed right basal ganglia hemorrhage w/ 31mL volume, 3mm midline shift)   Referring Provider Dr Claudette Laws   Onset Date 01/08/16   Assessment Pt cont to require assistance with ADL's and Ambulation/functional moblity   Prior Therapy In-pt rehab and home health therapy (approximately 8 weeks PT) until  04/01/16     Precautions   Precautions Fall;Other (comment)  Intermittent supervision; No driving; Assistance w/ amb   Required Braces or Orthoses --  Left pre-fab resting hand splintNoc time foot brace, day tim     Restrictions   Other Position/Activity Restrictions --  Uses hemi walker     Balance Screen   Has the patient fallen in the past 6 months Yes   How many times? --  1 fall ~ 3 weeks ago   Is the patient reluctant to leave their home because of a fear of falling?  Yes     Home  Environment   Family/patient expects to be discharged to: Private residence   Living Arrangements Spouse/significant other   Type of Home House   Home Access Ramped entrance   Home Layout One level   Alternate Level Stairs - Number of Steps 2 STE front entrance   Bathroom Shower/Tub Tub/Shower unit;Curtain   Shower/tub characteristics Curtain   Office manager Accessibility No   How accessible --  Pt uses hemi walker in bathroom w/ assist; grab bars    Home Equipment Bedside commode;Tub bench;Grab bars - toilet;Grab bars - tub/shower;Hand held shower head;Wheelchair - Careers adviser (comment)   Additional Comments --  Hemi walker   Lives With Significant other     Prior Function  Level of Independence Independent   Vocation Full time employment   Vocation Requirements 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   Grooming Simulated   Grooming Patient Percentage 90%   Where Assess - Grooming --  Assist to put toothpaste on brush   Upper Body Bathing Supervision/safety   Lower Body Bathing Supervision/safety  Supervision - Min A    Upper Body Dressing Minimal assistance   Lower Body Dressing Moderate assistance;Maximal assistance   Toilet Tranfer Supervision/safety  supervision - min assistance   Toileting - Clothing  Manipulation Maximal assistance   Toileting -  Hygiene Minimal assistance   Tub/Shower Transfer Minimal assistance   Transfers/Ambulation Related to ADL's --  Pt requires supervision -max A for ADL/transfers   ADL comments Pt does not ambulate much with his wife, prefers to ambulate with PT. Often will require assistance with ADL's but prefers if spouse is more supervision level.      IADL   Light Housekeeping Needs help with all home maintenance tasks;Does not participate in any housekeeping tasks   Meal Prep Does not utilize stove or oven  Pt reports that spouse gets all meals;sometimes gets snack   Prior Level of Function Community Mobility Pt was I all activity prior to this    [employer] on family or friends for transportation   Prior Level of Function Meal Prep --  Was independent w/ meal prep prior to this CVA. Did cooking   Medication Management Has difficulty remembering to take medication   Prior Level of Function Financial Management --  Pt spouse has always done bills etc.     Mobility   Mobility Status History of falls;Needs assist   Mobility Status Comments --  Pt amb w/ hemi walker w/ supervision and uses manual w/c     Written Expression   Dominant Hand Left   Written Experience Unable to assess (comment)     Vision - History   Baseline Vision Wears glasses all the time   Additional Comments Peripherial deficits on left     Vision Assessment   Visual Fields Left visual field deficit  Peripherial field deficit left    Comment Continue to assess in functional context     Activity Tolerance   Activity Tolerance Tolerate 30+ min activity without fatigue   Activity Tolerance Comments --  Pt/spouse reports that endurance is slowly improving     Cognition   Overall Cognitive Status Impaired/Different from baseline   Area of Impairment Memory;Problem solving   Memory Decreased recall of precautions;Decreased short-term memory   Problem Solving Slow  processing   Awareness Impaired     Observation/Other Assessments   Other Surveys  Select   Quick DASH  84.09% impaired (left hemiparesis) LHD male     Sensation   Light Touch Impaired by gross assessment;Impaired Detail   Semmes Weinstein Monofilament Scale Unresponsive     Coordination   Gross Motor Movements are Fluid and Coordinated No   Fine Motor Movements are Fluid and Coordinated No   Other Pt is w/o AROM; No movement     Edema   Edema --  L hand/fingers moderate edema noted visually compared to R     Tone   Assessment Location Left Upper Extremity     Hand Function  Left Hand Gross Grasp Impaired   Comment --  No AROM; grip or funct use L dominant UE. Flaccid     LUE Tone   LUE Tone Flaccid       Hemiplegia exercises and self PROM LUE (scapula pinches; self PROM for shoulder flexion supine; seated shoulder forward flexion at tabletop). Handout issued and reviewed w/ pt and spouse in clinic today. Edema control techniques reviewed with pt/spouse in clinic today - keep arm elevated, propped up, use RUE to assist with positioning of left etc as pt was noted to be unaware at times of LUE hanging at his side when seated in w/c x2 during assessment.                 OT Education - 04/04/16 1121    Education provided Yes   Education Details Hemi exercises and self ROM; edema control techniques and positioning of LUE/2* decreased awareness.   Person(s) Educated Patient;Spouse   Methods Explanation;Demonstration;Verbal cues;Tactile cues;Handout   Comprehension Verbalized understanding;Need further instruction          OT Short Term Goals - 04/04/16 1306      OT SHORT TERM GOAL #1   Title Pt will I state precautions/signs and symptoms of CVA   Baseline VC's   Time 4   Period Weeks   Status New     OT SHORT TERM GOAL #2   Title Pt will demonstrate Mod I HEP for LUE self ROM    Baseline VC's and tc's   Time 4   Period Weeks   Status New     OT  SHORT TERM GOAL #3   Title Pt will I state 2-3 edema control techniques LUE    Baseline VC's/Min A   Time 4   Period Weeks   Status New           OT Long Term Goals - 04/04/16 1307      OT LONG TERM GOAL #1   Title Pt will be Mod I upgrade HEP LUE   Time 8   Period Weeks   Status New     OT LONG TERM GOAL #2   Title Pt will be Mod I simple snack prep in ADL kitchen (using manual w/c vs hemi walker).   Time 8   Period Weeks   Status New     OT LONG TERM GOAL #3   Title Pt will be Mod I basic ADL transfers using hemi walker    Time 8   Period Weeks   Status New     OT LONG TERM GOAL #4   Title Pt will be supervision LB dressing w/ a/e or DME PRN, using 1 handed technique   Time 8   Period Weeks   Status New     OT LONG TERM GOAL #5   Title Pt will be supervision level simulated bathing UB/LB using 1 handed technique   Time 8   Period Weeks   Status New               Plan - 04/04/16 1303    OT Duration 8 weeks      Patient will benefit from skilled therapeutic intervention in order to improve the following deficits and impairments:  Abnormal gait, Decreased balance, Decreased endurance, Difficulty walking, Impaired sensation, Impaired vision/preception, Pain, Increased edema, Decreased range of motion, Decreased cognition, Decreased activity tolerance, Decreased coordination, Decreased knowledge of use of DME, Decreased safety awareness, Decreased strength, Impaired UE  functional use  Visit Diagnosis: Hemiplegia and hemiparesis following cerebral infarction affecting left dominant side (HCC) - Plan: Ot plan of care cert/re-cert  Muscle weakness (generalized) - Plan: Ot plan of care cert/re-cert  Other symptoms and signs involving cognitive functions following cerebral infarction - Plan: Ot plan of care cert/re-cert    Problem List Patient Active Problem List   Diagnosis Date Noted  . Alcohol use disorder, severe, dependence (HCC) 02/15/2016  .  Muscle contusion 02/11/2016  . Left flaccid hemiparesis (HCC)   . Muscle spasticity   . Poor appetite   . Hyperlipidemia 01/14/2016  . Depression 01/14/2016  . Obesity 01/14/2016  . Hyperglycemia 01/14/2016  . Urinary tract infection, site not specified 01/14/2016  . Basal ganglia hemorrhage (HCC) 01/14/2016  . Hemiparesis affecting nondominant side as late effect of cerebrovascular accident (HCC)   . Cognitive deficit, post-stroke   . Left hemiparesis (HCC)   . Essential hypertension   . Dysphagia, post-stroke   . Gait disturbance, post-stroke   . Hyponatremia   . Thrombocytopenia (HCC)   . Hemorrhagic stroke (HCC) R basal ganglia d/t HTN 01/08/2016    Micheale Schlack Dionicio StallBeth Dixon, OTR/L 04/04/2016, 1:12 PM  [facility] [address] [place], [place], [address] Phone: [phone]   Fax:  [phone]  Name: Jon CoolerGeorge D [name] MRN: [id number] Date of Birth: 1956/04/26

## 2016-04-04 NOTE — Patient Instructions (Signed)
Hemiplegia exercises and self PROM LUE (scapula pinches; self PROM for shoulder flexion supine; seated shoulder forward flexion at tabletop). Handout issued and reviewed w/ pt and spouse in clinic today. Edema control techniques reviewed with pt/spouse in clinic today - keep arm elevated, propped up, use RUE to assist with positioning of left etc as pt was noted to be unaware at times of LUE hanging at his side when seated in w/c x2 during assessment.

## 2016-04-05 ENCOUNTER — Ambulatory Visit: Payer: 59 | Admitting: Speech Pathology

## 2016-04-05 ENCOUNTER — Ambulatory Visit: Payer: 59

## 2016-04-05 DIAGNOSIS — I69352 Hemiplegia and hemiparesis following cerebral infarction affecting left dominant side: Secondary | ICD-10-CM | POA: Diagnosis not present

## 2016-04-05 DIAGNOSIS — R41841 Cognitive communication deficit: Secondary | ICD-10-CM

## 2016-04-05 DIAGNOSIS — R278 Other lack of coordination: Secondary | ICD-10-CM

## 2016-04-05 DIAGNOSIS — M6281 Muscle weakness (generalized): Secondary | ICD-10-CM

## 2016-04-05 DIAGNOSIS — I69318 Other symptoms and signs involving cognitive functions following cerebral infarction: Secondary | ICD-10-CM

## 2016-04-05 NOTE — Patient Instructions (Signed)
  Cognitive Activities you can do at home:   - Solitaire  - Majong  - Scrabble  - Chess/Checkers  - Crosswords (easy level)  - Juanna CaoUno  - Card Games  - Board Games  - Connect 4  - Simon  - the Memory Game  - Dominoes  -Jig Saw  Get a 3 ring binder and put in therapy calendar, and get sections for PT/OT/ST

## 2016-04-05 NOTE — Therapy (Signed)
[facility] [address] [place], [place], [address] Phone: [phone]   Fax:  [phone]  Physical Therapy Evaluation  Patient Details  Name: Jon [name] MRN: [id number] Date of Birth: 21-Apr-1956 Referring Provider: Dr. Wynn Banker  Encounter Date: 04/05/2016      PT End of Session - 04/05/16 1357    Visit Number 1   Number of Visits 17   Date for PT Re-Evaluation 06/04/16   Authorization Type UHC and Medicare? G-CODE AND PROGRESS NOTE EVERY 10TH VISIT.    PT Start Time 1101   PT Stop Time 1146   PT Time Calculation (min) 45 min   Equipment Utilized During Treatment Gait belt   Activity Tolerance Patient tolerated treatment well   Behavior During Therapy WFL for tasks assessed/performed      Past Medical History:  Diagnosis Date  . Hypertension   . Liver damage     Past Surgical History:  Procedure Laterality Date  . TONSILLECTOMY AND ADENOIDECTOMY Bilateral    Age 60    There were no vitals filed for this visit.       Subjective Assessment - 04/05/16 1109    Subjective *Pt goes by Jon [name].* Pt reported he had a stroke (R basal ganglia ICH per chart) on 01/08/16, which affected L side. Pt finished HHPT 04/01/16, and reports incr. L knee pain 2/2 L knee OA (pt reported). Pt is using a hemi-walker to amb. Pt reported HHPT was 4x/week and did not have a HEP, and pt reports hamstring stretches (performed by PT) really helped. Pt has pedal bike at home.  Pt has 2.5lbs ankle weights at home.    Patient is accompained by: Family member   Pertinent History HTN, ETOH abuse per pt's wife but pt has not consumed alcohol since ICH, liver damage   Patient Stated Goals Control ankle movement better and improve placement of the foot.    Currently in Pain? No/denies            [facility] PT Assessment - 04/05/16 1116      Assessment   Medical Diagnosis G81.94 (ICD-10-CM) - Left hemiparesis [facility])   Referring Provider  Dr. Wynn Banker   Onset Date/Surgical Date 01/08/16   Hand Dominance Left   Prior Therapy Inpt rehab and HHPT     Precautions   Precautions Fall   Required Braces or Orthoses Other Brace/Splint  L AFO, resting hand splint     Restrictions   Weight Bearing Restrictions No     Balance Screen   Has the patient fallen in the past 6 months Yes   How many times? 5   Has the patient had a decrease in activity level because of a fear of falling?  Yes   Is the patient reluctant to leave their home because of a fear of falling?  Yes     Home Environment   Living Environment Private residence   Living Arrangements Spouse/significant other   Available Help at Discharge Family   Type of Home House   Home Access Ramped entrance   Home Layout One level   Home Equipment Wheelchair - manual;Tub bench;Grab bars - tub/shower;Bedside commode  hemi-walker     Prior Function   Level of Independence Independent   Vocation Full time employment   Vocation Requirements Pt worked as a Armed forces logistics/support/administrative officer)   Leisure Product/process development scientist and work in the yard and Dance movement psychotherapist   Overall Cognitive Status Within [employer]  Limits for tasks assessed   Area of Impairment Memory;Problem solving   Memory Decreased short-term memory   Problem Solving Slow processing   Attention Selective;Alternating   Selective Attention Impaired   Memory Impaired     Sensation   Light Touch Impaired by gross assessment  Decr. LLE light touch   Additional Comments Pt reported numbness in L UE/LE.     Coordination   Gross Motor Movements are Fluid and Coordinated No   Fine Motor Movements are Fluid and Coordinated No   Heel Heritage manager. L LE 2/2 weakness.     Posture/Postural Control   Posture/Postural Control Postural limitations   Postural Limitations Rounded Shoulders     Tone   Assessment Location Left Lower Extremity     ROM / Strength   AROM / PROM / Strength AROM;Strength     AROM   Overall  AROM  Deficits   Overall AROM Comments RLE AROM WNL. LLE reduced in all ranges 2/2 weakness.     Strength   Overall Strength Deficits   Overall Strength Comments R LE: grossly 4/5. LLE: hip flex: 2-/5, knee ext: 2/5, knee flex: 2/5, DF not tested as pt has L AFO donned. B seated hip abd/add: 1+/5.      Transfers   Transfers Sit to Stand;Stand to Sit   Sit to Stand 4: Min assist;With upper extremity assist;From chair/3-in-1   Sit to Stand Details Verbal cues for sequencing;Verbal cues for technique;Tactile cues for initiation;Tactile cues for weight shifting   Sit to Stand Details (indicate cue type and reason) Cues for ant. weight shifting.   Stand to Sit 4: Min guard;With upper extremity assist;To chair/3-in-1   Comments Cues to reach back for w/c arm rest to ensure safety.     Ambulation/Gait   Ambulation/Gait Yes   Ambulation/Gait Assistance 4: Min guard;4: Min assist   Ambulation/Gait Assistance Details Pt required intermittent min A to maintain balance during gait and turns. Intermittent step through gait vs. step-to   Ambulation Distance (Feet) 100 Feet   Assistive device Hemi-walker   Gait Pattern Step-to pattern;Decreased arm swing - right;Step-through pattern;Decreased stride length;Decreased dorsiflexion - left;Decreased hip/knee flexion - left;Decreased weight shift to left;Left circumduction  L foot ER   Ambulation Surface Level;Indoor   Gait velocity 0.75ft/sec.     Standardized Balance Assessment   Standardized Balance Assessment Timed Up and Go Test     Timed Up and Go Test   TUG Normal TUG   Normal TUG (seconds) 65.7  with hemi-walker     LLE Tone   LLE Tone Other (comment);Within Functional Limits  PT noted catch in L knee ext, but more likely HS tightness.                           PT Education - 04/05/16 1356    Education provided Yes   Education Details PT educate pt on PT frequency/duration and outcome measure results.    Person(s)  Educated Patient;Spouse   Methods Explanation   Comprehension Verbalized understanding          PT Short Term Goals - 04/05/16 1406      PT SHORT TERM GOAL #1   Title Pt will verbalize understanding of risk factors and signs/sx's of CVA in order to reduce risk of addtional CVA. TARGET DATE FOR ALL STGS: 05/03/16   Status New     PT SHORT TERM GOAL #2   Title Perform BERG  and write goal prn.    Status New     PT SHORT TERM GOAL #3   Title Pt will amb. 200' with LRAD and S over even terrain to improve functional mobility.    Status New     PT SHORT TERM GOAL #4   Title Pt will improve gait speed with LRAD to >/=0.73ft/sec. to improve functional mobility.    Status New           PT Long Term Goals - 04/05/16 1408      PT LONG TERM GOAL #1   Title Pt will be IND in HEP to improve flexibility, strength, and balance. TARGET DATE FOR ALL LTGS: 05/31/16   Status New     PT LONG TERM GOAL #2   Title Pt will improve gait speed to >/=1.55ft/sec with LRAD to decr. falls risk.    Status New     PT LONG TERM GOAL #3   Title Pt will amb. 500' over even and paved surfaces at with LRAD at MOD I level to improve functional mobility.    Status New     PT LONG TERM GOAL #4   Title Pt will report zero falls over the last 4 weeks to improve safety during functional mobility.    Status New               Plan - 04/05/16 1357    Clinical Impression Statement Pt is a pleasant 60y/o male presenting to OPPT neuro s/p R basal ganglia ICH in 12/2015. Pt received inpt services through 02/13/16 and HHPT through 04/01/16. Pt's PMH significant for the following: HTN, ETOH abuse per pt's wife but pt has not consumed alcohol since ICH, liver damage. The following impairments were noted during the exam: gait deviations, decreased strength, impaired flexibilty, decr. endurance, cognitive impairments, and impaired sensation. No LLE tone noted, but difficult to assess as pt's L hamstring muscultare  flexibilty decreased. Pt did require verbal cues to attend to task intermittently during session. Pt's gait speed and TUG time indicate pt is at risk for falls.  OT spoke with PT after pt's eval, to inform PT that pt's wife told OT (privately) that pt has experienced behavioral issues and made inappropriate comments to their dtr, after ICH. Pt's wife stated pt would be upset if she told therapists in front of pt.    Rehab Potential Good   Clinical Impairments Affecting Rehab Potential co-morbidities   PT Frequency 2x / week   PT Duration 8 weeks   PT Treatment/Interventions ADLs/Self Care Home Management;Biofeedback;Canalith Repostioning;Electrical Stimulation;Cognitive remediation;Neuromuscular re-education;Balance training;Therapeutic exercise;Therapeutic activities;Functional mobility training;Stair training;Gait training;DME Instruction;Orthotic Fit/Training;Patient/family education;Wheelchair mobility training;Manual techniques;Vestibular   PT Next Visit Plan CVA ed. BERG balance test and intiate strengthening and balance HEP.   Consulted and Agree with Plan of Care Patient;Family member/caregiver   Family Member Consulted wife-[name]      Patient will benefit from skilled therapeutic intervention in order to improve the following deficits and impairments:  Abnormal gait, Decreased endurance, Impaired sensation, Decreased knowledge of use of DME, Decreased strength, Impaired UE functional use, Decreased mobility, Decreased balance, Decreased range of motion, Decreased cognition, Impaired flexibility, Postural dysfunction  Visit Diagnosis: Hemiplegia and hemiparesis following cerebral infarction affecting left dominant side (HCC) - Plan: PT plan of care cert/re-cert  Muscle weakness (generalized) - Plan: PT plan of care cert/re-cert  Other symptoms and signs involving cognitive functions following cerebral infarction - Plan: PT plan of care cert/re-cert  Other lack of coordination -  Plan:  PT plan of care cert/re-cert      G-Codes - 04/05/16 1411    Functional Assessment Tool Used Gait speed with hemi-walker: 0.1538ft/sec; TUG with hemi-walker: 65.7sec.   Functional Limitation Mobility: Walking and moving around   Mobility: Walking and Moving Around Current Status [phone]([id number]) At least 60 percent but less than 80 percent impaired, limited or restricted   Mobility: Walking and Moving Around Goal Status [phone]([id number]) At least 20 percent but less than 40 percent impaired, limited or restricted       Problem List Patient Active Problem List   Diagnosis Date Noted  . Alcohol use disorder, severe, dependence (HCC) 02/15/2016  . Muscle contusion 02/11/2016  . Left flaccid hemiparesis (HCC)   . Muscle spasticity   . Poor appetite   . Hyperlipidemia 01/14/2016  . Depression 01/14/2016  . Obesity 01/14/2016  . Hyperglycemia 01/14/2016  . Urinary tract infection, site not specified 01/14/2016  . Basal ganglia hemorrhage (HCC) 01/14/2016  . Hemiparesis affecting nondominant side as late effect of cerebrovascular accident (HCC)   . Cognitive deficit, post-stroke   . Left hemiparesis (HCC)   . Essential hypertension   . Dysphagia, post-stroke   . Gait disturbance, post-stroke   . Hyponatremia   . Thrombocytopenia (HCC)   . Hemorrhagic stroke (HCC) R basal ganglia d/t HTN 01/08/2016    Avice Funchess L 04/05/2016, 2:13 PM  [facility] [address] [place], [place], [address] Phone: [phone]   Fax:  [phone]  Name: Tawanna CoolerGeorge D Snyders MRN: [id number] Date of Birth: [birth year]  Zerita BoersJennifer Hagar Sadiq, PT,DPT 04/05/16 2:14 PM Phone: [phone] Fax: [phone]

## 2016-04-05 NOTE — Therapy (Signed)
[facility] [address] [place], [place], [address] Phone: [phone]   Fax:  [phone]  Speech Language Pathology Evaluation  Patient Details  Name: Jon [name] MRN: [id number] Date of Birth: 05/10/1956 Referring Provider: Dr. Claudette Laws   Encounter Date: 04/05/2016      End of Session - 04/05/16 1745    Authorization Type 20 ST       Past Medical History:  Diagnosis Date  . Hypertension   . Liver damage     Past Surgical History:  Procedure Laterality Date  . TONSILLECTOMY AND ADENOIDECTOMY Bilateral    Age 60    There were no vitals filed for this visit.          SLP Evaluation [facility] - 04/05/16 0948      SLP Visit Information   SLP Received On 04/05/16   Referring Provider Dr. Claudette Laws    Onset Date 02/07/16   Medical Diagnosis ICH     Subjective   Subjective "My thinking is a little slow - if something is 10 steps, I can only get to 8"   Patient/Family Stated Goal To get my math back     Pain Assessment   Currently in Pain? No/denies     General Information   HPI Pt reports that in June 2017 he noticed a sharp pain over his right eye and "got up to blow my nose and I fell went right to the ground"  Pt was dx w/ CVA with Left hemiparesis. 6/16-6/22/17 Hospital admission; 01/14/16-02/04/16 in-pt rehab unit at [facility] .   Mobility Status arrived in wheelchair - receiving PT     Prior Functional Status   Cognitive/Linguistic Baseline Within functional limits   Type of Home House    Lives With Spouse   Available Support Family   Vocation On disability     Cognition   Overall Cognitive Status Within Functional Limits for tasks assessed   Area of Impairment Memory;Problem solving   Memory Decreased recall of precautions;Decreased short-term memory   Attention Selective;Alternating   Selective Attention Impaired   Memory Impaired   Memory Impairment Retrieval  deficit;Decreased recall of new information   Awareness Impaired   Awareness Impairment Intellectual impairment   Problem Solving Impaired   Problem Solving Impairment Verbal basic;Functional basic   Executive Function Reasoning;Organizing;Decision Making;Self Monitoring   Reasoning Impaired   Organizing Impaired   Decision Making Impaired   Self Monitoring Impaired   Behaviors Verbal agitation     Visual Recognition/Discrimination   Discrimination Within Function Limits     Verbal Expression   Overall Verbal Expression Appears within functional limits for tasks assessed                         SLP Education - 04/05/16 1229    Education provided Yes   Education Details areas of impairment, goals for ST, cognitive activities he can do at home, binder for tx exercises/info   Person(s) Educated Patient;Spouse   Methods Explanation;Demonstration;Handout;Verbal cues   Comprehension Verbalized understanding;Need further instruction;Verbal cues required          SLP Short Term Goals - 04/05/16 1738      SLP SHORT TERM GOAL #1   Title Pt will selectively attend to details on mildly complex cognitive linguistic task with distractions and 85% accuracy.   Time 4   Period Weeks   Status New     SLP SHORT  TERM GOAL #2   Title Pt will perform mildy complex organization, reasoning and time/money problems with 85% accuracy and rare min A   Time 4   Period Weeks   Status New     SLP SHORT TERM GOAL #3   Title Pt  will utlize external memory aids/book to recall tasks, errands, lists, and reduce repetitive question asking/frustration over 3 sessions with occasional min A.   Time 4   Period Weeks   Status New     SLP SHORT TERM GOAL #4   Title Pt will face and maintain eye contact conversation partner on his left side with rare min A for 5 minutes conversation.    Time 4   Period Weeks   Status New          SLP Long Term Goals - 04/05/16 1734      SLP LONG  TERM GOAL #1   Title Pt will alterate attention between 2 mildly complex cognitive linguistic tasks with 85% on each and occasional min A   Time 8   Period Weeks   Status New     SLP LONG TERM GOAL #2   Title Pt will solve moderately complex reasoning, functional math and organization problems with 90% accuracy and occasional min A   Time 8   Period Weeks   Status New     SLP LONG TERM GOAL #3   Title Pt will utilize external aids for medication management with rare min A over 3 sessions.   Time 8   Period Weeks   Status New          Plan - 04/05/16 1507    Clinical Impression Statement Jon [name] is a 60 y.o. male who suffered a CVA on 01/08/16. He was hosptialized until 02/04/16 and received ST on inpt rehab and HHST. Today he presents with moderate cognitive linguistic impairments, primarily attention, problem solving/reasoning and memory.  Jon [name] has left UE weakness and is left handed. His wife is managing his medications and schedule. Portions of the MoCA version 2 were administered. He recalled 3/5 words with a delay and demonstrated slow processing on all tasks. Attention to detail was impaired on sentece repetitions and most notably on check writing/balancing task where Jon [name] did not attend to the date or details in the directions. Simple basic money and time problems are relatively intact but slow. More complex problem solving such as finding the balance in simple checkbook amounts resuslted in reduced organization with errors not in the math but in analyzing and selecting which amounts to add or subtract to get the balance. He consistently attended to only one check and got the balance for that check but not the other. Spouse reports personality changes with pt becoming frustrated when routines are changed or when she is not able to do what he wants in his time frame. Pt with noteable left inattention, keeping his head turned to the right while I sat on his left. When asked  about this he replied "I never looked at people when I was talking to them, indicating poor awareness of his impairments. He also stated his memory is fine, again with impaired awareness. 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 I recommend Jon [name] receive skilled ST to maximize his cognition for possible return to work   Speech Therapy Frequency 2x /week   Treatment/Interventions Compensatory strategies;Functional tasks;Patient/family education;Cueing hierarchy;Cognitive reorganization;Internal/external aids;SLP instruction and  feedback   Potential to Achieve Goals Good   Consulted and Agree with Plan of Care Patient;Family member/caregiver   Family Member Consulted spouse, [name]      Patient will benefit from skilled therapeutic intervention in order to improve the following deficits and impairments:   Cognitive communication deficit - Plan: SLP plan of care cert/re-cert    Problem List Patient Active Problem List   Diagnosis Date Noted  . Alcohol use disorder, severe, dependence (HCC) 02/15/2016  . Muscle contusion 02/11/2016  . Left flaccid hemiparesis (HCC)   . Muscle spasticity   . Poor appetite   . Hyperlipidemia 01/14/2016  . Depression 01/14/2016  . Obesity 01/14/2016  . Hyperglycemia 01/14/2016  . Urinary tract infection, site not specified 01/14/2016  . Basal ganglia hemorrhage (HCC) 01/14/2016  . Hemiparesis affecting nondominant side as late effect of cerebrovascular accident (HCC)   . Cognitive deficit, post-stroke   . Left hemiparesis (HCC)   . Essential hypertension   . Dysphagia, post-stroke   . Gait disturbance, post-stroke   . Hyponatremia   . Thrombocytopenia (HCC)   . Hemorrhagic stroke (HCC) R basal ganglia d/t HTN 01/08/2016    Aveen Stansel, Radene Journey MS, CCC-SLP 04/05/2016, 5:45 PM  [facility] [address] [place], [place],  [address] Phone: [phone]   Fax:  [phone]  Name: Jon [name] MRN: [id number] Date of Birth: 12-05-1955

## 2016-04-06 ENCOUNTER — Ambulatory Visit: Payer: 59 | Admitting: Physical Therapy

## 2016-04-06 ENCOUNTER — Encounter: Payer: Self-pay | Admitting: Occupational Therapy

## 2016-04-06 ENCOUNTER — Ambulatory Visit: Payer: 59 | Admitting: Occupational Therapy

## 2016-04-06 ENCOUNTER — Ambulatory Visit: Payer: 59

## 2016-04-06 DIAGNOSIS — M6281 Muscle weakness (generalized): Secondary | ICD-10-CM

## 2016-04-06 DIAGNOSIS — I69318 Other symptoms and signs involving cognitive functions following cerebral infarction: Secondary | ICD-10-CM

## 2016-04-06 DIAGNOSIS — I69352 Hemiplegia and hemiparesis following cerebral infarction affecting left dominant side: Secondary | ICD-10-CM | POA: Diagnosis not present

## 2016-04-06 DIAGNOSIS — R41841 Cognitive communication deficit: Secondary | ICD-10-CM

## 2016-04-06 DIAGNOSIS — R278 Other lack of coordination: Secondary | ICD-10-CM

## 2016-04-06 NOTE — Therapy (Signed)
[facility] [address] [place], [place], [address] Phone: [phone]   Fax:  [phone]  Physical Therapy Treatment  Patient Details  Name: Jon [name] MRN: [id number] Date of Birth: January 25, 1956 Referring Provider: Dr. Wynn Banker  Encounter Date: 04/06/2016      PT End of Session - 04/06/16 1254    Visit Number 2   Number of Visits 17   Date for PT Re-Evaluation 06/04/16   Authorization Type UHC and Medicare? G-CODE AND PROGRESS NOTE EVERY 10TH VISIT.    PT Start Time 1153   PT Stop Time 1237   PT Time Calculation (min) 44 min   Equipment Utilized During Treatment Gait belt   Activity Tolerance Patient limited by fatigue  Seated rest break required after gait x75'   Behavior During Therapy WFL for tasks assessed/performed      Past Medical History:  Diagnosis Date  . Hypertension   . Liver damage     Past Surgical History:  Procedure Laterality Date  . TONSILLECTOMY AND ADENOIDECTOMY Bilateral    Age 60    There were no vitals filed for this visit.      Subjective Assessment - 04/06/16 1155    Subjective Pt reports increased pain in R 1st digit (points to PIP) with prolonged walking with hemi walker. No pain at this time.   Patient is accompained by: Family member  wife, [name]   Pertinent History Pt goes by Jon [name]".   PMH significant for: HTN, ETOH abuse per pt's wife but pt has not consumed alcohol since ICH, liver damage   Patient Stated Goals Control ankle movement better and improve placement of the foot.    Currently in Pain? No/denies            [facility] PT Assessment - 04/06/16 0001      Standardized Balance Assessment   Standardized Balance Assessment Berg Balance Test     Berg Balance Test   Sit to Stand Able to stand using hands after several tries   Standing Unsupported Able to stand 30 seconds unsupported  onset of LOB to R after ~40 sec   Sitting with Back Unsupported but  Feet Supported on Floor or Stool Able to sit safely and securely 2 minutes   Stand to Sit Needs assistance to sit   Transfers Needs one person to assist   Standing Unsupported with Eyes Closed Needs help to keep from falling  Reports being "real dizzy" with EC   Standing Ubsupported with Feet Together Needs help to attain position and unable to hold for 15 seconds  posterior LOB when attempted   From Standing, Reach Forward with Outstretched Arm Loses balance while trying/requires external support   From Standing Position, Pick up Object from Floor Unable to try/needs assist to keep balance   From Standing Position, Turn to Look Behind Over each Shoulder Needs assist to keep from losing balance and falling   Turn 360 Degrees Needs assistance while turning   Standing Unsupported, Alternately Place Feet on Step/Stool Needs assistance to keep from falling or unable to try   Standing Unsupported, One Foot in Front Loses balance while stepping or standing   Standing on One Leg Unable to try or needs assist to prevent fall   Total Score 9                     [facility] Adult PT Treatment/Exercise - 04/06/16 1246      Transfers  Transfers Sit to Stand;Stand to Sit   Sit to Stand 4: Min guard;4: Min assist   Sit to Stand Details (indicate cue type and reason) cueing for LLE placement, safe RUE hand placement, and symmetrical LE WB   Stand to Sit 4: Min guard   Stand to Sit Details cueing for symmetrical LE placement and WB, for safe RUE placement     Ambulation/Gait   Ambulation/Gait Yes   Ambulation/Gait Assistance 4: Min guard;4: Min assist   Ambulation/Gait Assistance Details With AFO on LLE, pt performed gait x30' with hemi walker with significant lateral trunk lean to R side, minimal lateral weight shift to L. Performed gait x75' with RW and L h/o with improved midline orientation and increased lateral weight shift to L side; required cueing to address posterior rotation of trunk  on L, and to prevent trunk retropulsion and L hip hike to achieve LLE advancement/clearance.   Ambulation Distance (Feet) 105 Feet  total   Assistive device Hemi-walker;Rolling walker;Other (Comment)  L hand orthosis; L AFO   Gait Pattern Step-through pattern;Decreased stride length;Decreased dorsiflexion - left;Decreased hip/knee flexion - left;Decreased weight shift to left;Left circumduction;Decreased stance time - left;Left flexed knee in stance;Lateral trunk lean to right;Trunk rotated posteriorly on left;Narrow base of support;Poor foot clearance - left  L foot ER   Ambulation Surface Level;Indoor                PT Education - 04/06/16 1243    Education provided Yes   Education Details Sharlene Motts findings, functional implications (emphasis on fall risk). Explained positive impact of WB on promoting muscle activation, functional return/use on hemiparetic side after CVA.    Person(s) Educated Patient;Spouse   Methods Explanation;Demonstration;Verbal cues;Handout   Comprehension Verbalized understanding;Returned demonstration          PT Short Term Goals - 04/06/16 1256      PT SHORT TERM GOAL #1   Title Pt will verbalize understanding of risk factors and signs/sx's of CVA in order to reduce risk of addtional CVA. TARGET DATE FOR ALL STGS: 05/03/16   Status On-going     PT SHORT TERM GOAL #2   Title Perform BERG and write goal prn.    Baseline 9/13: Berg score = 9/56   Status Achieved     PT SHORT TERM GOAL #3   Title Pt will amb. 200' with LRAD and S over even terrain to improve functional mobility.    Status On-going     PT SHORT TERM GOAL #4   Title Pt will improve gait speed with LRAD to >/=0.16ft/sec. to improve functional mobility.    Status On-going     PT SHORT TERM GOAL #5   Title Pt will improve Berg score from 9/56 to 13/56 to indicate improved standing balance.    Status New           PT Long Term Goals - 04/06/16 1303      PT LONG TERM GOAL #1    Title Pt will be IND in HEP to improve flexibility, strength, and balance. TARGET DATE FOR ALL LTGS: 05/31/16   Status On-going     PT LONG TERM GOAL #2   Title Pt will improve gait speed to >/=1.17ft/sec with LRAD to decr. falls risk.    Status On-going     PT LONG TERM GOAL #3   Title Pt will amb. 500' over even and paved surfaces at with LRAD at MOD I level to improve functional mobility.  Status On-going     PT LONG TERM GOAL #4   Title Pt will report zero falls over the last 4 weeks to improve safety during functional mobility.    Status On-going     PT LONG TERM GOAL #5   Title Pt will improve Berg score from 9/56 to 17/56 to indicate improved standing balance.    Status New               Plan - 04/06/16 1255    Clinical Impression Statement Session focused on completing Berg to assess/address functional standing balance. Pt scored 9/56 on Berg, suggesting significant fall risk. Added short and long term goals for Berg score. Remainder of session focused on gait training with emphasis on decreasing compensatory movement patterns.  Pt tolerated interventions well, but did require seated rest breaks due to fatigue.   Rehab Potential Good   Clinical Impairments Affecting Rehab Potential co-morbidities   PT Frequency 2x / week   PT Duration 8 weeks   PT Treatment/Interventions ADLs/Self Care Home Management;Biofeedback;Canalith Repostioning;Electrical Stimulation;Cognitive remediation;Neuromuscular re-education;Balance training;Therapeutic exercise;Therapeutic activities;Functional mobility training;Stair training;Gait training;DME Instruction;Orthotic Fit/Training;Patient/family education;Wheelchair mobility training;Manual techniques;Vestibular   PT Next Visit Plan CVA ed. Intiate strengthening and balance HEP. Consider modifying assistive device to RW with L hand orthosis (if pt/wife amenable) to normalize postural alignment and gait pattern, decrease spasticity, increase  LUE/LLE WB during ambulation.   Consulted and Agree with Plan of Care Patient;Family member/caregiver   Family Member Consulted wife-[name]      Patient will benefit from skilled therapeutic intervention in order to improve the following deficits and impairments:  Abnormal gait, Decreased endurance, Impaired sensation, Decreased knowledge of use of DME, Decreased strength, Impaired UE functional use, Decreased mobility, Decreased balance, Decreased range of motion, Decreased cognition, Impaired flexibility, Postural dysfunction  Visit Diagnosis: Hemiplegia and hemiparesis following cerebral infarction affecting left dominant side (HCC)  Muscle weakness (generalized)  Other lack of coordination     Problem List Patient Active Problem List   Diagnosis Date Noted  . Alcohol use disorder, severe, dependence (HCC) 02/15/2016  . Muscle contusion 02/11/2016  . Left flaccid hemiparesis (HCC)   . Muscle spasticity   . Poor appetite   . Hyperlipidemia 01/14/2016  . Depression 01/14/2016  . Obesity 01/14/2016  . Hyperglycemia 01/14/2016  . Urinary tract infection, site not specified 01/14/2016  . Basal ganglia hemorrhage (HCC) 01/14/2016  . Hemiparesis affecting nondominant side as late effect of cerebrovascular accident (HCC)   . Cognitive deficit, post-stroke   . Left hemiparesis (HCC)   . Essential hypertension   . Dysphagia, post-stroke   . Gait disturbance, post-stroke   . Hyponatremia   . Thrombocytopenia (HCC)   . Hemorrhagic stroke (HCC) R basal ganglia d/t HTN 01/08/2016    Jorje GuildBlair Hadlie Gipson, PT, DPT [facility] [address] [place], [place], [address] Phone: [phone]   Fax:  [phone] 04/06/16, 1:09 PM  Name: Jon CoolerGeorge D [name] MRN: [id number] Date of Birth: September 06, 1955

## 2016-04-06 NOTE — Therapy (Signed)
[facility] [address] [place], [place], [address] Phone: [phone]   Fax:  [phone]  Speech Language Pathology Treatment  Patient Details  Name: Jon [name] MRN: [id number] Date of Birth: July 11, 1956 Referring Provider: Dr. Claudette LawsAndrew Kirsteins   Encounter Date: 04/06/2016      End of Session - 04/06/16 1639    Visit Number 2   Number of Visits 17   Date for SLP Re-Evaluation 05/31/16   Authorization - Visit Number 2   Authorization - Number of Visits 20   SLP Start Time 1537   SLP Stop Time  1619   SLP Time Calculation (min) 42 min      Past Medical History:  Diagnosis Date  . Hypertension   . Liver damage     Past Surgical History:  Procedure Laterality Date  . TONSILLECTOMY AND ADENOIDECTOMY Bilateral    Age 60    There were no vitals filed for this visit.      Subjective Assessment - 04/06/16 1548    Subjective "I'm just not an eye contact person."   Currently in Pain? No/denies           SLP Evaluation [facility] - 04/05/16 0948      SLP Visit Information   SLP Received On 04/05/16   Referring Provider Dr. Claudette LawsAndrew Kirsteins    Onset Date 02/07/16   Medical Diagnosis [facility]     Subjective   Subjective "My thinking is a little slow - if something is 10 steps, I can only get to 8"   Patient/Family Stated Goal To get my math back     Pain Assessment   Currently in Pain? No/denies     General Information   HPI Pt reports that in June 2017 he noticed a sharp pain over his right eye and "got up to blow my nose and I fell went right to the ground"  Pt was dx w/ CVA with Left hemiparesis. 6/16-6/22/17 Hospital admission; 01/14/16-02/04/16 [facility] .   Mobility Status arrived in wheelchair - receiving PT     Prior Functional Status   Cognitive/Linguistic Baseline Within functional limits   Type of Home House    Lives With Spouse   Available Support Family   Vocation On disability     Cognition   Overall Cognitive Status Within Functional Limits for tasks assessed   Area of Impairment Memory;Problem solving   Memory Decreased recall of precautions;Decreased short-term memory   Attention Selective;Alternating   Selective Attention Impaired   Memory Impaired   Memory Impairment Retrieval deficit;Decreased recall of new information   Awareness Impaired   Awareness Impairment Intellectual impairment   Problem Solving Impaired   Problem Solving Impairment Verbal basic;Functional basic   Executive Function Reasoning;Organizing;Decision Making;Self Monitoring   Reasoning Impaired   Organizing Impaired   Decision Making Impaired   Self Monitoring Impaired   Behaviors Verbal agitation     Visual Recognition/Discrimination   Discrimination Within Function Limits     Verbal Expression   Overall Verbal Expression Appears within functional limits for tasks assessed            ADULT SLP TREATMENT - 04/06/16 1551      General Information   Behavior/Cognition Cooperative;Pleasant mood   Patient Positioning Upright in chair     Treatment Provided   Treatment provided Cognitive-Linquistic     Pain Assessment   Pain Assessment No/denies pain     Cognitive-Linquistic Treatment  Treatment focused on Cognition   Skilled Treatment SLP engaged pt in conversation to cue for topic maintenance. Pt noted to switch topics abruptly and often. SLP facilitated pt's selective attention with a simple written task and distraction of radio. Pt complained about having to write, and completed task with rare min A back to task. SLP needed to cue pt for intellectual awareness with occasional min-mod A for deficit areas.     Assessment / Recommendations / Plan   Plan Continue with current plan of care     Progression Toward Goals   Progression toward goals Progressing toward goals          SLP Education - 04/05/16 1229    Education provided Yes    Education Details areas of impairment, goals for ST, cognitive activities he can do at home, binder for tx exercises/info   Person(s) Educated Patient;Spouse   Methods Explanation;Demonstration;Handout;Verbal cues   Comprehension Verbalized understanding;Need further instruction;Verbal cues required          SLP Short Term Goals - 04/06/16 1547      SLP SHORT TERM GOAL #1   Title Pt will selectively attend to details on mildly complex cognitive linguistic task with distractions and 85% accuracy.   Time 4   Period Weeks   Status On-going     SLP SHORT TERM GOAL #2   Title Pt will perform mildy complex organization, reasoning and time/money problems with 85% accuracy and rare min A   Time 4   Period Weeks   Status On-going     SLP SHORT TERM GOAL #3   Title Pt  will utlize external memory aids/book to recall tasks, errands, lists, and reduce repetitive question asking/frustration over 3 sessions with occasional min A.   Time 4   Period Weeks   Status On-going     SLP SHORT TERM GOAL #4   Title Pt will face and maintain eye contact conversation partner on his left side with rare min A for 5 minutes conversation.    Time 4   Period Weeks   Status On-going          SLP Long Term Goals - 04/06/16 1547      SLP LONG TERM GOAL #1   Title Pt will alterate attention between 2 mildly complex cognitive linguistic tasks with 85% on each and occasional min A   Time 8   Period Weeks   Status On-going     SLP LONG TERM GOAL #2   Title Pt will solve moderately complex reasoning, functional math and organization problems with 90% accuracy and occasional min A   Time 8   Period Weeks   Status On-going     SLP LONG TERM GOAL #3   Title Pt will utilize external aids for medication management with rare min A over 3 sessions.   Time 8   Period Weeks   Status On-going          Plan - 04/06/16 1639    Clinical Impression Statement Pt presents today with demonstrated deficits in  attention, lt inattention, and awareness. Skilled ST remains necessary to maximize pt independence and decr caregiver burden.   Speech Therapy Frequency 2x / week   Duration --  8 weeks   Treatment/Interventions Compensatory strategies;Functional tasks;Patient/family education;Cueing hierarchy;Cognitive reorganization;Internal/external aids;SLP instruction and feedback   Potential to Achieve Goals Good   Potential Considerations Cooperation/participation level      Patient will benefit from skilled therapeutic intervention in order to improve  the following deficits and impairments:   Cognitive communication deficit    Problem List Patient Active Problem List   Diagnosis Date Noted  . Alcohol use disorder, severe, dependence (HCC) 02/15/2016  . Muscle contusion 02/11/2016  . Left flaccid hemiparesis (HCC)   . Muscle spasticity   . Poor appetite   . Hyperlipidemia 01/14/2016  . Depression 01/14/2016  . Obesity 01/14/2016  . Hyperglycemia 01/14/2016  . Urinary tract infection, site not specified 01/14/2016  . Basal ganglia hemorrhage (HCC) 01/14/2016  . Hemiparesis affecting nondominant side as late effect of cerebrovascular accident (HCC)   . Cognitive deficit, post-stroke   . Left hemiparesis (HCC)   . Essential hypertension   . Dysphagia, post-stroke   . Gait disturbance, post-stroke   . Hyponatremia   . Thrombocytopenia (HCC)   . Hemorrhagic stroke (HCC) R basal ganglia d/t HTN 01/08/2016    [facility] ,MS, CCC-SLP  04/06/2016, 4:43 PM  [facility] [address] [place], [place], [address] Phone: [phone]   Fax:  [phone]   Name: Jon [name] MRN: [id number] Date of Birth: 1955-09-18

## 2016-04-06 NOTE — Patient Instructions (Signed)
  Please complete the assigned speech therapy homework and return it to your next session.  

## 2016-04-06 NOTE — Therapy (Signed)
[facility] [address] [place], [place], [address] Phone: [phone]   Fax:  [phone]  Occupational Therapy Treatment  Patient Details  Name: Jon [name] MRN: [id number] Date of Birth: May 10, 1956 Referring Provider: Dr Claudette LawsAndrew Kirsteins  Encounter Date: 04/06/2016      OT End of Session - 04/06/16 1217    Visit Number 2   Date for OT Re-Evaluation 05/30/16   Authorization Type [employer]) Front office sent email to Conan BowensLisa Long to check authorization   OT Start Time 1104   OT Stop Time 1150   OT Time Calculation (min) 46 min   Activity Tolerance Patient tolerated treatment well;No increased pain   Behavior During Therapy WFL for tasks assessed/performed      Past Medical History:  Diagnosis Date  . Hypertension   . Liver damage     Past Surgical History:  Procedure Laterality Date  . TONSILLECTOMY AND ADENOIDECTOMY Bilateral    Age 60    There were no vitals filed for this visit.      Subjective Assessment - 04/06/16 1151    Subjective  Pt reports that he hasn't been performing LUE PROM ex's or AAROM shoulder ex's "B/c it's (shoulder) tight and it hurts if I move it, or if I move it too fast." Pt and spouse were educated verbally that consistent gentle PROM/HEP LUE should assist with increasing PROM and decreasing pain which should in turn assist with increased independence for 1 handed dressing and ADL's.   Patient is accompained by: Family member  Spouse   Patient Stated Goals Get my left arm going again and have more control of my left foot.   Currently in Pain? No/denies   Multiple Pain Sites No                      OT Treatments/Exercises (OP) - 04/06/16 0001      Transfers   Transfers Sit to Stand;Stand to Sit   Sit to Stand 4: Min assist;With upper extremity assist;From chair/3-in-1   Sit to Stand Details (indicate cue type and reason) Cues for weight shifting    Stand to Sit 4: Min guard;With upper extremity assist;To chair/3-in-1     ADLs   ADL Comments Pt/spouse were educated in positioning of LUE during ADL's, functional transfers and while sleeping secondary to L neglect.     Exercises   Exercises Shoulder;Elbow;Wrist;Hand;Neurological Re-education     Shoulder Exercises: Supine   Horizontal ABduction PROM;Left;10 reps  Limited by pain; Deltoid   External Rotation PROM;Left;10 reps  Limited by pain with little movement, RTC   Internal Rotation PROM;Left;10 reps;Limitations  Limited by pain at RTC   Flexion PROM;Left;15 reps;Limitations  Initially ~50*, after ther ex, ~100* Stressed importance HEP     Shoulder Exercises: Seated   Elevation AAROM;Left;10 reps  Shoulder shruggs; therapist support at elbow   Retraction AAROM;Left;10 reps  Therapist support to LUE/at elbow   Protraction AAROM;Left;5 reps  Therapist support at elbow     Elbow Exercises   Other elbow exercises Gentle PROM for L elbow flexion, extension, supination/pronation x10 reps each     Wrist Exercises   Other wrist exercises Gentle PROM L wrist flexion/etension x10 reps hold x5 seconds each     Hand Exercises   Other Hand Exercises Gentle PROM L for flexion and extension of digits. Pt is limited by joint stiffness and edema left hand throughout. Pt/spouse were educated in positioning  of LUE as pt is noted to left arm hang down at his side; left neglect ("It got cought in my wheelchair yesterday" and he presents w/o LUE on arm rest during treatment sessions.                OT Education - 04/06/16 1216    Education provided Yes   Education Details HEP, Gentle PROM and self ROM LUE; edema control and positioning LUE during ADL's/transfers and sleep   Person(s) Educated Patient;Spouse   Methods Explanation;Demonstration;Verbal cues;Tactile cues   Comprehension Need further instruction;Verbalized understanding          OT Short Term Goals - 04/04/16  1306      OT SHORT TERM GOAL #1   Title Pt will I state precautions/signs and symptoms of CVA   Baseline VC's   Time 4   Period Weeks   Status New     OT SHORT TERM GOAL #2   Title Pt will demonstrate Mod I HEP for LUE self ROM    Baseline VC's and tc's   Time 4   Period Weeks   Status New     OT SHORT TERM GOAL #3   Title Pt will I state 2-3 edema control techniques LUE    Baseline VC's/Min A   Time 4   Period Weeks   Status New           OT Long Term Goals - 04/04/16 1307      OT LONG TERM GOAL #1   Title Pt will be Mod I upgrade HEP LUE   Time 8   Period Weeks   Status New     OT LONG TERM GOAL #2   Title Pt will be Mod I simple snack prep in ADL kitchen (using manual w/c vs hemi walker).   Time 8   Period Weeks   Status New     OT LONG TERM GOAL #3   Title Pt will be Mod I basic ADL transfers using hemi walker    Time 8   Period Weeks   Status New     OT LONG TERM GOAL #4   Title Pt will be supervision LB dressing w/ a/e or DME PRN, using 1 handed technique   Time 8   Period Weeks   Status New     OT LONG TERM GOAL #5   Title Pt will be supervision level simulated bathing UB/LB using 1 handed technique   Time 8   Period Weeks   Status New               Plan - 04/06/16 1218    Clinical Impression Statement Pt should benefit from performance of HEP and improved positioning of LUE during ADL's, functional transfers and when sleeping. Pt and spouse were educated verbally that consistent gentle PROM/HEP LUE should assist with increasing PROM and decreasing pain which should in turn assist with increased independence for 1 handed dressing and ADL's.   Rehab Potential Fair   OT Frequency 2x / week   OT Duration 8 weeks   OT Treatment/Interventions Self-care/ADL training;Therapeutic exercise;DME and/or AE instruction;Building services engineer;Therapeutic activities;Patient/family education;Splinting;Neuromuscular education;Electrical  Stimulation;Passive range of motion;Therapeutic exercises;Visual/perceptual remediation/compensation   Plan Review LUE HEP/seated hemi ex's/supine ex's; consider 1 handed dressing techniques.   Consulted and Agree with Plan of Care Patient;Family member/caregiver   Family Member Consulted Spouse      Patient will benefit from skilled therapeutic intervention in order to improve the following  deficits and impairments:  Abnormal gait, Decreased balance, Decreased endurance, Difficulty walking, Impaired sensation, Impaired vision/preception, Pain, Increased edema, Decreased range of motion, Decreased cognition, Decreased activity tolerance, Decreased coordination, Decreased knowledge of use of DME, Decreased safety awareness, Decreased strength, Impaired UE functional use  Visit Diagnosis: Hemiplegia and hemiparesis following cerebral infarction affecting left dominant side (HCC)  Muscle weakness (generalized)  Other symptoms and signs involving cognitive functions following cerebral infarction  Other lack of coordination    Problem List Patient Active Problem List   Diagnosis Date Noted  . Alcohol use disorder, severe, dependence (HCC) 02/15/2016  . Muscle contusion 02/11/2016  . Left flaccid hemiparesis (HCC)   . Muscle spasticity   . Poor appetite   . Hyperlipidemia 01/14/2016  . Depression 01/14/2016  . Obesity 01/14/2016  . Hyperglycemia 01/14/2016  . Urinary tract infection, site not specified 01/14/2016  . Basal ganglia hemorrhage (HCC) 01/14/2016  . Hemiparesis affecting nondominant side as late effect of cerebrovascular accident (HCC)   . Cognitive deficit, post-stroke   . Left hemiparesis (HCC)   . Essential hypertension   . Dysphagia, post-stroke   . Gait disturbance, post-stroke   . Hyponatremia   . Thrombocytopenia (HCC)   . Hemorrhagic stroke (HCC) R basal ganglia d/t HTN 01/08/2016    Shamaine Mulkern Dionicio Stall, OTR/L 04/06/2016, 12:22 PM  [facility] [address] [place], [place], [address] Phone: [phone]   Fax:  [phone]  Name: Jon [name] MRN: [id number] Date of Birth: 02-14-1956

## 2016-04-11 ENCOUNTER — Ambulatory Visit (INDEPENDENT_AMBULATORY_CARE_PROVIDER_SITE_OTHER): Payer: 59 | Admitting: Neurology

## 2016-04-11 ENCOUNTER — Encounter: Payer: Self-pay | Admitting: Neurology

## 2016-04-11 VITALS — BP 95/62 | HR 62 | Ht 71.0 in

## 2016-04-11 DIAGNOSIS — I10 Essential (primary) hypertension: Secondary | ICD-10-CM | POA: Diagnosis not present

## 2016-04-11 DIAGNOSIS — E785 Hyperlipidemia, unspecified: Secondary | ICD-10-CM | POA: Diagnosis not present

## 2016-04-11 DIAGNOSIS — G8194 Hemiplegia, unspecified affecting left nondominant side: Secondary | ICD-10-CM | POA: Diagnosis not present

## 2016-04-11 DIAGNOSIS — F329 Major depressive disorder, single episode, unspecified: Secondary | ICD-10-CM

## 2016-04-11 DIAGNOSIS — F32A Depression, unspecified: Secondary | ICD-10-CM

## 2016-04-11 DIAGNOSIS — I619 Nontraumatic intracerebral hemorrhage, unspecified: Secondary | ICD-10-CM

## 2016-04-11 NOTE — Patient Instructions (Addendum)
-   will do MRI and MRA to rule out structural lesion and vessel malformation - will consider ASA if MRI negative for structural lesion and vessel malformation.  - continue aggressive physical therapy - continue baclofen for spasticity - Follow up with your primary care physician for stroke risk factor modification. Recommend maintain blood pressure goal <130/80, diabetes with hemoglobin A1c goal below 7.0% and lipids with LDL cholesterol goal below 70 mg/dL.  - check BP at home and record - continue zoloft.  - follow up in 3 months.

## 2016-04-12 ENCOUNTER — Encounter: Payer: Self-pay | Admitting: Physical Therapy

## 2016-04-12 ENCOUNTER — Ambulatory Visit: Payer: 59 | Admitting: Physical Therapy

## 2016-04-12 ENCOUNTER — Ambulatory Visit: Payer: 59 | Admitting: *Deleted

## 2016-04-12 DIAGNOSIS — M6281 Muscle weakness (generalized): Secondary | ICD-10-CM

## 2016-04-12 DIAGNOSIS — R278 Other lack of coordination: Secondary | ICD-10-CM

## 2016-04-12 DIAGNOSIS — I69352 Hemiplegia and hemiparesis following cerebral infarction affecting left dominant side: Secondary | ICD-10-CM | POA: Diagnosis not present

## 2016-04-12 DIAGNOSIS — R41841 Cognitive communication deficit: Secondary | ICD-10-CM

## 2016-04-12 NOTE — Therapy (Signed)
[facility] [address] [place], [place], [address] Phone: [phone]   Fax:  [phone]  Speech Language Pathology Treatment  Patient Details  Name: Jon CoolerGeorge D Grunewald MRN: [id number] Date of Birth: Apr 02, 1956 Referring Provider: Dr. Claudette LawsAndrew Kirsteins   Encounter Date: 04/12/2016      End of Session - 04/12/16 1631    Visit Number 3   Number of Visits 17   Date for SLP Re-Evaluation 05/31/16   Activity Tolerance Patient limited by fatigue  Seated rest break required after gait x75'      Past Medical History:  Diagnosis Date  . Hypertension   . Liver damage   . Stroke [facility](HCC)     Past Surgical History:  Procedure Laterality Date  . TONSILLECTOMY AND ADENOIDECTOMY Bilateral    Age 60    There were no vitals filed for this visit.      Subjective Assessment - 04/12/16 1630    Subjective "I had ADD before my stroke"               ADULT SLP TREATMENT - 04/12/16 0001      General Information   Behavior/Cognition Cooperative;Pleasant mood     Treatment Provided   Treatment provided Cognitive-Linquistic     Pain Assessment   Pain Assessment No/denies pain     Cognitive-Linquistic Treatment   Treatment focused on Cognition   Skilled Treatment SLP engaged pt in conversation regarding new therapist and hx prior to CVA and beyond for topic maintenance. Pt with limited eye contact initially, but improved as session progressed; topic maintenance required minimal verbal cues today as topic related to pt interest (work history).  When topic was unfamiliar or uninteresting to pt, he would abruptly change topic or answer with shorter responses or even veer from topic.  SLP facilitated pt's selective attention with a simple written task and distraction of open door to clinic. Pt given opportunity to give SLP answer verbally, but attend to category plus functional grocery list with min cues to look to left and  finish list in a timely manner. SLP needed to cue pt for intellectual awareness with occasional min-mod A for deficit areas. Pt stated his ADD prior to CVA is more affected now which may indicate increased awareness of deficits at this time.     Assessment / Recommendations / Plan   Plan Continue with current plan of care     Progression Toward Goals   Progression toward goals Progressing toward goals            SLP Short Term Goals - 04/12/16 1632      SLP SHORT TERM GOAL #1   Title Pt will selectively attend to details on mildly complex cognitive linguistic task with distractions and 85% accuracy.   Time 3   Period Weeks   Status On-going     SLP SHORT TERM GOAL #2   Title Pt will perform mildy complex organization, reasoning and time/money problems with 85% accuracy and rare min A   Time 3   Period Weeks   Status On-going     SLP SHORT TERM GOAL #3   Title Pt  will utlize external memory aids/book to recall tasks, errands, lists, and reduce repetitive question asking/frustration over 3 sessions with occasional min A.   Time 3   Period Weeks   Status On-going     SLP SHORT TERM GOAL #4   Title Pt will face and maintain eye contact conversation partner  on his left side with rare min A for 5 minutes conversation.    Time 3   Period Weeks   Status On-going          SLP Long Term Goals - 04/12/16 1633      SLP LONG TERM GOAL #1   Title Pt will alterate attention between 2 mildly complex cognitive linguistic tasks with 85% on each and occasional min A   Time 7   Period Weeks   Status On-going     SLP LONG TERM GOAL #2   Title Pt will solve moderately complex reasoning, functional math and organization problems with 90% accuracy and occasional min A   Time 7   Period Weeks   Status On-going     SLP LONG TERM GOAL #3   Title Pt will utilize external aids for medication management with rare min A over 3 sessions.   Time 7   Period Weeks   Status On-going           Plan - 04/12/16 1632    Clinical Impression Statement Pt presents today with demonstrated deficits in attention,  inattention, and awareness. Skilled ST remains necessary to maximize pt independence and decrease caregiver burden.   Speech Therapy Frequency 2x / week   Duration --  8 weeks   Treatment/Interventions Compensatory strategies;Functional tasks;Patient/family education;Cueing hierarchy;Cognitive reorganization;Internal/external aids;SLP instruction and feedback   Potential to Achieve Goals Good   Potential Considerations Cooperation/participation level      Patient will benefit from skilled therapeutic intervention in order to improve the following deficits and impairments:   Cognitive communication deficit    Problem List Patient Active Problem List   Diagnosis Date Noted  . Alcohol use disorder, severe, dependence (HCC) 02/15/2016  . Muscle contusion 02/11/2016  . Left flaccid hemiparesis (HCC)   . Muscle spasticity   . Poor appetite   . Hyperlipidemia 01/14/2016  . Depression 01/14/2016  . Obesity 01/14/2016  . Hyperglycemia 01/14/2016  . Urinary tract infection, site not specified 01/14/2016  . Basal ganglia hemorrhage (HCC) 01/14/2016  . Hemiparesis affecting nondominant side as late effect of cerebrovascular accident (HCC)   . Cognitive deficit, post-stroke   . Left hemiparesis (HCC)   . Essential hypertension   . Dysphagia, post-stroke   . Gait disturbance, post-stroke   . Hyponatremia   . Thrombocytopenia (HCC)   . Hemorrhagic stroke (HCC) R basal ganglia d/t HTN 01/08/2016    Amoni Scallan,PAT, M.S., CCC-SLP 04/12/2016, 4:34 PM  [facility] [address] [place], [place], [address] Phone: [phone]   Fax:  [phone]   Name: Jon [name] MRN: [id number] Date of Birth: 1956/01/27

## 2016-04-12 NOTE — Progress Notes (Signed)
STROKE NEUROLOGY FOLLOW UP NOTE  NAME: Jon [name] DOB: Apr 15, 1956  REASON FOR VISIT: stroke follow up HISTORY FROM: pt and chart  Today we had the pleasure of seeing Jon [name] in follow-up at our Neurology Clinic. Pt was accompanied by wife.   History Summary Jon [name] is a 60 y.o. male with history of HTN, HLD, and alcohol abuse admitted no 01/08/16 for severe headache, left-sided weakness, slurred speech, and elevated blood pressure. CT showed a R basal ganglia hemorrhage likely due to HTN. CTA head and neck showed no AVM or aneurysm. EF 60-65%. LDL 157 and A1C 5.5. Resumed home metoprolol but added amlodipine for BP control. Also continued home pravastatin for HLD. Added zoloft for depression. He was discharged to CIR after stabilization but still has left hemiplegia.   Interval History During the interval time, the patient has been doing well. Released from CIR and now at home undergoing outpt PT/OT/speech. Still has left sided weakness, but left leg able to raise up but left arm still not able to move. BP at home 115/70, today in clinic 95/62. Reported occular migraine with rainbow colored imaging at right eye lasting . No associated HA. It happened 2 times for the last 3 months.   REVIEW OF SYSTEMS: Full 14 system review of systems performed and notable only for those listed below and in HPI above, all others are negative:  Constitutional:   Cardiovascular: palpitations Ear/Nose/Throat:  Runny nose, drooling Skin:  Eyes:   Respiratory:   Gastroitestinal:   Genitourinary: difficulty urinating, painful urination Hematology/Lymphatic:   Endocrine:  Musculoskeletal:  Joint pain, aching muscles, walking difficulty Allergy/Immunology:  Food allergy Neurological:  Memory loss, numbness, weakness, facial drooping Psychiatric:  Sleep:   The following represents the patient's updated allergies and side effects list: Allergies  Allergen Reactions  . Gluten  Meal Nausea And Vomiting  . Lactose Intolerance (Gi) Nausea And Vomiting  . Other     Tree nuts   . Shellfish Allergy Swelling and Rash    The neurologically relevant items on the patient's problem list were reviewed on today's visit.  Neurologic Examination  A problem focused neurological exam (12 or more points of the single system neurologic examination, vital signs counts as 1 point, cranial nerves count for 8 points) was performed.  Blood pressure 95/62, pulse 62, height 5\' 11"  (1.803 m).  General - Well nourished, well developed, in no apparent distress.  Ophthalmologic - Fundi not visualized due to eye movement.  Cardiovascular - Regular rate and rhythm with no murmur.  Mental Status -  Level of arousal and orientation to time, place, and person were intact. Language including expression, naming, repetition, comprehension was assessed and found intact, mild dysarthria. Fund of Knowledge was assessed and was intact.  Cranial Nerves II - XII - II - Visual field intact OU. III, IV, VI - Extraocular movements intact. V - Facial sensation intact bilaterally. VII - left facial droop VIII - Hearing & vestibular intact bilaterally. X - Palate elevates symmetrically. XI - Chin turning & shoulder shrug intact bilaterally. XII - Tongue protrusion intact.  Motor Strength - The patient's strength was 0/5 LUE, 3/5 LLE, left shoulder pain and limited ROM, 5/5 RUE and RLE.  Bulk was normal and fasciculations were absent.   Motor Tone - Muscle tone was assessed at the neck and appendages and was increased at LLE.  Reflexes - The patient's reflexes were 1+ in all extremities except 3+ LLE and he  had left babainski with unsustained ankle clonus on the left.  Sensory - Light touch, temperature/pinprick were assessed and were symmetric.    Coordination - The patient had normal movements in the right hand with no ataxia or dysmetria.  Tremor was absent.  Gait and Station - in  wheelchair.   Functional score  mRS = 4   0 - No symptoms.   1 - No significant disability. Able to carry out all usual activities, despite some symptoms.   2 - Slight disability. Able to look after own affairs without assistance, but unable to carry out all previous activities.   3 - Moderate disability. Requires some help, but able to walk unassisted.   4 - Moderately severe disability. Unable to attend to own bodily needs without assistance, and unable to walk unassisted.   5 - Severe disability. Requires constant nursing care and attention, bedridden, incontinent.   6 - Dead.   NIH Stroke Scale   Level Of Consciousness 0=Alert; keenly responsive 1=Not alert, but arousable by minor stimulation 2=Not alert, requires repeated stimulation 3=Responds only with reflex movements 0  LOC Questions to Month and Age 73=Answers both questions correctly 1=Answers one question correctly 2=Answers neither question correctly 0  LOC Commands      -Open/Close eyes     -Open/close grip 0=Performs both tasks correctly 1=Performs one task correctly 2=Performs neighter task correctly 0  Best Gaze 0=Normal 1=Partial gaze palsy 2=Forced deviation, or total gaze paresis 0  Visual 0=No visual loss 1=Partial hemianopia 2=Complete hemianopia 3=Bilateral hemianopia (blind including cortical blindness) 0  Facial Palsy 0=Normal symmetrical movement 1=Minor paralysis (asymmetry) 2=Partial paralysis (lower face) 3=Complete paralysis (upper and lower face) 2  Motor  0=No drift, limb holds posture for full 10 seconds 1=Drift, limb holds posture, no drift to bed 2=Some antigravity effort, cannot maintain posture, drifts to bed 3=No effort against gravity, limb falls 4=No movement Right Arm 0     Leg 0    Left Arm 4     Leg 2  Limb Ataxia 0=Absent 1=Present in one limb 2=Present in two limbs 1  Sensory 0=Normal 1=Mild to moderate sensory loss 2=Severe to total sensory loss 0  Best  Language 0=No aphasia, normal 1=Mild to moderate aphasia 2=Mute, global aphasia 3=Mute, global aphasia 0  Dysarthria 0=Normal 1=Mild to moderate 2=Severe, unintelligible or mute/anarthric 1  Extinction/Neglect 0=No abnormality 1=Extinction to bilateral simultaneous stimulation 2=Profound neglect 0  Total   10     Data reviewed: I personally reviewed the images and agree with the radiology interpretations.  Ct Head Wo Contrast 01/08/2016  Right basal ganglia hemorrhage with 31 cc volume. 3 mm midline shift.   CTA head and neck  01/09/2016 Unchanged RIGHT basal ganglia hemorrhage, favored to represent a hypertensive related bleed.  Volume today 32 mm, essentially unchanged. Mild 3 mm RIGHT-to-LEFT shift. No intracranial or extracranial stenosis, dissection, aneurysm, or vascular malformation.  TTE  01/09/2016 - Left ventricle: The cavity size was normal. Wall thickness was increased in a pattern of moderate LVH. Systolic function was normal. The estimated ejection fraction was in the range of 60% to 65%. Wall motion was normal; there were no regional wall motion abnormalities. Left ventricular diastolic function parameters were normal. - Pulmonary arteries: PA peak pressure: 52 mm Hg (S). Impressions: No cardiac source of emboli was indentified.  CXR 01/09/2016 No active disease.  Component     Latest Ref Rng & Units 01/09/2016  Cholesterol     0 - 200 mg/dL  250 (H)  Triglycerides     <150 mg/dL 161212 (H)  HDL Cholesterol     >40 mg/dL 51  Total CHOL/HDL Ratio     RATIO 4.9  VLDL     0 - 40 mg/dL 42 (H)  LDL (calc)     0 - 99 mg/dL 096157 (H)  Hemoglobin E4VA1C     4.8 - 5.6 % 5.5  Mean Plasma Glucose     mg/dL 409111  TSH     8.1190.350 - 1.4784.500 uIU/mL 0.835  Vitamin B12     180 - 914 pg/mL 449  RPR     Non Reactive Non Reactive  HIV     Non Reactive Non Reactive    Assessment: As you may recall, he is a 60 y.o. Caucasian male with PMH of HTN, HLD, and alcohol  abuse admitted no 01/08/16 for R basal ganglia hemorrhage likely due to HTN. CTA head and neck showed no AVM or aneurysm. EF 60-65%. LDL 157 and A1C 5.5. Added zoloft for depression. He was discharged to CIR after stabilization but still has left hemiplegia. Now at home undergoing outpt PT/OT/speech. Reported occular migraine with rainbow colored imaging at right eye lasting 15min. No associated HA. It happened 2 times for the last 3 months.   Plan:  - MRI and MRA to rule out structural lesion  - will consider ASA if MRI and MRA negative   - continue aggressive physical therapy - Follow up with your primary care physician for stroke risk factor modification. Recommend maintain blood pressure goal <130/80, diabetes with hemoglobin A1c goal below 7.0% and lipids with LDL cholesterol goal below 70 mg/dL.  - check BP at home and record - follow up with Dr. Wynn BankerKirsteins for rehab and left shoulder pain - continue zoloft.  - follow up in 3 months.   Orders Placed This Encounter  Procedures  . MR Brain W Wo Contrast    Standing Status:   Future    Standing Expiration Date:   06/13/2017    Order Specific Question:   Reason for Exam (SYMPTOM  OR DIAGNOSIS REQUIRED)    Answer:   ICH follow up    Order Specific Question:   Preferred imaging location?    Answer:   Internal    Order Specific Question:   Does the patient have a pacemaker or implanted devices?    Answer:   No    Order Specific Question:   What is the patient's sedation requirement?    Answer:   No Sedation  . MR MRA HEAD WO CONTRAST    Standing Status:   Future    Standing Expiration Date:   06/13/2017    Order Specific Question:   Reason for Exam (SYMPTOM  OR DIAGNOSIS REQUIRED)    Answer:   ICH follow up    Order Specific Question:   Preferred imaging location?    Answer:   Internal    Order Specific Question:   Does the patient have a pacemaker or implanted devices?    Answer:   No    Order Specific Question:   What is the  patient's sedation requirement?    Answer:   No Sedation    Meds ordered this encounter  Medications  . DISCONTD: gabapentin (NEURONTIN) 300 MG capsule    Sig: Take 300 mg by mouth 2 (two) times daily.    Refill:  1  . DISCONTD: tiZANidine (ZANAFLEX) 4 MG tablet    Sig: Take 4  mg by mouth 3 (three) times daily.    Refill:  0    Patient Instructions  - will do MRI and MRA to rule out structural lesion and vessel malformation - will consider ASA if MRI negative for structural lesion and vessel malformation.  - continue aggressive physical therapy - continue baclofen for spasticity - Follow up with your primary care physician for stroke risk factor modification. Recommend maintain blood pressure goal <130/80, diabetes with hemoglobin A1c goal below 7.0% and lipids with LDL cholesterol goal below 70 mg/dL.  - check BP at home and record - continue zoloft.  - follow up in 3 months.    Marvel Plan, MD PhD [facility] [address] [place], [place] [address] [phone]

## 2016-04-12 NOTE — Patient Instructions (Addendum)
Straight Leg Raise    Tighten stomach and slowly raise locked left leg __1-2__ inches from floor. Have Rosey Batheresa help you as needed to keep the knee straight and lower it down slowly. Repeat _10___ times per set. Do __1__ sets per session. Do __1-2__ sessions per day.  http://orth.exer.us/1102   Copyright  VHI. All rights reserved.  Bridging    Slowly raise buttocks from floor, keeping stomach tight. Hold for 3 seconds. Have Rosey Batheresa help keep left knee in place with this exercise. Repeat __10__ times per set. Do _1__ sets per session. Do _1-2_ sessions per day.  http://orth.exer.us/1096   Copyright  VHI. All rights reserved.  Bridging (Single Leg)    Lie on back with feet shoulder width apart and right leg straight, keep in on the bed/floor. Lift hips toward the ceiling while keeping leg on surface. Have Rosey Batheresa help keep your left leg in place.  Repeat __10_ times. Do __1-2__ sessions per day.  http://gt2.exer.us/358   Copyright  VHI. All rights reserved.   Bracing With Heel Slides (Supine)    With neutral spine, tighten pelvic floor and abdominals and hold. Slide left  heel to bottom. Have Rosey Batheresa support you at the knee and ankle with this exercise. Repeat _10__ times. Do _1-2__ times a day.   Copyright  VHI. All rights reserved.  ABDUCTION: Supine (Active)    Lie on back, legs straight. Slide left leg out to the side as far as possible. No weights at this time. Then bring left leg back into where you started. Have Rosey Batheresa help you keep foot pointed straight up toward ceiling. Complete __1_ sets of _10_ repetitions. Perform __1-2_ sessions per day.  Copyright  VHI. All rights reserved.

## 2016-04-13 ENCOUNTER — Telehealth: Payer: Self-pay | Admitting: Neurology

## 2016-04-13 NOTE — Telephone Encounter (Signed)
Pt's wife called in wanting to know what she needs to expect from the pt now. She states he is acting very weird and does not know what is his fault and what is not. Pt is verbally abusive . Pt is lashing out at her and he is telling her things that are not happening. He is trying to call the police because she does not care about him. With pts injury she does not know what to expect. She states she is struggling. States that if someone calls and he is there beside her she will not be able to speak. States he is being very controlling. She says she just wants to understand. Please call and advise  Wife says she has mychart access as well.

## 2016-04-13 NOTE — Telephone Encounter (Signed)
Called pt at 5:52pm and nobody picked up the call. Did not leave message in case wife will be in trouble. Will call her again later.   Jon PlanJindong Eston Heslin, MD PhD Stroke Neurology 04/13/2016 5:52 PM

## 2016-04-14 NOTE — Therapy (Signed)
[facility] [address] [place], [place], [address] Phone: [phone]   Fax:  [phone]  Physical Therapy Treatment  Patient Details  Name: Jon [name] MRN: [id number] Date of Birth: [birth year] Referring Provider: Dr. Wynn Banker  Encounter Date: 04/12/2016  04/12/16 1453  PT Visits / Re-Eval  Visit Number 3  Number of Visits 17  Date for PT Re-Evaluation 06/04/16  Authorization  Authorization Type UHC and Medicare? G-CODE AND PROGRESS NOTE EVERY 10TH VISIT.   PT Time Calculation  PT Start Time 1448  PT Stop Time 1530  PT Time Calculation (min) 42 min  PT - End of Session  Equipment Utilized During Treatment Gait belt  Activity Tolerance Patient limited by fatigue  Behavior During Therapy WFL for tasks assessed/performed     Past Medical History:  Diagnosis Date  . Hypertension   . Liver damage   . Stroke [facility])     Past Surgical History:  Procedure Laterality Date  . TONSILLECTOMY AND ADENOIDECTOMY Bilateral    Age 60    There were no vitals filed for this visit.     04/12/16 1452  Symptoms/Limitations  Subjective No new complaints. No falls. Was having some right shoulder pain, took a tylenol and it's better now.  Patient is accompained by: Family member (wife, Rosey Bath)  Pertinent History Pt goes by [employer]".   PMH significant for: HTN, ETOH abuse per pt's wife but pt has not consumed alcohol since ICH, liver damage  Patient Stated Goals Control ankle movement better and improve placement of the foot.   Pain Assessment  Currently in Pain? No/denies   Treatment: Issued the following for pt's HEP: Straight Leg Raise    Tighten stomach and slowly raise locked left leg __1-2__ inches from floor. Have Rosey Bath help you as needed to keep the knee straight and lower it down slowly. Repeat _10___ times per set. Do __1__ sets per session. Do __1-2__ sessions per day.  http://orth.exer.us/1102    Copyright  VHI. All rights reserved.  Bridging    Slowly raise buttocks from floor, keeping stomach tight. Hold for 3 seconds. Have Rosey Bath help keep left knee in place with this exercise. Repeat __10__ times per set. Do _1__ sets per session. Do _1-2_ sessions per day.  http://orth.exer.us/1096   Copyright  VHI. All rights reserved.  Bridging (Single Leg)    Lie on back with feet shoulder width apart and right leg straight, keep in on the bed/floor. Lift hips toward the ceiling while keeping leg on surface. Have Rosey Bath help keep your left leg in place.  Repeat __10_ times. Do __1-2__ sessions per day.  http://gt2.exer.us/358   Copyright  VHI. All rights reserved.   Bracing With Heel Slides (Supine)    With neutral spine, tighten pelvic floor and abdominals and hold. Slide left  heel to bottom. Have Rosey Bath support you at the knee and ankle with this exercise. Repeat _10__ times. Do _1-2__ times a day.   Copyright  VHI. All rights reserved.  ABDUCTION: Supine (Active)    Lie on back, legs straight. Slide left leg out to the side as far as possible. No weights at this time. Then bring left leg back into where you started. Have Rosey Bath help you keep foot pointed straight up toward ceiling. Complete __1_ sets of _10_ repetitions. Perform __1-2_ sessions per day.  Copyright  VHI. All rights reserved.      04/12/16 1531  PT Education  Education provided Yes  Education Details  HEP for left LE strengthening  Person(s) Educated Spouse;Patient  Methods Explanation;Demonstration;Verbal cues;Handout;Tactile cues  Comprehension Verbalized understanding;Returned demonstration;Verbal cues required;Tactile cues required;Need further instruction           PT Short Term Goals - 04/06/16 1256      PT SHORT TERM GOAL #1   Title Pt will verbalize understanding of risk factors and signs/sx's of CVA in order to reduce risk of addtional CVA. TARGET DATE FOR ALL STGS: 05/03/16    Status On-going     PT SHORT TERM GOAL #2   Title Perform BERG and write goal prn.    Baseline 9/13: Berg score = 9/56   Status Achieved     PT SHORT TERM GOAL #3   Title Pt will amb. 200' with LRAD and S over even terrain to improve functional mobility.    Status On-going     PT SHORT TERM GOAL #4   Title Pt will improve gait speed with LRAD to >/=0.379ft/sec. to improve functional mobility.    Status On-going     PT SHORT TERM GOAL #5   Title Pt will improve Berg score from 9/56 to 13/56 to indicate improved standing balance.    Status New           PT Long Term Goals - 04/06/16 1303      PT LONG TERM GOAL #1   Title Pt will be IND in HEP to improve flexibility, strength, and balance. TARGET DATE FOR ALL LTGS: 05/31/16   Status On-going     PT LONG TERM GOAL #2   Title Pt will improve gait speed to >/=1.628ft/sec with LRAD to decr. falls risk.    Status On-going     PT LONG TERM GOAL #3   Title Pt will amb. 500' over even and paved surfaces at with LRAD at MOD I level to improve functional mobility.    Status On-going     PT LONG TERM GOAL #4   Title Pt will report zero falls over the last 4 weeks to improve safety during functional mobility.    Status On-going     PT LONG TERM GOAL #5   Title Pt will improve Berg score from 9/56 to 17/56 to indicate improved standing balance.    Status New        04/12/16 1454  Plan  Clinical Impression Statement today's session focused on estabilshment of HEP for LE strengthening for home. No issues reported with performance in session today and pt's spouse verbalized how to assist pt at home. Pt needed frequent cues to task and redirection with increased time to process cues given. Pt is making steady progress toward goals and should benefit from continued PT to progress toward unmet goals.                                   Pt will benefit from skilled therapeutic intervention in order to improve on the following deficits  Abnormal gait;Decreased endurance;Impaired sensation;Decreased knowledge of use of DME;Decreased strength;Impaired UE functional use;Decreased mobility;Decreased balance;Decreased range of motion;Decreased cognition;Impaired flexibility;Postural dysfunction  Rehab Potential Good  Clinical Impairments Affecting Rehab Potential co-morbidities  PT Frequency 2x / week  PT Duration 8 weeks  PT Treatment/Interventions ADLs/Self Care Home Management;Biofeedback;Canalith Repostioning;Electrical Stimulation;Cognitive remediation;Neuromuscular re-education;Balance training;Therapeutic exercise;Therapeutic activities;Functional mobility training;Stair training;Gait training;DME Instruction;Orthotic Fit/Training;Patient/family education;Wheelchair mobility training;Manual techniques;Vestibular  PT Next Visit Plan CVA ed. Intiate  balance HEP. Consider modifying  assistive device to RW with L hand orthosis (if pt/wife amenable) to normalize postural alignment and gait pattern, decrease spasticity, increase LUE/LLE WB during ambulation.  Consulted and Agree with Plan of Care Patient;Family member/caregiver  Family Member Consulted wife-[name]      Patient will benefit from skilled therapeutic intervention in order to improve the following deficits and impairments:  Abnormal gait, Decreased endurance, Impaired sensation, Decreased knowledge of use of DME, Decreased strength, Impaired UE functional use, Decreased mobility, Decreased balance, Decreased range of motion, Decreased cognition, Impaired flexibility, Postural dysfunction  Visit Diagnosis: Hemiplegia and hemiparesis following cerebral infarction affecting left dominant side (HCC)  Muscle weakness (generalized)  Other lack of coordination     Problem List Patient Active Problem List   Diagnosis Date Noted  . Alcohol use disorder, severe, dependence (HCC) 02/15/2016  . Muscle contusion 02/11/2016  . Left flaccid hemiparesis (HCC)   . Muscle  spasticity   . Poor appetite   . Hyperlipidemia 01/14/2016  . Depression 01/14/2016  . Obesity 01/14/2016  . Hyperglycemia 01/14/2016  . Urinary tract infection, site not specified 01/14/2016  . Basal ganglia hemorrhage (HCC) 01/14/2016  . Hemiparesis affecting nondominant side as late effect of cerebrovascular accident (HCC)   . Cognitive deficit, post-stroke   . Left hemiparesis (HCC)   . Essential hypertension   . Dysphagia, post-stroke   . Gait disturbance, post-stroke   . Hyponatremia   . Thrombocytopenia (HCC)   . Hemorrhagic stroke (HCC) R basal ganglia d/t HTN 01/08/2016    Sallyanne Kuster, PTA, [facility] [address] [place], [place] [address] [phone] 04/14/16, 1:02 PM   Name: CRISS PALLONE MRN: [id number] Date of Birth: [birth year]

## 2016-04-15 ENCOUNTER — Ambulatory Visit: Payer: 59

## 2016-04-15 DIAGNOSIS — I69352 Hemiplegia and hemiparesis following cerebral infarction affecting left dominant side: Secondary | ICD-10-CM | POA: Diagnosis not present

## 2016-04-15 DIAGNOSIS — R471 Dysarthria and anarthria: Secondary | ICD-10-CM

## 2016-04-15 DIAGNOSIS — R41841 Cognitive communication deficit: Secondary | ICD-10-CM

## 2016-04-15 DIAGNOSIS — M6281 Muscle weakness (generalized): Secondary | ICD-10-CM

## 2016-04-15 DIAGNOSIS — R278 Other lack of coordination: Secondary | ICD-10-CM

## 2016-04-15 NOTE — Therapy (Signed)
[facility] [address] [place], [place], [address] Phone: [phone]   Fax:  [phone]  Physical Therapy Treatment  Patient Details  Name: Jon [name] MRN: [id number] Date of Birth: [birth year] Referring Provider: Dr. Wynn Banker  Encounter Date: 04/15/2016      PT End of Session - 04/15/16 1626    Visit Number 4   Number of Visits 17   Date for PT Re-Evaluation 06/04/16   Authorization Type UHC and Medicare? G-CODE AND PROGRESS NOTE EVERY 10TH VISIT.    PT Start Time 1534   PT Stop Time 1616   PT Time Calculation (min) 42 min   Equipment Utilized During Treatment Gait belt   Activity Tolerance Patient tolerated treatment well   Behavior During Therapy WFL for tasks assessed/performed      Past Medical History:  Diagnosis Date  . Hypertension   . Liver damage   . Stroke [facility])     Past Surgical History:  Procedure Laterality Date  . TONSILLECTOMY AND ADENOIDECTOMY Bilateral    Age 60    There were no vitals filed for this visit.      Subjective Assessment - 04/15/16 1537    Subjective Pt reported he had a cortisone shot in L shoulder a week ago and it has reduced shoulder pain. Pt denied falls since last visit. Pt reports intermittent L knee pain 2/2 OA.    Patient is accompained by: Family member  [name]-wife   Pertinent History Pt goes by Jon [name]".   PMH significant for: HTN, ETOH abuse per pt's wife but pt has not consumed alcohol since ICH, liver damage   Patient Stated Goals Control ankle movement better and improve placement of the foot.    Currently in Pain? No/denies                         [facility] Adult PT Treatment/Exercise - 04/15/16 1621      Transfers   Transfers Stand Pivot Transfers   Stand Pivot Transfers 4: Min guard   Transfer Cueing Cues for technique and to reach back to ensure mat is behind pt.  Min guard to ensure safety. Pt require PT to set up w/c prior to  txf.      Exercises   Exercises Knee/Hip     Knee/Hip Exercises: Stretches   Gastroc Stretch 3 reps;Left;Other (comment)  45sec,   Gastroc Stretch Limitations PT provided demo and cues for pt and pt's wife, to use sheet and caregiver assist for stretch in supine.    Soleus Stretch Left;2 reps;30 seconds   Soleus Stretch Limitations Pt in supine, PT provided cues and demo for technique and instructed pt to place pillow under L knee to stretch soleus vs. gastroc.    Other Knee/Hip Stretches Added to HEP, please see pt instructions for details.              Balance Exercises - 04/15/16 1624      Balance Exercises: Standing   Standing Eyes Opened Narrow base of support (BOS);Solid surface;Other reps (comment)  10 wt .shifts (side to side)    Other Standing Exercises PT provided assist to keep L knee ext vs. flexed in stance. Chair placed in front of pt for R UE support and safety, mat behind pt. PT supported L UE.      Self Care:     PT Education - 04/15/16 1625    Education provided Yes   Education  Details PT educated pt and provided pt with CVA handout ed and stretching HEP. PT educated pt on donning L AFO during wt. shifting HEP at home (will provide handout next session).    Person(s) Educated Patient;Spouse   Methods Explanation;Demonstration;Tactile cues;Verbal cues;Handout   Comprehension Returned demonstration;Verbalized understanding;Need further instruction          PT Short Term Goals - 04/06/16 1256      PT SHORT TERM GOAL #1   Title Pt will verbalize understanding of risk factors and signs/sx's of CVA in order to reduce risk of addtional CVA. TARGET DATE FOR ALL STGS: 05/03/16   Status On-going     PT SHORT TERM GOAL #2   Title Perform BERG and write goal prn.    Baseline 9/13: Berg score = 9/56   Status Achieved     PT SHORT TERM GOAL #3   Title Pt will amb. 200' with LRAD and S over even terrain to improve functional mobility.    Status On-going      PT SHORT TERM GOAL #4   Title Pt will improve gait speed with LRAD to >/=0.8ft/sec. to improve functional mobility.    Status On-going     PT SHORT TERM GOAL #5   Title Pt will improve Berg score from 9/56 to 13/56 to indicate improved standing balance.    Status New           PT Long Term Goals - 04/06/16 1303      PT LONG TERM GOAL #1   Title Pt will be IND in HEP to improve flexibility, strength, and balance. TARGET DATE FOR ALL LTGS: 05/31/16   Status On-going     PT LONG TERM GOAL #2   Title Pt will improve gait speed to >/=1.73ft/sec with LRAD to decr. falls risk.    Status On-going     PT LONG TERM GOAL #3   Title Pt will amb. 500' over even and paved surfaces at with LRAD at MOD I level to improve functional mobility.    Status On-going     PT LONG TERM GOAL #4   Title Pt will report zero falls over the last 4 weeks to improve safety during functional mobility.    Status On-going     PT LONG TERM GOAL #5   Title Pt will improve Berg score from 9/56 to 17/56 to indicate improved standing balance.    Status New               Plan - 04/15/16 1626    Clinical Impression Statement Today's session focused on stretching HEP in order to maintain L ankle ROM and caregiver/pt education to reduce risk of additional CVA. Pt continues to experience decr. weight shift to the L side during stance 2/2 weakness and impaired balance. Pt would continue to benefit from skilled PT to improve safety during functional mobility.    Rehab Potential Good   Clinical Impairments Affecting Rehab Potential co-morbidities   PT Frequency 2x / week   PT Duration 8 weeks   PT Treatment/Interventions ADLs/Self Care Home Management;Biofeedback;Canalith Repostioning;Electrical Stimulation;Cognitive remediation;Neuromuscular re-education;Balance training;Therapeutic exercise;Therapeutic activities;Functional mobility training;Stair training;Gait training;DME Instruction;Orthotic  Fit/Training;Patient/family education;Wheelchair mobility training;Manual techniques;Vestibular   PT Next Visit Plan Trial foot pedal bike as pt is using one at home, but states it is difficult to keep feet on pedals. Provide handout for balance (wt.shifting at counter) HEP. Consider modifying assistive device to RW with L hand orthosis (if pt/wife amenable) to normalize postural  alignment and gait pattern, decrease spasticity, increase LUE/LLE WB during ambulation.   PT Home Exercise Plan Strengthening and stretching HEP.   Consulted and Agree with Plan of Care Patient;Family member/caregiver   Family Member Consulted wife-[name]      Patient will benefit from skilled therapeutic intervention in order to improve the following deficits and impairments:  Abnormal gait, Decreased endurance, Impaired sensation, Decreased knowledge of use of DME, Decreased strength, Impaired UE functional use, Decreased mobility, Decreased balance, Decreased range of motion, Decreased cognition, Impaired flexibility, Postural dysfunction  Visit Diagnosis: Hemiplegia and hemiparesis following cerebral infarction affecting left dominant side (HCC)  Muscle weakness (generalized)  Other lack of coordination     Problem List Patient Active Problem List   Diagnosis Date Noted  . Alcohol use disorder, severe, dependence (HCC) 02/15/2016  . Muscle contusion 02/11/2016  . Left flaccid hemiparesis (HCC)   . Muscle spasticity   . Poor appetite   . Hyperlipidemia 01/14/2016  . Depression 01/14/2016  . Obesity 01/14/2016  . Hyperglycemia 01/14/2016  . Urinary tract infection, site not specified 01/14/2016  . Basal ganglia hemorrhage (HCC) 01/14/2016  . Hemiparesis affecting nondominant side as late effect of cerebrovascular accident (HCC)   . Cognitive deficit, post-stroke   . Left hemiparesis (HCC)   . Essential hypertension   . Dysphagia, post-stroke   . Gait disturbance, post-stroke   . Hyponatremia   .  Thrombocytopenia (HCC)   . Hemorrhagic stroke (HCC) R basal ganglia d/t HTN 01/08/2016    Mella Inclan L 04/15/2016, 4:31 PM  [facility] [address] [place], [place], [address] Phone: [phone]   Fax:  [phone]  Name: Jon [name] MRN: [id number] Date of Birth: 03/16/1956 Zerita BoersJennifer Asa Baudoin, PT,DPT 04/15/16 4:32 PM Phone: [phone] Fax: [phone]

## 2016-04-15 NOTE — Patient Instructions (Signed)
You can do these exercises in front of a mirror  Lips  - repeat each exercise 10-15 times, twice a day  1) Pucker your lips and say "oooooo", then spread your lips, smile, and say "eeeeee"  2) Pull the right side of your mouth back towards your ear, then the left side of your mouth    Tongue - repeat each exercise 10-15 times, twice a day  1) Lick your lips, top AND bottom lips

## 2016-04-15 NOTE — Patient Instructions (Addendum)
Stroke Prevention Some medical conditions and behaviors are associated with an increased chance of having a stroke. You may prevent a stroke by making healthy choices and managing medical conditions. HOW CAN I REDUCE MY RISK OF HAVING A STROKE?   Stay physically active. Get at least 30 minutes of activity on most or all days.  Do not smoke. It may also be helpful to avoid exposure to secondhand smoke.  Limit alcohol use. Moderate alcohol use is considered to be:  No more than 2 drinks per day for men.  No more than 1 drink per day for nonpregnant women.  Eat healthy foods. This involves:  Eating 5 or more servings of fruits and vegetables a day.  Making dietary changes that address high blood pressure (hypertension), high cholesterol, diabetes, or obesity.  Manage your cholesterol levels.  Making food choices that are high in fiber and low in saturated fat, trans fat, and cholesterol may control cholesterol levels.  Take any prescribed medicines to control cholesterol as directed by your health care provider.  Manage your diabetes.  Controlling your carbohydrate and sugar intake is recommended to manage diabetes.  Take any prescribed medicines to control diabetes as directed by your health care provider.  Control your hypertension.  Making food choices that are low in salt (sodium), saturated fat, trans fat, and cholesterol is recommended to manage hypertension.  Ask your health care provider if you need treatment to lower your blood pressure. Take any prescribed medicines to control hypertension as directed by your health care provider.  If you are 18-39 years of age, have your blood pressure checked every 3-5 years. If you are 40 years of age or older, have your blood pressure checked every year.  Maintain a healthy weight.  Reducing calorie intake and making food choices that are low in sodium, saturated fat, trans fat, and cholesterol are recommended to manage  weight.  Stop drug abuse.  Avoid taking birth control pills.  Talk to your health care provider about the risks of taking birth control pills if you are over 35 years old, smoke, get migraines, or have ever had a blood clot.  Get evaluated for sleep disorders (sleep apnea).  Talk to your health care provider about getting a sleep evaluation if you snore a lot or have excessive sleepiness.  Take medicines only as directed by your health care provider.  For some people, aspirin or blood thinners (anticoagulants) are helpful in reducing the risk of forming abnormal blood clots that can lead to stroke. If you have the irregular heart rhythm of atrial fibrillation, you should be on a blood thinner unless there is a good reason you cannot take them.  Understand all your medicine instructions.  Make sure that other conditions (such as anemia or atherosclerosis) are addressed. SEEK IMMEDIATE MEDICAL CARE IF:   You have sudden weakness or numbness of the face, arm, or leg, especially on one side of the body.  Your face or eyelid droops to one side.  You have sudden confusion.  You have trouble speaking (aphasia) or understanding.  You have sudden trouble seeing in one or both eyes.  You have sudden trouble walking.  You have dizziness.  You have a loss of balance or coordination.  You have a sudden, severe headache with no known cause.  You have new chest pain or an irregular heartbeat. Any of these symptoms may represent a serious problem that is an emergency. Do not wait to see if the symptoms will   go away. Get medical help at once. Call your local emergency services (911 in [place] Do not drive yourself to the hospital.   This information is not intended to replace advice given to you by your health care provider. Make sure you discuss any questions you have with your health care provider.   Document Released: 08/18/2004 Document Revised: 08/01/2014 Document Reviewed:  01/11/2013 Elsevier Interactive Patient Education 2016 Elsevier Inc.   Ankle: Dorsiflexion    Lie down and keep left knee straight. Grasp foot, cupping heel in your palm. Gently bring foot toward knee. Do not let knee bend. Hold __30-45__ seconds. Repeat __3__ times. Do __2-3__ sessions per day. Repeat with pillow behind left knee. CAUTION: Stretch should be gentle, steady and slow.  Copyright  VHI. All rights reserved.

## 2016-04-18 ENCOUNTER — Ambulatory Visit: Payer: 59 | Admitting: *Deleted

## 2016-04-18 ENCOUNTER — Ambulatory Visit: Payer: 59

## 2016-04-18 ENCOUNTER — Encounter: Payer: Self-pay | Admitting: *Deleted

## 2016-04-18 DIAGNOSIS — R41841 Cognitive communication deficit: Secondary | ICD-10-CM

## 2016-04-18 DIAGNOSIS — I69352 Hemiplegia and hemiparesis following cerebral infarction affecting left dominant side: Secondary | ICD-10-CM | POA: Diagnosis not present

## 2016-04-18 DIAGNOSIS — M6281 Muscle weakness (generalized): Secondary | ICD-10-CM

## 2016-04-18 DIAGNOSIS — R278 Other lack of coordination: Secondary | ICD-10-CM

## 2016-04-18 NOTE — Addendum Note (Signed)
Addended by: Verdie MosherSCHINKE, Anthonymichael Munday B on: 04/18/2016 08:50 AM   Modules accepted: Orders

## 2016-04-18 NOTE — Therapy (Signed)
[facility] [address] [place], [place], [address] Phone: [phone]   Fax:  [phone]  Speech Language Pathology Treatment  Patient Details  Name: Jon CoolerGeorge D Rasch MRN: [id number] Date of Birth: 06-27-1956 Referring Provider: Dr. Claudette LawsAndrew Kirsteins   Encounter Date: 04/18/2016      End of Session - 04/18/16 1054    Visit Number 5   Number of Visits 17   Date for SLP Re-Evaluation 05/31/16   Authorization Type 20 ST    Authorization - Visit Number 4   Authorization - Number of Visits 20   SLP Start Time 0800   SLP Stop Time  0845   SLP Time Calculation (min) 45 min   Activity Tolerance Patient tolerated treatment well      Past Medical History:  Diagnosis Date  . Hypertension   . Liver damage   . Stroke [facility](HCC)     Past Surgical History:  Procedure Laterality Date  . TONSILLECTOMY AND ADENOIDECTOMY Bilateral    Age 60    There were no vitals filed for this visit.      Subjective Assessment - 04/18/16 0840    Subjective I'm doing better.    Patient is accompained by: Family member  spouse   Currently in Pain? No/denies   Multiple Pain Sites No               ADULT SLP TREATMENT - 04/18/16 1050      General Information   Behavior/Cognition Cooperative;Pleasant mood;Distractible;Requires cueing;Decreased sustained attention     Treatment Provided   Treatment provided Cognitive-Linquistic     Cognitive-Linquistic Treatment   Treatment focused on Cognition   Skilled Treatment Focus this date on sustained attention, reading comprehension and functional visual scanning. Pt able to sustain attention to functional activity for 30 minutes with occasional verbal cueing. Pt able to demonstrate reading comprehension for multi-paragraph level material with min cueing to frame information. Pt self-initiated review of information when he required clarification.      Assessment / Recommendations / Plan    Plan Continue with current plan of care     Progression Toward Goals   Progression toward goals Progressing toward goals          SLP Education - 04/18/16 0843    Education provided Yes   Education Details HEP for labial ROM, lingual rom/coordination   Person(s) Educated Spouse;Patient   Methods Explanation;Demonstration;Verbal cues   Comprehension Verbalized understanding;Returned demonstration;Need further instruction          SLP Short Term Goals - 04/18/16 1056      SLP SHORT TERM GOAL #1   Title Pt will selectively attend to details on mildly complex cognitive linguistic task with distractions and 85% accuracy.   Time 3   Period Weeks   Status On-going     SLP SHORT TERM GOAL #2   Title Pt will perform mildy complex organization, reasoning and time/money problems with 85% accuracy and rare min A   Time 3   Period Weeks   Status On-going     SLP SHORT TERM GOAL #3   Title Pt  will utlize external memory aids/book to recall tasks, errands, lists, and reduce repetitive question asking/frustration over 3 sessions with occasional min A.   Time 3   Period Weeks   Status On-going     SLP SHORT TERM GOAL #4   Title Pt will face and maintain eye contact conversation partner on his left side with rare min  A for 5 minutes conversation.    Time 3   Period Weeks   Status On-going     SLP SHORT TERM GOAL #5   Title pt will complete HEP with occasional min A (for dysarthria)   Time 3   Period Weeks   Status On-going          SLP Long Term Goals - 04/18/16 1057      SLP LONG TERM GOAL #1   Title Pt will alterate attention between 2 mildly complex cognitive linguistic tasks with 85% on each and occasional min A   Time 7   Period Weeks   Status On-going     SLP LONG TERM GOAL #2   Title Pt will solve moderately complex reasoning, functional math and organization problems with 90% accuracy and occasional min A   Time 7   Period Weeks   Status On-going      SLP LONG TERM GOAL #3   Title Pt will utilize external aids for medication management with rare min A over 3 sessions.   Time 7   Period Weeks   Status On-going     SLP LONG TERM GOAL #4   Title pt will complete HEP for dysarthria with rare min A over two sessions   Time 7   Period Weeks   Status On-going          Plan - 04/18/16 1055    Clinical Impression Statement Improving self-monitoring resulting in greater functional independence.    Speech Therapy Frequency 2x / week   Duration --  7 weeks   Treatment/Interventions Compensatory strategies;Functional tasks;Patient/family education;Cueing hierarchy;Cognitive reorganization;Internal/external aids;SLP instruction and feedback   Potential to Achieve Goals Good   Potential Considerations Severity of impairments      Patient will benefit from skilled therapeutic intervention in order to improve the following deficits and impairments:   Cognitive communication deficit    Problem List Patient Active Problem List   Diagnosis Date Noted  . Alcohol use disorder, severe, dependence (HCC) 02/15/2016  . Muscle contusion 02/11/2016  . Left flaccid hemiparesis (HCC)   . Muscle spasticity   . Poor appetite   . Hyperlipidemia 01/14/2016  . Depression 01/14/2016  . Obesity 01/14/2016  . Hyperglycemia 01/14/2016  . Urinary tract infection, site not specified 01/14/2016  . Basal ganglia hemorrhage (HCC) 01/14/2016  . Hemiparesis affecting nondominant side as late effect of cerebrovascular accident (HCC)   . Cognitive deficit, post-stroke   . Left hemiparesis (HCC)   . Essential hypertension   . Dysphagia, post-stroke   . Gait disturbance, post-stroke   . Hyponatremia   . Thrombocytopenia (HCC)   . Hemorrhagic stroke (HCC) R basal ganglia d/t HTN 01/08/2016    Rocky Crafts MA, CCC-SLP 04/18/2016, 10:59 AM  [name] [address] [place], [place],  [address] Phone: [phone]   Fax:  [phone]   Name: Jon [name] MRN: [id number] Date of Birth: [birth year]

## 2016-04-18 NOTE — Therapy (Signed)
[facility] [address] [place], [place], [address] Phone: [phone]   Fax:  [phone]  Speech Language Pathology Treatment  Patient Details  Name: Jon CoolerGeorge D Rafferty MRN: [id number] Date of Birth: [birth year] Referring Provider: Dr. Claudette LawsAndrew Kirsteins   Encounter Date: 04/15/2016    Past Medical History:  Diagnosis Date  . Hypertension   . Liver damage   . Stroke [facility](HCC)     Past Surgical History:  Procedure Laterality Date  . TONSILLECTOMY AND ADENOIDECTOMY Bilateral    Age 60    There were no vitals filed for this visit.      Subjective Assessment - 04/18/16 0840    Subjective I'm doing better.    Patient is accompained by: Family member  spouse   Currently in Pain? No/denies   Multiple Pain Sites No               ADULT SLP TREATMENT - 04/18/16 0001      General Information   Behavior/Cognition Cooperative;Pleasant mood;Distractible;Requires cueing;Decreased sustained attention     Treatment Provided   Treatment provided Cognitive-Linquistic     Cognitive-Linquistic Treatment   Treatment focused on Dysarthria   Skilled Treatment (speech tx) Given pt's "S" statement, SLP designed a HEP for labial/lingual ROM. Pt was independent with this by the time (cog skills: 14 minutes) SLP modeled each exercise and provided min-mod A.  SLP facilitated attention by written tasks today, with rare min A back to task, and occasional min-mod A for error awareness. Pt rechecked his answers 80% of the time. SLP ensured pt knew the goal was to be both accurate and timely with these tasks.     Assessment / Recommendations / Plan   Plan Continue with current plan of care     Progression Toward Goals   Progression toward goals Progressing toward goals  pt will need intellectual awareness to progress in ST          SLP Education - 04/18/16 0843    Education provided Yes   Education Details HEP for labial ROM,  lingual rom/coordination   Person(s) Educated Spouse;Patient   Methods Explanation;Demonstration;Verbal cues   Comprehension Verbalized understanding;Returned demonstration;Need further instruction          SLP Short Term Goals - 04/15/16 1423      SLP SHORT TERM GOAL #1   Title Pt will selectively attend to details on mildly complex cognitive linguistic task with distractions and 85% accuracy.   Time 3   Period Weeks   Status On-going     SLP SHORT TERM GOAL #2   Title Pt will perform mildy complex organization, reasoning and time/money problems with 85% accuracy and rare min A   Time 3   Period Weeks   Status On-going     SLP SHORT TERM GOAL #3   Title Pt  will utlize external memory aids/book to recall tasks, errands, lists, and reduce repetitive question asking/frustration over 3 sessions with occasional min A.   Time 3   Period Weeks   Status On-going     SLP SHORT TERM GOAL #4   Title Pt will face and maintain eye contact conversation partner on his left side with rare min A for 5 minutes conversation.    Time 3   Period Weeks   Status On-going     SLP SHORT TERM GOAL #5   Title pt will complete HEP with occasional min A (for dysarthria)   Time 3   Period  Weeks   Status New          SLP Long Term Goals - 04/15/16 1422      SLP LONG TERM GOAL #1   Title Pt will alterate attention between 2 mildly complex cognitive linguistic tasks with 85% on each and occasional min A   Time 7   Period Weeks   Status On-going     SLP LONG TERM GOAL #2   Title Pt will solve moderately complex reasoning, functional math and organization problems with 90% accuracy and occasional min A   Time 7   Period Weeks   Status On-going     SLP LONG TERM GOAL #3   Title Pt will utilize external aids for medication management with rare min A over 3 sessions.   Time 7   Period Weeks   Status On-going     SLP LONG TERM GOAL #4   Title pt will complete HEP for dysarthria with rare  min A over two sessions   Time 7   Period Weeks   Status New          Plan - 04/18/16 0844    Clinical Impression Statement Pt presents today with demonstrated deficits in attention, lt inattention, awareness, and related cognitive communication deficits. Pt also complained today of reduced speech skills, although articulation is certainly WFL/likely WNL. However due to this, SLP designed HEP for lingual and labial musculature. Skilled ST remains necessary to maximize pt independence and decr caregiver burden.   Speech Therapy Frequency 2x / week   Duration --  7 weeks   Treatment/Interventions Compensatory strategies;Functional tasks;Patient/family education;Cueing hierarchy;Cognitive reorganization;Internal/external aids;SLP instruction and feedback   Potential to Achieve Goals Good   Potential Considerations Cooperation/participation level;Severity of impairments      Patient will benefit from skilled therapeutic intervention in order to improve the following deficits and impairments:   Cognitive communication deficit  Dysarthria and anarthria    Problem List Patient Active Problem List   Diagnosis Date Noted  . Alcohol use disorder, severe, dependence (HCC) 02/15/2016  . Muscle contusion 02/11/2016  . Left flaccid hemiparesis (HCC)   . Muscle spasticity   . Poor appetite   . Hyperlipidemia 01/14/2016  . Depression 01/14/2016  . Obesity 01/14/2016  . Hyperglycemia 01/14/2016  . Urinary tract infection, site not specified 01/14/2016  . Basal ganglia hemorrhage (HCC) 01/14/2016  . Hemiparesis affecting nondominant side as late effect of cerebrovascular accident (HCC)   . Cognitive deficit, post-stroke   . Left hemiparesis (HCC)   . Essential hypertension   . Dysphagia, post-stroke   . Gait disturbance, post-stroke   . Hyponatremia   . Thrombocytopenia (HCC)   . Hemorrhagic stroke (HCC) R basal ganglia d/t HTN 01/08/2016    [facility] 04/18/2016, 8:48 AM  [facility] [address] [place], [place], [address] Phone: [phone]   Fax:  [phone]   Name: ELMOND POEHLMAN MRN: [id number] Date of Birth: [birth year]

## 2016-04-18 NOTE — Therapy (Signed)
[facility] [address] [place], [place], [address] Phone: [phone]   Fax:  [phone]  Physical Therapy Treatment  Patient Details  Name: Jon [name] MRN: [id number] Date of Birth: 03-27-1956 Referring Provider: Dr. Wynn Banker  Encounter Date: 04/18/2016      PT End of Session - 04/18/16 1342    Visit Number 5   Number of Visits 17   Date for PT Re-Evaluation 06/04/16   Authorization Type UHC and Medicare? G-CODE AND PROGRESS NOTE EVERY 10TH VISIT.    PT Start Time [phone]  pt with OT   PT Stop Time 1014   PT Time Calculation (min) 38 min   Equipment Utilized During Treatment Gait belt   Activity Tolerance Patient tolerated treatment well   Behavior During Therapy WFL for tasks assessed/performed      Past Medical History:  Diagnosis Date  . Hypertension   . Liver damage   . Stroke [facility])     Past Surgical History:  Procedure Laterality Date  . TONSILLECTOMY AND ADENOIDECTOMY Bilateral    Age 60    There were no vitals filed for this visit.      Subjective Assessment - 04/18/16 0939    Subjective Pt denied falls since last visit. Pt reported he went to a family reunion over the weekend and was able to use his w/c.    Patient is accompained by: Family member  [name]-wife   Pertinent History Pt goes by [employer]".   PMH significant for: HTN, ETOH abuse per pt's wife but pt has not consumed alcohol since ICH, liver damage   Patient Stated Goals Control ankle movement better and improve placement of the foot.    Currently in Pain? No/denies                         [facility] Adult PT Treatment/Exercise - 04/18/16 0951      Ambulation/Gait   Ambulation/Gait Yes   Ambulation/Gait Assistance 4: Min guard;4: Min assist   Ambulation/Gait Assistance Details L AFO donned and L hand orthosis donned on RW. Cues for sequencing and technique, pt required 2 seated rest breaks 2/2 fatigue.    Ambulation Distance (Feet) 40 Feet  x2 x15'   Assistive device Rolling walker   Gait Pattern Step-through pattern;Decreased stride length;Decreased dorsiflexion - left;Decreased hip/knee flexion - left;Decreased weight shift to left;Left circumduction;Decreased stance time - left;Left flexed knee in stance;Lateral trunk lean to right;Trunk rotated posteriorly on left;Narrow base of support;Poor foot clearance - left   Ambulation Surface Level;Indoor     Exercises   Exercises Knee/Hip     Knee/Hip Exercises: Aerobic   Other Aerobic Pt seated in w/c with B feet placed in floor foot pedal bike, with PT providing assist to place feet in pedals. Pt performed for 3 bouts of 1 minute. Assist to keep feet on pedals and to scoot w/c away from bike, as pt with incr. posterior tilt in w/c. PT suggested pt use foot slippers vs. tennis shoes or socks at home in order to attempt to keep feet on pedals. Incr. time for set-up.            Self Care:     PT Education - 04/18/16 1340    Education provided Yes   Education Details PT educated pt that pt continue to donn PRAFO at night to prevent PF contractures and reduce the risk of heel pressure ulcers, as pt asked PT if he  could wear a foot up brace (showed PT a picture) in place of the [facility]. PT educated pt on the differences and benefits of wearing the PRAFO instead of foot up brace. PT educated pt on using a body pillow or multiple pillows in order to sleep on his R side, so he can change positions (per pt request), keep PRAFO donned and promote good skin integrity.    Person(s) Educated Patient;Spouse   Methods Explanation   Comprehension Verbalized understanding          PT Short Term Goals - 04/06/16 1256      PT SHORT TERM GOAL #1   Title Pt will verbalize understanding of risk factors and signs/sx's of CVA in order to reduce risk of addtional CVA. TARGET DATE FOR ALL STGS: 05/03/16   Status On-going     PT SHORT TERM GOAL #2   Title  Perform BERG and write goal prn.    Baseline 9/13: Berg score = 9/56   Status Achieved     PT SHORT TERM GOAL #3   Title Pt will amb. 200' with LRAD and S over even terrain to improve functional mobility.    Status On-going     PT SHORT TERM GOAL #4   Title Pt will improve gait speed with LRAD to >/=0.3779ft/sec. to improve functional mobility.    Status On-going     PT SHORT TERM GOAL #5   Title Pt will improve Berg score from 9/56 to 13/56 to indicate improved standing balance.    Status New           PT Long Term Goals - 04/06/16 1303      PT LONG TERM GOAL #1   Title Pt will be IND in HEP to improve flexibility, strength, and balance. TARGET DATE FOR ALL LTGS: 05/31/16   Status On-going     PT LONG TERM GOAL #2   Title Pt will improve gait speed to >/=1.738ft/sec with LRAD to decr. falls risk.    Status On-going     PT LONG TERM GOAL #3   Title Pt will amb. 500' over even and paved surfaces at with LRAD at MOD I level to improve functional mobility.    Status On-going     PT LONG TERM GOAL #4   Title Pt will report zero falls over the last 4 weeks to improve safety during functional mobility.    Status On-going     PT LONG TERM GOAL #5   Title Pt will improve Berg score from 9/56 to 17/56 to indicate improved standing balance.    Status New               Plan - 04/18/16 1343    Clinical Impression Statement Pt demonstrated improved upright posture and LLE WB during amb. with RW with L hand walker splint (orthosis) vs. hemi-walker. Pt required cues for sequencing and technique, but was able to progress to min cues vs. max cues for upright posture, improved step length, and to keep LLE inside RW. Continue with POC.    Rehab Potential Good   Clinical Impairments Affecting Rehab Potential co-morbidities   PT Frequency 2x / week   PT Duration 8 weeks   PT Treatment/Interventions ADLs/Self Care Home Management;Biofeedback;Canalith Repostioning;Electrical  Stimulation;Cognitive remediation;Neuromuscular re-education;Balance training;Therapeutic exercise;Therapeutic activities;Functional mobility training;Stair training;Gait training;DME Instruction;Orthotic Fit/Training;Patient/family education;Wheelchair mobility training;Manual techniques;Vestibular   PT Next Visit Plan Provide handout for balance (wt.shifting at counter) HEP. Continue gait with L hand orthosis on RW  to normalize postural alignment and gait pattern, decrease spasticity, increase LUE/LLE WB during ambulation.   PT Home Exercise Plan Strengthening and stretching HEP.   Consulted and Agree with Plan of Care Patient;Family member/caregiver   Family Member Consulted wife-[name]      Patient will benefit from skilled therapeutic intervention in order to improve the following deficits and impairments:  Abnormal gait, Decreased endurance, Impaired sensation, Decreased knowledge of use of DME, Decreased strength, Impaired UE functional use, Decreased mobility, Decreased balance, Decreased range of motion, Decreased cognition, Impaired flexibility, Postural dysfunction  Visit Diagnosis: Hemiplegia and hemiparesis following cerebral infarction affecting left dominant side (HCC)  Muscle weakness (generalized)  Other lack of coordination     Problem List Patient Active Problem List   Diagnosis Date Noted  . Alcohol use disorder, severe, dependence (HCC) 02/15/2016  . Muscle contusion 02/11/2016  . Left flaccid hemiparesis (HCC)   . Muscle spasticity   . Poor appetite   . Hyperlipidemia 01/14/2016  . Depression 01/14/2016  . Obesity 01/14/2016  . Hyperglycemia 01/14/2016  . Urinary tract infection, site not specified 01/14/2016  . Basal ganglia hemorrhage (HCC) 01/14/2016  . Hemiparesis affecting nondominant side as late effect of cerebrovascular accident (HCC)   . Cognitive deficit, post-stroke   . Left hemiparesis (HCC)   . Essential hypertension   . Dysphagia, post-stroke    . Gait disturbance, post-stroke   . Hyponatremia   . Thrombocytopenia (HCC)   . Hemorrhagic stroke (HCC) R basal ganglia d/t HTN 01/08/2016    Oluwaseun Cremer L 04/18/2016, 1:46 PM  [facility] [address] [place], [place], [address] Phone: [phone]   Fax:  [phone]  Name: Jon [name] MRN: [id number] Date of Birth: [birth year]  Zerita Boers, PT,DPT 04/18/16 1:47 PM Phone: [phone] Fax: [phone]

## 2016-04-18 NOTE — Therapy (Signed)
[facility] [address] [place], [place], [address] Phone: [phone]   Fax:  [phone]  Occupational Therapy Treatment  Patient Details  Name: NAJI MEHRINGER MRN: [id number] Date of Birth: 1956-05-31 Referring Provider: Dr Claudette Laws  Encounter Date: 04/18/2016      OT End of Session - 04/18/16 1327    Visit Number 3   Number of Visits 12   Date for OT Re-Evaluation 05/30/16   Authorization Type UHC [facility]) & medicare ? - G code and progress note every 10th visit   OT Start Time 0848   OT Stop Time [phone]   OT Time Calculation (min) 50 min   Activity Tolerance Patient tolerated treatment well   Behavior During Therapy [facility] for tasks assessed/performed      Past Medical History:  Diagnosis Date  . Hypertension   . Liver damage   . Stroke [facility])     Past Surgical History:  Procedure Laterality Date  . TONSILLECTOMY AND ADENOIDECTOMY Bilateral    Age 60    There were no vitals filed for this visit.      Subjective Assessment - 04/18/16 0858    Subjective  Pt reports that he has hallucinations as he is falling asleep and/or waking up suddenly from sleep. "I thought my wife was up on the wall where the tv is, playing with her hair, and she wasn't" Pt reports incidents like this on multiple occasions, never during waking hours. He denies dilopia.    Patient is accompained by: Family member   Patient Stated Goals Get my left arm going again and have more control of my left foot.   Currently in Pain? No/denies   Multiple Pain Sites No                      OT Treatments/Exercises (OP) - 04/18/16 0001      Transfers   Transfers Sit to Stand;Stand to Sit   Sit to Stand 4: Min guard   Sit to Stand Details (indicate cue type and reason) Cues for LLE   Stand to Sit 4: Min guard     ADLs   LB Dressing LB 1 handed dressing techniques today in treatment room w/ and w/o use of  A/E to don/doff socks, AFO, shoe. LLE and RLE as well. Pt requires redirection to task. Suggest sitting in w/c w/ step stool as pt/spouse report that bed is too high. Pt was overall increased time and min assist to don shoe w/ AFO in it, otherwise, he required increased time. He appeared to like using long handled dressing stick to assist in removing sock from LLE, but did not prefer use LH reacher. LH showhorn was beneficial as well. Discussed use of left lifter vs using RLE to assist with lifting LLE when lying down in bed or moving/pacing foot on floor. Also discussed placing LLE on RLE/knee for dressing (Pt able to do this but reports that he has h/o OA, therefore small step stoll while seated in w/c recommended).    ADL Comments Handouts issued and reviewed for 1 handed dressing.     Exercises   Exercises --  Verbally reviewed importance of PROM/home program.     Shoulder Exercises: Supine   Other Supine Exercises Reviewed home program and importance as pt/spouse report not perofrming exercises "If they hurt, we don't do them" Discussed stretch vs pain and need to to range shoulder for ease of performing  ADL's if he has increased PROM LUE. Both verbalized understanding of this concept.                OT Education - 04/18/16 1325    Education provided Yes   Education Details ADL's and 1 handed dressing techniques. Also discussed Importance of HEP for LUE PROM as pt/spouse report not performing ex's if having pain. Discussed stretch vs pain and need for PROM for increased ease with ADL's and self care tasks.   Person(s) Educated Patient;Spouse   Methods Explanation;Demonstration;Verbal cues   Comprehension Verbalized understanding;Returned demonstration;Need further instruction          OT Short Term Goals - 04/04/16 1306      OT SHORT TERM GOAL #1   Title Pt will I state precautions/signs and symptoms of CVA   Baseline VC's   Time 4   Period Weeks   Status New     OT SHORT  TERM GOAL #2   Title Pt will demonstrate Mod I HEP for LUE self ROM    Baseline VC's and tc's   Time 4   Period Weeks   Status New     OT SHORT TERM GOAL #3   Title Pt will I state 2-3 edema control techniques LUE    Baseline VC's/Min A   Time 4   Period Weeks   Status New           OT Long Term Goals - 04/04/16 1307      OT LONG TERM GOAL #1   Title Pt will be Mod I upgrade HEP LUE   Time 8   Period Weeks   Status New     OT LONG TERM GOAL #2   Title Pt will be Mod I simple snack prep in ADL kitchen (using manual w/c vs hemi walker).   Time 8   Period Weeks   Status New     OT LONG TERM GOAL #3   Title Pt will be Mod I basic ADL transfers using hemi walker    Time 8   Period Weeks   Status New     OT LONG TERM GOAL #4   Title Pt will be supervision LB dressing w/ a/e or DME PRN, using 1 handed technique   Time 8   Period Weeks   Status New     OT LONG TERM GOAL #5   Title Pt will be supervision level simulated bathing UB/LB using 1 handed technique   Time 8   Period Weeks   Status New               Plan - 04/18/16 1329    Clinical Impression Statement As stated during previous session, pt should benfit from performance of HEP for LUE & improved positioning during ADL's and functional transfers/sleeping. Pt progressing with 1 handed dressing techniques today and possible A/E use options to assit with increased independence with ADL's using 1 handed technique.   Rehab Potential Fair   OT Frequency 2x / week   OT Duration 8 weeks   OT Treatment/Interventions Self-care/ADL training;Therapeutic exercise;DME and/or AE instruction;Building services engineer;Therapeutic activities;Patient/family education;Splinting;Neuromuscular education;Electrical Stimulation;Passive range of motion;Therapeutic exercises;Visual/perceptual remediation/compensation   Plan Supine ex's on mat table, review PROM LUE.    Consulted and Agree with Plan of Care Patient;Family  member/caregiver   Family Member Consulted Spouse      Patient will benefit from skilled therapeutic intervention in order to improve the following deficits and impairments:  Abnormal  gait, Decreased balance, Decreased endurance, Difficulty walking, Impaired sensation, Impaired vision/preception, Pain, Increased edema, Decreased range of motion, Decreased cognition, Decreased activity tolerance, Decreased coordination, Decreased knowledge of use of DME, Decreased safety awareness, Decreased strength, Impaired UE functional use  Visit Diagnosis: Hemiplegia and hemiparesis following cerebral infarction affecting left dominant side (HCC)  Muscle weakness (generalized)  Other lack of coordination  Cognitive communication deficit    Problem List Patient Active Problem List   Diagnosis Date Noted  . Alcohol use disorder, severe, dependence (HCC) 02/15/2016  . Muscle contusion 02/11/2016  . Left flaccid hemiparesis (HCC)   . Muscle spasticity   . Poor appetite   . Hyperlipidemia 01/14/2016  . Depression 01/14/2016  . Obesity 01/14/2016  . Hyperglycemia 01/14/2016  . Urinary tract infection, site not specified 01/14/2016  . Basal ganglia hemorrhage (HCC) 01/14/2016  . Hemiparesis affecting nondominant side as late effect of cerebrovascular accident (HCC)   . Cognitive deficit, post-stroke   . Left hemiparesis (HCC)   . Essential hypertension   . Dysphagia, post-stroke   . Gait disturbance, post-stroke   . Hyponatremia   . Thrombocytopenia (HCC)   . Hemorrhagic stroke (HCC) R basal ganglia d/t HTN 01/08/2016    Halea Lieb Dionicio StallBeth Dixon, OTR/L 04/18/2016, 1:33 PM  [facility] [address] [place], [place], [address] Phone: [phone]   Fax:  [phone]  Name: Tawanna CoolerGeorge D Ellinwood MRN: [id number] Date of Birth: June 29, 1956

## 2016-04-18 NOTE — Patient Instructions (Signed)
Dressing: One-Handed Sock Donning    Use socks which have light elastic. Prop foot on opposite leg, low stool or chair. Use fingers to spread sock open. Slide over all toes. Pull over heel and up.  Dressing: Putting on T-Shirt    With shirt facing down, guide affected arm from bottom opening through sleeve. Pull shirt up to shoulder and put other arm through sleeve. Hold back of neck opening to pull up and over head. Pull shirt down over trunk.  Dressing: [employer] Opening Shirt    Lean forward to allow affected arm to drop into sleeve. Pull up to shoulder and around back. Put other arm into sleeve. To remove shirt, start with unaffected arm.

## 2016-04-19 ENCOUNTER — Encounter: Payer: Self-pay | Admitting: Occupational Therapy

## 2016-04-19 ENCOUNTER — Ambulatory Visit: Payer: Self-pay

## 2016-04-21 ENCOUNTER — Ambulatory Visit (HOSPITAL_BASED_OUTPATIENT_CLINIC_OR_DEPARTMENT_OTHER): Payer: 59 | Admitting: Physical Medicine & Rehabilitation

## 2016-04-21 ENCOUNTER — Encounter: Payer: Self-pay | Admitting: Occupational Therapy

## 2016-04-21 ENCOUNTER — Encounter: Payer: 59 | Attending: Physical Medicine & Rehabilitation

## 2016-04-21 ENCOUNTER — Encounter: Payer: Self-pay | Admitting: *Deleted

## 2016-04-21 ENCOUNTER — Encounter: Payer: Self-pay | Admitting: Physical Medicine & Rehabilitation

## 2016-04-21 VITALS — BP 106/64 | HR 58 | Resp 16

## 2016-04-21 DIAGNOSIS — I639 Cerebral infarction, unspecified: Secondary | ICD-10-CM | POA: Diagnosis present

## 2016-04-21 DIAGNOSIS — Z5189 Encounter for other specified aftercare: Secondary | ICD-10-CM | POA: Diagnosis not present

## 2016-04-21 DIAGNOSIS — I69319 Unspecified symptoms and signs involving cognitive functions following cerebral infarction: Secondary | ICD-10-CM

## 2016-04-21 DIAGNOSIS — I10 Essential (primary) hypertension: Secondary | ICD-10-CM | POA: Insufficient documentation

## 2016-04-21 DIAGNOSIS — G8194 Hemiplegia, unspecified affecting left nondominant side: Secondary | ICD-10-CM | POA: Diagnosis not present

## 2016-04-21 DIAGNOSIS — I69398 Other sequelae of cerebral infarction: Secondary | ICD-10-CM

## 2016-04-21 DIAGNOSIS — R269 Unspecified abnormalities of gait and mobility: Secondary | ICD-10-CM | POA: Diagnosis not present

## 2016-04-21 DIAGNOSIS — I610 Nontraumatic intracerebral hemorrhage in hemisphere, subcortical: Secondary | ICD-10-CM

## 2016-04-21 DIAGNOSIS — I629 Nontraumatic intracranial hemorrhage, unspecified: Secondary | ICD-10-CM | POA: Insufficient documentation

## 2016-04-21 DIAGNOSIS — F1010 Alcohol abuse, uncomplicated: Secondary | ICD-10-CM | POA: Insufficient documentation

## 2016-04-21 DIAGNOSIS — R252 Cramp and spasm: Secondary | ICD-10-CM | POA: Insufficient documentation

## 2016-04-21 NOTE — Progress Notes (Addendum)
Subjective:    Patient ID: Jon [name], male    DOB: [birth year], 60 y.o.   MRN: [id number] 60 year old right-handed male with history of hypertension as well as alcohol use, on no prescription medications.  Married, working full-time.  Presented January 08, 2016, with acute onset of headache and left-sided weakness.  Initial blood pressures were reportedly elevated.  CT of the head showed right basal ganglia hemorrhage with 31 mL volume, 3 mm midline shift.  CT angiogram of the head and neck with no extracranial stenosis or dissection. Echocardiogram with ejection fraction of 65%, no wall motion abnormalities.  The patient did not receive tPA.   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 General activity 1 Relation with others 0 Enjoyment of life 0 What TIME of day is your pain at its worst? night Sleep (in general) Fair  Pain is worse with: inactivity Pain improves with: rest and medication Relief from Meds: 2  Mobility walk with assistance use a walker use a wheelchair needs help with transfers  Function I need assistance with the following:  dressing, bathing, toileting, meal prep, household duties and shopping  Neuro/Psych weakness numbness trouble walking  Prior Studies Any changes since last visit?  no  Physicians involved in your care Any changes since last visit?  no   No family history on file. Social History   Social History  . Marital status: Married    Spouse name: N/A  . Number of children: N/A  . Years of education: N/A   Social History Main Topics  .  Smoking status: Former Smoker    Packs/day: 0.50    Years: 19.00    Types: Cigarettes    Quit date: 02/22/2000  . Smokeless tobacco: Never Used  . Alcohol use No     Comment: Wife reports chronic alcohol use.   . Drug use: No     Comment: teens to early 20's  . Sexual activity: Not Asked   Other Topics Concern  . None   Social History Narrative  . None   Past Surgical History:  Procedure Laterality Date  . TONSILLECTOMY AND ADENOIDECTOMY Bilateral    Age 54   Past Medical History:  Diagnosis Date  . Hypertension   . Liver damage   . Stroke (HCC)    BP 106/64   Pulse (!) 58   Resp 16   SpO2 95%   Opioid Risk Score:   Fall Risk Score:  `1  Depression screen PHQ 2/9  Depression screen PHQ 2/9 04/21/2016  Decreased Interest 0  Down, Depressed, Hopeless 0  PHQ - 2 Score 0    Review of Systems  Cardiovascular: Positive for leg swelling.  Genitourinary: Positive for difficulty urinating and dysuria.  All other systems reviewed and are negative.      Objective:   Physical Exam  Constitutional: He is oriented to person, place, and time. He appears well-developed and well-nourished.  HENT:  Head: Normocephalic and atraumatic.  Eyes: Conjunctivae and EOM are normal. Pupils are equal, round, and reactive to light.  Neck: Normal range  of motion.  Neurological: He is alert and oriented to person, place, and time. A sensory deficit is present. He exhibits abnormal muscle tone. Coordination and gait abnormal.  0/5 LUE 3- Left hip/knee ext synergy Visual fields intact to confrontation testing Spasticity clonus- at fingers    Psychiatric: He has a normal mood and affect.  Nursing note and vitals reviewed.        Assessment & Plan:  1. 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 He still has some left neglect, although this is improving. He has some visual changes post stroke. He initially had left visual field cut, but this seems to be resolving. I  expect continued improvement for the next 3 months. Would recommend neuro-ophthalmology in 3 months Patient not able to drive or able to resume work.  Continue outpatient PT, OT  2. Cognitive deficits. Post CVA, expected plateau around June 2018, continue speech therapy  3. Marital stress related to CVA. At this point would recommend psychology evaluation. 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 We will explore this further  Over half of the 25 min visit was spent counseling and coordinating care. Wife was crying because the patient acting differently.  4. Alcohol abuse, has not used since stroke

## 2016-04-22 ENCOUNTER — Ambulatory Visit: Payer: 59 | Admitting: Occupational Therapy

## 2016-04-22 ENCOUNTER — Ambulatory Visit: Payer: 59 | Admitting: Physical Therapy

## 2016-04-22 ENCOUNTER — Ambulatory Visit: Payer: 59 | Admitting: Speech Pathology

## 2016-04-22 DIAGNOSIS — R278 Other lack of coordination: Secondary | ICD-10-CM

## 2016-04-22 DIAGNOSIS — I69352 Hemiplegia and hemiparesis following cerebral infarction affecting left dominant side: Secondary | ICD-10-CM

## 2016-04-22 DIAGNOSIS — M6281 Muscle weakness (generalized): Secondary | ICD-10-CM

## 2016-04-22 DIAGNOSIS — R41841 Cognitive communication deficit: Secondary | ICD-10-CM

## 2016-04-22 NOTE — Therapy (Signed)
[facility] [address] [place], [place], [address] Phone: [phone]   Fax:  [phone]  Speech Language Pathology Treatment  Patient Details  Name: Jon [name] MRN: [id number] Date of Birth: 1955/10/19 Referring Provider: Dr. Claudette Laws   Encounter Date: 04/22/2016      End of Session - 04/22/16 1244    Visit Number 6   Number of Visits 17   Date for SLP Re-Evaluation 05/31/16   Authorization Type 20 ST    Authorization - Visit Number 5   Authorization - Number of Visits 20   SLP Start Time 1148   SLP Stop Time  1232   SLP Time Calculation (min) 44 min      Past Medical History:  Diagnosis Date  . Hypertension   . Liver damage   . Stroke [facility])     Past Surgical History:  Procedure Laterality Date  . TONSILLECTOMY AND ADENOIDECTOMY Bilateral    Age 60    There were no vitals filed for this visit.      Subjective Assessment - 04/22/16 1152    Subjective "He gives me work sheets"   Patient is accompained by: Family member   Special Tests [name]   Currently in Pain? No/denies               ADULT SLP TREATMENT - 04/22/16 1153      General Information   Behavior/Cognition Cooperative;Pleasant mood;Distractible;Requires cueing;Decreased sustained attention     Treatment Provided   Treatment provided Cognitive-Linquistic     Cognitive-Linquistic Treatment   Treatment focused on Cognition   Skilled Treatment Pt gave detailed report re: article he read with good accuracy.  Alternating attention between simple conversation and complex card sort with extended time and rare min A to re-attend to card sort. Extended time for card sort and rare min A to attend to the pile on the left side of pt.  Pt attended to details and solved mildly complex functional math with extended time and supervision cues.      Assessment / Recommendations / Plan   Plan Continue with current plan of care      Progression Toward Goals   Progression toward goals Progressing toward goals          SLP Education - 04/22/16 1241    Education provided Yes   Education Details processing speech and attention now vs prior to CVA to facilitate awareness   Person(s) Educated Patient;Spouse   Methods Explanation;Demonstration   Comprehension Verbal cues required;Verbalized understanding          SLP Short Term Goals - 04/22/16 1243      SLP SHORT TERM GOAL #1   Title Pt will selectively attend to details on mildly complex cognitive linguistic task with distractions and 85% accuracy.   Time 3   Period Weeks   Status On-going     SLP SHORT TERM GOAL #2   Title Pt will perform mildy complex organization, reasoning and time/money problems with 85% accuracy and rare min A   Time 3   Period Weeks   Status On-going     SLP SHORT TERM GOAL #3   Title Pt  will utlize external memory aids/book to recall tasks, errands, lists, and reduce repetitive question asking/frustration over 3 sessions with occasional min A.   Time 3   Period Weeks   Status On-going     SLP SHORT TERM GOAL #4   Title Pt will face and  maintain eye contact conversation partner on his left side with rare min A for 5 minutes conversation.    Time 3   Period Weeks   Status On-going     SLP SHORT TERM GOAL #5   Title pt will complete HEP with occasional min A (for dysarthria)   Time 3   Period Weeks   Status On-going          SLP Long Term Goals - 04/22/16 1243      SLP LONG TERM GOAL #1   Title Pt will alterate attention between 2 mildly complex cognitive linguistic tasks with 85% on each and occasional min A   Time 7   Period Weeks   Status On-going     SLP LONG TERM GOAL #2   Title Pt will solve moderately complex reasoning, functional math and organization problems with 90% accuracy and occasional min A   Time 7   Period Weeks   Status On-going     SLP LONG TERM GOAL #3   Title Pt will utilize external  aids for medication management with rare min A over 3 sessions.   Time 7   Period Weeks   Status On-going     SLP LONG TERM GOAL #4   Title pt will complete HEP for dysarthria with rare min A over two sessions   Time 7   Period Weeks   Status On-going          Plan - 04/22/16 1241    Clinical Impression Statement Pt improving selective and alternating attention with occasional min A - reasoning/problem solving mildly complex problems today with extended time and rare min A. Continues to require ongoing cues to look at person on the left when talking to them. Continue skilled ST to maximize cognition for improved independence.    Speech Therapy Frequency 2x / week   Treatment/Interventions Compensatory strategies;Functional tasks;Patient/family education;Cueing hierarchy;Cognitive reorganization;Internal/external aids;SLP instruction and feedback   Potential to Achieve Goals Good   Potential Considerations Severity of impairments   Consulted and Agree with Plan of Care Patient;Family member/caregiver   Family Member Consulted spouse, [name]      Patient will benefit from skilled therapeutic intervention in order to improve the following deficits and impairments:   Cognitive communication deficit    Problem List Patient Active Problem List   Diagnosis Date Noted  . Alcohol use disorder, severe, dependence (HCC) 02/15/2016  . Muscle contusion 02/11/2016  . Left flaccid hemiparesis (HCC)   . Muscle spasticity   . Poor appetite   . Hyperlipidemia 01/14/2016  . Depression 01/14/2016  . Obesity 01/14/2016  . Hyperglycemia 01/14/2016  . Urinary tract infection, site not specified 01/14/2016  . Basal ganglia hemorrhage (HCC) 01/14/2016  . Hemiparesis affecting nondominant side as late effect of cerebrovascular accident (HCC)   . Cognitive deficit, post-stroke   . Left hemiparesis (HCC)   . Essential hypertension   . Dysphagia, post-stroke   . Gait disturbance, post-stroke    . Hyponatremia   . Thrombocytopenia (HCC)   . Hemorrhagic stroke (HCC) R basal ganglia d/t HTN 01/08/2016    Antonette Hendricks, Radene JourneyLaura Ann MS, CCC-SLP 04/22/2016, 12:47 PM  [facility] [address] [place], [place], [address] Phone: [phone]   Fax:  [phone]   Name: Jon CoolerGeorge D [name] MRN: [id number] Date of Birth: [birth year]

## 2016-04-22 NOTE — Therapy (Signed)
[facility] [address] [place], [place], [address] Phone: [phone]   Fax:  [phone]  Physical Therapy Treatment  Patient Details  Name: Jon [name] MRN: [id number] Date of Birth: 59-Jan-1957 Referring Provider: Dr. Wynn BankerKirsteins  Encounter Date: 04/22/2016      PT End of Session - 04/22/16 1001    Visit Number 6   Number of Visits 17   Date for PT Re-Evaluation 06/04/16   Authorization Type UHC and Medicare? G-CODE AND PROGRESS NOTE EVERY 10TH VISIT.    PT Start Time [phone]   PT Stop Time 0930   PT Time Calculation (min) 44 min   Equipment Utilized During Treatment Gait belt   Activity Tolerance Patient limited by fatigue  Ambulation distance limited by pt fatigue   Behavior During [facility] for tasks assessed/performed      Past Medical History:  Diagnosis Date  . Hypertension   . Liver damage   . Stroke [facility](HCC)     Past Surgical History:  Procedure Laterality Date  . TONSILLECTOMY AND ADENOIDECTOMY Bilateral    Age 60    There were no vitals filed for this visit.      Subjective Assessment - 04/22/16 0850    Subjective Pt reports no falls, no significant changes.   Patient is accompained by: Family member  wife, [name]   Pertinent History Pt goes by [employer]"Jon [name]".   PMH significant for: HTN, ETOH abuse per pt's wife but pt has not consumed alcohol since ICH, liver damage   Patient Stated Goals Control ankle movement better and improve placement of the foot.    Currently in Pain? No/denies                         [facility] Adult PT Treatment/Exercise - 04/22/16 0001      Transfers   Transfers Sit to Stand;Stand to Sit   Sit to Stand 4: Min guard;4: Min assist   Sit to Stand Details (indicate cue type and reason) cueing for L foot placement, symmetrical WB, and min guard -min A required for postural control while pt using RUE to place LUE in hand orthosis, platform.   Stand to Sit 4:  Min guard;4: Min assist   Stand to Sit Details cueing for safe proximity to surface prior to sitting, for foot placement. Initially required subtle to min cueing to remind pt to remove L hand/forearm from L hand orthosis/platform. Min guard-min A required for stability/balance while pt managing LUE.     Ambulation/Gait   Ambulation/Gait Yes   Ambulation/Gait Assistance 4: Min guard;4: Min assist   Ambulation/Gait Assistance Details Gait x70' then x40' (seated rest break between trials due to pt fatigue) with RW and L AFO. Utilized L hand orthosis for initial trial, then L PFRW during subsequent trial for improved trunk alignment. Cueing addressed posterior rotation of trunk on L side, which improved alignment (less L hip ER) and decreased frequency of pt catching L foot on outside of RW leg.   Ambulation Distance (Feet) 110 Feet  x70' then x40'   Assistive device Rolling walker;Left platform walker;Other (Comment)  L hand orthosis, L AFO   Gait Pattern Step-through pattern;Decreased stride length;Decreased dorsiflexion - left;Decreased hip/knee flexion - left;Decreased weight shift to left;Decreased stance time - left;Left flexed knee in stance;Lateral trunk lean to right;Trunk rotated posteriorly on left;Narrow base of support;Poor foot clearance - left   Ambulation Surface Level;Indoor   Gait Comments Standing with  BUE support at countertop, pt performed M/L weight shifting + lifting foot 3-4" from floor x15 reps with hands-on assist (min A) of wife.  see Pt Instructions for details                PT Education - 04/22/16 1004    Education provided Yes   Education Details HEP: added weight shifting at countertop. See Pt Instructions.    Person(s) Educated Patient;Spouse   Methods Explanation;Demonstration;Verbal cues;Handout   Comprehension Returned demonstration          PT Short Term Goals - 04/06/16 1256      PT SHORT TERM GOAL #1   Title Pt will verbalize understanding of  risk factors and signs/sx's of CVA in order to reduce risk of addtional CVA. TARGET DATE FOR ALL STGS: 05/03/16   Status On-going     PT SHORT TERM GOAL #2   Title Perform BERG and write goal prn.    Baseline 9/13: Berg score = 9/56   Status Achieved     PT SHORT TERM GOAL #3   Title Pt will amb. 200' with LRAD and S over even terrain to improve functional mobility.    Status On-going     PT SHORT TERM GOAL #4   Title Pt will improve gait speed with LRAD to >/=0.92ft/sec. to improve functional mobility.    Status On-going     PT SHORT TERM GOAL #5   Title Pt will improve Berg score from 9/56 to 13/56 to indicate improved standing balance.    Status New           PT Long Term Goals - 04/06/16 1303      PT LONG TERM GOAL #1   Title Pt will be IND in HEP to improve flexibility, strength, and balance. TARGET DATE FOR ALL LTGS: 05/31/16   Status On-going     PT LONG TERM GOAL #2   Title Pt will improve gait speed to >/=1.65ft/sec with LRAD to decr. falls risk.    Status On-going     PT LONG TERM GOAL #3   Title Pt will amb. 500' over even and paved surfaces at with LRAD at MOD I level to improve functional mobility.    Status On-going     PT LONG TERM GOAL #4   Title Pt will report zero falls over the last 4 weeks to improve safety during functional mobility.    Status On-going     PT LONG TERM GOAL #5   Title Pt will improve Berg score from 9/56 to 17/56 to indicate improved standing balance.    Status New               Plan - 04/22/16 1002    Clinical Impression Statement Session focused on gait training with emphasis on trunk alignment (preventing posterior rotation of trunk on L) to improve LLE advancement/positioning. Trialed RW with L platform, which minimally improved trunk alignment and decreased compensatory movement patterns.    Rehab Potential Good   Clinical Impairments Affecting Rehab Potential co-morbidities   PT Frequency 2x / week   PT Duration 8  weeks   PT Treatment/Interventions ADLs/Self Care Home Management;Biofeedback;Canalith Repostioning;Electrical Stimulation;Cognitive remediation;Neuromuscular re-education;Balance training;Therapeutic exercise;Therapeutic activities;Functional mobility training;Stair training;Gait training;DME Instruction;Orthotic Fit/Training;Patient/family education;Wheelchair mobility training;Manual techniques;Vestibular   PT Next Visit Plan Continue gait with L hand orthosis on RW to normalize postural alignment and gait pattern, decrease spasticity, increase LUE/LLE WB during ambulation.   PT Home Exercise Plan Strengthening and  stretching HEP. 9/29: provided handout for weight shifting at countertop (wife safe to provide assist with this)   Consulted and Agree with Plan of Care Patient;Family member/caregiver   Family Member Consulted wife-[name]      Patient will benefit from skilled therapeutic intervention in order to improve the following deficits and impairments:  Abnormal gait, Decreased endurance, Impaired sensation, Decreased knowledge of use of DME, Decreased strength, Impaired UE functional use, Decreased mobility, Decreased balance, Decreased range of motion, Decreased cognition, Impaired flexibility, Postural dysfunction  Visit Diagnosis: Hemiplegia and hemiparesis following cerebral infarction affecting left dominant side (HCC)  Muscle weakness (generalized)  Other lack of coordination     Problem List Patient Active Problem List   Diagnosis Date Noted  . Alcohol use disorder, severe, dependence (HCC) 02/15/2016  . Muscle contusion 02/11/2016  . Left flaccid hemiparesis (HCC)   . Muscle spasticity   . Poor appetite   . Hyperlipidemia 01/14/2016  . Depression 01/14/2016  . Obesity 01/14/2016  . Hyperglycemia 01/14/2016  . Urinary tract infection, site not specified 01/14/2016  . Basal ganglia hemorrhage (HCC) 01/14/2016  . Hemiparesis affecting nondominant side as late effect of  cerebrovascular accident (HCC)   . Cognitive deficit, post-stroke   . Left hemiparesis (HCC)   . Essential hypertension   . Dysphagia, post-stroke   . Gait disturbance, post-stroke   . Hyponatremia   . Thrombocytopenia (HCC)   . Hemorrhagic stroke (HCC) R basal ganglia d/t HTN 01/08/2016    Jorje Guild, PT, DPT [facility] [address] [place], [place], [address] Phone: [phone]   Fax:  [phone] 04/22/16, 10:06 AM  Name: Jon [name] MRN: [id number] Date of Birth: 1955-11-25

## 2016-04-22 NOTE — Patient Instructions (Addendum)
Bear Roots on One Leg - Modified    Do this exercise with your brace on. Stand with both hands on a countertop, elbows bent, palms lightly resting on counter. Shift weight onto one foot. Lift unweighted foot, holding for 1-2 seconds, then slowly lower. Switch to lifting other foot. [name], provide hands-on assistance the whole time Gala RomneyDoug is on his feet. Repeat _15__ times each side.  Copyright  VHI. All rights reserved.

## 2016-04-22 NOTE — Therapy (Signed)
[facility] [address] [place], [place], [address] Phone: [phone]   Fax:  [phone]  Occupational Therapy Treatment  Patient Details  Name: Jon [name] MRN: [id number] Date of Birth: 1955/08/27 Referring Provider: Dr Claudette LawsAndrew Kirsteins  Encounter Date: 04/22/2016    Past Medical History:  Diagnosis Date  . Hypertension   . Liver damage   . Stroke [facility](HCC)     Past Surgical History:  Procedure Laterality Date  . TONSILLECTOMY AND ADENOIDECTOMY Bilateral    Age 60    There were no vitals filed for this visit.      Subjective Assessment - 04/22/16 0807    Subjective  Pt denies pain   Patient Stated Goals Get my left arm going again and have more control of my left foot.   Currently in Pain? No/denies            Pt was instructed in som basic self ROM exercises,he required significant cueing to stay on task and not to get distracted. P/ROM shoulder flexion, biceps curls  and abduction in supine. Seated edge of mat, weight bearing through elbow on elevated surface while performing cross body functional reaching with RUE for increased left side awareness, trunk contol and weight bearing, mod/ max facilitation/ v.c.                  OT Education - 04/22/16 1453    Education provided Yes   Education Details supine self ROM shoulder flexion(clasp wrist) and seated supination/ pronation, finger flexion extension pt self ROM   Person(s) Educated Patient;Spouse   Methods Explanation;Demonstration;Verbal cues;Handout   Comprehension Verbalized understanding;Returned demonstration;Verbal cues required          OT Short Term Goals - 04/04/16 1306      OT SHORT TERM GOAL #1   Title Pt will I state precautions/signs and symptoms of CVA   Baseline VC's   Time 4   Period Weeks   Status New     OT SHORT TERM GOAL #2   Title Pt will demonstrate Mod I HEP for LUE self ROM    Baseline VC's  and tc's   Time 4   Period Weeks   Status New     OT SHORT TERM GOAL #3   Title Pt will I state 2-3 edema control techniques LUE    Baseline VC's/Min A   Time 4   Period Weeks   Status New           OT Long Term Goals - 04/04/16 1307      OT LONG TERM GOAL #1   Title Pt will be Mod I upgrade HEP LUE   Time 8   Period Weeks   Status New     OT LONG TERM GOAL #2   Title Pt will be Mod I simple snack prep in ADL kitchen (using manual w/c vs hemi walker).   Time 8   Period Weeks   Status New     OT LONG TERM GOAL #3   Title Pt will be Mod I basic ADL transfers using hemi walker    Time 8   Period Weeks   Status New     OT LONG TERM GOAL #4   Title Pt will be supervision LB dressing w/ a/e or DME PRN, using 1 handed technique   Time 8   Period Weeks   Status New     OT LONG TERM GOAL #5   Title  Pt will be supervision level simulated bathing UB/LB using 1 handed technique   Time 8   Period Weeks   Status New               Plan - 04/22/16 1451    Clinical Impression Statement Pt is progressing slowly towards goals limited by severity of deficits.   Rehab Potential Fair   OT Frequency 2x / week   OT Duration 8 weeks   OT Treatment/Interventions Self-care/ADL training;Therapeutic exercise;DME and/or AE instruction;Building services engineer;Therapeutic activities;Patient/family education;Splinting;Neuromuscular education;Electrical Stimulation;Passive range of motion;Therapeutic exercises;Visual/perceptual remediation/compensation   Plan review P/ROM LUE with pt/ wife, body on arm activities in seated for left side awareness   Consulted and Agree with Plan of Care Patient;Family member/caregiver      Patient will benefit from skilled therapeutic intervention in order to improve the following deficits and impairments:  Abnormal gait, Decreased balance, Decreased endurance, Difficulty walking, Impaired sensation, Impaired vision/preception, Pain, Increased  edema, Decreased range of motion, Decreased cognition, Decreased activity tolerance, Decreased coordination, Decreased knowledge of use of DME, Decreased safety awareness, Decreased strength, Impaired UE functional use  Visit Diagnosis: Muscle weakness (generalized)  Other lack of coordination  Hemiplegia and hemiparesis following cerebral infarction affecting left dominant side [facility])    Problem List Patient Active Problem List   Diagnosis Date Noted  . Alcohol use disorder, severe, dependence (HCC) 02/15/2016  . Muscle contusion 02/11/2016  . Left flaccid hemiparesis (HCC)   . Muscle spasticity   . Poor appetite   . Hyperlipidemia 01/14/2016  . Depression 01/14/2016  . Obesity 01/14/2016  . Hyperglycemia 01/14/2016  . Urinary tract infection, site not specified 01/14/2016  . Basal ganglia hemorrhage (HCC) 01/14/2016  . Hemiparesis affecting nondominant side as late effect of cerebrovascular accident (HCC)   . Cognitive deficit, post-stroke   . Left hemiparesis (HCC)   . Essential hypertension   . Dysphagia, post-stroke   . Gait disturbance, post-stroke   . Hyponatremia   . Thrombocytopenia (HCC)   . Hemorrhagic stroke (HCC) R basal ganglia d/t HTN 01/08/2016    Alydia Gosser 04/22/2016, 2:54 PM  [facility] [address] [place], [place], [address] Phone: [phone]   Fax:  [phone]  Name: Jon [name] MRN: [id number] Date of Birth: 02-05-1956

## 2016-04-22 NOTE — Patient Instructions (Signed)
Laying on your back clasp left hand at the wrist raise arms up to shoulder height with elbow straight within pain free range 10-20 reps 2x day  Seated bend and straighten the fingers of the left hand with your right hand 10-20 reps 2x day  Hold the wrist of your left hand with right hand turn palm up and down, 10-20 reps 2x day

## 2016-04-25 ENCOUNTER — Encounter: Payer: Self-pay | Admitting: *Deleted

## 2016-04-25 ENCOUNTER — Ambulatory Visit: Payer: 59 | Attending: Physical Medicine & Rehabilitation | Admitting: Physical Therapy

## 2016-04-25 ENCOUNTER — Ambulatory Visit: Payer: 59 | Admitting: *Deleted

## 2016-04-25 DIAGNOSIS — R41841 Cognitive communication deficit: Secondary | ICD-10-CM | POA: Diagnosis present

## 2016-04-25 DIAGNOSIS — I69318 Other symptoms and signs involving cognitive functions following cerebral infarction: Secondary | ICD-10-CM | POA: Insufficient documentation

## 2016-04-25 DIAGNOSIS — R278 Other lack of coordination: Secondary | ICD-10-CM

## 2016-04-25 DIAGNOSIS — I69352 Hemiplegia and hemiparesis following cerebral infarction affecting left dominant side: Secondary | ICD-10-CM | POA: Insufficient documentation

## 2016-04-25 DIAGNOSIS — M6281 Muscle weakness (generalized): Secondary | ICD-10-CM

## 2016-04-25 DIAGNOSIS — R471 Dysarthria and anarthria: Secondary | ICD-10-CM | POA: Insufficient documentation

## 2016-04-25 NOTE — Therapy (Signed)
[facility] [address] [place], [place], [address] Phone: [phone]   Fax:  [phone]  Occupational Therapy Treatment  Patient Details  Name: Jon [name] MRN: [id number] Date of Birth: 1955/08/19 Referring Provider: Dr Claudette LawsAndrew Kirsteins  Encounter Date: 04/25/2016      OT End of Session - 04/25/16 1304    Visit Number 5   Number of Visits 12   Date for OT Re-Evaluation 05/30/16   Authorization Type UHC [facility]([employer]) & medicare ? - G code and progress note every 10th visit   OT Start Time [phone]   OT Stop Time 0935   OT Time Calculation (min) 45 min   Activity Tolerance Patient tolerated treatment well   Behavior During Therapy [facility] for tasks assessed/performed      Past Medical History:  Diagnosis Date  . Hypertension   . Liver damage   . Stroke [facility](HCC)     Past Surgical History:  Procedure Laterality Date  . TONSILLECTOMY AND ADENOIDECTOMY Bilateral    Age 60    There were no vitals filed for this visit.      Subjective Assessment - 04/25/16 0858    Subjective  Pt reports some left shoulder pain, rating 1/10. "Soreness"   Patient is accompained by: Family member   Patient Stated Goals Get my left arm going again and have more control of my left foot.   Currently in Pain? Yes   Pain Score 1    Pain Location Arm   Pain Orientation Left   Multiple Pain Sites No                      OT Treatments/Exercises (OP) - 04/25/16 0001      Transfers   Sit to Stand 4: Min guard   Stand to Sit 4: Min guard   Stand to Sit Details Blocked left knee; cuing for safety     Neurological Re-education Exercises   Other Exercises 1 Reviewed basic self ROM exercises w/ vc's. P/ROM shoulder flexion LUE, bicep curls, forearm pronation/supination; wrist flexion and extension. Pt with tight flexors noted. Seated at edge of mat for weight bearing on elevated surface through elbow and left  hand/wrist/elbow with elbow supported; Trunk control and WB with mod-max facilitation and verbal cuing.     Weight Bearing Technique   Weight Bearing Technique Yes  See neuro muscular re-education above                OT Education - 04/25/16 1302    Education provided Yes   Education Details Reviewed supine self ROM shoulder/clasp wrist and seated supination/pronation. Stressed importance of perrformance of HEP w/ Pt/and spouse.   Person(s) Educated Spouse;Patient   Methods Demonstration;Explanation;Tactile cues;Verbal cues;Handout   Comprehension Verbalized understanding;Verbal cues required;Tactile cues required          OT Short Term Goals - 04/04/16 1306      OT SHORT TERM GOAL #1   Title Pt will I state precautions/signs and symptoms of CVA   Baseline VC's   Time 4   Period Weeks   Status New     OT SHORT TERM GOAL #2   Title Pt will demonstrate Mod I HEP for LUE self ROM    Baseline VC's and tc's   Time 4   Period Weeks   Status New     OT SHORT TERM GOAL #3   Title Pt will I state 2-3 edema control  techniques LUE    Baseline VC's/Min A   Time 4   Period Weeks   Status New           OT Long Term Goals - 04/04/16 1307      OT LONG TERM GOAL #1   Title Pt will be Mod I upgrade HEP LUE   Time 8   Period Weeks   Status New     OT LONG TERM GOAL #2   Title Pt will be Mod I simple snack prep in ADL kitchen (using manual w/c vs hemi walker).   Time 8   Period Weeks   Status New     OT LONG TERM GOAL #3   Title Pt will be Mod I basic ADL transfers using hemi walker    Time 8   Period Weeks   Status New     OT LONG TERM GOAL #4   Title Pt will be supervision LB dressing w/ a/e or DME PRN, using 1 handed technique   Time 8   Period Weeks   Status New     OT LONG TERM GOAL #5   Title Pt will be supervision level simulated bathing UB/LB using 1 handed technique   Time 8   Period Weeks   Status New               Plan - 04/25/16  1305    Clinical Impression Statement Pt slowly progressing toward POC/goals secondary to cognitive deficits and carryover.   Rehab Potential Fair   OT Frequency 2x / week   OT Duration 8 weeks   OT Treatment/Interventions Self-care/ADL training;Therapeutic exercise;DME and/or AE instruction;Building services engineer;Therapeutic activities;Patient/family education;Splinting;Neuromuscular education;Electrical Stimulation;Passive range of motion;Therapeutic exercises;Visual/perceptual remediation/compensation   Plan Left side awareness; PROM LUE w/ pt wife.    Consulted and Agree with Plan of Care Patient;Family member/caregiver   Family Member Consulted Spouse      Patient will benefit from skilled therapeutic intervention in order to improve the following deficits and impairments:  Abnormal gait, Decreased balance, Decreased endurance, Difficulty walking, Impaired sensation, Impaired vision/preception, Pain, Increased edema, Decreased range of motion, Decreased cognition, Decreased activity tolerance, Decreased coordination, Decreased knowledge of use of DME, Decreased safety awareness, Decreased strength, Impaired UE functional use  Visit Diagnosis: Muscle weakness (generalized)  Hemiplegia and hemiparesis following cerebral infarction affecting left dominant side (HCC)  Other lack of coordination  Cognitive communication deficit    Problem List Patient Active Problem List   Diagnosis Date Noted  . Alcohol use disorder, severe, dependence (HCC) 02/15/2016  . Muscle contusion 02/11/2016  . Left flaccid hemiparesis   . Muscle spasticity   . Poor appetite   . Hyperlipidemia 01/14/2016  . Depression 01/14/2016  . Obesity 01/14/2016  . Hyperglycemia 01/14/2016  . Urinary tract infection, site not specified 01/14/2016  . Basal ganglia hemorrhage (HCC) 01/14/2016  . Hemiparesis affecting nondominant side as late effect of cerebrovascular accident (HCC)   . Cognitive deficit,  post-stroke   . Left hemiparesis (HCC)   . Essential hypertension   . Dysphagia, post-stroke   . Gait disturbance, post-stroke   . Hyponatremia   . Thrombocytopenia (HCC)   . Hemorrhagic stroke (HCC) R basal ganglia d/t HTN 01/08/2016    Danarius Mcconathy Dionicio Stall, OTR/L 04/25/2016, 1:14 PM  [facility] [address] [place], [place], [address] Phone: [phone]   Fax:  [phone]  Name: Jon [name] MRN: [id number] Date of Birth: 19-Apr-1956

## 2016-04-25 NOTE — Therapy (Signed)
[facility] [address] [place], [place], [address] Phone: [phone]   Fax:  [phone]  Physical Therapy Treatment  Patient Details  Name: Jon CoolerGeorge D [name] MRN: [id number] Date of Birth: [birth year] Referring Provider: Dr. Wynn BankerKirsteins  Encounter Date: 04/25/2016      PT End of Session - 04/25/16 1314    Visit Number 7   Number of Visits 17   Date for PT Re-Evaluation 06/04/16   Authorization Type UHC and Medicare? G-CODE AND PROGRESS NOTE EVERY 10TH VISIT.    PT Start Time [phone]   PT Stop Time 1015   PT Time Calculation (min) 44 min   Equipment Utilized During Treatment Gait belt   Activity Tolerance Patient tolerated treatment well   Behavior During Therapy WFL for tasks assessed/performed      Past Medical History:  Diagnosis Date  . Hypertension   . Liver damage   . Stroke [facility](HCC)     Past Surgical History:  Procedure Laterality Date  . TONSILLECTOMY AND ADENOIDECTOMY Bilateral    Age 60    There were no vitals filed for this visit.      Subjective Assessment - 04/25/16 0932    Subjective "No falls, medications are the same." Pt saw Dr. Wynn BankerKirsteins last week. Pt thinks appt went "kind of" well.    Patient is accompained by: Family member  wife, Jon [name]   Pertinent History Pt goes by [name].   PMH significant for: HTN, ETOH abuse per pt's wife but pt has not consumed alcohol since ICH, liver damage   Patient Stated Goals Control ankle movement better and improve placement of the foot.    Currently in Pain? No/denies                         [facility] Adult PT Treatment/Exercise - 04/25/16 1308      Transfers   Transfers Sit to Stand;Stand to Sit   Sit to Stand 5: Supervision   Sit to Stand Details (indicate cue type and reason) cueing for foot placement, setup, hand placement with inconsistent within-session carryover   Stand to Sit 5: Supervision   Stand to Sit Details cueing for safe  sequencing of turning, retro stepping with RW   Comments Blocked practice of sit <> stand with mirror anterior to pt for visual feedback, R foot on 2" step to promote LLE WB, multimodal cueing (mostly visual, tactile) to promote midline orientation during transitional movement, LLE WB.     Ambulation/Gait   Ambulation/Gait Yes   Ambulation/Gait Assistance 4: Min guard   Ambulation/Gait Assistance Details Gait [phone] [id number]' with hemi walker and min guard; noted excessive lateral trunk lean to R side, minimal LLE WB. Gait [id number]' with RW + L h/o with noted improvement in midline orientation, symmetry of posture and LE WB   Ambulation Distance (Feet) 230 Feet   Assistive device Rolling walker;Other (Comment);Hemi-walker  L hand orthosis, L AFO   Gait Pattern Step-through pattern;Decreased stride length;Decreased dorsiflexion - left;Decreased hip/knee flexion - left;Decreased weight shift to left;Decreased stance time - left;Left flexed knee in stance;Lateral trunk lean to right;Trunk rotated posteriorly on left;Narrow base of support;Poor foot clearance - left   Ambulation Surface Level;Indoor                PT Education - 04/25/16 1311    Education provided Yes   Education Details Recommending RW with L hand orthosis for mobility to promote midline orientation, active L trunk/LUE,  and LLE WB. Emphasized midline/symmetry during sit <> stand.   Person(s) Educated Patient;Spouse   Methods Explanation;Demonstration;Verbal cues   Comprehension Verbalized understanding;Returned demonstration          PT Short Term Goals - 04/06/16 1256      PT SHORT TERM GOAL #1   Title Pt will verbalize understanding of risk factors and signs/sx's of CVA in order to reduce risk of addtional CVA. TARGET DATE FOR ALL STGS: 05/03/16   Status On-going     PT SHORT TERM GOAL #2   Title Perform BERG and write goal prn.    Baseline 9/13: Berg score = 9/56   Status Achieved     PT SHORT TERM GOAL #3   Title Pt  will amb. 200' with LRAD and S over even terrain to improve functional mobility.    Status On-going     PT SHORT TERM GOAL #4   Title Pt will improve gait speed with LRAD to >/=0.53ft/sec. to improve functional mobility.    Status On-going     PT SHORT TERM GOAL #5   Title Pt will improve Berg score from 9/56 to 13/56 to indicate improved standing balance.    Status New           PT Long Term Goals - 04/06/16 1303      PT LONG TERM GOAL #1   Title Pt will be IND in HEP to improve flexibility, strength, and balance. TARGET DATE FOR ALL LTGS: 05/31/16   Status On-going     PT LONG TERM GOAL #2   Title Pt will improve gait speed to >/=1.68ft/sec with LRAD to decr. falls risk.    Status On-going     PT LONG TERM GOAL #3   Title Pt will amb. 500' over even and paved surfaces at with LRAD at MOD I level to improve functional mobility.    Status On-going     PT LONG TERM GOAL #4   Title Pt will report zero falls over the last 4 weeks to improve safety during functional mobility.    Status On-going     PT LONG TERM GOAL #5   Title Pt will improve Berg score from 9/56 to 17/56 to indicate improved standing balance.    Status New               Plan - 04/25/16 1315    Clinical Impression Statement Session focused on gait training and transfer training with emphasis on midline orientation and symmetry of LE placement/WB. Pt tolerated interventions well and endurance appears to be improving; however, pt will require further reinforcement.    Rehab Potential Good   Clinical Impairments Affecting Rehab Potential co-morbidities   PT Frequency 2x / week   PT Duration 8 weeks   PT Treatment/Interventions ADLs/Self Care Home Management;Biofeedback;Canalith Repostioning;Electrical Stimulation;Cognitive remediation;Neuromuscular re-education;Balance training;Therapeutic exercise;Therapeutic activities;Functional mobility training;Stair training;Gait training;DME Instruction;Orthotic  Fit/Training;Patient/family education;Wheelchair mobility training;Manual techniques;Vestibular   PT Next Visit Plan Continue gait with L hand orthosis on RW to normalize postural alignment and gait pattern, decrease spasticity, increase LUE/LLE WB during ambulation.   PT Home Exercise Plan Strengthening and stretching HEP. 9/29: provided handout for weight shifting at countertop (wife safe to provide assist with this)   Consulted and Agree with Plan of Care Patient;Family member/caregiver   Family Member Consulted wife-[name]      Patient will benefit from skilled therapeutic intervention in order to improve the following deficits and impairments:  Abnormal gait, Decreased endurance, Impaired sensation,  Decreased knowledge of use of DME, Decreased strength, Impaired UE functional use, Decreased mobility, Decreased balance, Decreased range of motion, Decreased cognition, Impaired flexibility, Postural dysfunction  Visit Diagnosis: Hemiplegia and hemiparesis following cerebral infarction affecting left dominant side (HCC)  Other lack of coordination  Muscle weakness (generalized)     Problem List Patient Active Problem List   Diagnosis Date Noted  . Alcohol use disorder, severe, dependence (HCC) 02/15/2016  . Muscle contusion 02/11/2016  . Left flaccid hemiparesis   . Muscle spasticity   . Poor appetite   . Hyperlipidemia 01/14/2016  . Depression 01/14/2016  . Obesity 01/14/2016  . Hyperglycemia 01/14/2016  . Urinary tract infection, site not specified 01/14/2016  . Basal ganglia hemorrhage (HCC) 01/14/2016  . Hemiparesis affecting nondominant side as late effect of cerebrovascular accident (HCC)   . Cognitive deficit, post-stroke   . Left hemiparesis (HCC)   . Essential hypertension   . Dysphagia, post-stroke   . Gait disturbance, post-stroke   . Hyponatremia   . Thrombocytopenia (HCC)   . Hemorrhagic stroke (HCC) R basal ganglia d/t HTN 01/08/2016    Calvert Cantor 04/25/2016, 1:18 PM  [facility] [address] [place], [place], [address] Phone: [phone]   Fax:  [phone]  Name: JOHN WILLIAMSEN MRN: [id number] Date of Birth: Jun 14, 1956

## 2016-04-26 ENCOUNTER — Telehealth: Payer: Self-pay | Admitting: Neurology

## 2016-04-26 NOTE — Telephone Encounter (Signed)
Hey Dr. Roda ShuttersXu I am working on Jon [name]'s MRI. [facility]RA [id number] but did not approve the MRI [id number]. The insurance company came back and said that they are denying it and that we can write a appeal request asking them to reconsider their decision. This process takes up to 30 days. He is scheduled for our mobile unit for tomorrow. How would you like to proceed this?

## 2016-04-26 NOTE — Telephone Encounter (Signed)
He needs both MRI and MRA for sure. We can appeal for that. They should approve them. Do we need to reschedule the MRI and MRA? Thanks.   Marvel PlanJindong Cayley Pester, MD PhD Stroke Neurology 04/26/2016 5:58 PM

## 2016-04-27 ENCOUNTER — Ambulatory Visit: Payer: 59 | Admitting: Physical Therapy

## 2016-04-27 ENCOUNTER — Ambulatory Visit (INDEPENDENT_AMBULATORY_CARE_PROVIDER_SITE_OTHER): Payer: 59

## 2016-04-27 ENCOUNTER — Ambulatory Visit: Payer: 59

## 2016-04-27 ENCOUNTER — Ambulatory Visit: Payer: 59 | Admitting: *Deleted

## 2016-04-27 ENCOUNTER — Encounter: Payer: Self-pay | Admitting: *Deleted

## 2016-04-27 DIAGNOSIS — M6281 Muscle weakness (generalized): Secondary | ICD-10-CM

## 2016-04-27 DIAGNOSIS — R278 Other lack of coordination: Secondary | ICD-10-CM

## 2016-04-27 DIAGNOSIS — I69352 Hemiplegia and hemiparesis following cerebral infarction affecting left dominant side: Secondary | ICD-10-CM | POA: Diagnosis not present

## 2016-04-27 DIAGNOSIS — R41841 Cognitive communication deficit: Secondary | ICD-10-CM

## 2016-04-27 DIAGNOSIS — I619 Nontraumatic intracerebral hemorrhage, unspecified: Secondary | ICD-10-CM

## 2016-04-27 NOTE — Telephone Encounter (Signed)
He can go ahead and have the MRA today since he is already scheduled while we are awaiting approval on the MRI, is that ok?

## 2016-04-27 NOTE — Therapy (Signed)
[facility] [address] [place], [place], [address] Phone: [phone]   Fax:  [phone]  Physical Therapy Treatment  Patient Details  Name: Jon [name] MRN: [id number] Date of Birth: 29-Apr-1956 Referring Provider: Dr. Wynn Banker  Encounter Date: 04/27/2016      PT End of Session - 04/27/16 1958    Visit Number 8   Number of Visits 17   Date for PT Re-Evaluation 06/04/16   Authorization Type UHC and Medicare? G-CODE AND PROGRESS NOTE EVERY 10TH VISIT.    PT Start Time [phone]   PT Stop Time 1016   PT Time Calculation (min) 39 min   Equipment Utilized During Treatment Gait belt   Activity Tolerance Patient tolerated treatment well   Behavior During Therapy WFL for tasks assessed/performed      Past Medical History:  Diagnosis Date  . Hypertension   . Liver damage   . Stroke [facility])     Past Surgical History:  Procedure Laterality Date  . TONSILLECTOMY AND ADENOIDECTOMY Bilateral    Age 60    There were no vitals filed for this visit.      Subjective Assessment - 04/27/16 0940    Subjective Pt arrived to session reporting he had bought a rolling walker yesterday at the thrift store.   Patient is accompained by: Family member  wife, Jon [name]   Pertinent History Pt goes by Jon [name]".   PMH significant for: HTN, ETOH abuse per pt's wife but pt has not consumed alcohol since ICH, liver damage   Patient Stated Goals Control ankle movement better and improve placement of the foot.    Currently in Pain? No/denies                         [facility] Adult PT Treatment/Exercise - 04/27/16 1950      Bed Mobility   Bed Mobility Supine to Sit;Sit to Supine;Scooting to HOB   Supine to Sit 5: Supervision   Sit to Supine 6: Modified independent (Device/Increase time)   Scooting to [facility] 4: Min assist   Scooting to [facility] Details (indicate cue type and reason) manual facilitation of L knee flexion, manual  stabilization of LLE position (due to inattention of LLE during supine bridging to scoot), and tactile cueing at L knee for increased WB.     Transfers   Transfers Sit to Stand;Stand to Sit   Sit to Stand 5: Supervision   Sit to Stand Details (indicate cue type and reason) to RW   Stand to Sit 5: Supervision   Stand to Sit Details with RW   Stand Pivot Transfers 4: Min guard   Transfer Cueing min guard for stability/balance while pt managing LUE in L h/o     Ambulation/Gait   Ambulation/Gait Yes   Ambulation/Gait Assistance 5: Supervision;4: Min guard   Ambulation/Gait Assistance Details Gait [phone] then x50' over level, indoor surfaces with personal RW, L h/o (borrowed from clinic) and L AFO. Subtle to min cueing provided for neutral trunk rotation (especially when turning to L).    Ambulation Distance (Feet) 165 Feet  total   Assistive device Rolling walker;Other (Comment)  L hand orthosis; L AFO   Gait Pattern Step-through pattern;Decreased stride length;Decreased dorsiflexion - left;Decreased hip/knee flexion - left;Decreased weight shift to left;Decreased stance time - left;Left flexed knee in stance;Lateral trunk lean to right;Trunk rotated posteriorly on left;Narrow base of support;Poor foot clearance - left   Ambulation Surface Level;Indoor  Self-Care   Self-Care Other Self-Care Comments   Other Self-Care Comments  Modified personal RW height to facilitate more normalized gait pattern and to maximize LUE WB during ambulation. Added tennis balls to posterior legs of RW to facilitate safety with door sill negotiation and ambulation over surface changes. Spoke with primary OT regarding pt need for L hand orthosis on RW. Also contacted orthotist who provided L AFO in hospital to inquire about leather toe cap on L shoe. Provided wife with orthotist's contact info and recommended wife schedule appt for leather toe cap placement (which will be of no cost to patient).     Neuro Re-ed     Neuro Re-ed Details  In R sidelying, pt performed LLE flexion/extension AAROM on powder board x25 reps per direction with emphasis on selective control of L hip extension with concurrent knee flexion.  increased time required for transitional movements                  PT Short Term Goals - 04/06/16 1256      PT SHORT TERM GOAL #1   Title Pt will verbalize understanding of risk factors and signs/sx's of CVA in order to reduce risk of addtional CVA. TARGET DATE FOR ALL STGS: 05/03/16   Status On-going     PT SHORT TERM GOAL #2   Title Perform BERG and write goal prn.    Baseline 9/13: Berg score = 9/56   Status Achieved     PT SHORT TERM GOAL #3   Title Pt will amb. 200' with LRAD and S over even terrain to improve functional mobility.    Status On-going     PT SHORT TERM GOAL #4   Title Pt will improve gait speed with LRAD to >/=0.76ft/sec. to improve functional mobility.    Status On-going     PT SHORT TERM GOAL #5   Title Pt will improve Berg score from 9/56 to 13/56 to indicate improved standing balance.    Status New           PT Long Term Goals - 04/06/16 1303      PT LONG TERM GOAL #1   Title Pt will be IND in HEP to improve flexibility, strength, and balance. TARGET DATE FOR ALL LTGS: 05/31/16   Status On-going     PT LONG TERM GOAL #2   Title Pt will improve gait speed to >/=1.52ft/sec with LRAD to decr. falls risk.    Status On-going     PT LONG TERM GOAL #3   Title Pt will amb. 500' over even and paved surfaces at with LRAD at MOD I level to improve functional mobility.    Status On-going     PT LONG TERM GOAL #4   Title Pt will report zero falls over the last 4 weeks to improve safety during functional mobility.    Status On-going     PT LONG TERM GOAL #5   Title Pt will improve Berg score from 9/56 to 17/56 to indicate improved standing balance.    Status New               Plan - 04/27/16 2001    Clinical Impression Statement  Session focused on fitting new personal RW, continued gait training, on LLE NMR, and on facilitating pt contact with orthotist to obtain leather toe cap on L shoe. Pt required min A for supine bridging to scoot due to decreased attention to/functional use of LLE.  Rehab Potential Good   Clinical Impairments Affecting Rehab Potential co-morbidities   PT Frequency 2x / week   PT Duration 8 weeks   PT Treatment/Interventions ADLs/Self Care Home Management;Biofeedback;Canalith Repostioning;Electrical Stimulation;Cognitive remediation;Neuromuscular re-education;Balance training;Therapeutic exercise;Therapeutic activities;Functional mobility training;Stair training;Gait training;DME Instruction;Orthotic Fit/Training;Patient/family education;Wheelchair mobility training;Manual techniques;Vestibular   PT Next Visit Plan Begin checking STG's. Continue L NMR, increased attention to L side during mobility, increased LLE WB during transitional movements, and gait with L hand orthosis on RW. Consider adding supine bridging to HEP.   PT Home Exercise Plan Strengthening and stretching HEP. 9/29: provided handout for weight shifting at countertop (wife safe to provide assist with this)   Consulted and Agree with Plan of Care Patient;Family member/caregiver   Family Member Consulted wife-Jon [name]      Patient will benefit from skilled therapeutic intervention in order to improve the following deficits and impairments:  Abnormal gait, Decreased endurance, Impaired sensation, Decreased knowledge of use of DME, Decreased strength, Impaired UE functional use, Decreased mobility, Decreased balance, Decreased range of motion, Decreased cognition, Impaired flexibility, Postural dysfunction  Visit Diagnosis: Hemiplegia and hemiparesis following cerebral infarction affecting left dominant side (HCC)  Muscle weakness (generalized)  Other lack of coordination     Problem List Patient Active Problem List   Diagnosis  Date Noted  . Alcohol use disorder, severe, dependence (HCC) 02/15/2016  . Muscle contusion 02/11/2016  . Left flaccid hemiparesis   . Muscle spasticity   . Poor appetite   . Hyperlipidemia 01/14/2016  . Depression 01/14/2016  . Obesity 01/14/2016  . Hyperglycemia 01/14/2016  . Urinary tract infection, site not specified 01/14/2016  . Basal ganglia hemorrhage (HCC) 01/14/2016  . Hemiparesis affecting nondominant side as late effect of cerebrovascular accident (HCC)   . Cognitive deficit, post-stroke   . Left hemiparesis (HCC)   . Essential hypertension   . Dysphagia, post-stroke   . Gait disturbance, post-stroke   . Hyponatremia   . Thrombocytopenia (HCC)   . Hemorrhagic stroke (HCC) R basal ganglia d/t HTN 01/08/2016   Jon [name], PT, DPT [facility] [address] [place], [place], [address] Phone: [phone]   Fax:  [phone] 04/27/16, 8:05 PM  Name: Jon [name] MRN: [id number] Date of Birth: 10/23/1955

## 2016-04-27 NOTE — Telephone Encounter (Signed)
Jon [name] Jon [name], Dr. Roda ShuttersXu would like to start the appeal process on this patient.

## 2016-04-27 NOTE — Telephone Encounter (Signed)
Yes, let's go ahead for the appeal. Thanks.   Marvel PlanJindong Manisha Cancel, MD PhD Stroke Neurology 04/27/2016 4:51 PM

## 2016-04-27 NOTE — Therapy (Signed)
[facility] [address] [place], [place], [address] Phone: [phone]   Fax:  [phone]  Occupational Therapy Treatment  Patient Details  Name: Jon [name] MRN: [id number] Date of Birth: Aug 08, 1955 Referring Provider: Dr Claudette LawsAndrew Kirsteins  Encounter Date: 04/27/2016      OT End of Session - 04/27/16 1228    Visit Number 6   Number of Visits 12   Date for OT Re-Evaluation 05/30/16   Authorization Type UHC [facility]([employer]) & medicare ? - G code and progress note every 10th visit   Authorization - Visit Number 6   Authorization - Number of Visits 10   OT Start Time [phone]   OT Stop Time 0931   OT Time Calculation (min) 45 min   Activity Tolerance Patient tolerated treatment well   Behavior During Therapy [facility] for tasks assessed/performed      Past Medical History:  Diagnosis Date  . Hypertension   . Liver damage   . Stroke [facility](HCC)     Past Surgical History:  Procedure Laterality Date  . TONSILLECTOMY AND ADENOIDECTOMY Bilateral    Age 60    There were no vitals filed for this visit.      Subjective Assessment - 04/27/16 0854    Subjective  Pt denies pain this morning.   Patient is accompained by: Family member   Patient Stated Goals Get my left arm going again and have more control of my left foot.   Currently in Pain? No/denies                      OT Treatments/Exercises (OP) - 04/27/16 0001      ADLs   Overall ADLs Pt reports that he "can" dress himself, "but it takes a long time, so she does it" Stressed importance of self care and reminded pt that he can dress himself if seated while using a/e PRN. Reiterated that repetetion with allow for increased independence and ultimately will not take him as long, if he cont to practice.      Neurological Re-education Exercises   Scapular Stabilization Left;15 reps   Shoulder Flexion PROM;Self ROM;Left   Shoulder ABduction PROM;Self  ROM;Left   Shoulder Horizontal ABduction PROM;Self ROM;Left   Elbow Flexion PROM;Self ROM;Left   Elbow Extension PROM;Self ROM;Left   Forearm Supination PROM;Self ROM;Left   Wrist Flexion PROM;Left   Wrist Extension PROM;Left   Finger Flexion Left hand focus on finger flexion and composite fist. After Passive stretch = 80% ability to make passive fist   Finger Extension Full extension   Other Exercises 1 UE Ranger for LUE for passive stretch, cross body techs/crossing midline. WB techniques while seated at edge of mat, trunk control, weight shifting and left sided awareness.    Other Exercises 2 Discussed and demonstrated self range of motion at home while seated at table, placing towel on surface and RUE as assist. Pt reports only doing HEP 1x/day. Stressed importance of carryover of HEP as pt has h/o pain in left shoulder and not performing exercises "if my arm hurts"                OT Education - 04/27/16 1225    Education provided Yes   Education Details Stressed importance of carryover/performance of HEP for LUE and selfcare/ADL's as pt reports/demonstrates decreased carryover.   Person(s) Educated Patient;Spouse   Methods Explanation;Demonstration;Tactile cues;Verbal cues   Comprehension Verbalized understanding;Returned demonstration;Need further instruction  OT Short Term Goals - 04/04/16 1306      OT SHORT TERM GOAL #1   Title Pt will I state precautions/signs and symptoms of CVA   Baseline VC's   Time 4   Period Weeks   Status New     OT SHORT TERM GOAL #2   Title Pt will demonstrate Mod I HEP for LUE self ROM    Baseline VC's and tc's   Time 4   Period Weeks   Status New     OT SHORT TERM GOAL #3   Title Pt will I state 2-3 edema control techniques LUE    Baseline VC's/Min A   Time 4   Period Weeks   Status New           OT Long Term Goals - 04/04/16 1307      OT LONG TERM GOAL #1   Title Pt will be Mod I upgrade HEP LUE   Time 8    Period Weeks   Status New     OT LONG TERM GOAL #2   Title Pt will be Mod I simple snack prep in ADL kitchen (using manual w/c vs hemi walker).   Time 8   Period Weeks   Status New     OT LONG TERM GOAL #3   Title Pt will be Mod I basic ADL transfers using hemi walker    Time 8   Period Weeks   Status New     OT LONG TERM GOAL #4   Title Pt will be supervision LB dressing w/ a/e or DME PRN, using 1 handed technique   Time 8   Period Weeks   Status New     OT LONG TERM GOAL #5   Title Pt will be supervision level simulated bathing UB/LB using 1 handed technique   Time 8   Period Weeks   Status New               Plan - 04/27/16 1229    Clinical Impression Statement Pt cont to progress slowly toward POC/goals secondary to cognitive deficits and carryover.   Rehab Potential Fair   OT Frequency 2x / week   OT Duration 8 weeks   OT Treatment/Interventions Self-care/ADL training;Therapeutic exercise;DME and/or AE instruction;Building services engineer;Therapeutic activities;Patient/family education;Splinting;Neuromuscular education;Electrical Stimulation;Passive range of motion;Therapeutic exercises;Visual/perceptual remediation/compensation   Plan L hand orthosis for RW to assist with midline orientation and L sided awareness. Review HEP   Consulted and Agree with Plan of Care Patient;Family member/caregiver   Family Member Consulted Spouse      Patient will benefit from skilled therapeutic intervention in order to improve the following deficits and impairments:  Abnormal gait, Decreased balance, Decreased endurance, Difficulty walking, Impaired sensation, Impaired vision/preception, Pain, Increased edema, Decreased range of motion, Decreased cognition, Decreased activity tolerance, Decreased coordination, Decreased knowledge of use of DME, Decreased safety awareness, Decreased strength, Impaired UE functional use  Visit Diagnosis: Muscle weakness  (generalized)  Hemiplegia and hemiparesis following cerebral infarction affecting left dominant side (HCC)  Other lack of coordination  Cognitive communication deficit    Problem List Patient Active Problem List   Diagnosis Date Noted  . Alcohol use disorder, severe, dependence (HCC) 02/15/2016  . Muscle contusion 02/11/2016  . Left flaccid hemiparesis   . Muscle spasticity   . Poor appetite   . Hyperlipidemia 01/14/2016  . Depression 01/14/2016  . Obesity 01/14/2016  . Hyperglycemia 01/14/2016  . Urinary tract infection, site not specified 01/14/2016  .  Basal ganglia hemorrhage (HCC) 01/14/2016  . Hemiparesis affecting nondominant side as late effect of cerebrovascular accident (HCC)   . Cognitive deficit, post-stroke   . Left hemiparesis (HCC)   . Essential hypertension   . Dysphagia, post-stroke   . Gait disturbance, post-stroke   . Hyponatremia   . Thrombocytopenia (HCC)   . Hemorrhagic stroke (HCC) R basal ganglia d/t HTN 01/08/2016    Ercia Crisafulli Dionicio Stall, OTR/L 04/27/2016, 12:32 PM  [facility] [address] [place], [place], [address] Phone: [phone]   Fax:  [phone]  Name: Jon [name] MRN: [id number] Date of Birth: [birth year]

## 2016-04-28 ENCOUNTER — Telehealth: Payer: Self-pay

## 2016-04-28 ENCOUNTER — Telehealth: Payer: Self-pay | Admitting: Physical Medicine & Rehabilitation

## 2016-04-28 NOTE — Telephone Encounter (Signed)
[facility] pharmacy is calling to let us know that there is a 80 mg a day maximum for Baclofen and patient is taking 3 pills a day 3 times a day.  Please call them at [phone].

## 2016-04-28 NOTE — Telephone Encounter (Signed)
Please call in baclofen 20 mg 1 by mouth 4 times a day #120 with one refill

## 2016-04-28 NOTE — Telephone Encounter (Signed)
Fax request from amlodipine, sertraline, pravastatin, and pantoprazole must be refilled by patient PCP Elias Elseobert Reade, Md. Was filled by Mariam Dollaraniel Angiulli ,PA as a courtesy refill.

## 2016-04-29 MED ORDER — BACLOFEN 20 MG PO TABS: 20.0000 mg | ORAL_TABLET | Freq: Four times a day (QID) | ORAL | 1 refills | Status: DC

## 2016-05-02 ENCOUNTER — Telehealth: Payer: Self-pay

## 2016-05-02 NOTE — Telephone Encounter (Signed)
-----   Message from Marvel PlanJindong Xu, MD sent at 04/29/2016 12:50 PM EDT ----- Could you please let the patient know that the MRA head test done recently in our office was negative for aneurysm or vessel malformation. We are working on the approval for the MRI brain. Will schedule the test once it is approved. Thanks.  Marvel PlanJindong Xu, MD PhD Stroke Neurology 04/29/2016 12:50 PM

## 2016-05-02 NOTE — Telephone Encounter (Signed)
Rn spoke with patients wife Jon [name] on dpr about MRA. Rn stated The MRA head was negative for any aneurysm or vessel malformation. Rn stated the referral staff is working on the MRI approval. Pts wife verbalized understanding. ------

## 2016-05-03 ENCOUNTER — Encounter: Payer: Self-pay | Admitting: *Deleted

## 2016-05-03 ENCOUNTER — Ambulatory Visit: Payer: Self-pay

## 2016-05-05 ENCOUNTER — Ambulatory Visit: Payer: 59

## 2016-05-05 ENCOUNTER — Ambulatory Visit: Payer: 59 | Admitting: *Deleted

## 2016-05-05 DIAGNOSIS — I69352 Hemiplegia and hemiparesis following cerebral infarction affecting left dominant side: Secondary | ICD-10-CM | POA: Diagnosis not present

## 2016-05-05 DIAGNOSIS — R278 Other lack of coordination: Secondary | ICD-10-CM

## 2016-05-05 DIAGNOSIS — R41841 Cognitive communication deficit: Secondary | ICD-10-CM

## 2016-05-05 DIAGNOSIS — M6281 Muscle weakness (generalized): Secondary | ICD-10-CM

## 2016-05-05 NOTE — Therapy (Signed)
[facility] [address] [place], [place], [address] Phone: [phone]   Fax:  [phone]  Occupational Therapy Treatment  Patient Details  Name: Jon [name] MRN: [id number] Date of Birth: 01/20/1956 Referring Provider: Dr Claudette Laws  Encounter Date: 05/05/2016      OT End of Session - 05/05/16 1211    Visit Number 7   Number of Visits 12   Date for OT Re-Evaluation 05/30/16   Authorization Type UHC [facility]) & medicare ? - G code and progress note every 10th visit   Authorization - Visit Number 7   Authorization - Number of Visits 10   OT Start Time 1104   OT Stop Time 1152   OT Time Calculation (min) 48 min   Activity Tolerance Patient tolerated treatment well   Behavior During Therapy WFL for tasks assessed/performed      Past Medical History:  Diagnosis Date  . Hypertension   . Liver damage   . Stroke [facility])     Past Surgical History:  Procedure Laterality Date  . TONSILLECTOMY AND ADENOIDECTOMY Bilateral    Age 60    There were no vitals filed for this visit.                    OT Treatments/Exercises (OP) - 05/05/16 0001      Transfers   Sit to Stand 5: Supervision   Sit to Stand Details (indicate cue type and reason) to RW; Min A to place L hand into hemi splint on RW.   Stand to Sit 5: Supervision   Stand to Sit Details Supervision, Min A to remove L hand from RW hemi splint      ADLs   Overall ADLs Pt states that he has difficulty dressing using 1 handed techniques and that "She does it" ON:GEXBMW. Reminded pt that he can do 1 handed dressing and that it will take increased time but that he can perform with increased ease after increased repetetion and practice.. Reviewed and had pt practice 1 handed tech's sitting in wheelchair, crossing left LE over R knee as pt reported that he couldn't don/doff underwear of pants. Pt was able to simulate don/doff after  repeated/review of ADL's. Cognition appears to be a factor impacting carryover w/ ADL's     Neurological Re-education Exercises   Shoulder Flexion PROM;Self ROM;Left;10 reps;Seated  Seated in w/c at table top   Shoulder ABduction PROM;Self ROM;Left;10 reps;Seated  Seated in w/c at table top   Shoulder Protraction PROM;Self ROM;Left;10 reps;Seated;Other (comment)  seated at tabletop   Shoulder Horizontal ABduction PROM;Self ROM;Left;10 reps;Seated  Seated at table top   Other Exercises 1 All exercises performed at table top today for self ranging; Min-mod vc's and tc's for carryover and hold for stretch as well as range in different planes. Cognition appears to be limiting factor w/ carry voer for HEP.   Other Exercises 2 CVA prevention and warning signs/symptoms issued and reviewed with pt/spouse in clinic today.     Splinting   Splinting LUE hand splint issued for L RW for LUE support to encourage trunk control/midline while ambulating. Pt/spouse were educated in use of hemi splint and handout was issued for care.      Stroke Prevention Some medical conditions and behaviors are associated with an increased chance of having a stroke. You may prevent a stroke by making healthy choices and managing medical conditions. HOW CAN I REDUCE MY RISK OF HAVING  A STROKE?   Stay physically active. Get at least 30 minutes of activity on most or all days.  Do not smoke. It may also be helpful to avoid exposure to secondhand smoke.  Limit alcohol use. Moderate alcohol use is considered to be:  No more than 2 drinks per day for men.  No more than 1 drink per day for nonpregnant women.  Eat healthy foods. This involves:  Eating 5 or more servings of fruits and vegetables a day.  Making dietary changes that address high blood pressure (hypertension), high cholesterol, diabetes, or obesity.  Manage your cholesterol levels.  Making food choices that are high in fiber and low in saturated fat,  trans fat, and cholesterol may control cholesterol levels.  Take any prescribed medicines to control cholesterol as directed by your health care provider.  Manage your diabetes.  Controlling your carbohydrate and sugar intake is recommended to manage diabetes.  Take any prescribed medicines to control diabetes as directed by your health care provider.  Control your hypertension.  Making food choices that are low in salt (sodium), saturated fat, trans fat, and cholesterol is recommended to manage hypertension.  Ask your health care provider if you need treatment to lower your blood pressure. Take any prescribed medicines to control hypertension as directed by your health care provider.  If you are 13-95 years of age, have your blood pressure checked every 3-5 years. If you are 33 years of age or older, have your blood pressure checked every year.  Maintain a healthy weight.  Reducing calorie intake and making food choices that are low in sodium, saturated fat, trans fat, and cholesterol are recommended to manage weight.  Stop drug abuse.  Avoid taking birth control pills.  Talk to your health care provider about the risks of taking birth control pills if you are over 77 years old, smoke, get migraines, or have ever had a blood clot.  Get evaluated for sleep disorders (sleep apnea).  Talk to your health care provider about getting a sleep evaluation if you snore a lot or have excessive sleepiness.  Take medicines only as directed by your health care provider.  For some people, aspirin or blood thinners (anticoagulants) are helpful in reducing the risk of forming abnormal blood clots that can lead to stroke. If you have the irregular heart rhythm of atrial fibrillation, you should be on a blood thinner unless there is a good reason you cannot take them.  Understand all your medicine instructions.  Make sure that other conditions (such as anemia or atherosclerosis) are  addressed. SEEK IMMEDIATE MEDICAL CARE IF:   You have sudden weakness or numbness of the face, arm, or leg, especially on one side of the body.  Your face or eyelid droops to one side.  You have sudden confusion.  You have trouble speaking (aphasia) or understanding.  You have sudden trouble seeing in one or both eyes.  You have sudden trouble walking.  You have dizziness.  You have a loss of balance or coordination.  You have a sudden, severe headache with no known cause.  You have new chest pain or an irregular heartbeat. Any of these symptoms may represent a serious problem that is an emergency. Do not wait to see if the symptoms will go away. Get medical help at once. Call your local emergency services (911 in [place] Do not drive yourself to the hospital.   This information is not intended to replace advice given to you by your  health care provider. Make sure you discuss any questions you have with your health care provider.   Document Released: 08/18/2004 Document Revised: 08/01/2014 Document Reviewed: 01/11/2013 Elsevier Interactive Patient Education 2016 Elsevier Inc.   Table Geologist, engineering (Static)    Sitting at table, use right arm to assist with sliding left arm across table. Hold _10_ seconds. Repeat _10_ times, alternating position/planes on tabletop. Do 3-4 sessions per day. Do this in different directions and make sure you hold for a stretch.           OT Education - 05/05/16 1156    Education provided Yes   Education Details Stressed importance of carryover and performance of HEP for LUE and self care/hemi dressing techniques. Issued and reviewed CVA prevention/warning signs and symptoms as well as table top stretches for LUE.   Person(s) Educated Patient;Spouse   Methods Explanation;Demonstration;Tactile cues;Verbal cues;Handout   Comprehension Verbalized understanding;Returned demonstration;Need further instruction          OT Short Term Goals -  05/05/16 1220      OT SHORT TERM GOAL #1   Title Pt will I state precautions/signs and symptoms of CVA   Baseline VC's   Time 4   Period Weeks   Status Achieved     OT SHORT TERM GOAL #2   Title Pt will demonstrate Mod I HEP for LUE self ROM    Baseline VC's and tc's   Time 4   Period Weeks   Status On-going     OT SHORT TERM GOAL #3   Title Pt will I state 2-3 edema control techniques LUE    Baseline VC's/Min A   Status Deferred           OT Long Term Goals - 05/05/16 1221      OT LONG TERM GOAL #1   Title Pt will be Mod I upgrade HEP LUE   Time 8   Period Weeks   Status New     OT LONG TERM GOAL #2   Title Pt will be Mod I simple snack prep in ADL kitchen (using manual w/c vs hemi walker).   Time 8   Period Weeks   Status New     OT LONG TERM GOAL #3   Title Pt will be Mod I basic ADL transfers using hemi walker    Time 8   Period Weeks   Status New     OT LONG TERM GOAL #4   Title Pt will be supervision LB dressing w/ a/e or DME PRN, using 1 handed technique   Time 8   Period Weeks   Status New     OT LONG TERM GOAL #5   Title Pt will be supervision level simulated bathing UB/LB using 1 handed technique   Time 8   Period Weeks   Status New               Plan - 05/05/16 1213    Clinical Impression Statement Cognitive deficits appear to be limiting factor for carryover of ADL's and HEP for LUE. L hand hemi splint for RW should assist with better positoining of LUE and trunk during functional mobility for transfers.   Rehab Potential Fair   OT Frequency 2x / week   OT Duration 8 weeks   OT Treatment/Interventions Self-care/ADL training;Therapeutic exercise;DME and/or AE instruction;Building services engineer;Therapeutic activities;Patient/family education;Splinting;Neuromuscular education;Electrical Stimulation;Passive range of motion;Therapeutic exercises;Visual/perceptual remediation/compensation   Plan Review table top LUE HEP; 1 handed LB  dressing (?  focus on underwear and pants) seated in wheelchair.    Consulted and Agree with Plan of Care Patient;Family member/caregiver   Family Member Consulted Spouse      Patient will benefit from skilled therapeutic intervention in order to improve the following deficits and impairments:  Abnormal gait, Decreased balance, Decreased endurance, Difficulty walking, Impaired sensation, Impaired vision/preception, Pain, Increased edema, Decreased range of motion, Decreased cognition, Decreased activity tolerance, Decreased coordination, Decreased knowledge of use of DME, Decreased safety awareness, Decreased strength, Impaired UE functional use  Visit Diagnosis: Hemiplegia and hemiparesis following cerebral infarction affecting left dominant side (HCC)  Other lack of coordination  Cognitive communication deficit  Muscle weakness (generalized)    Problem List Patient Active Problem List   Diagnosis Date Noted  . Alcohol use disorder, severe, dependence (HCC) 02/15/2016  . Muscle contusion 02/11/2016  . Left flaccid hemiparesis   . Muscle spasticity   . Poor appetite   . Hyperlipidemia 01/14/2016  . Depression 01/14/2016  . Obesity 01/14/2016  . Hyperglycemia 01/14/2016  . Urinary tract infection, site not specified 01/14/2016  . Basal ganglia hemorrhage (HCC) 01/14/2016  . Hemiparesis affecting nondominant side as late effect of cerebrovascular accident (HCC)   . Cognitive deficit, post-stroke   . Left hemiparesis (HCC)   . Essential hypertension   . Dysphagia, post-stroke   . Gait disturbance, post-stroke   . Hyponatremia   . Thrombocytopenia (HCC)   . Hemorrhagic stroke (HCC) R basal ganglia d/t HTN 01/08/2016    Barnhill, Amy Dionicio StallBeth Dixon, OTR/L 05/05/2016, 12:23 PM  [facility] [address] [place], [place], [address] Phone: [phone]   Fax:  [phone]  Name: Jon [name] MRN: [id number] Date of  Birth: [birth year]

## 2016-05-05 NOTE — Therapy (Addendum)
[facility] [address] [place], [place], [address] Phone: [phone]   Fax:  [phone]  Physical Therapy Treatment  Patient Details  Name: Jon [name] MRN: [id number] Date of Birth: Aug 09, 1955 Referring Provider: Dr. Letta Pate  Encounter Date: 05/05/2016      PT End of Session - 05/05/16 1236    Visit Number 9   Number of Visits 17   Date for PT Re-Evaluation 06/04/16   Authorization Type UHC and Medicare? G-CODE AND PROGRESS NOTE EVERY 10TH VISIT.    PT Start Time 1019   PT Stop Time 1101   PT Time Calculation (min) 42 min   Equipment Utilized During Treatment Gait belt   Activity Tolerance Patient tolerated treatment well   Behavior During Therapy WFL for tasks assessed/performed      Past Medical History:  Diagnosis Date  . Hypertension   . Liver damage   . Stroke [facility])     Past Surgical History:  Procedure Laterality Date  . TONSILLECTOMY AND ADENOIDECTOMY Bilateral    Age 46    There were no vitals filed for this visit.      Subjective Assessment - 05/05/16 1023    Subjective Pt denied falls or changes since last visit. Pt reported he had a MRA last week and was notified it was negative.   Patient is accompained by: Family member   Pertinent History Pt goes by [name].   PMH significant for: HTN, ETOH abuse per pt's wife but pt has not consumed alcohol since ICH, liver damage   Patient Stated Goals Control ankle movement better and improve placement of the foot.    Currently in Pain? No/denies                         [facility] Adult PT Treatment/Exercise - 05/05/16 1027      Ambulation/Gait   Ambulation/Gait Yes   Ambulation/Gait Assistance 5: Supervision;4: Min guard   Ambulation/Gait Assistance Details Pt. amb. with L h/o on RW and simulated toe cap on L shoe. Cues to attend to task, improve L toe clearance, decr. L trunk/hip ER during L swing phase of gait 2/2 difficulty  clearing L toes.    Ambulation Distance (Feet) 50 Feet  20'   Assistive device Rolling walker;Other (Comment)  L h/o   Gait Pattern Step-through pattern;Decreased stride length;Decreased dorsiflexion - left;Decreased hip/knee flexion - left;Decreased weight shift to left;Decreased stance time - left;Left flexed knee in stance;Lateral trunk lean to right;Trunk rotated posteriorly on left;Narrow base of support;Poor foot clearance - left   Ambulation Surface Level;Indoor   Gait velocity 0.54f/sec.  pt was distracted due to busy enviroment     Standardized Balance Assessment   Standardized Balance Assessment Berg Balance Test     Berg Balance Test   Sit to Stand Able to stand using hands after several tries   Standing Unsupported Needs several tries to stand 30 seconds unsupported  needs help to place feet apart   Sitting with Back Unsupported but Feet Supported on Floor or Stool Able to sit safely and securely 2 minutes   Stand to Sit Controls descent by using hands   Transfers Able to transfer with verbal cueing and /or supervision   Standing Unsupported with Eyes Closed Able to stand 10 seconds with supervision   Standing Ubsupported with Feet Together Able to place feet together independently and stand for 1 minute with supervision   From Standing, Reach Forward  with Outstretched Arm Can reach forward >12 cm safely (5")  6.5"   From Standing Position, Pick up Object from [place] to pick up shoe, needs supervision   From Standing Position, Turn to Look Behind Over each Shoulder Turn sideways only but maintains balance   Turn 360 Degrees Needs assistance while turning   Standing Unsupported, Alternately Place Feet on Step/Stool Needs assistance to keep from falling or unable to try   Standing Unsupported, One Foot in [place] to take small step independently and hold 30 seconds   Standing on One Leg Tries to lift leg/unable to hold 3 seconds but remains standing independently    Total Score 29           Self Care:     PT Education - 05/05/16 1236    Education provided Yes   Education Details PT discussed goal progress and outcome measure results. PT educated pt that OT would fit L h/o on pt's RW, and pt can keep the h/o on his RW at all times. PT also reiterated the importance of f/u with orthotist in order to obtain L leather toe cap, so pt can advance LLE in a safe and more efficient/effective manner. Pt's wife stated they have a f/u today.   Person(s) Educated Patient;Spouse   Methods Explanation   Comprehension Verbalized understanding          PT Short Term Goals - 05/05/16 1239      PT SHORT TERM GOAL #1   Title Pt will verbalize understanding of risk factors and signs/sx's of CVA in order to reduce risk of addtional CVA. TARGET DATE FOR ALL STGS: 05/03/16   Status Partially Met     PT SHORT TERM GOAL #2   Title Perform BERG and write goal prn.    Baseline 9/13: Berg score = 9/56   Status Achieved     PT SHORT TERM GOAL #3   Title Pt will amb. 200' with LRAD and S over even terrain to improve functional mobility.    Status On-going     PT SHORT TERM GOAL #4   Title Pt will improve gait speed with LRAD to >/=0.79f/sec. to improve functional mobility.    Status Not Met     PT SHORT TERM GOAL #5   Title Pt will improve Berg score from 9/56 to 13/56 to indicate improved standing balance.    Status Achieved           PT Long Term Goals - 05/05/16 1240      PT LONG TERM GOAL #1   Title Pt will be IND in HEP to improve flexibility, strength, and balance. TARGET DATE FOR ALL LTGS: 05/31/16   Status On-going     PT LONG TERM GOAL #2   Title Pt will improve gait speed to >/=1.836fsec with LRAD to decr. falls risk.    Status On-going     PT LONG TERM GOAL #3   Title Pt will amb. 500' over even and paved surfaces at with LRAD at MOD I level to improve functional mobility.    Status On-going     PT LONG TERM GOAL #4   Title Pt will  report zero falls over the last 4 weeks to improve safety during functional mobility.    Status On-going     PT LONG TERM GOAL #5   Title Pt will improve Berg score from 29/56 to 34/56 to indicate improved standing balance.    Baseline 29/56 on  05/05/16   Status Revised               Plan - 05/05/16 1237    Clinical Impression Statement Pt demonstrated progress as he met STG 5 and LTG 5, PT revised LTG BERG goal due to progress. Pt partially met STG 1, as he required minimal cues to state CVA risk factors and s/s. Pt did not meet STG 4, but was distraced 2/2 busy enviroment, will assess again next visit, along with STG 3 (not assessed 2/2 time constraints). Pt required frequent seated rest breaks during BERG testing 2/2 R knee pain and fatigue. Continue with POC.    Rehab Potential Good   Clinical Impairments Affecting Rehab Potential co-morbidities   PT Frequency 2x / week   PT Duration 8 weeks   PT Treatment/Interventions ADLs/Self Care Home Management;Biofeedback;Canalith Repostioning;Electrical Stimulation;Cognitive remediation;Neuromuscular re-education;Balance training;Therapeutic exercise;Therapeutic activities;Functional mobility training;Stair training;Gait training;DME Instruction;Orthotic Fit/Training;Patient/family education;Wheelchair mobility training;Manual techniques;Vestibular   PT Next Visit Plan G-CODE, as pt applying for Medicare. Assess STGs 3 and 4. Continue L NMR, increased attention to L side during mobility, increased LLE WB during transitional movements, and gait with L hand orthosis on RW. Consider adding supine bridging to HEP.   PT Home Exercise Plan Strengthening and stretching HEP. 9/29: provided handout for weight shifting at countertop (wife safe to provide assist with this)   Consulted and Agree with Plan of Care Patient;Family member/caregiver   Family Member Consulted wife-[name]      Patient will benefit from skilled therapeutic intervention in  order to improve the following deficits and impairments:  Abnormal gait, Decreased endurance, Impaired sensation, Decreased knowledge of use of DME, Decreased strength, Impaired UE functional use, Decreased mobility, Decreased balance, Decreased range of motion, Decreased cognition, Impaired flexibility, Postural dysfunction  Visit Diagnosis: Hemiplegia and hemiparesis following cerebral infarction affecting left dominant side ([place])  Other lack of coordination     Problem List Patient Active Problem List   Diagnosis Date Noted  . Alcohol use disorder, severe, dependence ([place]) 02/15/2016  . Muscle contusion 02/11/2016  . Left flaccid hemiparesis   . Muscle spasticity   . Poor appetite   . Hyperlipidemia 01/14/2016  . Depression 01/14/2016  . Obesity 01/14/2016  . Hyperglycemia 01/14/2016  . Urinary tract infection, site not specified 01/14/2016  . Basal ganglia hemorrhage ([place]) 01/14/2016  . Hemiparesis affecting nondominant side as late effect of cerebrovascular accident ([place])   . Cognitive deficit, post-stroke   . Left hemiparesis ([place])   . Essential hypertension   . Dysphagia, post-stroke   . Gait disturbance, post-stroke   . Hyponatremia   . Thrombocytopenia ([place])   . Hemorrhagic stroke ([place]) R basal ganglia d/t HTN 01/08/2016    Cree Kunert L 05/05/2016, 12:42 PM  [facility] [address] [place], [place], [address] Phone: [phone]   Fax:  [phone]  Name: REEVES MUSICK MRN: [id number] Date of Birth: Dec 11, 1955   Geoffry Paradise, PT,DPT 05/05/16 12:43 PM Phone: [phone] Fax: [phone]

## 2016-05-05 NOTE — Patient Instructions (Addendum)
Stroke Prevention Some medical conditions and behaviors are associated with an increased chance of having a stroke. You may prevent a stroke by making healthy choices and managing medical conditions. HOW CAN I REDUCE MY RISK OF HAVING A STROKE?   Stay physically active. Get at least 30 minutes of activity on most or all days.  Do not smoke. It may also be helpful to avoid exposure to secondhand smoke.  Limit alcohol use. Moderate alcohol use is considered to be:  No more than 2 drinks per day for men.  No more than 1 drink per day for nonpregnant women.  Eat healthy foods. This involves:  Eating 5 or more servings of fruits and vegetables a day.  Making dietary changes that address high blood pressure (hypertension), high cholesterol, diabetes, or obesity.  Manage your cholesterol levels.  Making food choices that are high in fiber and low in saturated fat, trans fat, and cholesterol may control cholesterol levels.  Take any prescribed medicines to control cholesterol as directed by your health care provider.  Manage your diabetes.  Controlling your carbohydrate and sugar intake is recommended to manage diabetes.  Take any prescribed medicines to control diabetes as directed by your health care provider.  Control your hypertension.  Making food choices that are low in salt (sodium), saturated fat, trans fat, and cholesterol is recommended to manage hypertension.  Ask your health care provider if you need treatment to lower your blood pressure. Take any prescribed medicines to control hypertension as directed by your health care provider.  If you are 18-39 years of age, have your blood pressure checked every 3-5 years. If you are 40 years of age or older, have your blood pressure checked every year.  Maintain a healthy weight.  Reducing calorie intake and making food choices that are low in sodium, saturated fat, trans fat, and cholesterol are recommended to manage  weight.  Stop drug abuse.  Avoid taking birth control pills.  Talk to your health care provider about the risks of taking birth control pills if you are over 35 years old, smoke, get migraines, or have ever had a blood clot.  Get evaluated for sleep disorders (sleep apnea).  Talk to your health care provider about getting a sleep evaluation if you snore a lot or have excessive sleepiness.  Take medicines only as directed by your health care provider.  For some people, aspirin or blood thinners (anticoagulants) are helpful in reducing the risk of forming abnormal blood clots that can lead to stroke. If you have the irregular heart rhythm of atrial fibrillation, you should be on a blood thinner unless there is a good reason you cannot take them.  Understand all your medicine instructions.  Make sure that other conditions (such as anemia or atherosclerosis) are addressed. SEEK IMMEDIATE MEDICAL CARE IF:   You have sudden weakness or numbness of the face, arm, or leg, especially on one side of the body.  Your face or eyelid droops to one side.  You have sudden confusion.  You have trouble speaking (aphasia) or understanding.  You have sudden trouble seeing in one or both eyes.  You have sudden trouble walking.  You have dizziness.  You have a loss of balance or coordination.  You have a sudden, severe headache with no known cause.  You have new chest pain or an irregular heartbeat. Any of these symptoms may represent a serious problem that is an emergency. Do not wait to see if the symptoms will   go away. Get medical help at once. Call your local emergency services (911 in [place] Do not drive yourself to the hospital.   This information is not intended to replace advice given to you by your health care provider. Make sure you discuss any questions you have with your health care provider.   Document Released: 08/18/2004 Document Revised: 08/01/2014 Document Reviewed:  01/11/2013 Elsevier Interactive Patient Education 2016 [employer]   Table SolicitorTop Stretch (Static)   . Sitting at table, use right arm to assist with sliding left arm across table. Hold _10_ seconds. Repeat _10_ times, alternating position/planes on tabletop. Do 3-4 sessions per day. Do this in different directions and make sure you hold for a stretch.  Copyright  VHI. All rights reserved.

## 2016-05-06 ENCOUNTER — Ambulatory Visit: Payer: 59 | Admitting: Physical Therapy

## 2016-05-06 ENCOUNTER — Ambulatory Visit: Payer: 59 | Admitting: Occupational Therapy

## 2016-05-06 DIAGNOSIS — R278 Other lack of coordination: Secondary | ICD-10-CM

## 2016-05-06 DIAGNOSIS — I69318 Other symptoms and signs involving cognitive functions following cerebral infarction: Secondary | ICD-10-CM

## 2016-05-06 DIAGNOSIS — I69352 Hemiplegia and hemiparesis following cerebral infarction affecting left dominant side: Secondary | ICD-10-CM | POA: Diagnosis not present

## 2016-05-06 DIAGNOSIS — M6281 Muscle weakness (generalized): Secondary | ICD-10-CM

## 2016-05-06 NOTE — Therapy (Signed)
[facility] [address] [place], [place], [address] Phone: [phone]   Fax:  [phone]  Physical Therapy Treatment  Patient Details  Name: Jon [name] MRN: [id number] Date of Birth: 08-Apr-1956 Referring Provider: Dr. Letta Pate  Encounter Date: 05/06/2016      PT End of Session - 05/06/16 1308    Visit Number 10   Number of Visits 17   Date for PT Re-Evaluation 06/04/16   Authorization Type UHC and Medicare? G-CODE AND PROGRESS NOTE EVERY 10TH VISIT.    PT Start Time 1018   PT Stop Time 1103   PT Time Calculation (min) 45 min   Equipment Utilized During Treatment Gait belt   Activity Tolerance Patient tolerated treatment well   Behavior During Therapy WFL for tasks assessed/performed      Past Medical History:  Diagnosis Date  . Hypertension   . Liver damage   . Stroke [facility])     Past Surgical History:  Procedure Laterality Date  . TONSILLECTOMY AND ADENOIDECTOMY Bilateral    Age 60    There were no vitals filed for this visit.      Subjective Assessment - 05/06/16 1307    Subjective Per wife, "We're having trouble with the butt-up exercises. He (patient) had back pain with it." Pt got leather toe cap on L shoe yesterday.   Patient is accompained by: Family member  wife, [name]   Pertinent History Pt goes by [name].   PMH significant for: HTN, ETOH abuse per pt's wife but pt has not consumed alcohol since ICH, liver damage   Patient Stated Goals Control ankle movement better and improve placement of the foot.    Currently in Pain? No/denies            [facility] PT Assessment - 05/06/16 0001      Timed Up and Go Test   TUG Normal TUG   Normal TUG (seconds) 82  with RW and L hand orthosis                     [facility] Adult PT Treatment/Exercise - 05/06/16 0001      Transfers   Transfers Sit to Stand;Stand to Sit   Sit to Stand 5: Supervision   Stand to Sit 5: Supervision      Ambulation/Gait   Ambulation/Gait Yes   Ambulation/Gait Assistance 5: Supervision   Ambulation Distance (Feet) 200 Feet   Assistive device Rolling walker;Other (Comment)  L hand orthosis, L AFO   Gait Pattern Step-through pattern;Decreased stride length;Decreased dorsiflexion - left;Decreased hip/knee flexion - left;Decreased weight shift to left;Decreased stance time - left;Left flexed knee in stance;Lateral trunk lean to right;Trunk rotated posteriorly on left;Poor foot clearance - left   Ambulation Surface Level;Indoor   Gait velocity .29 ft/sec     Exercises   Exercises Other Exercises   Other Exercises  Bridging x15 reps with gait belt around knees to promote proper alignment/LLE control. Attempted supine L heel slides x5 reps; however, pt with difficulty maintaining L foot contact with mat table; therefore, modified to supine, hook lying alternating LE marching x8 reps then x10 reps with cueing for control of LLE.             Balance Exercises - 05/06/16 1312      Balance Exercises: Standing   Standing Eyes Opened Wide (BOA);Head turns;Solid surface;5 reps;Other (comment)  horizontal/vertical turns x5 each           PT  Education - 05/06/16 1307    Education provided Yes   Education Details HEP modified; see Pt Instructions for details.   Person(s) Educated Patient;Spouse   Methods Explanation;Demonstration;Handout;Verbal cues   Comprehension Returned demonstration;Verbalized understanding          PT Short Term Goals - 05/06/16 1331      PT SHORT TERM GOAL #1   Title Pt will verbalize understanding of risk factors and signs/sx's of CVA in order to reduce risk of addtional CVA. TARGET DATE FOR ALL STGS: 05/03/16   Status Partially Met     PT SHORT TERM GOAL #2   Title Perform BERG and write goal prn.    Baseline 9/13: Berg score = 9/56   Status Achieved     PT SHORT TERM GOAL #3   Title Pt will amb. 200' with LRAD and S over even terrain to improve  functional mobility.    Baseline Met 10/13.   Status Achieved     PT SHORT TERM GOAL #4   Title Pt will improve gait speed with LRAD to >/=0.34f/sec. to improve functional mobility.    Baseline 10/13: gait velocity with RW and L hand orthosis = .38 ft/sec   Status Not Met     PT SHORT TERM GOAL #5   Title Pt will improve Berg score from 9/56 to 13/56 to indicate improved standing balance.    Status Achieved           PT Long Term Goals - 05/05/16 1240      PT LONG TERM GOAL #1   Title Pt will be IND in HEP to improve flexibility, strength, and balance. TARGET DATE FOR ALL LTGS: 05/31/16   Status On-going     PT LONG TERM GOAL #2   Title Pt will improve gait speed to >/=1.846fsec with LRAD to decr. falls risk.    Status On-going     PT LONG TERM GOAL #3   Title Pt will amb. 500' over even and paved surfaces at with LRAD at MOD I level to improve functional mobility.    Status On-going     PT LONG TERM GOAL #4   Title Pt will report zero falls over the last 4 weeks to improve safety during functional mobility.    Status On-going     PT LONG TERM GOAL #5   Title Pt will improve Berg score from 29/56 to 34/56 to indicate improved standing balance.    Baseline 29/56 on 05/05/16   Status Revised               Plan - 05/06/16 1348    Clinical Impression Statement Pt met STG addressing indoor gait x200' with supervision. STG for gait velocity not met. Although gait velocity using RW and L hand orthosis is less efficient, postural alignment, gait pattern, and symmetry of WB are much improved with use of RW as compared with hemi walker. Pt provided with updated HEP for bridging, L hip flexion AROM, and standing balance without UE use. Pt will continue to benefit from skilled outpatient PT to maximize stability/independence with functional mobility and decrease fall risk.    Rehab Potential Good   Clinical Impairments Affecting Rehab Potential co-morbidities   PT Frequency  2x / week   PT Duration 8 weeks   PT Treatment/Interventions ADLs/Self Care Home Management;Biofeedback;Canalith Repostioning;Electrical Stimulation;Cognitive remediation;Neuromuscular re-education;Balance training;Therapeutic exercise;Therapeutic activities;Functional mobility training;Stair training;Gait training;DME Instruction;Orthotic Fit/Training;Patient/family education;Wheelchair mobility training;Manual techniques;Vestibular   PT Next Visit Plan Continue L NMR,  increased attention to L side during mobility, increased LLE WB during transitional movements, standing balance without UE dependence, and gait with L hand orthosis on RW. Consider adding supine bridging to HEP.   PT Home Exercise Plan 05/11/2023: modified LLE strengthening HEP (as wife reporting low back pain with single limb bridging).    Consulted and Agree with Plan of Care Patient;Family member/caregiver   Family Member Consulted wife-[name]      Patient will benefit from skilled therapeutic intervention in order to improve the following deficits and impairments:  Abnormal gait, Decreased endurance, Impaired sensation, Decreased knowledge of use of DME, Decreased strength, Impaired UE functional use, Decreased mobility, Decreased balance, Decreased range of motion, Decreased cognition, Impaired flexibility, Postural dysfunction  Visit Diagnosis: Hemiplegia and hemiparesis following cerebral infarction affecting left dominant side (HCC)  Other lack of coordination  Muscle weakness (generalized)       G-Codes - 05-10-16 1309    Functional Assessment Tool Used Gait speed with RW and L hand orthosis: 0.11f/sec; TUG with rolling walker= 82 sec.   Functional Limitation Mobility: Walking and moving around   Mobility: Walking and Moving Around Current Status [phone] At least 60 percent but less than 80 percent impaired, limited or restricted   Mobility: Walking and Moving Around Goal Status [phone] At least 20 percent but less  than 40 percent impaired, limited or restricted      Problem List Patient Active Problem List   Diagnosis Date Noted  . Alcohol use disorder, severe, dependence ([place] 02/15/2016  . Muscle contusion 02/11/2016  . Left flaccid hemiparesis   . Muscle spasticity   . Poor appetite   . Hyperlipidemia 01/14/2016  . Depression 01/14/2016  . Obesity 01/14/2016  . Hyperglycemia 01/14/2016  . Urinary tract infection, site not specified 01/14/2016  . Basal ganglia hemorrhage ([place] 01/14/2016  . Hemiparesis affecting nondominant side as late effect of cerebrovascular accident ([place]   . Cognitive deficit, post-stroke   . Left hemiparesis ([place]   . Essential hypertension   . Dysphagia, post-stroke   . Gait disturbance, post-stroke   . Hyponatremia   . Thrombocytopenia ([place]   . Hemorrhagic stroke ([place] R basal ganglia d/t HTN 01/08/2016   Physical Therapy Progress Note  Dates of Reporting Period: 04/05/16 to 110-17-2017 Objective Reports of Subjective Statement: See above.  Objective Measurements: Berg = 29/56; gait velocity = 0.38 ft/sec  Goal Update: See above.  Plan: Continue per POC.  Reason Skilled Services are Required: To maximize stability/independence with functional mobility and decrease fall risk.    BBillie Ruddy PT, DPT [facility] [address]Phone: [phone]  Fax:  [phone] 17, 2017 1:54 PM  Name: GMANNY VITOLOMRN: [id number] of Birth: [birth year]

## 2016-05-06 NOTE — Patient Instructions (Addendum)
Bracing With Bridging (Hook-Lying)    Lie on your back with both knees bent, feet shoulder width apart. Loop a belt around the knees to keep left leg from falling out. Push through both legs to lift hips from bed. Hold for 1-2 seconds, then slowly lower to resting position. Repeat _15__ times. Do _2-3__ times a day.  Supine Marching    While lying on your back with both knees bent (as pictured), slowly raise/lower right then left leg from bed. Focus on controlling LEFT leg (don't let it fall out to the side). Perform 8-10 consecutive repetitions, focusing on quality of movement. Repeat __2-3__ times per day.  Feet Apart, Head Motion - Eyes Open    Stand with a stable chair (or walker) in front of you, bed or chair close behind you. With eyes open, feet apart, move head slowly: up and down 5 times; right to left 5 times. Repeat __2-3__ times per day.  Copyright  VHI. All rights reserved.

## 2016-05-06 NOTE — Therapy (Signed)
[facility] [address] [place], [place], [address] Phone: [phone]   Fax:  [phone]  Occupational Therapy Treatment  Patient Details  Name: Jon [name] MRN: [id number] Date of Birth: [birth year] Referring Provider: Dr Claudette LawsAndrew Kirsteins  Encounter Date: 05/06/2016      OT End of Session - 05/06/16 1640    Visit Number 8   Number of Visits 16   Date for OT Re-Evaluation 05/30/16   Authorization Type UHC [facility]([employer]) & medicare ? - G code and progress note every 10th visit   Authorization Time Period Need to confirm if pt has Medicare, [facility], pt only scheduled through first week of November   Authorization - Visit Number 8   Authorization - Number of Visits 10   OT Start Time 0935   OT Stop Time 1015   OT Time Calculation (min) 40 min   Activity Tolerance Patient tolerated treatment well   Behavior During Therapy WFL for tasks assessed/performed      Past Medical History:  Diagnosis Date  . Hypertension   . Liver damage   . Stroke [facility](HCC)     Past Surgical History:  Procedure Laterality Date  . TONSILLECTOMY AND ADENOIDECTOMY Bilateral    Age 60    There were no vitals filed for this visit.      Subjective Assessment - 05/06/16 0942    Subjective  Pt reports pain when he woke up today    Patient Stated Goals Get my left arm going again and have more control of my left foot.   Currently in Pain? Yes   Pain Score 3    Pain Location Arm   Pain Orientation Left   Pain Descriptors / Indicators Aching   Pain Onset More than a month ago   Aggravating Factors  laying flat on back   Pain Relieving Factors moving it   Multiple Pain Sites No          Treatment: Reviewed tableslides. Therapist encouraged pt to participate in hemi dsg today, he declined and says he will work on at home. Seated on mat, weightbearing through left elbow on box while performing dynamic  reaching with trunk rotation with RUE, max v.c. To sustain weightbearing. Weightbearing through tiltted stool while performing trunk A-P tilts mod/max facilitation                      OT Short Term Goals - 05/05/16 1220      OT SHORT TERM GOAL #1   Title Pt will I state precautions/signs and symptoms of CVA   Baseline VC's   Time 4   Period Weeks   Status Achieved     OT SHORT TERM GOAL #2   Title Pt will demonstrate Mod I HEP for LUE self ROM    Baseline VC's and tc's   Time 4   Period Weeks   Status On-going     OT SHORT TERM GOAL #3   Title Pt will I state 2-3 edema control techniques LUE    Baseline VC's/Min A   Status Deferred           OT Long Term Goals - 05/05/16 1221      OT LONG TERM GOAL #1   Title Pt will be Mod I upgrade HEP LUE   Time 8   Period Weeks   Status New     OT LONG TERM GOAL #2  Title Pt will be Mod I simple snack prep in ADL kitchen (using manual w/c vs hemi walker).   Time 8   Period Weeks   Status New     OT LONG TERM GOAL #3   Title Pt will be Mod I basic ADL transfers using hemi walker    Time 8   Period Weeks   Status New     OT LONG TERM GOAL #4   Title Pt will be supervision LB dressing w/ a/e or DME PRN, using 1 handed technique   Time 8   Period Weeks   Status New     OT LONG TERM GOAL #5   Title Pt will be supervision level simulated bathing UB/LB using 1 handed technique   Time 8   Period Weeks   Status New               Plan - 05/06/16 1635    Clinical Impression Statement Pt progress continues to be limited by cognition and decreased awareness.    Rehab Potential Fair   OT Frequency 2x / week   OT Duration 8 weeks   OT Treatment/Interventions Self-care/ADL training;Therapeutic exercise;DME and/or AE instruction;Building services engineer;Therapeutic activities;Patient/family education;Splinting;Neuromuscular education;Electrical Stimulation;Passive range of motion;Therapeutic  exercises;Visual/perceptual remediation/compensation   Plan weightbearing , neuro re-ed for LUE      Patient will benefit from skilled therapeutic intervention in order to improve the following deficits and impairments:  Abnormal gait, Decreased balance, Decreased endurance, Difficulty walking, Impaired sensation, Impaired vision/preception, Pain, Increased edema, Decreased range of motion, Decreased cognition, Decreased activity tolerance, Decreased coordination, Decreased knowledge of use of DME, Decreased safety awareness, Decreased strength, Impaired UE functional use  Visit Diagnosis: Hemiplegia and hemiparesis following cerebral infarction affecting left dominant side (HCC)  Other lack of coordination  Muscle weakness (generalized)  Other symptoms and signs involving cognitive functions following cerebral infarction    Problem List Patient Active Problem List   Diagnosis Date Noted  . Alcohol use disorder, severe, dependence (HCC) 02/15/2016  . Muscle contusion 02/11/2016  . Left flaccid hemiparesis   . Muscle spasticity   . Poor appetite   . Hyperlipidemia 01/14/2016  . Depression 01/14/2016  . Obesity 01/14/2016  . Hyperglycemia 01/14/2016  . Urinary tract infection, site not specified 01/14/2016  . Basal ganglia hemorrhage (HCC) 01/14/2016  . Hemiparesis affecting nondominant side as late effect of cerebrovascular accident (HCC)   . Cognitive deficit, post-stroke   . Left hemiparesis (HCC)   . Essential hypertension   . Dysphagia, post-stroke   . Gait disturbance, post-stroke   . Hyponatremia   . Thrombocytopenia (HCC)   . Hemorrhagic stroke (HCC) R basal ganglia d/t HTN 01/08/2016    Jon [name] 05/06/2016, 4:44 PM  [facility] [address] [place], [place], [address] Phone: [phone]   Fax:  [phone]  Name: Jon [name] MRN: [id number] Date of Birth: Jan 02, 1956

## 2016-05-10 ENCOUNTER — Encounter: Payer: Self-pay | Admitting: *Deleted

## 2016-05-10 ENCOUNTER — Ambulatory Visit: Payer: 59 | Admitting: *Deleted

## 2016-05-10 ENCOUNTER — Ambulatory Visit: Payer: 59

## 2016-05-10 DIAGNOSIS — I69352 Hemiplegia and hemiparesis following cerebral infarction affecting left dominant side: Secondary | ICD-10-CM

## 2016-05-10 DIAGNOSIS — I69318 Other symptoms and signs involving cognitive functions following cerebral infarction: Secondary | ICD-10-CM

## 2016-05-10 DIAGNOSIS — R278 Other lack of coordination: Secondary | ICD-10-CM

## 2016-05-10 DIAGNOSIS — M6281 Muscle weakness (generalized): Secondary | ICD-10-CM

## 2016-05-10 NOTE — Therapy (Signed)
[facility] [address] [place], [place], [address] Phone: [phone]   Fax:  [phone]  Physical Therapy Treatment  Patient Details  Name: Jon [name] MRN: [id number] Date of Birth: 05-Feb-1956 Referring Provider: Dr. Letta Pate  Encounter Date: 05/10/2016      PT End of Session - 05/10/16 1610    Visit Number 11   Number of Visits 17   Date for PT Re-Evaluation 06/04/16   Authorization Type UHC and Medicare? G-CODE AND PROGRESS NOTE EVERY 10TH VISIT.    Authorization - Visit Number 11   Authorization - Number of Visits 20   PT Start Time 0932   PT Stop Time 1015   PT Time Calculation (min) 43 min   Equipment Utilized During Treatment Gait belt   Activity Tolerance Patient tolerated treatment well   Behavior During Therapy WFL for tasks assessed/performed      Past Medical History:  Diagnosis Date  . Hypertension   . Liver damage   . Stroke [facility])     Past Surgical History:  Procedure Laterality Date  . TONSILLECTOMY AND ADENOIDECTOMY Bilateral    Age 60    There were no vitals filed for this visit.      Subjective Assessment - 05/10/16 0936    Subjective Pt denied falls. Pt was able to stand and wash his hands yesterday. Pt wants to walk more at home but doesn't have enough room. Pt is going to the park today, to see how far apart the benches and maybe go there walking. Pt has leather shoe cap on L shoe.    Pertinent History Pt goes by "[name]".   PMH significant for: HTN, ETOH abuse per pt's wife but pt has not consumed alcohol since ICH, liver damage   Patient Stated Goals Control ankle movement better and improve placement of the foot.    Currently in Pain? No/denies                         [facility] Adult PT Treatment/Exercise - 05/10/16 0940      Ambulation/Gait   Ambulation/Gait Yes   Ambulation/Gait Assistance 5: Supervision;4: Min guard;4: Min assist   Ambulation/Gait  Assistance Details Cues to decr. L hip ER, improve step length, LLE stance time, and foot placement.   Ambulation Distance (Feet) 105 Feet   and 100' inside 110' outside   Assistive device Rolling walker;Other (Comment)  with L h/o   Gait Pattern Step-through pattern;Decreased stride length;Decreased dorsiflexion - left;Decreased hip/knee flexion - left;Decreased weight shift to left;Decreased stance time - left;Left flexed knee in stance;Lateral trunk lean to right;Trunk rotated posteriorly on left;Poor foot clearance - left   Ambulation Surface Level;Unlevel;Indoor;Outdoor;Paved   Gait velocity 0.29f/sec.  with                 PT Education - 05/10/16 1608    Education provided Yes   Education Details PT educated pt on the importance of trialing amb. at home only for now, and depending on how he feels after today's visit (after outdoor amb.) pt may be able to trial amb. longer distances at local park. Pt's wife stated she will measure distances from bench to bench, PT stated pt should amb. no more than 719 to ensure safety over uneven terrain.    Person(s) Educated Patient;Spouse   Methods Explanation   Comprehension Verbalized understanding          PT Short Term Goals -  05/06/16 1331      PT SHORT TERM GOAL #1   Title Pt will verbalize understanding of risk factors and signs/sx's of CVA in order to reduce risk of addtional CVA. TARGET DATE FOR ALL STGS: 05/03/16   Status Partially Met     PT SHORT TERM GOAL #2   Title Perform BERG and write goal prn.    Baseline 9/13: Berg score = 9/56   Status Achieved     PT SHORT TERM GOAL #3   Title Pt will amb. 200' with LRAD and S over even terrain to improve functional mobility.    Baseline Met 10/13.   Status Achieved     PT SHORT TERM GOAL #4   Title Pt will improve gait speed with LRAD to >/=0.70f/sec. to improve functional mobility.    Baseline 10/13: gait velocity with RW and L hand orthosis = .38 ft/sec   Status Not  Met     PT SHORT TERM GOAL #5   Title Pt will improve Berg score from 9/56 to 13/56 to indicate improved standing balance.    Status Achieved           PT Long Term Goals - 05/05/16 1240      PT LONG TERM GOAL #1   Title Pt will be IND in HEP to improve flexibility, strength, and balance. TARGET DATE FOR ALL LTGS: 05/31/16   Status On-going     PT LONG TERM GOAL #2   Title Pt will improve gait speed to >/=1.837fsec with LRAD to decr. falls risk.    Status On-going     PT LONG TERM GOAL #3   Title Pt will amb. 500' over even and paved surfaces at with LRAD at MOD I level to improve functional mobility.    Status On-going     PT LONG TERM GOAL #4   Title Pt will report zero falls over the last 4 weeks to improve safety during functional mobility.    Status On-going     PT LONG TERM GOAL #5   Title Pt will improve Berg score from 29/56 to 34/56 to indicate improved standing balance.    Baseline 29/56 on 05/05/16   Status Revised               Plan - 05/10/16 1610    Clinical Impression Statement Pt demonstrated progress, as his gait speed improved with L AFO and L shoe cap donned. Pt was able to amb. outdoors over paved surfaces, but did require min A to maintain balance after approx. 110' of amb. 2/2 fatigue. PT requested wait until next session to amb. at the park, to ensure he was not too fatigued. PT will likely reduce frequency to 1x/week for remainder of pt care, as his insurance visits are limited to 20 PT visits per year. Continue with POC>    Rehab Potential Good   Clinical Impairments Affecting Rehab Potential co-morbidities   PT Frequency 2x / week   PT Duration 8 weeks   PT Treatment/Interventions ADLs/Self Care Home Management;Biofeedback;Canalith Repostioning;Electrical Stimulation;Cognitive remediation;Neuromuscular re-education;Balance training;Therapeutic exercise;Therapeutic activities;Functional mobility training;Stair training;Gait training;DME  Instruction;Orthotic Fit/Training;Patient/family education;Wheelchair mobility training;Manual techniques;Vestibular   PT Next Visit Plan Discuss reducing frequency to 1x/week due to 20 visit PT limit. Gait with RW and trial LBQC (per pt request), SLS to improve proprioception. Trial SciFit to improve aerobic endurance and LLE strength.   PT Home Exercise Plan 10/13: modified LLE strengthening HEP (as wife reporting low back pain with single  limb bridging).    Consulted and Agree with Plan of Care Patient;Family member/caregiver   Family Member Consulted wife-[name]      Patient will benefit from skilled therapeutic intervention in order to improve the following deficits and impairments:  Abnormal gait, Decreased endurance, Impaired sensation, Decreased knowledge of use of DME, Decreased strength, Impaired UE functional use, Decreased mobility, Decreased balance, Decreased range of motion, Decreased cognition, Impaired flexibility, Postural dysfunction  Visit Diagnosis: Hemiplegia and hemiparesis following cerebral infarction affecting left dominant side ([place])  Other lack of coordination     Problem List Patient Active Problem List   Diagnosis Date Noted  . Alcohol use disorder, severe, dependence ([place]) 02/15/2016  . Muscle contusion 02/11/2016  . Left flaccid hemiparesis   . Muscle spasticity   . Poor appetite   . Hyperlipidemia 01/14/2016  . Depression 01/14/2016  . Obesity 01/14/2016  . Hyperglycemia 01/14/2016  . Urinary tract infection, site not specified 01/14/2016  . Basal ganglia hemorrhage ([place]) 01/14/2016  . Hemiparesis affecting nondominant side as late effect of cerebrovascular accident ([place])   . Cognitive deficit, post-stroke   . Left hemiparesis ([place])   . Essential hypertension   . Dysphagia, post-stroke   . Gait disturbance, post-stroke   . Hyponatremia   . Thrombocytopenia ([place])   . Hemorrhagic stroke ([place]) R basal ganglia d/t HTN 01/08/2016     Izan Miron L 05/10/2016, 4:14 PM  [facility] [address] [place], [place], [address] Phone: [phone]   Fax:  [phone]  Name: Jon [name] MRN: [id number] Date of Birth: October 02, 1955   Geoffry Paradise, PT,DPT 05/10/16 4:15 PM Phone: [phone] Fax: [phone]

## 2016-05-10 NOTE — Therapy (Signed)
[facility] [address] [place], [place], [address] Phone: [phone]   Fax:  [phone]  Occupational Therapy Treatment  Patient Details  Name: Jon CoolerGeorge D Leaming MRN: [id number] Date of Birth: [birth year] Referring Provider: Dr Claudette LawsAndrew Kirsteins  Encounter Date: 05/10/2016      OT End of Session - 05/10/16 1134    Visit Number 9   Number of Visits 16   Date for OT Re-Evaluation 05/30/16   Authorization Type UHC [facility]([employer]) & medicare ? - G code and progress note every 10th visit   Authorization Time Period Need to confirm if pt has Medicare, [facility], pt only scheduled through first week of November   Authorization - Visit Number 9   Authorization - Number of Visits 10   OT Start Time 1017   OT Stop Time 1107   OT Time Calculation (min) 50 min   Activity Tolerance Patient tolerated treatment well   Behavior During Therapy WFL for tasks assessed/performed      Past Medical History:  Diagnosis Date  . Hypertension   . Liver damage   . Stroke [facility](HCC)     Past Surgical History:  Procedure Laterality Date  . TONSILLECTOMY AND ADENOIDECTOMY Bilateral    Age 5    There were no vitals filed for this visit.      Subjective Assessment - 05/10/16 1116    Subjective  Pt reports that he doesn't do HEP at home "Because our table is cluttered and I can't do them there" "Plus it hurts" Pt education that HEP is important for increased PROM and decreased pain with increased independence for ADL's.   Patient is accompained by: Family member   Patient Stated Goals Get my left arm going again and have more control of my left foot.   Currently in Pain? No/denies   Pain Score 0-No pain                      OT Treatments/Exercises (OP) - 05/10/16 0001      Transfers   Sit to Stand 5: Supervision;4: Min guard   Stand to Sit 5: Supervision;4: Min guard     ADLs   Overall ADLs Pt and  his spouse report that he is using hemi tech's occasionally "On days when we don't have somewhere else to be, b/c it takes so long" spouse assists other times.     Neurological Re-education Exercises   Scapular Stabilization Left;15 reps   Shoulder Flexion PROM;Left;10 reps   Shoulder ABduction PROM;Left;10 reps   Shoulder Protraction PROM;Left;10 reps   Shoulder Horizontal ABduction PROM;Left;10 reps   Elbow Flexion PROM;Left;5 reps   Elbow Extension PROM;Left;5 reps   Other Exercises 1 Reviewed HEP, pt is not performing at home due to cognition. Lack of carryover.   Other Exercises 2 UE ranger for UE  passive stretch to shoulder, cross body techniques, crossing midline. WB techniques  while seated on mat table, trunk control, weight shifting & L sided weakness.     Manual Therapy   Manual Therapy Soft tissue mobilization;Myofascial release   Myofascial Release Trigger point release and myofacial techniques LUE w/ focus on biceps and deltoid x10 min                OT Education - 05/10/16 1133    Education provided Yes   Education Details HEP review, importance of HEP for LUE; Hemi dressing techniques   Person(s) Educated  Patient;Spouse   Methods Explanation;Demonstration;Verbal cues   Comprehension Verbalized understanding;Returned demonstration          OT Short Term Goals - 05/05/16 1220      OT SHORT TERM GOAL #1   Title Pt will I state precautions/signs and symptoms of CVA   Baseline VC's   Time 4   Period Weeks   Status Achieved     OT SHORT TERM GOAL #2   Title Pt will demonstrate Mod I HEP for LUE self ROM    Baseline VC's and tc's   Time 4   Period Weeks   Status On-going     OT SHORT TERM GOAL #3   Title Pt will I state 2-3 edema control techniques LUE    Baseline VC's/Min A   Status Deferred           OT Long Term Goals - 05/05/16 1221      OT LONG TERM GOAL #1   Title Pt will be Mod I upgrade HEP LUE   Time 8   Period Weeks   Status  New     OT LONG TERM GOAL #2   Title Pt will be Mod I simple snack prep in ADL kitchen (using manual w/c vs hemi walker).   Time 8   Period Weeks   Status New     OT LONG TERM GOAL #3   Title Pt will be Mod I basic ADL transfers using hemi walker    Time 8   Period Weeks   Status New     OT LONG TERM GOAL #4   Title Pt will be supervision LB dressing w/ a/e or DME PRN, using 1 handed technique   Time 8   Period Weeks   Status New     OT LONG TERM GOAL #5   Title Pt will be supervision level simulated bathing UB/LB using 1 handed technique   Time 8   Period Weeks   Status New               Plan - 05/10/16 1135    Clinical Impression Statement Pt progress is limited by cognition and decreased awareness. Pt may benefit from change in frequency to conserve visits secondary to current cognitive limitations. Discussed with PT today, will discuss with family next visit.    Rehab Potential Fair   OT Frequency 2x / week   OT Duration 8 weeks   OT Treatment/Interventions Self-care/ADL training;Therapeutic exercise;DME and/or AE instruction;Building services engineer;Therapeutic activities;Patient/family education;Splinting;Neuromuscular education;Electrical Stimulation;Passive range of motion;Therapeutic exercises;Visual/perceptual remediation/compensation   Plan G-Code; ? change in frequency; neuro re-ed for LUE.   Consulted and Agree with Plan of Care Patient;Family member/caregiver   Family Member Consulted Spouse      Patient will benefit from skilled therapeutic intervention in order to improve the following deficits and impairments:  Abnormal gait, Decreased balance, Decreased endurance, Difficulty walking, Impaired sensation, Impaired vision/preception, Pain, Increased edema, Decreased range of motion, Decreased cognition, Decreased activity tolerance, Decreased coordination, Decreased knowledge of use of DME, Decreased safety awareness, Decreased strength, Impaired UE  functional use  Visit Diagnosis: Hemiplegia and hemiparesis following cerebral infarction affecting left dominant side (HCC)  Other lack of coordination  Muscle weakness (generalized)  Other symptoms and signs involving cognitive functions following cerebral infarction    Problem List Patient Active Problem List   Diagnosis Date Noted  . Alcohol use disorder, severe, dependence (H[facility]) 02/15/2016  . Muscle contusion 02/11/2016  . Left flaccid hemiparesis   .  Muscle spasticity   . Poor appetite   . Hyperlipidemia 01/14/2016  . Depression 01/14/2016  . Obesity 01/14/2016  . Hyperglycemia 01/14/2016  . Urinary tract infection, site not specified 01/14/2016  . Basal ganglia hemorrhage (HCC) 01/14/2016  . Hemiparesis affecting nondominant side as late effect of cerebrovascular accident (HCC)   . Cognitive deficit, post-stroke   . Left hemiparesis (HCC)   . Essential hypertension   . Dysphagia, post-stroke   . Gait disturbance, post-stroke   . Hyponatremia   . Thrombocytopenia (H[facility])   . Hemorrhagic stroke (HCC) R basal ganglia d/t HTN 01/08/2016    Gonsalo Cuthbertson Dionicio Stall, OTR/L 05/10/2016, 11:39 AM  [facility] [address] [place], [place], [address] Phone: [phone]   Fax:  [phone]  Name: RUSHAWN CAPSHAW MRN: [id number] Date of Birth: 04-14-1956

## 2016-05-13 ENCOUNTER — Ambulatory Visit: Payer: Self-pay | Admitting: Physical Therapy

## 2016-05-13 ENCOUNTER — Ambulatory Visit: Payer: 59

## 2016-05-13 ENCOUNTER — Encounter: Payer: Self-pay | Admitting: Occupational Therapy

## 2016-05-13 ENCOUNTER — Ambulatory Visit: Payer: 59 | Admitting: Occupational Therapy

## 2016-05-13 ENCOUNTER — Encounter: Payer: Self-pay | Admitting: Speech Pathology

## 2016-05-13 ENCOUNTER — Ambulatory Visit: Payer: 59 | Admitting: Physical Therapy

## 2016-05-13 DIAGNOSIS — M6281 Muscle weakness (generalized): Secondary | ICD-10-CM

## 2016-05-13 DIAGNOSIS — I69352 Hemiplegia and hemiparesis following cerebral infarction affecting left dominant side: Secondary | ICD-10-CM | POA: Diagnosis not present

## 2016-05-13 DIAGNOSIS — I69318 Other symptoms and signs involving cognitive functions following cerebral infarction: Secondary | ICD-10-CM

## 2016-05-13 DIAGNOSIS — R471 Dysarthria and anarthria: Secondary | ICD-10-CM

## 2016-05-13 DIAGNOSIS — R41841 Cognitive communication deficit: Secondary | ICD-10-CM

## 2016-05-13 DIAGNOSIS — R278 Other lack of coordination: Secondary | ICD-10-CM

## 2016-05-13 NOTE — Therapy (Signed)
[facility] [address] [place], [place], [address] Phone: [phone]   Fax:  [phone]  Speech Language Pathology Treatment  Patient Details  Name: Jon [name] MRN: [id number] Date of Birth: [birth year] Referring Provider: Dr. Claudette LawsAndrew Kirsteins   Encounter Date: 05/13/2016      End of Session - 05/13/16 1023    Visit Number 7   Number of Visits 17   Date for SLP Re-Evaluation 05/31/16   Authorization Type 20 ST    Authorization - Visit Number 6   Authorization - Number of Visits 20   SLP Start Time 0935   SLP Stop Time  1017   SLP Time Calculation (min) 42 min   Activity Tolerance Patient tolerated treatment well      Past Medical History:  Diagnosis Date  . Hypertension   . Liver damage   . Stroke [facility](HCC)     Past Surgical History:  Procedure Laterality Date  . TONSILLECTOMY AND ADENOIDECTOMY Bilateral    Age 60    There were no vitals filed for this visit.      Subjective Assessment - 05/13/16 0944    Subjective Pt with recall of last time he was at therapy.    Patient is accompained by: Family member  wife, [name]               ADULT SLP TREATMENT - 05/13/16 0939      General Information   Behavior/Cognition Cooperative;Pleasant mood;Distractible;Requires cueing;Decreased sustained attention     Treatment Provided   Treatment provided Cognitive-Linquistic     Cognitive-Linquistic Treatment   Treatment focused on Cognition   Skilled Treatment Pt req'd min verbal cues to provide cognitive deficits, did not completely agree wiht SLP on one or two deficit areas. Pt took extra time usually to answer math problems provided verbally.      Assessment / Recommendations / Plan   Plan Continue with current plan of care     Progression Toward Goals   Progression toward goals Progressing toward goals            SLP Short Term Goals - 05/13/16 0947      SLP SHORT TERM GOAL #1    Title Pt will selectively attend to details on mildly complex cognitive linguistic task with distractions and 85% accuracy.   Time 1   Period Weeks   Status On-going     SLP SHORT TERM GOAL #2   Title Pt will perform mildy complex organization, reasoning and time/money problems with 85% accuracy and rare min A   Time 1   Period Weeks   Status On-going     SLP SHORT TERM GOAL #3   Title Pt  will utlize external memory aids/book to recall tasks, errands, lists, and reduce repetitive question asking/frustration over 3 sessions with occasional min A.   Time 1   Period Weeks   Status On-going  pt reports he feels his memory is baseline     SLP SHORT TERM GOAL #4   Title Pt will face and maintain eye contact conversation partner on his left side with rare min A for 5 minutes conversation.    Time 1   Period Weeks   Status On-going     SLP SHORT TERM GOAL #5   Title pt will complete HEP with occasional min A (for dysarthria)   Time --   Period --   Status Achieved  SLP Long Term Goals - 05/13/16 1024      SLP LONG TERM GOAL #1   Title Pt will alterate attention between 2 mildly complex cognitive linguistic tasks with 85% on each and occasional min A   Time 7   Period Weeks   Status On-going     SLP LONG TERM GOAL #2   Title Pt will solve moderately complex reasoning, functional math and organization problems with 90% accuracy and occasional min A   Time 7   Period Weeks   Status On-going     SLP LONG TERM GOAL #3   Title Pt will utilize external aids for medication management with rare min A over 3 sessions.   Time 7   Period Weeks   Status On-going     SLP LONG TERM GOAL #4   Title pt will complete HEP for dysarthria with rare min A over two sessions   Time 7   Period Weeks   Status On-going          Plan - 05/13/16 1023    Clinical Impression Statement Pt improving attention with usual extra time allowed in listening to verbal math problems.  Continues to require ongoing cues to look at person on the left when talking to them. Continue skilled ST to maximize cognition for improved independence.    Speech Therapy Frequency 2x / week   Duration --  6 weeks   Treatment/Interventions Compensatory strategies;Functional tasks;Patient/family education;Cueing hierarchy;Cognitive reorganization;Internal/external aids;SLP instruction and feedback   Potential to Achieve Goals Good   Potential Considerations Severity of impairments   Consulted and Agree with Plan of Care Patient;Family member/caregiver   Family Member Consulted spouse, [name]      Patient will benefit from skilled therapeutic intervention in order to improve the following deficits and impairments:   Cognitive communication deficit  Dysarthria and anarthria    Problem List Patient Active Problem List   Diagnosis Date Noted  . Alcohol use disorder, severe, dependence (HCC) 02/15/2016  . Muscle contusion 02/11/2016  . Left flaccid hemiparesis   . Muscle spasticity   . Poor appetite   . Hyperlipidemia 01/14/2016  . Depression 01/14/2016  . Obesity 01/14/2016  . Hyperglycemia 01/14/2016  . Urinary tract infection, site not specified 01/14/2016  . Basal ganglia hemorrhage (HCC) 01/14/2016  . Hemiparesis affecting nondominant side as late effect of cerebrovascular accident (HCC)   . Cognitive deficit, post-stroke   . Left hemiparesis (HCC)   . Essential hypertension   . Dysphagia, post-stroke   . Gait disturbance, post-stroke   . Hyponatremia   . Thrombocytopenia (HCC)   . Hemorrhagic stroke ([facility]) R basal ganglia d/t HTN 01/08/2016    Ange Puskas ,MS, CCC-SLP  05/13/2016, 10:26 AM  [facility] [address] [place], [place], [address] Phone: [phone]   Fax:  [phone]   Name: Jon [name] MRN: [id number] Date of Birth: 02-03-1956

## 2016-05-13 NOTE — Patient Instructions (Signed)
  Please complete the assigned speech therapy homework and return it to your next session.  

## 2016-05-13 NOTE — Therapy (Signed)
[place] [address] [place], [place], [address] Phone: [phone]   Fax:  [phone]  Physical Therapy Treatment  Patient Details  Name: Jon [name] MRN: [id number] Date of Birth: 10/25/1955 Referring Provider: Dr. Letta Pate  Encounter Date: 05/13/2016      PT End of Session - 05/13/16 1942    Visit Number 12   Number of Visits 17   Date for PT Re-Evaluation 06/04/16   Authorization Type UHC and Medicare? G-CODE AND PROGRESS NOTE EVERY 10TH VISIT.    Authorization - Visit Number 12   Authorization - Number of Visits 20   PT Start Time 1107   PT Stop Time 1145   PT Time Calculation (min) 38 min   Equipment Utilized During Treatment Gait belt   Activity Tolerance Patient tolerated treatment well   Behavior During Therapy WFL for tasks assessed/performed      Past Medical History:  Diagnosis Date  . Hypertension   . Liver damage   . Stroke [facility])     Past Surgical History:  Procedure Laterality Date  . TONSILLECTOMY AND ADENOIDECTOMY Bilateral    Age 60    There were no vitals filed for this visit.      Subjective Assessment - 05/13/16 1111    Subjective Per patient, "I moved my (left) arm for the first time yesterday. That was pretty cool." Per wife, "He (patient) says there's no room in the house for the walker. There is room; he just doesn't want to use it."   Patient is accompained by: Family member  wife, [name]   Pertinent History Pt goes by [name].   PMH significant for: HTN, ETOH abuse per pt's wife but pt has not consumed alcohol since ICH, liver damage   Patient Stated Goals Control ankle movement better and improve placement of the foot.    Currently in Pain? No/denies                         [facility] Adult PT Treatment/Exercise - 05/13/16 0001      Transfers   Transfers Sit to Stand;Stand to Sit   Sit to Stand 5: Supervision   Sit to Stand Details (indicate cue  type and reason) to RW; pt self-managed LUE in h/o without cueing   Stand to Sit 5: Supervision   Stand to Sit Details with RW; pt required cueing for attention to LUE     Ambulation/Gait   Ambulation/Gait Yes   Ambulation/Gait Assistance 5: Supervision;4: Min guard   Ambulation/Gait Assistance Details Gait x110' over level, indoor surfaces then gait x130' over unlevel, paved surfaces with RW, L h/o, L AFO with cueing for lateral weight shift to R, slow movement to decrease LLE tone (which appears to impede initiation of LLE advancement). Requires (S) over level, indoor surfaces and min guard over unlevel, paved surfaces due to intermittent L foot drag (suspect due to the fact that leather toe cap ineffective over paved surfaces). During outdoor gait trial, PT provided assist/cueing for initial 40', then pt's wife provided assist/cueing for remainder of trial. Pt ambulated to car, where pt performed car transfer with RW and supervision. Gait x30' with [facility] with close supervision to min guard for stability/balance.   Ambulation Distance (Feet) 110 Feet  then x130', x30'   Assistive device Rolling walker;Other (Comment);Large base quad cane  with L h/o; L AFO   Gait Pattern Step-through pattern;Decreased dorsiflexion - left;Decreased hip/knee flexion - left;Decreased  stance time - left;Left flexed knee in stance;Lateral trunk lean to right;Trunk rotated posteriorly on left;Poor foot clearance - left;Decreased stride length;Decreased weight shift to right;Decreased weight shift to left   Ambulation Surface Level;Unlevel;Indoor;Outdoor;Paved   Curb 4: Min assist   Curb Details (indicate cue type and reason) with RW; required cueing for sequencing, max encouragement due to pt fear of falling.                PT Education - 05/13/16 1715    Education provided Yes   Education Details Extensive education provided on rationale for decreasing frequency to 1x/week with emphasis on visit limit,  insurance limitations. Based on gait trial with [facility], this PT expects pt could eventually transition to using [facility], but recommend RW at this time to maximize pt safety with community mobility. Reiterated primary PT's recommendation for max community distance of 82' (when not in PT) to maximize pt safety.   Person(s) Educated Patient;Spouse   Methods Explanation   Comprehension Verbalized understanding          PT Short Term Goals - 05/06/16 1331      PT SHORT TERM GOAL #1   Title Pt will verbalize understanding of risk factors and signs/sx's of CVA in order to reduce risk of addtional CVA. TARGET DATE FOR ALL STGS: 05/03/16   Status Partially Met     PT SHORT TERM GOAL #2   Title Perform BERG and write goal prn.    Baseline 9/13: Berg score = 9/56   Status Achieved     PT SHORT TERM GOAL #3   Title Pt will amb. 200' with LRAD and S over even terrain to improve functional mobility.    Baseline Met 10/13.   Status Achieved     PT SHORT TERM GOAL #4   Title Pt will improve gait speed with LRAD to >/=0.66f/sec. to improve functional mobility.    Baseline 10/13: gait velocity with RW and L hand orthosis = .38 ft/sec   Status Not Met     PT SHORT TERM GOAL #5   Title Pt will improve Berg score from 9/56 to 13/56 to indicate improved standing balance.    Status Achieved           PT Long Term Goals - 05/05/16 1240      PT LONG TERM GOAL #1   Title Pt will be IND in HEP to improve flexibility, strength, and balance. TARGET DATE FOR ALL LTGS: 05/31/16   Status On-going     PT LONG TERM GOAL #2   Title Pt will improve gait speed to >/=1.855fsec with LRAD to decr. falls risk.    Status On-going     PT LONG TERM GOAL #3   Title Pt will amb. 500' over even and paved surfaces at with LRAD at MOD I level to improve functional mobility.    Status On-going     PT LONG TERM GOAL #4   Title Pt will report zero falls over the last 4 weeks to improve safety during functional  mobility.    Status On-going     PT LONG TERM GOAL #5   Title Pt will improve Berg score from 29/56 to 34/56 to indicate improved standing balance.    Baseline 29/56 on 05/05/16   Status Revised               Plan - 05/13/16 1943    Clinical Impression Statement Session focused on gait training with RW, L hand  orthosis with emphasis on community mobility. Pt required min A, max encouragement for curb step negotiation due to fear of falling. Based on gait trial with [facility], expect pt could eventually transition to using [facility], but recommend RW at this time to maximize pt safety with community mobility.   Rehab Potential Good   Clinical Impairments Affecting Rehab Potential co-morbidities   PT Frequency 2x / week   PT Duration 8 weeks   PT Treatment/Interventions ADLs/Self Care Home Management;Biofeedback;Canalith Repostioning;Electrical Stimulation;Cognitive remediation;Neuromuscular re-education;Balance training;Therapeutic exercise;Therapeutic activities;Functional mobility training;Stair training;Gait training;DME Instruction;Orthotic Fit/Training;Patient/family education;Wheelchair mobility training;Manual techniques;Vestibular   PT Next Visit Plan  Gait with RW; may continue to trial [facility] SLS to improve proprioception   Consulted and Agree with Plan of Care Patient;Family member/caregiver   Family Member Consulted wife-[name]      Patient will benefit from skilled therapeutic intervention in order to improve the following deficits and impairments:  Abnormal gait, Decreased endurance, Impaired sensation, Decreased knowledge of use of DME, Decreased strength, Impaired UE functional use, Decreased mobility, Decreased balance, Decreased range of motion, Decreased cognition, Impaired flexibility, Postural dysfunction  Visit Diagnosis: Other lack of coordination  Muscle weakness (generalized)  Hemiplegia and hemiparesis following cerebral infarction affecting left dominant side  [facility])     Problem List Patient Active Problem List   Diagnosis Date Noted  . Alcohol use disorder, severe, dependence ([place]) 02/15/2016  . Muscle contusion 02/11/2016  . Left flaccid hemiparesis   . Muscle spasticity   . Poor appetite   . Hyperlipidemia 01/14/2016  . Depression 01/14/2016  . Obesity 01/14/2016  . Hyperglycemia 01/14/2016  . Urinary tract infection, site not specified 01/14/2016  . Basal ganglia hemorrhage ([place]) 01/14/2016  . Hemiparesis affecting nondominant side as late effect of cerebrovascular accident ([place])   . Cognitive deficit, post-stroke   . Left hemiparesis ([place])   . Essential hypertension   . Dysphagia, post-stroke   . Gait disturbance, post-stroke   . Hyponatremia   . Thrombocytopenia ([place])   . Hemorrhagic stroke ([place]) R basal ganglia d/t HTN 01/08/2016    Billie Ruddy, PT, DPT [facility] [address] [place], [place], [address] Phone: [phone]   Fax:  [phone] 05/13/16, 7:53 PM  Name: Jon [name] MRN: [id number] Date of Birth: 26-Aug-1955

## 2016-05-13 NOTE — Therapy (Signed)
[facility] [address] [place], [place], [address] Phone: [phone]   Fax:  [phone]  Occupational Therapy Treatment  Patient Details  Name: Jon [name] MRN: [id number] Date of Birth: 08/03/1955 Referring Provider: Dr Claudette Laws  Encounter Date: 05/13/2016      OT End of Session - 05/13/16 1708    Visit Number 10   Number of Visits 16   Date for OT Re-Evaluation 05/30/16   Authorization Type [employer] [facility])    Authorization - Visit Number 10   Authorization - Number of Visits 20   OT Start Time 1020   OT Stop Time 1100   OT Time Calculation (min) 40 min   Activity Tolerance Patient tolerated treatment well   Behavior During Therapy [facility] for tasks assessed/performed      Past Medical History:  Diagnosis Date  . Hypertension   . Liver damage   . Stroke [facility])     Past Surgical History:  Procedure Laterality Date  . TONSILLECTOMY AND ADENOIDECTOMY Bilateral    Age 60    There were no vitals filed for this visit.      Subjective Assessment - 05/13/16 1704    Patient is accompained by: Family member   Patient Stated Goals Get my left arm going again and have more control of my left foot.   Currently in Pain? Yes   Pain Score 4    Pain Location Arm   Pain Orientation Left   Pain Descriptors / Indicators Aching   Pain Type Acute pain   Pain Onset More than a month ago   Aggravating Factors  malpositioning   Pain Relieving Factors repositioning      Discussion with pt/ wife regarding reducing frequency to 1x week to conserve visits.  Pt/ wife verbalized understanding, however pt is currently scheduled out 2x/ wk through November  Neuro re-ed for weightbearing through left elbow over box while performing dynamic cross body reaching with RUE, mod v.c., min facilitation. Pt demonstrates new trace activation in left biceps, triceps.  P/ROM wrist flexion/ extension with gentle joint  mobs. Pt was instructed to perform passive wrist ROM at home due to tightness. NMES 50 pps, 250 pw, 10 secs cycle intensity 40 to finger and wrist extensors x 10 mins, therapistt facilitating during on cycle.. Mild pink coloration at electrode sites after removal, lotion applied.                          OT Short Term Goals - 05/05/16 1220      OT SHORT TERM GOAL #1   Title Pt will I state precautions/signs and symptoms of CVA   Baseline VC's   Time 4   Period Weeks   Status Achieved     OT SHORT TERM GOAL #2   Title Pt will demonstrate Mod I HEP for LUE self ROM    Baseline VC's and tc's   Time 4   Period Weeks   Status On-going     OT SHORT TERM GOAL #3   Title Pt will I state 2-3 edema control techniques LUE    Baseline VC's/Min A   Status Deferred           OT Long Term Goals - 05/05/16 1221      OT LONG TERM GOAL #1   Title Pt will be Mod I upgrade HEP LUE   Time 8   Period Weeks  Status New     OT LONG TERM GOAL #2   Title Pt will be Mod I simple snack prep in ADL kitchen (using manual w/c vs hemi walker).   Time 8   Period Weeks   Status New     OT LONG TERM GOAL #3   Title Pt will be Mod I basic ADL transfers using hemi walker    Time 8   Period Weeks   Status New     OT LONG TERM GOAL #4   Title Pt will be supervision LB dressing w/ a/e or DME PRN, using 1 handed technique   Time 8   Period Weeks   Status New     OT LONG TERM GOAL #5   Title Pt will be supervision level simulated bathing UB/LB using 1 handed technique   Time 8   Period Weeks   Status New               Plan - 05/13/16 1705    Clinical Impression Statement Pt is progressing slowly towards goals due to severity of cognitive deficits. Discussion today with pt/ wife regarding decreasing frequency to 1x week to conserve visits. (However it appears as though pt was now scheduled for 2x week)   Rehab Potential Fair   OT Frequency 2x / week   OT Duration  8 weeks   OT Treatment/Interventions Self-care/ADL training;Therapeutic exercise;DME and/or AE instruction;Building services engineerunctional Mobility Training;Therapeutic activities;Patient/family education;Splinting;Neuromuscular education;Electrical Stimulation;Passive range of motion;Therapeutic exercises;Visual/perceptual remediation/compensation   Plan discuss possible change in frequency? pt is scheduled 2x week through Nov.   Consulted and Agree with Plan of Care Patient;Family member/caregiver   Family Member Consulted Spouse      Patient will benefit from skilled therapeutic intervention in order to improve the following deficits and impairments:  Abnormal gait, Decreased balance, Decreased endurance, Difficulty walking, Impaired sensation, Impaired vision/preception, Pain, Increased edema, Decreased range of motion, Decreased cognition, Decreased activity tolerance, Decreased coordination, Decreased knowledge of use of DME, Decreased safety awareness, Decreased strength, Impaired UE functional use  Visit Diagnosis: Hemiplegia and hemiparesis following cerebral infarction affecting left dominant side ([facility])  Other lack of coordination  Muscle weakness (generalized)  Other symptoms and signs involving cognitive functions following cerebral infarction    Problem List Patient Active Problem List   Diagnosis Date Noted  . Alcohol use disorder, severe, dependence ([facility]) 02/15/2016  . Muscle contusion 02/11/2016  . Left flaccid hemiparesis   . Muscle spasticity   . Poor appetite   . Hyperlipidemia 01/14/2016  . Depression 01/14/2016  . Obesity 01/14/2016  . Hyperglycemia 01/14/2016  . Urinary tract infection, site not specified 01/14/2016  . Basal ganglia hemorrhage ([facility]) 01/14/2016  . Hemiparesis affecting nondominant side as late effect of cerebrovascular accident ([facility])   . Cognitive deficit, post-stroke   . Left hemiparesis ([facility])   . Essential hypertension   . Dysphagia, post-stroke   . Gait  disturbance, post-stroke   . Hyponatremia   . Thrombocytopenia ([facility])   . Hemorrhagic stroke ([facility]) R basal ganglia d/t HTN 01/08/2016    RINE,KATHRYN 05/13/2016, 5:09 PM  [facility] [address] [place], [place], [address] Phone: [phone]   Fax:  [phone]  Name: Jon [name] MRN: [id number] Date of Birth: Jun 30, 1956

## 2016-05-13 NOTE — Patient Instructions (Signed)
PROM: Wrist Flexion / Extension   Grasp  hand and slowly bend wrist until stretch is felt. Relax. Then stretch as far as possible in opposite direction. Be sure to keep elbow bent.  Hold __10__ sec. each way Repeat _5___ times per set.    Do _3___ sessions per day.  Copyright  VHI. All rights reserved.

## 2016-05-16 ENCOUNTER — Ambulatory Visit: Payer: 59

## 2016-05-16 ENCOUNTER — Encounter: Payer: Self-pay | Admitting: *Deleted

## 2016-05-16 ENCOUNTER — Telehealth: Payer: Self-pay | Admitting: Neurology

## 2016-05-16 ENCOUNTER — Ambulatory Visit: Payer: 59 | Admitting: *Deleted

## 2016-05-16 ENCOUNTER — Ambulatory Visit: Payer: 59 | Admitting: Physical Therapy

## 2016-05-16 DIAGNOSIS — I69352 Hemiplegia and hemiparesis following cerebral infarction affecting left dominant side: Secondary | ICD-10-CM

## 2016-05-16 DIAGNOSIS — R278 Other lack of coordination: Secondary | ICD-10-CM

## 2016-05-16 DIAGNOSIS — M6281 Muscle weakness (generalized): Secondary | ICD-10-CM

## 2016-05-16 DIAGNOSIS — I69318 Other symptoms and signs involving cognitive functions following cerebral infarction: Secondary | ICD-10-CM

## 2016-05-16 DIAGNOSIS — R41841 Cognitive communication deficit: Secondary | ICD-10-CM

## 2016-05-16 NOTE — Therapy (Signed)
[facility] [address] [place], [place], [address] Phone: [phone]   Fax:  [phone]  Physical Therapy Treatment  Patient Details  Name: Jon [name] MRN: [id number] Date of Birth: Jul 30, 1955 Referring Provider: Dr. Letta Pate  Encounter Date: 05/16/2016      PT End of Session - 05/16/16 2000    Visit Number 13   Number of Visits 17   Date for PT Re-Evaluation 06/04/16   Authorization Type UHC and Medicare? G-CODE AND PROGRESS NOTE EVERY 10TH VISIT.    Authorization - Visit Number 13   Authorization - Number of Visits 20   PT Start Time 2992   PT Stop Time 1102   PT Time Calculation (min) 41 min   Equipment Utilized During Treatment Gait belt   Activity Tolerance Patient tolerated treatment well   Behavior During Therapy WFL for tasks assessed/performed;Flat affect      Past Medical History:  Diagnosis Date  . Hypertension   . Liver damage   . Stroke [facility])     Past Surgical History:  Procedure Laterality Date  . TONSILLECTOMY AND ADENOIDECTOMY Bilateral    Age 60    There were no vitals filed for this visit.      Subjective Assessment - 05/16/16 1023    Subjective "I walked in today."   Patient is accompained by: Family member  wife, [name]   Pertinent History Pt goes by [name].   PMH significant for: HTN, ETOH abuse per pt's wife but pt has not consumed alcohol since ICH, liver damage   Patient Stated Goals Control ankle movement better and improve placement of the foot.    Currently in Pain? No/denies                         [facility] Adult PT Treatment/Exercise - 05/16/16 1944      Transfers   Transfers Sit to Stand;Stand to Sit   Sit to Stand 5: Supervision;4: Min guard;4: Min assist   Sit to Stand Details (indicate cue type and reason) Blocked practice of sit <> stand (for majority of session; rest breaks, as needed, per pt request) with mirror anterior to pt for  visual feedback, tactile cueing for increased LLE WB for increased symmetry, midline orientation;  tactile cueing at L knee for increased proprioceptive input, WB; 2" step under R foot for increased LLE WB.   Stand to Sit 5: Supervision;4: Min guard;4: Min assist   Stand to Sit Details See Sit to Stand details above.     Ambulation/Gait   Ambulation/Gait Yes   Ambulation/Gait Assistance 5: Supervision   Ambulation/Gait Assistance Details Gait x75' then x100' over level, indoor surfaces with supervision; noted more consistent initiation of LLE advancement as well as LLE clearance without compensation or L forefoot drag during this session. Emphasized turning and obstacle negotiation; cueing focused on advancement of walker prior to stepping during turns, backwards when approaching sitting surface with inconsistent within-session carryover.   Ambulation Distance (Feet) 175 Feet  31' and 100'   Assistive device Rolling walker;Other (Comment)  with L h/o; L AFO   Gait Pattern Step-through pattern;Decreased dorsiflexion - left;Decreased hip/knee flexion - left;Decreased stance time - left;Left flexed knee in stance;Lateral trunk lean to right;Trunk rotated posteriorly on left;Poor foot clearance - left;Decreased stride length;Decreased weight shift to right;Decreased weight shift to left   Ambulation Surface Level;Indoor   Ramp 4: Min assist   Ramp Details (indicate cue type and  reason) using RW and L h/o; required mod encouragement (due to pt fear of falling), cueing for R hand placement to slow descent   Curb 3: Mod assist   Curb Details (indicate cue type and reason) using RW, L h/o; assist required to place RW onto/off of curb step due to LUE weakness and for anterior weight shift during negotiation.   Pre-Gait Activities With mirror anterior to pt and no UE support, performed blocked practice of RLE advancement with emphasis on full L lateral weight shift.                PT Education -  05/16/16 1943    Education provided Yes   Education Details Importance of symmetrical WB during sit <> stand for NMR, decreasing spasticity.    Person(s) Educated Patient;Spouse   Methods Explanation;Verbal cues;Demonstration;Tactile cues   Comprehension Verbalized understanding;Need further instruction          PT Short Term Goals - 05/06/16 1331      PT SHORT TERM GOAL #1   Title Pt will verbalize understanding of risk factors and signs/sx's of CVA in order to reduce risk of addtional CVA. TARGET DATE FOR ALL STGS: 05/03/16   Status Partially Met     PT SHORT TERM GOAL #2   Title Perform BERG and write goal prn.    Baseline 9/13: Berg score = 9/56   Status Achieved     PT SHORT TERM GOAL #3   Title Pt will amb. 200' with LRAD and S over even terrain to improve functional mobility.    Baseline Met 10/13.   Status Achieved     PT SHORT TERM GOAL #4   Title Pt will improve gait speed with LRAD to >/=0.58f/sec. to improve functional mobility.    Baseline 10/13: gait velocity with RW and L hand orthosis = .38 ft/sec   Status Not Met     PT SHORT TERM GOAL #5   Title Pt will improve Berg score from 9/56 to 13/56 to indicate improved standing balance.    Status Achieved           PT Long Term Goals - 05/05/16 1240      PT LONG TERM GOAL #1   Title Pt will be IND in HEP to improve flexibility, strength, and balance. TARGET DATE FOR ALL LTGS: 05/31/16   Status On-going     PT LONG TERM GOAL #2   Title Pt will improve gait speed to >/=1.868fsec with LRAD to decr. falls risk.    Status On-going     PT LONG TERM GOAL #3   Title Pt will amb. 500' over even and paved surfaces at with LRAD at MOD I level to improve functional mobility.    Status On-going     PT LONG TERM GOAL #4   Title Pt will report zero falls over the last 4 weeks to improve safety during functional mobility.    Status On-going     PT LONG TERM GOAL #5   Title Pt will improve Berg score from 29/56 to  34/56 to indicate improved standing balance.    Baseline 29/56 on 05/05/16   Status Revised               Plan - 05/16/16 1953    Clinical Impression Statement Skilled session focused on increasing symmetry of LE placement, weightbearing during functional transfers as well as obstacle negotiation (household and community). Activity tolerance improving, as indicated by pt ability to ambulate  into/out therapy clinic with RW, L h/o (wheelchair left in car) for the first time during this session.   Rehab Potential Good   Clinical Impairments Affecting Rehab Potential co-morbidities   PT Frequency 2x / week   PT Duration 8 weeks   PT Treatment/Interventions ADLs/Self Care Home Management;Biofeedback;Canalith Repostioning;Electrical Stimulation;Cognitive remediation;Neuromuscular re-education;Balance training;Therapeutic exercise;Therapeutic activities;Functional mobility training;Stair training;Gait training;DME Instruction;Orthotic Fit/Training;Patient/family education;Wheelchair mobility training;Manual techniques;Vestibular   PT Next Visit Plan  Gait with RW; may continue to trial [facility]. SLS to improve proprioception. Curb step, ramp negotiation; negotiation of obstacles over level surfaces as well.   Consulted and Agree with Plan of Care Patient;Family member/caregiver   Family Member Consulted wife-[name]      Patient will benefit from skilled therapeutic intervention in order to improve the following deficits and impairments:  Abnormal gait, Decreased endurance, Impaired sensation, Decreased knowledge of use of DME, Decreased strength, Impaired UE functional use, Decreased mobility, Decreased balance, Decreased range of motion, Decreased cognition, Impaired flexibility, Postural dysfunction  Visit Diagnosis: Hemiplegia and hemiparesis following cerebral infarction affecting left dominant side (HCC)  Other lack of coordination  Muscle weakness (generalized)     Problem  List Patient Active Problem List   Diagnosis Date Noted  . Alcohol use disorder, severe, dependence ([place]) 02/15/2016  . Muscle contusion 02/11/2016  . Left flaccid hemiparesis   . Muscle spasticity   . Poor appetite   . Hyperlipidemia 01/14/2016  . Depression 01/14/2016  . Obesity 01/14/2016  . Hyperglycemia 01/14/2016  . Urinary tract infection, site not specified 01/14/2016  . Basal ganglia hemorrhage ([place]) 01/14/2016  . Hemiparesis affecting nondominant side as late effect of cerebrovascular accident ([place])   . Cognitive deficit, post-stroke   . Left hemiparesis ([place])   . Essential hypertension   . Dysphagia, post-stroke   . Gait disturbance, post-stroke   . Hyponatremia   . Thrombocytopenia ([place])   . Hemorrhagic stroke ([place]) R basal ganglia d/t HTN 01/08/2016    Billie Ruddy, PT, DPT [facility] [address] [place], [place], [address] Phone: [phone]   Fax:  [phone] 05/16/16, 8:02 PM  Name: KYMARI NUON MRN: [id number] Date of Birth: [birth year]

## 2016-05-16 NOTE — Telephone Encounter (Signed)
Pt's wife stopped by to bring paper work and just wanted us to tell you that she misread the instructions for the Zoloft and is now giving it to him at night and seeing much better results.  This is just an FYI-slb

## 2016-05-16 NOTE — Therapy (Signed)
[facility] [address] [place], [place], [address] Phone: [phone]   Fax:  [phone]  Occupational Therapy Treatment  Patient Details  Name: Jon [name] MRN: [id number] Date of Birth: 19-Mar-1956 Referring Provider: Dr Claudette Laws  Encounter Date: 05/16/2016      OT End of Session - 05/16/16 1241    Visit Number 11   Number of Visits 16   Date for OT Re-Evaluation 05/30/16   Authorization Type UHC [facility])    Authorization - Visit Number 11   Authorization - Number of Visits 20   OT Start Time [phone]   OT Stop Time 1020   OT Time Calculation (min) 52 min   Activity Tolerance Patient tolerated treatment well   Behavior During Therapy [facility] for tasks assessed/performed;Flat affect      Past Medical History:  Diagnosis Date  . Hypertension   . Liver damage   . Stroke [facility])     Past Surgical History:  Procedure Laterality Date  . TONSILLECTOMY AND ADENOIDECTOMY Bilateral    Age 60    There were no vitals filed for this visit.                    OT Treatments/Exercises (OP) - 05/16/16 0001      ADLs   Overall ADLs Encourage pt performing ADL daily. Pt cont to state that wife assists with dressing/bathing most days as self performance "Takes too long when I have to be somewhere"      Shoulder Exercises: Seated   Other Seated Exercises Seated for gentle LUE stretching at table top. Gentle PROM to digits and wrist for felxion/extension, composite fist and wrist flexion/extension.    Other Seated Exercises LUE on towel on table top while seated for gentle AROm/A/AROM for elbow flexion followed by A/AROM/PROM for elbow extension (Neuromuscular re-ed).     Neurological Re-education Exercises   Other Exercises 1 Reviewed HEP, pt is not performing consistently or even daily at home, lack of carryover.     Modalities   Modalities Administrator, sports Stimulation Location NMES 50 pps, 250 pw, 10 second cycle, intensity 21 to finger and wrist extensors x10 min.   Electrical Stimulation Parameters See above   Electrical Stimulation Goals Neuromuscular facilitation     Splinting   Splinting Stressed imporatance of use of R UE resting hand splint as pt reports not using - lack of carry over. "It's hard to wear at night, hard to sleep" Pt states that he sometimes uses during day but is unable to say how frequently or for how long.                OT Education - 05/16/16 1238    Education provided Yes   Education Details Reivew of rationale for decreasing frequency to 1x/week w/ emphasis on visit limti, insurance limitations. Review importance of self performance of HEP/splint use with family assist PRN to assist with increased independence with ADL's and self care tasks as well as HEP LUE    Person(s) Educated Patient;Spouse   Methods Explanation   Comprehension Verbalized understanding          OT Short Term Goals - 05/05/16 1220      OT SHORT TERM GOAL #1   Title Pt will I state precautions/signs and symptoms of CVA   Baseline VC's   Time 4   Period Weeks   Status  Achieved     OT SHORT TERM GOAL #2   Title Pt will demonstrate Mod I HEP for LUE self ROM    Baseline VC's and tc's   Time 4   Period Weeks   Status On-going     OT SHORT TERM GOAL #3   Title Pt will I state 2-3 edema control techniques LUE    Baseline VC's/Min A   Status Deferred           OT Long Term Goals - 05/05/16 1221      OT LONG TERM GOAL #1   Title Pt will be Mod I upgrade HEP LUE   Time 8   Period Weeks   Status New     OT LONG TERM GOAL #2   Title Pt will be Mod I simple snack prep in ADL kitchen (using manual w/c vs hemi walker).   Time 8   Period Weeks   Status New     OT LONG TERM GOAL #3   Title Pt will be Mod I basic ADL transfers using hemi walker    Time 8   Period Weeks   Status New     OT  LONG TERM GOAL #4   Title Pt will be supervision LB dressing w/ a/e or DME PRN, using 1 handed technique   Time 8   Period Weeks   Status New     OT LONG TERM GOAL #5   Title Pt will be supervision level simulated bathing UB/LB using 1 handed technique   Time 8   Period Weeks   Status New               Plan - 05/16/16 1242    Clinical Impression Statement Discussed decreasing frequency with pt/wife to 1x/week to conserve visits. Pt progressing slowly toward goals sue to severity of cognitive deficits overall.   Rehab Potential Fair   OT Frequency 2x / week  recommend decreasing to 1x/week   OT Duration 8 weeks   OT Treatment/Interventions Self-care/ADL training;Therapeutic exercise;DME and/or AE instruction;Building services engineer;Therapeutic activities;Patient/family education;Splinting;Neuromuscular education;Electrical Stimulation;Passive range of motion;Therapeutic exercises;Visual/perceptual remediation/compensation   Plan Neuro muscular Re-ed; Review HEP, NMES LUE wrist and finger extensors.   Consulted and Agree with Plan of Care Patient;Family member/caregiver   Family Member Consulted Spouse      Patient will benefit from skilled therapeutic intervention in order to improve the following deficits and impairments:  Abnormal gait, Decreased balance, Decreased endurance, Difficulty walking, Impaired sensation, Impaired vision/preception, Pain, Increased edema, Decreased range of motion, Decreased cognition, Decreased activity tolerance, Decreased coordination, Decreased knowledge of use of DME, Decreased safety awareness, Decreased strength, Impaired UE functional use  Visit Diagnosis: Cognitive communication deficit  Hemiplegia and hemiparesis following cerebral infarction affecting left dominant side (HCC)  Other lack of coordination  Muscle weakness (generalized)  Other symptoms and signs involving cognitive functions following cerebral  infarction    Problem List Patient Active Problem List   Diagnosis Date Noted  . Alcohol use disorder, severe, dependence (HCC) 02/15/2016  . Muscle contusion 02/11/2016  . Left flaccid hemiparesis   . Muscle spasticity   . Poor appetite   . Hyperlipidemia 01/14/2016  . Depression 01/14/2016  . Obesity 01/14/2016  . Hyperglycemia 01/14/2016  . Urinary tract infection, site not specified 01/14/2016  . Basal ganglia hemorrhage (HCC) 01/14/2016  . Hemiparesis affecting nondominant side as late effect of cerebrovascular accident (HCC)   . Cognitive deficit, post-stroke   . Left hemiparesis (HCC)   .  Essential hypertension   . Dysphagia, post-stroke   . Gait disturbance, post-stroke   . Hyponatremia   . Thrombocytopenia (HCC)   . Hemorrhagic stroke (HCC) R basal ganglia d/t HTN 01/08/2016    [place], Amy Dionicio StallBeth Dixon, OTR/L 05/16/2016, 12:49 PM  [facility] [address] [place], [place], [address] Phone: [phone]   Fax:  [phone]  Name: Jon [name] MRN: [id number] Date of Birth: [birth year]

## 2016-05-18 ENCOUNTER — Ambulatory Visit: Payer: 59 | Admitting: Physical Therapy

## 2016-05-18 ENCOUNTER — Ambulatory Visit: Payer: 59 | Admitting: Occupational Therapy

## 2016-05-18 DIAGNOSIS — I69318 Other symptoms and signs involving cognitive functions following cerebral infarction: Secondary | ICD-10-CM

## 2016-05-18 DIAGNOSIS — I69352 Hemiplegia and hemiparesis following cerebral infarction affecting left dominant side: Secondary | ICD-10-CM | POA: Diagnosis not present

## 2016-05-18 DIAGNOSIS — M6281 Muscle weakness (generalized): Secondary | ICD-10-CM

## 2016-05-18 DIAGNOSIS — R278 Other lack of coordination: Secondary | ICD-10-CM

## 2016-05-18 NOTE — Therapy (Signed)
[facility] [address] [place], [place], [address] Phone: [phone]   Fax:  [phone]  Physical Therapy Treatment  Patient Details  Name: Jon [name] MRN: [id number] Date of Birth: 01/12/1956 Referring Provider: Dr. Letta Pate  Encounter Date: 05/18/2016      PT End of Session - 05/18/16 1727    Visit Number 14   Number of Visits 17   Date for PT Re-Evaluation 06/04/16   Authorization Type UHC and Medicare? G-CODE AND PROGRESS NOTE EVERY 10TH VISIT.    Authorization - Visit Number 14   Authorization - Number of Visits 20   PT Start Time 7253   PT Stop Time 1530   PT Time Calculation (min) 42 min   Equipment Utilized During Treatment Gait belt   Activity Tolerance Patient tolerated treatment well   Behavior During Therapy WFL for tasks assessed/performed;Flat affect      Past Medical History:  Diagnosis Date  . Hypertension   . Liver damage   . Stroke [facility])     Past Surgical History:  Procedure Laterality Date  . TONSILLECTOMY AND ADENOIDECTOMY Bilateral    Age 60    There were no vitals filed for this visit.                       [facility] Adult PT Treatment/Exercise - 05/18/16 0001      Transfers   Transfers Sit to Stand;Stand to Sit   Sit to Stand 5: Supervision   Sit to Stand Details (indicate cue type and reason) Continues to require cueing for attention to/placement of LLE.   Stand to Sit 5: Supervision   Stand to Sit Details Continues to require cueing for attention to LUE (and removal of L hand from L h/o prior to sitting).     Ambulation/Gait   Ambulation/Gait Yes   Ambulation/Gait Assistance 4: Min assist;3: Mod assist   Ambulation/Gait Assistance Details Blocked practice of gait without AD 6 x10' then 4 x20' over level, indoor surfaces with mirror anterior to pt, multimodal cueing for visual attention to mirror, midline orientation, decreased speed of movement to  improve quality/postural control, increased lateral weight shift to L side.    Assistive device None  L AFO   Gait Pattern Step-through pattern;Decreased dorsiflexion - left;Decreased hip/knee flexion - left;Decreased stance time - left;Left flexed knee in stance;Lateral trunk lean to right;Trunk rotated posteriorly on left;Decreased weight shift to right;Decreased weight shift to left;Step-to pattern;Decreased step length - right   Ambulation Surface Level;Indoor                PT Education - 05/18/16 1718    Education Details Reiterated education on importance of seated/standing posture, symmetrical WB, and attention to LUE/LLE positioning at all times to maximize functional return.   Person(s) Educated Spouse;Patient   Methods Demonstration;Explanation;Verbal cues;Tactile cues   Comprehension Verbalized understanding;Need further instruction          PT Short Term Goals - 05/06/16 1331      PT SHORT TERM GOAL #1   Title Pt will verbalize understanding of risk factors and signs/sx's of CVA in order to reduce risk of addtional CVA. TARGET DATE FOR ALL STGS: 05/03/16   Status Partially Met     PT SHORT TERM GOAL #2   Title Perform BERG and write goal prn.    Baseline 9/13: Berg score = 9/56   Status Achieved     PT SHORT TERM GOAL #3  Title Pt will amb. 200' with LRAD and S over even terrain to improve functional mobility.    Baseline Met 10/13.   Status Achieved     PT SHORT TERM GOAL #4   Title Pt will improve gait speed with LRAD to >/=0.77f/sec. to improve functional mobility.    Baseline 10/13: gait velocity with RW and L hand orthosis = .38 ft/sec   Status Not Met     PT SHORT TERM GOAL #5   Title Pt will improve Berg score from 9/56 to 13/56 to indicate improved standing balance.    Status Achieved           PT Long Term Goals - 05/05/16 1240      PT LONG TERM GOAL #1   Title Pt will be IND in HEP to improve flexibility, strength, and balance.  TARGET DATE FOR ALL LTGS: 05/31/16   Status On-going     PT LONG TERM GOAL #2   Title Pt will improve gait speed to >/=1.880fsec with LRAD to decr. falls risk.    Status On-going     PT LONG TERM GOAL #3   Title Pt will amb. 500' over even and paved surfaces at with LRAD at MOD I level to improve functional mobility.    Status On-going     PT LONG TERM GOAL #4   Title Pt will report zero falls over the last 4 weeks to improve safety during functional mobility.    Status On-going     PT LONG TERM GOAL #5   Title Pt will improve Berg score from 29/56 to 34/56 to indicate improved standing balance.    Baseline 29/56 on 05/05/16   Status Revised               Plan - 05/18/16 1727    Clinical Impression Statement Session focused on promoting midline orientation, attention to LUE/LLE during functional mobility. Pt continues to require frequent cueing for LUE/LLE attention/management. Note activity tolerance is improving.   Rehab Potential Good   Clinical Impairments Affecting Rehab Potential co-morbidities   PT Frequency 2x / week   PT Duration 8 weeks   PT Treatment/Interventions ADLs/Self Care Home Management;Biofeedback;Canalith Repostioning;Electrical Stimulation;Cognitive remediation;Neuromuscular re-education;Balance training;Therapeutic exercise;Therapeutic activities;Functional mobility training;Stair training;Gait training;DME Instruction;Orthotic Fit/Training;Patient/family education;Wheelchair mobility training;Manual techniques;Vestibular   PT Next Visit Plan  Gait with RW; [facility] to improve proprioception. Curb step, ramp negotiation; obstacle negotiation indoors.   Consulted and Agree with Plan of Care Patient;Family member/caregiver   Family Member Consulted wife-[name]      Patient will benefit from skilled therapeutic intervention in order to improve the following deficits and impairments:  Abnormal gait, Decreased endurance, Impaired  sensation, Decreased knowledge of use of DME, Decreased strength, Impaired UE functional use, Decreased mobility, Decreased balance, Decreased range of motion, Decreased cognition, Impaired flexibility, Postural dysfunction  Visit Diagnosis: Hemiplegia and hemiparesis following cerebral infarction affecting left dominant side (HCC)  Muscle weakness (generalized)  Other lack of coordination     Problem List Patient Active Problem List   Diagnosis Date Noted  . Alcohol use disorder, severe, dependence ([place]07/24/2017  . Muscle contusion 02/11/2016  . Left flaccid hemiparesis   . Muscle spasticity   . Poor appetite   . Hyperlipidemia 01/14/2016  . Depression 01/14/2016  . Obesity 01/14/2016  . Hyperglycemia 01/14/2016  . Urinary tract infection, site not specified 01/14/2016  . Basal ganglia hemorrhage ([place]06/22/2017  . Hemiparesis affecting nondominant side as late  effect of cerebrovascular accident ([place])   . Cognitive deficit, post-stroke   . Left hemiparesis ([place])   . Essential hypertension   . Dysphagia, post-stroke   . Gait disturbance, post-stroke   . Hyponatremia   . Thrombocytopenia ([place])   . Hemorrhagic stroke ([place]) R basal ganglia d/t HTN 01/08/2016    Billie Ruddy, PT, DPT [facility] [address] [place], [place], [address] Phone: [phone]   Fax:  [phone] 05/18/16, 5:35 PM  Name: Jon [name] MRN: [id number] Date of Birth: [birth year]

## 2016-05-18 NOTE — Therapy (Signed)
[facility] [address] [place], [place], [address] Phone: [phone]   Fax:  [phone]  Occupational Therapy Treatment  Patient Details  Name: Jon CoolerGeorge D [name] MRN: [id number] Date of Birth: 1955/11/05 Referring Provider: Dr Claudette LawsAndrew Kirsteins  Encounter Date: 05/18/2016      OT End of Session - 05/18/16 1324    Visit Number 12   Number of Visits 16   Date for OT Re-Evaluation 05/30/16   Authorization Type [employer] [facility]([employer])    Authorization - Visit Number 11   Authorization - Number of Visits 20   OT Start Time 1317   OT Stop Time 1400   OT Time Calculation (min) 43 min   Activity Tolerance Patient tolerated treatment well   Behavior During Therapy [facility] for tasks assessed/performed;Flat affect      Past Medical History:  Diagnosis Date  . Hypertension   . Liver damage   . Stroke [facility](HCC)     Past Surgical History:  Procedure Laterality Date  . TONSILLECTOMY AND ADENOIDECTOMY Bilateral    Age 60    There were no vitals filed for this visit.      Subjective Assessment - 05/18/16 1323    Patient is accompained by: Family member   Patient Stated Goals Get my left arm going again and have more control of my left foot.   Currently in Pain? Yes                discussion with pt/ wife regarding conserving visits, pt's wife reports pt is reducing in NOV, however pt is still scheduled 2x week for several weeks. Neuro re-ed for weightbearing through left hand on tilted stool for shoulder flexion/ extension, mod v.c., min facilitation. Pt demonstrates new trace activation in left biceps, triceps.  P/ROM wrist flexion/ extension with gentle joint mobs. Pt was instructed to perform passive wrist ROM at home due to tightness. NMES 50 pps, 250 pw, 10 secs cycle intensity 40 to finger and wrist extensors x 7 mins, therapist facilitating during on cycle.. Mild pink coloration at electrode sites after  removal,  Doffing and donning shirt with min A and mod v.c., pt was encouraged to perform consistently at home.                OT Short Term Goals - 05/05/16 1220      OT SHORT TERM GOAL #1   Title Pt will I state precautions/signs and symptoms of CVA   Baseline VC's   Time 4   Period Weeks   Status Achieved     OT SHORT TERM GOAL #2   Title Pt will demonstrate Mod I HEP for LUE self ROM    Baseline VC's and tc's   Time 4   Period Weeks   Status On-going     OT SHORT TERM GOAL #3   Title Pt will I state 2-3 edema control techniques LUE    Baseline VC's/Min A   Status Deferred           OT Long Term Goals - 05/05/16 1221      OT LONG TERM GOAL #1   Title Pt will be Mod I upgrade HEP LUE   Time 8   Period Weeks   Status New     OT LONG TERM GOAL #2   Title Pt will be Mod I simple snack prep in ADL kitchen (using manual w/c vs hemi walker).   Time 8   Period  Weeks   Status New     OT LONG TERM GOAL #3   Title Pt will be Mod I basic ADL transfers using hemi walker    Time 8   Period Weeks   Status New     OT LONG TERM GOAL #4   Title Pt will be supervision LB dressing w/ a/e or DME PRN, using 1 handed technique   Time 8   Period Weeks   Status New     OT LONG TERM GOAL #5   Title Pt will be supervision level simulated bathing UB/LB using 1 handed technique   Time 8   Period Weeks   Status New               Plan - 05/18/16 1646    Clinical Impression Statement Reinforced importance of conserving visits with pt/ wife. Pt is still scheduled 2x in some future weeks. Pt is progressing slowly towards goals, however improved ability to donn/ doff shirt today.   Rehab Potential Fair   OT Frequency 2x / week   OT Duration 8 weeks   OT Treatment/Interventions Self-care/ADL training;Therapeutic exercise;DME and/or AE instruction;Building services engineer;Therapeutic activities;Patient/family education;Splinting;Neuromuscular  education;Electrical Stimulation;Passive range of motion;Therapeutic exercises;Visual/perceptual remediation/compensation   Plan neuro re-ed, NMES, ADLs   Consulted and Agree with Plan of Care Patient   Family Member Consulted Spouse      Patient will benefit from skilled therapeutic intervention in order to improve the following deficits and impairments:  Abnormal gait, Decreased balance, Decreased endurance, Difficulty walking, Impaired sensation, Impaired vision/preception, Pain, Increased edema, Decreased range of motion, Decreased cognition, Decreased activity tolerance, Decreased coordination, Decreased knowledge of use of DME, Decreased safety awareness, Decreased strength, Impaired UE functional use  Visit Diagnosis: Hemiplegia and hemiparesis following cerebral infarction affecting left dominant side (HCC)  Other lack of coordination  Muscle weakness (generalized)  Other symptoms and signs involving cognitive functions following cerebral infarction    Problem List Patient Active Problem List   Diagnosis Date Noted  . Alcohol use disorder, severe, dependence (HCC) 02/15/2016  . Muscle contusion 02/11/2016  . Left flaccid hemiparesis   . Muscle spasticity   . Poor appetite   . Hyperlipidemia 01/14/2016  . Depression 01/14/2016  . Obesity 01/14/2016  . Hyperglycemia 01/14/2016  . Urinary tract infection, site not specified 01/14/2016  . Basal ganglia hemorrhage (HCC) 01/14/2016  . Hemiparesis affecting nondominant side as late effect of cerebrovascular accident (HCC)   . Cognitive deficit, post-stroke   . Left hemiparesis (HCC)   . Essential hypertension   . Dysphagia, post-stroke   . Gait disturbance, post-stroke   . Hyponatremia   . Thrombocytopenia (HCC)   . Hemorrhagic stroke (HCC) R basal ganglia d/t HTN 01/08/2016    Jon [name] 05/18/2016, 4:48 PM  [facility] [address] [place], [place],  [address] Phone: [phone]   Fax:  [phone]  Name: Jon [name] MRN: [id number] Date of Birth: 02/04/1956

## 2016-05-19 ENCOUNTER — Ambulatory Visit (HOSPITAL_BASED_OUTPATIENT_CLINIC_OR_DEPARTMENT_OTHER): Payer: 59 | Admitting: Physical Medicine & Rehabilitation

## 2016-05-19 ENCOUNTER — Encounter: Payer: 59 | Attending: Physical Medicine & Rehabilitation

## 2016-05-19 ENCOUNTER — Encounter: Payer: Self-pay | Admitting: Physical Medicine & Rehabilitation

## 2016-05-19 VITALS — BP 128/71 | HR 60 | Resp 16

## 2016-05-19 DIAGNOSIS — F1010 Alcohol abuse, uncomplicated: Secondary | ICD-10-CM | POA: Insufficient documentation

## 2016-05-19 DIAGNOSIS — I10 Essential (primary) hypertension: Secondary | ICD-10-CM | POA: Insufficient documentation

## 2016-05-19 DIAGNOSIS — M792 Neuralgia and neuritis, unspecified: Secondary | ICD-10-CM | POA: Insufficient documentation

## 2016-05-19 DIAGNOSIS — I69319 Unspecified symptoms and signs involving cognitive functions following cerebral infarction: Secondary | ICD-10-CM

## 2016-05-19 DIAGNOSIS — G5692 Unspecified mononeuropathy of left upper limb: Secondary | ICD-10-CM | POA: Diagnosis not present

## 2016-05-19 DIAGNOSIS — I610 Nontraumatic intracerebral hemorrhage in hemisphere, subcortical: Secondary | ICD-10-CM

## 2016-05-19 DIAGNOSIS — Z5189 Encounter for other specified aftercare: Secondary | ICD-10-CM | POA: Diagnosis present

## 2016-05-19 DIAGNOSIS — R269 Unspecified abnormalities of gait and mobility: Secondary | ICD-10-CM

## 2016-05-19 DIAGNOSIS — R252 Cramp and spasm: Secondary | ICD-10-CM | POA: Diagnosis not present

## 2016-05-19 DIAGNOSIS — I639 Cerebral infarction, unspecified: Secondary | ICD-10-CM | POA: Diagnosis present

## 2016-05-19 DIAGNOSIS — I629 Nontraumatic intracranial hemorrhage, unspecified: Secondary | ICD-10-CM | POA: Insufficient documentation

## 2016-05-19 DIAGNOSIS — I69398 Other sequelae of cerebral infarction: Secondary | ICD-10-CM

## 2016-05-19 DIAGNOSIS — G8194 Hemiplegia, unspecified affecting left nondominant side: Secondary | ICD-10-CM | POA: Diagnosis not present

## 2016-05-19 NOTE — Patient Instructions (Signed)
If  spasticity gets worse. You may need Botox injection

## 2016-05-19 NOTE — Patient Instructions (Signed)
May need Botox if spasticity worsens

## 2016-05-19 NOTE — Progress Notes (Signed)
Subjective:    Patient ID: Jon [name], male    DOB: 03/11/1956, 60 y.o.   MRN: [id number] 60 year old right-handed male with history of hypertension as well as alcohol use, on no prescription medications. Married, working full-time. Presented January 08, 2016, with acute onset of headache and left-sided weakness. Initial blood pressures were reportedly elevated. CT of the head showed right basal ganglia hemorrhage with 31 mL volume, 3 mm midline shift. CT angiogram of the head and neck with no extracranial stenosis or dissection. Echocardiogram with ejection fraction of 65%, no wall motion abnormalities. The patient did not receive tPA. Subcutaneous heparin for DVT prophylaxis initiated January 10, 2016. Dysphagia #1 nectar thick liquid diet. Discharge from inpatient rehabilitation in July 2017 HPI Patient continues to receive outpatient PT, OT and speech therapy. He is running into his therapy visit limits for this year. This is through his insurance company, [employer]. Review therapy notes. He is making good progress in PT, OT and speech therapy. Has been achieving his goals, but still requires a moderate assist level for mobility and still presents a high fall risk.  He denies any uncontrolled spasms in his left hand. He continues to have left shoulder pain with movement Pain Inventory Average Pain 0 Pain Right Now 0 My pain is no pain  In the last 24 hours, has pain interfered with the following? General activity 0 Relation with others 0 Enjoyment of life 0 What TIME of day is your pain at its worst? no pain Sleep (in general) Good  Pain is worse with: no pain Pain improves with: no pain Relief from Meds: no pain  Mobility walk with assistance use a walker needs help with transfers  Function I need assistance with the following:  dressing, bathing, toileting, meal prep, household duties and shopping  Neuro/Psych weakness  Prior Studies Any changes  since last visit?  no  Physicians involved in your care Any changes since last visit?  no   No family history on file. Social History   Social History  . Marital status: Married    Spouse name: N/A  . Number of children: N/A  . Years of education: N/A   Social History Main Topics  . Smoking status: Former Smoker    Packs/day: 0.50    Years: 19.00    Types: Cigarettes    Quit date: 02/22/2000  . Smokeless tobacco: Never Used  . Alcohol use No     Comment: Wife reports chronic alcohol use.   . Drug use: No     Comment: teens to early 20's  . Sexual activity: Not Asked   Other Topics Concern  . None   Social History Narrative  . None   Past Surgical History:  Procedure Laterality Date  . TONSILLECTOMY AND ADENOIDECTOMY Bilateral    Age 47   Past Medical History:  Diagnosis Date  . Hypertension   . Liver damage   . Stroke (HCC)    BP 128/71   Pulse 60   Resp 16   SpO2 95%   Opioid Risk Score:   Fall Risk Score:  `1  Depression screen PHQ 2/9  Depression screen [facility] 2/9 05/19/2016 04/21/2016  Decreased Interest 0 0  Down, Depressed, Hopeless 0 0  PHQ - 2 Score 0 0    Review of Systems  All other systems reviewed and are negative.      Objective:   Physical Exam  Constitutional: He is oriented to person, place, and time. He  appears well-developed and well-nourished.  HENT:  Head: Normocephalic and atraumatic.  Eyes: Conjunctivae and EOM are normal. Pupils are equal, round, and reactive to light.  Musculoskeletal:       Left shoulder: He exhibits decreased range of motion, deformity and decreased strength. He exhibits no spasm.  1.5 fingerbreadths inferior and anterior subluxation of left glenohumeral joint.  Neurological: He is alert and oriented to person, place, and time. He displays atrophy. Coordination and gait abnormal.  0/5 in the left finger flexors and finger extensors as well as wrist flexors and extensors. Trace elbow flexion, 0 at the  deltoid Left lower limb. Trace hip flexion 3 minus, hip, knee extensor synergy. Trace ankle plantar flexor, 0 at the dorsiflexor. Right side is 5/5 in the upper limb and lower limb  Psychiatric: Thought content normal. His affect is blunt. His speech is delayed. He is slowed.  Nursing note and vitals reviewed.  Finger flexor tone, Ashworth grade 2       Assessment & Plan:  1. Left spastic hemiplegia with continued functional improvements during outpatient PT and OT. We discussed time course for recovery. He is approximately 4-5 months post-CVA. Still expect measurable gains in PT and OT over the next couple months.  Cognitive recovery, should improve over the course of the next 6 or 7 months  We discussed recovery as well as rehabilitation strategy, wrote a letter recommending twice a week PT and OT through the remainder of this year  Over half of the 25 min visit was spent counseling and coordinating care.

## 2016-05-23 ENCOUNTER — Encounter: Payer: Self-pay | Admitting: Neurology

## 2016-05-23 ENCOUNTER — Telehealth: Payer: Self-pay

## 2016-05-23 NOTE — Telephone Encounter (Signed)
Lft vm for patient that appeal letter was done for more rehab. Pt filled out release form requesting office notes, lab work, and [facility]. LFt vm that a fax number was not listed on the form.

## 2016-05-24 ENCOUNTER — Ambulatory Visit: Payer: 59 | Admitting: *Deleted

## 2016-05-24 ENCOUNTER — Ambulatory Visit: Payer: 59

## 2016-05-24 ENCOUNTER — Encounter: Payer: Self-pay | Admitting: *Deleted

## 2016-05-24 DIAGNOSIS — I69352 Hemiplegia and hemiparesis following cerebral infarction affecting left dominant side: Secondary | ICD-10-CM

## 2016-05-24 DIAGNOSIS — R278 Other lack of coordination: Secondary | ICD-10-CM

## 2016-05-24 DIAGNOSIS — I69318 Other symptoms and signs involving cognitive functions following cerebral infarction: Secondary | ICD-10-CM

## 2016-05-24 DIAGNOSIS — M6281 Muscle weakness (generalized): Secondary | ICD-10-CM

## 2016-05-24 NOTE — Therapy (Signed)
[facility] [address] [place], [place], [address] Phone: [phone]   Fax:  [phone]  Occupational Therapy Treatment  Patient Details  Name: Jon [name] MRN: [id number] Date of Birth: 01-Apr-1956 Referring Provider: Dr Claudette LawsAndrew Kirsteins  Encounter Date: 05/24/2016      OT End of Session - 05/24/16 1204    Visit Number 13   Number of Visits 16   Date for OT Re-Evaluation 05/30/16   Authorization Type [employer] [facility]([employer])    Authorization - Visit Number 13   Authorization - Number of Visits 20   OT Start Time 1016   OT Stop Time 1102   OT Time Calculation (min) 46 min   Activity Tolerance Patient tolerated treatment well   Behavior During Therapy [facility] for tasks assessed/performed;Flat affect      Past Medical History:  Diagnosis Date  . Hypertension   . Liver damage   . Stroke [facility](HCC)     Past Surgical History:  Procedure Laterality Date  . TONSILLECTOMY AND ADENOIDECTOMY Bilateral    Age 60    There were no vitals filed for this visit.      Subjective Assessment - 05/24/16 1023    Subjective  Pt reports that he is doing HEP "All the time, I try" Pt states doing some sort of exercise 2-3x/day.   Patient is accompained by: Family member   Patient Stated Goals Get my left arm going again and have more control of my left foot.   Currently in Pain? No/denies   Pain Score 0-No pain                      OT Treatments/Exercises (OP) - 05/24/16 0001      ADLs   Overall ADLs Pt reports that he cont to have difficulty donning socks on LLE - despite ADL strategies and 1 handed techniques. His wife assists PRN. Discussed strategies and techniques htat he should use.   ADL Comments Pt cont to report difficulty with 1 handed dressing techniques and wife assists PRN. He prefers to "work on my arm" in therapy and does ADL's at home on non-therapy days per pt/spouse report.     Shoulder  Exercises: Seated   Other Seated Exercises Seated for gentle LUE stretching at table top. Gentle PROM to digits and wrist for felxion/extension, composite fist and wrist flexion/extension.    Other Seated Exercises LUE on towel on table top while seated for gentle AROM/A/AROM for elbow flexion followed by A/AROM/PROM for elbow extension; Shoulder shruggs, scapula mobilization (Neuromuscular re-ed). Also focused on holding end range stretch for count of 5 seconds each.      Splinting   Splinting Stressed imporatance of use of L UE resting hand splint as pt reports not using - lack of carry over. "It's hard to wear at night, hard to sleep" Pt states that he sometimes uses during day "If my finger are not stiff" Pt/spouse were educated that pt can do self ROM L hand/fingers for flexion and extension multiple times per day to assist in increasing flexibility and then apply aplint. They verbalized understanding in clinic today.     Manual Therapy   Manual Therapy Soft tissue mobilization;Myofascial release   Myofascial Release Trigger point release and myofacial techniques LUE w/ focus on deltoid x8 min                OT Education - 05/24/16 1203    Education provided  Yes   Education Details Reinterated importance of carryover of home program for ADL's and HEP.    Person(s) Educated Patient;Spouse   Methods Explanation;Demonstration;Verbal cues;Tactile cues   Comprehension Verbalized understanding;Need further instruction          OT Short Term Goals - 05/05/16 1220      OT SHORT TERM GOAL #1   Title Pt will I state precautions/signs and symptoms of CVA   Baseline VC's   Time 4   Period Weeks   Status Achieved     OT SHORT TERM GOAL #2   Title Pt will demonstrate Mod I HEP for LUE self ROM    Baseline VC's and tc's   Time 4   Period Weeks   Status On-going     OT SHORT TERM GOAL #3   Title Pt will I state 2-3 edema control techniques LUE    Baseline VC's/Min A   Status  Deferred           OT Long Term Goals - 05/05/16 1221      OT LONG TERM GOAL #1   Title Pt will be Mod I upgrade HEP LUE   Time 8   Period Weeks   Status New     OT LONG TERM GOAL #2   Title Pt will be Mod I simple snack prep in ADL kitchen (using manual w/c vs hemi walker).   Time 8   Period Weeks   Status New     OT LONG TERM GOAL #3   Title Pt will be Mod I basic ADL transfers using hemi walker    Time 8   Period Weeks   Status New     OT LONG TERM GOAL #4   Title Pt will be supervision LB dressing w/ a/e or DME PRN, using 1 handed technique   Time 8   Period Weeks   Status New     OT LONG TERM GOAL #5   Title Pt will be supervision level simulated bathing UB/LB using 1 handed technique   Time 8   Period Weeks   Status New               Plan - 05/24/16 1205    Clinical Impression Statement Reinforced importance of conserving visits w/ pt/wife. Pt wife reports that she was unable to decrease visits therefore OT/PT added in "Follow up" care for front office staff to decreas to 1x/week. Pt is progressing slowly toward goals, should benefit from follow through with ADL's and home program.   Rehab Potential Fair   OT Frequency 2x / week   OT Duration 8 weeks   OT Treatment/Interventions Self-care/ADL training;Therapeutic exercise;DME and/or AE instruction;Building services engineerunctional Mobility Training;Therapeutic activities;Patient/family education;Splinting;Neuromuscular education;Electrical Stimulation;Passive range of motion;Therapeutic exercises;Visual/perceptual remediation/compensation   Plan Neuro-re-ed, NMES wrist/finger extensors, ADL's   Consulted and Agree with Plan of Care Patient;Family member/caregiver   Family Member Consulted Spouse      Patient will benefit from skilled therapeutic intervention in order to improve the following deficits and impairments:  Abnormal gait, Decreased balance, Decreased endurance, Difficulty walking, Impaired sensation, Impaired  vision/preception, Pain, Increased edema, Decreased range of motion, Decreased cognition, Decreased activity tolerance, Decreased coordination, Decreased knowledge of use of DME, Decreased safety awareness, Decreased strength, Impaired UE functional use  Visit Diagnosis: Hemiplegia and hemiparesis following cerebral infarction affecting left dominant side (HCC)  Other lack of coordination  Muscle weakness (generalized)  Other symptoms and signs involving cognitive functions following cerebral infarction  Problem List Patient Active Problem List   Diagnosis Date Noted  . Neuropathic pain of left shoulder 05/19/2016  . Alcohol use disorder, severe, dependence (HCC) 02/15/2016  . Muscle contusion 02/11/2016  . Left flaccid hemiparesis   . Muscle spasticity   . Poor appetite   . Hyperlipidemia 01/14/2016  . Depression 01/14/2016  . Obesity 01/14/2016  . Hyperglycemia 01/14/2016  . Urinary tract infection, site not specified 01/14/2016  . Basal ganglia hemorrhage (HCC) 01/14/2016  . Hemiparesis affecting nondominant side as late effect of cerebrovascular accident (HCC)   . Cognitive deficit, post-stroke   . Left hemiparesis (HCC)   . Essential hypertension   . Dysphagia, post-stroke   . Gait disturbance, post-stroke   . Hyponatremia   . Thrombocytopenia (HCC)   . Hemorrhagic stroke (HCC) R basal ganglia d/t HTN 01/08/2016    Barnhill, Amy Dionicio Stall , OTR/L 05/24/2016, 12:10 PM   [facility] [address] [place], [place], [address] Phone: [phone]   Fax:  [phone]  Name: Jon [name] MRN: [id number] Date of Birth: 04-05-1956

## 2016-05-24 NOTE — Telephone Encounter (Signed)
IF patients wife calls back I only fax Dr. Roda ShuttersXu office notes,and images only from [facility]. I cannot fax anything for therapy, or [place].IF [employer] needs copies of that he has to sign a release form at their facility. LFt vm on patients wife listed number [phone].

## 2016-05-24 NOTE — Telephone Encounter (Signed)
Garald Braverheresa  [facility]  fax # [phone] send to attention of Appeals  Her best call back is [phone]

## 2016-05-24 NOTE — Telephone Encounter (Signed)
Fax office notes, scans[facility] at [phone].

## 2016-05-24 NOTE — Therapy (Signed)
[facility] [address] [place], [place], [address] Phone: [phone]   Fax:  [phone]  Physical Therapy Treatment  Patient Details  Name: Jon [name] MRN: [id number] Date of Birth: 1955-09-05 Referring Provider: Dr. Letta Pate  Encounter Date: 05/24/2016      PT End of Session - 05/24/16 1302    Visit Number 15   Number of Visits 17   Date for PT Re-Evaluation 06/04/16   Authorization Type UHC and Medicare? G-CODE AND PROGRESS NOTE EVERY 10TH VISIT.    Authorization - Visit Number 15   Authorization - Number of Visits 20   PT Start Time 1102   PT Stop Time 1144   PT Time Calculation (min) 42 min   Equipment Utilized During Treatment Gait belt   Activity Tolerance Patient tolerated treatment well   Behavior During Therapy WFL for tasks assessed/performed      Past Medical History:  Diagnosis Date  . Hypertension   . Liver damage   . Stroke [facility])     Past Surgical History:  Procedure Laterality Date  . TONSILLECTOMY AND ADENOIDECTOMY Bilateral    Age 60    There were no vitals filed for this visit.      Subjective Assessment - 05/24/16 1107    Subjective Pt denied falls since last visit. Pt reported he has an MRI tomorrow.    Patient is accompained by: Family member  wife: Jon [name]   Pertinent History Pt goes by [name].   PMH significant for: HTN, ETOH abuse per pt's wife but pt has not consumed alcohol since ICH, liver damage   Patient Stated Goals Control ankle movement better and improve placement of the foot.    Currently in Pain? No/denies                         [facility] Adult PT Treatment/Exercise - 05/24/16 1135      Ambulation/Gait   Ambulation/Gait Yes   Ambulation/Gait Assistance 5: Supervision;4: Min guard   Ambulation/Gait Assistance Details Pt amb. wit hemi-walker and RW with R h/o with cues for sequencing and to improve stride length and upright posture. Pt also  amb. while negotiating cones (obstacles) and required cues to sidestep in order to not bump into cones (especially with L foot).    Ambulation Distance (Feet) 117 Feet  30' with hemi-walker and 75'x2 with RW   Assistive device Hemi-walker;Rolling walker  RW with h/o   Gait Pattern Step-through pattern;Decreased dorsiflexion - left;Decreased hip/knee flexion - left;Decreased stance time - left;Left flexed knee in stance;Lateral trunk lean to right;Trunk rotated posteriorly on left;Decreased weight shift to right;Decreased weight shift to left;Step-to pattern;Decreased step length - right   Ambulation Surface Level;Indoor    Pt also performed x5 reps/activity to improve stair negotiation, in // bars with 1 UE support. 4" step-up/downs with cues for technique ("up with the good, down with the bad"). Performed with min guard for safety and cues for wt. Shifting.            PT Education - 05/24/16 1300    Education provided Yes   Education Details PT adjusted pt's hemi-walker to incr. height, in order to improve posture and LLE stance time. PT re-educated pt on insurance visit limit and why PT/OT would like to reduce frequency to 1x/week vs. 2x/week in order to ensure pt continues to receive therapy (up to 20 visits per OT and PT), while continuing HEP at home. PT  educated pt and wife that he could perform step-ups/downs at home with exercise block on flat surfaces with handrails for safety.    Person(s) Educated Patient;Spouse   Methods Explanation;Demonstration;Verbal cues   Comprehension Verbalized understanding;Returned demonstration          PT Short Term Goals - 05/06/16 1331      PT SHORT TERM GOAL #1   Title Pt will verbalize understanding of risk factors and signs/sx's of CVA in order to reduce risk of addtional CVA. TARGET DATE FOR ALL STGS: 05/03/16   Status Partially Met     PT SHORT TERM GOAL #2   Title Perform BERG and write goal prn.    Baseline 9/13: Berg score = 9/56    Status Achieved     PT SHORT TERM GOAL #3   Title Pt will amb. 200' with LRAD and S over even terrain to improve functional mobility.    Baseline Met 10/13.   Status Achieved     PT SHORT TERM GOAL #4   Title Pt will improve gait speed with LRAD to >/=0.73f/sec. to improve functional mobility.    Baseline 10/13: gait velocity with RW and L hand orthosis = .38 ft/sec   Status Not Met     PT SHORT TERM GOAL #5   Title Pt will improve Berg score from 9/56 to 13/56 to indicate improved standing balance.    Status Achieved           PT Long Term Goals - 05/05/16 1240      PT LONG TERM GOAL #1   Title Pt will be IND in HEP to improve flexibility, strength, and balance. TARGET DATE FOR ALL LTGS: 05/31/16   Status On-going     PT LONG TERM GOAL #2   Title Pt will improve gait speed to >/=1.825fsec with LRAD to decr. falls risk.    Status On-going     PT LONG TERM GOAL #3   Title Pt will amb. 500' over even and paved surfaces at with LRAD at MOD I level to improve functional mobility.    Status On-going     PT LONG TERM GOAL #4   Title Pt will report zero falls over the last 4 weeks to improve safety during functional mobility.    Status On-going     PT LONG TERM GOAL #5   Title Pt will improve Berg score from 29/56 to 34/56 to indicate improved standing balance.    Baseline 29/56 on 05/05/16   Status Revised               Plan - 05/24/16 1303    Clinical Impression Statement Pt's endurance continues to improve, as he required only 1 seated rest break during session. Pt continues to require cues to improve L foot placement 2/2 impaired proprioception. Pt continues to require cues to improve LUE management on RW and for upright, midline posture. PT reducing to 1x/week 2/2 visit number limitations by insurance.    Rehab Potential Good   Clinical Impairments Affecting Rehab Potential co-morbidities   PT Frequency 2x / week   PT Duration 8 weeks   PT  Treatment/Interventions ADLs/Self Care Home Management;Biofeedback;Canalith Repostioning;Electrical Stimulation;Cognitive remediation;Neuromuscular re-education;Balance training;Therapeutic exercise;Therapeutic activities;Functional mobility training;Stair training;Gait training;DME Instruction;Orthotic Fit/Training;Patient/family education;Wheelchair mobility training;Manual techniques;Vestibular   PT Next Visit Plan Check goals and review/progress HEP prn. Continue Gait with RW; may continue to trial [facility] to improve proprioception. Curb step, ramp negotiation; obstacle negotiation indoors.   Consulted and Agree  with Plan of Care Patient;Family member/caregiver   Family Member Consulted wife-[name]      Patient will benefit from skilled therapeutic intervention in order to improve the following deficits and impairments:  Abnormal gait, Decreased endurance, Impaired sensation, Decreased knowledge of use of DME, Decreased strength, Impaired UE functional use, Decreased mobility, Decreased balance, Decreased range of motion, Decreased cognition, Impaired flexibility, Postural dysfunction  Visit Diagnosis: Hemiplegia and hemiparesis following cerebral infarction affecting left dominant side ([place])  Other lack of coordination     Problem List Patient Active Problem List   Diagnosis Date Noted  . Neuropathic pain of left shoulder 05/19/2016  . Alcohol use disorder, severe, dependence ([place]) 02/15/2016  . Muscle contusion 02/11/2016  . Left flaccid hemiparesis   . Muscle spasticity   . Poor appetite   . Hyperlipidemia 01/14/2016  . Depression 01/14/2016  . Obesity 01/14/2016  . Hyperglycemia 01/14/2016  . Urinary tract infection, site not specified 01/14/2016  . Basal ganglia hemorrhage ([place]) 01/14/2016  . Hemiparesis affecting nondominant side as late effect of cerebrovascular accident ([place])   . Cognitive deficit, post-stroke   . Left hemiparesis ([place])   . Essential hypertension    . Dysphagia, post-stroke   . Gait disturbance, post-stroke   . Hyponatremia   . Thrombocytopenia ([place])   . Hemorrhagic stroke ([place]) R basal ganglia d/t HTN 01/08/2016    Janesia Joswick L 05/24/2016, 1:06 PM  [facility] [address] [place], [place], [address] Phone: [phone]   Fax:  [phone]  Name: Jon [name] MRN: [id number] Date of Birth: 03/14/1956  Geoffry Paradise, PT,DPT 05/24/16 1:07 PM Phone: [phone] Fax: [phone]

## 2016-05-24 NOTE — Telephone Encounter (Signed)
Fax release form, [facility] Rod[facility]Xu [facility] and [facility] that were order, and appeal letter. [facility] Patient requested hospital discharge,and their lab work. [facility] cannot fax hospital records. [facility].

## 2016-05-25 ENCOUNTER — Ambulatory Visit (INDEPENDENT_AMBULATORY_CARE_PROVIDER_SITE_OTHER): Payer: 59

## 2016-05-25 DIAGNOSIS — I619 Nontraumatic intracerebral hemorrhage, unspecified: Secondary | ICD-10-CM | POA: Diagnosis not present

## 2016-05-26 ENCOUNTER — Ambulatory Visit: Payer: Self-pay

## 2016-05-26 ENCOUNTER — Encounter: Payer: Self-pay | Admitting: Occupational Therapy

## 2016-05-26 MED ORDER — GADOPENTETATE DIMEGLUMINE 469.01 MG/ML IV SOLN: 15.0000 mL | Freq: Once | INTRAVENOUS | Status: DC | PRN

## 2016-05-27 ENCOUNTER — Other Ambulatory Visit: Payer: Self-pay | Admitting: Neurology

## 2016-05-27 DIAGNOSIS — I610 Nontraumatic intracerebral hemorrhage in hemisphere, subcortical: Secondary | ICD-10-CM

## 2016-05-27 MED ORDER — ASPIRIN EC 81 MG PO TBEC: 81.0000 mg | DELAYED_RELEASE_TABLET | Freq: Every day | ORAL | Status: DC

## 2016-05-27 NOTE — Progress Notes (Signed)
SA

## 2016-05-30 ENCOUNTER — Telehealth: Payer: Self-pay

## 2016-05-30 ENCOUNTER — Ambulatory Visit: Payer: 59 | Attending: Physical Medicine & Rehabilitation | Admitting: *Deleted

## 2016-05-30 ENCOUNTER — Encounter: Payer: Self-pay | Admitting: Rehabilitation

## 2016-05-30 ENCOUNTER — Ambulatory Visit: Payer: 59 | Admitting: Rehabilitation

## 2016-05-30 DIAGNOSIS — I69352 Hemiplegia and hemiparesis following cerebral infarction affecting left dominant side: Secondary | ICD-10-CM | POA: Insufficient documentation

## 2016-05-30 DIAGNOSIS — M6281 Muscle weakness (generalized): Secondary | ICD-10-CM | POA: Diagnosis present

## 2016-05-30 DIAGNOSIS — R41841 Cognitive communication deficit: Secondary | ICD-10-CM | POA: Diagnosis present

## 2016-05-30 DIAGNOSIS — I69318 Other symptoms and signs involving cognitive functions following cerebral infarction: Secondary | ICD-10-CM | POA: Insufficient documentation

## 2016-05-30 DIAGNOSIS — R471 Dysarthria and anarthria: Secondary | ICD-10-CM | POA: Diagnosis present

## 2016-05-30 DIAGNOSIS — R278 Other lack of coordination: Secondary | ICD-10-CM | POA: Diagnosis present

## 2016-05-30 NOTE — Telephone Encounter (Signed)
LFt vm for patient or wife to call back about MRI results. Rn also left vm that patient needs to start taking aspirin 81mg  for stroke prevention.

## 2016-05-30 NOTE — Therapy (Signed)
[facility] [address] [place], [place], [address] Phone: [phone]   Fax:  [phone]  Speech Language Pathology Treatment  Patient Details  Name: Jon [name] MRN: [id number] Date of Birth: 02-18-1956 Referring Provider: Dr. Claudette LawsAndrew Kirsteins   Encounter Date: 05/30/2016      End of Session - 05/30/16 1626    Visit Number 8   Number of Visits 17   Date for SLP Re-Evaluation 05/31/16   Authorization Type 20 ST    Authorization - Visit Number 7   Authorization - Number of Visits 20   SLP Start Time 1448   SLP Stop Time  1530   SLP Time Calculation (min) 42 min   Activity Tolerance Patient tolerated treatment well      Past Medical History:  Diagnosis Date  . Hypertension   . Liver damage   . Stroke [facility](HCC)     Past Surgical History:  Procedure Laterality Date  . TONSILLECTOMY AND ADENOIDECTOMY Bilateral    Age 60    There were no vitals filed for this visit.      Subjective Assessment - 05/30/16 1624    Subjective "This is the most work I do all week."   Patient is accompained by: Family member  spouse   Currently in Pain? No/denies               ADULT SLP TREATMENT - 05/30/16 1624      General Information   Behavior/Cognition Cooperative;Pleasant mood;Distractible;Requires cueing   Patient Positioning Upright in chair     Treatment Provided   Treatment provided Cognitive-Linquistic     Cognitive-Linquistic Treatment   Treatment focused on Cognition   Skilled Treatment Pt able to explain homework with min A to attend to details. Min A for organization of details in a complex reasoning task and increased time for information processing.     Assessment / Recommendations / Plan   Plan Continue with current plan of care     Progression Toward Goals   Progression toward goals Progressing toward goals            SLP Short Term Goals - 05/30/16 1628      SLP SHORT TERM GOAL  #1   Title Pt will selectively attend to details on mildly complex cognitive linguistic task with distractions and 85% accuracy.   Status On-going     SLP SHORT TERM GOAL #2   Title Pt will perform mildy complex organization, reasoning and time/money problems with 85% accuracy and rare min A   Status On-going     SLP SHORT TERM GOAL #3   Title Pt  will utlize external memory aids/book to recall tasks, errands, lists, and reduce repetitive question asking/frustration over 3 sessions with occasional min A.   Time 1   Period Weeks   Status On-going          SLP Long Term Goals - 05/13/16 1024      SLP LONG TERM GOAL #1   Title Pt will alterate attention between 2 mildly complex cognitive linguistic tasks with 85% on each and occasional min A   Time 7   Period Weeks   Status On-going     SLP LONG TERM GOAL #2   Title Pt will solve moderately complex reasoning, functional math and organization problems with 90% accuracy and occasional min A   Time 7   Period Weeks   Status On-going     SLP LONG TERM GOAL #  3   Title Pt will utilize external aids for medication management with rare min A over 3 sessions.   Time 7   Period Weeks   Status On-going     SLP LONG TERM GOAL #4   Title pt will complete HEP for dysarthria with rare min A over two sessions   Time 7   Period Weeks   Status On-going          Plan - 05/30/16 1626    Clinical Impression Statement Pt improving attention with increased complexity of work. Continues to require ongoing cues to attend to details and consistently manage information. Continue skilled ST to maximize cognition for improved independence.    Speech Therapy Frequency 2x / week   Treatment/Interventions Compensatory strategies;Functional tasks;Patient/family education;Cueing hierarchy;Cognitive reorganization;Internal/external aids;SLP instruction and feedback   Potential to Achieve Goals Good   Potential Considerations Severity of impairments    Consulted and Agree with Plan of Care Patient;Family member/caregiver   Family Member Consulted spouse, [name]      Patient will benefit from skilled therapeutic intervention in order to improve the following deficits and impairments:   Cognitive communication deficit    Problem List Patient Active Problem List   Diagnosis Date Noted  . Neuropathic pain of left shoulder 05/19/2016  . Alcohol use disorder, severe, dependence ([facility]) 02/15/2016  . Muscle contusion 02/11/2016  . Left flaccid hemiparesis   . Muscle spasticity   . Poor appetite   . Hyperlipidemia 01/14/2016  . Depression 01/14/2016  . Obesity 01/14/2016  . Hyperglycemia 01/14/2016  . Urinary tract infection, site not specified 01/14/2016  . Basal ganglia hemorrhage ([facility]) 01/14/2016  . Hemiparesis affecting nondominant side as late effect of cerebrovascular accident ([facility])   . Cognitive deficit, post-stroke   . Left hemiparesis ([facility])   . Essential hypertension   . Dysphagia, post-stroke   . Gait disturbance, post-stroke   . Hyponatremia   . Thrombocytopenia ([facility])   . Hemorrhagic stroke ([facility]) R basal ganglia d/t HTN 01/08/2016    Rocky CraftsKara E Alison Breeding MA, CCC-SLP 05/30/2016, 4:29 PM  [facility] [address] [place], [place], [address] Phone: [phone]   Fax:  [phone]   Name: Jon [name] MRN: [id number] Date of Birth: 1955-11-21

## 2016-05-30 NOTE — Telephone Encounter (Signed)
-----   Message from Marvel PlanJindong Xu, MD sent at 05/27/2016  3:11 PM EDT ----- Could you please let the patient know that the MRI brain done recently showed no underlying tumor or other structural lesion. Will recommend to start baby aspirin 81mg  daily for stroke prevention. It can be obtained over the counter. Do not hesitate to call us back if there is any questions. Thanks.  Marvel PlanJindong Xu, MD PhD Stroke Neurology 05/27/2016 3:11 PM

## 2016-05-30 NOTE — Telephone Encounter (Signed)
LM (per DPR) with results and recommendations.  

## 2016-05-30 NOTE — Therapy (Signed)
[facility] [address] [place], [place], [address] Phone: [phone]   Fax:  [phone]  Physical Therapy Treatment  Patient Details  Name: Jon [name] MRN: [id number] Date of Birth: November 05, 1955 Referring Provider: Dr. Letta Pate  Encounter Date: 05/30/2016      PT End of Session - 05/30/16 2014    Visit Number 16   Number of Visits 17   Date for PT Re-Evaluation 06/04/16   Authorization Type UHC and Medicare? G-CODE AND PROGRESS NOTE EVERY 10TH VISIT.    Authorization - Visit Number 16   Authorization - Number of Visits 20   PT Start Time 8676   PT Stop Time 1620   PT Time Calculation (min) 49 min   Equipment Utilized During Treatment Gait belt   Activity Tolerance Patient tolerated treatment well   Behavior During Therapy WFL for tasks assessed/performed      Past Medical History:  Diagnosis Date  . Hypertension   . Liver damage   . Stroke [facility])     Past Surgical History:  Procedure Laterality Date  . TONSILLECTOMY AND ADENOIDECTOMY Bilateral    Age 60    There were no vitals filed for this visit.      Subjective Assessment - 05/30/16 1542    Subjective States MRI was okay, no other changes.    Patient is accompained by: Family member   Pertinent History Pt goes by [name].   PMH significant for: HTN, ETOH abuse per pt's wife but pt has not consumed alcohol since ICH, liver damage   Patient Stated Goals Control ankle movement better and improve placement of the foot.    Currently in Pain? No/denies                         [facility] Adult PT Treatment/Exercise - 05/30/16 0001      Transfers   Transfers Sit to Stand;Stand to Sit   Sit to Stand 4: Min assist  for facilitation   Sit to Stand Details Verbal cues for sequencing;Verbal cues for technique;Tactile cues for initiation;Tactile cues for weight shifting;Manual facilitation for weight shifting;Manual facilitation for weight  bearing   Sit to Stand Details (indicate cue type and reason) Cues for improved midline stance, anterior weight shift and increased LLE WB.    Stand to Sit 4: Min assist  for facilitation   Stand to Sit Details (indicate cue type and reason) Verbal cues for sequencing;Verbal cues for technique;Manual facilitation for weight shifting;Manual facilitation for weight bearing;Verbal cues for precautions/safety     Ambulation/Gait   Ambulation/Gait Yes   Ambulation/Gait Assistance 5: Supervision;4: Min assist  intermittent min A for facilitation   Ambulation/Gait Assistance Details Addressed LTG for outdoor gait.  Pt able to ambulate x 500' with RW and L hand orthosis at S level.  Provided intermitttent min A for decreased L trunk rotation, improved posture and increased L hip protraction during stance.  Provided cues for this during gait, however pt demonstrates decreased carryover.  Also attempted to have pt advance LEs in smoother gait pattern, however he continues to demonstrate step to vs slight step through technique.  Also note pt requires increased time to complete gait task due to very slow gait speed, despite cues to increase speed slightly.     Ambulation Distance (Feet) 500 Feet   Assistive device Rolling walker  L AFO, L hand orthosis   Gait Pattern Step-through pattern;Decreased dorsiflexion - left;Decreased hip/knee flexion -  left;Decreased stance time - left;Left flexed knee in stance;Lateral trunk lean to right;Trunk rotated posteriorly on left;Decreased weight shift to right;Decreased weight shift to left;Step-to pattern;Decreased step length - right   Ambulation Surface Level;Unlevel;Indoor;Outdoor;Paved     Self-Care   Self-Care Other Self-Care Comments   Other Self-Care Comments  Continued education on importance and benefits of using RW for improved midline orientation, LUE WB, and safety at home when negotiating through tight spaces.  Pts wife seems to go along with pts comments  that hemi walker is "more comfortable" despite benefits of ambulating with RW.       Exercises   Other Exercises  L hip flex stretch off EOM x 2 mins with education to wife on how to assist to increase stretch.                  PT Education - 05/30/16 2013    Education provided Yes   Education Details Educated on importance/benefits of using RW during gait, increasing activity at home, hip flex stretch for LLE off EOB.     Person(s) Educated Patient;Spouse   Methods Explanation   Comprehension Verbalized understanding          PT Short Term Goals - 05/06/16 1331      PT SHORT TERM GOAL #1   Title Pt will verbalize understanding of risk factors and signs/sx's of CVA in order to reduce risk of addtional CVA. TARGET DATE FOR ALL STGS: 05/03/16   Status Partially Met     PT SHORT TERM GOAL #2   Title Perform BERG and write goal prn.    Baseline 9/13: Berg score = 9/56   Status Achieved     PT SHORT TERM GOAL #3   Title Pt will amb. 200' with LRAD and S over even terrain to improve functional mobility.    Baseline Met 10/13.   Status Achieved     PT SHORT TERM GOAL #4   Title Pt will improve gait speed with LRAD to >/=0.42f/sec. to improve functional mobility.    Baseline 10/13: gait velocity with RW and L hand orthosis = .38 ft/sec   Status Not Met     PT SHORT TERM GOAL #5   Title Pt will improve Berg score from 9/56 to 13/56 to indicate improved standing balance.    Status Achieved           PT Long Term Goals - 05/05/16 1240      PT LONG TERM GOAL #1   Title Pt will be IND in HEP to improve flexibility, strength, and balance. TARGET DATE FOR ALL LTGS: 05/31/16   Status On-going     PT LONG TERM GOAL #2   Title Pt will improve gait speed to >/=1.811fsec with LRAD to decr. falls risk.    Status On-going     PT LONG TERM GOAL #3   Title Pt will amb. 500' over even and paved surfaces at with LRAD at MOD I level to improve functional mobility.    Status  On-going     PT LONG TERM GOAL #4   Title Pt will report zero falls over the last 4 weeks to improve safety during functional mobility.    Status On-going     PT LONG TERM GOAL #5   Title Pt will improve Berg score from 29/56 to 34/56 to indicate improved standing balance.    Baseline 29/56 on 05/05/16   Status Revised  Plan - 05/30/16 2015    Clinical Impression Statement Skilled session focused on gait over outdoor surfaces for longer distances to work towards LTG.  Pt making good progress, but still recommend S to min/guard for outdoor surfaces.  Pts wife wanting to know if he can walk at mod I level into clinic.  Will discuss with primary PT.     Rehab Potential Good   Clinical Impairments Affecting Rehab Potential co-morbidities   PT Frequency 2x / week   PT Duration 8 weeks   PT Treatment/Interventions ADLs/Self Care Home Management;Biofeedback;Canalith Repostioning;Electrical Stimulation;Cognitive remediation;Neuromuscular re-education;Balance training;Therapeutic exercise;Therapeutic activities;Functional mobility training;Stair training;Gait training;DME Instruction;Orthotic Fit/Training;Patient/family education;Wheelchair mobility training;Manual techniques;Vestibular   PT Next Visit Plan Jen/Blair:  Wife wanting to know if he is safe to ambulate from car to clinic by himself or can he stand at entrance while waiting on wife to park.  Check goals and review/progress HEP prn. Continue Gait with RW; may continue to trial [facility]. SLS to improve proprioception. Curb step, ramp negotiation; obstacle negotiation indoors.   Consulted and Agree with Plan of Care Patient;Family member/caregiver   Family Member Consulted wife-[name]      Patient will benefit from skilled therapeutic intervention in order to improve the following deficits and impairments:  Abnormal gait, Decreased endurance, Impaired sensation, Decreased knowledge of use of DME, Decreased strength,  Impaired UE functional use, Decreased mobility, Decreased balance, Decreased range of motion, Decreased cognition, Impaired flexibility, Postural dysfunction  Visit Diagnosis: Hemiplegia and hemiparesis following cerebral infarction affecting left dominant side (HCC)  Muscle weakness (generalized)  Other symptoms and signs involving cognitive functions following cerebral infarction  Other lack of coordination     Problem List Patient Active Problem List   Diagnosis Date Noted  . Neuropathic pain of left shoulder 05/19/2016  . Alcohol use disorder, severe, dependence ([place]) 02/15/2016  . Muscle contusion 02/11/2016  . Left flaccid hemiparesis   . Muscle spasticity   . Poor appetite   . Hyperlipidemia 01/14/2016  . Depression 01/14/2016  . Obesity 01/14/2016  . Hyperglycemia 01/14/2016  . Urinary tract infection, site not specified 01/14/2016  . Basal ganglia hemorrhage ([place]) 01/14/2016  . Hemiparesis affecting nondominant side as late effect of cerebrovascular accident ([place])   . Cognitive deficit, post-stroke   . Left hemiparesis ([place])   . Essential hypertension   . Dysphagia, post-stroke   . Gait disturbance, post-stroke   . Hyponatremia   . Thrombocytopenia ([employer])   . Hemorrhagic stroke ([place]) R basal ganglia d/t HTN 01/08/2016    Cameron Sprang, PT, MPT [facility] [address] [place], [place], [address] Phone: [phone]   Fax:  [phone] 05/30/16, 8:39 PM  Name: Jon [name] MRN: [id number] Date of Birth: [birth year]

## 2016-05-31 ENCOUNTER — Ambulatory Visit: Payer: 59 | Admitting: Occupational Therapy

## 2016-05-31 ENCOUNTER — Ambulatory Visit: Payer: 59 | Admitting: *Deleted

## 2016-05-31 DIAGNOSIS — I69352 Hemiplegia and hemiparesis following cerebral infarction affecting left dominant side: Secondary | ICD-10-CM

## 2016-05-31 DIAGNOSIS — M6281 Muscle weakness (generalized): Secondary | ICD-10-CM

## 2016-05-31 DIAGNOSIS — R41841 Cognitive communication deficit: Secondary | ICD-10-CM | POA: Diagnosis not present

## 2016-05-31 DIAGNOSIS — R278 Other lack of coordination: Secondary | ICD-10-CM

## 2016-05-31 DIAGNOSIS — R471 Dysarthria and anarthria: Secondary | ICD-10-CM

## 2016-05-31 DIAGNOSIS — I69318 Other symptoms and signs involving cognitive functions following cerebral infarction: Secondary | ICD-10-CM

## 2016-05-31 NOTE — Telephone Encounter (Signed)
Wife showed up in the lobby today but left before I could give her the results. She told the front desk that she will be home between 2-3pm.

## 2016-05-31 NOTE — Telephone Encounter (Signed)
Rn gave wife the MRI results. The  MRI brain done recently showed no underlying tumor or other structural lesion. Will recommend to start baby aspirin 81mg  daily for stroke prevention. It can be obtained over the counter. Do not hesitate to call us back if there is any questions. Thanks. Pts wife verbalized understanding.

## 2016-05-31 NOTE — Therapy (Signed)
[facility] [address] [place], [place], [address] Phone: [phone]   Fax:  [phone]  Speech Language Pathology Treatment  Patient Details  Name: Jon [name] MRN: [id number] Date of Birth: 02/01/1956 Referring Provider: Dr. Claudette Laws   Encounter Date: 05/31/2016      End of Session - 05/31/16 1139    Visit Number 9   Number of Visits 17   Date for SLP Re-Evaluation 05/31/16   SLP Start Time 0935   SLP Stop Time  1020   SLP Time Calculation (min) 45 min   Activity Tolerance Patient tolerated treatment well      Past Medical History:  Diagnosis Date  . Hypertension   . Liver damage   . Stroke [facility])     Past Surgical History:  Procedure Laterality Date  . TONSILLECTOMY AND ADENOIDECTOMY Bilateral    Age 60    There were no vitals filed for this visit.      Subjective Assessment - 05/31/16 1131    Subjective I don't like homework   Patient is accompained by: Family member  [name]   Currently in Pain? No/denies               ADULT SLP TREATMENT - 05/31/16 0001      General Information   Behavior/Cognition Alert;Cooperative;Pleasant mood   Patient Positioning Upright in chair     Treatment Provided   Treatment provided Cognitive-Linquistic     Cognitive-Linquistic Treatment   Treatment focused on Cognition   Skilled Treatment Pt seen for skilled ST session focusing on attention, organization, reasoning, problem solving. When asked what were primary deficits affecting him, pt reported he did not like home work, because he has "enough to occupy my time". He indicated he forgets what day it is. He has a wall calendar at home, and has a calendar on his phone. He was encouraged to use a portable pocket-size calendar to facilitate orientation if that would be more beneficial than referring to his phone. Pt indicated he was happy that his speech is clear and not slurred, but  indicated difficulty hearing in his left ear. Pt was encouraged to consider audiological evaluation to assess hearing status. Pt verbalized a typical day, and was encouraged to identify what he would LIKE to be able to do. Pt reported wanting to cook, play his guitar, do yardwork, take a regular shower, and put socks on. Pt was encouraged to plan one meal, including all aspects of items needed, steps required, etc for a functional activity at home targeting cognitive goals.      Assessment / Recommendations / Plan   Plan Continue with current plan of care     Progression Toward Goals   Progression toward goals Progressing toward goals            SLP Short Term Goals - 05/31/16 1144      SLP SHORT TERM GOAL #1   Title Pt will selectively attend to details on mildly complex cognitive linguistic task with distractions and 85% accuracy.   Status On-going     SLP SHORT TERM GOAL #2   Title Pt will perform mildy complex organization, reasoning and time/money problems with 85% accuracy and rare min A   Status On-going     SLP SHORT TERM GOAL #3   Title Pt  will utilize external memory aids/book to recall tasks, errands, lists, and reduce repetitive question asking/frustration over 3 sessions with occasional min A.  Time 1   Period Weeks   Status On-going     SLP SHORT TERM GOAL #4   Title Pt will face and maintain eye contact conversation partner on his left side with rare min A for 5 minutes conversation.    Status On-going     SLP SHORT TERM GOAL #5   Title pt will complete HEP with occasional min A (for dysarthria)   Status Achieved          SLP Long Term Goals - 05/31/16 1144      SLP LONG TERM GOAL #1   Title Pt will alterate attention between 2 mildly complex cognitive linguistic tasks with 85% on each and occasional min A   Time 5   Period Weeks   Status On-going     SLP LONG TERM GOAL #2   Title Pt will solve moderately complex reasoning, functional math and  organization problems with 90% accuracy and occasional min A   Time 5   Period Weeks   Status On-going     SLP LONG TERM GOAL #3   Title Pt will utilize external aids for medication management with rare min A over 3 sessions.   Time 5   Period Weeks   Status On-going     SLP LONG TERM GOAL #4   Title pt will complete HEP for dysarthria with rare min A over two sessions   Time 5   Period Weeks   Status On-going          Plan - 05/31/16 1140    Clinical Impression Statement Pt noted to have flat affect and poor eye contact, which he states is baseline for him. Left inattention also noted, with min-mod cues required to attend to left of midline. Pt reports reading an article on ocular migraines, which may be a precursor to stroke. Pt reports not liking homework assignments, which may adversely affect functional carryover of goals addressed in therapy sessions. Homework assignment given today was more in line with a functional activity he enjoys, with incorporation of cognitive skills being addressed in session.    Speech Therapy Frequency 2x / week   Duration 4 weeks   Treatment/Interventions Compensatory strategies;Functional tasks;Patient/family education;Cueing hierarchy;Cognitive reorganization;Internal/external aids;SLP instruction and feedback   Potential to Achieve Goals Good   Potential Considerations Severity of impairments   Consulted and Agree with Plan of Care Patient;Family member/caregiver   Family Member Consulted spouse, [name]      Patient will benefit from skilled therapeutic intervention in order to improve the following deficits and impairments:   Cognitive communication deficit  Dysarthria and anarthria    Problem List Patient Active Problem List   Diagnosis Date Noted  . Neuropathic pain of left shoulder 05/19/2016  . Alcohol use disorder, severe, dependence (HCC) 02/15/2016  . Muscle contusion 02/11/2016  . Left flaccid hemiparesis   . Muscle  spasticity   . Poor appetite   . Hyperlipidemia 01/14/2016  . Depression 01/14/2016  . Obesity 01/14/2016  . Hyperglycemia 01/14/2016  . Urinary tract infection, site not specified 01/14/2016  . Basal ganglia hemorrhage (HCC) 01/14/2016  . Hemiparesis affecting nondominant side as late effect of cerebrovascular accident (HCC)   . Cognitive deficit, post-stroke   . Left hemiparesis (HCC)   . Essential hypertension   . Dysphagia, post-stroke   . Gait disturbance, post-stroke   . Hyponatremia   . Thrombocytopenia (HCC)   . Hemorrhagic stroke (HCC) R basal ganglia d/t HTN 01/08/2016   Evonne Rinks B. Nadie Fiumara,  MSP, CCC-SLP  Leigh AuroraBueche, Toniqua Melamed Brown 05/31/2016, 11:46 AM  [facility] [address] [place], [place], [address] Phone: [phone]   Fax:  [phone]   Name: Jon CoolerGeorge D [name] MRN: [id number] Date of Birth: Feb 24, 1956

## 2016-05-31 NOTE — Therapy (Signed)
[facility] [address] [place], [place], [address] Phone: [phone]   Fax:  [phone]  Occupational Therapy Treatment  Patient Details  Name: Jon [name] MRN: [id number] Date of Birth: 1955-09-06 Referring Provider: Dr Alysia Penna  Encounter Date: 05/31/2016      OT End of Session - 05/31/16 1243    Visit Number 14   Number of Visits 22   Date for OT Re-Evaluation 07/31/16   Authorization Type UHC [facility])    Authorization - Visit Number 14   Authorization - Number of Visits 20   OT Start Time 1021   OT Stop Time 1104   OT Time Calculation (min) 43 min   Activity Tolerance Patient tolerated treatment well   Behavior During Therapy [facility] for tasks assessed/performed      Past Medical History:  Diagnosis Date  . Hypertension   . Liver damage   . Stroke [facility])     Past Surgical History:  Procedure Laterality Date  . TONSILLECTOMY AND ADENOIDECTOMY Bilateral    Age 60    There were no vitals filed for this visit.      Subjective Assessment - 05/31/16 1258    Subjective  Pt reports exercising LUE daily   Patient Stated Goals Get my left arm going again and have more control of my left foot.   Currently in Pain? Yes   Pain Score 3    Pain Location Arm   Pain Orientation Left   Pain Descriptors / Indicators Aching   Pain Type Neuropathic pain   Pain Onset More than a month ago   Pain Frequency Intermittent   Aggravating Factors  malpositioning   Pain Relieving Factors repositioning       Therapist checked progress towards goals. Pt/ wife would like for therapy to continue. Gentle P/ROM stretch to wrist and digits with gentle wrist mobs. Weightbearing edge of mat and through tilted stool with mod facilitation for posture and weightbearing, pt demonstrates improved overall selective attention and improving L side awareness. Pt demonstrates grossly 20% A/ROM elbow flexion and  beginning trace finger flexion 10%.                         OT Short Term Goals - 05/31/16 1245      OT SHORT TERM GOAL #1   Title Pt will I state precautions/signs and symptoms of CVA   Baseline VC's   Time 4   Period Weeks   Status Achieved     OT SHORT TERM GOAL #2   Title Pt will demonstrate Mod I HEP for LUE self ROM    Baseline VC's and tc's   Time 4   Period Weeks   Status Partially Met  Pt requires v.c./ assist from wife     OT SHORT TERM GOAL #3   Title Pt will I state 2-3 edema control techniques LUE    Baseline VC's/Min A   Status Deferred     OT SHORT TERM GOAL #4   Title Pt will use LUE as a stabilizer 10% of the time for ADLS/ IADLs.   Time 4   Period Weeks   Status New     OT SHORT TERM GOAL #5   Title Pt will attend to left hemibody space with no more than min v.c. 75% of the time.   Time 4   Period Weeks   Status New     OT  SHORT TERM GOAL #6   Title Pt will verbalize understanding of proper LUE positioning to minimze risk for injury/ contracture.   Time 4   Period Weeks   Status New     OT SHORT TERM GOAL #7   Title Pt will demonstrate ability to grasp /release a cylindrical cone 2/3 trials with LUe in prep for functional use of LUE.   Time 4   Period Weeks   Status New           OT Long Term Goals - 05/31/16 1246      OT LONG TERM GOAL #1   Title Pt will be Mod I upgrade HEP LUE   Time 8   Period Weeks   Status On-going     OT LONG TERM GOAL #2   Title Pt will be Mod I simple snack prep in ADL kitchen (using manual w/c vs hemi walker).   Time 8   Period Weeks   Status Achieved  achieved at a w/c level per wife     OT LONG TERM GOAL #3   Title Pt will be Mod I basic ADL transfers using hemi walker    Time 8   Period Weeks   Status On-going  needs supervison with walker     OT LONG TERM GOAL #4   Title Pt will be supervision LB dressing w/ a/e or DME PRN, using 1 handed technique   Time 8   Period  Weeks   Status Achieved  met, per pt/ wife     OT LONG TERM GOAL #5   Title Pt will be supervision level simulated bathing UB/LB using 1 handed technique   Time 8   Period Weeks   Status On-going  min A     OT LONG TERM GOAL #6   Title Pt will use LUE as a stabilizer/ gross A 25% of the time for ADLs/ IADLs with pain no greater than 3/10.   Time 8   Period Weeks   Status New     OT LONG TERM GOAL #7   Title Pt will demonstrate 50% elbow flexion and 25 % elbow extension for LUE in prep for functional use.   Time 8   Period Weeks   Status New     OT LONG TERM GOAL #8   Title --               Plan - 05/31/16 1239    Clinical Impression Statement Pt is progressing towards goals. Pt/ wife report pt dressed himself with supervision/ set up today. Pt can benefit from continued skilled occupational therapy to maximize safety and indpendence with ADLS/IADLS and to encourage use of LUE as a stabilizer.   Rehab Potential Fair   OT Frequency 2x / week   OT Duration 8 weeks   OT Treatment/Interventions Self-care/ADL training;Therapeutic exercise;DME and/or AE instruction;Therapist, nutritional;Therapeutic activities;Patient/family education;Splinting;Neuromuscular education;Electrical Stimulation;Passive range of motion;Therapeutic exercises;Visual/perceptual remediation/compensation   Plan neuro re-ed, NMES   Consulted and Agree with Plan of Care Patient   Family Member Consulted Spouse      Patient will benefit from skilled therapeutic intervention in order to improve the following deficits and impairments:  Abnormal gait, Decreased balance, Decreased endurance, Difficulty walking, Impaired sensation, Impaired vision/preception, Pain, Increased edema, Decreased range of motion, Decreased cognition, Decreased activity tolerance, Decreased coordination, Decreased knowledge of use of DME, Decreased safety awareness, Decreased strength, Impaired UE functional use  Visit  Diagnosis: Hemiplegia and hemiparesis following cerebral  infarction affecting left dominant side (HCC)  Muscle weakness (generalized)  Other symptoms and signs involving cognitive functions following cerebral infarction  Other lack of coordination    Problem List Patient Active Problem List   Diagnosis Date Noted  . Neuropathic pain of left shoulder 05/19/2016  . Alcohol use disorder, severe, dependence ([place]) 02/15/2016  . Muscle contusion 02/11/2016  . Left flaccid hemiparesis   . Muscle spasticity   . Poor appetite   . Hyperlipidemia 01/14/2016  . Depression 01/14/2016  . Obesity 01/14/2016  . Hyperglycemia 01/14/2016  . Urinary tract infection, site not specified 01/14/2016  . Basal ganglia hemorrhage ([place]) 01/14/2016  . Hemiparesis affecting nondominant side as late effect of cerebrovascular accident ([place])   . Cognitive deficit, post-stroke   . Left hemiparesis ([place])   . Essential hypertension   . Dysphagia, post-stroke   . Gait disturbance, post-stroke   . Hyponatremia   . Thrombocytopenia ([place])   . Hemorrhagic stroke ([place]) R basal ganglia d/t HTN 01/08/2016    RINE,KATHRYN 05/31/2016, 12:59 PM  [facility] [address] [place], [place], [address] Phone: [phone]   Fax:  [phone]  Name: Jon [name] MRN: [id number] Date of Birth: February 13, 1956

## 2016-06-06 ENCOUNTER — Ambulatory Visit: Payer: 59 | Admitting: *Deleted

## 2016-06-06 ENCOUNTER — Encounter: Payer: Self-pay | Admitting: *Deleted

## 2016-06-06 ENCOUNTER — Ambulatory Visit: Payer: 59 | Admitting: Physical Therapy

## 2016-06-06 DIAGNOSIS — I69352 Hemiplegia and hemiparesis following cerebral infarction affecting left dominant side: Secondary | ICD-10-CM

## 2016-06-06 DIAGNOSIS — R41841 Cognitive communication deficit: Secondary | ICD-10-CM | POA: Diagnosis not present

## 2016-06-06 DIAGNOSIS — R278 Other lack of coordination: Secondary | ICD-10-CM

## 2016-06-06 DIAGNOSIS — I69318 Other symptoms and signs involving cognitive functions following cerebral infarction: Secondary | ICD-10-CM

## 2016-06-06 DIAGNOSIS — M6281 Muscle weakness (generalized): Secondary | ICD-10-CM

## 2016-06-06 NOTE — Therapy (Signed)
[facility] [address] [place], [place], [address] Phone: [phone]   Fax:  [phone]  Occupational Therapy Treatment  Patient Details  Name: Jon [name] MRN: [id number] Date of Birth: 12-03-1955 Referring Provider: Dr Alysia Penna  Encounter Date: 06/06/2016      OT End of Session - 06/06/16 1214    Visit Number 15   Number of Visits 22   Date for OT Re-Evaluation 07/31/16   Authorization Type UHC [facility])    Authorization Time Period renewal completed 05/31/16   Authorization - Visit Number 15   Authorization - Number of Visits 20   OT Start Time 5701   OT Stop Time 1105   OT Time Calculation (min) 50 min   Activity Tolerance Patient tolerated treatment well   Behavior During Therapy [facility] for tasks assessed/performed      Past Medical History:  Diagnosis Date  . Hypertension   . Liver damage   . Stroke [facility])     Past Surgical History:  Procedure Laterality Date  . TONSILLECTOMY AND ADENOIDECTOMY Bilateral    Age 60    There were no vitals filed for this visit.      Subjective Assessment - 06/06/16 1110    Subjective  Pt reports that he is doing exercises for LUE "all the time"/daily.   Patient is accompained by: Family member   Patient Stated Goals Get my left arm going again and have more control of my left foot.   Currently in Pain? Yes   Pain Score 1    Pain Location Arm   Pain Orientation Left   Pain Descriptors / Indicators Aching   Pain Type Neuropathic pain   Pain Onset More than a month ago   Pain Frequency Intermittent   Aggravating Factors  Malpositioning   Pain Relieving Factors Repositioning   Multiple Pain Sites No                      OT Treatments/Exercises (OP) - 06/06/16 0001      Transfers   Comments Supervision for functional mobility with RW,      ADLs   Overall ADLs Pt reports Mod I basic ADL and 1 handed dressing however, he  remains frustrated with donning socks; "It takes a long time" and pt c/o LLE slips off of R knee. Discussed several other possible ways to accomplish dressing. Pt maintains that he would rather focus on his L arm during therapy sessions.      Exercises   Exercises Elbow;Wrist;Hand     Shoulder Exercises: Seated   Other Seated Exercises Seated at table for gentle P/ROM to digits, wrist and elbow. Focus on finger extension;/flexion; wrist flexion/extension; composite fist w/ wrist extension and finger extension with wrist flexion. Gentle P/ROM L elbow for flexion/extension, pronaiton and supination.     Neurological Re-education Exercises   Other Exercises 1 reviewed HEP and suggested that pt perform some ex's in sitting as well as in supine for L UE/shoulder     Modalities   Modalities Electrical Stimulation     Electrical Stimulation   Electrical Stimulation Location NMES 50 pps, 250 pw, 10 second cycle, intensity 21 to finger and wrist extensors x20 min.   Electrical Stimulation Action Focus on finger and wrist extension. Assisted by therapist when actively working. Pt was with some noted redness after removal of e-stim pads at extensor muscle mass area.    Electrical Stimulation Parameters See  above   Electrical Stimulation Goals Neuromuscular facilitation     Splinting   Splinting Stressed importance of use of splint at noc and if not, attempting to use intermittently during hte day. Pt verbalized understanding but also stated that he perfers to have "my arm and hand free" during day and at noc.                OT Education - 06/06/16 1212    Education provided Yes   Education Details Stressed importance of performance of Home Ex program, resting hand splint for posstioning and assisting with finger extension.   Person(s) Educated Patient;Spouse   Methods Explanation;Demonstration   Comprehension Verbalized understanding          OT Short Term Goals - 05/31/16 1245       OT SHORT TERM GOAL #1   Title Pt will I state precautions/signs and symptoms of CVA   Baseline VC's   Time 4   Period Weeks   Status Achieved     OT SHORT TERM GOAL #2   Title Pt will demonstrate Mod I HEP for LUE self ROM    Baseline VC's and tc's   Time 4   Period Weeks   Status Partially Met  Pt requires v.c./ assist from wife     OT SHORT TERM GOAL #3   Title Pt will I state 2-3 edema control techniques LUE    Baseline VC's/Min A   Status Deferred     OT SHORT TERM GOAL #4   Title Pt will use LUE as a stabilizer 10% of the time for ADLS/ IADLs.   Time 4   Period Weeks   Status New     OT SHORT TERM GOAL #5   Title Pt will attend to left hemibody space with no more than min v.c. 75% of the time.   Time 4   Period Weeks   Status New     OT SHORT TERM GOAL #6   Title Pt will verbalize understanding of proper LUE positioning to minimze risk for injury/ contracture.   Time 4   Period Weeks   Status New     OT SHORT TERM GOAL #7   Title Pt will demonstrate ability to grasp /release a cylindrical cone 2/3 trials with LUe in prep for functional use of LUE.   Time 4   Period Weeks   Status New           OT Long Term Goals - 05/31/16 1246      OT LONG TERM GOAL #1   Title Pt will be Mod I upgrade HEP LUE   Time 8   Period Weeks   Status On-going     OT LONG TERM GOAL #2   Title Pt will be Mod I simple snack prep in ADL kitchen (using manual w/c vs hemi walker).   Time 8   Period Weeks   Status Achieved  achieved at a w/c level per wife     OT LONG TERM GOAL #3   Title Pt will be Mod I basic ADL transfers using hemi walker    Time 8   Period Weeks   Status On-going  needs supervison with walker     OT LONG TERM GOAL #4   Title Pt will be supervision LB dressing w/ a/e or DME PRN, using 1 handed technique   Time 8   Period Weeks   Status Achieved  met, per pt/ wife  OT LONG TERM GOAL #5   Title Pt will be supervision level simulated bathing  UB/LB using 1 handed technique   Time 8   Period Weeks   Status On-going  min A     OT LONG TERM GOAL #6   Title Pt will use LUE as a stabilizer/ gross A 25% of the time for ADLs/ IADLs with pain no greater than 3/10.   Time 8   Period Weeks   Status New     OT LONG TERM GOAL #7   Title Pt will demonstrate 50% elbow flexion and 25 % elbow extension for LUE in prep for functional use.   Time 8   Period Weeks   Status New     OT LONG TERM GOAL #8   Title --               Plan - 06/06/16 1217    Clinical Impression Statement Pt is progressing toward goals. Pt should benefit from performance of HEP in conjunction with out-pt therapy, splinting for positioning and NMES to encourage finger and wrist extension and eventual use of LUE as stabilizer during functional activities.   Rehab Potential Fair   OT Frequency 1x / week   OT Duration 8 weeks   OT Treatment/Interventions Self-care/ADL training;Therapeutic exercise;DME and/or AE instruction;Therapist, nutritional;Therapeutic activities;Patient/family education;Splinting;Neuromuscular education;Electrical Stimulation;Passive range of motion;Therapeutic exercises;Visual/perceptual remediation/compensation   Plan Neuro-re-ed LUE; NMES   Consulted and Agree with Plan of Care Patient   Family Member Consulted Spouse      Patient will benefit from skilled therapeutic intervention in order to improve the following deficits and impairments:  Abnormal gait, Decreased balance, Decreased endurance, Difficulty walking, Impaired sensation, Impaired vision/preception, Pain, Increased edema, Decreased range of motion, Decreased cognition, Decreased activity tolerance, Decreased coordination, Decreased knowledge of use of DME, Decreased safety awareness, Decreased strength, Impaired UE functional use  Visit Diagnosis: Muscle weakness (generalized)  Hemiplegia and hemiparesis following cerebral infarction affecting left dominant side  (HCC)  Other lack of coordination  Other symptoms and signs involving cognitive functions following cerebral infarction    Problem List Patient Active Problem List   Diagnosis Date Noted  . Neuropathic pain of left shoulder 05/19/2016  . Alcohol use disorder, severe, dependence ([place]) 02/15/2016  . Muscle contusion 02/11/2016  . Left flaccid hemiparesis   . Muscle spasticity   . Poor appetite   . Hyperlipidemia 01/14/2016  . Depression 01/14/2016  . Obesity 01/14/2016  . Hyperglycemia 01/14/2016  . Urinary tract infection, site not specified 01/14/2016  . Basal ganglia hemorrhage ([place]) 01/14/2016  . Hemiparesis affecting nondominant side as late effect of cerebrovascular accident ([place])   . Cognitive deficit, post-stroke   . Left hemiparesis ([place])   . Essential hypertension   . Dysphagia, post-stroke   . Gait disturbance, post-stroke   . Hyponatremia   . Thrombocytopenia ([place])   . Hemorrhagic stroke ([place]) R basal ganglia d/t HTN 01/08/2016    Zarriah Starkel Ardath Sax, OTR/L 06/06/2016, 12:22 PM  [facility] [address] [place], [place], [address] Phone: [phone]   Fax:  [phone]  Name: KAYLOB WALLEN MRN: [id number] Date of Birth: 17-Mar-1956

## 2016-06-06 NOTE — Therapy (Signed)
[facility] [address] [place], [place], [address] Phone: [phone]   Fax:  [phone]  Physical Therapy Treatment  Patient Details  Name: Jon [name] MRN: [id number] Date of Birth: 06-15-1956 Referring Provider: Dr. Letta Pate  Encounter Date: 06/06/2016      PT End of Session - 06/06/16 0853    Visit Number 17   Number of Visits 20  requesting 3 additional PT visits   Date for PT Re-Evaluation 07/04/16   Authorization Type UHC and Medicare? G-CODE AND PROGRESS NOTE EVERY 10TH VISIT.    Authorization - Visit Number 17   Authorization - Number of Visits 20   PT Start Time [phone]   PT Stop Time 0930   PT Time Calculation (min) 43 min   Equipment Utilized During Treatment Gait belt   Activity Tolerance Patient tolerated treatment well   Behavior During Therapy WFL for tasks assessed/performed      Past Medical History:  Diagnosis Date  . Hypertension   . Liver damage   . Stroke [facility])     Past Surgical History:  Procedure Laterality Date  . TONSILLECTOMY AND ADENOIDECTOMY Bilateral    Age 60    There were no vitals filed for this visit.          [facility] PT Assessment - 06/06/16 0001      Berg Balance Test   Sit to Stand Able to stand  independently using hands   Standing Unsupported Able to stand safely 2 minutes   Sitting with Back Unsupported but Feet Supported on Floor or Stool Able to sit 2 minutes under supervision   Stand to Sit Sits safely with minimal use of hands   Transfers Able to transfer with verbal cueing and /or supervision   Standing Unsupported with Eyes Closed Able to stand 10 seconds with supervision   Standing Ubsupported with Feet Together Able to place feet together independently and stand 1 minute safely   From Standing, Reach Forward with Outstretched Arm Can reach forward >12 cm safely (5")  8"   From Standing Position, Pick up Object from Floor Unable to try/needs  assist to keep balance   From Standing Position, Turn to Look Behind Over each Shoulder Looks behind one side only/other side shows less weight shift  decreased weight shift to L side   Turn 360 Degrees Needs close supervision or verbal cueing   Standing Unsupported, Alternately Place Feet on Step/Stool Able to complete >2 steps/needs minimal assist   Standing Unsupported, One Foot in Front Able to plae foot ahead of the other independently and hold 30 seconds   Standing on One Leg Tries to lift leg/unable to hold 3 seconds but remains standing independently   Total Score 35                     [facility] Adult PT Treatment/Exercise - 06/06/16 1802      Transfers   Transfers Sit to Stand;Stand to Sit   Sit to Stand 5: Supervision   Sit to Stand Details (indicate cue type and reason) Cueing for symmetrical LE placement/WB with inconsistent within-session carryover.   Stand to Sit 5: Supervision   Stand to Sit Details Cueing for symmetrical LE placement/WB, to remove L hand from L hand orthosis prior to sitting with inconsistent within-session carryover.     Ambulation/Gait   Ambulation/Gait Yes   Ambulation/Gait Assistance 6: Modified independent (Device/Increase time)   Ambulation Distance (Feet) 75 Feet  Assistive device Rolling walker  L AFO, L hand orthosis   Gait Pattern Step-through pattern;Decreased hip/knee flexion - left;Decreased stance time - left;Left flexed knee in stance;Lateral trunk lean to right;Decreased weight shift to left;Decreased step length - right;Trunk rotated posteriorly on left   Ambulation Surface Level;Indoor   Gait velocity 0.6 ft/sec                PT Education - 06/06/16 1801    Education provided Yes   Education Details LTG findings, progress, and POC. Explained that current POC will end after 3 remaining PT visits; therefore, will focus final 3 visits on establishing HEP.   Person(s) Educated Patient;Spouse   Methods Explanation    Comprehension Verbalized understanding          PT Short Term Goals - 05/06/16 1331      PT SHORT TERM GOAL #1   Title Pt will verbalize understanding of risk factors and signs/sx's of CVA in order to reduce risk of addtional CVA. TARGET DATE FOR ALL STGS: 05/03/16   Status Partially Met     PT SHORT TERM GOAL #2   Title Perform BERG and write goal prn.    Baseline 9/13: Berg score = 9/56   Status Achieved     PT SHORT TERM GOAL #3   Title Pt will amb. 200' with LRAD and S over even terrain to improve functional mobility.    Baseline Met 10/13.   Status Achieved     PT SHORT TERM GOAL #4   Title Pt will improve gait speed with LRAD to >/=0.22f/sec. to improve functional mobility.    Baseline 10/13: gait velocity with RW and L hand orthosis = .38 ft/sec   Status Not Met     PT SHORT TERM GOAL #5   Title Pt will improve Berg score from 9/56 to 13/56 to indicate improved standing balance.    Status Achieved           PT Long Term Goals - 06/06/16 0857      PT LONG TERM GOAL #1   Title Pt will be IND in HEP to improve flexibility, strength, and balance. TARGET DATE FOR ALL LTGS: 05/31/16   Baseline 11/13: wife reports pt performs HEP inconsistently.   Status Partially Met     PT LONG TERM GOAL #2   Title Pt will improve gait speed to >/=1.882fsec with LRAD to decr. falls risk.    Baseline 11/13: gait velocity = 0.6 ft/sec with RW, L hand orthosis   Status Not Met     PT LONG TERM GOAL #3   Title Pt will amb. 500' over even and paved surfaces at with LRAD at MOD I level to improve functional mobility.    Baseline Required supervision for community gait x500' with RW on 11/6, per documentation.   Status Partially Met     PT LONG TERM GOAL #4   Title Pt will report zero falls over the last 4 weeks to improve safety during functional mobility.    Baseline Met 11/13.   Status Achieved     PT LONG TERM GOAL #5   Title Pt will improve Berg score from 29/56 to 34/56 to  indicate improved standing balance.    Baseline 11/13: Berg = 35/56   Status Achieved      Updated goals:     PT Short Term Goals - 06/06/16 1814      PT SHORT TERM GOAL #1   Title STG's =  LTG's   Status Partially Met     PT SHORT TERM GOAL #2   Title Perform BERG and write goal prn.    Baseline 9/13: Berg score = 9/56   Status Achieved     PT SHORT TERM GOAL #3   Title Pt will amb. 200' with LRAD and S over even terrain to improve functional mobility.    Baseline Met 10/13.   Status Achieved     PT SHORT TERM GOAL #4   Title Pt will improve gait speed with LRAD to >/=0.68f/sec. to improve functional mobility.    Baseline 10/13: gait velocity with RW and L hand orthosis = .38 ft/sec   Status Not Met     PT SHORT TERM GOAL #5   Title Pt will improve Berg score from 9/56 to 13/56 to indicate improved standing balance.    Status Achieved         PT Long Term Goals - 06/06/16 1814      PT LONG TERM GOAL #1   Title Pt will consistently perform functional transfers with mod I and symmetrical LE placement/WB to indicate attention to/use of L side. (Target:  07/01/16)   Status New     PT LONG TERM GOAL #2   Title Pt will perform HEP with assistance of wife to maximize functional gains made in PT.  (07/01/16)   Status New     PT LONG TERM GOAL #3   Title Pt will negotiate standard curb step with LRAD and CGA of wife to increase pt access to community.  (07/01/16)   Status New              Plan - 06/06/16 1806    Clinical Impression Statement Pt has met or partially met 4 of 5 LTG's, suggesting improved standing balance and decreased frequency of falls at home. Goal for gait velocity not met. LTG's for HEP compliance and community mobility partially met. Pt and wife strongly desire to utilize remaining visits covered by insurance during this calendar year; and pt has demonstrated measureable progress, per Berg score. Therefore, pt will benefit from skilled outpatient  PT for 3 additional sessions over a 4-week period to establish a long-term HEP and to increase pt attention to LUE/LLE during functional mobility.   Rehab Potential Good   Clinical Impairments Affecting Rehab Potential co-morbidities   PT Frequency Other (comment)  3 sessions total   PT Duration 4 weeks   PT Treatment/Interventions ADLs/Self Care Home Management;Biofeedback;Canalith Repostioning;Electrical Stimulation;Cognitive remediation;Neuromuscular re-education;Balance training;Therapeutic exercise;Therapeutic activities;Functional mobility training;Stair training;Gait training;DME Instruction;Orthotic Fit/Training;Patient/family education;Wheelchair mobility training;Manual techniques;Vestibular   PT Next Visit Plan Establish HEP (? corner balance) pt/wife feel comfortable with performing daily beyond DC from PT. Reiterate importance of LUE/LLE attention, functional use during mobility.   Consulted and Agree with Plan of Care Patient;Family member/caregiver   Family Member Consulted wife-[name]      Patient will benefit from skilled therapeutic intervention in order to improve the following deficits and impairments:  Abnormal gait, Decreased endurance, Impaired sensation, Decreased knowledge of use of DME, Decreased strength, Impaired UE functional use, Decreased mobility, Decreased balance, Decreased range of motion, Decreased cognition, Impaired flexibility, Postural dysfunction, Impaired tone  Visit Diagnosis: Hemiplegia and hemiparesis following cerebral infarction affecting left dominant side (HCC)  Other lack of coordination  Muscle weakness (generalized)     Problem List Patient Active Problem List   Diagnosis Date Noted  . Neuropathic pain of left shoulder 05/19/2016  . Alcohol use disorder, severe,  dependence ([place]) 02/15/2016  . Muscle contusion 02/11/2016  . Left flaccid hemiparesis   . Muscle spasticity   . Poor appetite   . Hyperlipidemia 01/14/2016  . Depression  01/14/2016  . Obesity 01/14/2016  . Hyperglycemia 01/14/2016  . Urinary tract infection, site not specified 01/14/2016  . Basal ganglia hemorrhage ([place]) 01/14/2016  . Hemiparesis affecting nondominant side as late effect of cerebrovascular accident ([place])   . Cognitive deficit, post-stroke   . Left hemiparesis ([place])   . Essential hypertension   . Dysphagia, post-stroke   . Gait disturbance, post-stroke   . Hyponatremia   . Thrombocytopenia ([place])   . Hemorrhagic stroke ([place]) R basal ganglia d/t HTN 01/08/2016    Billie Ruddy, PT, DPT [facility] [address] [place], [place], [address] Phone: [phone]   Fax:  [phone] 06/06/16, 6:14 PM  Name: Jon [name] MRN: [id number] Date of Birth: 1955/09/21

## 2016-06-06 NOTE — Therapy (Signed)
[facility] [address] [place], [place], [address] Phone: [phone]   Fax:  [phone]  Speech Language Pathology Treatment  Patient Details  Name: Jon [name] MRN: [id number] Date of Birth: [birth year] Referring Provider: Dr. Claudette LawsAndrew Kirsteins   Encounter Date: 06/06/2016      End of Session - 06/06/16 1032    Visit Number 10   Number of Visits 17   Date for SLP Re-Evaluation 05/31/16   Authorization Type 20 ST    SLP Start Time 0935   SLP Stop Time  1015   SLP Time Calculation (min) 40 min   Activity Tolerance Patient tolerated treatment well      Past Medical History:  Diagnosis Date  . Hypertension   . Liver damage   . Stroke [facility](HCC)     Past Surgical History:  Procedure Laterality Date  . TONSILLECTOMY AND ADENOIDECTOMY Bilateral    Age 60    There were no vitals filed for this visit.      Subjective Assessment - 06/06/16 0953    Subjective "I'm doing okay."   Patient is accompained by: --  spouse               ADULT SLP TREATMENT - 06/06/16 1014      General Information   Behavior/Cognition Alert;Cooperative;Pleasant mood   Patient Positioning Upright in chair     Treatment Provided   Treatment provided Cognitive-Linquistic     Cognitive-Linquistic Treatment   Treatment focused on Cognition   Skilled Treatment Pt seen for skilled ST session focusing on attention, organization, reasoning, problem solving.  Pt initially quite distracted, but with phased activities, requiring greater attention to detail with each step, pt was ultimately able to demonstrate alternating attention at mod I. Mental fatigue noted and then even sustained attention became a challenge.      Assessment / Recommendations / Plan   Plan Continue with current plan of care     Progression Toward Goals   Progression toward goals Progressing toward goals            SLP Short Term Goals - 06/06/16  1036      SLP SHORT TERM GOAL #1   Title Pt will selectively attend to details on mildly complex cognitive linguistic task with distractions and 85% accuracy.   Status On-going     SLP SHORT TERM GOAL #2   Title Pt will perform mildy complex organization, reasoning and time/money problems with 85% accuracy and rare min A   Status On-going     SLP SHORT TERM GOAL #3   Title Pt  will utilize external memory aids/book to recall tasks, errands, lists, and reduce repetitive question asking/frustration over 3 sessions with occasional min A.   Time 1   Period Weeks   Status On-going     SLP SHORT TERM GOAL #4   Title Pt will face and maintain eye contact conversation partner on his left side with rare min A for 5 minutes conversation.    Status On-going     SLP SHORT TERM GOAL #5   Title pt will complete HEP with occasional min A (for dysarthria)   Status Achieved          SLP Long Term Goals - 06/06/16 1037      SLP LONG TERM GOAL #1   Title Pt will alterate attention between 2 mildly complex cognitive linguistic tasks with 85% on each and occasional min A   Time  5   Period Weeks   Status On-going     SLP LONG TERM GOAL #2   Title Pt will solve moderately complex reasoning, functional math and organization problems with 90% accuracy and occasional min A   Time 5   Period Weeks   Status On-going     SLP LONG TERM GOAL #3   Title Pt will utilize external aids for medication management with rare min A over 3 sessions.   Time 5   Period Weeks   Status On-going     SLP LONG TERM GOAL #4   Title pt will complete HEP for dysarthria with rare min A over two sessions   Time 5   Period Weeks   Status On-going          Plan - 06/06/16 1033    Clinical Impression Statement Pt reports he will not complete homework, but was able to recall activities planned for this session. Variable performance continued with periods of improved attention and then periods requiring mod A to  gain and maintain attention to task.   Speech Therapy Frequency 2x / week   Duration 4 weeks   Treatment/Interventions Compensatory strategies;Functional tasks;Patient/family education;Cueing hierarchy;Cognitive reorganization;Internal/external aids;SLP instruction and feedback   Potential to Achieve Goals Good   Potential Considerations Severity of impairments      Patient will benefit from skilled therapeutic intervention in order to improve the following deficits and impairments:   Cognitive communication deficit    Problem List Patient Active Problem List   Diagnosis Date Noted  . Neuropathic pain of left shoulder 05/19/2016  . Alcohol use disorder, severe, dependence (HCC) 02/15/2016  . Muscle contusion 02/11/2016  . Left flaccid hemiparesis   . Muscle spasticity   . Poor appetite   . Hyperlipidemia 01/14/2016  . Depression 01/14/2016  . Obesity 01/14/2016  . Hyperglycemia 01/14/2016  . Urinary tract infection, site not specified 01/14/2016  . Basal ganglia hemorrhage (HCC) 01/14/2016  . Hemiparesis affecting nondominant side as late effect of cerebrovascular accident (HCC)   . Cognitive deficit, post-stroke   . Left hemiparesis (HCC)   . Essential hypertension   . Dysphagia, post-stroke   . Gait disturbance, post-stroke   . Hyponatremia   . Thrombocytopenia (HCC)   . Hemorrhagic stroke (HCC) R basal ganglia d/t HTN 01/08/2016    Rocky CraftsKara E Bhavana Kady MA, CCC-SLP 06/06/2016, 10:38 AM  [facility] [address] [place], [place], [address] Phone: [phone]   Fax:  [phone]   Name: Jon [name] MRN: [id number] Date of Birth: [birth year]

## 2016-06-10 ENCOUNTER — Ambulatory Visit: Payer: BLUE CROSS/BLUE SHIELD | Admitting: Occupational Therapy

## 2016-06-10 ENCOUNTER — Ambulatory Visit: Payer: 59 | Admitting: Speech Pathology

## 2016-06-10 ENCOUNTER — Ambulatory Visit: Payer: Self-pay | Admitting: Rehabilitation

## 2016-06-10 DIAGNOSIS — R41841 Cognitive communication deficit: Secondary | ICD-10-CM | POA: Diagnosis not present

## 2016-06-10 NOTE — Therapy (Signed)
[facility] [address] [place], [place], [address] Phone: [phone]   Fax:  [phone]  Speech Language Pathology Treatment  Patient Details  Name: Jon [name] MRN: [id number] Date of Birth: 1955/09/16 Referring Provider: Dr. Alysia Penna   Encounter Date: 06/10/2016      End of Session - 06/10/16 1334    Visit Number 11   Number of Visits 17   Date for SLP Re-Evaluation 06/30/16   Authorization Type 20 ST    Authorization - Visit Number 11   Authorization - Number of Visits 20   SLP Start Time 9518   SLP Stop Time  8416   SLP Time Calculation (min) 43 min      Past Medical History:  Diagnosis Date  . Hypertension   . Liver damage   . Stroke [facility])     Past Surgical History:  Procedure Laterality Date  . TONSILLECTOMY AND ADENOIDECTOMY Bilateral    Age 60    There were no vitals filed for this visit.      Subjective Assessment - 06/10/16 1243    Subjective "I don't want any homework"   Special Tests [name]               ADULT SLP TREATMENT - 06/10/16 1246      General Information   Behavior/Cognition Alert;Cooperative;Pleasant mood     Treatment Provided   Treatment provided Cognitive-Linquistic     Cognitive-Linquistic Treatment   Treatment focused on Cognition   Skilled Treatment Mildly complex reasoning, organizing and attention to detail problem solving with occasional min to mod A, primarily for organization of multip step problems.  Pt required extended processing time for alternating attention in moderately complex card sort task. Pt required cues and rationale for  left attention to communication partner.      Assessment / Recommendations / Plan   Plan Continue with current plan of care     Progression Toward Goals   Progression toward goals Progressing toward goals          SLP Education - 06/10/16 1331    Education provided Yes   Education Details Need  to use body posture - shoulders, head, neck pointed to communication partner, especially on the left side   Person(s) Educated Patient   Methods Explanation;Demonstration;Verbal cues   Comprehension Returned demonstration;Verbal cues required          SLP Short Term Goals - 06/10/16 1334      SLP SHORT TERM GOAL #1   Title Pt will selectively attend to details on mildly complex cognitive linguistic task with distractions and 85% accuracy.   Status Achieved     SLP SHORT TERM GOAL #2   Title Pt will perform mildy complex organization, reasoning and time/money problems with 85% accuracy and rare min A   Status Not Met     SLP SHORT TERM GOAL #3   Title Pt  will utilize external memory aids/book to recall tasks, errands, lists, and reduce repetitive question asking/frustration over 3 sessions with occasional min A.   Time 1   Period Weeks   Status On-going     SLP SHORT TERM GOAL #4   Title Pt will face and maintain eye contact conversation partner on his left side with rare min A for 5 minutes conversation.    Status Partially Met     SLP SHORT TERM GOAL #5   Title pt will complete HEP with occasional min A (for dysarthria)  Status Achieved          SLP Long Term Goals - 06/10/16 1334      SLP LONG TERM GOAL #1   Title Pt will alterate attention between 2 mildly complex cognitive linguistic tasks with 85% on each and occasional min A   Time 5   Period Weeks   Status On-going     SLP LONG TERM GOAL #2   Title Pt will solve moderately complex reasoning, functional math and organization problems with 90% accuracy and occasional min A   Time 5   Period Weeks   Status On-going     SLP LONG TERM GOAL #3   Title Pt will utilize external aids for medication management with rare min A over 3 sessions.   Time 5   Period Weeks   Status On-going     SLP LONG TERM GOAL #4   Title pt will complete HEP for dysarthria with rare min A over two sessions   Time 5   Period  Weeks   Status On-going          Plan - 06/10/16 1333    Clinical Impression Statement Pt reports he will not complete homework.  Variable performance continued with periods of improved attention and then periods requiring mod A to gain and maintain attention to task. Continue skilled ST to maximize cognition to reduce caregiver burden   Speech Therapy Frequency 2x / week   Treatment/Interventions Compensatory strategies;Functional tasks;Patient/family education;Cueing hierarchy;Cognitive reorganization;Internal/external aids;SLP instruction and feedback   Potential to Achieve Goals Good   Potential Considerations Severity of impairments      Patient will benefit from skilled therapeutic intervention in order to improve the following deficits and impairments:   Cognitive communication deficit    Problem List Patient Active Problem List   Diagnosis Date Noted  . Neuropathic pain of left shoulder 05/19/2016  . Alcohol use disorder, severe, dependence ([place]) 02/15/2016  . Muscle contusion 02/11/2016  . Left flaccid hemiparesis   . Muscle spasticity   . Poor appetite   . Hyperlipidemia 01/14/2016  . Depression 01/14/2016  . Obesity 01/14/2016  . Hyperglycemia 01/14/2016  . Urinary tract infection, site not specified 01/14/2016  . Basal ganglia hemorrhage ([place]) 01/14/2016  . Hemiparesis affecting nondominant side as late effect of cerebrovascular accident ([place])   . Cognitive deficit, post-stroke   . Left hemiparesis ([place])   . Essential hypertension   . Dysphagia, post-stroke   . Gait disturbance, post-stroke   . Hyponatremia   . Thrombocytopenia ([place])   . Hemorrhagic stroke ([place]) R basal ganglia d/t HTN 01/08/2016    Lovvorn, Annye Rusk MS, CCC-SLP 06/10/2016, 1:36 PM  [facility] [address] [place], [place], [address] Phone: [phone]   Fax:  [phone]   Name: Jon [name] MRN: [id number] Date of  Birth: 08/20/1955

## 2016-06-14 ENCOUNTER — Ambulatory Visit: Payer: 59 | Admitting: *Deleted

## 2016-06-14 ENCOUNTER — Ambulatory Visit: Payer: 59

## 2016-06-14 ENCOUNTER — Encounter: Payer: Self-pay | Admitting: *Deleted

## 2016-06-14 DIAGNOSIS — R41841 Cognitive communication deficit: Secondary | ICD-10-CM | POA: Diagnosis not present

## 2016-06-14 DIAGNOSIS — I69352 Hemiplegia and hemiparesis following cerebral infarction affecting left dominant side: Secondary | ICD-10-CM

## 2016-06-14 DIAGNOSIS — R278 Other lack of coordination: Secondary | ICD-10-CM

## 2016-06-14 DIAGNOSIS — M6281 Muscle weakness (generalized): Secondary | ICD-10-CM

## 2016-06-14 DIAGNOSIS — I69318 Other symptoms and signs involving cognitive functions following cerebral infarction: Secondary | ICD-10-CM

## 2016-06-14 NOTE — Therapy (Signed)
[facility] [address] [place], [place], [address] Phone: [phone]   Fax:  [phone]  Occupational Therapy Treatment  Patient Details  Name: Jon [name] MRN: [id number] Date of Birth: 22-Apr-1956 Referring Provider: Dr Alysia Penna  Encounter Date: 06/14/2016      OT End of Session - 06/14/16 1200    Visit Number 16   Number of Visits 22   Date for OT Re-Evaluation 07/31/16   Authorization Type [employer] [facility])    Authorization Time Period renewal completed 05/31/16   Authorization - Visit Number 16   Authorization - Number of Visits 20   OT Start Time 4081   OT Stop Time 1136   OT Time Calculation (min) 49 min   Activity Tolerance Patient tolerated treatment well   Behavior During Therapy [facility] for tasks assessed/performed      Past Medical History:  Diagnosis Date  . Hypertension   . Liver damage   . Stroke [facility])     Past Surgical History:  Procedure Laterality Date  . TONSILLECTOMY AND ADENOIDECTOMY Bilateral    Age 60    There were no vitals filed for this visit.      Subjective Assessment - 06/14/16 1143    Subjective  Pt reports that his LUE/hand is stiff.   Patient is accompained by: Family member   Patient Stated Goals Get my left arm going again and have more control of my left foot.   Currently in Pain? No/denies   Pain Score 0-No pain                      OT Treatments/Exercises (OP) - 06/14/16 0001      ADLs   ADL Comments Discussed ADL's and pt spouse reports that he was able to "get dressed today by himself in about 15 minutes" Pt continues to report that donning socks are more difficult than other activities, but he is overall Mod I with increaesd time using 1 handed dressing techniques.     Neurological Re-education Exercises   Other Exercises 1 reviewed & performed HEP for LUE in sitting and suggested that pt perform some ex's in sitting as well  as in supine for L UE/shoulder (shoulder flexion/extension, ABD/ADD, IR/ER, pronation/supination, elbow flexion/extension)   Other Exercises 2 Gentle PROM to hands, wrist, forearm, digits LUE.     Modalities   Modalities Electrical Stimulation     Electrical Stimulation   Electrical Stimulation Location NMES 50 pps, 250 pw, 10 second cycle, intensity 21 to finger and wrist extensors x20 min.   Electrical Stimulation Action Focus on finger and wrist extension. Assisted by therapist when actively stimulated.    Electrical Stimulation Parameters See above   Electrical Stimulation Goals Neuromuscular facilitation     Splinting   Splinting Stressed importance of use of splint at noc and if not, attempting to use intermittently during the day. Pt verbalized understanding but also stated that he perfers to have "my arm and hand free" during day and at noc.                OT Education - 06/14/16 1158    Education provided Yes   Education Details Stressed performance of Home Program and splinting LUE as able for increased flexibility LUE to assist with ADL's and use as gross assist/stabilizer.   Person(s) Educated Patient   Methods Explanation;Demonstration;Verbal cues   Comprehension Verbalized understanding;Verbal cues required  OT Short Term Goals - 05/31/16 1245      OT SHORT TERM GOAL #1   Title Pt will I state precautions/signs and symptoms of CVA   Baseline VC's   Time 4   Period Weeks   Status Achieved     OT SHORT TERM GOAL #2   Title Pt will demonstrate Mod I HEP for LUE self ROM    Baseline VC's and tc's   Time 4   Period Weeks   Status Partially Met  Pt requires v.c./ assist from wife     OT SHORT TERM GOAL #3   Title Pt will I state 2-3 edema control techniques LUE    Baseline VC's/Min A   Status Deferred     OT SHORT TERM GOAL #4   Title Pt will use LUE as a stabilizer 10% of the time for ADLS/ IADLs.   Time 4   Period Weeks   Status New      OT SHORT TERM GOAL #5   Title Pt will attend to left hemibody space with no more than min v.c. 75% of the time.   Time 4   Period Weeks   Status New     OT SHORT TERM GOAL #6   Title Pt will verbalize understanding of proper LUE positioning to minimze risk for injury/ contracture.   Time 4   Period Weeks   Status New     OT SHORT TERM GOAL #7   Title Pt will demonstrate ability to grasp /release a cylindrical cone 2/3 trials with LUe in prep for functional use of LUE.   Time 4   Period Weeks   Status New           OT Long Term Goals - 05/31/16 1246      OT LONG TERM GOAL #1   Title Pt will be Mod I upgrade HEP LUE   Time 8   Period Weeks   Status On-going     OT LONG TERM GOAL #2   Title Pt will be Mod I simple snack prep in ADL kitchen (using manual w/c vs hemi walker).   Time 8   Period Weeks   Status Achieved  achieved at a w/c level per wife     OT LONG TERM GOAL #3   Title Pt will be Mod I basic ADL transfers using hemi walker    Time 8   Period Weeks   Status On-going  needs supervison with walker     OT LONG TERM GOAL #4   Title Pt will be supervision LB dressing w/ a/e or DME PRN, using 1 handed technique   Time 8   Period Weeks   Status Achieved  met, per pt/ wife     OT LONG TERM GOAL #5   Title Pt will be supervision level simulated bathing UB/LB using 1 handed technique   Time 8   Period Weeks   Status On-going  min A     OT LONG TERM GOAL #6   Title Pt will use LUE as a stabilizer/ gross A 25% of the time for ADLs/ IADLs with pain no greater than 3/10.   Time 8   Period Weeks   Status New     OT LONG TERM GOAL #7   Title Pt will demonstrate 50% elbow flexion and 25 % elbow extension for LUE in prep for functional use.   Time 8   Period Weeks   Status New  OT LONG TERM GOAL #8   Title --               Plan - 06/14/16 1201    Clinical Impression Statement Pt reports that he is mod I related to basic ADL's and  dressing given increased time for tasks. Pt should benefit from perfomance of HEP, splinting for positioning in conjunction with out pt therapy.   Rehab Potential Fair   OT Frequency 1x / week   OT Duration 8 weeks   OT Treatment/Interventions Self-care/ADL training;Therapeutic exercise;DME and/or AE instruction;Therapist, nutritional;Therapeutic activities;Patient/family education;Splinting;Neuromuscular education;Electrical Stimulation;Passive range of motion;Therapeutic exercises;Visual/perceptual remediation/compensation   Plan Begin checking LTG's; Neuro-re-ed, NMES LUE.   Consulted and Agree with Plan of Care Patient   Family Member Consulted Spouse      Patient will benefit from skilled therapeutic intervention in order to improve the following deficits and impairments:  Abnormal gait, Decreased balance, Decreased endurance, Difficulty walking, Impaired sensation, Impaired vision/preception, Pain, Increased edema, Decreased range of motion, Decreased cognition, Decreased activity tolerance, Decreased coordination, Decreased knowledge of use of DME, Decreased safety awareness, Decreased strength, Impaired UE functional use  Visit Diagnosis: Hemiplegia and hemiparesis following cerebral infarction affecting left dominant side (HCC)  Other lack of coordination  Muscle weakness (generalized)  Other symptoms and signs involving cognitive functions following cerebral infarction    Problem List Patient Active Problem List   Diagnosis Date Noted  . Neuropathic pain of left shoulder 05/19/2016  . Alcohol use disorder, severe, dependence ([place]) 02/15/2016  . Muscle contusion 02/11/2016  . Left flaccid hemiparesis   . Muscle spasticity   . Poor appetite   . Hyperlipidemia 01/14/2016  . Depression 01/14/2016  . Obesity 01/14/2016  . Hyperglycemia 01/14/2016  . Urinary tract infection, site not specified 01/14/2016  . Basal ganglia hemorrhage ([place]) 01/14/2016  . Hemiparesis  affecting nondominant side as late effect of cerebrovascular accident ([place])   . Cognitive deficit, post-stroke   . Left hemiparesis ([place])   . Essential hypertension   . Dysphagia, post-stroke   . Gait disturbance, post-stroke   . Hyponatremia   . Thrombocytopenia ([place])   . Hemorrhagic stroke ([place]) R basal ganglia d/t HTN 01/08/2016    Isadore Bokhari Ardath Sax, OTR/L 06/14/2016, 12:06 PM  [facility] [address] [place], [place], [address] Phone: [phone]   Fax:  [phone]  Name: DAMETRIUS SANJUAN MRN: [id number] Date of Birth: [birth year]

## 2016-06-14 NOTE — Patient Instructions (Signed)
Bracing With Bridging (Hook-Lying)    Lie on your back with both knees bent, feet shoulder width apart. Loop a belt around the knees to keep left leg from falling out. Tuck in stomach, then Push through both legs to lift hips from bed. Hold for 1-2 seconds, then slowly lower to resting position. Repeat _15__ times. Do _2-3__ times a day.  Supine Marching    While lying on your back with both knees bent (as pictured), slowly raise/lower right then left leg from bed. Focus on controlling LEFT leg (don't let it fall out to the side). Perform 8-10 consecutive repetitions, focusing on quality of movement. Repeat __2-3__ times per day. STOP THIS ONE AND PERFORM SEATED, LIKE THE EXERCISE LISTED BELOW.  HIP / KNEE: Flexion - Sitting    Sit in chair with back supported. Tuck in stomach. Raise knee to chest. Keep back straight, knee bent. Lower, and repeat with other leg. Then relax stomach. _5__ reps per set, _2__ sets per session, _3-4__ days per week  Copyright  VHI. All rights reserved.     Feet Apart, Head Motion - Eyes Open    Perform at kitchen sink with chair behind you for safety.  With eyes open, feet apart, move head slowly: up and down 5 times; right to left 5 times. Line up so that the middle of your body lines up with the middle of the sink, equal weight through both legs. Repeat __2-3__ times per day.  Copyright  VHI. All rights reserved.

## 2016-06-14 NOTE — Therapy (Signed)
[facility] [address] [place], [place], [address] Phone: [phone]   Fax:  [phone]  Physical Therapy Treatment  Patient Details  Name: Jon [name] MRN: [id number] Date of Birth: 28-Oct-1955 Referring Provider: Alysia Penna, MD  Encounter Date: 06/14/2016      PT End of Session - 06/14/16 1300    Visit Number 18   Number of Visits 20   Date for PT Re-Evaluation 07/04/16   Authorization Type UHC and Medicare? G-CODE AND PROGRESS NOTE EVERY 10TH VISIT.    Authorization - Visit Number 18   Authorization - Number of Visits 20   PT Start Time 1150   PT Stop Time 1232   PT Time Calculation (min) 42 min   Equipment Utilized During Treatment Gait belt   Activity Tolerance Patient tolerated treatment well   Behavior During Therapy WFL for tasks assessed/performed      Past Medical History:  Diagnosis Date  . Hypertension   . Liver damage   . Stroke [facility])     Past Surgical History:  Procedure Laterality Date  . TONSILLECTOMY AND ADENOIDECTOMY Bilateral    Age 60    There were no vitals filed for this visit.      Subjective Assessment - 06/14/16 1155    Subjective Pt feels he's more confident with his balance and standing/walking. Pt denied falls since last visit.    Patient is accompained by: Family member   Pertinent History Pt goes by [name].   PMH significant for: HTN, ETOH abuse per pt's wife but pt has not consumed alcohol since ICH, liver damage   Patient Stated Goals Control ankle movement better and improve placement of the foot.    Currently in Pain? No/denies         Therex and neuro re-ed: Pt performed strengthening HEP and modified HEP with cues and demo for technique to reduce LBP. Please see pt instructions for details. Pt performed balance HEP in corner with RW in front of pt and min guard to S for safety. Please see pt instructions for details. Pt performed standing with  feet apart, head turns/nods, and eyes open/closed.     Gait: Pt amb. With RW and L h/o with cues to improve LLE stance time and stride length. 50'x2, 30'x2. Performed with min guard to S to ensure safety.                        PT Education - 06/14/16 1256    Education provided Yes   Education Details Reviewed HEP and modified HEP to reduce LBP. PT also progressed pt to standing balance HEP.   Person(s) Educated Patient;Spouse   Methods Explanation;Demonstration;Tactile cues;Verbal cues;Handout   Comprehension Returned demonstration;Verbalized understanding          PT Short Term Goals - 06/06/16 1814      PT SHORT TERM GOAL #1   Title STG's = LTG's   Status Partially Met     PT SHORT TERM GOAL #2   Title Perform BERG and write goal prn.    Baseline 9/13: Berg score = 9/56   Status Achieved     PT SHORT TERM GOAL #3   Title Pt will amb. 200' with LRAD and S over even terrain to improve functional mobility.    Baseline Met 10/13.   Status Achieved     PT SHORT TERM GOAL #4   Title Pt will improve gait speed with LRAD  to >/=0.38f/sec. to improve functional mobility.    Baseline 10/13: gait velocity with RW and L hand orthosis = .38 ft/sec   Status Not Met     PT SHORT TERM GOAL #5   Title Pt will improve Berg score from 9/56 to 13/56 to indicate improved standing balance.    Status Achieved           PT Long Term Goals - 06/06/16 1814      PT LONG TERM GOAL #1   Title Pt will consistently perform functional transfers with mod I and symmetrical LE placement/WB to indicate attention to/use of L side. (Target:  07/01/16)   Status New     PT LONG TERM GOAL #2   Title Pt will perform HEP with assistance of wife to maximize functional gains made in PT.  (07/01/16)   Status New     PT LONG TERM GOAL #3   Title Pt will negotiate standard curb step with LRAD and CGA of wife to increase pt access to community.  (07/01/16)   Status New                Plan - 06/14/16 1301    Clinical Impression Statement Pt demonstrated progress, as he was able to perform standing balance HEP without assist and progressed to adding to HEP. Pt reported no LBP while performing modified strengthening HEP with abdominal muscles activated. Continue with POC.    Rehab Potential Good   Clinical Impairments Affecting Rehab Potential co-morbidities   PT Frequency Other (comment)  3 sessions total   PT Duration 4 weeks   PT Treatment/Interventions ADLs/Self Care Home Management;Biofeedback;Canalith Repostioning;Electrical Stimulation;Cognitive remediation;Neuromuscular re-education;Balance training;Therapeutic exercise;Therapeutic activities;Functional mobility training;Stair training;Gait training;DME Instruction;Orthotic Fit/Training;Patient/family education;Wheelchair mobility training;Manual techniques;Vestibular   PT Next Visit Plan Gait training, add to HEP as tolerated. Reiterate importance of LUE/LLE attention, functional use during mobility.   Consulted and Agree with Plan of Care Patient;Family member/caregiver   Family Member Consulted wife-[name]      Patient will benefit from skilled therapeutic intervention in order to improve the following deficits and impairments:  Abnormal gait, Decreased endurance, Impaired sensation, Decreased knowledge of use of DME, Decreased strength, Impaired UE functional use, Decreased mobility, Decreased balance, Decreased range of motion, Decreased cognition, Impaired flexibility, Postural dysfunction, Impaired tone  Visit Diagnosis: Muscle weakness (generalized)  Hemiplegia and hemiparesis following cerebral infarction affecting left dominant side ([facility]     Problem List Patient Active Problem List   Diagnosis Date Noted  . Neuropathic pain of left shoulder 05/19/2016  . Alcohol use disorder, severe, dependence ([place] 02/15/2016  . Muscle contusion 02/11/2016  . Left flaccid hemiparesis   .  Muscle spasticity   . Poor appetite   . Hyperlipidemia 01/14/2016  . Depression 01/14/2016  . Obesity 01/14/2016  . Hyperglycemia 01/14/2016  . Urinary tract infection, site not specified 01/14/2016  . Basal ganglia hemorrhage ([place] 01/14/2016  . Hemiparesis affecting nondominant side as late effect of cerebrovascular accident ([place]   . Cognitive deficit, post-stroke   . Left hemiparesis ([place]   . Essential hypertension   . Dysphagia, post-stroke   . Gait disturbance, post-stroke   . Hyponatremia   . Thrombocytopenia ([place]   . Hemorrhagic stroke ([place] R basal ganglia d/t HTN 01/08/2016    [name],Jennifer L 06/14/2016, 1:03 PM  [facility] [address]Phone: [phone]  Fax:  [phone] Name: GAUBRY TUCHOLSKIMRN: [id number] of  Birth: Dec 16, 1955  Geoffry Paradise, PT,DPT 06/14/16 1:06 PM Phone: [phone] Fax: [phone]

## 2016-06-21 ENCOUNTER — Ambulatory Visit: Payer: 59

## 2016-06-21 ENCOUNTER — Ambulatory Visit: Payer: 59 | Admitting: Occupational Therapy

## 2016-06-21 ENCOUNTER — Ambulatory Visit: Payer: 59 | Admitting: Speech Pathology

## 2016-06-21 DIAGNOSIS — R278 Other lack of coordination: Secondary | ICD-10-CM

## 2016-06-21 DIAGNOSIS — R41841 Cognitive communication deficit: Secondary | ICD-10-CM | POA: Diagnosis not present

## 2016-06-21 DIAGNOSIS — I69318 Other symptoms and signs involving cognitive functions following cerebral infarction: Secondary | ICD-10-CM

## 2016-06-21 DIAGNOSIS — M6281 Muscle weakness (generalized): Secondary | ICD-10-CM

## 2016-06-21 DIAGNOSIS — I69352 Hemiplegia and hemiparesis following cerebral infarction affecting left dominant side: Secondary | ICD-10-CM

## 2016-06-21 NOTE — Therapy (Signed)
[facility] [address] [place], [place], [address] Phone: [phone]   Fax:  [phone]  Speech Language Pathology Treatment  Patient Details  Name: Jon [name] MRN: [id number] Date of Birth: [birth year] Referring Provider: Dr. Alysia Penna   Encounter Date: 06/21/2016      End of Session - 06/21/16 1205    Visit Number 12   Number of Visits 17   Date for SLP Re-Evaluation 06/30/16   Authorization Type 20 ST    Authorization - Visit Number 12   Authorization - Number of Visits 20   SLP Start Time 4010   SLP Stop Time  1146   SLP Time Calculation (min) 41 min      Past Medical History:  Diagnosis Date  . Hypertension   . Liver damage   . Stroke [facility])     Past Surgical History:  Procedure Laterality Date  . TONSILLECTOMY AND ADENOIDECTOMY Bilateral    Age 60    There were no vitals filed for this visit.      Subjective Assessment - 06/21/16 1114    Subjective "I was confused by the golf homework"   Patient is accompained by: Family member   Special Tests [name]               ADULT SLP TREATMENT - 06/21/16 1114      General Information   Behavior/Cognition Alert;Cooperative;Pleasant mood     Treatment Provided   Treatment provided Cognitive-Linquistic     Cognitive-Linquistic Treatment   Treatment focused on Cognition   Skilled Treatment Pt utilized eye contact with occasional min verbal cues. Alternating attention, attention to details , and functional word problems with  usual mod A for oragnization and extended time and min A for alternating attention.  Pt utilized strategy, organization and attention in card sort game with object to use all cards each hand with extended time and occasional min a.     Assessment / Recommendations / Plan   Plan Continue with current plan of care     Progression Toward Goals   Progression toward goals Progressing toward goals             SLP Short Term Goals - 06/21/16 1204      SLP SHORT TERM GOAL #1   Title Pt will selectively attend to details on mildly complex cognitive linguistic task with distractions and 85% accuracy.   Status Achieved     SLP SHORT TERM GOAL #2   Title Pt will perform mildy complex organization, reasoning and time/money problems with 85% accuracy and rare min A   Status Not Met     SLP SHORT TERM GOAL #3   Title Pt  will utilize external memory aids/book to recall tasks, errands, lists, and reduce repetitive question asking/frustration over 3 sessions with occasional min A.   Time 1   Period Weeks   Status On-going     SLP SHORT TERM GOAL #4   Title Pt will face and maintain eye contact conversation partner on his left side with rare min A for 5 minutes conversation.    Status Partially Met     SLP SHORT TERM GOAL #5   Title pt will complete HEP with occasional min A (for dysarthria)   Status Achieved          SLP Long Term Goals - 06/21/16 1204      SLP LONG TERM GOAL #1   Title Pt will alterate attention  between 2 mildly complex cognitive linguistic tasks with 85% on each and occasional min A   Time 4   Period Weeks   Status On-going     SLP LONG TERM GOAL #2   Title Pt will solve moderately complex reasoning, functional math and organization problems with 90% accuracy and occasional min A   Time 4   Period Weeks   Status On-going     SLP LONG TERM GOAL #3   Title Pt will utilize external aids for medication management with rare min A over 3 sessions.   Time 4   Period Weeks   Status On-going     SLP LONG TERM GOAL #4   Title pt will complete HEP for dysarthria with rare min A over two sessions   Time 4   Period Weeks   Status On-going          Plan - 06/21/16 1159    Clinical Impression Statement Pt required mod A for complex attention to detail and thought organization tasks. Min A for mildly complex reasoning task - Pt continues to require verbal cues for  eye contact and to respond to non verbal cues, such as ST saying good bye and standing up - pt needs verbal cues to read the room and decide what he needs to do (ie: stand up and leave) . Continue skilled ST to maximize cognition to reduce caregiver burden.    Speech Therapy Frequency 2x / week   Duration --  3 weeks   Treatment/Interventions Compensatory strategies;Functional tasks;Patient/family education;Cueing hierarchy;Cognitive reorganization;Internal/external aids;SLP instruction and feedback   Potential to Achieve Goals Good   Potential Considerations Severity of impairments   Consulted and Agree with Plan of Care Patient;Family member/caregiver   Family Member Consulted spouse, [name]      Patient will benefit from skilled therapeutic intervention in order to improve the following deficits and impairments:   Cognitive communication deficit    Problem List Patient Active Problem List   Diagnosis Date Noted  . Neuropathic pain of left shoulder 05/19/2016  . Alcohol use disorder, severe, dependence ([place]) 02/15/2016  . Muscle contusion 02/11/2016  . Left flaccid hemiparesis   . Muscle spasticity   . Poor appetite   . Hyperlipidemia 01/14/2016  . Depression 01/14/2016  . Obesity 01/14/2016  . Hyperglycemia 01/14/2016  . Urinary tract infection, site not specified 01/14/2016  . Basal ganglia hemorrhage ([place]) 01/14/2016  . Hemiparesis affecting nondominant side as late effect of cerebrovascular accident ([place])   . Cognitive deficit, post-stroke   . Left hemiparesis ([place])   . Essential hypertension   . Dysphagia, post-stroke   . Gait disturbance, post-stroke   . Hyponatremia   . Thrombocytopenia ([place])   . Hemorrhagic stroke ([place]) R basal ganglia d/t HTN 01/08/2016    Jaicee Michelotti, Annye Rusk MS, CCC-SLP 06/21/2016, 12:06 PM  [facility] [address] [place], [place], [address] Phone: [phone]   Fax:   [phone]   Name: JAMICHEAL HEARD MRN: [id number] Date of Birth: 02/09/1956

## 2016-06-21 NOTE — Patient Instructions (Signed)
For marching exercise: Perform with rolling walker (Left hand on orthosis), place rolling walker behind counter and place chair behind you for safety. Make sure one knee is bent during exercises where you lie down, to support your back.

## 2016-06-21 NOTE — Therapy (Signed)
[facility] [address] [place], [place], [address] Phone: [phone]   Fax:  [phone]  Occupational Therapy Treatment  Patient Details  Name: Jon [name] MRN: [id number] Date of Birth: 02/06/1956 Referring Provider: Dr Alysia Penna  Encounter Date: 06/21/2016      OT End of Session - 06/21/16 1028    Visit Number 17   Number of Visits 22   Date for OT Re-Evaluation 07/31/16   Authorization Type UHC [facility])    Authorization Time Period renewal completed 05/31/16   Authorization - Visit Number 55   Authorization - Number of Visits 20   OT Start Time 1019   OT Stop Time 1100   OT Time Calculation (min) 41 min      Past Medical History:  Diagnosis Date  . Hypertension   . Liver damage   . Stroke [facility])     Past Surgical History:  Procedure Laterality Date  . TONSILLECTOMY AND ADENOIDECTOMY Bilateral    Age 60    There were no vitals filed for this visit.      Subjective Assessment - 06/21/16 1026    Subjective  Pt reports he has been doing stretching exercises   Patient Stated Goals Get my left arm going again and have more control of my left foot.   Currently in Pain? Yes   Pain Score 3    Pain Location Arm   Pain Orientation Left   Pain Descriptors / Indicators Aching   Pain Type Neuropathic pain   Pain Onset More than a month ago   Pain Frequency Intermittent   Aggravating Factors  malpositioning   Pain Relieving Factors repositioning   Multiple Pain Sites No         Treatment: Therapsit started checking progress towards short term goals. Therapist reinforced with pt and wife that he has 3 remaining visits out of his 20 total for insurance. Pt's wife verbalized understanding. Neuro re-ed seated edge of mat, body  on arm movements with lateral trunk flexion and weight shift for increased left side activition/ awareness, mod facilitation/ v.c. Weightbearing  thought  tilted stool with LUE for AA/ROM shoulder flexion/ elbow flexion/ extension, mod/ max facilitation. Gentle P/ROM wrist extension. NMES x 5 mins 50 pps, 250 pw, 10 secs cycle  Intensity 56 to  wrist and finger extensors with good response. No adverse reactions.                        OT Short Term Goals - 06/21/16 1029      OT SHORT TERM GOAL #1   Title Pt will I state precautions/signs and symptoms of CVA   Baseline VC's   Time 4   Period Weeks   Status Achieved     OT SHORT TERM GOAL #2   Title Pt will demonstrate Mod I HEP for LUE self ROM    Baseline VC's and tc's   Time 4   Period Weeks   Status Partially Met  Pt requires v.c./ assist from wife     OT SHORT TERM GOAL #3   Title Pt will I state 2-3 edema control techniques LUE    Baseline VC's/Min A   Status Deferred     OT SHORT TERM GOAL #4   Title Pt will use LUE as a stabilizer 10% of the time for ADLS/ IADLs.   Time 4   Period Weeks   Status Not Met  not using consistently     OT SHORT TERM GOAL #5   Title Pt will attend to left hemibody space with no more than min v.c. 75% of the time.   Time 4   Period Weeks   Status On-going  not consistent     OT SHORT TERM GOAL #6   Title Pt will verbalize understanding of proper LUE positioning to minimze risk for injury/ contracture.   Time 4   Period Weeks   Status On-going  needs reinforcement     OT SHORT TERM GOAL #7   Title Pt will demonstrate ability to grasp /release a cylindrical cone 2/3 trials with LUe in prep for functional use of LUE.   Time 4   Period Weeks   Status Not Met  pt is able to grasp cone, yet not release consistently           OT Long Term Goals - 05/31/16 1246      OT LONG TERM GOAL #1   Title Pt will be Mod I upgrade HEP LUE   Time 8   Period Weeks   Status On-going     OT LONG TERM GOAL #2   Title Pt will be Mod I simple snack prep in ADL kitchen (using manual w/c vs hemi walker).   Time 8   Period  Weeks   Status Achieved  achieved at a w/c level per wife     OT LONG TERM GOAL #3   Title Pt will be Mod I basic ADL transfers using hemi walker    Time 8   Period Weeks   Status On-going  needs supervison with walker     OT LONG TERM GOAL #4   Title Pt will be supervision LB dressing w/ a/e or DME PRN, using 1 handed technique   Time 8   Period Weeks   Status Achieved  met, per pt/ wife     OT LONG TERM GOAL #5   Title Pt will be supervision level simulated bathing UB/LB using 1 handed technique   Time 8   Period Weeks   Status On-going  min A     OT LONG TERM GOAL #6   Title Pt will use LUE as a stabilizer/ gross A 25% of the time for ADLs/ IADLs with pain no greater than 3/10.   Time 8   Period Weeks   Status New     OT LONG TERM GOAL #7   Title Pt will demonstrate 50% elbow flexion and 25 % elbow extension for LUE in prep for functional use.   Time 8   Period Weeks   Status New     OT LONG TERM GOAL #8   Title --               Plan - 06/21/16 1223    Clinical Impression Statement Pt is progressing towards goals, however he has not fully met short term goals due to cognitive deficits, left neglect and significant LUE weakness.   Rehab Potential Fair   OT Frequency 1x / week   OT Duration 8 weeks   OT Treatment/Interventions Self-care/ADL training;Therapeutic exercise;DME and/or AE instruction;Therapist, nutritional;Therapeutic activities;Patient/family education;Splinting;Neuromuscular education;Electrical Stimulation;Passive range of motion;Therapeutic exercises;Visual/perceptual remediation/compensation   Plan work towards unmet short and long term goals, begin checking long term goals.   Consulted and Agree with Plan of Care Patient;Family member/caregiver   Family Member Consulted Spouse      Patient will benefit from skilled  therapeutic intervention in order to improve the following deficits and impairments:  Abnormal gait, Decreased  balance, Decreased endurance, Difficulty walking, Impaired sensation, Impaired vision/preception, Pain, Increased edema, Decreased range of motion, Decreased cognition, Decreased activity tolerance, Decreased coordination, Decreased knowledge of use of DME, Decreased safety awareness, Decreased strength, Impaired UE functional use  Visit Diagnosis: Muscle weakness (generalized)  Hemiplegia and hemiparesis following cerebral infarction affecting left dominant side (H[facility])  Other lack of coordination  Other symptoms and signs involving cognitive functions following cerebral infarction    Problem List Patient Active Problem List   Diagnosis Date Noted  . Neuropathic pain of left shoulder 05/19/2016  . Alcohol use disorder, severe, dependence ([place]) 02/15/2016  . Muscle contusion 02/11/2016  . Left flaccid hemiparesis   . Muscle spasticity   . Poor appetite   . Hyperlipidemia 01/14/2016  . Depression 01/14/2016  . Obesity 01/14/2016  . Hyperglycemia 01/14/2016  . Urinary tract infection, site not specified 01/14/2016  . Basal ganglia hemorrhage ([place]) 01/14/2016  . Hemiparesis affecting nondominant side as late effect of cerebrovascular accident ([place])   . Cognitive deficit, post-stroke   . Left hemiparesis ([place])   . Essential hypertension   . Dysphagia, post-stroke   . Gait disturbance, post-stroke   . Hyponatremia   . Thrombocytopenia ([place])   . Hemorrhagic stroke ([place]) R basal ganglia d/t HTN 01/08/2016    Paytyn Mesta 06/21/2016, 12:27 PM  [facility] [address] [place], [place], [address] Phone: [phone]   Fax:  [phone]  Name: Jon [name] MRN: [id number] Date of Birth: [birth year]

## 2016-06-22 NOTE — Therapy (Signed)
[facility] [address] [place], [place], [address] Phone: [phone]   Fax:  [phone]  Physical Therapy Treatment  Patient Details  Name: Jon [name] MRN: [id number] Date of Birth: Feb 01, 1956 Referring Provider: Alysia Penna, MD  Encounter Date: 06/21/2016      PT End of Session - 06/22/16 0946    Visit Number 19   Number of Visits 20   Date for PT Re-Evaluation 07/04/16   Authorization Type UHC and Medicare? G-CODE AND PROGRESS NOTE EVERY 10TH VISIT.    Authorization - Visit Number 19   Authorization - Number of Visits 20   PT Start Time 0930   PT Stop Time 1013   PT Time Calculation (min) 43 min   Equipment Utilized During Treatment Gait belt   Activity Tolerance Patient tolerated treatment well   Behavior During Therapy WFL for tasks assessed/performed      Past Medical History:  Diagnosis Date  . Hypertension   . Liver damage   . Stroke [facility])     Past Surgical History:  Procedure Laterality Date  . TONSILLECTOMY AND ADENOIDECTOMY Bilateral    Age 60    There were no vitals filed for this visit.      Subjective Assessment - 06/21/16 0937    Subjective Pt denied falls or changes since last visit. Pt reports exercises are going well and his LBP is better.    Patient is accompained by: Family member   Pertinent History Pt goes by Jon [name]".   PMH significant for: HTN, ETOH abuse per pt's wife but pt has not consumed alcohol since ICH, liver damage   Patient Stated Goals Control ankle movement better and improve placement of the foot.    Currently in Pain? No/denies                         [facility] Adult PT Treatment/Exercise - 06/21/16 0942      Ambulation/Gait   Ambulation/Gait Yes   Ambulation/Gait Assistance 5: Supervision;6: Modified independent (Device/Increase time)   Ambulation/Gait Assistance Details Cues to improve stride length and LLE stance time, and to improve  L toe clearance. Tactile cues to keep LLE inside RW at all times but especially during turns and to decr. L hip ER.   Ambulation Distance (Feet) 75 Feet  120', 25'x2   Assistive device Rolling walker  with L h/o   Gait Pattern Step-through pattern;Decreased hip/knee flexion - left;Decreased stance time - left;Left flexed knee in stance;Lateral trunk lean to right;Decreased weight shift to left;Decreased step length - right;Trunk rotated posteriorly on left   Ambulation Surface Level;Indoor   Ramp 4: Min assist;Other (comment)  min guard   Ramp Details (indicate cue type and reason) Cues for weight shifting technique and min A to descend, min guard to ascend.   Curb 4: Min assist;Other (comment)  min guard   Curb Details (indicate cue type and reason) Cues for sequencing and technique.     High Level Balance   High Level Balance Activities Backward walking   High Level Balance Comments Performed in // bars with RW (with L h/o) and min guard to ensure safety, pt performed 4x7' with cues to improve weight shifting onto LLE to encourage incr. R step length. Pt also performed standing B marches with RW with cues for technique.                PT Education - 06/22/16 [phone]  Education provided Yes   Education Details PT educated pt on the importance of supporting LUE during gait and exercise. PT educated pt on performing B marches HEP with RW, with kitchen sink in front of pt and chair behind pt for safety and to support L UE. PT also educated pt to bend one knee while in supine and performing HEP to reduce LBP.    Person(s) Educated Patient;Spouse   Methods Explanation;Demonstration;Verbal cues;Handout   Comprehension Verbalized understanding;Returned demonstration          PT Short Term Goals - 06/06/16 1814      PT SHORT TERM GOAL #1   Title STG's = LTG's   Status Partially Met     PT SHORT TERM GOAL #2   Title Perform BERG and write goal prn.    Baseline 9/13: Berg score =  9/56   Status Achieved     PT SHORT TERM GOAL #3   Title Pt will amb. 200' with LRAD and S over even terrain to improve functional mobility.    Baseline Met 10/13.   Status Achieved     PT SHORT TERM GOAL #4   Title Pt will improve gait speed with LRAD to >/=0.49f/sec. to improve functional mobility.    Baseline 10/13: gait velocity with RW and L hand orthosis = .38 ft/sec   Status Not Met     PT SHORT TERM GOAL #5   Title Pt will improve Berg score from 9/56 to 13/56 to indicate improved standing balance.    Status Achieved           PT Long Term Goals - 06/06/16 1814      PT LONG TERM GOAL #1   Title Pt will consistently perform functional transfers with mod I and symmetrical LE placement/WB to indicate attention to/use of L side. (Target:  07/01/16)   Status New     PT LONG TERM GOAL #2   Title Pt will perform HEP with assistance of wife to maximize functional gains made in PT.  (07/01/16)   Status New     PT LONG TERM GOAL #3   Title Pt will negotiate standard curb step with LRAD and CGA of wife to increase pt access to community.  (07/01/16)   Status New               Plan - 06/22/16 0946    Clinical Impression Statement Pt continues to require cues to attend to LLE and to improve gait deviations. Pt noted to improve R step length with incr. LLE stance time. Pt demonstrated progress, as he reported no LBP during HEP. Continue with POC.   Rehab Potential Good   Clinical Impairments Affecting Rehab Potential co-morbidities   PT Frequency Other (comment)  3 sessions total   PT Duration 4 weeks   PT Treatment/Interventions ADLs/Self Care Home Management;Biofeedback;Canalith Repostioning;Electrical Stimulation;Cognitive remediation;Neuromuscular re-education;Balance training;Therapeutic exercise;Therapeutic activities;Functional mobility training;Stair training;Gait training;DME Instruction;Orthotic Fit/Training;Patient/family education;Wheelchair mobility  training;Manual techniques;Vestibular   PT Next Visit Plan G-CODE AND d/c.   PT Home Exercise Plan 10/13: modified LLE strengthening HEP (as wife reporting low back pain with single limb bridging).    Consulted and Agree with Plan of Care Patient;Family member/caregiver   Family Member Consulted wife-[name]      Patient will benefit from skilled therapeutic intervention in order to improve the following deficits and impairments:  Abnormal gait, Decreased endurance, Impaired sensation, Decreased knowledge of use of DME, Decreased strength, Impaired UE functional use, Decreased mobility, Decreased  balance, Decreased range of motion, Decreased cognition, Impaired flexibility, Postural dysfunction, Impaired tone  Visit Diagnosis: Hemiplegia and hemiparesis following cerebral infarction affecting left dominant side (HCC)  Other lack of coordination     Problem List Patient Active Problem List   Diagnosis Date Noted  . Neuropathic pain of left shoulder 05/19/2016  . Alcohol use disorder, severe, dependence ([place]) 02/15/2016  . Muscle contusion 02/11/2016  . Left flaccid hemiparesis   . Muscle spasticity   . Poor appetite   . Hyperlipidemia 01/14/2016  . Depression 01/14/2016  . Obesity 01/14/2016  . Hyperglycemia 01/14/2016  . Urinary tract infection, site not specified 01/14/2016  . Basal ganglia hemorrhage ([place]) 01/14/2016  . Hemiparesis affecting nondominant side as late effect of cerebrovascular accident ([place])   . Cognitive deficit, post-stroke   . Left hemiparesis ([place])   . Essential hypertension   . Dysphagia, post-stroke   . Gait disturbance, post-stroke   . Hyponatremia   . Thrombocytopenia ([place])   . Hemorrhagic stroke ([place]) R basal ganglia d/t HTN 01/08/2016    Zaidan Keeble L 06/22/2016, 9:48 AM  [facility] [address] [place], [place], [address] Phone: [phone]   Fax:  [phone]  Name:  AVANISH CERULLO MRN: [id number] Date of Birth: 1956/01/25  Geoffry Paradise, PT,DPT 06/22/16 9:49 AM Phone: [phone] Fax: [phone]

## 2016-06-23 ENCOUNTER — Ambulatory Visit: Payer: Self-pay

## 2016-06-23 ENCOUNTER — Ambulatory Visit: Payer: 59 | Admitting: Speech Pathology

## 2016-06-23 ENCOUNTER — Encounter: Payer: Self-pay | Admitting: Occupational Therapy

## 2016-06-23 DIAGNOSIS — R41841 Cognitive communication deficit: Secondary | ICD-10-CM | POA: Diagnosis not present

## 2016-06-23 NOTE — Therapy (Signed)
[facility] [address] [place], [place], [address] Phone: [phone]   Fax:  [phone]  Speech Language Pathology Treatment  Patient Details  Name: Jon [name] MRN: [id number] Date of Birth: [birth year] Referring Provider: Dr. Alysia Penna   Encounter Date: 06/23/2016      End of Session - 06/23/16 1018    Visit Number 13   Number of Visits 17   Date for SLP Re-Evaluation 06/30/16   Authorization Type 20 ST    Authorization - Visit Number 13   Authorization - Number of Visits 20   SLP Start Time 0630   SLP Stop Time  1601   SLP Time Calculation (min) 45 min   Activity Tolerance Patient tolerated treatment well      Past Medical History:  Diagnosis Date  . Hypertension   . Liver damage   . Stroke [facility])     Past Surgical History:  Procedure Laterality Date  . TONSILLECTOMY AND ADENOIDECTOMY Bilateral    Age 60    There were no vitals filed for this visit.      Subjective Assessment - 06/23/16 0942    Subjective "I started the homework last night, but then we had to go to a family party"   Patient is accompained by: Family member   Special Tests [name]   Currently in Pain? No/denies               ADULT SLP TREATMENT - 06/23/16 0943      General Information   Behavior/Cognition Alert;Cooperative;Pleasant mood     Treatment Provided   Treatment provided Cognitive-Linquistic     Cognitive-Linquistic Treatment   Treatment focused on Cognition   Skilled Treatment Mildly complex functional money and time math problems with extended time and rare min A for organizing the problem. Pt with 90% accuracy on these.     Assessment / Recommendations / Plan   Plan Continue with current plan of care     Progression Toward Goals   Progression toward goals Progressing toward goals            SLP Short Term Goals - 06/23/16 1017      SLP SHORT TERM GOAL #1   Title Pt will  selectively attend to details on mildly complex cognitive linguistic task with distractions and 85% accuracy.   Status Achieved     SLP SHORT TERM GOAL #2   Title Pt will perform mildy complex organization, reasoning and time/money problems with 85% accuracy and rare min A   Status Achieved     SLP SHORT TERM GOAL #3   Title Pt  will utilize external memory aids/book to recall tasks, errands, lists, and reduce repetitive question asking/frustration over 3 sessions with occasional min A.   Time 1   Period Weeks   Status On-going     SLP SHORT TERM GOAL #4   Title Pt will face and maintain eye contact conversation partner on his left side with rare min A for 5 minutes conversation.    Status Partially Met     SLP SHORT TERM GOAL #5   Title pt will complete HEP with occasional min A (for dysarthria)   Status Achieved          SLP Long Term Goals - 06/23/16 1018      SLP LONG TERM GOAL #1   Title Pt will alterate attention between 2 mildly complex cognitive linguistic tasks with 85% on each and occasional min  A   Time 4   Period Weeks   Status On-going     SLP LONG TERM GOAL #2   Title Pt will solve moderately complex reasoning, functional math and organization problems with 90% accuracy and occasional min A   Time 4   Period Weeks   Status On-going     SLP LONG TERM GOAL #3   Title Pt will utilize external aids for medication management with rare min A over 3 sessions.   Time 4   Period Weeks   Status On-going     SLP LONG TERM GOAL #4   Title pt will complete HEP for dysarthria with rare min A over two sessions   Time 4   Period Weeks   Status On-going          Plan - 06/23/16 1010    Clinical Impression Statement Pt required rare min A and extended time for functional time and money problem solving - He indepdently use written cues when he needed to keep track of multisteps. Continue skilled ST to maximize cognition to reduce caregiver burden.    Speech Therapy  Frequency 2x / week   Treatment/Interventions Compensatory strategies;Functional tasks;Patient/family education;Cueing hierarchy;Cognitive reorganization;Internal/external aids;SLP instruction and feedback   Potential to Achieve Goals Good   Potential Considerations Severity of impairments   Consulted and Agree with Plan of Care Patient;Family member/caregiver   Family Member Consulted spouse, [name]      Patient will benefit from skilled therapeutic intervention in order to improve the following deficits and impairments:   Cognitive communication deficit    Problem List Patient Active Problem List   Diagnosis Date Noted  . Neuropathic pain of left shoulder 05/19/2016  . Alcohol use disorder, severe, dependence ([place]) 02/15/2016  . Muscle contusion 02/11/2016  . Left flaccid hemiparesis   . Muscle spasticity   . Poor appetite   . Hyperlipidemia 01/14/2016  . Depression 01/14/2016  . Obesity 01/14/2016  . Hyperglycemia 01/14/2016  . Urinary tract infection, site not specified 01/14/2016  . Basal ganglia hemorrhage ([place]) 01/14/2016  . Hemiparesis affecting nondominant side as late effect of cerebrovascular accident ([place])   . Cognitive deficit, post-stroke   . Left hemiparesis ([place])   . Essential hypertension   . Dysphagia, post-stroke   . Gait disturbance, post-stroke   . Hyponatremia   . Thrombocytopenia ([place])   . Hemorrhagic stroke ([place]) R basal ganglia d/t HTN 01/08/2016    Hillery Zachman, Annye Rusk MS, CCC-SLP 06/23/2016, 10:20 AM  [facility] [address] [place], [place], [address] Phone: [phone]   Fax:  [phone]   Name: Jon [name] MRN: [id number] Date of Birth: [birth year]

## 2016-06-24 ENCOUNTER — Telehealth: Payer: Self-pay

## 2016-06-24 NOTE — Telephone Encounter (Signed)
Patient wife called stating her husband has been hallucinating and seeing "things" only happened once.  Please advise

## 2016-06-28 ENCOUNTER — Ambulatory Visit: Payer: 59 | Attending: Physical Medicine & Rehabilitation | Admitting: Occupational Therapy

## 2016-06-28 ENCOUNTER — Ambulatory Visit: Payer: 59 | Admitting: Speech Pathology

## 2016-06-28 ENCOUNTER — Telehealth: Payer: Self-pay | Admitting: Neurology

## 2016-06-28 DIAGNOSIS — I69352 Hemiplegia and hemiparesis following cerebral infarction affecting left dominant side: Secondary | ICD-10-CM | POA: Diagnosis present

## 2016-06-28 DIAGNOSIS — M6281 Muscle weakness (generalized): Secondary | ICD-10-CM

## 2016-06-28 DIAGNOSIS — R278 Other lack of coordination: Secondary | ICD-10-CM | POA: Diagnosis present

## 2016-06-28 DIAGNOSIS — I69318 Other symptoms and signs involving cognitive functions following cerebral infarction: Secondary | ICD-10-CM

## 2016-06-28 DIAGNOSIS — R41841 Cognitive communication deficit: Secondary | ICD-10-CM | POA: Diagnosis present

## 2016-06-28 DIAGNOSIS — R471 Dysarthria and anarthria: Secondary | ICD-10-CM | POA: Diagnosis present

## 2016-06-28 NOTE — Patient Instructions (Signed)
Seated position left hand on the handle of hemiwalker, with hemiwalker tilted,  with assistance from your wife(she should support under elbow / forearm) ,  push arm forward to straighten elbow slightly then bend elbow to bring arm back towards your body. Perform 10 reps 2x day Make sure that you are sitting up tall, and that your knee is out of the way of hemiwalker, stop if you have pain.

## 2016-06-28 NOTE — Therapy (Addendum)
[facility] [address] [place], [place], [address] Phone: [phone]   Fax:  [phone]  Occupational Therapy Treatment  Patient Details  Name: Jon [name] MRN: [id number] Date of Birth: Jul 20, 1956 Referring Provider: Dr Alysia Penna  Encounter Date: 06/28/2016      OT End of Session - 06/28/16 1028    Visit Number 18   Number of Visits 22   Date for OT Re-Evaluation 07/31/16   Authorization Type UHC [facility])    Authorization Time Period renewal completed 05/31/16   Authorization - Visit Number 18   Authorization - Number of Visits 20   OT Start Time 1791   OT Stop Time 1100   OT Time Calculation (min) 39 min      Past Medical History:  Diagnosis Date  . Hypertension   . Liver damage   . Stroke [facility])     Past Surgical History:  Procedure Laterality Date  . TONSILLECTOMY AND ADENOIDECTOMY Bilateral    Age 60    There were no vitals filed for this visit.      Subjective Assessment - 06/28/16 1028    Subjective  Pt reports no arm pain   Patient Stated Goals Get my left arm going again and have more control of my left foot.   Currently in Pain? Yes   Pain Score 1    Pain Location Knee   Pain Orientation Left   Pain Descriptors / Indicators Aching   Pain Type Neuropathic pain   Pain Onset More than a month ago   Aggravating Factors  malpositioning   Pain Relieving Factors repositioning   Multiple Pain Sites No             Treatment: P/ROM wrist, finger and forearm P/ROM. Gentle weightbearing edge of mat through LUE, mod - max facilitation Pt / wife were educated in AA/ROM low range shoulder flexion with elbow flexion / extension, hand supported on hemi walker.(Pt's wife returned demonstration assisting pt.) NMES 50 pps, 250 pw, 10 secs cycle intensity 35, x 10 mins no adverse reactions with pt facilitating wrist and finger ext during on cycle.                  OT Education - 06/28/16 1832    Education provided Yes   Education Details AA/ROM with hand supported on hemiwalker, see pt instructions   Person(s) Educated Patient;Spouse   Methods Explanation;Demonstration;Tactile cues;Verbal cues;Handout   Comprehension Verbalized understanding;Returned demonstration;Verbal cues required          OT Short Term Goals - 06/21/16 1029      OT SHORT TERM GOAL #1   Title Pt will I state precautions/signs and symptoms of CVA   Baseline VC's   Time 4   Period Weeks   Status Achieved     OT SHORT TERM GOAL #2   Title Pt will demonstrate Mod I HEP for LUE self ROM    Baseline VC's and tc's   Time 4   Period Weeks   Status Partially Met  Pt requires v.c./ assist from wife     OT SHORT TERM GOAL #3   Title Pt will I state 2-3 edema control techniques LUE    Baseline VC's/Min A   Status Deferred     OT SHORT TERM GOAL #4   Title Pt will use LUE as a stabilizer 10% of the time for ADLS/ IADLs.   Time 4   Period Weeks  Status Not Met  not using consistently     OT SHORT TERM GOAL #5   Title Pt will attend to left hemibody space with no more than min v.c. 75% of the time.   Time 4   Period Weeks   Status On-going  not consistent     OT SHORT TERM GOAL #6   Title Pt will verbalize understanding of proper LUE positioning to minimze risk for injury/ contracture.   Time 4   Period Weeks   Status On-going  needs reinforcement     OT SHORT TERM GOAL #7   Title Pt will demonstrate ability to grasp /release a cylindrical cone 2/3 trials with LUe in prep for functional use of LUE.   Time 4   Period Weeks   Status Not Met  pt is able to grasp cone, yet not release consistently           OT Long Term Goals - 05/31/16 1246      OT LONG TERM GOAL #1   Title Pt will be Mod I upgrade HEP LUE   Time 8   Period Weeks   Status On-going     OT LONG TERM GOAL #2   Title Pt will be Mod I simple snack prep in ADL kitchen (using manual  w/c vs hemi walker).   Time 8   Period Weeks   Status Achieved  achieved at a w/c level per wife     OT LONG TERM GOAL #3   Title Pt will be Mod I basic ADL transfers using hemi walker    Time 8   Period Weeks   Status On-going  needs supervison with walker     OT LONG TERM GOAL #4   Title Pt will be supervision LB dressing w/ a/e or DME PRN, using 1 handed technique   Time 8   Period Weeks   Status Achieved  met, per pt/ wife     OT LONG TERM GOAL #5   Title Pt will be supervision level simulated bathing UB/LB using 1 handed technique   Time 8   Period Weeks   Status On-going  min A     OT LONG TERM GOAL #6   Title Pt will use LUE as a stabilizer/ gross A 25% of the time for ADLs/ IADLs with pain no greater than 3/10.   Time 8   Period Weeks   Status New     OT LONG TERM GOAL #7   Title Pt will demonstrate 50% elbow flexion and 25 % elbow extension for LUE in prep for functional use.   Time 8   Period Weeks   Status New     OT LONG TERM GOAL #8   Title --               Plan - 06/28/16 1825    Clinical Impression Statement Pt is progressing towards goals. Pt and wife verbalize understanding of AA/ROM for HEP   Rehab Potential Fair   OT Frequency 1x / week   OT Duration 8 weeks   OT Treatment/Interventions Self-care/ADL training;Therapeutic exercise;DME and/or AE instruction;Therapist, nutritional;Therapeutic activities;Patient/family education;Splinting;Neuromuscular education;Electrical Stimulation;Passive range of motion;Therapeutic exercises;Visual/perceptual remediation/compensation   Plan continue to work towards unmet goals.   Consulted and Agree with Plan of Care Patient;Family member/caregiver      Patient will benefit from skilled therapeutic intervention in order to improve the following deficits and impairments:  Abnormal gait, Decreased balance, Decreased endurance, Difficulty  walking, Impaired sensation, Impaired vision/preception,  Pain, Increased edema, Decreased range of motion, Decreased cognition, Decreased activity tolerance, Decreased coordination, Decreased knowledge of use of DME, Decreased safety awareness, Decreased strength, Impaired UE functional use  Visit Diagnosis: Hemiplegia and hemiparesis following cerebral infarction affecting left dominant side (HCC)  Other lack of coordination  Muscle weakness (generalized)  Other symptoms and signs involving cognitive functions following cerebral infarction    Problem List Patient Active Problem List   Diagnosis Date Noted  . Neuropathic pain of left shoulder 05/19/2016  . Alcohol use disorder, severe, dependence ([place]) 02/15/2016  . Muscle contusion 02/11/2016  . Left flaccid hemiparesis   . Muscle spasticity   . Poor appetite   . Hyperlipidemia 01/14/2016  . Depression 01/14/2016  . Obesity 01/14/2016  . Hyperglycemia 01/14/2016  . Urinary tract infection, site not specified 01/14/2016  . Basal ganglia hemorrhage ([place]) 01/14/2016  . Hemiparesis affecting nondominant side as late effect of cerebrovascular accident ([place])   . Cognitive deficit, post-stroke   . Left hemiparesis ([place])   . Essential hypertension   . Dysphagia, post-stroke   . Gait disturbance, post-stroke   . Hyponatremia   . Thrombocytopenia ([place])   . Hemorrhagic stroke ([place]) R basal ganglia d/t HTN 01/08/2016    RINE,KATHRYN 06/28/2016, 6:34 PM  [facility] [address] [place], [place], [address] Phone: [phone]   Fax:  [phone]  Name: BROOKS KINNAN MRN: [id number] Date of Birth: 05-May-1956

## 2016-06-28 NOTE — Telephone Encounter (Signed)
Patient's wife states he is having hallucinations in the morning - needs a possible medication change. Best call back is [phone]

## 2016-06-28 NOTE — Telephone Encounter (Signed)
This is likely related to his stroke, usually it is perceptual deficits that make some other object room appeared to looked differently

## 2016-06-28 NOTE — Telephone Encounter (Signed)
I spoke with Mrs Jon [name] and he thinks he sees spiders and that she is throwing string on him.  Dr Wynn BankerKirsteins says it is part of his stroke process but to make sure that he is not drinking alcohol. I let her know this information.

## 2016-06-28 NOTE — Therapy (Signed)
[facility] [address] [place], [place], [address] Phone: [phone]   Fax:  [phone]  Speech Language Pathology Treatment  Patient Details  Name: Jon [name] MRN: [id number] Date of Birth: [birth year] Referring Provider: Dr. Alysia [name]   Encounter Date: 06/28/2016      End of Session - 06/28/16 1019    Visit Number 14   Number of Visits 17   Date for SLP Re-Evaluation 06/30/16   Authorization Type 20 ST   Authorization - Visit Number 14   Authorization - Number of Visits 20   SLP Start Time 6962   SLP Stop Time  9528   SLP Time Calculation (min) 46 min      Past Medical History:  Diagnosis Date  . Hypertension   . Liver damage   . Stroke Jon [name])     Past Surgical History:  Procedure Laterality Date  . TONSILLECTOMY AND ADENOIDECTOMY Bilateral    Age 60    There were no vitals filed for this visit.      Subjective Assessment - 06/28/16 0939    Subjective "He got 100% on his homework"   Patient is accompained by: Family member   Special Tests Jon [name]               ADULT SLP TREATMENT - 06/28/16 0939      General Information   Behavior/Cognition Alert;Cooperative;Pleasant mood     Treatment Provided   Treatment provided Cognitive-Linquistic     Cognitive-Linquistic Treatment   Treatment focused on Cognition   Skilled Treatment Checked goals with pt and spouse - Spouse reports pt remembering his meds and reminding wife to get his medications. Pt and spouse report speech is much improved and pt is 100% intelligible. Pt alternated attention between flipping through calenda, locating and writing down days and date of specified holidays with extended time and rare min verbal cues for attention to details - when distracted with conversation pt reported "Now I'm confused" but was able to return to taks and find where he left off with minimal extra time and no verbal cues.       Assessment / Recommendations / Plan   Plan Continue with current plan of care     Progression Toward Goals   Progression toward goals Progressing toward goals            SLP Short Term Goals - 06/28/16 1018      SLP SHORT TERM GOAL #1   Title Pt will selectively attend to details on mildly complex cognitive linguistic task with distractions and 85% accuracy.   Status Achieved     SLP SHORT TERM GOAL #2   Title Pt will perform mildy complex organization, reasoning and time/money problems with 85% accuracy and rare min A   Status Achieved     SLP SHORT TERM GOAL #3   Title Pt  will utilize external memory aids/book to recall tasks, errands, lists, and reduce repetitive question asking/frustration over 3 sessions with occasional min A.   Time 1   Period Weeks   Status On-going     SLP SHORT TERM GOAL #4   Title Pt will face and maintain eye contact conversation partner on his left side with rare min A for 5 minutes conversation.    Status Partially Met     SLP SHORT TERM GOAL #5   Title pt will complete HEP with occasional min A (for dysarthria)   Status Achieved  SLP Long Term Goals - 06/28/16 1018      SLP LONG TERM GOAL #1   Title Pt will alterate attention between 2 mildly complex cognitive linguistic tasks with 85% on each and occasional min A   Time 4   Period Weeks   Status Achieved     SLP LONG TERM GOAL #2   Title Pt will solve moderately complex reasoning, functional math and organization problems with 90% accuracy and occasional min A   Time 4   Period Weeks   Status Achieved     SLP LONG TERM GOAL #3   Title Pt will utilize external aids for medication management with rare min A over 3 sessions.   Time 4   Period Weeks   Status Achieved     SLP LONG TERM GOAL #4   Title pt will complete HEP for dysarthria with rare min A over two sessions   Baseline pt intelligible   Time 4   Period Weeks   Status Deferred          Plan -  06/28/16 1011    Clinical Impression Statement Pt continues to make progress with alternating attention, reasoning and functional math with rare to occasional min A. Overall processing remains slow and pt requires ongoing verbal cues to divided attention. Consider re- taking MoCA next session to assess progress and consider new goals/ re-new.   Speech Therapy Frequency 2x / week   Treatment/Interventions Compensatory strategies;Functional tasks;Patient/family education;Cueing hierarchy;Cognitive reorganization;Internal/external aids;SLP instruction and feedback   Potential to Achieve Goals Good   Potential Considerations Severity of impairments   Consulted and Agree with Plan of Care Patient;Family member/caregiver   Family Member Consulted spouse, Jon [name]      Patient will benefit from skilled therapeutic intervention in order to improve the following deficits and impairments:   Cognitive communication deficit    Problem List Patient Active Problem List   Diagnosis Date Noted  . Neuropathic pain of left shoulder 05/19/2016  . Alcohol use disorder, severe, dependence ([place]) 02/15/2016  . Muscle contusion 02/11/2016  . Left flaccid hemiparesis   . Muscle spasticity   . Poor appetite   . Hyperlipidemia 01/14/2016  . Depression 01/14/2016  . Obesity 01/14/2016  . Hyperglycemia 01/14/2016  . Urinary tract infection, site not specified 01/14/2016  . Basal ganglia hemorrhage ([place]) 01/14/2016  . Hemiparesis affecting nondominant side as late effect of cerebrovascular accident ([place])   . Cognitive deficit, post-stroke   . Left hemiparesis ([place])   . Essential hypertension   . Dysphagia, post-stroke   . Gait disturbance, post-stroke   . Hyponatremia   . Thrombocytopenia ([place])   . Hemorrhagic stroke ([place]) R basal ganglia d/t HTN 01/08/2016    Jon [name], Annye Rusk MS, CCC-SLP 06/28/2016, 10:21 AM  [facility] [address] [place], [place], [address] Phone: [phone]   Fax:  [phone]   Name: Jon [name] MRN: [id number] Date of Birth: 1956/02/17

## 2016-06-29 NOTE — Telephone Encounter (Signed)
Lft vm for patients wife to call back about her husband having hallucinations. Pt is taking zolof.

## 2016-06-30 ENCOUNTER — Ambulatory Visit (HOSPITAL_BASED_OUTPATIENT_CLINIC_OR_DEPARTMENT_OTHER): Payer: 59 | Admitting: Physical Medicine & Rehabilitation

## 2016-06-30 ENCOUNTER — Ambulatory Visit: Payer: 59 | Admitting: Speech Pathology

## 2016-06-30 ENCOUNTER — Ambulatory Visit: Payer: 59

## 2016-06-30 ENCOUNTER — Encounter: Payer: Self-pay | Admitting: Physical Medicine & Rehabilitation

## 2016-06-30 ENCOUNTER — Encounter: Payer: 59 | Attending: Physical Medicine & Rehabilitation

## 2016-06-30 VITALS — BP 121/72 | HR 51

## 2016-06-30 DIAGNOSIS — G8194 Hemiplegia, unspecified affecting left nondominant side: Secondary | ICD-10-CM | POA: Diagnosis not present

## 2016-06-30 DIAGNOSIS — R252 Cramp and spasm: Secondary | ICD-10-CM | POA: Diagnosis not present

## 2016-06-30 DIAGNOSIS — I69319 Unspecified symptoms and signs involving cognitive functions following cerebral infarction: Secondary | ICD-10-CM

## 2016-06-30 DIAGNOSIS — I69398 Other sequelae of cerebral infarction: Secondary | ICD-10-CM | POA: Diagnosis not present

## 2016-06-30 DIAGNOSIS — I69352 Hemiplegia and hemiparesis following cerebral infarction affecting left dominant side: Secondary | ICD-10-CM | POA: Diagnosis not present

## 2016-06-30 DIAGNOSIS — R278 Other lack of coordination: Secondary | ICD-10-CM

## 2016-06-30 DIAGNOSIS — Z5189 Encounter for other specified aftercare: Secondary | ICD-10-CM | POA: Insufficient documentation

## 2016-06-30 DIAGNOSIS — F1010 Alcohol abuse, uncomplicated: Secondary | ICD-10-CM | POA: Diagnosis not present

## 2016-06-30 DIAGNOSIS — R41841 Cognitive communication deficit: Secondary | ICD-10-CM

## 2016-06-30 DIAGNOSIS — I10 Essential (primary) hypertension: Secondary | ICD-10-CM | POA: Insufficient documentation

## 2016-06-30 DIAGNOSIS — R269 Unspecified abnormalities of gait and mobility: Secondary | ICD-10-CM | POA: Diagnosis not present

## 2016-06-30 DIAGNOSIS — G8110 Spastic hemiplegia affecting unspecified side: Secondary | ICD-10-CM

## 2016-06-30 DIAGNOSIS — I629 Nontraumatic intracranial hemorrhage, unspecified: Secondary | ICD-10-CM | POA: Insufficient documentation

## 2016-06-30 DIAGNOSIS — I639 Cerebral infarction, unspecified: Secondary | ICD-10-CM | POA: Diagnosis present

## 2016-06-30 NOTE — Therapy (Signed)
[facility] [address] [place], [place], [address] Phone: [phone]   Fax:  [phone]  Physical Therapy Treatment  Patient Details  Name: Jon [name] MRN: [id number] Date of Birth: January 29, 1956 Referring Provider: Alysia Penna, MD  Encounter Date: 06/30/2016      PT End of Session - 06/30/16 1412    Visit Number 20   Number of Visits 20   Date for PT Re-Evaluation 07/04/16   Authorization Type UHC and Medicare? G-CODE AND PROGRESS NOTE EVERY 10TH VISIT.    Authorization - Visit Number 20   Authorization - Number of Visits 20   PT Start Time 0174   PT Stop Time 1401   PT Time Calculation (min) 46 min   Equipment Utilized During Treatment Gait belt   Activity Tolerance Patient tolerated treatment well   Behavior During Therapy WFL for tasks assessed/performed      Past Medical History:  Diagnosis Date  . Hypertension   . Liver damage   . Stroke [facility])     Past Surgical History:  Procedure Laterality Date  . TONSILLECTOMY AND ADENOIDECTOMY Bilateral    Age 60    There were no vitals filed for this visit.      Subjective Assessment - 06/30/16 1329    Subjective Pt denied falls or changes since last visit. Wife called insurance co. requesting add'l visits.    Patient is accompained by: Family member   Pertinent History Pt goes by [name].   PMH significant for: HTN, ETOH abuse per pt's wife but pt has not consumed alcohol since ICH, liver damage   Patient Stated Goals Control ankle movement better and improve placement of the foot.    Currently in Pain? No/denies                         [facility] Adult PT Treatment/Exercise - 06/30/16 1338      Transfers   Transfers Sit to Stand;Stand to Sit   Sit to Stand 6: Modified independent (Device/Increase time)   Stand to Sit 6: Modified independent (Device/Increase time)   Stand to Sit Details Pt able to perform STS txfs and place L hand  in L h/o on RW IND. No LOB during txfs and demo'd safe techinque.     Ambulation/Gait   Ambulation/Gait Yes   Ambulation/Gait Assistance 5: Supervision;6: Modified independent (Device/Increase time)   Ambulation/Gait Assistance Details Cues to decrease L hip protraction and foot placement. Pt progressed to MOD I level.   Ambulation Distance (Feet) 100 Feet  75, 50'x2, 20'   Assistive device Rolling walker  with L h/o   Gait Pattern Step-through pattern;Decreased hip/knee flexion - left;Decreased stance time - left;Left flexed knee in stance;Lateral trunk lean to right;Decreased weight shift to left;Decreased step length - right;Trunk rotated posteriorly on left   Ambulation Surface Level;Indoor   Gait velocity 0.63f/sec.   Curb 4: Min assist;Other (comment)  min guard   Curb Details (indicate cue type and reason) Cues for sequencing with RW c L h/o, min guard to ascend curb and min A to descend curb. Performed x1 rep     Standardized Balance Assessment   Standardized Balance Assessment Timed Up and Go Test     Timed Up and Go Test   TUG Normal TUG   Normal TUG (seconds) 73.2  with RW and L h/o on RW           Self Care:  PT Education - 06/30/16 1410    Education provided Yes   Education Details PT discussed goal progress and outcome measure results. PT discussed d/c, as pt has maximized funcitonal gains at this time. PT strongly encouraged pt to continue HEP as prescribed and PT reviewed HEP and emphasized the difference between stretches and strengthening. PT educated pt that he can be referred back to PT if there are any changes (improvements or decline).    Person(s) Educated Patient;Spouse   Methods Explanation;Verbal cues   Comprehension Verbalized understanding          PT Short Term Goals - 06/06/16 1814      PT SHORT TERM GOAL #1   Title STG's = LTG's   Status Partially Met     PT SHORT TERM GOAL #2   Title Perform BERG and write goal prn.    Baseline  9/13: Berg score = 9/56   Status Achieved     PT SHORT TERM GOAL #3   Title Pt will amb. 200' with LRAD and S over even terrain to improve functional mobility.    Baseline Met 10/13.   Status Achieved     PT SHORT TERM GOAL #4   Title Pt will improve gait speed with LRAD to >/=0.75f/sec. to improve functional mobility.    Baseline 10/13: gait velocity with RW and L hand orthosis = .38 ft/sec   Status Not Met     PT SHORT TERM GOAL #5   Title Pt will improve Berg score from 9/56 to 13/56 to indicate improved standing balance.    Status Achieved           PT Long Term Goals - 06/30/16 1417      PT LONG TERM GOAL #1   Title Pt will consistently perform functional transfers with mod I and symmetrical LE placement/WB to indicate attention to/use of L side. (Target:  07/01/16)   Status Achieved     PT LONG TERM GOAL #2   Title Pt will perform HEP with assistance of wife to maximize functional gains made in PT.  (07/01/16)   Status Achieved     PT LONG TERM GOAL #3   Title Pt will negotiate standard curb step with LRAD and CGA of wife to increase pt access to community.  (07/01/16)   Status Partially Met               Plan - 06/30/16 1412    Clinical Impression Statement Pt met LTGs 1 and 2, pt partially met LTG 3. Pt's gait speed indicates pt is still at risk for falls, as does pt's TUG time. Please see pt d/c summary for details.    Rehab Potential Good   Clinical Impairments Affecting Rehab Potential co-morbidities   PT Frequency Other (comment)  3 sessions total   PT Duration 4 weeks   PT Treatment/Interventions ADLs/Self Care Home Management;Biofeedback;Canalith Repostioning;Electrical Stimulation;Cognitive remediation;Neuromuscular re-education;Balance training;Therapeutic exercise;Therapeutic activities;Functional mobility training;Stair training;Gait training;DME Instruction;Orthotic Fit/Training;Patient/family education;Wheelchair mobility training;Manual  techniques;Vestibular   PT Next Visit Plan G-CODE AND d/c.   PT Home Exercise Plan 10/13: modified LLE strengthening HEP (as wife reporting low back pain with single limb bridging).    Consulted and Agree with Plan of Care Patient;Family member/caregiver   Family Member Consulted wife-[name]      Patient will benefit from skilled therapeutic intervention in order to improve the following deficits and impairments:  Abnormal gait, Decreased endurance, Impaired sensation, Decreased knowledge of use of DME, Decreased strength, Impaired  UE functional use, Decreased mobility, Decreased balance, Decreased range of motion, Decreased cognition, Impaired flexibility, Postural dysfunction, Impaired tone  Visit Diagnosis: Hemiplegia and hemiparesis following cerebral infarction affecting left dominant side (HCC)  Other lack of coordination       G-Codes - 07/26/16 1417    Functional Assessment Tool Used Gait speed with RW and L hand orthosis: 0.70f/sec; TUG with rolling walker= 73.2 sec.   Functional Limitation Mobility: Walking and moving around   Mobility: Walking and Moving Around Goal Status [phone] At least 20 percent but less than 40 percent impaired, limited or restricted   Mobility: Walking and Moving Around Discharge Status [phone] At least 60 percent but less than 80 percent impaired, limited or restricted      Problem List Patient Active Problem List   Diagnosis Date Noted  . Neuropathic pain of left shoulder 05/19/2016  . Alcohol use disorder, severe, dependence ([place] 02/15/2016  . Muscle contusion 02/11/2016  . Left flaccid hemiparesis   . Muscle spasticity   . Poor appetite   . Hyperlipidemia 01/14/2016  . Depression 01/14/2016  . Obesity 01/14/2016  . Hyperglycemia 01/14/2016  . Urinary tract infection, site not specified 01/14/2016  . Basal ganglia hemorrhage ([place] 01/14/2016  . Hemiparesis affecting nondominant side as late effect of cerebrovascular accident ([place]   .  Cognitive deficit, post-stroke   . Left hemiparesis ([place]   . Essential hypertension   . Dysphagia, post-stroke   . Gait disturbance, post-stroke   . Hyponatremia   . Thrombocytopenia ([place]   . Hemorrhagic stroke ([place] R basal ganglia d/t HTN 01/08/2016    Teasha Murrillo L 101-08-2016 2:22 PM  [facility] [address]Phone: [phone]  Fax:  [phone] Name: Jon ELLITHORPEMRN: [id number] of Birth: [birth year] PHYSICAL THERAPY DISCHARGE SUMMARY  Visits from Start of Care: 20  Current functional level related to goals / functional outcomes:     PT Long Term Goals - [id number]      PT LONG TERM GOAL #1   Title Pt will consistently perform functional transfers with mod I and symmetrical LE placement/WB to indicate attention to/use of L side. (Target:  07/01/16)   Status Achieved     PT LONG TERM GOAL #2   Title Pt will perform HEP with assistance of wife to maximize functional gains made in PT.  (07/01/16)   Status Achieved     PT LONG TERM GOAL #3   Title Pt will negotiate standard curb step with LRAD and CGA of wife to increase pt access to community.  (07/01/16)   Status Partially Met        Remaining deficits: L sided weakness and impaired balance   Education / Equipment: HEP, L h/o on RW  Plan: Patient agrees to discharge.  Patient goals were met. Patient is being discharged due to meeting the stated rehab goals.  ?????        JGeoffry Paradise PT,DPT 101/02/182:23 PM Phone: [phone]: [phone]

## 2016-06-30 NOTE — Patient Instructions (Signed)
Botox may help relax finger flexors

## 2016-06-30 NOTE — Telephone Encounter (Signed)
Patient has an appt next week with Dr. Roda ShuttersXu. These issues can be discuss at that visit.

## 2016-06-30 NOTE — Progress Notes (Signed)
Subjective:    Patient ID: Jon [name], male    DOB: 01-11-1956, 60 y.o.   MRN: [id number] 60 year old male with right basal ganglia hemorrhage January 08 2016 HPI  When pt is half asleep pt states he sees spiders on the ceiling.   When he awakens, he knows that they are not spiders. No falls. Ambulating, sometimes with quad cane, sometimes with walker. Contact guard assistance Noticing increasing fisting of the left upper extremity. Continues to receive outpatient  OT services as well as speech therapy Min assist with dressing, still has left neglect Just discharged from physical therapy today  Pain Inventory Average Pain 0 Pain Right Now 0 My pain is n/a  In the last 24 hours, has pain interfered with the following? General activity n/a Relation with others n/a Enjoyment of life n/a What TIME of day is your pain at its worst? n/a Sleep (in general) Good  Pain is worse with: n/a Pain improves with: n/a Relief from Meds: n/a  Mobility use a walker how many minutes can you walk? 10 ability to climb steps?  no do you drive?  no use a wheelchair transfers alone  Function not employed: date last employed 01/07/2016 I need assistance with the following:  meal prep, household duties and shopping  Neuro/Psych numbness trouble walking  Prior Studies Any changes since last visit?  no  Physicians involved in your care Any changes since last visit?  no   No family history on file. Social History   Social History  . Marital status: Married    Spouse name: N/A  . Number of children: N/A  . Years of education: N/A   Social History Main Topics  . Smoking status: Former Smoker    Packs/day: 0.50    Years: 19.00    Types: Cigarettes    Quit date: 02/22/2000  . Smokeless tobacco: Never Used  . Alcohol use No     Comment: Wife reports chronic alcohol use.   . Drug use: No     Comment: teens to early 20's  . Sexual activity: Not Asked   Other Topics Concern  .  None   Social History Narrative  . None   Past Surgical History:  Procedure Laterality Date  . TONSILLECTOMY AND ADENOIDECTOMY Bilateral    Age 26   Past Medical History:  Diagnosis Date  . Hypertension   . Liver damage   . Stroke (HCC)    BP 121/72   Pulse (!) 51   SpO2 94%   Opioid Risk Score:   Fall Risk Score:  `1  Depression screen PHQ 2/9  Depression screen [facility] 2/9 05/19/2016 04/21/2016  Decreased Interest 0 0  Down, Depressed, Hopeless 0 0  PHQ - 2 Score 0 0     Review of Systems  Constitutional: Negative.   HENT: Negative.   Eyes: Negative.   Respiratory: Negative.   Cardiovascular: Negative.   Gastrointestinal: Negative.   Endocrine: Negative.   Genitourinary: Negative.   Musculoskeletal: Negative.   Skin: Negative.   Allergic/Immunologic: Negative.   Neurological: Negative.   Hematological: Negative.   Psychiatric/Behavioral: Negative.   All other systems reviewed and are negative.      Objective:   Physical Exam  Constitutional: He is oriented to person, place, and time. He appears well-developed and well-nourished.  HENT:  Head: Normocephalic and atraumatic.  Eyes: Conjunctivae and EOM are normal. Pupils are equal, round, and reactive to light.  Neurological: He is alert and oriented to  person, place, and time. Coordination and gait abnormal.  3 minus strength in the left hip flexor, knee extensor, 0 at the ankle dorsiflexor  Psychiatric: He has a normal mood and affect. His speech is delayed. He is slowed.  Nursing note and vitals reviewed.   Trace Left biceps and triceps trace finger flexion, 0 delt [place] 3 FF 1/2 FB subluxation Left AFO RW     Assessment & Plan:  1. Right basal ganglia hemorrhage with residual left hemiparesis, left neglect. He is starting to develop increasing tone in left finger flexors.  Continue outpatient OT, continue outpatient speech Continue home access program and ambulation with wife for  PT  Discussed Botox , recommend 100 units to balance spasticity reduction with avoiding increased weakness Recommend FDS, 25 units. FDP 25 units. FPL 25 units. FCR 25 units

## 2016-06-30 NOTE — Therapy (Signed)
[facility] [address] [place], [place], [address] Phone: [phone]   Fax:  [phone]  Speech Language Pathology Treatment  Patient Details  Name: Jon [name] MRN: [id number] Date of Birth: [birth year] Referring Provider: Dr. Alysia Penna   Encounter Date: 06/30/2016      End of Session - 06/30/16 1503    Visit Number 15   Number of Visits 25   Date for SLP Re-Evaluation 08/04/16   Authorization Type Pt has 20 visits ST for 2017, I recommend he continue skilled ST - just make sure he does not exceed 20 in 2017. It will start over next year and will cont ST   Authorization - Visit Number 15   Authorization - Number of Visits 20   SLP Start Time 6803   SLP Stop Time  1448   SLP Time Calculation (min) 46 min   Activity Tolerance Patient tolerated treatment well      Past Medical History:  Diagnosis Date  . Hypertension   . Liver damage   . Stroke [facility])     Past Surgical History:  Procedure Laterality Date  . TONSILLECTOMY AND ADENOIDECTOMY Bilateral    Age 60    There were no vitals filed for this visit.      Subjective Assessment - 06/30/16 1410    Subjective "I did it quick last night"   Patient is accompained by: Family member   Special Tests [name]               ADULT SLP TREATMENT - 06/30/16 1411      General Information   Behavior/Cognition Alert;Cooperative;Pleasant mood     Treatment Provided   Treatment provided Cognitive-Linquistic     Cognitive-Linquistic Treatment   Treatment focused on Cognition   Skilled Treatment Re-assessed pt with [facility] Cognitive Assessment (MoCA 2) - pt scored 28/30 with 26/30 being [facility]. He demonstrated reduced error awareness in series 7 subtractions, reduced abstract reasoning, and reduced memory, recalling 4/5 words with a delay. Overall processing remains slow which affected letter identification in  attention task. Divided attention  impaired on check writing task - when conversation was instroduced.      Assessment / Recommendations / Plan   Plan Goals updated     Progression Toward Goals   Progression toward goals Goals met and updated          SLP Education - 06/30/16 1500    Education provided Yes   Education Details Progress, new goals   Person(s) Educated Patient;Spouse   Methods Explanation;Verbal cues   Comprehension Verbalized understanding          SLP Short Term Goals - 06/30/16 1500      SLP SHORT TERM GOAL #1   Title Pt will selectively attend to details on mildly complex cognitive linguistic task with distractions and 85% accuracy.   Status Achieved     SLP SHORT TERM GOAL #2   Title Pt will perform mildy complex organization, reasoning and time/money problems with 85% accuracy and rare min A   Status Achieved     SLP SHORT TERM GOAL #3   Title Pt  will utilize external memory aids/book to recall tasks, errands, lists, and reduce repetitive question asking/frustration over 3 sessions with occasional min A.   Time 1   Period Weeks   Status Achieved     SLP SHORT TERM GOAL #4   Title Pt will face and maintain eye contact conversation partner on his  left side with rare min A for 5 minutes conversation.    Status Not Met     SLP SHORT TERM GOAL #5   Title pt will complete HEP with occasional min A (for dysarthria)   Status Achieved          SLP Long Term Goals - 06/30/16 1501      SLP LONG TERM GOAL #1   Title Pt will alterate attention between 2 mildly complex cognitive linguistic tasks with 85% on each and occasional min A   Time 4   Period Weeks   Status Achieved     SLP LONG TERM GOAL #2   Title Pt will solve moderately complex reasoning, functional math and organization problems with 90% accuracy and occasional min A   Time 4   Period Weeks   Status Achieved     SLP LONG TERM GOAL #3   Title Pt will utilize external aids for medication management with rare min A  over 3 sessions.   Time 4   Period Weeks   Status Achieved     SLP LONG TERM GOAL #4   Title pt will complete HEP for dysarthria with rare min A over two sessions   Baseline pt intelligible   Time 4   Period Weeks   Status Deferred     SLP LONG TERM GOAL #5   Title Pt will complete complex reasoning, problem solving and functional math with 85% accuracy and rare min A   Time 4   Period Weeks   Status New     Additional Long Term Goals   Additional Long Term Goals Yes     SLP LONG TERM GOAL #6   Title Pt will divide attention between 2 moderately complex cognitive linguistic tasks with mildly extended time, occasiona min A and 85% accuracy on each.    Time 4   Period Weeks   Status New          Plan - 06/30/16 1506    Clinical Impression Statement Pt has met LTG and been re-assessed today. He continues to exhibit slow processing, reduced divided attention and complex reasoning, problem solving. He also demonstrates mild impairment in error awareness. As pt has made good progress and continues to be motivated to improve his cognition, I recommend he continue skilled ST to maximize cognition for improved indpendence and to reduce caregiver burden. Goals updated, re cert requested. Pt has 20 visit limit for ST in 2017, which will start over in 2018 to continue ST.    Speech Therapy Frequency 2x / week   Duration --  5 weeks or total of 25 visits   Treatment/Interventions Compensatory strategies;Functional tasks;Patient/family education;Cueing hierarchy;Cognitive reorganization;Internal/external aids;SLP instruction and feedback   Potential to Achieve Goals Good   Consulted and Agree with Plan of Care Patient;Family member/caregiver      Patient will benefit from skilled therapeutic intervention in order to improve the following deficits and impairments:   Cognitive communication deficit - Plan: SLP plan of care cert/re-cert    Problem List Patient Active Problem List    Diagnosis Date Noted  . Neuropathic pain of left shoulder 05/19/2016  . Alcohol use disorder, severe, dependence ([place]) 02/15/2016  . Muscle contusion 02/11/2016  . Left flaccid hemiparesis   . Muscle spasticity   . Poor appetite   . Hyperlipidemia 01/14/2016  . Depression 01/14/2016  . Obesity 01/14/2016  . Hyperglycemia 01/14/2016  . Urinary tract infection, site not specified 01/14/2016  . Basal  ganglia hemorrhage ([place]) 01/14/2016  . Hemiparesis affecting nondominant side as late effect of cerebrovascular accident ([place])   . Cognitive deficit, post-stroke   . Left hemiparesis ([place])   . Essential hypertension   . Dysphagia, post-stroke   . Gait disturbance, post-stroke   . Hyponatremia   . Thrombocytopenia ([place])   . Hemorrhagic stroke ([place]) R basal ganglia d/t HTN 01/08/2016    Jeyren Danowski, Annye Rusk MS, CCC-SLP 06/30/2016, 3:12 PM  [facility] [address] [place], [place], [address] Phone: [phone]   Fax:  [phone]   Name: Jon [name] MRN: [id number] Date of Birth: 1956-06-30

## 2016-07-01 ENCOUNTER — Other Ambulatory Visit: Payer: Self-pay | Admitting: Physical Medicine & Rehabilitation

## 2016-07-01 ENCOUNTER — Other Ambulatory Visit: Payer: Self-pay

## 2016-07-01 MED ORDER — BACLOFEN 20 MG PO TABS: 20.0000 mg | ORAL_TABLET | Freq: Four times a day (QID) | ORAL | 1 refills | Status: DC

## 2016-07-05 ENCOUNTER — Ambulatory Visit: Payer: 59 | Admitting: Occupational Therapy

## 2016-07-05 ENCOUNTER — Ambulatory Visit: Payer: Self-pay

## 2016-07-05 ENCOUNTER — Ambulatory Visit: Payer: 59 | Admitting: *Deleted

## 2016-07-05 DIAGNOSIS — I69352 Hemiplegia and hemiparesis following cerebral infarction affecting left dominant side: Secondary | ICD-10-CM

## 2016-07-05 DIAGNOSIS — I69318 Other symptoms and signs involving cognitive functions following cerebral infarction: Secondary | ICD-10-CM

## 2016-07-05 DIAGNOSIS — M6281 Muscle weakness (generalized): Secondary | ICD-10-CM

## 2016-07-05 DIAGNOSIS — R278 Other lack of coordination: Secondary | ICD-10-CM

## 2016-07-05 DIAGNOSIS — R41841 Cognitive communication deficit: Secondary | ICD-10-CM

## 2016-07-05 NOTE — Therapy (Signed)
[facility] [address] [place], [place], [address] Phone: [phone]   Fax:  [phone]  Speech Language Pathology Treatment  Patient Details  Name: Jon [name] MRN: [id number] Date of Birth: 22-Jul-1956 Referring Provider: Dr. Alysia Penna   Encounter Date: 07/05/2016      End of Session - 07/05/16 1031    Visit Number 16   Number of Visits 20 in 2017   Date for SLP Re-Evaluation 08/04/16   Authorization Type Pt has 20 visits ST for 2017, I recommend he continue skilled ST - just make sure he does not exceed 20 in 2017. It will start over next year and will cont ST   Authorization - Visit Number 16   Authorization - Number of Visits 20   SLP Start Time 0973   SLP Stop Time  1020   SLP Time Calculation (min) 45 min   Activity Tolerance Patient tolerated treatment well      Past Medical History:  Diagnosis Date  . Hypertension   . Liver damage   . Stroke [facility])     Past Surgical History:  Procedure Laterality Date  . TONSILLECTOMY AND ADENOIDECTOMY Bilateral    Age 60    There were no vitals filed for this visit.      Subjective Assessment - 07/05/16 1025    Subjective "I need to get a keyboard. I'd like to have a steinway grand" "Everything is a challenge"               ADULT SLP TREATMENT - 07/05/16 0001      General Information   Behavior/Cognition Alert;Cooperative  flat affect     Treatment Provided   Treatment provided Cognitive-Linquistic     Pain Assessment   Pain Assessment No/denies pain     Cognitive-Linquistic Treatment   Treatment focused on Cognition   Skilled Treatment Pt seen for skilled ST session targeting functional problem solving, sequencing, and thought organization. Pt reports assisting with med box, but wife continues to fill the box and bring him his medication on time. Pt was encouraged to begin assisting her with placing pills in the organizer, and  being responsible for requesting meds, rather than waiting for [name] to bring them to him. Pt able to verbally problem solve steps required to carry objects (plate of food, silverware, etc) when seated in wheelchair. Discussed steps needed to complete Christmas to-do's.  Pt able to verbalize rationale for the way he does things and why he doesn't do it another way (re: safety, efficiency).  Eye contact during conversation 50% without cues.      Assessment / Recommendations / Plan   Plan Continue with current plan of care     Progression Toward Goals   Progression toward goals Progressing toward goals          SLP Education - 07/05/16 1022    Education provided Yes   Education Details increasing independence with self med program, scales with right hand, using calendar/sticky notes to improve fxn'l recall   Person(s) Educated Patient;Spouse   Methods Explanation;Demonstration;Handout   Comprehension Verbalized understanding          SLP Short Term Goals - 07/05/16 1037      SLP SHORT TERM GOAL #1   Title Pt will selectively attend to details on mildly complex cognitive linguistic task with distractions and 85% accuracy.   Status Achieved     SLP SHORT TERM GOAL #2   Title Pt will  perform mildy complex organization, reasoning and time/money problems with 85% accuracy and rare min A   Status Achieved     SLP SHORT TERM GOAL #3   Title Pt  will utilize external memory aids/book to recall tasks, errands, lists, and reduce repetitive question asking/frustration over 3 sessions with occasional min A.   Status Achieved     SLP SHORT TERM GOAL #4   Title Pt will face and maintain eye contact conversation partner on his left side with rare min A for 5 minutes conversation.    Status Not Met     SLP SHORT TERM GOAL #5   Title pt will complete HEP with occasional min A (for dysarthria)   Status Achieved          SLP Long Term Goals - 07/05/16 1038      SLP LONG TERM GOAL #1    Title Pt will alterate attention between 2 mildly complex cognitive linguistic tasks with 85% on each and occasional min A   Status Achieved     SLP LONG TERM GOAL #2   Title Pt will solve moderately complex reasoning, functional math and organization problems with 90% accuracy and occasional min A   Status Achieved     SLP LONG TERM GOAL #3   Title Pt will utilize external aids for medication management with rare min A over 3 sessions.   Status Achieved     SLP LONG TERM GOAL #4   Title pt will complete HEP for dysarthria with rare min A over two sessions   Status Deferred     SLP LONG TERM GOAL #5   Title Pt will complete complex reasoning, problem solving and functional math with 85% accuracy and rare min A   Time 3   Period Weeks   Status On-going     SLP LONG TERM GOAL #6   Title Pt will divide attention between 2 moderately complex cognitive linguistic tasks with mildly extended time, occasiona min A and 85% accuracy on each.    Time 3   Period Weeks   Status On-going          Plan - 07/05/16 1032    Clinical Impression Statement Pt continues to make progress in therapy, and is motivated to improve. Verbal problem solving and reasoning through functional tasks (making to do lists, effectively carrying items)  was thorough and well thought out. Pt reports using calendar or phone to facilitate functional recall. Pt was encouraged to increase independence with self medication. He verbalizes a desire to acquire a keyboard to improve dexterity of the right hand, which he indicates is challenging. Pt does demonstrate functional signature with non-dominant (right) hand. Recommend continued skilled ST focusing on higher level cognitive goals for increased safe independence and reduced caregiver burden.   Speech Therapy Frequency 2x / week   Duration --  4 more visits in 2017   Treatment/Interventions Compensatory strategies;Functional tasks;Patient/family education;Cueing  hierarchy;Cognitive reorganization;Internal/external aids;SLP instruction and feedback   Potential to Achieve Goals Good   Potential Considerations Previous level of function;Cooperation/participation level;Family/community support;Ability to learn/carryover information   SLP Home Exercise Plan increase independence with self med   Consulted and Agree with Plan of Care Patient;Family member/caregiver   Family Member Consulted spouse, [name]      Patient will benefit from skilled therapeutic intervention in order to improve the following deficits and impairments:   Cognitive communication deficit    Problem List Patient Active Problem List   Diagnosis Date Noted  .  Neuropathic pain of left shoulder 05/19/2016  . Alcohol use disorder, severe, dependence ([place]) 02/15/2016  . Muscle contusion 02/11/2016  . Left flaccid hemiparesis   . Muscle spasticity   . Poor appetite   . Hyperlipidemia 01/14/2016  . Depression 01/14/2016  . Obesity 01/14/2016  . Hyperglycemia 01/14/2016  . Urinary tract infection, site not specified 01/14/2016  . Basal ganglia hemorrhage ([place]) 01/14/2016  . Hemiparesis affecting nondominant side as late effect of cerebrovascular accident ([place])   . Cognitive deficit, post-stroke   . Left hemiparesis ([place])   . Essential hypertension   . Dysphagia, post-stroke   . Gait disturbance, post-stroke   . Hyponatremia   . Thrombocytopenia ([place])   . Hemorrhagic stroke ([place]) R basal ganglia d/t HTN 01/08/2016   Tailor Westfall B. [place], MSP, CCC-SLP  Shonna Chock 07/05/2016, 10:40 AM  [facility] [address] [place], [place], [address] Phone: [phone]   Fax:  [phone]   Name: Jon [name] MRN: [id number] Date of Birth: 28-Oct-1955

## 2016-07-05 NOTE — Patient Instructions (Signed)
(  Exercise) Monday Tuesday Wednesday Thursday Friday Saturday Sunday                                                                                                             

## 2016-07-05 NOTE — Therapy (Signed)
[facility] [address] [place], [place], [address] Phone: [phone]   Fax:  [phone]  Occupational Therapy Treatment  Patient Details  Name: Jon [name] MRN: [id number] Date of Birth: 1956/01/20 Referring Provider: Dr Alysia Penna  Encounter Date: 07/05/2016      OT End of Session - 07/05/16 1029    Visit Number 19   Number of Visits 22   Date for OT Re-Evaluation 07/31/16   Authorization Type [employer] [facility])    Authorization Time Period renewal completed 05/31/16   Authorization - Visit Number 17   Authorization - Number of Visits 20   OT Start Time 1025   OT Stop Time 1105   OT Time Calculation (min) 40 min   Activity Tolerance Patient tolerated treatment well   Behavior During Therapy [facility] for tasks assessed/performed      Past Medical History:  Diagnosis Date  . Hypertension   . Liver damage   . Stroke [facility])     Past Surgical History:  Procedure Laterality Date  . TONSILLECTOMY AND ADENOIDECTOMY Bilateral    Age 18    There were no vitals filed for this visit.      Subjective Assessment - 07/05/16 1605    Subjective  Pt denies pain   Patient Stated Goals Get my left arm going again and have more control of my left foot.   Currently in Pain? No/denies          Treatment:P/ROM to wrist digits and forearm of LUE. Pt returned demonstration of  Self ROM to left wrist. NMES 50 pps 250 pw 10 secs cycle to wrist and finger extensors, intensity 35 x 13 mins with pt or therapist  facilitating wrist extension during on cycle. AA/ROM shoulder flexion and elbow extension with UE ranger with mod/ max facilitation.  Therapist  provided pt/ wife with an exercise flowsheet and encouraged them to use regularly to increase the frequency of exercises. Discussion with pt/ and wife regarding therapist recommendation that pt is not left at home unsupervised for saftey reasons, as pt may not  be able to exit / react in an emergency situation.                      OT Short Term Goals - 06/21/16 1029      OT SHORT TERM GOAL #1   Title Pt will I state precautions/signs and symptoms of CVA   Baseline VC's   Time 4   Period Weeks   Status Achieved     OT SHORT TERM GOAL #2   Title Pt will demonstrate Mod I HEP for LUE self ROM    Baseline VC's and tc's   Time 4   Period Weeks   Status Partially Met  Pt requires v.c./ assist from wife     OT SHORT TERM GOAL #3   Title Pt will I state 2-3 edema control techniques LUE    Baseline VC's/Min A   Status Deferred     OT SHORT TERM GOAL #4   Title Pt will use LUE as a stabilizer 10% of the time for ADLS/ IADLs.   Time 4   Period Weeks   Status Not Met  not using consistently     OT SHORT TERM GOAL #5   Title Pt will attend to left hemibody space with no more than min v.c. 75% of the time.   Time 4   Period Weeks  Status On-going  not consistent     OT SHORT TERM GOAL #6   Title Pt will verbalize understanding of proper LUE positioning to minimze risk for injury/ contracture.   Time 4   Period Weeks   Status On-going  needs reinforcement     OT SHORT TERM GOAL #7   Title Pt will demonstrate ability to grasp /release a cylindrical cone 2/3 trials with LUe in prep for functional use of LUE.   Time 4   Period Weeks   Status Not Met  pt is able to grasp cone, yet not release consistently           OT Long Term Goals - 07/05/16 1031      OT LONG TERM GOAL #3   Title Pt will be Mod I basic ADL transfers using hemi walker    Time 8   Period Weeks   Status On-going               Plan - 07/05/16 1606    Clinical Impression Statement Pt is progressing towards goals slowly, anticipate discharge next visit.   Rehab Potential Fair   OT Frequency 1x / week   OT Duration 8 weeks   Plan check goals anticipate d/c vs placing therapy on hold until beginning of February as pt is having botox  in January.   Consulted and Agree with Plan of Care Patient;Family member/caregiver      Patient will benefit from skilled therapeutic intervention in order to improve the following deficits and impairments:  Abnormal gait, Decreased balance, Decreased endurance, Difficulty walking, Impaired sensation, Impaired vision/preception, Pain, Increased edema, Decreased range of motion, Decreased cognition, Decreased activity tolerance, Decreased coordination, Decreased knowledge of use of DME, Decreased safety awareness, Decreased strength, Impaired UE functional use  Visit Diagnosis: Hemiplegia and hemiparesis following cerebral infarction affecting left dominant side (HCC)  Other lack of coordination  Muscle weakness (generalized)  Other symptoms and signs involving cognitive functions following cerebral infarction    Problem List Patient Active Problem List   Diagnosis Date Noted  . Neuropathic pain of left shoulder 05/19/2016  . Alcohol use disorder, severe, dependence ([place]) 02/15/2016  . Muscle contusion 02/11/2016  . Left flaccid hemiparesis   . Muscle spasticity   . Poor appetite   . Hyperlipidemia 01/14/2016  . Depression 01/14/2016  . Obesity 01/14/2016  . Hyperglycemia 01/14/2016  . Urinary tract infection, site not specified 01/14/2016  . Basal ganglia hemorrhage ([place]) 01/14/2016  . Hemiparesis affecting nondominant side as late effect of cerebrovascular accident ([place])   . Cognitive deficit, post-stroke   . Left hemiparesis ([place])   . Essential hypertension   . Dysphagia, post-stroke   . Gait disturbance, post-stroke   . Hyponatremia   . Thrombocytopenia ([place])   . Hemorrhagic stroke ([place]) R basal ganglia d/t HTN 01/08/2016    Dorea Duff 07/05/2016, 4:10 PM  [facility] [address] [place], [place], [address] Phone: [phone]   Fax:  [phone]  Name: DENARIUS SESLER MRN: [id number] Date of  Birth: 1956/05/20

## 2016-07-07 ENCOUNTER — Ambulatory Visit: Payer: 59 | Admitting: *Deleted

## 2016-07-07 ENCOUNTER — Encounter: Payer: Self-pay | Admitting: Neurology

## 2016-07-07 ENCOUNTER — Ambulatory Visit (INDEPENDENT_AMBULATORY_CARE_PROVIDER_SITE_OTHER): Payer: 59 | Admitting: Neurology

## 2016-07-07 VITALS — BP 101/60 | HR 59 | Wt 150.8 lb

## 2016-07-07 DIAGNOSIS — I610 Nontraumatic intracerebral hemorrhage in hemisphere, subcortical: Secondary | ICD-10-CM | POA: Diagnosis not present

## 2016-07-07 DIAGNOSIS — I69359 Hemiplegia and hemiparesis following cerebral infarction affecting unspecified side: Secondary | ICD-10-CM

## 2016-07-07 DIAGNOSIS — F3289 Other specified depressive episodes: Secondary | ICD-10-CM | POA: Diagnosis not present

## 2016-07-07 DIAGNOSIS — I10 Essential (primary) hypertension: Secondary | ICD-10-CM

## 2016-07-07 DIAGNOSIS — I69352 Hemiplegia and hemiparesis following cerebral infarction affecting left dominant side: Secondary | ICD-10-CM | POA: Diagnosis not present

## 2016-07-07 DIAGNOSIS — E785 Hyperlipidemia, unspecified: Secondary | ICD-10-CM

## 2016-07-07 DIAGNOSIS — R41841 Cognitive communication deficit: Secondary | ICD-10-CM

## 2016-07-07 DIAGNOSIS — R471 Dysarthria and anarthria: Secondary | ICD-10-CM

## 2016-07-07 NOTE — Progress Notes (Signed)
STROKE NEUROLOGY FOLLOW UP NOTE  NAME: Jon CoolerGeorge D Vigeant DOB: 07-05-1956  REASON FOR VISIT: stroke follow up HISTORY FROM: pt and chart  Today we had the pleasure of seeing Jon [name] in follow-up at our Neurology Clinic. Pt was accompanied by wife.   History Summary [name] Donahoo is a 60 y.o. male with history of HTN, HLD, and alcohol abuse admitted no 01/08/16 for severe headache, left-sided weakness, slurred speech, and elevated blood pressure. CT showed a R basal ganglia hemorrhage likely due to HTN. CTA head and neck showed no AVM or aneurysm. EF 60-65%. LDL 157 and A1C 5.5. Resumed home metoprolol but added amlodipine for BP control. Also continued home pravastatin for HLD. Added zoloft for depression. He was discharged to CIR after stabilization but still has left hemiplegia.   04/11/16 follow up - the patient has been doing well. Released from CIR and now at home undergoing outpt PT/OT/speech. Still has left sided weakness, but left leg able to raise up but left arm still not able to move. BP at home 115/70, today in clinic 95/62. Reported occular migraine with rainbow colored imaging at right eye lasting 15min. No associated HA. It happened 2 times for the last 3 months.   Interval History During the interval time, pt has been doing well. He has lost 50lbs since the stroke, and no he has no more snore or apnea during sleep as per wife. However, he stated that when he sleeps at night, about half wake and half sleep, he sometimes see spider at the ceiling when fully awake, there is no spider. He still has left sided hemiparesis, follows with Dr. Wynn BankerKirsteins considering botox injection. BP 101/60 today, at home his BP also low, will decrease metoprolol dose.   REVIEW OF SYSTEMS: Full 14 system review of systems performed and notable only for those listed below and in HPI above, all others are negative:  Constitutional:   Cardiovascular:  Ear/Nose/Throat:   Skin:  Eyes:  Blurry  vision Respiratory:   Gastroitestinal:   Genitourinary: difficulty urinating Hematology/Lymphatic:   Endocrine:  Musculoskeletal:  Joint pain Allergy/Immunology:   Neurological:  weakness Psychiatric: hallucinations Sleep:   The following represents the patient's updated allergies and side effects list: Allergies  Allergen Reactions  . Gluten Meal Nausea And Vomiting  . Lactose Intolerance (Gi) Nausea And Vomiting  . Other     Tree nuts   . Shellfish Allergy Swelling and Rash    The neurologically relevant items on the patient's problem list were reviewed on today's visit.  Neurologic Examination  A problem focused neurological exam (12 or more points of the single system neurologic examination, vital signs counts as 1 point, cranial nerves count for 8 points) was performed.  Blood pressure 101/60, pulse (!) 59, weight 150 lb 12.8 oz (68.4 kg).  General - Well nourished, well developed, in no apparent distress.  Ophthalmologic - Fundi not visualized due to eye movement.  Cardiovascular - Regular rate and rhythm with no murmur.  Mental Status -  Level of arousal and orientation to time, place, and person were intact. Language including expression, naming, repetition, comprehension was assessed and found intact, mild dysarthria. Fund of Knowledge was assessed and was intact.  Cranial Nerves II - XII - II - Visual field intact OU. III, IV, VI - Extraocular movements intact. V - Facial sensation intact bilaterally. VII - left facial droop VIII - Hearing & vestibular intact bilaterally. X - Palate elevates symmetrically. XI -  Chin turning & shoulder shrug intact bilaterally. XII - Tongue protrusion intact.  Motor Strength - The patient's strength was LUE 3-/5 proximal and 0/5 distal, LLE 4/5 proximal and 0/5 distal, 5/5 RUE and RLE.  Bulk was normal and fasciculations were absent.   Motor Tone - Muscle tone was assessed at the neck and appendages and was increased at  LLE and LUE.  Reflexes - The patient's reflexes were 1+ in all extremities except 3+ LLE and he had left babainski with unsustained ankle clonus on the left.  Sensory - Light touch, temperature/pinprick were assessed and were symmetric.    Coordination - The patient had normal movements in the right hand with no ataxia or dysmetria.  Tremor was absent.  Gait and Station - walk with walker, spastic hemiparetic gait on the left.   Data reviewed: I personally reviewed the images and agree with the radiology interpretations.  Ct Head Wo Contrast 01/08/2016  Right basal ganglia hemorrhage with 31 cc volume. 3 mm midline shift.   CTA head and neck  01/09/2016 Unchanged RIGHT basal ganglia hemorrhage, favored to represent a hypertensive related bleed.  Volume today 32 mm, essentially unchanged. Mild 3 mm RIGHT-to-LEFT shift. No intracranial or extracranial stenosis, dissection, aneurysm, or vascular malformation.  TTE  01/09/2016 - Left ventricle: The cavity size was normal. Wall thickness was increased in a pattern of moderate LVH. Systolic function was normal. The estimated ejection fraction was in the range of 60% to 65%. Wall motion was normal; there were no regional wall motion abnormalities. Left ventricular diastolic function parameters were normal. - Pulmonary arteries: PA peak pressure: 52 mm Hg (S). Impressions: No cardiac source of emboli was indentified.  CXR 01/09/2016 No active disease.  MRA head 04/27/16 Overall unremarkable MRA head (without). Again noted is chronic right putamen intracerebral hemorrhage demonstrates expected evolutional change and decrease in size. No definite associated or underlying vascular malformations or aneurysmal dilations.   MRI brain with and without contrast 05/25/16 This MRI of the brain with and without contrast shows the sequela of a hemorrhagic stroke that was acute on the 01/08/2016 CT scan involving the right basal ganglia and  adjacent brain. There is subtle enhancement in the periventricular region of the chronic stroke. Enhancement can persist for several months after a hemorrhage. There are no acute findings on the current study.   Component     Latest Ref Rng & Units 01/09/2016  Cholesterol     0 - 200 mg/dL 782250 (H)  Triglycerides     <150 mg/dL 956212 (H)  HDL Cholesterol     >40 mg/dL 51  Total CHOL/HDL Ratio     RATIO 4.9  VLDL     0 - 40 mg/dL 42 (H)  LDL (calc)     0 - 99 mg/dL 213157 (H)  Hemoglobin Y8MA1C     4.8 - 5.6 % 5.5  Mean Plasma Glucose     mg/dL 578111  TSH     4.6960.350 - 2.9524.500 uIU/mL 0.835  Vitamin B12     180 - 914 pg/mL 449  RPR     Non Reactive Non Reactive  HIV     Non Reactive Non Reactive    Assessment: As you may recall, he is a 60 y.o. Caucasian male with PMH of HTN, HLD, and alcohol abuse admitted no 01/08/16 for R basal ganglia hemorrhage likely due to HTN. CTA head and neck showed no AVM or aneurysm. EF 60-65%. LDL 157 and A1C 5.5. Added  zoloft for depression. He was discharged to CIR after stabilization but still has left hemiplegia. Now at home undergoing outpt PT/OT/speech. Reported infrequent occular migraine with rainbow colored imaging at right eye lasting . No associated HA. Repeat MRI and MRA showed no tumor or aneurysm or AVM. Put on ASA 81mg . Follows with Dr. Wynn Banker for botox injection. Able to walk with walker now. BP on the low side, decreased metoprolol dose.  Plan:  - continue ASA and pravastatin for stroke prevention   - continue aggressive PT - Follow up with your primary care physician for stroke risk factor modification. Recommend maintain blood pressure goal <140/80, diabetes with hemoglobin A1c goal below 7.0% and lipids with LDL cholesterol goal below 70 mg/dL.  - decrease metoprolol to 50mg  bid and monitoring BP - check BP at home daily at home and record and bring over to PCP for medication adjustment if needed - follow up with Dr. Wynn Banker for rehab  and botox injection - continue zoloft - follow up in 6 months.   No orders of the defined types were placed in this encounter.   Meds ordered this encounter  Medications  . metoprolol (LOPRESSOR) 100 MG tablet    Sig: Take 0.5 tablets (50 mg total) by mouth 2 (two) times daily.    Patient Instructions  - continue ASA and pravastatin for stroke prevention   - continue aggressive physical therapy - Follow up with your primary care physician for stroke risk factor modification. Recommend maintain blood pressure goal <140/80, diabetes with hemoglobin A1c goal below 7.0% and lipids with LDL cholesterol goal below 70 mg/dL.  - decrease metoprolol to 50mg  twice a day and monitoring BP - check BP at home daily at home and record and bring over to PCP for medication adjustment if needed - follow up with Dr. Wynn Banker for rehab and botox injection - continue zoloft - follow up in 6 months.    Marvel Plan, MD PhD [facility] [address], [address] [place], [place] [address] [phone]

## 2016-07-07 NOTE — Therapy (Signed)
[facility] [address] [place], [place], [address] Phone: [phone]   Fax:  [phone]  Speech Language Pathology Treatment  Patient Details  Name: Jon [name] MRN: [id number] Date of Birth: 1955/11/15 Referring Provider: Dr. Alysia Penna   Encounter Date: 07/07/2016      End of Session - 07/07/16 1319    Visit Number 17   Number of Visits 25   Date for SLP Re-Evaluation 08/04/16   Authorization Type Pt has 20 visits ST for 2017, I recommend he continue skilled ST - just make sure he does not exceed 20 in 2017. It will start over next year and will cont ST   Authorization - Visit Number 17   Authorization - Number of Visits 20   SLP Start Time 4656   SLP Stop Time  1100   SLP Time Calculation (min) 45 min   Activity Tolerance Patient tolerated treatment well      Past Medical History:  Diagnosis Date  . Hypertension   . Liver damage   . Stroke [facility])     Past Surgical History:  Procedure Laterality Date  . TONSILLECTOMY AND ADENOIDECTOMY Bilateral    Age 60    There were no vitals filed for this visit.      Subjective Assessment - 07/07/16 1009    Subjective Improved attention to the left as pt was walking in today - looking at the posters on the wall.   Patient is accompained by: Family member   Special Tests [name]   Currently in Pain? No/denies               ADULT SLP TREATMENT - 07/07/16 0001      General Information   Behavior/Cognition Alert;Cooperative;Pleasant mood     Treatment Provided   Treatment provided Cognitive-Linquistic     Pain Assessment   Pain Assessment No/denies pain     Cognitive-Linquistic Treatment   Treatment focused on Cognition   Skilled Treatment Pt seen for skilled ST session targeting higher level cognitive goals. Walking to the office, pt was noted to attend to posters on the left wall without cues given. Appropriate eye contact throughout  the session was observed 65% of the time. Pt was able to divide attention between completion of deductive reasoning puzzle #1 and conversation between pt, wife, and SLP without need for redirection of attention to task. Cue given x1 during reasoning task. Pt was able to interject appropriately into the conversation while working on this task. Wife asked about increasing time spent alone, reporting pt has demonstrated safety alone for 2.5 hours thus far. Pt provided suggestions for what needed to be arranged or taken care of in order for him to be able to be home alone for a period of time, including bathroom needs, dressed with shoes on, clear doorway paths, established way to get out of the house (which he reports he has practiced several times), food/drink available if needed.     Assessment / Recommendations / Plan   Plan Continue with current plan of care     Progression Toward Goals   Progression toward goals Progressing toward goals          SLP Education - 07/07/16 1318    Education provided Yes   Education Details verbally problem solving safety at home   Person(s) Educated Patient;Spouse   Methods Explanation;Demonstration;Handout   Comprehension Verbalized understanding;Returned demonstration          SLP Short Term Goals -  07/05/16 1037      SLP SHORT TERM GOAL #1   Title Pt will selectively attend to details on mildly complex cognitive linguistic task with distractions and 85% accuracy.   Status Achieved     SLP SHORT TERM GOAL #2   Title Pt will perform mildy complex organization, reasoning and time/money problems with 85% accuracy and rare min A   Status Achieved     SLP SHORT TERM GOAL #3   Title Pt  will utilize external memory aids/book to recall tasks, errands, lists, and reduce repetitive question asking/frustration over 3 sessions with occasional min A.   Status Achieved     SLP SHORT TERM GOAL #4   Title Pt will face and maintain eye contact conversation  partner on his left side with rare min A for 5 minutes conversation.    Status Not Met     SLP SHORT TERM GOAL #5   Title pt will complete HEP with occasional min A (for dysarthria)   Status Achieved          SLP Long Term Goals - 07/07/16 1325      SLP LONG TERM GOAL #5   Title Pt will complete complex reasoning, problem solving and functional math with 85% accuracy and rare min A   Time 3   Period Weeks   Status On-going     SLP LONG TERM GOAL #6   Title Pt will divide attention between 2 moderately complex cognitive linguistic tasks with mildly extended time, occasional min A and 85% accuracy on each.    Time 3   Period Weeks   Status On-going          Plan - 07/07/16 1320    Clinical Impression Statement Pt with improvement in attention to the left and eye contact noted this session. Divided attention task involving completion of multiprocess reasoning task and engaging in conversation was successful. Pt's wife asked about leaving pt unattended for longer than 2 hours. Pt able to verbally provide information regarding insuring safety. Pt making good progress in therapy. Continued skilled ST intervention is recommended to further higher level cognitive skills and safe independence, and decrease caregiver burden.    Speech Therapy Frequency 2x / week   Duration --  3 more visits in 2017   Treatment/Interventions Compensatory strategies;Functional tasks;Patient/family education;Cueing hierarchy;Cognitive reorganization;Internal/external aids;SLP instruction and feedback   Potential to Achieve Goals Good   Potential Considerations Previous level of function;Cooperation/participation level;Family/community support;Ability to learn/carryover information   SLP Home Exercise Plan safety if left unattended   Consulted and Agree with Plan of Care Patient;Family member/caregiver   Family Member Consulted spouse, [name]      Patient will benefit from skilled therapeutic  intervention in order to improve the following deficits and impairments:   Cognitive communication deficit  Dysarthria and anarthria    Problem List Patient Active Problem List   Diagnosis Date Noted  . Neuropathic pain of left shoulder 05/19/2016  . Alcohol use disorder, severe, dependence ([place]) 02/15/2016  . Muscle contusion 02/11/2016  . Left flaccid hemiparesis   . Muscle spasticity   . Poor appetite   . Hyperlipidemia 01/14/2016  . Depression 01/14/2016  . Obesity 01/14/2016  . Hyperglycemia 01/14/2016  . Urinary tract infection, site not specified 01/14/2016  . Basal ganglia hemorrhage ([place]) 01/14/2016  . Hemiparesis affecting nondominant side as late effect of cerebrovascular accident ([place])   . Cognitive deficit, post-stroke   . Left hemiparesis ([place])   . Essential hypertension   .  Dysphagia, post-stroke   . Gait disturbance, post-stroke   . Hyponatremia   . Thrombocytopenia ([place])   . Hemorrhagic stroke ([place]) R basal ganglia d/t HTN 01/08/2016   Celia B. [place], MSP, CCC-SLP  Shonna Chock 07/07/2016, 1:26 PM  [facility] [address] [place], [place], [address] Phone: [phone]   Fax:  [phone]   Name: MATHAYUS STANBERY MRN: [id number] Date of Birth: 03/12/1956

## 2016-07-07 NOTE — Patient Instructions (Addendum)
-   continue ASA and pravastatin for stroke prevention   - continue aggressive physical therapy - Follow up with your primary care physician for stroke risk factor modification. Recommend maintain blood pressure goal <140/80, diabetes with hemoglobin A1c goal below 7.0% and lipids with LDL cholesterol goal below 70 mg/dL.  - decrease metoprolol to 50mg  twice a day and monitoring BP - check BP at home daily at home and record and bring over to PCP for medication adjustment if needed - follow up with Dr. Wynn BankerKirsteins for rehab and botox injection - continue zoloft - follow up in 6 months.

## 2016-07-08 MED ORDER — METOPROLOL TARTRATE 100 MG PO TABS: 50.0000 mg | ORAL_TABLET | Freq: Two times a day (BID) | ORAL | Status: DC

## 2016-07-11 ENCOUNTER — Ambulatory Visit: Payer: 59 | Admitting: Speech Pathology

## 2016-07-11 ENCOUNTER — Ambulatory Visit: Payer: Self-pay | Admitting: Rehabilitation

## 2016-07-11 ENCOUNTER — Encounter: Payer: Self-pay | Admitting: Occupational Therapy

## 2016-07-11 DIAGNOSIS — R471 Dysarthria and anarthria: Secondary | ICD-10-CM

## 2016-07-11 DIAGNOSIS — I69352 Hemiplegia and hemiparesis following cerebral infarction affecting left dominant side: Secondary | ICD-10-CM | POA: Diagnosis not present

## 2016-07-11 NOTE — Therapy (Signed)
[facility] [address] [place], [place], [address] Phone: [phone]   Fax:  [phone]  Speech Language Pathology Treatment  Patient Details  Name: Jon [name] MRN: [id number] Date of Birth: [birth year] Referring Provider: Dr. Alysia Penna   Encounter Date: 07/11/2016      End of Session - 07/11/16 0931    Visit Number 18   Number of Visits 25   Date for SLP Re-Evaluation 08/04/16   Authorization Type Pt has 20 visits ST for 2017, I recommend he continue skilled ST - just make sure he does not exceed 20 in 2017. It will start over next year and will cont ST   Authorization - Visit Number 18   Authorization - Number of Visits 46   SLP Start Time 0845   SLP Stop Time  0931   SLP Time Calculation (min) 46 min   Activity Tolerance Patient tolerated treatment well      Past Medical History:  Diagnosis Date  . Hypertension   . Liver damage   . Stroke [facility])     Past Surgical History:  Procedure Laterality Date  . TONSILLECTOMY AND ADENOIDECTOMY Bilateral    Age 60    There were no vitals filed for this visit.      Subjective Assessment - 07/11/16 0901    Subjective Pt will spontaneous eye contact and improved animation of face   Patient is accompained by: Family member   Special Tests [name]   Currently in Pain? No/denies               ADULT SLP TREATMENT - 07/11/16 0902      General Information   Behavior/Cognition Alert;Cooperative;Pleasant mood     Treatment Provided   Treatment provided Cognitive-Linquistic     Pain Assessment   Pain Assessment No/denies pain     Cognitive-Linquistic Treatment   Treatment focused on Cognition   Skilled Treatment Pt with improved prosody, eye contact and animation with conversation.  Mildly complex reasoning/deduction puzzle with usual mod cues to use chart to help deduce clues, also when a clue had 2 pieces of information. Divided attention  between 2 simple tasks of divergent word listing and simple auditory money counting with 95% on each and rare min cues/repeptitions. Overall improvement in pragmatics.     Assessment / Recommendations / Plan   Plan Continue with current plan of care     Progression Toward Goals   Progression toward goals Progressing toward goals          SLP Education - 07/11/16 0928    Education provided Yes   Education Details progress in therapy   Person(s) Educated Patient;Spouse   Methods Explanation;Demonstration;Handout   Comprehension Verbalized understanding;Returned demonstration          SLP Short Term Goals - 07/11/16 0930      SLP SHORT TERM GOAL #1   Title Pt will selectively attend to details on mildly complex cognitive linguistic task with distractions and 85% accuracy.   Status Achieved     SLP SHORT TERM GOAL #2   Title Pt will perform mildy complex organization, reasoning and time/money problems with 85% accuracy and rare min A   Status Achieved     SLP SHORT TERM GOAL #3   Title Pt  will utilize external memory aids/book to recall tasks, errands, lists, and reduce repetitive question asking/frustration over 3 sessions with occasional min A.   Status Achieved     SLP SHORT  TERM GOAL #4   Title Pt will face and maintain eye contact conversation partner on his left side with rare min A for 5 minutes conversation.    Status Not Met     SLP SHORT TERM GOAL #5   Title pt will complete HEP with occasional min A (for dysarthria)   Status Achieved          SLP Long Term Goals - 07/11/16 0930      SLP LONG TERM GOAL #1   Title Pt will alterate attention between 2 mildly complex cognitive linguistic tasks with 85% on each and occasional min A   Time 3   Status Achieved     SLP LONG TERM GOAL #2   Title Pt will solve moderately complex reasoning, functional math and organization problems with 90% accuracy and occasional min A   Status Achieved     SLP LONG TERM GOAL  #3   Title Pt will utilize external aids for medication management with rare min A over 3 sessions.   Status Achieved     SLP LONG TERM GOAL #4   Title pt will complete HEP for dysarthria with rare min A over two sessions   Status Deferred     SLP LONG TERM GOAL #5   Title Pt will complete complex reasoning, problem solving and functional math with 85% accuracy and rare min A   Time 2   Period Weeks   Status On-going     SLP LONG TERM GOAL #6   Title Pt will divide attention between 2 moderately complex cognitive linguistic tasks with mildly extended time, occasiona min A and 85% accuracy on each.    Time 2   Period Weeks   Status On-going          Plan - 07/11/16 1324    Clinical Impression Statement Pt continues to improve with prosody, eye contact and conversation. Divided attention between 2 simple tasks with rare min A, Continue skilled ST to maximize high level cognitive skills for improved independence.    Speech Therapy Frequency 2x / week   Treatment/Interventions Compensatory strategies;Functional tasks;Patient/family education;Cueing hierarchy;Cognitive reorganization;Internal/external aids;SLP instruction and feedback   Potential to Achieve Goals Good   Potential Considerations Previous level of function;Cooperation/participation level;Family/community support;Ability to learn/carryover information   Consulted and Agree with Plan of Care Patient;Family member/caregiver   Family Member Consulted spouse, [name]      Patient will benefit from skilled therapeutic intervention in order to improve the following deficits and impairments:   Dysarthria and anarthria    Problem List Patient Active Problem List   Diagnosis Date Noted  . Neuropathic pain of left shoulder 05/19/2016  . Alcohol use disorder, severe, dependence ([place]) 02/15/2016  . Muscle contusion 02/11/2016  . Left flaccid hemiparesis   . Muscle spasticity   . Poor appetite   . Hyperlipidemia  01/14/2016  . Depression 01/14/2016  . Obesity 01/14/2016  . Hyperglycemia 01/14/2016  . Urinary tract infection, site not specified 01/14/2016  . Basal ganglia hemorrhage ([place]) 01/14/2016  . Hemiparesis affecting nondominant side as late effect of cerebrovascular accident ([place])   . Cognitive deficit, post-stroke   . Left hemiparesis ([place])   . Essential hypertension   . Dysphagia, post-stroke   . Gait disturbance, post-stroke   . Hyponatremia   . Thrombocytopenia ([place])   . Hemorrhagic stroke ([place]) R basal ganglia d/t HTN 01/08/2016    Breeanne Oblinger, Annye Rusk MS, CCC-SLP 07/11/2016, 9:32 AM  [facility] [address] [place], [place], [address] Phone: [phone]   Fax:  [phone]   Name: Jon [name] MRN: [id number] Date of Birth: 06-22-1956

## 2016-07-14 ENCOUNTER — Encounter: Payer: Self-pay | Admitting: *Deleted

## 2016-07-14 ENCOUNTER — Ambulatory Visit: Payer: 59 | Admitting: Speech Pathology

## 2016-07-14 ENCOUNTER — Ambulatory Visit: Payer: 59 | Admitting: *Deleted

## 2016-07-14 DIAGNOSIS — R471 Dysarthria and anarthria: Secondary | ICD-10-CM

## 2016-07-14 DIAGNOSIS — R41841 Cognitive communication deficit: Secondary | ICD-10-CM

## 2016-07-14 DIAGNOSIS — I69352 Hemiplegia and hemiparesis following cerebral infarction affecting left dominant side: Secondary | ICD-10-CM | POA: Diagnosis not present

## 2016-07-14 DIAGNOSIS — I69318 Other symptoms and signs involving cognitive functions following cerebral infarction: Secondary | ICD-10-CM

## 2016-07-14 DIAGNOSIS — M6281 Muscle weakness (generalized): Secondary | ICD-10-CM

## 2016-07-14 DIAGNOSIS — R278 Other lack of coordination: Secondary | ICD-10-CM

## 2016-07-14 NOTE — Therapy (Signed)
[facility] [address] [place], [place], [address] Phone: [phone]   Fax:  [phone]  Speech Language Pathology Treatment  Patient Details  Name: Jon [name] MRN: [id number] Date of Birth: [birth year] Referring Provider: Dr. Alysia Penna   Encounter Date: 07/14/2016      End of Session - 07/14/16 1205    Visit Number 19   Number of Visits 25   Date for SLP Re-Evaluation 08/04/16   Authorization Type Pt has 20 visits ST for 2017, I recommend he continue skilled ST - just make sure he does not exceed 20 in 2017. It will start over next year and will cont ST   Authorization - Visit Number 19   Authorization - Number of Visits 20   SLP Start Time [id number]   SLP Stop Time  1014   SLP Time Calculation (min) 46 min   Activity Tolerance Patient tolerated treatment well      Past Medical History:  Diagnosis Date  . Hypertension   . Liver damage   . Stroke [facility])     Past Surgical History:  Procedure Laterality Date  . TONSILLECTOMY AND ADENOIDECTOMY Bilateral    Age 60    There were no vitals filed for this visit.      Subjective Assessment - 07/14/16 0937    Subjective "I only did one page - I thought that was all I had to do"   Patient is accompained by: Family member   Special Tests [name]   Currently in Pain? No/denies               ADULT SLP TREATMENT - 07/14/16 0941      General Information   Behavior/Cognition Alert;Cooperative;Pleasant mood     Treatment Provided   Treatment provided Cognitive-Linquistic     Pain Assessment   Pain Assessment No/denies pain     Cognitive-Linquistic Treatment   Treatment focused on Cognition   Skilled Treatment Faciltated moderately complex to complex reasoning/functional math with midlly extended time inconsistently  and rare min A with 90% accuracy.   Pt alternated attention between these reasoning problems, conversation and calculator use.  Pt verbalized concern re: imprecise speech, I trained pt in compensations and HEP for dysarthira - pt has been intellgible in ST.      Assessment / Recommendations / Plan   Plan Continue with current plan of care          SLP Education - 07/14/16 1006    Education provided Yes   Education Details compensations for dysarthria   Person(s) Educated Patient;Spouse   Methods Explanation;Demonstration;Handout   Comprehension Verbalized understanding;Returned demonstration          SLP Short Term Goals - 07/14/16 1205      SLP SHORT TERM GOAL #1   Title Pt will selectively attend to details on mildly complex cognitive linguistic task with distractions and 85% accuracy.   Status Achieved     SLP SHORT TERM GOAL #2   Title Pt will perform mildy complex organization, reasoning and time/money problems with 85% accuracy and rare min A   Status Achieved     SLP SHORT TERM GOAL #3   Title Pt  will utilize external memory aids/book to recall tasks, errands, lists, and reduce repetitive question asking/frustration over 3 sessions with occasional min A.   Status Achieved     SLP SHORT TERM GOAL #4   Title Pt will face and maintain eye contact conversation partner on  his left side with rare min A for 5 minutes conversation.    Status Not Met     SLP SHORT TERM GOAL #5   Title pt will complete HEP with occasional min A (for dysarthria)   Status Achieved          SLP Long Term Goals - 07/14/16 1205      SLP LONG TERM GOAL #1   Title Pt will alterate attention between 2 mildly complex cognitive linguistic tasks with 85% on each and occasional min A   Time 3   Status Achieved     SLP LONG TERM GOAL #2   Title Pt will solve moderately complex reasoning, functional math and organization problems with 90% accuracy and occasional min A   Status Achieved     SLP LONG TERM GOAL #3   Title Pt will utilize external aids for medication management with rare min A over 3 sessions.   Status  Achieved     SLP LONG TERM GOAL #4   Title pt will complete HEP for dysarthria with rare min A over two sessions   Status On-going     SLP LONG TERM GOAL #5   Title Pt will complete complex reasoning, problem solving and functional math with 85% accuracy and rare min A   Time 2   Period Weeks   Status Achieved     SLP LONG TERM GOAL #6   Title Pt will divide attention between 2 moderately complex cognitive linguistic tasks with mildly extended time, occasiona min A and 85% accuracy on each.    Time 2   Period Weeks   Status On-going          Plan - 07/14/16 1204    Clinical Impression Statement Pt with improvement on moderately complex reasoning with minimal extended time and rare min A. Pt verbalized concerns re: imprecise speech - trained pt in HEP for dysarthria with occasional min a. Cotinue skilled ST to maximize high level cognition and motor speech for improved indepence and intelligibility   Speech Therapy Frequency 2x / week   Treatment/Interventions Compensatory strategies;Functional tasks;Patient/family education;Cueing hierarchy;Cognitive reorganization;Internal/external aids;SLP instruction and feedback   Potential to Achieve Goals Good   Potential Considerations Previous level of function;Cooperation/participation level;Family/community support;Ability to learn/carryover information   Consulted and Agree with Plan of Care Patient;Family member/caregiver   Family Member Consulted spouse, [name]      Patient will benefit from skilled therapeutic intervention in order to improve the following deficits and impairments:   Cognitive communication deficit  Dysarthria and anarthria    Problem List Patient Active Problem List   Diagnosis Date Noted  . Neuropathic pain of left shoulder 05/19/2016  . Alcohol use disorder, severe, dependence ([place]) 02/15/2016  . Muscle contusion 02/11/2016  . Left flaccid hemiparesis   . Muscle spasticity   . Poor appetite   .  Hyperlipidemia 01/14/2016  . Depression 01/14/2016  . Obesity 01/14/2016  . Hyperglycemia 01/14/2016  . Urinary tract infection, site not specified 01/14/2016  . Basal ganglia hemorrhage ([place]) 01/14/2016  . Hemiparesis affecting nondominant side as late effect of cerebrovascular accident ([place])   . Cognitive deficit, post-stroke   . Left hemiparesis ([place])   . Essential hypertension   . Dysphagia, post-stroke   . Gait disturbance, post-stroke   . Hyponatremia   . Thrombocytopenia ([place])   . Hemorrhagic stroke ([place]) R basal ganglia d/t HTN 01/08/2016    Zekiah Coen, Annye Rusk MS, CCC-SLP 07/14/2016, 12:07 PM  [facility] [address] [place], [place], [address] Phone: [phone]   Fax:  [phone]   Name: Jon [name] MRN: [id number] Date of Birth: 05-15-1956

## 2016-07-14 NOTE — Patient Instructions (Signed)
   SLOW LOUD OVER-ENNUNCIATE PAUSE    PATA TAKA KAPA PATAKA  BUTTERCUP  CATERPILLAR  BASEBALLL PLAYER  [place]  [place] BUCCANEERS  SLOW AND BIG - EXAGGERATE YOUR MOUTH, MAKE EACH CONSONANT   Speech exercises - do 5x each, x2-3/day SLOW BIG  SAY THE FOLLOWING- make every sound! Red leather, yellow leather Comical economists     Proper copper coffee pot Ripe purple cabbage Three free throws [place] [employer] Containered Bulb, [employer] Technologies CorporationBlue Bulb Flash Message  [place] [place] An Art gallery managerngineer for [employer] Five valve levers Six Thick Thistles Stick Double Bubble Gum [place] Fat frogs flip freely Freshly Fried Fat Fish Cinnamon aluminum linoleum Lovely lemon linament Tying Tape Takes Time A Shifty Salt Shaker   Shirts shrink, shells shouldn't [address] [address] Take the tackle box Give me five flapjacks Fundamental relatives Call the cat "Buttercup" Yellow oil ointment Catastrophe in [place] The church's chimes chimed Unique [place] A Three Toed Tree Toad Knapsack Strap Snap Rubber [address] Buggy Bumpers

## 2016-07-14 NOTE — Therapy (Signed)
[facility] [address] [place], [place], [address] Phone: [phone]   Fax:  [phone]  Occupational Therapy Treatment  Patient Details  Name: Jon [name] MRN: [id number] Date of Birth: 1956/03/20 Referring Provider: Dr Alysia Penna  Encounter Date: 07/14/2016      OT End of Session - 07/14/16 1210    Visit Number 20   Number of Visits 22   Date for OT Re-Evaluation 07/31/16   Authorization Type [employer] [facility])    Authorization Time Period renewal completed 05/31/16   Authorization - Visit Number 57   Authorization - Number of Visits 20   OT Start Time 1101   OT Stop Time 1151   OT Time Calculation (min) 50 min   Activity Tolerance Patient tolerated treatment well   Behavior During Therapy [facility] for tasks assessed/performed      Past Medical History:  Diagnosis Date  . Hypertension   . Liver damage   . Stroke [facility])     Past Surgical History:  Procedure Laterality Date  . TONSILLECTOMY AND ADENOIDECTOMY Bilateral    Age 60    There were no vitals filed for this visit.      Subjective Assessment - 07/14/16 1111    Subjective  Pt denies pain. Spouse reportst hat he sometimes leave LUE hanging down at his side.   Patient is accompained by: Family member   Patient Stated Goals Get my left arm going again and have more control of my left foot.   Currently in Pain? No/denies   Pain Score 0-No pain                      OT Treatments/Exercises (OP) - 07/14/16 0001      ADLs   ADL Comments Reviewed home program and ADL's using 1 handed techniques. Pt was encouraged to cont with home program I'ly, using exercise flowsheet as previously issued to increase frequency of performance of HEP and ADL's. Verbally reviewed therapist recommendation that pt should not be left home alone unsupervised secondary to safety reasons. Pt/spouse verbalized understanding of all of the above. Also  spoke briefly with PT as pt/spouse had question about getting ramp vs having rails built for house - PT recommended ramp for safety. Pt/spouse verbalized understanding of this at this time as well.     Modalities   Modalities Electrical Stimulation     Electrical Stimulation   Electrical Stimulation Location NMES 50 pps, 250 pw, 10 second cycle, intensity 21 to finger and wrist extensors x20 min.   Electrical Stimulation Action Focus on finger and wrist extension. Assisted by therapist when actively stimulated.    Electrical Stimulation Parameters See above  Therapist assist with digital, wrist & tumb, webspace stretc   Electrical Stimulation Goals Neuromuscular facilitation     Splinting   Splinting Stressed importance of use of splint at noc and if not, attempting to use intermittently during the day. Pt verbalized understanding but also stated that he perfers to have "my arm and hand free" during day and at noc.     Manual Therapy   Manual Therapy --  Gentle Active & Passive ROM digits/wrist for flex/exten.                OT Education - 07/14/16 1208    Education Details Cont Home program I'ly; Pt to be placed on hold until begining of Feb as he is planning to have botox in January 2018.  Pt will schedule f/u appointment for Feb.   Person(s) Educated Patient;Spouse   Methods Explanation;Demonstration   Comprehension Verbalized understanding;Returned demonstration          OT Short Term Goals - 06/21/16 1029      OT SHORT TERM GOAL #1   Title Pt will I state precautions/signs and symptoms of CVA   Baseline VC's   Time 4   Period Weeks   Status Achieved     OT SHORT TERM GOAL #2   Title Pt will demonstrate Mod I HEP for LUE self ROM    Baseline VC's and tc's   Time 4   Period Weeks   Status Partially Met  Pt requires v.c./ assist from wife     OT SHORT TERM GOAL #3   Title Pt will I state 2-3 edema control techniques LUE    Baseline VC's/Min A   Status  Deferred     OT SHORT TERM GOAL #4   Title Pt will use LUE as a stabilizer 10% of the time for ADLS/ IADLs.   Time 4   Period Weeks   Status Not Met  not using consistently     OT SHORT TERM GOAL #5   Title Pt will attend to left hemibody space with no more than min v.c. 75% of the time.   Time 4   Period Weeks   Status On-going  not consistent     OT SHORT TERM GOAL #6   Title Pt will verbalize understanding of proper LUE positioning to minimze risk for injury/ contracture.   Time 4   Period Weeks   Status On-going  needs reinforcement     OT SHORT TERM GOAL #7   Title Pt will demonstrate ability to grasp /release a cylindrical cone 2/3 trials with LUe in prep for functional use of LUE.   Time 4   Period Weeks   Status Not Met  pt is able to grasp cone, yet not release consistently           OT Long Term Goals - 07/14/16 1214      OT LONG TERM GOAL #1   Title Pt will be Mod I upgrade HEP LUE   Time 8   Period Weeks   Status On-going     OT LONG TERM GOAL #2   Title Pt will be Mod I simple snack prep in ADL kitchen (using manual w/c vs hemi walker).   Time 8   Period Weeks   Status Achieved  at w/c level per pt wife     OT LONG TERM GOAL #3   Title Pt will be Mod I basic ADL transfers using hemi walker    Period Weeks   Status On-going  Pt is I using RW     OT LONG TERM GOAL #4   Title Pt will be supervision LB dressing w/ a/e or DME PRN, using 1 handed technique   Time 8   Period Weeks   Status Achieved  met per pt wife     OT LONG TERM GOAL #5   Title Pt will be supervision level simulated bathing UB/LB using 1 handed technique   Time 8   Period Weeks   Status On-going  Achieved per pt wife     OT LONG TERM GOAL #6   Title Pt will use LUE as a stabilizer/ gross A 25% of the time for ADLs/ IADLs with pain no greater than 3/10.   Time  8   Period Weeks   Status On-going     OT LONG TERM GOAL #7   Title Pt will demonstrate 50% elbow flexion  and 25 % elbow extension for LUE in prep for functional use.   Time 8   Period Weeks   Status On-going               Plan - 07/14/16 1212    Clinical Impression Statement Pt to be placed on hold vs d/c at this time he plans to have botox in January. He will schedule OT appointment for February 2018 following botox and will cont home program and ADL's I'ly.   Rehab Potential Fair   OT Frequency 1x / week   OT Duration 8 weeks   OT Treatment/Interventions Self-care/ADL training;Therapeutic exercise;DME and/or AE instruction;Therapist, nutritional;Therapeutic activities;Patient/family education;Splinting;Neuromuscular education;Electrical Stimulation;Passive range of motion;Therapeutic exercises;Visual/perceptual remediation/compensation   Plan Pt to be placced on hold until Feb - planning for botox in January. Pt will cont home program I'ly.   Consulted and Agree with Plan of Care Patient;Family member/caregiver   Family Member Consulted Spouse      Patient will benefit from skilled therapeutic intervention in order to improve the following deficits and impairments:  Abnormal gait, Decreased balance, Decreased endurance, Difficulty walking, Impaired sensation, Impaired vision/preception, Pain, Increased edema, Decreased range of motion, Decreased cognition, Decreased activity tolerance, Decreased coordination, Decreased knowledge of use of DME, Decreased safety awareness, Decreased strength, Impaired UE functional use  Visit Diagnosis: Muscle weakness (generalized)  Hemiplegia and hemiparesis following cerebral infarction affecting left dominant side (HCC)  Other symptoms and signs involving cognitive functions following cerebral infarction  Other lack of coordination    Problem List Patient Active Problem List   Diagnosis Date Noted  . Neuropathic pain of left shoulder 05/19/2016  . Alcohol use disorder, severe, dependence ([place]) 02/15/2016  . Muscle contusion  02/11/2016  . Left flaccid hemiparesis   . Muscle spasticity   . Poor appetite   . Hyperlipidemia 01/14/2016  . Depression 01/14/2016  . Obesity 01/14/2016  . Hyperglycemia 01/14/2016  . Urinary tract infection, site not specified 01/14/2016  . Basal ganglia hemorrhage ([place]) 01/14/2016  . Hemiparesis affecting nondominant side as late effect of cerebrovascular accident ([place])   . Cognitive deficit, post-stroke   . Left hemiparesis ([place])   . Essential hypertension   . Dysphagia, post-stroke   . Gait disturbance, post-stroke   . Hyponatremia   . Thrombocytopenia ([place])   . Hemorrhagic stroke ([place]) R basal ganglia d/t HTN 01/08/2016    [place], Amy Ardath Sax, OTR/L 07/14/2016, 12:18 PM  [facility] [address] [place], [place], [address] Phone: [phone]   Fax:  [phone]  Name: Jon [name] MRN: [id number] Date of Birth: 1956/07/23

## 2016-07-19 ENCOUNTER — Ambulatory Visit: Payer: Self-pay

## 2016-07-19 ENCOUNTER — Ambulatory Visit: Payer: 59 | Admitting: Speech Pathology

## 2016-07-19 ENCOUNTER — Encounter: Payer: Self-pay | Admitting: Occupational Therapy

## 2016-07-19 DIAGNOSIS — I69352 Hemiplegia and hemiparesis following cerebral infarction affecting left dominant side: Secondary | ICD-10-CM | POA: Diagnosis not present

## 2016-07-19 DIAGNOSIS — R471 Dysarthria and anarthria: Secondary | ICD-10-CM

## 2016-07-19 NOTE — Patient Instructions (Addendum)
  Stick out your tongue and curl it up to your nose - don't use your lower lip to help  Tongue down to your chin, then up to your nose  Scrape the roof of  Your mouth from your teeth back to your throat   Oral exercises/Swallow exercises  Use the mirror - Do 15x each twice a day  Say "ooo" then "eee"  BIG!  Stick out your tongue and curl tongue up to your nose  Scrape the roof of your mouth from your teeth to your throat   Move the tongue around your top and bottom teeth in a circle, clockwise, then counter clockwise  Click your tongue on the roof of your mouth, like a horse sound  Push out each cheek with your tongue, alternate side to side - push in with your finger to add resistance    Do all 15x each 3x a day

## 2016-07-19 NOTE — Therapy (Signed)
[facility] [address] [place], [place], [address] Phone: [phone]   Fax:  [phone]  Speech Language Pathology Treatment  Patient Details  Name: Jon [name] MRN: [id number] Date of Birth: 12/23/1955 Referring Provider: Dr. Andrew Kirsteins   Encounter Date: 07/19/2016      End of Session - 07/19/16 1224    Visit Number 20   Number of Visits 25   Date for SLP Re-Evaluation 08/04/16   Authorization - Visit Number 20   Authorization - Number of Visits 20   SLP Start Time 0847   SLP Stop Time  0931   SLP Time Calculation (min) 44 min   Activity Tolerance Patient tolerated treatment well      Past Medical History:  Diagnosis Date  . Hypertension   . Liver damage   . Stroke (HCC)     Past Surgical History:  Procedure Laterality Date  . TONSILLECTOMY AND ADENOIDECTOMY Bilateral    Age 6    There were no vitals filed for this visit.      Subjective Assessment - 07/19/16 0849    Subjective "I didn't use it but I try to think it" re: speech/dysarthria homework   Patient is accompained by: Family member   Special Tests [name]               ADULT SLP TREATMENT - 07/19/16 0849      General Information   Behavior/Cognition Alert;Cooperative;Pleasant mood     Treatment Provided   Treatment provided Cognitive-Linquistic     Pain Assessment   Pain Assessment No/denies pain     Cognitive-Linquistic Treatment   Treatment focused on Dysarthria   Skilled Treatment Reviewed compensations for dysarthria with rare min cues. Pt performed HEP for dysarthria with rare min A. Pt utilized compensations for dysarthria in sentence level structured task with occassional min verbal cues. HEP for facial droop completed with rare min A.      Assessment / Recommendations / Plan   Plan Continue with current plan of care     Progression Toward Goals   Progression toward goals Goals met, education  completed, patient discharged from SLP          SLP Education - 07/19/16 1222    Education provided Yes   Education Details Cont at home HEP for dysarthria and facial droop.    Person(s) Educated Patient;Spouse   Methods Explanation;Demonstration;Handout   Comprehension Verbalized understanding;Returned demonstration     SPEECH THERAPY DISCHARGE SUMMARY  Visits from Start of Care: 20  Current functional level related to goals / functional outcomes: See goals below   Remaining deficits: Mildly slow processing, slight dysarthria   Education / Equipment: Compensations for cognitive impairments, compensations for dysarthria, HEP for dysarthria Plan: Patient agrees to discharge.  Patient goals were met. Patient is being discharged due to meeting the stated rehab goals.  ?????          SLP Short Term Goals - 07/19/16 1224      SLP SHORT TERM GOAL #1   Title Pt will selectively attend to details on mildly complex cognitive linguistic task with distractions and 85% accuracy.   Status Achieved     SLP SHORT TERM GOAL #2   Title Pt will perform mildy complex organization, reasoning and time/money problems with 85% accuracy and rare min A   Status Achieved     SLP SHORT TERM GOAL #3   Title Pt  will utilize external memory aids/book to   recall tasks, errands, lists, and reduce repetitive question asking/frustration over 3 sessions with occasional min A.   Status Achieved     SLP SHORT TERM GOAL #4   Title Pt will face and maintain eye contact conversation partner on his left side with rare min A for 5 minutes conversation.    Status Not Met     SLP SHORT TERM GOAL #5   Title pt will complete HEP with occasional min A (for dysarthria)   Status Achieved          SLP Long Term Goals - 07/19/16 1224      SLP LONG TERM GOAL #1   Title Pt will alterate attention between 2 mildly complex cognitive linguistic tasks with 85% on each and occasional min A   Time 3   Status  Achieved     SLP LONG TERM GOAL #2   Title Pt will solve moderately complex reasoning, functional math and organization problems with 90% accuracy and occasional min A   Status Achieved     SLP LONG TERM GOAL #3   Title Pt will utilize external aids for medication management with rare min A over 3 sessions.   Status Achieved     SLP LONG TERM GOAL #4   Title pt will complete HEP for dysarthria with rare min A over two sessions   Status Achieved     SLP LONG TERM GOAL #5   Title Pt will complete complex reasoning, problem solving and functional math with 85% accuracy and rare min A   Time 2   Period Weeks   Status Achieved     SLP LONG TERM GOAL #6   Title Pt will divide attention between 2 moderately complex cognitive linguistic tasks with mildly extended time, occasiona min A and 85% accuracy on each.    Time 2   Period Weeks   Status Achieved          Plan - 07/19/16 1223    Clinical Impression Statement Pt has achieved cognitive and speech goals. His speech is intelligible, with slight slur - pt I in HEP for this and will continue to practice at home. Recommend pt d/c from ST at this time - pt and spouse in agreement   Treatment/Interventions Compensatory strategies;Functional tasks;Patient/family education;Cueing hierarchy;Cognitive reorganization;Internal/external aids;SLP instruction and feedback   Potential to Achieve Goals Good   Consulted and Agree with Plan of Care Patient;Family member/caregiver   Family Member Consulted spouse, [name]      Patient will benefit from skilled therapeutic intervention in order to improve the following deficits and impairments:   Dysarthria and anarthria    Problem List Patient Active Problem List   Diagnosis Date Noted  . Neuropathic pain of left shoulder 05/19/2016  . Alcohol use disorder, severe, dependence ([place]) 02/15/2016  . Muscle contusion 02/11/2016  . Left flaccid hemiparesis   . Muscle spasticity   . Poor appetite    . Hyperlipidemia 01/14/2016  . Depression 01/14/2016  . Obesity 01/14/2016  . Hyperglycemia 01/14/2016  . Urinary tract infection, site not specified 01/14/2016  . Basal ganglia hemorrhage ([place]) 01/14/2016  . Hemiparesis affecting nondominant side as late effect of cerebrovascular accident ([place])   . Cognitive deficit, post-stroke   . Left hemiparesis ([place])   . Essential hypertension   . Dysphagia, post-stroke   . Gait disturbance, post-stroke   . Hyponatremia   . Thrombocytopenia ([place])   . Hemorrhagic stroke ([place]) R basal ganglia d/t HTN 01/08/2016  Lov[place], Laura Ann MS, CCC-SLP 07/19/2016, 12:25 PM  [facility] [address] [place], [place], [address] Phone: [phone]   Fax:  [phone]   Name: Jon [name] MRN: [id number] Date of Birth: 02/21/1956  

## 2016-07-20 ENCOUNTER — Telehealth: Payer: Self-pay | Admitting: Neurology

## 2016-07-20 NOTE — Telephone Encounter (Signed)
Pt's wife called wanting to know if botox could be done in the clinic on his hand (which is spatic due to the stroke). She said the cost would be much lower-Dr Doroteo BradfordKirstein office has to charge a $200 hospital fee in addition to the copay for botox. I told her I did not think this could be done in the clinic but a RN would call her back. She was advised Dr Juliann ParesX and his RN are out of the clinic this week

## 2016-07-20 NOTE — Telephone Encounter (Signed)
Dr Lucia GaskinsAhern- you are the work-in doctor this am. This is a Dr Roda ShuttersXu pt. Do you think Dr Terrace ArabiaYan could do this for the patient?

## 2016-07-20 NOTE — Telephone Encounter (Signed)
Returned call to patient's wife (on HIPAA) - states pt has to pay a $200 facility fee, in addition to a co-pay for Botox at Dr. Wynn BankerKirsteins office.  This has become cost prohibitive for them.  She would like to know if it would be less expensive to have his injections done through our office.  If so, then Dr. Terrace ArabiaYan is willing to see the patient for an initial consult appt to discuss taking over his treatment.  They are aware that his January injection will be delayed, if they choose to transition care here.  She is aware that Duwayne HeckDanielle is out for the week but will get back to her concerning the cost.

## 2016-07-20 NOTE — Telephone Encounter (Signed)
I am the work-in doctor answering this for a patient of Dr. Roda ShuttersXu. Dr. Terrace ArabiaYan should able to perform this, looks like he only needs 100 units in the finger flexors per dr. Wynn BankerKirsteins.  Let patient know I am forwarding to Dr. Terrace ArabiaYan for review and she can get back to him but there is a possibility thanks.

## 2016-07-20 NOTE — Telephone Encounter (Signed)
Called and spoke with wife. Relayed we are waiting on response from Dr Terrace ArabiaYan to see if she can do botox. We will call back and let them know if she can or not.  She stated Dr Lum KeasKeirstein's office is waiting on doing PA for botox until the first of the year. She said we can call and LVM on [phone]  If needed.

## 2016-07-20 NOTE — Telephone Encounter (Signed)
Chart reviewed, he already has appt with Dr, Doroteo BradfordKirstein on Jan 9th 2018 for BOTOX Injection, if he wants to start injection from our office, I prefer to see him first and then initiate paperwork before he can come back get BOTOX injection, this usually take 2-3 weeks to complete. [facility]

## 2016-07-27 NOTE — Telephone Encounter (Signed)
Returned call - spoke to pt's wife - consultation appt scheduled on 07/28/16.  Converted appt on 09/01/16 to the 1st injection.  She said he will be getting new insurance that goes into effect on 08/25/16.  She will bring the updated information tomorrow.

## 2016-07-27 NOTE — Telephone Encounter (Signed)
I called the patients wife back and left a VM asking for her to give me a call.

## 2016-07-27 NOTE — Telephone Encounter (Signed)
Spoke with the patients wife and offered her a consultation apt. She was questioning if her husband should go ahead and have his first injection on January 9th. I informed her that he could have that injection and we would schedule the following injection in our office after his consultation. She wanted to know if it was ok for him to wait that long, I informed her that this was not something I could decide for them. She requested to speak with the nurse because she wanted to know if this was something that could be bad for them if they wait to have the injection until Dr. Terrace ArabiaYan is available. Please call and advise.

## 2016-07-28 ENCOUNTER — Encounter: Payer: Self-pay | Admitting: Neurology

## 2016-07-28 ENCOUNTER — Ambulatory Visit (INDEPENDENT_AMBULATORY_CARE_PROVIDER_SITE_OTHER): Payer: 59 | Admitting: Neurology

## 2016-07-28 VITALS — BP 113/64 | HR 59 | Ht 71.0 in | Wt 148.0 lb

## 2016-07-28 DIAGNOSIS — I69359 Hemiplegia and hemiparesis following cerebral infarction affecting unspecified side: Secondary | ICD-10-CM

## 2016-07-28 DIAGNOSIS — I610 Nontraumatic intracerebral hemorrhage in hemisphere, subcortical: Secondary | ICD-10-CM

## 2016-07-28 NOTE — Progress Notes (Signed)
PATIENT: Jon [name] DOB: 1956/07/12  Chief Complaint  Patient presents with  . Spastic Hemiplegia/Hx of Stroke    He is here with his wife, Jon [name], to be evaluated for Botox injections.       HISTORICAL  Jon [name] is a 61 years old left-handed male, accompanied by his wife, seen in refer by  his primary care physician from [facility] Dr. Elias Else for evaluation of left spastic hemiparesis following his stroke, initial evaluation was on January 4th 2018,  I reviewed and summarized the history, he had a history of hypertension, hyperlipidemia, alcohol abuse, presented with severe headache, slurred speech, left-sided weakness, elevated blood pressure on December 29 2015,  I personally reviewed CT, and MRI scans, CT in June 2017, right basal ganglion hemorrhage with 31 cc volume, 3 mm midline shift, CT angiogram of neck and brain showed no significant abnormality.  MRI of the brain On May 27 2016,sequela of right basal ganglion bleeding,  MRA of the brain in October 2017, there was no evidence of vascular malformation or aneurysm.  He is scheduled to have BOTX on Jan 9th 2017 at rehabilitation, but is concerned about the high hospital co-pay  He now can ambulate with a hemiwalker, has trace movement of left shoulder, left shoulder and wrist pain upon passive movement,  REVIEW OF SYSTEMS: Full 14 system review of systems performed and notable only for walking difficulty  ALLERGIES: Allergies  Allergen Reactions  . Gluten Meal Nausea And Vomiting  . Lactose Intolerance (Gi) Nausea And Vomiting  . Other     Tree nuts   . Shellfish Allergy Swelling and Rash    HOME MEDICATIONS: Current Outpatient Prescriptions  Medication Sig Dispense Refill  . amLODipine (NORVASC) 10 MG tablet Take 1 tablet (10 mg total) by mouth daily. 30 tablet 1  . aspirin EC 81 MG tablet Take 1 tablet (81 mg total) by mouth daily.    . baclofen (LIORESAL) 20 MG tablet TAKE 1 TABLET(20 MG) BY MOUTH  FOUR TIMES DAILY 360 tablet 1  . diclofenac sodium (VOLTAREN) 1 % GEL Apply 2 g topically 4 (four) times daily. 1 Tube 1  . folic acid (FOLVITE) 1 MG tablet Take 1 tablet (1 mg total) by mouth daily. 30 tablet 0  . metoprolol (LOPRESSOR) 100 MG tablet Take 0.5 tablets (50 mg total) by mouth 2 (two) times daily.    . Multiple Vitamin (MULTIVITAMIN WITH MINERALS) TABS tablet Take 1 tablet by mouth daily.    . pantoprazole (PROTONIX) 40 MG tablet Take 1 tablet (40 mg total) by mouth daily. 30 tablet 1  . pravastatin (PRAVACHOL) 40 MG tablet Take 1 tablet (40 mg total) by mouth daily at 6 PM. 30 tablet 1  . sertraline (ZOLOFT) 50 MG tablet Take 1 tablet (50 mg total) by mouth daily. 30 tablet 1   No current facility-administered medications for this visit.    Facility-Administered Medications Ordered in Other Visits  Medication Dose Route Frequency Provider Last Rate Last Dose  . gadopentetate dimeglumine (MAGNEVIST) injection 15 mL  15 mL Intravenous Once PRN Marvel Plan, MD        PAST MEDICAL HISTORY: Past Medical History:  Diagnosis Date  . Hypertension   . Liver damage   . Stroke [facility])     PAST SURGICAL HISTORY: Past Surgical History:  Procedure Laterality Date  . TONSILLECTOMY AND ADENOIDECTOMY Bilateral    Age 41    FAMILY HISTORY: No family history on file.  SOCIAL HISTORY:  Social History   Social History  . Marital status: Married    Spouse name: N/A  . Number of children: 1   . Years of education: N/A   Occupational History  . Manufacture job   Social History Main Topics  . Smoking status: Former Smoker    Packs/day: 0.50    Years: 19.00    Types: Cigarettes    Quit date: 02/22/2000  . Smokeless tobacco: Never Used  . Alcohol use No     Comment: Wife reports chronic alcohol use. 01/12/16  . Drug use: No     Comment: teens to early 20's  . Sexual activity: Not on file   Other Topics Concern  . Not on file   Social History Narrative  . No narrative on  file     PHYSICAL EXAM   Vitals:   07/28/16 1116  BP: 113/64  Pulse: (!) 59  Weight: 148 lb (67.1 kg)  Height: 5\' 11"  (1.803 m)    Not recorded      Body mass index is 20.64 kg/m.  PHYSICAL EXAMNIATION:  Gen: NAD, conversant, well nourised, obese, well groomed                     Cardiovascular: Regular rate rhythm, no peripheral edema, warm, nontender. Eyes: Conjunctivae clear without exudates or hemorrhage Neck: Supple, no carotid bruits. Pulmonary: Clear to auscultation bilaterally   NEUROLOGICAL EXAM:  MENTAL STATUS: Speech:    Speech is normal; fluent and spontaneous with normal comprehension.  Cognition:     Orientation to time, place and person     Normal recent and remote memory     Normal Attention span and concentration     Normal Language, naming, repeating,spontaneous speech     Fund of knowledge   CRANIAL NERVES: CN II: Visual fields are full to confrontation. Fundoscopic exam is normal with sharp discs and no vascular changes. Pupils are round equal and briskly reactive to light. CN III, IV, VI: extraocular movement are normal. No ptosis. CN V: Facial sensation is intact to pinprick in all 3 divisions bilaterally. Corneal responses are intact.  CN VII: He has mild left upper and lower face weakness. CN VIII: Hearing is normal to rubbing fingers CN IX, X: Palate elevates symmetrically. Phonation is normal. CN XI: Head turning and shoulder shrug are intact CN XII: Tongue is midline with normal movements and no atrophy.  MOTOR: He has left hemiparesis, with mildly increased tone at left shoulder, left wrist, complains of pain upon passive movement, there was trace movement of left shoulder, no significant movement of distal left arm, maximum range of motion, left shoulder 80, left elbow 180, left wrist 90. Able to straight his finger. Flaccid left lower extremity, left hip flexion 3, knee flexion 3, knee extension 3, left ankle dorsiflexion 1, left  ankle plantarflexion 1.  REFLEXES: Reflexes are 2+ and symmetric at the biceps, triceps, knees, and ankles. Plantar responses are flexor bilaterally.   SENSORY: Intact to light touch, pinprick, positional sensation and vibratory sensation are intact in fingers and toes.  COORDINATION: There is no dysmetria on right finger-to-nose and heel-knee-shin.    GAIT/STANCE: He ambulates with left hemi-circumferential unsteady gait  DIAGNOSTIC DATA (LABS, IMAGING, TESTING) - I reviewed patient records, labs, notes, testing and imaging myself where available.   ASSESSMENT AND PLAN  Jon [name] is a 61 y.o. male   Left hemiparesis from right basal ganglion bleeding in June 2017,  He will benefit EMG guided botulism toxin injection  Started [employer] preauthorization, ask for 400 units, will use 200 units on first injection for left upper extremity   Levert FeinsteinYijun Christyana Corwin, M.D. Ph.D.  [facility] [address], [address] [place], [place] [address] Ph: [phone] Fax: [phone]  CC: Elias Elseobert Reade, MD

## 2016-07-28 NOTE — Telephone Encounter (Signed)
Noted  

## 2016-08-02 ENCOUNTER — Encounter: Payer: 59 | Attending: Physical Medicine & Rehabilitation

## 2016-08-02 ENCOUNTER — Ambulatory Visit (HOSPITAL_BASED_OUTPATIENT_CLINIC_OR_DEPARTMENT_OTHER): Payer: 59 | Admitting: Physical Medicine & Rehabilitation

## 2016-08-02 ENCOUNTER — Encounter: Payer: Self-pay | Admitting: Physical Medicine & Rehabilitation

## 2016-08-02 ENCOUNTER — Telehealth: Payer: Self-pay | Admitting: Neurology

## 2016-08-02 VITALS — BP 113/70 | HR 56

## 2016-08-02 DIAGNOSIS — I10 Essential (primary) hypertension: Secondary | ICD-10-CM | POA: Diagnosis not present

## 2016-08-02 DIAGNOSIS — Z5189 Encounter for other specified aftercare: Secondary | ICD-10-CM | POA: Insufficient documentation

## 2016-08-02 DIAGNOSIS — I629 Nontraumatic intracranial hemorrhage, unspecified: Secondary | ICD-10-CM | POA: Diagnosis not present

## 2016-08-02 DIAGNOSIS — I639 Cerebral infarction, unspecified: Secondary | ICD-10-CM | POA: Diagnosis present

## 2016-08-02 DIAGNOSIS — I69359 Hemiplegia and hemiparesis following cerebral infarction affecting unspecified side: Secondary | ICD-10-CM | POA: Diagnosis not present

## 2016-08-02 DIAGNOSIS — F1010 Alcohol abuse, uncomplicated: Secondary | ICD-10-CM | POA: Diagnosis not present

## 2016-08-02 DIAGNOSIS — G8194 Hemiplegia, unspecified affecting left nondominant side: Secondary | ICD-10-CM | POA: Insufficient documentation

## 2016-08-02 DIAGNOSIS — R252 Cramp and spasm: Secondary | ICD-10-CM | POA: Insufficient documentation

## 2016-08-02 NOTE — Telephone Encounter (Signed)
Pt's wife c/a botox appt. She advised he had botox injection today at Dr Wynn BankerKirsteins office. She will call back to schedule next appt   FYI

## 2016-08-02 NOTE — Progress Notes (Signed)
Botox Injection for spasticity using needle EMG guidance  Dilution: 50 Units/ml Indication: Severe spasticity which interferes with ADL,mobility and/or  hygiene and is unresponsive to medication management and other conservative care Informed consent was obtained after describing risks and benefits of the procedure with the patient. This includes bleeding, bruising, infection, excessive weakness, or medication side effects. A REMS form is on file and signed. Needle: 27g 1" needle electrode Number of units per muscle  FCR25 FCU0 FDS25 FDP25 FPL25   All injections were done after obtaining appropriate EMG activity and after negative drawback for blood. The patient tolerated the procedure well. Post procedure instructions were given. A followup appointment was made.

## 2016-08-16 ENCOUNTER — Ambulatory Visit: Payer: 59 | Admitting: Occupational Therapy

## 2016-08-23 ENCOUNTER — Ambulatory Visit: Payer: 59 | Attending: Physical Medicine & Rehabilitation | Admitting: Occupational Therapy

## 2016-08-23 DIAGNOSIS — R41842 Visuospatial deficit: Secondary | ICD-10-CM | POA: Diagnosis present

## 2016-08-23 DIAGNOSIS — I69318 Other symptoms and signs involving cognitive functions following cerebral infarction: Secondary | ICD-10-CM | POA: Insufficient documentation

## 2016-08-23 DIAGNOSIS — I69352 Hemiplegia and hemiparesis following cerebral infarction affecting left dominant side: Secondary | ICD-10-CM

## 2016-08-23 DIAGNOSIS — R278 Other lack of coordination: Secondary | ICD-10-CM | POA: Diagnosis present

## 2016-08-23 DIAGNOSIS — M6281 Muscle weakness (generalized): Secondary | ICD-10-CM | POA: Insufficient documentation

## 2016-08-23 NOTE — Therapy (Signed)
[facility] [address] [place], [place], [address] Phone: [phone]   Fax:  [phone]  Occupational Therapy Treatment  Patient Details  Name: Jon [name] MRN: [id number] Date of Birth: [birth year] Referring Provider: Dr Claudette Laws  Encounter Date: 08/23/2016      OT End of Session - 08/23/16 1258    Visit Number 1   Number of Visits 13   Date for OT Re-Evaluation 10/06/16   Authorization Type [employer] [facility])    Authorization Time Period Pt's wife reports he is changing insurance in Feb, Pt is currently scheduled for 10 visits, pt can benefit from saving visits for the future, pt perfers [name]   Authorization - Visit Number 1   Authorization - Number of Visits 20   OT Start Time 0935   OT Stop Time 1015   OT Time Calculation (min) 40 min   Activity Tolerance Patient tolerated treatment well   Behavior During Therapy WFL for tasks assessed/performed      Past Medical History:  Diagnosis Date  . Hypertension   . Liver damage   . Stroke [facility])     Past Surgical History:  Procedure Laterality Date  . TONSILLECTOMY AND ADENOIDECTOMY Bilateral    Age 61    There were no vitals filed for this visit.      Subjective Assessment - 08/23/16 0941    Subjective  Pt s/p botox injection 08/02/16 to LUE returns to occupational therapy   Pertinent History see epic   Patient Stated Goals Get my left arm going again and have more control of my left foot.   Currently in Pain? No/denies            [facility] OT Assessment - 08/23/16 1301      Assessment   Diagnosis CVA s/p botox 08/02/16   Onset Date 08/02/16   Prior Therapy OT     Precautions   Precautions Other (comment);Fall   Precaution Comments left neglect     Home  Environment   Lives With Spouse     Prior Function   Level of Independence Independent     ADL   ADL comments Pt and wife report pt is performing ADLs with supervision  mostly, occaisional asssist     Vision Assessment   Patient has diffculty with activities due to visual impairment --  decreased attention to left hemibody space   Comment decreased eye contact      Cognition   Overall Cognitive Status Impaired/Different from baseline   Area of Impairment Memory;Problem solving   Attention Selective   Awareness Impaired   Behaviors --  Flat affect, left neglect     Tone   Assessment Location Left Upper Extremity     ROM / Strength   AROM / PROM / Strength AROM;PROM     AROM   Overall AROM  --  Does not use consistently as stabilizer   Overall AROM Comments trace sh. flex and shoulder shrug, elbow flexion to 100*, finger flexion grossly 50%, unable to extend digits or elbow     PROM   Overall PROM  Deficits;Due to pain   Overall PROM Comments shoulder flexion in supine is limited to 90*, humeral head is anterior     LUE Tone   LUE Tone Hypertonic     LUE Tone   Hypertonic Details increased spasticity at fingers and elbow, improved since botox  supine,gentle joint mobs at shoulder, AA/ROM shoulder and scapular retraction, P/ROM shoulder flexion AA/ROM elbow flexion, P/ROM finger flexion and wrist ext.                OT Short Term Goals - 08/23/16 1141      OT SHORT TERM GOAL #1   Title I with updated HEP.   Time 3   Period Weeks   Status New     OT SHORT TERM GOAL #2   Title Pt will consistently attend to left hemibody space with no more than min v.c.   Time 3   Period Weeks   Status New     OT SHORT TERM GOAL #3   Title Pt will use LUE as a stabilizer 10% of the time for ADLs/ IADLs.   Time 3   Period Weeks   Status New     OT SHORT TERM GOAL #4   Title -------------------------------------------------------------------------------------------------           OT Long Term Goals - 08/23/16 1142      OT LONG TERM GOAL #1   Title Pt will demonstrate ability to perform functional grasp/  release of a cylindrical object with LUE  2/4 trials   Time 6   Period Weeks   Status New     OT LONG TERM GOAL #2   Title Pt will demonstrate 30* shoulder flexion in prep for funtional reach.   Time 6   Period Weeks   Status New     OT LONG TERM GOAL #3   Title Pt will demonstrate 25 % elbow ext in prep for functional reach.   Time 6   Period Weeks   Status New     OT LONG TERM GOAL #4   Title ---------------------------------------------------------------------------------------------------------               Plan - 08/23/16 1114    Clinical Impression Statement Pt with hx of CVA 01/08/16, who previously received occupational therapy is now s/p botox on 08/02/16. Pt can benefit from skilled occupational therapy to address LUE weakness, decreased balance, left neglect , cognitive deficits, sensory impairment in order to improve LUE functional use, safety and independence with ADLs.   Rehab Potential Fair   OT Frequency 2x / week  10 visits   OT Duration 6 weeks   OT Treatment/Interventions Self-care/ADL training;Therapeutic exercise;DME and/or AE instruction;Building services engineer;Therapeutic activities;Patient/family education;Splinting;Neuromuscular education;Electrical Stimulation;Passive range of motion;Therapeutic exercises;Visual/perceptual remediation/compensation   Plan neuromuscular re-ed , update HEP   Consulted and Agree with Plan of Care Patient   Family Member Consulted Spouse      Patient will benefit from skilled therapeutic intervention in order to improve the following deficits and impairments:  Abnormal gait, Decreased balance, Decreased endurance, Difficulty walking, Impaired sensation, Impaired vision/preception, Pain, Increased edema, Decreased range of motion, Decreased cognition, Decreased activity tolerance, Decreased coordination, Decreased knowledge of use of DME, Decreased safety awareness, Decreased strength, Impaired UE functional  use  Visit Diagnosis: Muscle weakness (generalized)  Hemiplegia and hemiparesis following cerebral infarction affecting left dominant side (HCC)  Other symptoms and signs involving cognitive functions following cerebral infarction  Other lack of coordination    Problem List Patient Active Problem List   Diagnosis Date Noted  . Neuropathic pain of left shoulder 05/19/2016  . Alcohol use disorder, severe, dependence (HCC) 02/15/2016  . Muscle contusion 02/11/2016  . Left flaccid hemiparesis   . Muscle spasticity   . Poor appetite   . Hyperlipidemia 01/14/2016  .  Depression 01/14/2016  . Obesity 01/14/2016  . Hyperglycemia 01/14/2016  . Urinary tract infection, site not specified 01/14/2016  . Basal ganglia hemorrhage (HCC) 01/14/2016  . Hemiparesis affecting nondominant side as late effect of cerebrovascular accident (HCC)   . Cognitive deficit, post-stroke   . Left hemiparesis (HCC)   . Essential hypertension   . Dysphagia, post-stroke   . Gait disturbance, post-stroke   . Hyponatremia   . Thrombocytopenia (HCC)   . Hemorrhagic stroke (HCC) R basal ganglia d/t HTN 01/08/2016    RINE,KATHRYN 08/23/2016, 1:15 PM  [facility] [address] [place], [place], [address] Phone: [phone]   Fax:  [phone]  Name: Tawanna CoolerGeorge D Gossman MRN: [id number] Date of Birth: 1955/11/07

## 2016-08-25 ENCOUNTER — Ambulatory Visit: Payer: 59 | Attending: Physical Medicine & Rehabilitation | Admitting: Occupational Therapy

## 2016-08-25 DIAGNOSIS — R41842 Visuospatial deficit: Secondary | ICD-10-CM | POA: Insufficient documentation

## 2016-08-25 DIAGNOSIS — I69318 Other symptoms and signs involving cognitive functions following cerebral infarction: Secondary | ICD-10-CM | POA: Diagnosis not present

## 2016-08-25 DIAGNOSIS — R278 Other lack of coordination: Secondary | ICD-10-CM | POA: Diagnosis not present

## 2016-08-25 DIAGNOSIS — M6281 Muscle weakness (generalized): Secondary | ICD-10-CM

## 2016-08-25 DIAGNOSIS — I69352 Hemiplegia and hemiparesis following cerebral infarction affecting left dominant side: Secondary | ICD-10-CM

## 2016-08-25 NOTE — Therapy (Signed)
[facility] [address] [place], [place], [address] Phone: [phone]   Fax:  [phone]  Occupational Therapy Treatment  Patient Details  Name: Jon CoolerGeorge D Hoge MRN: [id number] Date of Birth: April 21, 1963 Referring Provider: Dr Claudette LawsAndrew Kirsteins  Encounter Date: 08/25/2016      OT End of Session - 08/25/16 1032    Visit Number 2   Number of Visits 13   Date for OT Re-Evaluation 10/06/16   Authorization Type UHC [facility]([employer])    Authorization Time Period Pt's wife reports he is changing insurance in Feb, Pt is currently scheduled for 10 visits, pt can benefit from saving visits for the future, pt prefers [name]   Authorization - Visit Number 2   Authorization - Number of Visits 20   OT Start Time [phone]   OT Stop Time 1021   OT Time Calculation (min) 45 min   Activity Tolerance Patient tolerated treatment well   Behavior During Therapy WFL for tasks assessed/performed      Past Medical History:  Diagnosis Date  . Hypertension   . Liver damage   . Stroke [facility](HCC)     Past Surgical History:  Procedure Laterality Date  . TONSILLECTOMY AND ADENOIDECTOMY Bilateral    Age 61    There were no vitals filed for this visit.      Subjective Assessment - 08/25/16 0948    Subjective  Pt s/p botox injection 08/02/16 to LUE returns to occupational therapy   Pertinent History see epic   Limitations Pt reports he sometimes does not want to lie down as he is having urinary problems   Patient Stated Goals Get my left arm going again and have more control of my left foot.   Currently in Pain? No/denies               Gentle P/ROM to digits and wrist, pt demonstrates significant tightness in wrist extension. Therapist encouraged pt and wife to perform P/ROM to digits and wrist at home. Pt was encouraged to attend to left side and persons on his left side who are talking with him for improved left side  awareness. NMES x 10 mins to wrist and finger extensors 50 pps, 250 pw,  10 secs cycle, intensity 25, no adverse reactions. Therapist facilitated wrist and finger ext. During on cycle Therapist attempted use on triceps but was unable to obtain a good response and pt reported discomfort so it was discontinued. Seated edge of mat, weightbearing through bench with left elbow, body on arm movements with lateral trunk flexion/ extension weight shifts, trunk rotation while weightbearing through elbow while reaching with RUE to target to place clothespins, min v.c/ facilitation for positioning. Pt declined to lay down today because of urinary concerns. Therapist reinforced with pt that laying down will likely be the best position for stretching the shoulder and improving shoulder alignment in future visits.                  OT Short Term Goals - 08/23/16 1141      OT SHORT TERM GOAL #1   Title I with updated HEP.   Time 3   Period Weeks   Status New     OT SHORT TERM GOAL #2   Title Pt will consistently attend to left hemibody space with no more than min v.c.   Time 3   Period Weeks   Status New     OT SHORT TERM GOAL #3  Title Pt will use LUE as a stabilizer 10% of the time for ADLs/ IADLs.   Time 3   Period Weeks   Status New     OT SHORT TERM GOAL #4   Title -------------------------------------------------------------------------------------------------           OT Long Term Goals - 08/23/16 1142      OT LONG TERM GOAL #1   Title Pt will demonstrate ability to perform functional grasp/ release of a cylindrical object with LUE  2/4 trials   Time 6   Period Weeks   Status New     OT LONG TERM GOAL #2   Title Pt will demonstrate 30* shoulder flexion in prep for funtional reach.   Time 6   Period Weeks   Status New     OT LONG TERM GOAL #3   Title Pt will demonstrate 25 % elbow ext in prep for functional reach.   Time 6   Period Weeks   Status New     OT  LONG TERM GOAL #4   Title ---------------------------------------------------------------------------------------------------------             Patient will benefit from skilled therapeutic intervention in order to improve the following deficits and impairments:     Visit Diagnosis: Muscle weakness (generalized)  Hemiplegia and hemiparesis following cerebral infarction affecting left dominant side (HCC)  Other symptoms and signs involving cognitive functions following cerebral infarction  Other lack of coordination  Visuospatial deficit    Problem List Patient Active Problem List   Diagnosis Date Noted  . Neuropathic pain of left shoulder 05/19/2016  . Alcohol use disorder, severe, dependence (HCC) 02/15/2016  . Muscle contusion 02/11/2016  . Left flaccid hemiparesis   . Muscle spasticity   . Poor appetite   . Hyperlipidemia 01/14/2016  . Depression 01/14/2016  . Obesity 01/14/2016  . Hyperglycemia 01/14/2016  . Urinary tract infection, site not specified 01/14/2016  . Basal ganglia hemorrhage (HCC) 01/14/2016  . Hemiparesis affecting nondominant side as late effect of cerebrovascular accident (HCC)   . Cognitive deficit, post-stroke   . Left hemiparesis (HCC)   . Essential hypertension   . Dysphagia, post-stroke   . Gait disturbance, post-stroke   . Hyponatremia   . Thrombocytopenia (HCC)   . Hemorrhagic stroke (HCC) R basal ganglia d/t HTN 01/08/2016    [name],Jon 08/25/2016, 10:44 AM  [facility] [address] [place], [place], [address] Phone: [phone]   Fax:  [phone]  Name: Jon [name] MRN: [id number] Date of Birth: 1962/08/21

## 2016-08-29 ENCOUNTER — Ambulatory Visit: Payer: 59 | Admitting: Occupational Therapy

## 2016-08-29 DIAGNOSIS — I69318 Other symptoms and signs involving cognitive functions following cerebral infarction: Secondary | ICD-10-CM | POA: Diagnosis not present

## 2016-08-29 DIAGNOSIS — R41842 Visuospatial deficit: Secondary | ICD-10-CM | POA: Diagnosis not present

## 2016-08-29 DIAGNOSIS — R278 Other lack of coordination: Secondary | ICD-10-CM | POA: Diagnosis not present

## 2016-08-29 DIAGNOSIS — I69352 Hemiplegia and hemiparesis following cerebral infarction affecting left dominant side: Secondary | ICD-10-CM

## 2016-08-29 DIAGNOSIS — M6281 Muscle weakness (generalized): Secondary | ICD-10-CM | POA: Diagnosis not present

## 2016-08-29 NOTE — Therapy (Signed)
[facility] [address] [place], [place], [address] Phone: [phone]   Fax:  [phone]  Occupational Therapy Treatment  Patient Details  Name: Jon [name] MRN: [id number] Date of Birth: 05-02-1956 Referring Provider: Dr Claudette Laws  Encounter Date: 08/29/2016      OT End of Session - 08/29/16 1242    Visit Number 3   Number of Visits 13   Date for OT Re-Evaluation 10/06/16   Authorization Type UHC [facility])    Authorization Time Period Pt's wife reports he is changing insurance in Feb, Pt is currently scheduled for 10 visits, pt can benefit from saving visits for the future, pt prefers [name]   Authorization - Visit Number 3   Authorization - Number of Visits 20   OT Start Time 1148   OT Stop Time 1230   OT Time Calculation (min) 42 min   Activity Tolerance Patient tolerated treatment well   Behavior During Therapy WFL for tasks assessed/performed      Past Medical History:  Diagnosis Date  . Hypertension   . Liver damage   . Stroke [facility])     Past Surgical History:  Procedure Laterality Date  . TONSILLECTOMY AND ADENOIDECTOMY Bilateral    Age 58    There were no vitals filed for this visit.      Subjective Assessment - 08/29/16 1241    Subjective  My hand is looser   Pertinent History see epic   Limitations Pt reports he sometimes does not want to lie down as he is having urinary problems   Patient Stated Goals Get my left arm going again and have more control of my left foot.   Currently in Pain? No/denies          In sitting, wt. Bearing through L elbow on bench with focus on posture/positioning of L shoulder.  Anterior pelvic tilts, scapular retraction, lateral wt. Shifts and body on arm movements with RUE functional reaching across body with min-mod cues/facilitation.   In sitting, AAROM shoulder flex/elbow ext with UE ranger with min-mod facilitation/cues with focus on  controlled movement and posture.  In supine, AAROM elbow flex (focus and mod cueing on controlled movement), followed by elbow extension with min-mod facilitation.  Then wt. Bearing through L side with partial roll for body on arm movements with min-mod cueing.    Reviewed PROM stretches to wrist and fingers for extension.                     OT Education - 08/29/16 1241    Education Details HEP--see pt instructions   Person(s) Educated Patient;Spouse   Methods Demonstration;Explanation;Handout;Verbal cues   Comprehension Verbalized understanding;Returned demonstration;Verbal cues required;Need further instruction          OT Short Term Goals - 08/23/16 1141      OT SHORT TERM GOAL #1   Title I with updated HEP.   Time 3   Period Weeks   Status New     OT SHORT TERM GOAL #2   Title Pt will consistently attend to left hemibody space with no more than min v.c.   Time 3   Period Weeks   Status New     OT SHORT TERM GOAL #3   Title Pt will use LUE as a stabilizer 10% of the time for ADLs/ IADLs.   Time 3   Period Weeks   Status New     OT SHORT  TERM GOAL #4   Title -------------------------------------------------------------------------------------------------           OT Long Term Goals - 08/23/16 1142      OT LONG TERM GOAL #1   Title Pt will demonstrate ability to perform functional grasp/ release of a cylindrical object with LUE  2/4 trials   Time 6   Period Weeks   Status New     OT LONG TERM GOAL #2   Title Pt will demonstrate 30* shoulder flexion in prep for funtional reach.   Time 6   Period Weeks   Status New     OT LONG TERM GOAL #3   Title Pt will demonstrate 25 % elbow ext in prep for functional reach.   Time 6   Period Weeks   Status New     OT LONG TERM GOAL #4   Title ---------------------------------------------------------------------------------------------------------               Plan - 08/29/16 1243     Clinical Impression Statement Pt denies pain and reports that L hand is a little looser.  However, performance is inconsistent/limited due to decr L side awareness and sensation deficits.   Rehab Potential Fair   Clinical Impairments Affecting Rehab Potential sensation deficits, L inattention, cognitive deficits   OT Frequency 2x / week   OT Duration 6 weeks   OT Treatment/Interventions Self-care/ADL training;Therapeutic exercise;DME and/or AE instruction;Building services engineerunctional Mobility Training;Therapeutic activities;Patient/family education;Splinting;Neuromuscular education;Electrical Stimulation;Passive range of motion;Therapeutic exercises;Visual/perceptual remediation/compensation   Plan neuro re-ed, review HEP   OT Home Exercise Plan Education provided:  08/29/16  HEP   Consulted and Agree with Plan of Care Patient;Family member/caregiver   Family Member Consulted Spouse      Patient will benefit from skilled therapeutic intervention in order to improve the following deficits and impairments:  Abnormal gait, Decreased balance, Decreased endurance, Difficulty walking, Impaired sensation, Impaired vision/preception, Pain, Increased edema, Decreased range of motion, Decreased cognition, Decreased activity tolerance, Decreased coordination, Decreased knowledge of use of DME, Decreased safety awareness, Decreased strength, Impaired UE functional use  Visit Diagnosis: Hemiplegia and hemiparesis following cerebral infarction affecting left dominant side (HCC)  Other symptoms and signs involving cognitive functions following cerebral infarction  Other lack of coordination  Visuospatial deficit    Problem List Patient Active Problem List   Diagnosis Date Noted  . Neuropathic pain of left shoulder 05/19/2016  . Alcohol use disorder, severe, dependence (HCC) 02/15/2016  . Muscle contusion 02/11/2016  . Left flaccid hemiparesis   . Muscle spasticity   . Poor appetite   . Hyperlipidemia 01/14/2016  .  Depression 01/14/2016  . Obesity 01/14/2016  . Hyperglycemia 01/14/2016  . Urinary tract infection, site not specified 01/14/2016  . Basal ganglia hemorrhage (HCC) 01/14/2016  . Hemiparesis affecting nondominant side as late effect of cerebrovascular accident (HCC)   . Cognitive deficit, post-stroke   . Left hemiparesis (HCC)   . Essential hypertension   . Dysphagia, post-stroke   . Gait disturbance, post-stroke   . Hyponatremia   . Thrombocytopenia (HCC)   . Hemorrhagic stroke (HCC) R basal ganglia d/t HTN 01/08/2016    [facility],[name] 08/29/2016, 12:49 PM  [facility] [address] [place], [place], [address] Phone: [phone]   Fax:  [phone]  Name: Tawanna CoolerGeorge D Dauber MRN: [id number] Date of Birth: [birth year]   Willa FraterAngela Freeman, OTR/L [facility] [address] [place], [place]  [address] [phone] phone [phone] 08/29/16 12:54 PM

## 2016-08-29 NOTE — Patient Instructions (Addendum)
  SELF ASSISTED WITH OBJECT: Shoulder Flexion / Elbow Extension (Crutch)    Place one hand on hemi-walker/walker  or other assistive device. Move arm forward, straighten elbow.  Make sure you are sitting up tall. 10 reps per set, 2 sets per day      Lay on your back, try to slowly bend elbow with control to 90*, then try to push elbow straight.  Repeat 10x, 2x/day.

## 2016-08-30 ENCOUNTER — Ambulatory Visit: Payer: 59 | Admitting: Occupational Therapy

## 2016-08-30 DIAGNOSIS — R278 Other lack of coordination: Secondary | ICD-10-CM

## 2016-08-30 DIAGNOSIS — I69318 Other symptoms and signs involving cognitive functions following cerebral infarction: Secondary | ICD-10-CM

## 2016-08-30 DIAGNOSIS — M6281 Muscle weakness (generalized): Secondary | ICD-10-CM | POA: Diagnosis not present

## 2016-08-30 DIAGNOSIS — I69352 Hemiplegia and hemiparesis following cerebral infarction affecting left dominant side: Secondary | ICD-10-CM

## 2016-08-30 DIAGNOSIS — R41842 Visuospatial deficit: Secondary | ICD-10-CM

## 2016-08-30 NOTE — Therapy (Signed)
[facility] [address] [place], [place], [address] Phone: [phone]   Fax:  [phone]  Occupational Therapy Treatment  Patient Details  Name: Jon [name] MRN: [id number] Date of Birth: 1955-08-26 Referring Provider: Dr Claudette Laws  Encounter Date: 08/30/2016      OT End of Session - 08/30/16 1618    Visit Number 4   Number of Visits 13   Date for OT Re-Evaluation 10/06/16   Authorization Type UHC [facility])    Authorization Time Period Pt's wife reports he is changing insurance in Feb, Pt is currently scheduled for 10 visits, pt can benefit from saving visits for the future, pt prefers [name]   Authorization - Visit Number 4   Authorization - Number of Visits 20   OT Start Time 1112   OT Stop Time 1152   OT Time Calculation (min) 40 min   Activity Tolerance Patient tolerated treatment well   Behavior During Therapy WFL for tasks assessed/performed      Past Medical History:  Diagnosis Date  . Hypertension   . Liver damage   . Stroke [facility])     Past Surgical History:  Procedure Laterality Date  . TONSILLECTOMY AND ADENOIDECTOMY Bilateral    Age 61    There were no vitals filed for this visit.      Subjective Assessment - 08/30/16 1616    Subjective  Pt reports lying with hand over stomach at night and that he is not wearing splint   Pertinent History see epic   Limitations Pt reports he sometimes does not want to lie down as he is having urinary problems   Patient Stated Goals Get my left arm going again and have more control of my left foot.   Currently in Pain? No/denies        In supine, AAROM elbow flex (focus and mod cueing on controlled movement), followed by elbow extension with min-mod facilitation.  Then wt. Bearing through L side with partial roll for body on arm movements with min-mod cueing.   PROM and gentle joint mobs to wrist/fingers in extension.  Recommended  pt wear resting hand splint at night or for 2-3hrs during the day if unable to tolerate at night.  Pt reports that he is currently not wearing.  Recommended pt bring splint in to check for fit s/p Botox.  Educated pt in purpose of splint to help with spasticity management.  Pt educated in spasticity management techniques including splinting, stretching, wt. Bearing (in addition to medical management).   Encouraged pt to look to the L as much as possible (particularly when talking to someone positioned on the L), and position frequently used items (water, etc) on the L to improve attention to L side and encourage wt. Shift to the L.  In sitting, light wt. Bearing through L hand on mat with min-mod facilitation.                       OT Education - 08/30/16 1616    Education Details Proper positioning for LUE in supine to decr risk of pain/injury/future complications    Person(s) Educated Patient;Spouse   Methods Explanation;Demonstration;Handout   Comprehension Verbalized understanding          OT Short Term Goals - 08/23/16 1141      OT SHORT TERM GOAL #1   Title I with updated HEP.   Time 3   Period Weeks  Status New     OT SHORT TERM GOAL #2   Title Pt will consistently attend to left hemibody space with no more than min v.c.   Time 3   Period Weeks   Status New     OT SHORT TERM GOAL #3   Title Pt will use LUE as a stabilizer 10% of the time for ADLs/ IADLs.   Time 3   Period Weeks   Status New     OT SHORT TERM GOAL #4   Title -------------------------------------------------------------------------------------------------           OT Long Term Goals - 08/23/16 1142      OT LONG TERM GOAL #1   Title Pt will demonstrate ability to perform functional grasp/ release of a cylindrical object with LUE  2/4 trials   Time 6   Period Weeks   Status New     OT LONG TERM GOAL #2   Title Pt will demonstrate 30* shoulder flexion in prep for funtional  reach.   Time 6   Period Weeks   Status New     OT LONG TERM GOAL #3   Title Pt will demonstrate 25 % elbow ext in prep for functional reach.   Time 6   Period Weeks   Status New     OT LONG TERM GOAL #4   Title ---------------------------------------------------------------------------------------------------------               Plan - 08/30/16 1624    Clinical Impression Statement Pt demo incr AAROM today inconsistently due to cognitive/perceptual deficits.     Rehab Potential Fair   Clinical Impairments Affecting Rehab Potential sensation deficits, L inattention, cognitive deficits   OT Frequency 2x / week   OT Duration 6 weeks   OT Treatment/Interventions Self-care/ADL training;Therapeutic exercise;DME and/or AE instruction;Building services engineer;Therapeutic activities;Patient/family education;Splinting;Neuromuscular education;Electrical Stimulation;Passive range of motion;Therapeutic exercises;Visual/perceptual remediation/compensation   Plan neuro re-ed, review/add to HEP, check splint if pt brings   OT Home Exercise Plan Education provided:  08/29/16  HEP   Consulted and Agree with Plan of Care Patient;Family member/caregiver   Family Member Consulted Spouse      Patient will benefit from skilled therapeutic intervention in order to improve the following deficits and impairments:  Abnormal gait, Decreased balance, Decreased endurance, Difficulty walking, Impaired sensation, Impaired vision/preception, Pain, Increased edema, Decreased range of motion, Decreased cognition, Decreased activity tolerance, Decreased coordination, Decreased knowledge of use of DME, Decreased safety awareness, Decreased strength, Impaired UE functional use  Visit Diagnosis: Hemiplegia and hemiparesis following cerebral infarction affecting left dominant side (HCC)  Other symptoms and signs involving cognitive functions following cerebral infarction  Other lack of  coordination  Visuospatial deficit  Muscle weakness (generalized)    Problem List Patient Active Problem List   Diagnosis Date Noted  . Neuropathic pain of left shoulder 05/19/2016  . Alcohol use disorder, severe, dependence (HCC) 02/15/2016  . Muscle contusion 02/11/2016  . Left flaccid hemiparesis   . Muscle spasticity   . Poor appetite   . Hyperlipidemia 01/14/2016  . Depression 01/14/2016  . Obesity 01/14/2016  . Hyperglycemia 01/14/2016  . Urinary tract infection, site not specified 01/14/2016  . Basal ganglia hemorrhage (HCC) 01/14/2016  . Hemiparesis affecting nondominant side as late effect of cerebrovascular accident (HCC)   . Cognitive deficit, post-stroke   . Left hemiparesis (HCC)   . Essential hypertension   . Dysphagia, post-stroke   . Gait disturbance, post-stroke   . Hyponatremia   . Thrombocytopenia (  HCC)   . Hemorrhagic stroke (HCC) R basal ganglia d/t HTN 01/08/2016    [place] Springs[place]REEMAN,[name] 08/30/2016, 4:46 PM  [facility] [address] [place], [place], [address] Phone: [phone]   Fax:  [phone]  Name: Tawanna CoolerGeorge D Wach MRN: [id number] Date of Birth: October 01, 1955   Willa FraterAngela Freeman, OTR/L [facility] [address] [place], [place]  [address] [phone] 08/30/16 4:46 PM

## 2016-08-31 ENCOUNTER — Encounter: Payer: Self-pay | Admitting: *Deleted

## 2016-09-01 ENCOUNTER — Ambulatory Visit: Payer: 59 | Admitting: Neurology

## 2016-09-05 ENCOUNTER — Ambulatory Visit: Payer: 59 | Admitting: Occupational Therapy

## 2016-09-05 ENCOUNTER — Encounter: Payer: Self-pay | Admitting: Occupational Therapy

## 2016-09-05 ENCOUNTER — Encounter: Payer: Self-pay | Admitting: *Deleted

## 2016-09-05 VITALS — BP 116/64 | HR 65

## 2016-09-05 DIAGNOSIS — I69318 Other symptoms and signs involving cognitive functions following cerebral infarction: Secondary | ICD-10-CM

## 2016-09-05 DIAGNOSIS — R41842 Visuospatial deficit: Secondary | ICD-10-CM | POA: Diagnosis not present

## 2016-09-05 DIAGNOSIS — R278 Other lack of coordination: Secondary | ICD-10-CM

## 2016-09-05 DIAGNOSIS — M6281 Muscle weakness (generalized): Secondary | ICD-10-CM | POA: Diagnosis not present

## 2016-09-05 DIAGNOSIS — I69352 Hemiplegia and hemiparesis following cerebral infarction affecting left dominant side: Secondary | ICD-10-CM

## 2016-09-05 NOTE — Patient Instructions (Signed)
Your Splint This splint should initially be fitted by a healthcare practitioner.  The healthcare practitioner is responsible for providing wearing instructions and precautions to the patient, other healthcare practitioners and care provider involved in the patient's care.  This splint was custom made for you. Please read the following instructions to learn about wearing and caring for your splint.  Precautions Should your splint cause any of the following problems, remove the splint immediately and contact your therapist/physician.  (Remove and bring splint with you to therapy).  Swelling  Severe Pain  Pressure Areas  Stiffness  Numbness  Do not wear your splint while operating machinery unless it has been fabricated for that purpose.  When To Wear Your Splint Where your splint according to your therapist/physician instructions. Daytime for 2-3 hours for first 2 days, check skin after an hour.  Slowly incr wear time to 5 hours.  Once you can wear for at least 5 hours without problems, begin wearing at night.  Care and Cleaning of Your Splint 1. Keep your splint away from open flames. 2. Your splint will lose its shape in temperatures over 135 degrees Farenheit, ( in car windows, near radiators, ovens or in hot water).  Never make any adjustments to your splint, if the splint needs adjusting remove it and make an appointment to see your therapist. 3. Your splint, including the cushion liner may be cleaned with soap and lukewarm water.  Do not immerse in hot water over 135 degrees Farenheit. 4. Straps may be washed with soap and water, but do not moisten the self-adhesive portion. 5. For ink or hard to remove spots use a scouring cleanser which contains chlorine.  Rinse the splint thoroughly after using chlorine cleanser.

## 2016-09-05 NOTE — Therapy (Signed)
[facility] [address] [place], [place], [address] Phone: [phone]   Fax:  [phone]  Occupational Therapy Treatment  Patient Details  Name: Jon CoolerGeorge D Ragin MRN: [id number] Date of Birth: 12/22/1955 Referring Provider: Dr Claudette LawsAndrew Kirsteins  Encounter Date: 09/05/2016      OT End of Session - 09/05/16 1426    Visit Number 5   Number of Visits 13   Date for OT Re-Evaluation 10/06/16   Authorization Type [employer] [facility]([employer])    Authorization Time Period Pt's wife reports he is changing insurance in Feb, Pt is currently scheduled for 10 visits, pt can benefit from saving visits for the future, pt prefers [name]   Authorization - Visit Number 5   Authorization - Number of Visits 20   OT Start Time 1318   OT Stop Time 1405   OT Time Calculation (min) 47 min   Activity Tolerance Patient tolerated treatment well   Behavior During Therapy WFL for tasks assessed/performed      Past Medical History:  Diagnosis Date  . Hypertension   . Liver damage   . Stroke [facility](HCC)     Past Surgical History:  Procedure Laterality Date  . TONSILLECTOMY AND ADENOIDECTOMY Bilateral    Age 61    Vitals:   09/05/16 1323  BP: 116/64  Pulse: 65        Subjective Assessment - 09/05/16 1424    Subjective  Pt reports that he has been a little dizzy when he stands up.  No other symptoms reported.   Pertinent History see epic   Limitations Pt reports he sometimes does not want to lie down as he is having urinary problems   Patient Stated Goals Get my left arm going again and have more control of my left foot.   Currently in Pain? No/denies         Checked fit of current (pre-fab splint).  Splint positions hand with MPs in ext and allows PIP flex and rotation of 4-5th digits.  Discussed that custom splint would improve position and help manage spasticity better.  Pt verbalized understanding/agreement.  Fabricated custom  resting hand splint for LUE.                       OT Education - 09/05/16 1425    Education Details Splint wear/care and precautions; Rationale of splint and why recommend custom for improved fit/positioning   Person(s) Educated Patient;Spouse   Methods Explanation;Demonstration;Verbal cues;Handout   Comprehension Verbalized understanding          OT Short Term Goals - 08/23/16 1141      OT SHORT TERM GOAL #1   Title I with updated HEP.   Time 3   Period Weeks   Status New     OT SHORT TERM GOAL #2   Title Pt will consistently attend to left hemibody space with no more than min v.c.   Time 3   Period Weeks   Status New     OT SHORT TERM GOAL #3   Title Pt will use LUE as a stabilizer 10% of the time for ADLs/ IADLs.   Time 3   Period Weeks   Status New     OT SHORT TERM GOAL #4   Title -------------------------------------------------------------------------------------------------           OT Long Term Goals - 08/23/16 1142      OT LONG TERM GOAL #1  Title Pt will demonstrate ability to perform functional grasp/ release of a cylindrical object with LUE  2/4 trials   Time 6   Period Weeks   Status New     OT LONG TERM GOAL #2   Title Pt will demonstrate 30* shoulder flexion in prep for funtional reach.   Time 6   Period Weeks   Status New     OT LONG TERM GOAL #3   Title Pt will demonstrate 25 % elbow ext in prep for functional reach.   Time 6   Period Weeks   Status New     OT LONG TERM GOAL #4   Title ---------------------------------------------------------------------------------------------------------               Plan - 09/05/16 1427    Clinical Impression Statement Pt/wife verbalized understanding of splint wear/care today after instruction.  Pt with improved positioning/fit with custom splint vs. pre-fab splint.   Rehab Potential Fair   Clinical Impairments Affecting Rehab Potential sensation deficits, L  inattention, cognitive deficits   OT Frequency 2x / week   OT Duration 6 weeks   OT Treatment/Interventions Self-care/ADL training;Therapeutic exercise;DME and/or AE instruction;Building services engineer;Therapeutic activities;Patient/family education;Splinting;Neuromuscular education;Electrical Stimulation;Passive range of motion;Therapeutic exercises;Visual/perceptual remediation/compensation   Plan neuro re-ed, review/add to HEP; check new splint prn.   OT Home Exercise Plan Education provided:  08/29/16  HEP   Consulted and Agree with Plan of Care Patient;Family member/caregiver   Family Member Consulted Spouse      Patient will benefit from skilled therapeutic intervention in order to improve the following deficits and impairments:  Abnormal gait, Decreased balance, Decreased endurance, Difficulty walking, Impaired sensation, Impaired vision/preception, Pain, Increased edema, Decreased range of motion, Decreased cognition, Decreased activity tolerance, Decreased coordination, Decreased knowledge of use of DME, Decreased safety awareness, Decreased strength, Impaired UE functional use  Visit Diagnosis: Hemiplegia and hemiparesis following cerebral infarction affecting left dominant side (HCC)  Other symptoms and signs involving cognitive functions following cerebral infarction  Other lack of coordination  Visuospatial deficit    Problem List Patient Active Problem List   Diagnosis Date Noted  . Neuropathic pain of left shoulder 05/19/2016  . Alcohol use disorder, severe, dependence (HCC) 02/15/2016  . Muscle contusion 02/11/2016  . Left flaccid hemiparesis   . Muscle spasticity   . Poor appetite   . Hyperlipidemia 01/14/2016  . Depression 01/14/2016  . Obesity 01/14/2016  . Hyperglycemia 01/14/2016  . Urinary tract infection, site not specified 01/14/2016  . Basal ganglia hemorrhage (HCC) 01/14/2016  . Hemiparesis affecting nondominant side as late effect of  cerebrovascular accident (HCC)   . Cognitive deficit, post-stroke   . Left hemiparesis (HCC)   . Essential hypertension   . Dysphagia, post-stroke   . Gait disturbance, post-stroke   . Hyponatremia   . Thrombocytopenia (HCC)   . Hemorrhagic stroke (HCC) R basal ganglia d/t HTN 01/08/2016    [facility] 09/05/2016, 2:30 PM   [facility] [address] [place], [place], [address] Phone: [phone]   Fax:  [phone]  Name: JOMARION MISH MRN: [id number] Date of Birth: 1956-06-26   Willa Frater, OTR/L [facility] [address] [place], [place]  [phone] phone [phone] 09/05/16 2:30 PM

## 2016-09-06 ENCOUNTER — Ambulatory Visit: Payer: 59 | Admitting: Occupational Therapy

## 2016-09-06 DIAGNOSIS — R41842 Visuospatial deficit: Secondary | ICD-10-CM

## 2016-09-06 DIAGNOSIS — I69318 Other symptoms and signs involving cognitive functions following cerebral infarction: Secondary | ICD-10-CM | POA: Diagnosis not present

## 2016-09-06 DIAGNOSIS — R278 Other lack of coordination: Secondary | ICD-10-CM

## 2016-09-06 DIAGNOSIS — I69352 Hemiplegia and hemiparesis following cerebral infarction affecting left dominant side: Secondary | ICD-10-CM | POA: Diagnosis not present

## 2016-09-06 DIAGNOSIS — M6281 Muscle weakness (generalized): Secondary | ICD-10-CM | POA: Diagnosis not present

## 2016-09-06 NOTE — Therapy (Signed)
[facility] [address] [place], [place], [address] Phone: [phone]   Fax:  [phone]  Occupational Therapy Treatment  Patient Details  Name: Jon [name] MRN: [id number] Date of Birth: 10-18-1955 Referring Provider: Dr Jon [name]  Encounter Date: 09/06/2016      OT End of Session - 09/06/16 1003    Visit Number 6   Number of Visits 13   Date for OT Re-Evaluation 10/06/16   Authorization Type [employer] [facility])    Authorization Time Period Pt's wife reports he is changing insurance in Feb, Pt is currently scheduled for 10 visits, pt can benefit from saving visits for the future, pt prefers Jon B./ Jon [name]   Authorization - Visit Number 6   Authorization - Number of Visits 20   OT Start Time 0850   OT Stop Time 0931   OT Time Calculation (min) 41 min   Activity Tolerance Patient tolerated treatment well   Behavior During Therapy [facility] for tasks assessed/performed      Past Medical History:  Diagnosis Date  . Hypertension   . Liver damage   . Stroke [facility])     Past Surgical History:  Procedure Laterality Date  . TONSILLECTOMY AND ADENOIDECTOMY Bilateral    Age 61    There were no vitals filed for this visit.      Subjective Assessment - 09/06/16 0957    Subjective  Pt reports no problems with his splint, pt / wife were instructed that if pt has any problems to stop wearing splint and bring it to next appt.   Patient is accompained by: Family member   Pertinent History see epic   Limitations Pt reports he sometimes does not want to lie down as he is having urinary problems   Patient Stated Goals Get my left arm going again and have more control of my left foot.   Currently in Pain? No/denies            Treatment:Seated at table NMES to wrist and finger extensors, 50 pps, 250 pw, 10 secs cycle intensity 25, x 10 mins with therapist facilitating finger and wrist ext during on cycle.  No adverse reactions. Supine, gentle P/ROM to left wrist and digits, AA/ROM elbow flexion/ extension, self ROM shoulder flexion with therapist facilitating scapula Seated weightbearing through left hand edge of mat, mod facilitation then AA/ROM shoulder flexion and elbow flexion ext with hand supported on hemiwalker, mod facilitation, v.c.                  OT Education - 09/05/16 1425    Education Details Splint wear/care and precautions; Rationale of splint and why recommend custom for improved fit/positioning   Person(s) Educated Patient;Spouse   Methods Explanation;Demonstration;Verbal cues;Handout   Comprehension Verbalized understanding          OT Short Term Goals - 08/23/16 1141      OT SHORT TERM GOAL #1   Title I with updated HEP.   Time 3   Period Weeks   Status New     OT SHORT TERM GOAL #2   Title Pt will consistently attend to left hemibody space with no more than min v.c.   Time 3   Period Weeks   Status New     OT SHORT TERM GOAL #3   Title Pt will use LUE as a stabilizer 10% of the time for ADLs/ IADLs.   Time 3   Period Weeks  Status New     OT SHORT TERM GOAL #4   Title -------------------------------------------------------------------------------------------------           OT Long Term Goals - 08/23/16 1142      OT LONG TERM GOAL #1   Title Pt will demonstrate ability to perform functional grasp/ release of a cylindrical object with LUE  2/4 trials   Time 6   Period Weeks   Status New     OT LONG TERM GOAL #2   Title Pt will demonstrate 30* shoulder flexion in prep for funtional reach.   Time 6   Period Weeks   Status New     OT LONG TERM GOAL #3   Title Pt will demonstrate 25 % elbow ext in prep for functional reach.   Time 6   Period Weeks   Status New     OT LONG TERM GOAL #4   Title ---------------------------------------------------------------------------------------------------------                Plan - 09/06/16 1002    Clinical Impression Statement Pt / wife report no problems with resting hand splint. Pt is progressing slowly towards goals.   Rehab Potential Fair   Clinical Impairments Affecting Rehab Potential sensation deficits, L inattention, cognitive deficits   OT Frequency 2x / week   OT Duration 6 weeks   OT Treatment/Interventions Self-care/ADL training;Therapeutic exercise;DME and/or AE instruction;Building services engineerunctional Mobility Training;Therapeutic activities;Patient/family education;Splinting;Neuromuscular education;Electrical Stimulation;Passive range of motion;Therapeutic exercises;Visual/perceptual remediation/compensation   Plan neuro re-ed, progress HEP, check to see how splint wear is going   OT Home Exercise Plan Education provided:  08/29/16  HEP   Consulted and Agree with Plan of Care Patient;Family member/caregiver   Family Member Consulted Spouse      Patient will benefit from skilled therapeutic intervention in order to improve the following deficits and impairments:  Abnormal gait, Decreased balance, Decreased endurance, Difficulty walking, Impaired sensation, Impaired vision/preception, Pain, Increased edema, Decreased range of motion, Decreased cognition, Decreased activity tolerance, Decreased coordination, Decreased knowledge of use of DME, Decreased safety awareness, Decreased strength, Impaired UE functional use  Visit Diagnosis: Hemiplegia and hemiparesis following cerebral infarction affecting left dominant side (HCC)  Other lack of coordination  Visuospatial deficit    Problem List Patient Active Problem List   Diagnosis Date Noted  . Neuropathic pain of left shoulder 05/19/2016  . Alcohol use disorder, severe, dependence (HCC) 02/15/2016  . Muscle contusion 02/11/2016  . Left flaccid hemiparesis   . Muscle spasticity   . Poor appetite   . Hyperlipidemia 01/14/2016  . Depression 01/14/2016  . Obesity 01/14/2016  . Hyperglycemia 01/14/2016  .  Urinary tract infection, site not specified 01/14/2016  . Basal ganglia hemorrhage (HCC) 01/14/2016  . Hemiparesis affecting nondominant side as late effect of cerebrovascular accident (HCC)   . Cognitive deficit, post-stroke   . Left hemiparesis (HCC)   . Essential hypertension   . Dysphagia, post-stroke   . Gait disturbance, post-stroke   . Hyponatremia   . Thrombocytopenia (HCC)   . Hemorrhagic stroke (HCC) R basal ganglia d/t HTN 01/08/2016    RINE,KATHRYN 09/06/2016, 10:04 AM  [facility] [address] [place], [place], [address] Phone: [phone]   Fax:  [phone]  Name: Tawanna CoolerGeorge D Davidoff MRN: [id number] Date of Birth: [birth year]

## 2016-09-07 ENCOUNTER — Encounter: Payer: Self-pay | Admitting: *Deleted

## 2016-09-13 ENCOUNTER — Encounter: Payer: Self-pay | Admitting: Occupational Therapy

## 2016-09-13 ENCOUNTER — Encounter: Payer: BLUE CROSS/BLUE SHIELD | Attending: Physical Medicine & Rehabilitation

## 2016-09-13 ENCOUNTER — Encounter: Payer: Self-pay | Admitting: Physical Medicine & Rehabilitation

## 2016-09-13 ENCOUNTER — Ambulatory Visit (HOSPITAL_BASED_OUTPATIENT_CLINIC_OR_DEPARTMENT_OTHER): Payer: 59 | Admitting: Physical Medicine & Rehabilitation

## 2016-09-13 ENCOUNTER — Ambulatory Visit: Payer: 59 | Admitting: *Deleted

## 2016-09-13 VITALS — BP 119/66 | HR 54 | Resp 14

## 2016-09-13 DIAGNOSIS — Z5189 Encounter for other specified aftercare: Secondary | ICD-10-CM | POA: Insufficient documentation

## 2016-09-13 DIAGNOSIS — I10 Essential (primary) hypertension: Secondary | ICD-10-CM | POA: Diagnosis not present

## 2016-09-13 DIAGNOSIS — I629 Nontraumatic intracranial hemorrhage, unspecified: Secondary | ICD-10-CM | POA: Diagnosis not present

## 2016-09-13 DIAGNOSIS — I610 Nontraumatic intracerebral hemorrhage in hemisphere, subcortical: Secondary | ICD-10-CM | POA: Diagnosis not present

## 2016-09-13 DIAGNOSIS — F1010 Alcohol abuse, uncomplicated: Secondary | ICD-10-CM | POA: Insufficient documentation

## 2016-09-13 DIAGNOSIS — G8194 Hemiplegia, unspecified affecting left nondominant side: Secondary | ICD-10-CM | POA: Insufficient documentation

## 2016-09-13 DIAGNOSIS — R252 Cramp and spasm: Secondary | ICD-10-CM | POA: Diagnosis not present

## 2016-09-13 DIAGNOSIS — I639 Cerebral infarction, unspecified: Secondary | ICD-10-CM | POA: Diagnosis not present

## 2016-09-13 NOTE — Patient Instructions (Signed)
Recommend reduce baclofen to 20 mg 3 times a day. Hopefully, that will help with your mental clarity. Also may help somewhat with dizziness. If you continue to have dizziness. We may have to make further dose reductions or you may need to contact her primary physician to adjust her blood pressure medications.

## 2016-09-13 NOTE — Progress Notes (Signed)
Subjective:    Patient ID: Jon [name], male    DOB: 1955-10-03, 61 y.o.   MRN: [id number]  HPI 61 year old male with history of right basal ganglia hemorrhage onset in June 2017. Prior history of alcoholism but has not been drinking alcohol since his stroke. Has been attending outpatient physical therapy. Spasticity left upper extremity greater than left lower extremity. Having only partial response to baclofen. Also complaining of some dizziness mainly in the mornings.  Underwent Botox injection 6 weeks ago following muscles FCR25 FCU0 FDS25 FDP25 FPL25  Patient satisfied with results Continues to undergo occupational therapy  Pain Inventory Average Pain 0 Pain Right Now 0 My pain is no pain  In the last 24 hours, has pain interfered with the following? General activity 0 Relation with others 0 Enjoyment of life 0 What TIME of day is your pain at its worst? no pain Sleep (in general) Good  Pain is worse with: no pain Pain improves with: no pain Relief from Meds: no pain  Mobility walk with assistance use a walker how many minutes can you walk? 15 ability to climb steps?  no do you drive?  no use a wheelchair transfers alone Do you have any goals in this area?  yes  Function disabled: date disabled . I need assistance with the following:  meal prep, household duties and shopping  Neuro/Psych bladder control problems weakness trouble walking dizziness confusion  Prior Studies Any changes since last visit?  no  Physicians involved in your care Any changes since last visit?  no   History reviewed. No pertinent family history. Social History   Social History  . Marital status: Married    Spouse name: N/A  . Number of children: N/A  . Years of education: N/A   Social History Main Topics  . Smoking status: Former Smoker    Packs/day: 0.50    Years: 19.00    Types: Cigarettes    Quit date: 02/22/2000  . Smokeless tobacco: Never Used  .  Alcohol use No     Comment: Wife reports chronic alcohol use. 01/12/16  . Drug use: No     Comment: teens to early 20's  . Sexual activity: Not Asked   Other Topics Concern  . None   Social History Narrative  . None   Past Surgical History:  Procedure Laterality Date  . TONSILLECTOMY AND ADENOIDECTOMY Bilateral    Age 36   Past Medical History:  Diagnosis Date  . Hypertension   . Liver damage   . Stroke [facility])    There were no vitals taken for this visit.  Opioid Risk Score:   Fall Risk Score:  `1  Depression screen PHQ 2/9  Depression screen [facility] 2/9 05/19/2016 04/21/2016  Decreased Interest 0 0  Down, Depressed, Hopeless 0 0  PHQ - 2 Score 0 0    Review of Systems  HENT: Negative.   Eyes: Negative.   Respiratory: Negative.   Cardiovascular: Negative.   Gastrointestinal: Negative.   Endocrine: Negative.   Genitourinary: Positive for difficulty urinating.  Musculoskeletal: Positive for gait problem.  Skin: Negative.   Allergic/Immunologic: Negative.   Neurological: Positive for dizziness and weakness.  Hematological: Negative.   Psychiatric/Behavioral: Positive for confusion.  All other systems reviewed and are negative.      Objective:   Physical Exam  Constitutional: He is oriented to person, place, and time. He appears well-developed and well-nourished.  HENT:  Head: Normocephalic and atraumatic.  Eyes: Pupils are  equal, round, and reactive to light.  Neurological: He is alert and oriented to person, place, and time.  Left neglect when presented double simultaneous visual stimuli  Psychiatric: He has a normal mood and affect.  Nursing note and vitals reviewed.   MAS 3 wrist flexion MAS 2 FF MAS 1 FPL  Motor strength to minus. Left deltoid, 2 minus, left biceps, 0 at the finger extensors trace finger flexors. Left lower extremity 3 minus. Hip flexion 4 minus, hip, knee extensor synergy. Trace ankle     Assessment & Plan:  1. Right basal ganglia  hemorrhage with residual left spastic hemiparesis. Spasticity improved after botulinum toxin injection. Would recommend current dose in 6 weeks. Recommend reduction of baclofen as he may be experiencing some side effects.  Continue OT  2. History of hypertension. Blood pressure running a little on the low side, recommend he follows up with primary care to see if blood pressure medications need to be adjusted.

## 2016-09-14 ENCOUNTER — Ambulatory Visit: Payer: 59 | Admitting: Occupational Therapy

## 2016-09-14 DIAGNOSIS — R278 Other lack of coordination: Secondary | ICD-10-CM

## 2016-09-14 DIAGNOSIS — I69318 Other symptoms and signs involving cognitive functions following cerebral infarction: Secondary | ICD-10-CM

## 2016-09-14 DIAGNOSIS — M6281 Muscle weakness (generalized): Secondary | ICD-10-CM | POA: Diagnosis not present

## 2016-09-14 DIAGNOSIS — R41842 Visuospatial deficit: Secondary | ICD-10-CM

## 2016-09-14 DIAGNOSIS — I69352 Hemiplegia and hemiparesis following cerebral infarction affecting left dominant side: Secondary | ICD-10-CM | POA: Diagnosis not present

## 2016-09-14 NOTE — Patient Instructions (Addendum)
SELF ASSISTED WITH OBJECT: Shoulder Flexion / Elbow Extension (Crutch)    Place one hand on hemi-walker/walker  or other assistive device. Move arm forward, straighten elbow.  Make sure you are sitting up tall. 10 reps per set, 2 sets per day.  May need to wrap ace bandage loosely around hand to help it stay on hemi-walker.      Lay on your back, try to slowly bend elbow with control to 90*, then try to push elbow straight.  Repeat 10x, 2x/day.    .  Weight Bearing Hand Sit    Place hands flat at sides. Lean body weight to left side (can reach with the right hand slowly keeping left elbow straight and shoulder in alignment). Hold 5 seconds. Repeat 10 times. Do 1 sessions per day.   Lay on your back, left arm by your side.  Reach across your body with your right arm with a partial roll.  Hold 5 sec.  Repeat 10 times.   Lay on your back, hold left wrist with right hand.  Keep elbows straight, slowly raise arm to chin height.  10x.    Gently stretch fingers to straighten.  Then gently stretch wrist up.  Hold 5 sec.  10x for each.

## 2016-09-14 NOTE — Therapy (Signed)
[facility] [address] [place], [place], [address] Phone: [phone]   Fax:  [phone]  Occupational Therapy Treatment  Patient Details  Name: Jon [name] MRN: [id number] Date of Birth: 1955-11-07 Referring Provider: Dr Claudette Laws  Encounter Date: 09/14/2016      OT End of Session - 09/14/16 1914    Visit Number 7   Number of Visits 13   Date for OT Re-Evaluation 10/06/16   Authorization Type [employer] [facility])    Authorization Time Period Pt's wife reports he is changing insurance in Feb, Pt is currently scheduled for 10 visits, pt can benefit from saving visits for the future, pt prefers [name]/ Jennelle Pinkstaff   Authorization - Visit Number 7   Authorization - Number of Visits 20   OT Start Time 1320   OT Stop Time 1405   OT Time Calculation (min) 45 min   Activity Tolerance Patient tolerated treatment well   Behavior During Therapy [facility] for tasks assessed/performed      Past Medical History:  Diagnosis Date  . Hypertension   . Liver damage   . Stroke [facility])     Past Surgical History:  Procedure Laterality Date  . TONSILLECTOMY AND ADENOIDECTOMY Bilateral    Age 61    There were no vitals filed for this visit.      Subjective Assessment - 09/14/16 1911    Subjective  Pt reports no problems with splint and has been wearing for 3-4hrs   Patient is accompained by: Family member   Pertinent History see epic   Limitations Pt reports he sometimes does not want to lie down as he is having urinary problems   Patient Stated Goals Get my left arm going again and have more control of my left foot.   Currently in Pain? No/denies       In supine, AAROM and AROM wrist ext with min-mod cueing to isolate wrist without incr spasticity/flexor synergy pattern.  Emphasized importance of turning to the L and watching LUE due to L inattention and decr sensation.    Reviewed purpose of Botox per pt  questions.    Emphasized slow, controlled movements to decr flexor synergy patterns.   Reviewed previous updated HEP and added to HEP--see education section/pt instructions.                       OT Education - 09/14/16 1912    Education Details Updates to HEP--see pt instructions; importance of using vision and looking toward L side during HEP to incr attention/awareness   Person(s) Educated Patient;Spouse   Methods Explanation;Demonstration;Verbal cues;Handout;Tactile cues   Comprehension Verbalized understanding;Returned demonstration;Verbal cues required;Tactile cues required;Need further instruction  min-mod cueing          OT Short Term Goals - 09/14/16 1917      OT SHORT TERM GOAL #1   Title I with updated HEP.   Time 3   Period Weeks   Status On-going  09/14/16  needs mod cues     OT SHORT TERM GOAL #2   Title Pt will consistently attend to left hemibody space with no more than min v.c.   Time 3   Period Weeks   Status On-going  09/14/16  needs mod cueing     OT SHORT TERM GOAL #3   Title Pt will use LUE as a stabilizer 10% of the time for ADLs/ IADLs.   Time 3   Period  Weeks   Status New     OT SHORT TERM GOAL #4   Title -------------------------------------------------------------------------------------------------           OT Long Term Goals - 08/23/16 1142      OT LONG TERM GOAL #1   Title Pt will demonstrate ability to perform functional grasp/ release of a cylindrical object with LUE  2/4 trials   Time 6   Period Weeks   Status New     OT LONG TERM GOAL #2   Title Pt will demonstrate 30* shoulder flexion in prep for funtional reach.   Time 6   Period Weeks   Status New     OT LONG TERM GOAL #3   Title Pt will demonstrate 25 % elbow ext in prep for functional reach.   Time 6   Period Weeks   Status New     OT LONG TERM GOAL #4   Title  ---------------------------------------------------------------------------------------------------------               Plan - 09/14/16 1915    Clinical Impression Statement Pt progressing slowly towards goals, but cognitive and sensation deficits as well as L inattention remain barriers.  Pt needs cueing/prompting for attention, L inattention throughout session/during HEP.  Pt demo incr ability to isolate wrist extension and elbow ext with repetition, facilitation, cueing for attention.   Rehab Potential Fair   Clinical Impairments Affecting Rehab Potential sensation deficits, L inattention, cognitive deficits   OT Frequency 2x / week   OT Duration 6 weeks   OT Treatment/Interventions Self-care/ADL training;Therapeutic exercise;DME and/or AE instruction;Building services engineerunctional Mobility Training;Therapeutic activities;Patient/family education;Splinting;Neuromuscular education;Electrical Stimulation;Passive range of motion;Therapeutic exercises;Visual/perceptual remediation/compensation   Plan neuro re-ed, check STG#3 and discuss ways to incorporate LUE at home   OT Home Exercise Plan Education provided:  08/29/16  HEP   Consulted and Agree with Plan of Care Patient;Family member/caregiver   Family Member Consulted Spouse      Patient will benefit from skilled therapeutic intervention in order to improve the following deficits and impairments:  Abnormal gait, Decreased balance, Decreased endurance, Difficulty walking, Impaired sensation, Impaired vision/preception, Pain, Increased edema, Decreased range of motion, Decreased cognition, Decreased activity tolerance, Decreased coordination, Decreased knowledge of use of DME, Decreased safety awareness, Decreased strength, Impaired UE functional use  Visit Diagnosis: Hemiplegia and hemiparesis following cerebral infarction affecting left dominant side (HCC)  Other lack of coordination  Visuospatial deficit  Other symptoms and signs involving cognitive  functions following cerebral infarction    Problem List Patient Active Problem List   Diagnosis Date Noted  . Neuropathic pain of left shoulder 05/19/2016  . Alcohol use disorder, severe, dependence (HCC) 02/15/2016  . Muscle contusion 02/11/2016  . Left flaccid hemiparesis   . Muscle spasticity   . Poor appetite   . Hyperlipidemia 01/14/2016  . Depression 01/14/2016  . Obesity 01/14/2016  . Hyperglycemia 01/14/2016  . Urinary tract infection, site not specified 01/14/2016  . Basal ganglia hemorrhage (HCC) 01/14/2016  . Hemiparesis affecting nondominant side as late effect of cerebrovascular accident (HCC)   . Cognitive deficit, post-stroke   . Left hemiparesis (HCC)   . Essential hypertension   . Dysphagia, post-stroke   . Gait disturbance, post-stroke   . Hyponatremia   . Thrombocytopenia (HCC)   . Hemorrhagic stroke (HCC) R basal ganglia d/t HTN 01/08/2016    [facility],Zolton Dowson 09/14/2016, 7:24 PM   [facility] [address] [place], [place], [address] Phone: [phone]   Fax:  [phone]  Name: JAMY CLECKLER MRN: [id number] Date of Birth: 1955-08-27   Willa Frater, OTR/L [facility] [address] [place], [place]  [address] [phone] phone [phone] 09/14/16 7:24 PM

## 2016-09-15 ENCOUNTER — Encounter: Payer: Self-pay | Admitting: *Deleted

## 2016-09-15 ENCOUNTER — Ambulatory Visit: Payer: 59 | Admitting: *Deleted

## 2016-09-15 DIAGNOSIS — M6281 Muscle weakness (generalized): Secondary | ICD-10-CM | POA: Diagnosis not present

## 2016-09-15 DIAGNOSIS — R41842 Visuospatial deficit: Secondary | ICD-10-CM

## 2016-09-15 DIAGNOSIS — I69352 Hemiplegia and hemiparesis following cerebral infarction affecting left dominant side: Secondary | ICD-10-CM

## 2016-09-15 DIAGNOSIS — I69318 Other symptoms and signs involving cognitive functions following cerebral infarction: Secondary | ICD-10-CM

## 2016-09-15 DIAGNOSIS — R278 Other lack of coordination: Secondary | ICD-10-CM

## 2016-09-15 NOTE — Therapy (Signed)
[facility] [address] [place], [place], [address] Phone: [phone]   Fax:  [phone]  Occupational Therapy Treatment  Patient Details  Name: Jon [name] MRN: [id number] Date of Birth: November 20, 1955 Referring Provider: Dr Alysia Penna  Encounter Date: 09/15/2016      OT End of Session - 09/15/16 1209    Visit Number 8   Number of Visits 13   Date for OT Re-Evaluation 10/06/16   Authorization Type [employer] [facility])    Authorization Time Period Pt's wife reports he is changing insurance in Feb, Pt is currently scheduled for 10 visits, pt can benefit from saving visits for the future, pt prefers [name]/ angela   Authorization - Visit Number 8   Authorization - Number of Visits 20   OT Start Time 1102   OT Stop Time 1149   OT Time Calculation (min) 47 min   Activity Tolerance Patient tolerated treatment well   Behavior During Therapy [facility] for tasks assessed/performed      Past Medical History:  Diagnosis Date  . Hypertension   . Liver damage   . Stroke [facility])     Past Surgical History:  Procedure Laterality Date  . TONSILLECTOMY AND ADENOIDECTOMY Bilateral    Age 61    There were no vitals filed for this visit.      Subjective Assessment - 09/15/16 1110    Subjective  Pt reports wearing splint daily 3-4 hours at a time. Pt reports pain in LE's at noc, denies pain at this time.   Patient is accompained by: Family member   Pertinent History see epic   Limitations Pt reports he sometimes does not want to lie down as he is having urinary problems   Patient Stated Goals Get my left arm going again and have more control of my left foot.   Currently in Pain? No/denies   Pain Score 0-No pain                      OT Treatments/Exercises (OP) - 09/15/16 0001      ADLs   ADL Comments Discussed LUE awareness vs inattention as well as use of LUE as stabilizer during ADL's/IADL's. Pt  states that he uses L UE to assist with holding toothbrush when applying toothpaste with R UE; Placing a dinner plate in left hand (supinated) and under his thumb in his lap, when taking a plate to the dishwasher etc. Also discussed placing items under forearm or upper arm to assist with holding/stabilizing to open soda/water. Pt also reports doing this during ADL's/bathing and dressing. He reports that he is doing all bathing and dressing Mod I at this time including gathering items from dresser etc. Pt has met STG #3.     Exercises   Exercises Elbow;Wrist;Hand;Neurological Re-education     Shoulder Exercises: Seated   Other Seated Exercises Seated at table for gentle P/ROM to digits, wrist and elbow. Focus on finger extension;/flexion; wrist flexion/extension; composite fist w/ wrist extension and finger extension with wrist flexion. Gentle P/ROM L elbow for flexion/extension, pronaiton/supination.     Neurological Re-education Exercises   Other Information Pt states that he can lift LUE onto arm rest from his lap and demonstrates in clinic followed by actively lifting his LUE and placing back in his lap. Pt reports "I am getting better at this"   Other Exercises 1 WB LUE: Elbow/shoulder in sitting   Other Exercises 2 Gentle PROM  to left shoulder; hand, wrist, forearm, digits LUE.     Manual Therapy   Manual Therapy Soft tissue mobilization;Myofascial release   Myofascial Release Trigger point release and myofacial techniques LUE w/ focus on rotator cuff and deltoid x8 min                OT Education - 09/15/16 1207    Education provided Yes   Education Details Reviewed using vision for LUE awareness to assist with increased attention. Use of LUE as stabilizer during ADL's/IADL's.   Person(s) Educated Patient;Spouse   Methods Explanation;Demonstration;Verbal cues;Tactile cues   Comprehension Verbalized understanding;Verbal cues required;Tactile cues required;Need further  instruction;Returned demonstration          OT Short Term Goals - 09/15/16 1213      OT SHORT TERM GOAL #1   Title I with updated HEP.   Baseline VC's   Time 3   Period Weeks   Status On-going  Needs Mod cues     OT SHORT TERM GOAL #2   Title Pt will consistently attend to left hemibody space with no more than min v.c.   Baseline VC's and tc's   Time 3   Status On-going  Needs Mod Cuing     OT SHORT TERM GOAL #3   Title Pt will use LUE as a stabilizer 10% of the time for ADLs/ IADLs.   Baseline VC's/Min A   Time 3   Period Weeks   Status Achieved           OT Long Term Goals - 08/23/16 1142      OT LONG TERM GOAL #1   Title Pt will demonstrate ability to perform functional grasp/ release of a cylindrical object with LUE  2/4 trials   Time 6   Period Weeks   Status New     OT LONG TERM GOAL #2   Title Pt will demonstrate 30* shoulder flexion in prep for funtional reach.   Time 6   Period Weeks   Status New     OT LONG TERM GOAL #3   Title Pt will demonstrate 25 % elbow ext in prep for functional reach.   Time 6   Period Weeks   Status New     OT LONG TERM GOAL #4   Title ---------------------------------------------------------------------------------------------------------               Plan - 09/15/16 1210    Clinical Impression Statement Pt cont to require cues/prompts for attention to L side. Inattention to left as well as cognition and sensibility deficits cont to be barriers. Pt has demonstrated increased ability to isolate wrist extension and elbow extension w/ cues, increased reps and facilitation. He has met STG #3 as discussed today in clinic. Cont with home program.   Rehab Potential Fair   Clinical Impairments Affecting Rehab Potential sensation deficits, L inattention, cognitive deficits   OT Frequency 2x / week   OT Duration 6 weeks   OT Treatment/Interventions Self-care/ADL training;Therapeutic exercise;DME and/or AE  instruction;Therapist, nutritional;Therapeutic activities;Patient/family education;Splinting;Neuromuscular education;Electrical Stimulation;Passive range of motion;Therapeutic exercises;Visual/perceptual remediation/compensation   Plan Neuro muscular Re-education LUE; LUE as stabilizer.   Consulted and Agree with Plan of Care Patient;Family member/caregiver   Family Member Consulted Spouse      Patient will benefit from skilled therapeutic intervention in order to improve the following deficits and impairments:  Abnormal gait, Decreased balance, Decreased endurance, Difficulty walking, Impaired sensation, Impaired vision/preception, Pain, Increased edema, Decreased range of motion, Decreased  cognition, Decreased activity tolerance, Decreased coordination, Decreased knowledge of use of DME, Decreased safety awareness, Decreased strength, Impaired UE functional use  Visit Diagnosis: Hemiplegia and hemiparesis following cerebral infarction affecting left dominant side (HCC)  Other symptoms and signs involving cognitive functions following cerebral infarction  Other lack of coordination  Visuospatial deficit  Muscle weakness (generalized)    Problem List Patient Active Problem List   Diagnosis Date Noted  . Neuropathic pain of left shoulder 05/19/2016  . Alcohol use disorder, severe, dependence ([place]) 02/15/2016  . Muscle contusion 02/11/2016  . Left flaccid hemiparesis   . Muscle spasticity   . Poor appetite   . Hyperlipidemia 01/14/2016  . Depression 01/14/2016  . Obesity 01/14/2016  . Hyperglycemia 01/14/2016  . Urinary tract infection, site not specified 01/14/2016  . Basal ganglia hemorrhage ([place]) 01/14/2016  . Hemiparesis affecting nondominant side as late effect of cerebrovascular accident ([place])   . Cognitive deficit, post-stroke   . Left hemiparesis ([place])   . Essential hypertension   . Dysphagia, post-stroke   . Gait disturbance, post-stroke   . Hyponatremia   .  Thrombocytopenia ([place])   . Hemorrhagic stroke ([place]) R basal ganglia d/t HTN 01/08/2016    Meril Dray Ardath Sax, OTR/L 09/15/2016, 12:18 PM  [facility] [address] [place], [place], [address] Phone: [phone]   Fax:  [phone]  Name: BRACEN SCHUM MRN: [id number] Date of Birth: Jun 22, 1956

## 2016-09-19 ENCOUNTER — Ambulatory Visit: Payer: 59 | Admitting: Occupational Therapy

## 2016-09-19 DIAGNOSIS — R278 Other lack of coordination: Secondary | ICD-10-CM

## 2016-09-19 DIAGNOSIS — I69318 Other symptoms and signs involving cognitive functions following cerebral infarction: Secondary | ICD-10-CM | POA: Diagnosis not present

## 2016-09-19 DIAGNOSIS — I69352 Hemiplegia and hemiparesis following cerebral infarction affecting left dominant side: Secondary | ICD-10-CM

## 2016-09-19 DIAGNOSIS — R41842 Visuospatial deficit: Secondary | ICD-10-CM | POA: Diagnosis not present

## 2016-09-19 DIAGNOSIS — R310 Gross hematuria: Secondary | ICD-10-CM | POA: Diagnosis not present

## 2016-09-19 DIAGNOSIS — M6281 Muscle weakness (generalized): Secondary | ICD-10-CM | POA: Diagnosis not present

## 2016-09-19 NOTE — Therapy (Signed)
[facility] [address] [place], [place], [address] Phone: [phone]   Fax:  [phone]  Occupational Therapy Treatment  Patient Details  Name: Jon [name] MRN: [id number] Date of Birth: 1955-11-07 Referring Provider: Dr Alysia Penna  Encounter Date: 09/19/2016      OT End of Session - 09/19/16 1406    Visit Number 9   Number of Visits 13   Date for OT Re-Evaluation 10/06/16   Authorization Type [employer], 30 visit limit per discipline, no auth req   Authorization Time Period Pt's wife reports he is changing insurance in Feb, Pt is currently scheduled for 10 visits, pt can benefit from saving visits for the future, pt prefers [name]/ Kaneesha Constantino   Authorization - Visit Number 9   Authorization - Number of Visits 30   OT Start Time 1405   OT Stop Time 1445   OT Time Calculation (min) 40 min   Activity Tolerance Patient tolerated treatment well   Behavior During Therapy [facility] for tasks assessed/performed      Past Medical History:  Diagnosis Date  . Hypertension   . Liver damage   . Stroke [facility])     Past Surgical History:  Procedure Laterality Date  . TONSILLECTOMY AND ADENOIDECTOMY Bilateral    Age 61    There were no vitals filed for this visit.      Subjective Assessment - 09/19/16 1708    Subjective  Pt reports that he is not performing updated HEP consistently.   Patient is accompained by: Family member   Pertinent History see epic   Limitations Pt reports he sometimes does not want to lie down as he is having urinary problems   Patient Stated Goals Get my left arm going again and have more control of my left foot.        Reviewed updates to HEP.    In supine, A/AAROM elbow flex/ext with focus on control with min facilitation/min-mod cues, AAROM ER as able with min-mod facilitation/cues.  AROM/AAROM wrist ext in range to avoid flexion of elbow with mod cueing.  Then partial roll to the L side  for incr wt. Bearing/L side awareness.  Pt needed cueing for attention and demo fluctuating performance due to decr attention during session.  In sitting, AAROM shoulder flex/elbow ext with UE ranger with min-mod cueing/facilitation.  In sitting, wt. Bearing through L hand with body on arm movements with mod cueing for normal movement patterns, improved posture, incr tricep activation and with min-mod facilitation.  In standing, wt. Bearing through L hand with mod cueing/facilitation.  Educated pt how to perform at home with R hand over L.    Education provided on using wife support to simulate "dancing" as they are performing at home.  Recommended slow, controlled movements, wife providing support, and avoid pulling on LUE.  Pt/wife verbalized understanding.  Discussed progress and saving OT visits to update HEP periodically during the year/after Botox.  Pt/wife verbalized desire to d/c next session with possible plan for new evaluation after Botox in April and pt continuing to work on HEP at home.                        OT Short Term Goals - 09/15/16 1213      OT SHORT TERM GOAL #1   Title I with updated HEP.   Baseline VC's   Time 3   Period Weeks   Status On-going  Needs  Mod cues     OT SHORT TERM GOAL #2   Title Pt will consistently attend to left hemibody space with no more than min v.c.   Baseline VC's and tc's   Time 3   Status On-going  Needs Mod Cuing     OT SHORT TERM GOAL #3   Title Pt will use LUE as a stabilizer 10% of the time for ADLs/ IADLs.   Baseline VC's/Min A   Time 3   Period Weeks   Status Achieved           OT Long Term Goals - 09/19/16 1722      OT LONG TERM GOAL #1   Title Pt will demonstrate ability to perform functional grasp/ release of a cylindrical object with LUE  2/4 trials   Time 6   Period Weeks   Status Not Met  09/19/16  unable     OT LONG TERM GOAL #2   Title Pt will demonstrate 30* shoulder flexion in prep  for funtional reach.   Time 6   Period Weeks   Status New     OT LONG TERM GOAL #3   Title Pt will demonstrate 25 % elbow ext in prep for functional reach.   Time 6   Period Weeks   Status New     OT LONG TERM GOAL #4   Title ---------------------------------------------------------------------------------------------------------               Plan - 09/19/16 1432    Clinical Impression Statement Pt continues to need cueing for attention to L side as well as attention in general (pt demo difficulty with internal/external distractions) which are barriers.   Rehab Potential Fair   Clinical Impairments Affecting Rehab Potential sensation deficits, L inattention, cognitive deficits   OT Frequency 2x / week   OT Duration 6 weeks   OT Treatment/Interventions Self-care/ADL training;Therapeutic exercise;DME and/or AE instruction;Therapist, nutritional;Therapeutic activities;Patient/family education;Splinting;Neuromuscular education;Electrical Stimulation;Passive range of motion;Therapeutic exercises;Visual/perceptual remediation/compensation   Plan check remaining goals and d/c as pt/wife desire to save visits for after pt receives Botox next   [place] Education provided:  08/29/16  HEP, updates 09/14/16   Consulted and Agree with Plan of Care Patient;Family member/caregiver   Family Member Consulted Spouse      Patient will benefit from skilled therapeutic intervention in order to improve the following deficits and impairments:  Abnormal gait, Decreased balance, Decreased endurance, Difficulty walking, Impaired sensation, Impaired vision/preception, Pain, Increased edema, Decreased range of motion, Decreased cognition, Decreased activity tolerance, Decreased coordination, Decreased knowledge of use of DME, Decreased safety awareness, Decreased strength, Impaired UE functional use  Visit Diagnosis: Hemiplegia and hemiparesis following cerebral infarction affecting  left dominant side (HCC)  Other symptoms and signs involving cognitive functions following cerebral infarction  Other lack of coordination  Visuospatial deficit    Problem List Patient Active Problem List   Diagnosis Date Noted  . Neuropathic pain of left shoulder 05/19/2016  . Alcohol use disorder, severe, dependence ([place]) 02/15/2016  . Muscle contusion 02/11/2016  . Left flaccid hemiparesis   . Muscle spasticity   . Poor appetite   . Hyperlipidemia 01/14/2016  . Depression 01/14/2016  . Obesity 01/14/2016  . Hyperglycemia 01/14/2016  . Urinary tract infection, site not specified 01/14/2016  . Basal ganglia hemorrhage ([place]) 01/14/2016  . Hemiparesis affecting nondominant side as late effect of cerebrovascular accident ([place])   . Cognitive deficit, post-stroke   . Left hemiparesis ([place])   .  Essential hypertension   . Dysphagia, post-stroke   . Gait disturbance, post-stroke   . Hyponatremia   . Thrombocytopenia ([place])   . Hemorrhagic stroke ([place]) R basal ganglia d/t HTN 01/08/2016    [facility] 09/19/2016, 5:29 PM  [facility] [address] [place], [place], [address] Phone: [phone]   Fax:  [phone]  Name: CORBET HANLEY MRN: [id number] Date of Birth: 11-Jul-1956   Vianne Bulls, OTR/L [facility] [address] [place], [place]  [address] [phone] phone [phone] 09/19/16 5:29 PM

## 2016-09-20 ENCOUNTER — Encounter: Payer: Self-pay | Admitting: Occupational Therapy

## 2016-09-20 ENCOUNTER — Ambulatory Visit: Payer: 59 | Admitting: Occupational Therapy

## 2016-09-20 DIAGNOSIS — M6281 Muscle weakness (generalized): Secondary | ICD-10-CM | POA: Diagnosis not present

## 2016-09-20 DIAGNOSIS — I69318 Other symptoms and signs involving cognitive functions following cerebral infarction: Secondary | ICD-10-CM

## 2016-09-20 DIAGNOSIS — R278 Other lack of coordination: Secondary | ICD-10-CM | POA: Diagnosis not present

## 2016-09-20 DIAGNOSIS — I69352 Hemiplegia and hemiparesis following cerebral infarction affecting left dominant side: Secondary | ICD-10-CM

## 2016-09-20 DIAGNOSIS — R41842 Visuospatial deficit: Secondary | ICD-10-CM

## 2016-09-20 NOTE — Patient Instructions (Signed)
Laying on your back.  Hold your left forearm.  Slowly lift wrist back.  Work on control and not letting elbow or hand get tight.  Stand at a counter/table.  Place both hands on table right hand over left and then lean on arm with elbows straight.

## 2016-09-20 NOTE — Therapy (Signed)
[facility] [address] [place], [place], [address] Phone: [phone]   Fax:  [phone]  Occupational Therapy Treatment  Patient Details  Name: Jon [name] MRN: [id number] Date of Birth: Oct 27, 1955 Referring Provider: Dr Alysia Penna  Encounter Date: 09/20/2016      OT End of Session - 09/20/16 1933    Visit Number 10   Number of Visits 13   Date for OT Re-Evaluation 10/06/16   Authorization Type BC[employer], 30 visit limit per discipline, no auth req   OT Start Time 1448   OT Stop Time 1532   OT Time Calculation (min) 44 min   Activity Tolerance Patient tolerated treatment well   Behavior During Therapy [facility] for tasks assessed/performed      Past Medical History:  Diagnosis Date  . Hypertension   . Liver damage   . Stroke [facility])     Past Surgical History:  Procedure Laterality Date  . TONSILLECTOMY AND ADENOIDECTOMY Bilateral    Age 61    There were no vitals filed for this visit.      Subjective Assessment - 09/20/16 1930    Subjective  Pt reports seeing things in am sometimes and continues to "dream" but that he has discussed with MD   Patient is accompained by: Family member   Pertinent History see epic   Limitations Pt reports he sometimes does not want to lie down as he is having urinary problems   Patient Stated Goals Get my left arm going again and have more control of my left foot.   Currently in Pain? No/denies       In supine, A/AAROM elbow flex/ext with focus on control with min facilitation/min-mod cues.  AROM/AAROM wrist ext in small range to avoid flexion of elbow and finger flex with mod cueing.  Partial roll to the L side for incr wt. Bearing/L side awareness.    In sitting, wt. Bearing through L hand with body on arm movements with mod cueing for normal movement patterns, improved posture, incr tricep activation and with min-mod facilitation, improved with repetition.  In  standing, wt. Bearing through L hand with mod cueing/facilitation.  Reviewed how to perform at home with R hand over L and issued handout.   Reviewed Recommendation for slow, controlled movements as pt continues to report exercises that he performs on his own (not his HEP).  Emphasized importance of performing HEP as instructed.  Pt needed cueing for attention/redirection and demo fluctuating performance due to decr attention during session.  Pt continue to demo decr awareness of deficits (cognitive/perceptual) and perseverates at times.  Discussed that gentle tactile stimulation, looking at L side, incorporating LUE into ADLs as able, and performing HEP as instructed consistently are important for improvement in LUE.  Checked remaining goals (see goals section) and reviewed progress and recommendation of saving OT visits to update HEP periodically during the year/after Botox (as pt has visit limits) and allow pt time to be more consistent with HEP.  Pt/wife verbalized understanding/agreement.                        OT Education - 09/20/16 1931    Education Details Added exercises to HEP--see pt instructions (wt. bearing in standing, AROM wrist ext)   Person(s) Educated Patient;Spouse   Methods Explanation;Demonstration;Verbal cues;Handout;Tactile cues   Comprehension Verbalized understanding;Returned demonstration;Verbal cues required  min-mod cueing, will need cueing from wife due to cognitive deficits  OT Short Term Goals - 09/20/16 1937      OT SHORT TERM GOAL #1   Title I with updated HEP.   Baseline VC's   Time 3   Period Weeks   Status Partially Met  Needs Mod cues due to cognitive deficits/decr attention, wife present/verbalized understanding of HEP/education provided     OT SHORT TERM GOAL #2   Title Pt will consistently attend to left hemibody space with no more than min v.c.   Baseline VC's and tc's   Time 3   Status Not Met  Needs Mod Cuing      OT SHORT TERM GOAL #3   Title Pt will use LUE as a stabilizer 10% of the time for ADLs/ IADLs.   Baseline VC's/Min A   Time 3   Period Weeks   Status Achieved           OT Long Term Goals - 09/20/16 1937      OT LONG TERM GOAL #1   Title Pt will demonstrate ability to perform functional grasp/ release of a cylindrical object with LUE  2/4 trials   Time 6   Period Weeks   Status Not Met  09/19/16  unable     OT LONG TERM GOAL #2   Title Pt will demonstrate 30* shoulder flexion in prep for funtional reach.   Time 6   Period Weeks   Status Not Met  09/20/16  pulls into flexor synergy pattern (elbow flex) with attempts, can initiate AAROM shoulder flex inconsistently     OT LONG TERM GOAL #3   Title Pt will demonstrate 25 % elbow ext in prep for functional reach.   Time 6   Period Weeks   Status Not Met  09/20/16  approx 15%     OT LONG TERM GOAL #4   Title ---------------------------------------------------------------------------------------------------------               Plan - 09/20/16 1933    Clinical Impression Statement Pt continues to need cueing for attention and to attend to L side.  Pt does demo improved ability to initiate elbow ext and wrist ext and improved ability to wt. bear though L hand.  Cognitive deficits are barriers to carryover and pt will need cueing from wife to perform HEP properly.   Rehab Potential Fair   Clinical Impairments Affecting Rehab Potential sensation deficits, L inattention, cognitive deficits   OT Frequency 2x / week   OT Duration 6 weeks   OT Treatment/Interventions Self-care/ADL training;Therapeutic exercise;DME and/or AE instruction;Therapist, nutritional;Therapeutic activities;Patient/family education;Splinting;Neuromuscular education;Electrical Stimulation;Passive range of motion;Therapeutic exercises;Visual/perceptual remediation/compensation   Plan d/c OT, pt may benefit from re-evaluation (with new order) after  he receives additional Botox and has consistently performed HEP at home as pt has OT visit limits.  Pt/wife agree with plan   OT Home Exercise Plan Education provided:  08/29/16  HEP, updates 09/14/16, 09/20/16   Consulted and Agree with Plan of Care Patient;Family member/caregiver   Family Member Consulted wife      Patient will benefit from skilled therapeutic intervention in order to improve the following deficits and impairments:  Abnormal gait, Decreased balance, Decreased endurance, Difficulty walking, Impaired sensation, Impaired vision/preception, Pain, Increased edema, Decreased range of motion, Decreased cognition, Decreased activity tolerance, Decreased coordination, Decreased knowledge of use of DME, Decreased safety awareness, Decreased strength, Impaired UE functional use  Visit Diagnosis: Hemiplegia and hemiparesis following cerebral infarction affecting left dominant side (HCC)  Other symptoms and signs involving  cognitive functions following cerebral infarction  Other lack of coordination  Visuospatial deficit  Muscle weakness (generalized)    Problem List Patient Active Problem List   Diagnosis Date Noted  . Neuropathic pain of left shoulder 05/19/2016  . Alcohol use disorder, severe, dependence ([place]) 02/15/2016  . Muscle contusion 02/11/2016  . Left flaccid hemiparesis   . Muscle spasticity   . Poor appetite   . Hyperlipidemia 01/14/2016  . Depression 01/14/2016  . Obesity 01/14/2016  . Hyperglycemia 01/14/2016  . Urinary tract infection, site not specified 01/14/2016  . Basal ganglia hemorrhage ([place]) 01/14/2016  . Hemiparesis affecting nondominant side as late effect of cerebrovascular accident ([place])   . Cognitive deficit, post-stroke   . Left hemiparesis ([place])   . Essential hypertension   . Dysphagia, post-stroke   . Gait disturbance, post-stroke   . Hyponatremia   . Thrombocytopenia ([place])   . Hemorrhagic stroke ([place]) R basal ganglia d/t HTN 01/08/2016    OCCUPATIONAL THERAPY DISCHARGE SUMMARY  Visits from Start of Care: 10 since re-eval/renewal  Current functional level related to goals / functional outcomes:  See above   Remaining deficits: L hemiplegia, L neglect/inattention, visual-perceptual deficits, cognitive deficits, sensory deficits, decr functional mobility   Education / Equipment: Pt/wife instructed in updated HEP and pt/wife verbalized understanding. Plan: Patient agrees to discharge.  Patient goals were partially met. Patient is being discharged due to                                                     Reaching maximal rehab potential at this time.  Cognitive deficits and sensory/perception deficits including L inattention/neglect are limiting factors.   Pt would benefit from performing HEP consistently for at least next 1-2 months and possible re-evaluation (with new referral) after he receives Botox again in April.  Pt/wife agrees with plan.?????        [facility] 09/20/2016, 7:57 PM  [place] [address] [place], [place], [address] Phone: [phone]   Fax:  [phone]  Name: RAWLIN REAUME MRN: [id number] Date of Birth: 09-May-1956   Vianne Bulls, OTR/L [facility] [address] [place], [place]  [address] [phone] phone [phone] 09/20/16 7:57 PM

## 2016-09-21 ENCOUNTER — Encounter: Payer: Self-pay | Admitting: *Deleted

## 2016-09-23 DIAGNOSIS — R350 Frequency of micturition: Secondary | ICD-10-CM | POA: Diagnosis not present

## 2016-09-23 DIAGNOSIS — R310 Gross hematuria: Secondary | ICD-10-CM | POA: Diagnosis not present

## 2016-09-23 DIAGNOSIS — R3915 Urgency of urination: Secondary | ICD-10-CM | POA: Diagnosis not present

## 2016-09-23 DIAGNOSIS — N401 Enlarged prostate with lower urinary tract symptoms: Secondary | ICD-10-CM | POA: Diagnosis not present

## 2016-10-06 ENCOUNTER — Telehealth: Payer: Self-pay | Admitting: Neurology

## 2016-10-06 NOTE — Telephone Encounter (Signed)
RN consulted with Dr. Roda ShuttersXu regarding referral pts wife want for eye doctor. Pt needs to contact PCP to get referral for eye doctor.

## 2016-10-06 NOTE — Telephone Encounter (Signed)
Patient's wife calling to get a referral to [facility] S[facility] DuBradley Bowen [facility] because he is having problems with his vision after a stroke. With his insurance he will need a referral.

## 2016-10-06 NOTE — Telephone Encounter (Addendum)
Left vm for patients wife that the PCP needs to be contacted for a referral to eye doctor.

## 2016-10-07 ENCOUNTER — Telehealth: Payer: Self-pay | Admitting: *Deleted

## 2016-10-07 DIAGNOSIS — I619 Nontraumatic intracerebral hemorrhage, unspecified: Secondary | ICD-10-CM

## 2016-10-07 NOTE — Telephone Encounter (Signed)
Mae referred to Aura CampsMichael Spencer, M.D., evaluate visual fields, evaluate for prism lenses, history of right MCA infarct, June 2017 [facility]  Dr Karleen HampshireSpencer [address] [place] [id number]

## 2016-10-07 NOTE — Telephone Encounter (Signed)
Referral placed, called patient's wife, left message informing that a referral has been sent to [facility]

## 2016-10-07 NOTE — Telephone Encounter (Signed)
Patient's wife left a message asking for Dr. Wynn BankerKirsteins to provide a referral to ophthamology for her husband. She did not specify as to why.  I attempted to call to gather specifics. Unable to reach patient

## 2016-10-18 DIAGNOSIS — I639 Cerebral infarction, unspecified: Secondary | ICD-10-CM | POA: Diagnosis not present

## 2016-10-18 DIAGNOSIS — H5310 Unspecified subjective visual disturbances: Secondary | ICD-10-CM | POA: Diagnosis not present

## 2016-10-18 DIAGNOSIS — H524 Presbyopia: Secondary | ICD-10-CM | POA: Diagnosis not present

## 2016-10-24 ENCOUNTER — Encounter: Payer: Self-pay | Admitting: Physical Medicine & Rehabilitation

## 2016-10-24 ENCOUNTER — Encounter: Payer: BLUE CROSS/BLUE SHIELD | Attending: Physical Medicine & Rehabilitation

## 2016-10-24 ENCOUNTER — Ambulatory Visit (HOSPITAL_BASED_OUTPATIENT_CLINIC_OR_DEPARTMENT_OTHER): Payer: BLUE CROSS/BLUE SHIELD | Admitting: Physical Medicine & Rehabilitation

## 2016-10-24 VITALS — BP 108/62 | HR 53 | Resp 14

## 2016-10-24 DIAGNOSIS — G8194 Hemiplegia, unspecified affecting left nondominant side: Secondary | ICD-10-CM | POA: Insufficient documentation

## 2016-10-24 DIAGNOSIS — G8110 Spastic hemiplegia affecting unspecified side: Secondary | ICD-10-CM | POA: Diagnosis not present

## 2016-10-24 DIAGNOSIS — Z5189 Encounter for other specified aftercare: Secondary | ICD-10-CM | POA: Diagnosis not present

## 2016-10-24 DIAGNOSIS — F1010 Alcohol abuse, uncomplicated: Secondary | ICD-10-CM | POA: Diagnosis not present

## 2016-10-24 DIAGNOSIS — I639 Cerebral infarction, unspecified: Secondary | ICD-10-CM | POA: Diagnosis not present

## 2016-10-24 DIAGNOSIS — I629 Nontraumatic intracranial hemorrhage, unspecified: Secondary | ICD-10-CM | POA: Diagnosis not present

## 2016-10-24 DIAGNOSIS — I10 Essential (primary) hypertension: Secondary | ICD-10-CM | POA: Insufficient documentation

## 2016-10-24 DIAGNOSIS — R252 Cramp and spasm: Secondary | ICD-10-CM | POA: Diagnosis not present

## 2016-10-24 NOTE — Patient Instructions (Signed)

## 2016-10-24 NOTE — Progress Notes (Signed)
Botox Injection for spasticity using needle EMG guidance  Dilution: 50 Units/ml Indication: Severe spasticity which interferes with ADL,mobility and/or  hygiene and is unresponsive to medication management and other conservative care Informed consent was obtained after describing risks and benefits of the procedure with the patient. This includes bleeding, bruising, infection, excessive weakness, or medication side effects. A REMS form is on file and signed. Needle: 27g 1" needle electrode Number of units per muscle  FCR25 FCU0 FDS25 FDP25 FPL25  All injections were done after obtaining appropriate EMG activity and after negative drawback for blood. The patient tolerated the procedure well. Post procedure instructions were given. A followup appointment was made.

## 2016-11-24 ENCOUNTER — Telehealth: Payer: Self-pay | Admitting: *Deleted

## 2016-11-24 DIAGNOSIS — G8114 Spastic hemiplegia affecting left nondominant side: Secondary | ICD-10-CM

## 2016-11-24 NOTE — Telephone Encounter (Signed)
Patient's wife called, left a message asking if we were going to place orders for occupational therapy? They were curious, that since the patient had Botox at the beginning of April, would he be doing occupational therapy as well

## 2016-11-25 NOTE — Telephone Encounter (Signed)
May order outpt OT for Left spastic hemiplegia

## 2016-11-25 NOTE — Telephone Encounter (Signed)
Referral placed, patient notified.

## 2016-12-05 ENCOUNTER — Ambulatory Visit (HOSPITAL_BASED_OUTPATIENT_CLINIC_OR_DEPARTMENT_OTHER): Payer: BLUE CROSS/BLUE SHIELD | Admitting: Physical Medicine & Rehabilitation

## 2016-12-05 ENCOUNTER — Encounter: Payer: Self-pay | Admitting: Physical Medicine & Rehabilitation

## 2016-12-05 ENCOUNTER — Ambulatory Visit: Payer: BLUE CROSS/BLUE SHIELD | Attending: Physical Medicine & Rehabilitation | Admitting: Occupational Therapy

## 2016-12-05 VITALS — BP 132/97 | HR 53 | Resp 14

## 2016-12-05 DIAGNOSIS — Z5189 Encounter for other specified aftercare: Secondary | ICD-10-CM

## 2016-12-05 DIAGNOSIS — I619 Nontraumatic intracerebral hemorrhage, unspecified: Secondary | ICD-10-CM | POA: Diagnosis not present

## 2016-12-05 DIAGNOSIS — R269 Unspecified abnormalities of gait and mobility: Secondary | ICD-10-CM

## 2016-12-05 DIAGNOSIS — G8110 Spastic hemiplegia affecting unspecified side: Secondary | ICD-10-CM | POA: Diagnosis not present

## 2016-12-05 DIAGNOSIS — I10 Essential (primary) hypertension: Secondary | ICD-10-CM | POA: Insufficient documentation

## 2016-12-05 DIAGNOSIS — I69319 Unspecified symptoms and signs involving cognitive functions following cerebral infarction: Secondary | ICD-10-CM | POA: Diagnosis not present

## 2016-12-05 DIAGNOSIS — F1010 Alcohol abuse, uncomplicated: Secondary | ICD-10-CM

## 2016-12-05 DIAGNOSIS — R208 Other disturbances of skin sensation: Secondary | ICD-10-CM

## 2016-12-05 DIAGNOSIS — R278 Other lack of coordination: Secondary | ICD-10-CM | POA: Diagnosis not present

## 2016-12-05 DIAGNOSIS — I69318 Other symptoms and signs involving cognitive functions following cerebral infarction: Secondary | ICD-10-CM | POA: Diagnosis not present

## 2016-12-05 DIAGNOSIS — I629 Nontraumatic intracranial hemorrhage, unspecified: Secondary | ICD-10-CM | POA: Insufficient documentation

## 2016-12-05 DIAGNOSIS — G8194 Hemiplegia, unspecified affecting left nondominant side: Secondary | ICD-10-CM | POA: Insufficient documentation

## 2016-12-05 DIAGNOSIS — R414 Neurologic neglect syndrome: Secondary | ICD-10-CM | POA: Diagnosis not present

## 2016-12-05 DIAGNOSIS — M6281 Muscle weakness (generalized): Secondary | ICD-10-CM | POA: Insufficient documentation

## 2016-12-05 DIAGNOSIS — R252 Cramp and spasm: Secondary | ICD-10-CM

## 2016-12-05 DIAGNOSIS — I69352 Hemiplegia and hemiparesis following cerebral infarction affecting left dominant side: Secondary | ICD-10-CM | POA: Diagnosis not present

## 2016-12-05 DIAGNOSIS — I69398 Other sequelae of cerebral infarction: Secondary | ICD-10-CM | POA: Diagnosis not present

## 2016-12-05 DIAGNOSIS — G8112 Spastic hemiplegia affecting left dominant side: Secondary | ICD-10-CM | POA: Diagnosis not present

## 2016-12-05 DIAGNOSIS — I639 Cerebral infarction, unspecified: Secondary | ICD-10-CM | POA: Diagnosis not present

## 2016-12-05 DIAGNOSIS — R2689 Other abnormalities of gait and mobility: Secondary | ICD-10-CM | POA: Insufficient documentation

## 2016-12-05 DIAGNOSIS — R41842 Visuospatial deficit: Secondary | ICD-10-CM

## 2016-12-05 MED ORDER — BACLOFEN 20 MG PO TABS: ORAL_TABLET | ORAL | 1 refills | Status: DC

## 2016-12-05 NOTE — Patient Instructions (Signed)
Repeat  

## 2016-12-05 NOTE — Therapy (Signed)
[facility] [address] [place], [place], [address] Phone: [phone]   Fax:  [phone]  Occupational Therapy Evaluation  Patient Details  Name: Jon [name] MRN: [id number] Date of Birth: 12-10-1955 Referring Provider: Dr. Claudette Laws  Encounter Date: 12/05/2016      OT End of Session - 12/05/16 1637    Visit Number 1   Number of Visits 13   Date for OT Re-Evaluation 01/19/17   Authorization Type BCBS, 30 visit limit per discipline, no auth req (has used 9 previously as of new eval, 21 remaining), [employer]BS effective 08/25/16 per wife   Authorization Time Period ---------------   Authorization - Visit Number 1   Authorization - Number of Visits 21  used 9 visits, 21 remaining   OT Start Time 1533   OT Stop Time 1620   OT Time Calculation (min) 47 min   Activity Tolerance Patient tolerated treatment well   Behavior During Therapy [facility] for tasks assessed/performed      Past Medical History:  Diagnosis Date  . Hypertension   . Liver damage   . Stroke [facility])     Past Surgical History:  Procedure Laterality Date  . TONSILLECTOMY AND ADENOIDECTOMY Bilateral    Age 61    There were no vitals filed for this visit.      Subjective Assessment - 12/05/16 1541    Subjective  Pt reports dizziness and confusion in middle of the night   Patient is accompained by: Family member  wife   Pertinent History CVA 01/08/16 with L spastic hemiplegia s/p Botox 10/24/16; HTN, hyperlipidemia, depression, urinary problems, hx of alcohol use, cognitive deficits, confusion, L neglect/inattention   Limitations Pt reports he sometimes does not want to lie down as he is having urinary problems; fall risk, L neglect/inattention, cognitive deficits/confusion    Patient Stated Goals Get my left arm going again    Currently in Pain? No/denies           [facility] OT Assessment - 12/05/16 0001      Assessment   Diagnosis CVA s/p botox  10/24/16   Referring Provider Dr. Claudette Laws   Onset Date 10/24/16  Botox   Prior Therapy OT d/c 09/20/16     Precautions   Precautions Fall   Precaution Comments left neglect   Required Braces or Orthoses --     Balance Screen   Has the patient fallen in the past 6 months No  since last d/c from OT     Home  Environment   Family/patient expects to be discharged to: Private residence   Lives With Spouse     Prior Function   Level of Independence Independent   Vocation Full time employment   Vocation Requirements Pt worked as a Armed forces logistics/support/administrative officer)   Leisure painting and piano     ADL   ADL comments Pt and wife report pt is performing ADLs with supervision/set-up     IADL   Shopping Needs to be accompanied on any shopping trip  uses w/c in store   Prior Level of Function Light Housekeeping walking outside with supervision   Meal Prep --  able to get drink, etc.   Community Mobility Relies on family or friends for transportation     Mobility   Mobility Status Comments mostly uses w/c in the home, uses hemi-walker at night, also uses quad cane at times.     Written Expression   Dominant Hand Left  use  RUE now     Vision Assessment   Patient has diffculty with activities due to visual impairment --  decr attention to L side/visual field   Comment Per pt/wife, visual field testing showed decr attention/vision to the L   decr eye contact, particularly on L side     Cognition   Overall Cognitive Status Impaired/Different from baseline   Area of Impairment Memory;Awareness;Problem solving;Safety/judgement;Following commands;Attention   Memory Decreased short-term memory   Safety/Judgement Decreased awareness of safety;Decreased awareness of deficits   Awareness Intellectual   Problem Solving Slow processing   Attention Selective   Awareness Impaired   Behaviors --  flat affect, L inattention     Sensation   Light Touch Impaired by gross assessment    Proprioception Impaired by gross assessment  decr awareness of proper positioning of LUE    Additional Comments Pt reports numbness on L side     Coordination   Coordination not using LUE functionally     Perception   Perception Impaired   Inattention/Neglect Does not attend to left visual field  does not attend to L side of body or visual field consistent     Tone   Assessment Location Left Upper Extremity     ROM / Strength   AROM / PROM / Strength AROM     AROM   Overall AROM  Deficits   Overall AROM Comments trace sh. flex and shoulder shrug, elbow flexion approx 50%, approx 25% finger flexion, unable to ext fingers, trace wrist ext, approx 50% supination, trace elbow ext      Hand Function   Comment not using LUE functionally, doing HEP, or wearing splint at this time     LUE Tone   Hypertonic Details min spasticity at L wrist/fingers and elbow                         OT Education - 12/05/16 1642    Education Details Emphasized importance of peforming HEP every day, making LUE visable, proper positioning of LUE, and attempting to use functionally to prevent future complications and promote neuro re-ed   Person(s) Educated Patient;Spouse   Methods Explanation   Comprehension Verbalized understanding          OT Short Term Goals - 12/05/16 1647      OT SHORT TERM GOAL #1   Title Pt will be independent with updated splint wear/care prn.--check STGs 01/05/17   Baseline ---   Time 4   Period Weeks   Status New     OT SHORT TERM GOAL #2   Title Pt will attend to L side of body/space with no more than min v.c.   Baseline ---   Time 4   Period Weeks   Status New     OT SHORT TERM GOAL #3   Title ----     OT SHORT TERM GOAL #5   Title -----     OT SHORT TERM GOAL #6   Title -----     OT SHORT TERM GOAL #7   Title ------           OT Long Term Goals - 12/05/16 1648      OT LONG TERM GOAL #1   Title Pt will be independent with  updated HEP.--check LTGs 01/05/17   Time 6   Period Weeks   Status New     OT LONG TERM GOAL #2   Title Pt will demonstrate 20*  shoulder flexion in prep for funtional reach.   Time 6   Period Weeks   Status New     OT LONG TERM GOAL #3   Title Pt will demonstrate 25% elbow ext in prep for functional reach.   Time 6   Period Weeks   Status --     OT LONG TERM GOAL #4   Title Pt will be able to perform simple wt. bearing through LUE mod I.   Time 6   Period Weeks   Status New     OT LONG TERM GOAL #5   Title ------------------------   Time --   Period --   Status --     Long Term Additional Goals   Additional Long Term Goals --     OT LONG TERM GOAL #6   Title -----------------------     OT LONG TERM GOAL #7   Title -----------------------               Plan - 12/05/16 1657    Clinical Impression Statement Pt is a 61 y.o. male with L spastic hemiplegia due to CVA 01/08/16.  Pt is known to this therapist with previous OT d/c 09/20/16.  Pt s/p Botox 10/24/16.  Pt returns to OT after Botox to update HEP and continue to improve LUE movement and functional use.  Pt with PMH that includes:   HTN, hyperlipidemia, depression, urinary problems, hx of alcohol use.  Pt presents with spastic L hemiplegia, decr LUE functional use/coordination, decr strength, decr functional mobility, L neglect/inattention, visual deficits, cognitive deficits.  Pt would benefit from occupational therapy to address these benefits to improve LUE functional use, attention to L side, prevention of future complications, updates to HEP, and to maximize potential s/p Botox.   Rehab Potential Fair   Clinical Impairments Affecting Rehab Potential sensation deficits, L inattention/neglect, cognitive deficits   OT Frequency 2x / week   OT Duration 6 weeks  +eval (may d/c after 4 weeks depending on progress/and to save visits for later in the year)   OT Treatment/Interventions Self-care/ADL training;Therapeutic  exercise;DME and/or AE instruction;Building services engineer;Therapeutic activities;Patient/family education;Splinting;Neuromuscular education;Electrical Stimulation;Passive range of motion;Therapeutic exercises;Visual/perceptual remediation/compensation;Cognitive remediation/compensation;Manual Therapy;Parrafin;Cryotherapy;Ultrasound;Moist Heat;Fluidtherapy   Plan review/update HEP, check current resting hand splint   OT Home Exercise Plan Education provided:  08/29/16  HEP, updates 09/14/16, 09/20/16   Consulted and Agree with Plan of Care Patient;Family member/caregiver   Family Member Consulted wife      Patient will benefit from skilled therapeutic intervention in order to improve the following deficits and impairments:  Abnormal gait, Decreased balance, Decreased endurance, Difficulty walking, Impaired sensation, Impaired vision/preception, Pain, Increased edema, Decreased range of motion, Decreased cognition, Decreased activity tolerance, Decreased coordination, Decreased knowledge of use of DME, Decreased safety awareness, Decreased strength, Impaired UE functional use, Decreased knowledge of precautions, Decreased mobility, Impaired perceived functional ability  Visit Diagnosis: Spastic hemiplegia affecting left dominant side, unspecified etiology (HCC)  Other symptoms and signs involving cognitive functions following cerebral infarction  Visuospatial deficit  Other lack of coordination  Left-sided neglect  Other abnormalities of gait and mobility  Hemiplegia and hemiparesis following cerebral infarction affecting left dominant side (HCC)  Other disturbances of skin sensation    Problem List Patient Active Problem List   Diagnosis Date Noted  . Neuropathic pain of left shoulder 05/19/2016  . Alcohol use disorder, severe, dependence (HCC) 02/15/2016  . Muscle contusion 02/11/2016  . Spastic hemiplegia affecting nondominant side (HCC)   . Muscle spasticity   .  Poor appetite    . Hyperlipidemia 01/14/2016  . Depression 01/14/2016  . Obesity 01/14/2016  . Hyperglycemia 01/14/2016  . Urinary tract infection, site not specified 01/14/2016  . Basal ganglia hemorrhage (HCC) 01/14/2016  . Cognitive deficit, post-stroke   . Left hemiparesis (HCC)   . Essential hypertension   . Dysphagia, post-stroke   . Gait disturbance, post-stroke   . Hyponatremia   . Thrombocytopenia (HCC)   . Hemorrhagic stroke (HCC) R basal ganglia d/t HTN 01/08/2016    [facility],Delpha Perko 12/05/2016, 5:13 PM  [facility] [address] [place], [place], [address] Phone: [phone]   Fax:  [phone]  Name: Jon [name] MRN: [id number] Date of Birth: 08/28/1955

## 2016-12-05 NOTE — Progress Notes (Signed)
Subjective:    Patient ID: Jon [name], male    DOB: 11-Apr-1956, 61 y.o.   MRN: [id number]  HPI  History of right basal ganglia hemorrhage in June 2017. Patient is wondering whether this is "as good as it gets.". We discussed at length timeframe of recovery of both cognitive as well as physical deficits. We also discussed what happens during a stroke and why recovery often is not complete. LEFT handed  Botox 10/24/16  FCR25 FCU0 FDS25 FDP25 FPL25  Starting to play piano  Dizziness and   Pain Inventory Average Pain 0 Pain Right Now 0 My pain is no pain  In the last 24 hours, has pain interfered with the following? General activity 0 Relation with others 0 Enjoyment of life 0 What TIME of day is your pain at its worst? no pain Sleep (in general) Good  Pain is worse with: no pain Pain improves with: no pain Relief from Meds: no pain  Mobility walk with assistance use a walker how many minutes can you walk? 10 ability to climb steps?  yes do you drive?  yes Do you have any goals in this area?  yes  Function disabled: date disabled . retired I need assistance with the following:  meal prep, household duties and shopping  Neuro/Psych weakness numbness trouble walking confusion  Prior Studies Any changes since last visit?  no  Physicians involved in your care Any changes since last visit?  no   History reviewed. No pertinent family history. Social History   Social History  . Marital status: Married    Spouse name: N/A  . Number of children: N/A  . Years of education: N/A   Social History Main Topics  . Smoking status: Former Smoker    Packs/day: 0.50    Years: 19.00    Types: Cigarettes    Quit date: 02/22/2000  . Smokeless tobacco: Never Used  . Alcohol use No     Comment: Wife reports chronic alcohol use. 01/12/16  . Drug use: No     Comment: teens to early 20's  . Sexual activity: Not Asked   Other Topics Concern  . None   Social  History Narrative  . None   Past Surgical History:  Procedure Laterality Date  . TONSILLECTOMY AND ADENOIDECTOMY Bilateral    Age 60   Past Medical History:  Diagnosis Date  . Hypertension   . Liver damage   . Stroke (HCC)    BP (!) 132/97   Pulse (!) 53   Resp 14   SpO2 97%   Opioid Risk Score:   Fall Risk Score:  `1  Depression screen PHQ 2/9  Depression screen [facility] 05/19/2016 04/21/2016  Decreased Interest 0 0  Down, Depressed, Hopeless 0 0  PHQ - 2 Score 0 0    Review of Systems  HENT: Negative.   Eyes: Negative.   Respiratory: Negative.   Cardiovascular: Negative.   Gastrointestinal: Negative.   Endocrine: Negative.   Genitourinary: Negative.   Musculoskeletal: Positive for gait problem.  Skin: Negative.   Allergic/Immunologic: Negative.   Neurological: Positive for weakness and numbness.  Hematological: Negative.   Psychiatric/Behavioral: Positive for confusion.  All other systems reviewed and are negative.      Objective:   Physical Exam  Constitutional: He appears well-developed and well-nourished.  HENT:  Head: Normocephalic and atraumatic.  Eyes: Conjunctivae and EOM are normal. Pupils are equal, round, and reactive to light.  Psychiatric: His behavior is normal.  Thought content normal. His affect is blunt. His speech is delayed.    Pectoralis Tone appears normal, but he does have limited external rotation due to capsular tightness. Motor strength is essentially 0 in the left upper extremity. He does have some trace finger flexion. His tone is MAS 3 at the wrist flexors, 1 at the finger flexors numbers 3 and 4 and 2 at the finger flexors 2 and 5, 1 at the FPL. Ambulates with a hemiwalker, no evidence of knee instability, but he does compensate for a foot drop on the left side.     Assessment & Plan:  1.  Chronic left spastic hemiplegia in a left handed patient He has plateaued in his physical functioning. We had a long discussion about  stroke recovery We'll need repeat Botox injection same doses in 6 weeks  2. Cognitive deficits related CVA, overall has improved. Still has some left neglect as well as delayed responses. He does not remember much of his rehabilitation stay. We discussed that he does have some long-term neuropsychological issues and that it he wishes, we can make a referral to neuropsychology to further evaluate. He like to hold off on this for now.  Over half of the 25 min visit was spent counseling and coordinating care.

## 2016-12-12 ENCOUNTER — Ambulatory Visit: Payer: BLUE CROSS/BLUE SHIELD | Admitting: Occupational Therapy

## 2016-12-12 DIAGNOSIS — I69318 Other symptoms and signs involving cognitive functions following cerebral infarction: Secondary | ICD-10-CM | POA: Diagnosis not present

## 2016-12-12 DIAGNOSIS — R208 Other disturbances of skin sensation: Secondary | ICD-10-CM | POA: Diagnosis not present

## 2016-12-12 DIAGNOSIS — G8112 Spastic hemiplegia affecting left dominant side: Secondary | ICD-10-CM

## 2016-12-12 DIAGNOSIS — R278 Other lack of coordination: Secondary | ICD-10-CM | POA: Diagnosis not present

## 2016-12-12 DIAGNOSIS — R2689 Other abnormalities of gait and mobility: Secondary | ICD-10-CM | POA: Diagnosis not present

## 2016-12-12 DIAGNOSIS — R41842 Visuospatial deficit: Secondary | ICD-10-CM | POA: Diagnosis not present

## 2016-12-12 DIAGNOSIS — I69352 Hemiplegia and hemiparesis following cerebral infarction affecting left dominant side: Secondary | ICD-10-CM | POA: Diagnosis not present

## 2016-12-12 DIAGNOSIS — R414 Neurologic neglect syndrome: Secondary | ICD-10-CM

## 2016-12-12 DIAGNOSIS — M6281 Muscle weakness (generalized): Secondary | ICD-10-CM | POA: Diagnosis not present

## 2016-12-12 NOTE — Therapy (Signed)
[facility] [address] [place], [place], [address] Phone: [phone]   Fax:  [phone]  Occupational Therapy Treatment  Patient Details  Name: Jon [name] MRN: [id number] Date of Birth: 01/08/1956 Referring Provider: Dr. Claudette Laws  Encounter Date: 12/12/2016      OT End of Session - 12/12/16 1647    Visit Number 2   Number of Visits 13   Date for OT Re-Evaluation 01/19/17   Authorization Type [employer], 30 visit limit per discipline, no auth req (has used 9 previously as of new eval, 21 remaining), [employer] effective 08/25/16 per wife   Authorization Time Period ---------------   Authorization - Visit Number 2   Authorization - Number of Visits 21  used 9 visits, 21 remaining   OT Start Time 1448   OT Stop Time 1530   OT Time Calculation (min) 42 min   Activity Tolerance Patient tolerated treatment well   Behavior During Therapy [facility] for tasks assessed/performed      Past Medical History:  Diagnosis Date  . Hypertension   . Liver damage   . Stroke [facility])     Past Surgical History:  Procedure Laterality Date  . TONSILLECTOMY AND ADENOIDECTOMY Bilateral    Age 61    There were no vitals filed for this visit.      Subjective Assessment - 12/12/16 1640    Subjective  Pt reports continued dizziness   Patient is accompained by: Family member  wife   Pertinent History CVA 01/08/16 with L spastic hemiplegia s/p Botox 10/24/16; HTN, hyperlipidemia, depression, urinary problems, hx of alcohol use, cognitive deficits, confusion, L neglect/inattention   Limitations Pt reports he sometimes does not want to lie down as he is having urinary problems; fall risk, L neglect/inattention, cognitive deficits/confusion    Patient Stated Goals Get my left arm going again    Currently in Pain? No/denies      Began reviewing previous HEP including (wt. Bearing through LUE in sitting with body on arm movements/lateral wt.  Shift, Wt. Bearing through LUE (with RUE on top) on table in standing, AAROM shoulder flex/elbow ext with hemi-walker).  Pt returned demo with mod cueing for attention to LUE during ex. And for proper performance.    Modified previous resting hand splint to incr wrist ext after Botox.                         OT Education - 12/12/16 1642    Education Details Reviewed splint wear/care instructions and purpose of splint; Reviewed importance of daily HEP performance and looking to L side   Person(s) Educated Patient;Spouse   Methods Explanation;Demonstration   Comprehension Verbalized understanding          OT Short Term Goals - 12/05/16 1647      OT SHORT TERM GOAL #1   Title Pt will be independent with updated splint wear/care prn.--check STGs 01/05/17   Baseline ---   Time 4   Period Weeks   Status New     OT SHORT TERM GOAL #2   Title Pt will attend to L side of body/space with no more than min v.c.   Baseline ---   Time 4   Period Weeks   Status New     OT SHORT TERM GOAL #3   Title ----     OT SHORT TERM GOAL #5   Title -----     OT SHORT TERM GOAL #  6   Title -----     OT SHORT TERM GOAL #7   Title ------           OT Long Term Goals - 12/05/16 1648      OT LONG TERM GOAL #1   Title Pt will be independent with updated HEP.--check LTGs 01/05/17   Time 6   Period Weeks   Status New     OT LONG TERM GOAL #2   Title Pt will demonstrate 20* shoulder flexion in prep for funtional reach.   Time 6   Period Weeks   Status New     OT LONG TERM GOAL #3   Title Pt will demonstrate 25% elbow ext in prep for functional reach.   Time 6   Period Weeks   Status --     OT LONG TERM GOAL #4   Title Pt will be able to perform simple wt. bearing through LUE mod I.   Time 6   Period Weeks   Status New     OT LONG TERM GOAL #5   Title ------------------------   Time --   Period --   Status --     Long Term Additional Goals   Additional Long  Term Goals --     OT LONG TERM GOAL #6   Title -----------------------     OT LONG TERM GOAL #7   Title -----------------------               Plan - 12/12/16 1647    Clinical Impression Statement Pt demo improved ability to wt. bear through LUE in sitting today.  However, pt continues to need mod-max cueing at times to attend to L side during exercises.  Was able to modify splint for incr wrist ext.   Rehab Potential Fair   Clinical Impairments Affecting Rehab Potential sensation deficits, L inattention/neglect, cognitive deficits   OT Frequency 2x / week   OT Duration 6 weeks  +eval (may d/c after 4 weeks depending on progress/and to save visits for later in the year)   OT Treatment/Interventions Self-care/ADL training;Therapeutic exercise;DME and/or AE instruction;Building services engineer;Therapeutic activities;Patient/family education;Splinting;Neuromuscular education;Electrical Stimulation;Passive range of motion;Therapeutic exercises;Visual/perceptual remediation/compensation;Cognitive remediation/compensation;Manual Therapy;Parrafin;Cryotherapy;Ultrasound;Moist Heat;Fluidtherapy   Plan continue reviewing HEP (supine exercises)   OT Home Exercise Plan Education provided:  08/29/16  HEP, updates 09/14/16, 09/20/16   Consulted and Agree with Plan of Care Patient;Family member/caregiver   Family Member Consulted wife      Patient will benefit from skilled therapeutic intervention in order to improve the following deficits and impairments:  Abnormal gait, Decreased balance, Decreased endurance, Difficulty walking, Impaired sensation, Impaired vision/preception, Pain, Increased edema, Decreased range of motion, Decreased cognition, Decreased activity tolerance, Decreased coordination, Decreased knowledge of use of DME, Decreased safety awareness, Decreased strength, Impaired UE functional use, Decreased knowledge of precautions, Decreased mobility, Impaired perceived functional  ability  Visit Diagnosis: Spastic hemiplegia affecting left dominant side, unspecified etiology (HCC)  Other symptoms and signs involving cognitive functions following cerebral infarction  Visuospatial deficit  Other lack of coordination  Left-sided neglect  Other abnormalities of gait and mobility  Other disturbances of skin sensation    Problem List Patient Active Problem List   Diagnosis Date Noted  . Neuropathic pain of left shoulder 05/19/2016  . Alcohol use disorder, severe, dependence (HCC) 02/15/2016  . Muscle contusion 02/11/2016  . Spastic hemiplegia affecting nondominant side (HCC)   . Muscle spasticity   . Poor appetite   . Hyperlipidemia 01/14/2016  .  Depression 01/14/2016  . Obesity 01/14/2016  . Hyperglycemia 01/14/2016  . Urinary tract infection, site not specified 01/14/2016  . Basal ganglia hemorrhage (HCC) 01/14/2016  . Cognitive deficit, post-stroke   . Left hemiparesis (HCC)   . Essential hypertension   . Dysphagia, post-stroke   . Gait disturbance, post-stroke   . Hyponatremia   . Thrombocytopenia (HCC)   . Hemorrhagic stroke (HCC) R basal ganglia d/t HTN 01/08/2016    [facility] CenterFREEMAN,[name] 12/12/2016, 4:49 PM  [facility] [address] [place], [place], [address] Phone: [phone]   Fax:  [phone]  Name: Jon [name] MRN: [id number] Date of Birth: 1956-01-04   Willa FraterAngela Freeman, OTR/L [facility] [address] [place], [place]  [address] [phone] phone [phone] 12/12/16 4:50 PM

## 2016-12-13 ENCOUNTER — Ambulatory Visit: Payer: BLUE CROSS/BLUE SHIELD | Admitting: Occupational Therapy

## 2016-12-13 DIAGNOSIS — R208 Other disturbances of skin sensation: Secondary | ICD-10-CM | POA: Diagnosis not present

## 2016-12-13 DIAGNOSIS — R414 Neurologic neglect syndrome: Secondary | ICD-10-CM

## 2016-12-13 DIAGNOSIS — R278 Other lack of coordination: Secondary | ICD-10-CM | POA: Diagnosis not present

## 2016-12-13 DIAGNOSIS — G8112 Spastic hemiplegia affecting left dominant side: Secondary | ICD-10-CM | POA: Diagnosis not present

## 2016-12-13 DIAGNOSIS — I69318 Other symptoms and signs involving cognitive functions following cerebral infarction: Secondary | ICD-10-CM | POA: Diagnosis not present

## 2016-12-13 DIAGNOSIS — R41842 Visuospatial deficit: Secondary | ICD-10-CM | POA: Diagnosis not present

## 2016-12-13 DIAGNOSIS — R2689 Other abnormalities of gait and mobility: Secondary | ICD-10-CM

## 2016-12-13 DIAGNOSIS — M6281 Muscle weakness (generalized): Secondary | ICD-10-CM | POA: Diagnosis not present

## 2016-12-13 DIAGNOSIS — I69352 Hemiplegia and hemiparesis following cerebral infarction affecting left dominant side: Secondary | ICD-10-CM | POA: Diagnosis not present

## 2016-12-13 NOTE — Therapy (Signed)
[facility] [address] [place], [place], [address] Phone: [phone]   Fax:  [phone]  Occupational Therapy Treatment  Patient Details  Name: Jon [name] MRN: [id number] Date of Birth: December 23, 1955 Referring Provider: Dr. Claudette Laws  Encounter Date: 12/13/2016      OT End of Session - 12/13/16 1451    Visit Number 3   Number of Visits 13   Date for OT Re-Evaluation 01/19/17   Authorization Type [employer], 30 visit limit per discipline, no auth req (has used 9 previously as of new eval, 21 remaining), [employer] effective 08/25/16 per wife   Authorization Time Period ---------------   Authorization - Visit Number 3   Authorization - Number of Visits 21  used 9 visits, 21 remaining   OT Start Time 1452   OT Stop Time 1534   OT Time Calculation (min) 42 min   Activity Tolerance Patient tolerated treatment well   Behavior During Therapy [facility] for tasks assessed/performed      Past Medical History:  Diagnosis Date  . Hypertension   . Liver damage   . Stroke [facility])     Past Surgical History:  Procedure Laterality Date  . TONSILLECTOMY AND ADENOIDECTOMY Bilateral    Age 61    There were no vitals filed for this visit.      Subjective Assessment - 12/13/16 1451    Subjective  Pt reports that he "feels the wheels spinning in his head" when doing exercises   Patient is accompained by: Family member  wife   Pertinent History CVA 01/08/16 with L spastic hemiplegia s/p Botox 10/24/16; HTN, hyperlipidemia, depression, urinary problems, hx of alcohol use, cognitive deficits, confusion, L neglect/inattention   Limitations Pt reports he sometimes does not want to lie down as he is having urinary problems; fall risk, L neglect/inattention, cognitive deficits/confusion    Patient Stated Goals Get my left arm going again    Currently in Pain? No/denies       Reviewed proper way to position L hand in walker splint for wt.  Bearing and proper alignment and educated pt in importance of alignment inside RW to help with wrist positioning.    Sit>stand and ambulation with RW with focus on increasing wt. On L side, midline alignment, and normal movement patterns with min facilitation, mod cues.   In supine, Reviewed previous HEP (as pt wasn't performing)--all continue to be appropriate including self PROM shoulder flex, wt. Bearing on L side with partial roll, AAROM wrist ext, AROM/AAROM controlled elbow flex/ext.  Pt needed mod cueing to attend to LUE consistently during exercises and min-mod cueing for proper alignment/technique.  Pt instructed in how spasticity can limit movement and how actively relaxing muscle prior to attempts at ext and using slow controlled movements can address this.   Wt. Bearing through LUE/hand in sitting with body on arm movements/lateral wt. Shift with mod-max difficulty, facilitation, cueing today.  Wt. Bearing through L elbow with push to sit with mod facilitation/cueing for incr LUE activation.  Pt reports no problems with updated splint.                     OT Short Term Goals - 12/13/16 1556      OT SHORT TERM GOAL #1   Title Pt will be independent with updated splint wear/care prn.--check STGs 01/05/17   Baseline ---   Time 4   Period Weeks   Status New     OT  SHORT TERM GOAL #2   Title Pt will attend to L side of body/space with no more than min v.c.   Baseline ---   Time 4   Period Weeks   Status New           OT Long Term Goals - 12/13/16 1557      OT LONG TERM GOAL #1   Title Pt will be independent with updated HEP.--check LTGs 01/05/17   Time 6   Period Weeks   Status New     OT LONG TERM GOAL #2   Title Pt will demonstrate 20* shoulder flexion in prep for funtional reach.   Time 6   Period Weeks   Status New     OT LONG TERM GOAL #3   Title Pt will demonstrate 25% elbow ext in prep for functional reach.   Time 6   Period Weeks      OT LONG TERM GOAL #4   Title Pt will be able to perform simple wt. bearing through LUE mod I.   Time 6   Period Weeks   Status New               Plan - 12/13/16 1554    Clinical Impression Statement Pt inconsistent with ability to activate LUE for wt. bearing today which appears to be due to sensory and attention deficits.  Pt will need reinforcement for any education provided.   Rehab Potential Fair   Clinical Impairments Affecting Rehab Potential sensation deficits, L inattention/neglect, cognitive deficits   OT Frequency 2x / week   OT Duration 6 weeks  +eval (may d/c after 4 weeks depending on progress/and to save visits for later in the year)   OT Treatment/Interventions Self-care/ADL training;Therapeutic exercise;DME and/or AE instruction;Building services engineerunctional Mobility Training;Therapeutic activities;Patient/family education;Splinting;Neuromuscular education;Electrical Stimulation;Passive range of motion;Therapeutic exercises;Visual/perceptual remediation/compensation;Cognitive remediation/compensation;Manual Therapy;Parrafin;Cryotherapy;Ultrasound;Moist Heat;Fluidtherapy   Plan continue with neuro re-ed, wt. bearing as able   OT Home Exercise Plan Education provided:  Reviewed previous HEP (continues to be appropriate 12/13/16)   Consulted and Agree with Plan of Care Patient;Family member/caregiver   Family Member Consulted wife      Patient will benefit from skilled therapeutic intervention in order to improve the following deficits and impairments:  Abnormal gait, Decreased balance, Decreased endurance, Difficulty walking, Impaired sensation, Impaired vision/preception, Pain, Increased edema, Decreased range of motion, Decreased cognition, Decreased activity tolerance, Decreased coordination, Decreased knowledge of use of DME, Decreased safety awareness, Decreased strength, Impaired UE functional use, Decreased knowledge of precautions, Decreased mobility, Impaired perceived functional  ability  Visit Diagnosis: Spastic hemiplegia affecting left dominant side, unspecified etiology (HCC)  Other symptoms and signs involving cognitive functions following cerebral infarction  Visuospatial deficit  Other lack of coordination  Left-sided neglect  Other abnormalities of gait and mobility  Other disturbances of skin sensation  Hemiplegia and hemiparesis following cerebral infarction affecting left dominant side (HCC)  Muscle weakness (generalized)    Problem List Patient Active Problem List   Diagnosis Date Noted  . Neuropathic pain of left shoulder 05/19/2016  . Alcohol use disorder, severe, dependence (HCC) 02/15/2016  . Muscle contusion 02/11/2016  . Spastic hemiplegia affecting nondominant side (HCC)   . Muscle spasticity   . Poor appetite   . Hyperlipidemia 01/14/2016  . Depression 01/14/2016  . Obesity 01/14/2016  . Hyperglycemia 01/14/2016  . Urinary tract infection, site not specified 01/14/2016  . Basal ganglia hemorrhage (HCC) 01/14/2016  . Cognitive deficit, post-stroke   . Left hemiparesis (HCC)   .  Essential hypertension   . Dysphagia, post-stroke   . Gait disturbance, post-stroke   . Hyponatremia   . Thrombocytopenia (HCC)   . Hemorrhagic stroke (HCC) R basal ganglia d/t HTN 01/08/2016    [facility] 12/13/2016, 4:07 PM  [facility] [address] [place], [place], [address] Phone: [phone]   Fax:  [phone]  Name: CHALMER ZHENG MRN: [id number] Date of Birth: [birth year]   Willa Frater, OTR/L [facility] [address] [place], [place]  [address] [phone] phone [phone] 12/13/16 4:07 PM

## 2016-12-20 ENCOUNTER — Ambulatory Visit: Payer: BLUE CROSS/BLUE SHIELD | Admitting: Occupational Therapy

## 2016-12-20 DIAGNOSIS — I69352 Hemiplegia and hemiparesis following cerebral infarction affecting left dominant side: Secondary | ICD-10-CM | POA: Diagnosis not present

## 2016-12-20 DIAGNOSIS — R2689 Other abnormalities of gait and mobility: Secondary | ICD-10-CM

## 2016-12-20 DIAGNOSIS — R278 Other lack of coordination: Secondary | ICD-10-CM | POA: Diagnosis not present

## 2016-12-20 DIAGNOSIS — R208 Other disturbances of skin sensation: Secondary | ICD-10-CM

## 2016-12-20 DIAGNOSIS — I69318 Other symptoms and signs involving cognitive functions following cerebral infarction: Secondary | ICD-10-CM | POA: Diagnosis not present

## 2016-12-20 DIAGNOSIS — M6281 Muscle weakness (generalized): Secondary | ICD-10-CM | POA: Diagnosis not present

## 2016-12-20 DIAGNOSIS — R414 Neurologic neglect syndrome: Secondary | ICD-10-CM

## 2016-12-20 DIAGNOSIS — G8112 Spastic hemiplegia affecting left dominant side: Secondary | ICD-10-CM | POA: Diagnosis not present

## 2016-12-20 DIAGNOSIS — R41842 Visuospatial deficit: Secondary | ICD-10-CM

## 2016-12-20 NOTE — Patient Instructions (Addendum)
Place empty shoe box, throw pillow, ball on floor (hold on sides with both hands).  Reach down to grasp and bring to your lap, keep arms level.  Focus on keeping weight on left side and making sure you don't hike shoulder or let elbow "wing out".  Keep elbow tucked close to your body.  Then place back on the floor.    Place empty bottle in left hand, try to hold with gently removing lid with right hand.  Go slow and focus on holding with left hand.  Sit up tall, try to bring hand into lap without hiking shoulder.  Then bring back to sofa by relaxing elbow.

## 2016-12-20 NOTE — Therapy (Signed)
[facility] [address] [place], [place], [address] Phone: [phone]   Fax:  [phone]  Occupational Therapy Treatment  Patient Details  Name: Jon [name] MRN: [id number] Date of Birth: [birth year] Referring Provider: Dr. Claudette Laws  Encounter Date: 12/20/2016      OT End of Session - 12/20/16 1235    Visit Number 4   Number of Visits 13   Date for OT Re-Evaluation 01/19/17   Authorization Type [employer], 30 visit limit per discipline, no auth req (has used 9 previously as of new eval, 21 remaining), [employer] effective 08/25/16 per wife   Authorization Time Period ---------------   Authorization - Visit Number 4   Authorization - Number of Visits 21  used 9 visits, 21 remaining   OT Start Time 0934   OT Stop Time 1015   OT Time Calculation (min) 41 min   Activity Tolerance Patient tolerated treatment well   Behavior During Therapy [facility] for tasks assessed/performed      Past Medical History:  Diagnosis Date  . Hypertension   . Liver damage   . Stroke [facility])     Past Surgical History:  Procedure Laterality Date  . TONSILLECTOMY AND ADENOIDECTOMY Bilateral    Age 61    There were no vitals filed for this visit.      Subjective Assessment - 12/20/16 1000    Subjective  "I feel it sore, I haven't felt that in a while"   Patient is accompained by: Family member  wife   Pertinent History CVA 01/08/16 with L spastic hemiplegia s/p Botox 10/24/16; HTN, hyperlipidemia, depression, urinary problems, hx of alcohol use, cognitive deficits, confusion, L neglect/inattention   Limitations Pt reports he sometimes does not want to lie down as he is having urinary problems; fall risk, L neglect/inattention, cognitive deficits/confusion    Patient Stated Goals Get my left arm going again    Currently in Pain? No/denies  soreness only with exercise "muscle working"        [employer]. Bearing through LUE/hand in sitting with  body on arm movements/lateral wt. Shift with min-mod difficulty, min facilitation/cueing today--improved.  Using L hand as a stabilizer when opening bottle with RUE with min-mod cueing and min facilitation.  Bilateral functional low range reach to grasp ball and use LUE to stabilize object with min-mod facilitation and cueing.  Functional movement to lift LUE into lap and then onto mat beside pt using L elbow flex/ext control and active relaxation with min facilitation initially, min-mod cueing and to incr L side awareness.                      OT Education - 12/20/16 1307    Education Details Updates/additions to HEP--see pt instructions   Person(s) Educated Patient   Methods Explanation;Demonstration;Verbal cues;Handout;Tactile cues   Comprehension Verbalized understanding;Returned demonstration;Verbal cues required          OT Short Term Goals - 12/20/16 1301      OT SHORT TERM GOAL #1   Title Pt will be independent with updated splint wear/care prn.--check STGs 01/05/17   Baseline ---   Time 4   Period Weeks   Status Achieved  12/20/16     OT SHORT TERM GOAL #2   Title Pt will attend to L side of body/space with no more than min v.c.   Baseline ---   Time 4   Period Weeks   Status New  OT Long Term Goals - 12/13/16 1557      OT LONG TERM GOAL #1   Title Pt will be independent with updated HEP.--check LTGs 01/05/17   Time 6   Period Weeks   Status New     OT LONG TERM GOAL #2   Title Pt will demonstrate 20* shoulder flexion in prep for funtional reach.   Time 6   Period Weeks   Status New     OT LONG TERM GOAL #3   Title Pt will demonstrate 25% elbow ext in prep for functional reach.   Time 6   Period Weeks     OT LONG TERM GOAL #4   Title Pt will be able to perform simple wt. bearing through LUE mod I.   Time 6   Period Weeks   Status New               Plan - 12/20/16 1259    Clinical Impression Statement Pt demo  improvement with wt.bearing through LUE today, but demo difficulty with activation unless it is in functional context.  Pt also able to initiate stabilizing object with BUEs with cueing and facilitation.  Pt is progressing towards goals.   Rehab Potential Fair   Current Impairments/barriers affecting progress: sensation deficits, L inattention/neglect, cognitive deficits   OT Frequency 2x / week   OT Duration 6 weeks  +eval (may d/c after 4 weeks depending on progress/and to save visits for later in the year)   OT Treatment/Interventions Self-care/ADL training;Therapeutic exercise;DME and/or AE instruction;Building services engineer;Therapeutic activities;Patient/family education;Splinting;Neuromuscular education;Electrical Stimulation;Passive range of motion;Therapeutic exercises;Visual/perceptual remediation/compensation;Cognitive remediation/compensation;Manual Therapy;Parrafin;Cryotherapy;Ultrasound;Moist Heat;Fluidtherapy   OT Home Exercise Plan Education provided:  Reviewed previous HEP (continues to be appropriate 12/13/16); updates to HEP 12/20/16   Consulted and Agree with Plan of Care Patient;Family member/caregiver   Family Member Consulted wife   Plan continue with neuro re-ed, wt. bearing as able, LUE functional use.      Patient will benefit from skilled therapeutic intervention in order to improve the following deficits and impairments:  Abnormal gait, Decreased balance, Decreased endurance, Difficulty walking, Impaired sensation, Impaired vision/preception, Pain, Increased edema, Decreased range of motion, Decreased cognition, Decreased activity tolerance, Decreased coordination, Decreased knowledge of use of DME, Decreased safety awareness, Decreased strength, Impaired UE functional use, Decreased knowledge of precautions, Decreased mobility, Impaired perceived functional ability  Visit Diagnosis: Spastic hemiplegia affecting left dominant side, unspecified etiology (HCC)  Other  symptoms and signs involving cognitive functions following cerebral infarction  Visuospatial deficit  Other lack of coordination  Left-sided neglect  Other abnormalities of gait and mobility  Other disturbances of skin sensation    Problem List Patient Active Problem List   Diagnosis Date Noted  . Neuropathic pain of left shoulder 05/19/2016  . Alcohol use disorder, severe, dependence (HCC) 02/15/2016  . Muscle contusion 02/11/2016  . Spastic hemiplegia affecting nondominant side (HCC)   . Muscle spasticity   . Poor appetite   . Hyperlipidemia 01/14/2016  . Depression 01/14/2016  . Obesity 01/14/2016  . Hyperglycemia 01/14/2016  . Urinary tract infection, site not specified 01/14/2016  . Basal ganglia hemorrhage (HCC) 01/14/2016  . Cognitive deficit, post-stroke   . Left hemiparesis (HCC)   . Essential hypertension   . Dysphagia, post-stroke   . Gait disturbance, post-stroke   . Hyponatremia   . Thrombocytopenia (HCC)   . Hemorrhagic stroke (HCC) R basal ganglia d/t HTN 01/08/2016    [facility] 12/20/2016, 1:09 PM  [facility] [address] [place], [place], [address] Phone: [phone]   Fax:  [phone]  Name: Jon [name] MRN: [id number] Date of Birth: Dec 22, 1955   Willa FraterAngela Freeman, OTR/L [facility] [address] [place], [place]  [address] [phone] phone [phone] 12/20/16 1:09 PM

## 2016-12-22 ENCOUNTER — Ambulatory Visit: Payer: BLUE CROSS/BLUE SHIELD | Admitting: Occupational Therapy

## 2016-12-22 DIAGNOSIS — M6281 Muscle weakness (generalized): Secondary | ICD-10-CM | POA: Diagnosis not present

## 2016-12-22 DIAGNOSIS — R414 Neurologic neglect syndrome: Secondary | ICD-10-CM

## 2016-12-22 DIAGNOSIS — R2689 Other abnormalities of gait and mobility: Secondary | ICD-10-CM | POA: Diagnosis not present

## 2016-12-22 DIAGNOSIS — R278 Other lack of coordination: Secondary | ICD-10-CM | POA: Diagnosis not present

## 2016-12-22 DIAGNOSIS — R208 Other disturbances of skin sensation: Secondary | ICD-10-CM

## 2016-12-22 DIAGNOSIS — R41842 Visuospatial deficit: Secondary | ICD-10-CM | POA: Diagnosis not present

## 2016-12-22 DIAGNOSIS — I69352 Hemiplegia and hemiparesis following cerebral infarction affecting left dominant side: Secondary | ICD-10-CM | POA: Diagnosis not present

## 2016-12-22 DIAGNOSIS — G8112 Spastic hemiplegia affecting left dominant side: Secondary | ICD-10-CM

## 2016-12-22 DIAGNOSIS — I69318 Other symptoms and signs involving cognitive functions following cerebral infarction: Secondary | ICD-10-CM

## 2016-12-22 NOTE — Therapy (Addendum)
[facility] [address] [place], [place], [address] Phone: [phone]   Fax:  [phone]  Occupational Therapy Treatment  Patient Details  Name: Jon [name] MRN: [id number] Date of Birth: 05/06/1956 Referring Provider: Dr. Claudette Laws  Encounter Date: 12/22/2016      OT End of Session - 12/22/16 1145    Visit Number 5   Number of Visits 13   Date for OT Re-Evaluation 01/19/17   Authorization Type [employer], 30 visit limit per discipline, no auth req (has used 9 previously as of new eval, 21 remaining), [employer] effective 08/25/16 per wife   Authorization Time Period ---------------   Authorization - Visit Number 5   Authorization - Number of Visits 21  used 9 visits, 21 remaining   OT Start Time 0937   OT Stop Time 1015   OT Time Calculation (min) 38 min   Activity Tolerance Patient tolerated treatment well   Behavior During Therapy [facility] for tasks assessed/performed      Past Medical History:  Diagnosis Date  . Hypertension   . Liver damage   . Stroke [facility])     Past Surgical History:  Procedure Laterality Date  . TONSILLECTOMY AND ADENOIDECTOMY Bilateral    Age 61    There were no vitals filed for this visit.      Subjective Assessment - 12/22/16 1145    Subjective  "This is hard"   Patient is accompained by: Family member  wife   Pertinent History CVA 01/08/16 with L spastic hemiplegia s/p Botox 10/24/16; HTN, hyperlipidemia, depression, urinary problems, hx of alcohol use, cognitive deficits, confusion, L neglect/inattention   Limitations Pt reports he sometimes does not want to lie down as he is having urinary problems; fall risk, L neglect/inattention, cognitive deficits/confusion    Patient Stated Goals Get my left arm going again    Currently in Pain? No/denies       Wt. Bearing through LUE/hand in sitting with body on arm movements/lateral wt. Shift with min-mod difficulty, min  facilitation/cueing today.  Bilateral functional low range reach to grasp lightweight object and use LUE to stabilize object with min-mod facilitation and cueing.  Fluctuated with attention to task and attention to LUE.  Functional movement to lift LUE into lap and then onto mat beside pt using L elbow flex/ext control and active relaxation with min-mod facilitation, min-mod cueing and to incr L side awareness.  Attention to task was a barrier.  AAROM shoulder flex/elbow ext with mod difficulty/cues to decr spasticity and for controlled movement.   Ambulating with RW with min cueing for LLE.  Recommended pt wear LLE brace due to ER of foot decr wt. Shift to the L.  Also added velcro/adjusted strap on RW hand piece for L hand.  Using BUEs to grasp cup with handle and bring towards mouth with mod cueing/facilitation for normal movement patterns.                   OT Short Term Goals - 12/20/16 1301      OT SHORT TERM GOAL #1   Title Pt will be independent with updated splint wear/care prn.--check STGs 01/05/17   Baseline ---   Time 4   Period Weeks   Status Achieved  12/20/16     OT SHORT TERM GOAL #2   Title Pt will attend to L side of body/space with no more than min v.c.   Baseline ---   Time 4  Period Weeks   Status New           OT Long Term Goals - 12/13/16 1557      OT LONG TERM GOAL #1   Title Pt will be independent with updated HEP.--check LTGs 01/05/17   Time 6   Period Weeks   Status New     OT LONG TERM GOAL #2   Title Pt will demonstrate 20* shoulder flexion in prep for funtional reach.   Time 6   Period Weeks   Status New     OT LONG TERM GOAL #3   Title Pt will demonstrate 25% elbow ext in prep for functional reach.   Time 6   Period Weeks     OT LONG TERM GOAL #4   Title Pt will be able to perform simple wt. bearing through LUE mod I.   Time 6   Period Weeks   Status New               Plan - 12/22/16 1146    Clinical  Impression Statement Pt demo improved attention to L side today and is progressing slowly towards goals.  Pt continues to have difficulty with activation of LUE initially and sustaining attention to task.   Rehab Potential Fair   Current Impairments/barriers affecting progress: sensation deficits, L inattention/neglect, cognitive deficits   OT Frequency 2x / week   OT Duration 6 weeks  +eval (may d/c after 4 weeks depending on progress/and to save visits for later in the year)   OT Treatment/Interventions Self-care/ADL training;Therapeutic exercise;DME and/or AE instruction;Building services engineer;Therapeutic activities;Patient/family education;Splinting;Neuromuscular education;Electrical Stimulation;Passive range of motion;Therapeutic exercises;Visual/perceptual remediation/compensation;Cognitive remediation/compensation;Manual Therapy;Parrafin;Cryotherapy;Ultrasound;Moist Heat;Fluidtherapy   Plan continue with neuro re-ed, wt. bearing as able, LUE functional use   OT Home Exercise Plan Education provided:  Reviewed previous HEP (continues to be appropriate 12/13/16); updates to HEP 12/20/16   Consulted and Agree with Plan of Care Patient;Family member/caregiver   Family Member Consulted wife      Patient will benefit from skilled therapeutic intervention in order to improve the following deficits and impairments:  Abnormal gait, Decreased balance, Decreased endurance, Difficulty walking, Impaired sensation, Impaired vision/preception, Pain, Increased edema, Decreased range of motion, Decreased cognition, Decreased activity tolerance, Decreased coordination, Decreased knowledge of use of DME, Decreased safety awareness, Decreased strength, Impaired UE functional use, Decreased knowledge of precautions, Decreased mobility, Impaired perceived functional ability  Visit Diagnosis: Spastic hemiplegia affecting left dominant side, unspecified etiology (HCC)  Other symptoms and signs involving  cognitive functions following cerebral infarction  Visuospatial deficit  Other lack of coordination  Left-sided neglect  Other abnormalities of gait and mobility  Other disturbances of skin sensation    Problem List Patient Active Problem List   Diagnosis Date Noted  . Neuropathic pain of left shoulder 05/19/2016  . Alcohol use disorder, severe, dependence (HCC) 02/15/2016  . Muscle contusion 02/11/2016  . Spastic hemiplegia affecting nondominant side (HCC)   . Muscle spasticity   . Poor appetite   . Hyperlipidemia 01/14/2016  . Depression 01/14/2016  . Obesity 01/14/2016  . Hyperglycemia 01/14/2016  . Urinary tract infection, site not specified 01/14/2016  . Basal ganglia hemorrhage (HCC) 01/14/2016  . Cognitive deficit, post-stroke   . Left hemiparesis (HCC)   . Essential hypertension   . Dysphagia, post-stroke   . Gait disturbance, post-stroke   . Hyponatremia   . Thrombocytopenia (HCC)   . Hemorrhagic stroke (HCC) R basal ganglia d/t HTN 01/08/2016    [facility] 12/22/2016,  11:47 AM  [facility] [address] [place], [place], [address] Phone: [phone]   Fax:  [phone]  Name: Tawanna CoolerGeorge D Waid MRN: [id number] Date of Birth: [birth year]   Willa FraterAngela Kasidi Shanker, OTR/L [facility] [address] [place], [place]  [address] [phone] phone [phone] 12/22/16 11:47 AM

## 2016-12-27 ENCOUNTER — Ambulatory Visit: Payer: BLUE CROSS/BLUE SHIELD | Attending: Physical Medicine & Rehabilitation | Admitting: Occupational Therapy

## 2016-12-27 DIAGNOSIS — I69318 Other symptoms and signs involving cognitive functions following cerebral infarction: Secondary | ICD-10-CM | POA: Diagnosis not present

## 2016-12-27 DIAGNOSIS — R278 Other lack of coordination: Secondary | ICD-10-CM | POA: Diagnosis not present

## 2016-12-27 DIAGNOSIS — M6281 Muscle weakness (generalized): Secondary | ICD-10-CM | POA: Diagnosis not present

## 2016-12-27 DIAGNOSIS — G8194 Hemiplegia, unspecified affecting left nondominant side: Secondary | ICD-10-CM | POA: Insufficient documentation

## 2016-12-27 DIAGNOSIS — R41842 Visuospatial deficit: Secondary | ICD-10-CM | POA: Insufficient documentation

## 2016-12-27 DIAGNOSIS — R414 Neurologic neglect syndrome: Secondary | ICD-10-CM | POA: Diagnosis not present

## 2016-12-27 DIAGNOSIS — R208 Other disturbances of skin sensation: Secondary | ICD-10-CM | POA: Insufficient documentation

## 2016-12-27 DIAGNOSIS — G8112 Spastic hemiplegia affecting left dominant side: Secondary | ICD-10-CM | POA: Insufficient documentation

## 2016-12-27 DIAGNOSIS — R2689 Other abnormalities of gait and mobility: Secondary | ICD-10-CM | POA: Insufficient documentation

## 2016-12-27 DIAGNOSIS — I69319 Unspecified symptoms and signs involving cognitive functions following cerebral infarction: Secondary | ICD-10-CM | POA: Diagnosis not present

## 2016-12-27 NOTE — Therapy (Signed)
[facility] [address] [place], [place], [address] Phone: [phone]   Fax:  [phone]  Occupational Therapy Treatment  Patient Details  Name: Jon [name] MRN: [id number] Date of Birth: [birth year] Referring Provider: Dr. Claudette LawsAndrew Kirsteins  Encounter Date: 12/27/2016      OT End of Session - 12/27/16 1659    Visit Number 6   Number of Visits 13   Date for OT Re-Evaluation 01/19/17   Authorization Type [employer], 30 visit limit per discipline, no auth req (has used 9 previously as of new eval, 21 remaining), [employer] effective 08/25/16 per wife   Authorization Time Period ---------------   Authorization - Visit Number 6   Authorization - Number of Visits 21  used 9 visits, 21 remaining   OT Start Time 1535   OT Stop Time 1617   OT Time Calculation (min) 42 min   Activity Tolerance Patient tolerated treatment well   Behavior During Therapy [facility] for tasks assessed/performed      Past Medical History:  Diagnosis Date  . Hypertension   . Liver damage   . Stroke [facility](HCC)     Past Surgical History:  Procedure Laterality Date  . TONSILLECTOMY AND ADENOIDECTOMY Bilateral    Age 61    There were no vitals filed for this visit.      Subjective Assessment - 12/27/16 1657    Subjective  I am doing my exercises   Patient is accompained by: Family member  wife   Pertinent History CVA 01/08/16 with L spastic hemiplegia s/p Botox 10/24/16; HTN, hyperlipidemia, depression, urinary problems, hx of alcohol use, cognitive deficits, confusion, L neglect/inattention   Limitations Pt reports he sometimes does not want to lie down as he is having urinary problems; fall risk, L neglect/inattention, cognitive deficits/confusion    Patient Stated Goals Get my left arm going again    Currently in Pain? No/denies          Ambulating with RW with min cueing/facilitation for LLE wt. Shift and pelvic alignment.  Recommended pt wear LLE  brace due to ER of foot incr wt. Shift to the L.  Sit>stand with focus on midline alignment and light wt. Bearing thought LUE through therapist hand/forearm with mod cues/facilitation.  In supine, AAROM/AROM wrist ext, supination/shoulder ER, and elbow flex/ext with focus on controlled movement and isolating movement without incr tone.  Pt performed with mod cueing/min facilitation.                     OT Short Term Goals - 12/20/16 1301      OT SHORT TERM GOAL #1   Title Pt will be independent with updated splint wear/care prn.--check STGs 01/05/17   Baseline ---   Time 4   Period Weeks   Status Achieved  12/20/16     OT SHORT TERM GOAL #2   Title Pt will attend to L side of body/space with no more than min v.c.   Baseline ---   Time 4   Period Weeks   Status New           OT Long Term Goals - 12/13/16 1557      OT LONG TERM GOAL #1   Title Pt will be independent with updated HEP.--check LTGs 01/05/17   Time 6   Period Weeks   Status New     OT LONG TERM GOAL #2   Title Pt will demonstrate 20* shoulder flexion in prep for  funtional reach.   Time 6   Period Weeks   Status New     OT LONG TERM GOAL #3   Title Pt will demonstrate 25% elbow ext in prep for functional reach.   Time 6   Period Weeks     OT LONG TERM GOAL #4   Title Pt will be able to perform simple wt. bearing through LUE mod I.   Time 6   Period Weeks   Status New               Plan - 12/27/16 1704    Clinical Impression Statement Pt demo improving ability to perform initiate isolated wrist ext, elbow flex/ext, and supination/ER.  However, pt continues to need mod cueing for midline alignment and for attention to L side (visually and during functional movements).   Rehab Potential Fair   Current Impairments/barriers affecting progress: sensation deficits, L inattention/neglect, cognitive deficits   OT Frequency 2x / week   OT Duration 6 weeks  +eval (may d/c after 4 weeks  depending on progress/and to save visits for later in the year)   OT Treatment/Interventions Self-care/ADL training;Therapeutic exercise;DME and/or AE instruction;Building services engineer;Therapeutic activities;Patient/family education;Splinting;Neuromuscular education;Electrical Stimulation;Passive range of motion;Therapeutic exercises;Visual/perceptual remediation/compensation;Cognitive remediation/compensation;Manual Therapy;Parrafin;Cryotherapy;Ultrasound;Moist Heat;Fluidtherapy   Plan continue with neuro re-ed, wt. bearing as able, LUE functional use.   OT Home Exercise Plan Education provided:  Reviewed previous HEP (continues to be appropriate 12/13/16); updates to HEP 12/20/16   Consulted and Agree with Plan of Care Patient;Family member/caregiver   Family Member Consulted wife      Patient will benefit from skilled therapeutic intervention in order to improve the following deficits and impairments:  Abnormal gait, Decreased balance, Decreased endurance, Difficulty walking, Impaired sensation, Impaired vision/preception, Pain, Increased edema, Decreased range of motion, Decreased cognition, Decreased activity tolerance, Decreased coordination, Decreased knowledge of use of DME, Decreased safety awareness, Decreased strength, Impaired UE functional use, Decreased knowledge of precautions, Decreased mobility, Impaired perceived functional ability  Visit Diagnosis: Spastic hemiplegia affecting left dominant side, unspecified etiology (HCC)  Other symptoms and signs involving cognitive functions following cerebral infarction  Visuospatial deficit  Other lack of coordination  Left-sided neglect  Other abnormalities of gait and mobility  Other disturbances of skin sensation    Problem List Patient Active Problem List   Diagnosis Date Noted  . Neuropathic pain of left shoulder 05/19/2016  . Alcohol use disorder, severe, dependence (HCC) 02/15/2016  . Muscle contusion 02/11/2016   . Spastic hemiplegia affecting nondominant side (HCC)   . Muscle spasticity   . Poor appetite   . Hyperlipidemia 01/14/2016  . Depression 01/14/2016  . Obesity 01/14/2016  . Hyperglycemia 01/14/2016  . Urinary tract infection, site not specified 01/14/2016  . Basal ganglia hemorrhage (HCC) 01/14/2016  . Cognitive deficit, post-stroke   . Left hemiparesis (HCC)   . Essential hypertension   . Dysphagia, post-stroke   . Gait disturbance, post-stroke   . Hyponatremia   . Thrombocytopenia (HCC)   . Hemorrhagic stroke (HCC) R basal ganglia d/t HTN 01/08/2016    [facility] 12/27/2016, 5:07 PM  [facility] [address] [place], [place], [address] Phone: [phone]   Fax:  [phone]  Name: Jon [name] MRN: [id number] Date of Birth: Jun 22, 1956   Willa Frater, OTR/L [facility] [address] [place], [place]  [address] [phone] phone [phone] 12/27/16 5:15 PM

## 2016-12-29 ENCOUNTER — Telehealth: Payer: Self-pay | Admitting: Occupational Therapy

## 2016-12-29 ENCOUNTER — Ambulatory Visit: Payer: BLUE CROSS/BLUE SHIELD | Admitting: Occupational Therapy

## 2016-12-29 DIAGNOSIS — R41842 Visuospatial deficit: Secondary | ICD-10-CM | POA: Diagnosis not present

## 2016-12-29 DIAGNOSIS — R208 Other disturbances of skin sensation: Secondary | ICD-10-CM

## 2016-12-29 DIAGNOSIS — G8194 Hemiplegia, unspecified affecting left nondominant side: Secondary | ICD-10-CM | POA: Diagnosis not present

## 2016-12-29 DIAGNOSIS — R414 Neurologic neglect syndrome: Secondary | ICD-10-CM

## 2016-12-29 DIAGNOSIS — R2689 Other abnormalities of gait and mobility: Secondary | ICD-10-CM

## 2016-12-29 DIAGNOSIS — R278 Other lack of coordination: Secondary | ICD-10-CM | POA: Diagnosis not present

## 2016-12-29 DIAGNOSIS — I69319 Unspecified symptoms and signs involving cognitive functions following cerebral infarction: Secondary | ICD-10-CM | POA: Diagnosis not present

## 2016-12-29 DIAGNOSIS — M6281 Muscle weakness (generalized): Secondary | ICD-10-CM | POA: Diagnosis not present

## 2016-12-29 DIAGNOSIS — I69318 Other symptoms and signs involving cognitive functions following cerebral infarction: Secondary | ICD-10-CM | POA: Diagnosis not present

## 2016-12-29 DIAGNOSIS — G8112 Spastic hemiplegia affecting left dominant side: Secondary | ICD-10-CM | POA: Diagnosis not present

## 2016-12-29 NOTE — Therapy (Signed)
[facility] [address] [place], [place], [address] Phone: [phone]   Fax:  [phone]  Occupational Therapy Treatment  Patient Details  Name: Jon [name] MRN: [id number] Date of Birth: [birth year] Referring Provider: Dr. Claudette LawsAndrew Kirsteins  Encounter Date: 12/29/2016      OT End of Session - 12/29/16 1643    Visit Number 7   Number of Visits 13   Date for OT Re-Evaluation 01/19/17   Authorization Type [employer], 30 visit limit per discipline, no auth req (has used 9 previously as of new eval, 21 remaining), [employer] effective 08/25/16 per wife   Authorization Time Period ---------------   Authorization - Visit Number 7   Authorization - Number of Visits 21  used 9 visits, 21 remaining   OT Start Time 1533   OT Stop Time 1615   OT Time Calculation (min) 42 min   Activity Tolerance Patient tolerated treatment well   Behavior [facility] for tasks assessed/performed      Past Medical History:  Diagnosis Date  . Hypertension   . Liver damage   . Stroke [facility](HCC)     Past Surgical History:  Procedure Laterality Date  . TONSILLECTOMY AND ADENOIDECTOMY Bilateral    Age 61    There were no vitals filed for this visit.      Subjective Assessment - 12/29/16 1540    Subjective  Pt reports that he is only wearing his splint a little   Patient is accompained by: Family member  wife   Pertinent History CVA 01/08/16 with L spastic hemiplegia s/p Botox 10/24/16; HTN, hyperlipidemia, depression, urinary problems, hx of alcohol use, cognitive deficits, confusion, L neglect/inattention   Limitations Pt reports he sometimes does not want to lie down as he is having urinary problems; fall risk, L neglect/inattention, cognitive deficits/confusion    Patient Stated Goals Get my left arm going again    Currently in Pain? No/denies         Ambulating with RW with min cueing/facilitation for LLE wt. Shift and pelvic alignment.   Sit>stand and stand>sit with focus on midline alignment and  with mod cues/facilitation.  Wt. Bearing through LUE/hand in sitting with body on arm movements/lateral wt. Shift with min difficulty, min facilitation/cueing today.  Then wt. Bearing through L hand in standing with body on arm movements RUE reaching with min facilitation/cueing.  Functional movement to lift LUE into lap and then onto mat beside pt using L elbow flex/ext control and active relaxation with min-mod facilitation, min-mod cueing and to incr L side awareness.    In standing, AAROM L shoulder flex/elbow ext and horizontal abduction/ER on ball with mod facilitation/cues.                     OT Education - 12/29/16 1725    Education Details Recommendation for PT tune up and benefits; Recommendation to hold after next week until approx 2 wks s/p Botox; importance of performing HEP, wearing splint, and following recommendations (incr attention to L side) for progress   Person(s) Educated Patient;Spouse   Methods Explanation   Comprehension Verbalized understanding  Pt wishes to persue PT referral and agrees with plan for OT          OT Short Term Goals - 12/29/16 1717      OT SHORT TERM GOAL #1   Title Pt will be independent with updated splint wear/care prn.--check STGs 01/05/17   Baseline ---   Time  4   Period Weeks   Status Achieved  12/20/16     OT SHORT TERM GOAL #2   Title Pt will attend to L side of body/space with no more than min v.c.   Baseline ---   Time 4   Period Weeks   Status On-going  12/29/16:  min-mod cues           OT Long Term Goals - 12/29/16 1719      OT LONG TERM GOAL #1   Title Pt will be independent with updated HEP.--check LTGs 01/05/17   Time 6   Period Weeks   Status New     OT LONG TERM GOAL #2   Title Pt will demonstrate 20* shoulder flexion in prep for funtional reach.   Time 6   Period Weeks   Status New     OT LONG TERM GOAL #3   Title Pt will  demonstrate 25% elbow ext in prep for functional reach.   Time 6   Period Weeks     OT LONG TERM GOAL #4   Title Pt will be able to perform simple wt. bearing through LUE mod I.   Time 6   Period Weeks   Status New               Plan - 12/29/16 1653    Clinical Impression Statement Pt demo slow progress with improving L side attention and attention to task.  Pt demo incr activation of LUE functional use with cueing and facilitation.  Emphasized importance of HEP, splint wear, and recommendations.   Rehab Potential Fair   Current Impairments/barriers affecting progress: sensation deficits, L inattention/neglect, cognitive deficits   OT Frequency 2x / week   OT Duration 6 weeks  +eval (may d/c after 4 weeks depending on progress/and to save visits for later in the year)   OT Treatment/Interventions Self-care/ADL training;Therapeutic exercise;DME and/or AE instruction;Building services engineer;Therapeutic activities;Patient/family education;Splinting;Neuromuscular education;Electrical Stimulation;Passive range of motion;Therapeutic exercises;Visual/perceptual remediation/compensation;Cognitive remediation/compensation;Manual Therapy;Parrafin;Cryotherapy;Ultrasound;Moist Heat;Fluidtherapy   Plan begin checking goals and anticipate holding OT after next week (with renewal at approx 2 wks s/p Botox)   OT Home Exercise Plan Education provided:  Reviewed previous HEP (continues to be appropriate 12/13/16); updates to HEP 12/20/16   Consulted and Agree with Plan of Care Patient;Family member/caregiver   Family Member Consulted wife      Patient will benefit from skilled therapeutic intervention in order to improve the following deficits and impairments:  Abnormal gait, Decreased balance, Decreased endurance, Difficulty walking, Impaired sensation, Impaired vision/preception, Pain, Increased edema, Decreased range of motion, Decreased cognition, Decreased activity tolerance, Decreased  coordination, Decreased knowledge of use of DME, Decreased safety awareness, Decreased strength, Impaired UE functional use, Decreased knowledge of precautions, Decreased mobility, Impaired perceived functional ability  Visit Diagnosis: Spastic hemiplegia affecting left dominant side, unspecified etiology (HCC)  Other symptoms and signs involving cognitive functions following cerebral infarction  Visuospatial deficit  Other lack of coordination  Left-sided neglect  Other abnormalities of gait and mobility  Other disturbances of skin sensation    Problem List Patient Active Problem List   Diagnosis Date Noted  . Neuropathic pain of left shoulder 05/19/2016  . Alcohol use disorder, severe, dependence (HCC) 02/15/2016  . Muscle contusion 02/11/2016  . Spastic hemiplegia affecting nondominant side (HCC)   . Muscle spasticity   . Poor appetite   . Hyperlipidemia 01/14/2016  . Depression 01/14/2016  . Obesity 01/14/2016  . Hyperglycemia 01/14/2016  . Urinary tract infection,  site not specified 01/14/2016  . Basal ganglia hemorrhage (HCC) 01/14/2016  . Cognitive deficit, post-stroke   . Left hemiparesis (HCC)   . Essential hypertension   . Dysphagia, post-stroke   . Gait disturbance, post-stroke   . Hyponatremia   . Thrombocytopenia (HCC)   . Hemorrhagic stroke (HCC) R basal ganglia d/t HTN 01/08/2016    [facility] 12/29/2016, 5:28 PM  [facility] [address] [place], [place], [address] Phone: [phone]   Fax:  [phone]  Name: Jon [name] MRN: [id number] Date of Birth: 01-Aug-1955   Willa Frater, OTR/L [facility] [address] [place], [place]  [address] [phone] phone [phone] 12/29/16 5:28 PM

## 2016-12-29 NOTE — Telephone Encounter (Signed)
Dr. Wynn BankerKirsteins,   [name] may benefit from referral to physical therapy to address to update recommendations and HEP for balance, mobility, and LLE since his attention has improved since he last saw PT.  (pt is in agreement).  If you agree, please send a PT order via epic.  Thank you,  Willa FraterAngela Jordanny Waddington, OTR/L [facility] [address] [place], [place]  [address] [phone] phone [phone] 12/29/16 5:36 PM

## 2016-12-30 NOTE — Telephone Encounter (Signed)
Please refer to PT for balance,has gait disorder , Left hemiparesis

## 2016-12-30 NOTE — Telephone Encounter (Signed)
ordered

## 2017-01-02 ENCOUNTER — Ambulatory Visit: Payer: BLUE CROSS/BLUE SHIELD | Admitting: Occupational Therapy

## 2017-01-02 DIAGNOSIS — R2689 Other abnormalities of gait and mobility: Secondary | ICD-10-CM

## 2017-01-02 DIAGNOSIS — G8112 Spastic hemiplegia affecting left dominant side: Secondary | ICD-10-CM | POA: Diagnosis not present

## 2017-01-02 DIAGNOSIS — R41842 Visuospatial deficit: Secondary | ICD-10-CM

## 2017-01-02 DIAGNOSIS — I69318 Other symptoms and signs involving cognitive functions following cerebral infarction: Secondary | ICD-10-CM

## 2017-01-02 DIAGNOSIS — R414 Neurologic neglect syndrome: Secondary | ICD-10-CM

## 2017-01-02 DIAGNOSIS — R208 Other disturbances of skin sensation: Secondary | ICD-10-CM

## 2017-01-02 DIAGNOSIS — G8194 Hemiplegia, unspecified affecting left nondominant side: Secondary | ICD-10-CM | POA: Diagnosis not present

## 2017-01-02 DIAGNOSIS — M6281 Muscle weakness (generalized): Secondary | ICD-10-CM | POA: Diagnosis not present

## 2017-01-02 DIAGNOSIS — R278 Other lack of coordination: Secondary | ICD-10-CM | POA: Diagnosis not present

## 2017-01-02 DIAGNOSIS — I69319 Unspecified symptoms and signs involving cognitive functions following cerebral infarction: Secondary | ICD-10-CM | POA: Diagnosis not present

## 2017-01-02 NOTE — Therapy (Signed)
[place] [address] [place], [place], [address] Phone: [phone]   Fax:  [phone]  Occupational Therapy Treatment  Patient Details  Name: Jon [name] MRN: [id number] Date of Birth: [birth year] Referring Provider: Dr. Alysia Penna  Encounter Date: 01/02/2017      OT End of Session - 01/02/17 1727    Visit Number 8   Number of Visits 13   Date for OT Re-Evaluation 01/19/17   Authorization Type [employer], 30 visit limit per discipline, no auth req (has used 9 previously as of new eval, 21 remaining), [employer] effective 08/25/16 per wife   Authorization Time Period ---------------   Authorization - Visit Number 8   Authorization - Number of Visits 21  used 9 visits, 21 remaining   OT Start Time 1454   OT Stop Time 1534   OT Time Calculation (min) 40 min   Activity Tolerance Patient tolerated treatment well   Behavior During Therapy [facility] for tasks assessed/performed      Past Medical History:  Diagnosis Date  . Hypertension   . Liver damage   . Stroke [facility])     Past Surgical History:  Procedure Laterality Date  . TONSILLECTOMY AND ADENOIDECTOMY Bilateral    Age 61    There were no vitals filed for this visit.      Subjective Assessment - 01/02/17 1727    Subjective  Pt reports that he is painting rocks   Patient is accompained by: Family member  wife   Pertinent History CVA 01/08/16 with L spastic hemiplegia s/p Botox 10/24/16; HTN, hyperlipidemia, depression, urinary problems, hx of alcohol use, cognitive deficits, confusion, L neglect/inattention   Limitations Pt reports he sometimes does not want to lie down as he is having urinary problems; fall risk, L neglect/inattention, cognitive deficits/confusion    Patient Stated Goals Get my left arm going again    Currently in Pain? No/denies        Ambulating with RW with min cueing/facilitation for LLE wt. Shift and pelvic alignment.  Sit>stand and  stand>sit with focus on midline alignment and  with mod cues/facilitation.--improved today  Wt. Bearing through [place] in sitting with body on arm movements/lateral wt. Shift with min difficulty, min facilitation/cueing today.    Functional movement to lift LUE into lap and then onto mat beside pt using L elbow flex/ext control and active relaxation with min-mod facilitation, min-mod cueing and to incr L side awareness.    In supine, AAROM L elbow ext with shoulder and wrist supported with min-mod facilitation.  Then chest press BUEs with ball with mod facilitation/cues.  Bilateral low range functional reach to grasp ball and bring to lap and then hold ball with BUEs to incr LUE functional use as a stabilizer with mod cues/facilitation.                             OT Short Term Goals - 12/29/16 1717      OT SHORT TERM GOAL #1   Title Pt will be independent with updated splint wear/care prn.--check STGs 01/05/17   Baseline ---   Time 4   Period Weeks   Status Achieved  12/20/16     OT SHORT TERM GOAL #2   Title Pt will attend to L side of body/space with no more than min v.c.   Baseline ---   Time 4   Period Weeks   Status On-going  12/29/16:  min-mod cues           OT Long Term Goals - 01/02/17 1734      OT LONG TERM GOAL #1   Title Pt will be independent with updated HEP.--check LTGs 01/05/17   Time 6   Period Weeks   Status New     OT LONG TERM GOAL #2   Title Pt will demonstrate 20* shoulder flexion in prep for funtional reach.   Time 6   Period Weeks   Status New     OT LONG TERM GOAL #3   Title Pt will demonstrate 25% elbow ext in prep for functional reach.   Time 6   Period Weeks   Status Achieved  01/02/17  met      OT LONG TERM GOAL #4   Title Pt will be able to perform simple wt. bearing through LUE mod I.   Time 6   Period Weeks   Status New               Plan - 01/02/17 1733    Clinical Impression Statement Pt demo  improved ability to activate LUE on demand as a stabilizer this week.   Rehab Potential Fair   Current Impairments/barriers affecting progress: sensation deficits, L inattention/neglect, cognitive deficits   OT Frequency 2x / week   OT Duration 6 weeks  +eval (may d/c after 4 weeks depending on progress/and to save visits for later in the year)   OT Treatment/Interventions Self-care/ADL training;Therapeutic exercise;DME and/or AE instruction;Therapist, nutritional;Therapeutic activities;Patient/family education;Splinting;Neuromuscular education;Electrical Stimulation;Passive range of motion;Therapeutic exercises;Visual/perceptual remediation/compensation;Cognitive remediation/compensation;Manual Therapy;Parrafin;Cryotherapy;Ultrasound;Moist Heat;Fluidtherapy   Plan check remaining goals and discuss d/c vs. plan to hold due to visit limits and upcoming Botox   OT Home Exercise Plan Education provided:  Reviewed previous HEP (continues to be appropriate 12/13/16); updates to HEP 12/20/16   Consulted and Agree with Plan of Care Patient;Family member/caregiver   Family Member Consulted wife      Patient will benefit from skilled therapeutic intervention in order to improve the following deficits and impairments:  Abnormal gait, Decreased balance, Decreased endurance, Difficulty walking, Impaired sensation, Impaired vision/preception, Pain, Increased edema, Decreased range of motion, Decreased cognition, Decreased activity tolerance, Decreased coordination, Decreased knowledge of use of DME, Decreased safety awareness, Decreased strength, Impaired UE functional use, Decreased knowledge of precautions, Decreased mobility, Impaired perceived functional ability  Visit Diagnosis: Spastic hemiplegia affecting left dominant side, unspecified etiology (HCC)  Visuospatial deficit  Other lack of coordination  Left-sided neglect  Other symptoms and signs involving cognitive functions following cerebral  infarction  Other abnormalities of gait and mobility  Other disturbances of skin sensation    Problem List Patient Active Problem List   Diagnosis Date Noted  . Neuropathic pain of left shoulder 05/19/2016  . Alcohol use disorder, severe, dependence ([place]) 02/15/2016  . Muscle contusion 02/11/2016  . Spastic hemiplegia affecting nondominant side ([place])   . Muscle spasticity   . Poor appetite   . Hyperlipidemia 01/14/2016  . Depression 01/14/2016  . Obesity 01/14/2016  . Hyperglycemia 01/14/2016  . Urinary tract infection, site not specified 01/14/2016  . Basal ganglia hemorrhage ([place]) 01/14/2016  . Cognitive deficit, post-stroke   . Left hemiparesis ([place])   . Essential hypertension   . Dysphagia, post-stroke   . Gait disturbance, post-stroke   . Hyponatremia   . Thrombocytopenia ([place])   . Hemorrhagic stroke ([place]) R basal ganglia d/t HTN 01/08/2016    [facility] 01/02/2017, 5:37 PM  [place]  [facility] [address] [place], [place], [address] Phone: [phone]   Fax:  [phone]  Name: MAKO PELFREY MRN: [id number] Date of Birth: April 12, 1956   Vianne Bulls, OTR/L [facility] [address] [place], [place]  [address] [phone] phone [phone] 01/02/17 5:37 PM

## 2017-01-03 ENCOUNTER — Ambulatory Visit: Payer: BLUE CROSS/BLUE SHIELD | Admitting: Occupational Therapy

## 2017-01-13 ENCOUNTER — Ambulatory Visit: Payer: BLUE CROSS/BLUE SHIELD | Admitting: Physical Therapy

## 2017-01-16 ENCOUNTER — Encounter: Payer: Self-pay | Admitting: Neurology

## 2017-01-16 ENCOUNTER — Ambulatory Visit (INDEPENDENT_AMBULATORY_CARE_PROVIDER_SITE_OTHER): Payer: BLUE CROSS/BLUE SHIELD | Admitting: Neurology

## 2017-01-16 ENCOUNTER — Telehealth: Payer: Self-pay | Admitting: Occupational Therapy

## 2017-01-16 ENCOUNTER — Ambulatory Visit: Payer: BLUE CROSS/BLUE SHIELD | Admitting: Occupational Therapy

## 2017-01-16 VITALS — BP 95/58 | HR 55 | Wt 145.0 lb

## 2017-01-16 DIAGNOSIS — R41842 Visuospatial deficit: Secondary | ICD-10-CM | POA: Diagnosis not present

## 2017-01-16 DIAGNOSIS — G8112 Spastic hemiplegia affecting left dominant side: Secondary | ICD-10-CM | POA: Diagnosis not present

## 2017-01-16 DIAGNOSIS — I610 Nontraumatic intracerebral hemorrhage in hemisphere, subcortical: Secondary | ICD-10-CM | POA: Diagnosis not present

## 2017-01-16 DIAGNOSIS — R414 Neurologic neglect syndrome: Secondary | ICD-10-CM | POA: Diagnosis not present

## 2017-01-16 DIAGNOSIS — I69318 Other symptoms and signs involving cognitive functions following cerebral infarction: Secondary | ICD-10-CM | POA: Diagnosis not present

## 2017-01-16 DIAGNOSIS — F3289 Other specified depressive episodes: Secondary | ICD-10-CM

## 2017-01-16 DIAGNOSIS — M6281 Muscle weakness (generalized): Secondary | ICD-10-CM | POA: Diagnosis not present

## 2017-01-16 DIAGNOSIS — I69319 Unspecified symptoms and signs involving cognitive functions following cerebral infarction: Secondary | ICD-10-CM | POA: Diagnosis not present

## 2017-01-16 DIAGNOSIS — I69359 Hemiplegia and hemiparesis following cerebral infarction affecting unspecified side: Secondary | ICD-10-CM

## 2017-01-16 DIAGNOSIS — I619 Nontraumatic intracerebral hemorrhage, unspecified: Secondary | ICD-10-CM

## 2017-01-16 DIAGNOSIS — R2689 Other abnormalities of gait and mobility: Secondary | ICD-10-CM

## 2017-01-16 DIAGNOSIS — R278 Other lack of coordination: Secondary | ICD-10-CM

## 2017-01-16 DIAGNOSIS — R208 Other disturbances of skin sensation: Secondary | ICD-10-CM | POA: Diagnosis not present

## 2017-01-16 DIAGNOSIS — G8194 Hemiplegia, unspecified affecting left nondominant side: Secondary | ICD-10-CM | POA: Diagnosis not present

## 2017-01-16 NOTE — Progress Notes (Signed)
STROKE NEUROLOGY FOLLOW UP NOTE  NAME: Jon [name] DOB: Jul 12, 1956  REASON FOR VISIT: stroke follow up HISTORY FROM: pt and chart  Today we had the pleasure of seeing Jon [name] in follow-up at our Neurology Clinic. Pt was accompanied by wife.   History Summary Jon [name] is a 61 y.o. male with history of HTN, HLD, and alcohol abuse admitted no 01/08/16 for severe headache, left-sided weakness, slurred speech, and elevated blood pressure. CT showed a R basal ganglia hemorrhage likely due to HTN. CTA head and neck showed no AVM or aneurysm. EF 60-65%. LDL 157 and A1C 5.5. Resumed home metoprolol but added amlodipine for BP control. Also continued home pravastatin for HLD. Added zoloft for depression. He was discharged to CIR after stabilization but still has left hemiplegia.   04/11/16 follow up - the patient has been doing well. Released from CIR and now at home undergoing outpt PT/OT/speech. Still has left sided weakness, but left leg able to raise up but left arm still not able to move. BP at home 115/70, today in clinic 95/62. Reported occular migraine with rainbow colored imaging at right eye lasting . No associated HA. It happened 2 times for the last 3 months.   07/07/06 follow up - pt has been doing well. He has lost 50lbs since the stroke, and no he has no more snore or apnea during sleep as per wife. However, he stated that when he sleeps at night, about half wake and half sleep, he sometimes see spider at the ceiling when fully awake, there is no spider. He still has left sided hemiparesis, follows with Dr. Wynn Banker considering botox injection. BP 101/60 today, at home his BP also low, will decrease metoprolol dose.   Interval History During the interval time, pt has been following with Dr. Wynn Banker and got botox injection at left side. Still has distal weakness including hand and foot. Continued to have outpt PT/OT 1-2 times per week, only has 13 sessions left for  this year. Pt complains of dizziness, especially with sitting up or standing up. His BP today still low at 95/58. Pt stated that his BP at home 110/70. His BP was low last two visits, and metoprolol decreased from 100mg  bid to 50mg  bid. He was advised to discuss with PCP.   REVIEW OF SYSTEMS: Full 14 system review of systems performed and notable only for those listed below and in HPI above, all others are negative:  Constitutional:   Cardiovascular:  Ear/Nose/Throat:  Ringing in ears Skin:  Eyes:   Respiratory:   Gastroitestinal:   Genitourinary:  Hematology/Lymphatic:   Endocrine:  Musculoskeletal:  Walking difficulty Allergy/Immunology:   Neurological:  dizziness Psychiatric: confusion, depression Sleep:   The following represents the patient's updated allergies and side effects list: Allergies  Allergen Reactions  . Gluten Meal Nausea And Vomiting  . Lactose Intolerance (Gi) Nausea And Vomiting  . Other     Tree nuts   . Shellfish Allergy Swelling and Rash    The neurologically relevant items on the patient's problem list were reviewed on today's visit.  Neurologic Examination  A problem focused neurological exam (12 or more points of the single system neurologic examination, vital signs counts as 1 point, cranial nerves count for 8 points) was performed.  Blood pressure (!) 95/58, pulse (!) 55, weight 145 lb (65.8 kg).  General - Well nourished, well developed, in no apparent distress.  Ophthalmologic - Fundi not visualized due to eye  movement.  Cardiovascular - Regular rate and rhythm with no murmur.  Mental Status -  Level of arousal and orientation to time, place, and person were intact. Language including expression, naming, repetition, comprehension was assessed and found intact, mild dysarthria. Fund of Knowledge was assessed and was intact.  Cranial Nerves II - XII - II - Visual field intact OU. III, IV, VI - Extraocular movements intact. V - Facial  sensation intact bilaterally. VII - left facial droop VIII - Hearing & vestibular intact bilaterally. X - Palate elevates symmetrically. XI - Chin turning & shoulder shrug intact bilaterally. XII - Tongue protrusion intact.  Motor Strength - The patient's strength was LUE 3/5 proximal and 0/5 distal, LLE 4/5 proximal and 0/5 distal, 5/5 RUE and RLE.  Bulk was normal and fasciculations were absent.   Motor Tone - Muscle tone was assessed at the neck and appendages and was mildly increased at LLE and LUE.  Reflexes - The patient's reflexes were 1+ in all extremities except 3+ LLE and he had left babainski with unsustained ankle clonus on the left.  Sensory - Light touch, temperature/pinprick were assessed and were symmetric.    Coordination - The patient had normal movements in the right hand with no ataxia or dysmetria.  Tremor was absent.  Gait and Station - walk with walker, spastic hemiparetic gait on the left.   Data reviewed: I personally reviewed the images and agree with the radiology interpretations.  Ct Head Wo Contrast 01/08/2016  Right basal ganglia hemorrhage with 31 cc volume. 3 mm midline shift.   CTA head and neck  01/09/2016 Unchanged RIGHT basal ganglia hemorrhage, favored to represent a hypertensive related bleed.  Volume today 32 mm, essentially unchanged. Mild 3 mm RIGHT-to-LEFT shift. No intracranial or extracranial stenosis, dissection, aneurysm, or vascular malformation.  TTE  01/09/2016 - Left ventricle: The cavity size was normal. Wall thickness was increased in a pattern of moderate LVH. Systolic function was normal. The estimated ejection fraction was in the range of 60% to 65%. Wall motion was normal; there were no regional wall motion abnormalities. Left ventricular diastolic function parameters were normal. - Pulmonary arteries: PA peak pressure: 52 mm Hg (S). Impressions: No cardiac source of emboli was indentified.  CXR 01/09/2016 No active  disease.  MRA head 04/27/16 Overall unremarkable MRA head (without). Again noted is chronic right putamen intracerebral hemorrhage demonstrates expected evolutional change and decrease in size. No definite associated or underlying vascular malformations or aneurysmal dilations.   MRI brain with and without contrast 05/25/16 This MRI of the brain with and without contrast shows the sequela of a hemorrhagic stroke that was acute on the 01/08/2016 CT scan involving the right basal ganglia and adjacent brain. There is subtle enhancement in the periventricular region of the chronic stroke. Enhancement can persist for several months after a hemorrhage. There are no acute findings on the current study.   Component     Latest Ref Rng & Units 01/09/2016  Cholesterol     0 - 200 mg/dL 454 (H)  Triglycerides     <150 mg/dL 098 (H)  HDL Cholesterol     >40 mg/dL 51  Total CHOL/HDL Ratio     RATIO 4.9  VLDL     0 - 40 mg/dL 42 (H)  LDL (calc)     0 - 99 mg/dL 119 (H)  Hemoglobin J4N     4.8 - 5.6 % 5.5  Mean Plasma Glucose     mg/dL 829  TSH     0.350 - 4.500 uIU/mL 0.835  Vitamin B12     180 - 914 pg/mL 449  RPR     Non Reactive Non Reactive  HIV     Non Reactive Non Reactive    Assessment: As you may recall, he is a 61 y.o. Caucasian male with PMH of HTN, HLD, and alcohol abuse admitted no 01/08/16 for R basal ganglia hemorrhage likely due to HTN. CTA head and neck showed no AVM or aneurysm. EF 60-65%. LDL 157 and A1C 5.5. Added zoloft for depression. He was discharged to CIR after stabilization but still has left hemiplegia. Now at home undergoing outpt PT/OT/speech. Reported infrequent occular migraine with rainbow colored imaging at right eye lasting 15min. No associated HA. Repeat MRI and MRA showed no tumor or aneurysm or AVM. Put on ASA 81mg . Follows with Dr. Wynn BankerKirsteins for botox injection. Able to walk with walker now. BP on the low side, decreased metoprolol dose from 100mg  to 50mg  bid.  On this visit, his BP still low and complain of dizziness, still concerning for orthostasis. He will follow up with PCP closely for BP management.  Wife has had contacted office and me in the past and between visits, that pt paranoid, he does not go out but also not allow her to go out without him, questioning her if she talks with other male, etc. However, when I asked her in front of pt, she denied above problem. I also offered psychology or psychiatry referral based on pt depression, both currently declined this offer.   Plan:  - continue ASA and pravastatin for stroke prevention   - continue aggressive PT/OT - Follow up with your primary care physician for stroke risk factor modification. Recommend maintain blood pressure goal <140/80, diabetes with hemoglobin A1c goal below 7.0% and lipids with LDL cholesterol goal below 70 mg/dL.  - check BP at home daily 2-3 times at home and record and bring over to PCP for medication adjustment if needed - follow up with Dr. Wynn BankerKirsteins for rehab and botox injection - continue zoloft - pt will let us know if he needs any further referral of psychiatry or psychology - follow up in 6 months.   I spent more than 25 minutes of face to face time with the patient. Greater than 50% of time was spent in counseling and coordination of care. We discussed continued PT/OT, BP management and dizziness, psychiatry referral if needed.   No orders of the defined types were placed in this encounter.   No orders of the defined types were placed in this encounter.   Patient Instructions  - continue ASA and pravastatin for stroke prevention   - continue aggressive PT/OT - Follow up with your primary care physician for stroke risk factor modification. Recommend maintain blood pressure goal <140/80, diabetes with hemoglobin A1c goal below 7.0% and lipids with LDL cholesterol goal below 70 mg/dL.  - check BP at home daily 2-3 times at home and record and bring over to PCP  for medication adjustment if needed - follow up with Dr. Wynn BankerKirsteins for rehab and botox injection - continue zoloft - let us know if you need any further referral of psychiatry or psychology - follow up in 6 months.    Marvel PlanJindong Culver Feighner, MD PhD [facility] [address], [address] [place], [place] [address] [phone]

## 2017-01-16 NOTE — Patient Instructions (Signed)
-   continue ASA and pravastatin for stroke prevention   - continue aggressive PT/OT - Follow up with your primary care physician for stroke risk factor modification. Recommend maintain blood pressure goal <140/80, diabetes with hemoglobin A1c goal below 7.0% and lipids with LDL cholesterol goal below 70 mg/dL.  - check BP at home daily 2-3 times at home and record and bring over to PCP for medication adjustment if needed - follow up with Dr. Wynn BankerKirsteins for rehab and botox injection - continue zoloft - let us know if you need any further referral of psychiatry or psychology - follow up in 6 months.

## 2017-01-16 NOTE — Telephone Encounter (Signed)
Dr. Wynn BankerKirsteins,   [name] and wife desire referral to speech therapy to update recommendations and address continued cognitive deficits.   If you agree, please send a ST order via epic.  Thank you,  Willa FraterAngela Genevieve Arbaugh, OTR/L [facility] [address] [place], [place]  [address] [phone] phone [phone] 01/16/17 5:37 PM

## 2017-01-16 NOTE — Therapy (Signed)
[place] [address] [place], [place], [address] Phone: [phone]   Fax:  [phone]  Occupational Therapy Treatment  Patient Details  Name: Jon [name] MRN: [id number] Date of Birth: June 29, 1956 Referring Provider: Dr. Alysia Penna  Encounter Date: 01/16/2017      OT End of Session - 01/16/17 1449    Visit Number 9   Number of Visits 13   Date for OT Re-Evaluation 01/19/17   Authorization Type [employer], 30 OT/PT combined, no auth req (has used 9 previously as of new eval, 21 remaining), [employer] effective 08/25/16 per wife   Authorization Time Period ---------------   Authorization - Visit Number 9   Authorization - Number of Visits 21  used 9 visits, 21 remaining   OT Start Time 5844  pt in the restroom   OT Stop Time 1537   OT Time Calculation (min) 46 min   Activity Tolerance Patient tolerated treatment well   Behavior During Therapy [facility] for tasks assessed/performed      Past Medical History:  Diagnosis Date  . Hypertension   . Liver damage   . Stroke [facility])     Past Surgical History:  Procedure Laterality Date  . TONSILLECTOMY AND ADENOIDECTOMY Bilateral    Age 61    There were no vitals filed for this visit.      Subjective Assessment - 01/16/17 1448    Subjective  Pt reports that he cannot even chew gum while walking because it's too difficult   Patient is accompained by: Family member  wife   Pertinent History CVA 01/08/16 with L spastic hemiplegia s/p Botox 10/24/16; HTN, hyperlipidemia, depression, urinary problems, hx of alcohol use, cognitive deficits, confusion, L neglect/inattention   Limitations Pt reports he sometimes does not want to lie down as he is having urinary problems; fall risk, L neglect/inattention, cognitive deficits/confusion    Patient Stated Goals Get my left arm going again    Currently in Pain? No/denies      Reviewed updated HEP (verbally, demonstrated).  Pt  verbalized understanding/returned demo.  Wife verbalized understanding.  Wt. Bearing through [place] in sitting with body on arm movements/lateral wt. Shift with min difficulty, mod I-set-up/min facilitation/cueing.    Functional movement to lift LUE into lap and then onto mat beside pt using L elbow flex/ext control and active relaxation with min cueing and to incr L side awareness.    In supine, AAROM L elbow ext with shoulder and wrist supported with min-mod facilitation.  Then chest press BUEs with ball with mod facilitation/cues.  Bilateral low range functional reach to grasp ball and bring to lap and then hold ball with BUEs to incr LUE functional use as a stabilizer with mod cues/min facilitation, then reaching laterally to L side for incr awareness/activation and in forward reach (low-mid range) with mod cueing/min facilitation.  Pt needed rest breaks due to fatigue with attention/focus.  AAROM L shoulder flex/elbow ext with tilted hemi-walker with min facilitation (stabilizing hemi-walker) and min cueing.   Checked remaining goals and discussed progress/barriers.  Pt/wife verbalize understanding.  Pt fatigues quickly with attention to task and reports difficulty focusing for ambulation. Pt/wife question whether additional ST would help and report desire to pursue ST as there is a separate ST insurance visit limit and attention/cognitive deficits are barriers to progress (as well as L neglect and sensation deficits).  OT Education - 01/16/17 1706    Education Details Strongly reinforced Importance of regular performance of OT HEP and therapy recommendations (use of brace/splints, turning to the L) for progression    Person(s) Educated Patient;Spouse   Methods Explanation;Verbal cues   Comprehension Verbalized understanding          OT Short Term Goals - 01/16/17 1721      OT SHORT TERM GOAL #1   Title Pt will be independent with updated  splint wear/care prn.--check STGs 01/05/17   Baseline ---   Time 4   Period Weeks   Status Achieved  12/20/16     OT SHORT TERM GOAL #2   Title Pt will attend to L side of body/space with no more than min v.c.   Baseline ---   Time 4   Period Weeks   Status Not Met  12/29/16:  min-mod cues, 01/16/17  min-mod cueing           OT Long Term Goals - 01/16/17 1450      OT LONG TERM GOAL #1   Title Pt will be independent with updated HEP.--check LTGs 01/05/17   Time 6   Period Weeks   Status Achieved  01/16/17     OT LONG TERM GOAL #2   Title Pt will demonstrate 20* shoulder flexion in prep for funtional reach.   Time 6   Period Weeks   Status Not Met  01/16/17:  pt able to perform AAROM      OT LONG TERM GOAL #3   Title Pt will demonstrate 25% elbow ext in prep for functional reach.   Time 6   Period Weeks   Status Achieved  01/02/17  met      OT LONG TERM GOAL #4   Title Pt will be able to perform simple wt. bearing through LUE mod I.   Time 6   Period Weeks   Status Partially Met  01/16/17:  inconsistent, min A/set up needed at times               Plan - 01/16/17 1710    Clinical Impression Statement Pt to be evaluated this week for PT and pt/wife report desire to pursue ST due to cognitive deficits as barrier to carryover/further progression with LUE functional use and functional mobility.  Pt has demonstrated slow progress and improved RUE functional use with cueing, but cognitive deficits, sensation deficits, and L neglect has affected progress.  Pt would benefit from holding OT/saving visits to update HEP later in the year.     Rehab Potential Fair   Current Impairments/barriers affecting progress: sensation deficits, L inattention/neglect, cognitive deficits   OT Frequency 2x / week   OT Duration 6 weeks  +eval (may d/c after 4 weeks depending on progress/and to save visits for later in the year)   OT Treatment/Interventions Self-care/ADL training;Therapeutic  exercise;DME and/or AE instruction;Therapist, nutritional;Therapeutic activities;Patient/family education;Splinting;Neuromuscular education;Electrical Stimulation;Passive range of motion;Therapeutic exercises;Visual/perceptual remediation/compensation;Cognitive remediation/compensation;Manual Therapy;Parrafin;Cryotherapy;Ultrasound;Moist Heat;Fluidtherapy   Plan Hold OT due to visit limits, with possible renewal s/p Botox depending of number of visits remaining (pt scheduled to be evaluated for PT this week)   OT Home Exercise Plan Education provided:  Reviewed previous HEP (continues to be appropriate 12/13/16); updates to HEP 12/20/16   Recommended Other Services PT, pt would also benefit from speech therapy for cognition    Consulted and Agree with Plan of Care Patient;Family member/caregiver   Family Member Consulted wife  Patient will benefit from skilled therapeutic intervention in order to improve the following deficits and impairments:  Abnormal gait, Decreased balance, Decreased endurance, Difficulty walking, Impaired sensation, Impaired vision/preception, Pain, Increased edema, Decreased range of motion, Decreased cognition, Decreased activity tolerance, Decreased coordination, Decreased knowledge of use of DME, Decreased safety awareness, Decreased strength, Impaired UE functional use, Decreased knowledge of precautions, Decreased mobility, Impaired perceived functional ability  Visit Diagnosis: Spastic hemiplegia affecting left dominant side, unspecified etiology (HCC)  Visuospatial deficit  Other lack of coordination  Left-sided neglect  Other symptoms and signs involving cognitive functions following cerebral infarction  Other abnormalities of gait and mobility  Other disturbances of skin sensation    Problem List Patient Active Problem List   Diagnosis Date Noted  . Neuropathic pain of left shoulder 05/19/2016  . Alcohol use disorder, severe, dependence ([place])  02/15/2016  . Muscle contusion 02/11/2016  . Spastic hemiplegia affecting nondominant side ([place])   . Muscle spasticity   . Poor appetite   . Hyperlipidemia 01/14/2016  . Depression 01/14/2016  . Obesity 01/14/2016  . Hyperglycemia 01/14/2016  . Urinary tract infection, site not specified 01/14/2016  . Basal ganglia hemorrhage ([place]) 01/14/2016  . Cognitive deficit, post-stroke   . Left hemiparesis ([place])   . Essential hypertension   . Dysphagia, post-stroke   . Gait disturbance, post-stroke   . Hyponatremia   . Thrombocytopenia ([place])   . Hemorrhagic stroke ([place]) R basal ganglia d/t HTN 01/08/2016    [facility] 01/16/2017, 5:24 PM  [employer] [address] [place], [place], [address] Phone: [phone]   Fax:  [phone]  Name: Jon [name] MRN: [id number] Date of Birth: Jul 28, 1955   Vianne Bulls, OTR/L [facility] [address] [place], [place]  [address] [phone] phone [phone] 01/16/17 5:33 PM

## 2017-01-17 NOTE — Telephone Encounter (Signed)
Please send order for outpatient speech therapy evaluation

## 2017-01-17 NOTE — Telephone Encounter (Signed)
Order placed

## 2017-01-18 DIAGNOSIS — K709 Alcoholic liver disease, unspecified: Secondary | ICD-10-CM | POA: Diagnosis not present

## 2017-01-18 DIAGNOSIS — K580 Irritable bowel syndrome with diarrhea: Secondary | ICD-10-CM | POA: Diagnosis not present

## 2017-01-19 ENCOUNTER — Ambulatory Visit: Payer: BLUE CROSS/BLUE SHIELD | Admitting: Physical Therapy

## 2017-01-19 ENCOUNTER — Encounter: Payer: Self-pay | Admitting: Physical Therapy

## 2017-01-19 DIAGNOSIS — R414 Neurologic neglect syndrome: Secondary | ICD-10-CM | POA: Diagnosis not present

## 2017-01-19 DIAGNOSIS — M6281 Muscle weakness (generalized): Secondary | ICD-10-CM

## 2017-01-19 DIAGNOSIS — I69318 Other symptoms and signs involving cognitive functions following cerebral infarction: Secondary | ICD-10-CM | POA: Diagnosis not present

## 2017-01-19 DIAGNOSIS — G8112 Spastic hemiplegia affecting left dominant side: Secondary | ICD-10-CM | POA: Diagnosis not present

## 2017-01-19 DIAGNOSIS — R2689 Other abnormalities of gait and mobility: Secondary | ICD-10-CM | POA: Diagnosis not present

## 2017-01-19 DIAGNOSIS — R41842 Visuospatial deficit: Secondary | ICD-10-CM | POA: Diagnosis not present

## 2017-01-19 DIAGNOSIS — R278 Other lack of coordination: Secondary | ICD-10-CM | POA: Diagnosis not present

## 2017-01-19 DIAGNOSIS — R208 Other disturbances of skin sensation: Secondary | ICD-10-CM

## 2017-01-19 DIAGNOSIS — I69319 Unspecified symptoms and signs involving cognitive functions following cerebral infarction: Secondary | ICD-10-CM | POA: Diagnosis not present

## 2017-01-19 DIAGNOSIS — G8194 Hemiplegia, unspecified affecting left nondominant side: Secondary | ICD-10-CM

## 2017-01-19 NOTE — Therapy (Addendum)
[facility] [address] [place], [place], [address] Phone: [phone]   Fax:  [phone]  Physical Therapy Evaluation  Patient Details  Name: Jon [name] MRN: [id number] Date of Birth: 08-18-1955 Referring Provider: Claudette Laws  Encounter Date: 01/19/2017      PT End of Session - 01/19/17 2113    Visit Number 1   Number of Visits 5   Date for PT Re-Evaluation 02/18/17   Authorization Type UHC    Authorization - Visit Number 19  OT used 18 visits this year   Authorization - Number of Visits 30   Activity Tolerance Patient tolerated treatment well   Behavior During Therapy [facility] for tasks assessed/performed      Past Medical History:  Diagnosis Date  . Hypertension   . Liver damage   . Stroke [facility])     Past Surgical History:  Procedure Laterality Date  . TONSILLECTOMY AND ADENOIDECTOMY Bilateral    Age 61    There were no vitals filed for this visit.       Subjective Assessment - 01/19/17 1452    Subjective Pt reports no falls in more than a year. Not since he came home from the hospital. Stumbles sometimes.    Pertinent History HTN, HLD, and alcohol abuse    Patient Stated Goals would like to get rid of this dizziness; "drunk"    Currently in Pain? No/denies            [facility] PT Assessment - 01/19/17 0001      Assessment   Medical Diagnosis Lt hemiparesis; CVA 01/08/16   Referring Provider Claudette Laws   Onset Date/Surgical Date 01/08/16   Hand Dominance Left   Prior Therapy CIR, HHPT, OPPT     Precautions   Precautions Fall   Precaution Comments left neglect     Balance Screen   Has the patient fallen in the past 6 months No   Has the patient had a decrease in activity level because of a fear of falling?  No   Is the patient reluctant to leave their home because of a fear of falling?  No     Home Environment   Living Environment Private residence   Living Arrangements  Spouse/significant other   Available Help at Discharge Family;Available 24 hours/day   Type of Home House   Home Access Ramped entrance;Stairs to enter   Entrance Stairs-Number of Steps 3   Entrance Stairs-Rails Right;Left   Home Layout One level   Home Equipment Wheelchair - manual;Tub bench;Grab bars - tub/shower;Bedside commode;Walker - 2 wheels;Grab bars - toilet;Cane - quad  hemiwalker; shoes have leather toe   Additional Comments does not drive; uses wheelchair if they go into a store (because it's too slow to walk)     Prior Function   Level of Independence Independent   Vocation Full time employment   Vocation Requirements Pt worked as a Armed forces logistics/support/administrative officer)   Leisure painting and piano     Cognition   Area of Impairment Safety/judgement   Safety/Judgement Decreased awareness of safety;Decreased awareness of Tree surgeon and Judgement Comments --  walks to bathroom even when he feels dizzy/drunk without wif   Awareness Intellectual     Observation/Other Assessments   Observations walks into clinic with RW with Lt walker splint, dragging lt foot/toe of shoe     Sensation   Light Touch Impaired by gross assessment   Additional Comments "feels like my LLE is  wearing 4-5 pairs of pants"     Coordination   Gross Motor Movements are Fluid and Coordinated No  due to weakness     Posture/Postural Control   Posture/Postural Control Postural limitations   Postural Limitations Rounded Shoulders;Forward head   Posture Comments Lt pelvis/hip retracted in standing/walking     Tone   Assessment Location Left Lower Extremity     ROM / Strength   AROM / PROM / Strength AROM;Strength;PROM     AROM   Overall AROM  Deficits     PROM   Overall PROM  Within functional limits for tasks performed     Strength   Overall Strength Deficits   Strength Assessment Site Hip;Knee;Ankle   Right/Left Hip Left   Left Hip Flexion 3/5   Left Hip ABduction 4/5   Left Hip ADduction 4/5    Right/Left Knee Left   Left Knee Flexion 2+/5   Left Knee Extension 3-/5   Right/Left Ankle Left   Left Ankle Dorsiflexion 0/5   Left Ankle Plantar Flexion 1/5     Transfers   Transfers Sit to Stand;Stand to Sit   Sit to Stand 6: Modified independent (Device/Increase time)   Sit to Stand Details (indicate cue type and reason) using RUE on armrest   Five time sit to stand comments  19.56  using RUE on armrest   Stand to Sit 5: Supervision;With upper extremity assist   Stand to Sit Details Does not fully turn around prior to intiiating sitting, "parks" RW off to the side     Ambulation/Gait   Ambulation/Gait Yes   Ambulation/Gait Assistance 4: Min guard   Ambulation/Gait Assistance Details close guarding due to pt without AFO or shoe with leather toe cap (new sneakers) with Lt toe dragging; Lt hand pops out of velcro strap twice while walking   Ambulation Distance (Feet) 50 Feet  120   Assistive device Rolling walker   Gait Pattern Step-through pattern;Decreased hip/knee flexion - left;Decreased stance time - left;Lateral trunk lean to right;Decreased weight shift to left;Decreased step length - right;Trunk rotated posteriorly on left;Decreased step length - left;Left hip hike;Left steppage;Poor foot clearance - left   Ambulation Surface Level   Gait velocity 0.46 ft/sec   Stairs Yes   Stairs Assistance 4: Min guard   Stair Management Technique One rail Right;One rail Left;Step to pattern;Forwards  some difficulty placing lt foot when descending   Number of Stairs 4   Height of Stairs 6     LLE Tone   LLE Tone Mild            Objective measurements completed on examination: See above findings.                  PT Education - 01/19/17 1715    Education provided Yes   Education Details results of evaluation; PT POC; important to use either AFO or shoe with leather toe cap (and maybe both); pt to bring HEP from last PT, AFO, shoe with toe cap and hemiwalker  next visit   Person(s) Educated Patient;Spouse   Methods Explanation   Comprehension Verbalized understanding             PT Long Term Goals - 01/20/17 0737      PT LONG TERM GOAL #1   Title Patient will perform HEP independently (or with wife's assistance if exercises require assist). (TARGET for all LTGs: 02/18/17)   Time 4   Period Weeks   Status New  PT LONG TERM GOAL #2   Title Patient will ambulate 50 feet with supervision and LRAD and appropriate orthotic/shoe modifications to prevent lt toe/foot dragging to reduce fall risk.    Time 4   Period Weeks   Status New     PT LONG TERM GOAL #3   Title Patient will improve gait velocity to >=0.60 ft/sec with LRAD and supervision in working towards a functional gait velocity to reduce fall risk.   Time 4   Period Weeks   Status New     PT LONG TERM GOAL #4   Title Patient will perform 5 times sit to stand with use of RUE from chair with armrests in <16 seconds, indicating improved LE strength and lesser fall risk.    Time 4   Period Weeks   Status New                Plan - Jan 27, 2017 1725    Clinical Impression Statement Patient referred to PT for continued lt sided weakness from CVA with resulting gait deficits. Patient presented with the deficits outlined below and potentially can benefit from PT with use of interventions listed below. Patient has completed OPPT previously and admits he no longer does the exercises he was given.    History and Personal Factors relevant to plan of care: history: > 1 yr since CVA; personal factors: safety awareness, motivation, expected slow progression   Clinical Presentation Stable   Clinical Presentation due to: >1 year post-CVA   Rehab Potential Fair   Clinical Impairments Affecting Rehab Potential co-morbidities   PT Frequency 1x / week     PT Duration 4 weeks   PT Treatment/Interventions ADLs/Self Care Home Management;Biofeedback;Canalith Repostioning;Electrical  Stimulation;Cognitive remediation;Neuromuscular re-education;Balance training;Therapeutic exercise;Therapeutic activities;Functional mobility training;Stair training;Gait training;DME Instruction;Orthotic Fit/Training;Patient/family education;Wheelchair mobility training;Manual techniques;Vestibular   PT Next Visit Plan assess gait with AFO, shoe with toe cap, hemiwalker (anticipate rec use of RW to incorporate use of LUE--reinforce OT rec's); review prior HEP & update as necessary   PT Home Exercise Plan --   Consulted and Agree with Plan of Care Patient;Family member/caregiver   Family Member Consulted wife-[name]      Patient will benefit from skilled therapeutic intervention in order to improve the following deficits and impairments:  Abnormal gait, Decreased activity tolerance, Decreased balance, Decreased cognition, Decreased knowledge of precautions, Decreased knowledge of use of DME, Decreased mobility, Decreased safety awareness, Decreased strength, Difficulty walking, Dizziness, Impaired sensation, Impaired tone, Impaired UE functional use, Impaired vision/preception, Postural dysfunction  Visit Diagnosis: Other abnormalities of gait and mobility - Plan: PT plan of care cert/re-cert, CANCELED: PT plan of care cert/re-cert  Other disturbances of skin sensation - Plan: PT plan of care cert/re-cert, CANCELED: PT plan of care cert/re-cert  Left hemiparesis (HCC) - Plan: PT plan of care cert/re-cert, CANCELED: PT plan of care cert/re-cert  Muscle weakness (generalized) - Plan: PT plan of care cert/re-cert, CANCELED: PT plan of care cert/re-cert      G-Codes - 01-27-17 2108-12-08    Functional Assessment Tool Used (Outpatient Only) gait velocity with RW and Lt walker splint;    Functional Limitation Mobility: Walking and moving around   Mobility: Walking and Moving Around Current Status [phone]) At least 60 percent but less than 80 percent impaired, limited or restricted   Mobility: Walking  and Moving Around Goal Status [phone]) At least 40 percent but less than 60 percent impaired, limited or restricted       Problem List Patient Active  Problem List   Diagnosis Date Noted  . Neuropathic pain of left shoulder 05/19/2016  . Alcohol use disorder, severe, dependence (HCC) 02/15/2016  . Muscle contusion 02/11/2016  . Spastic hemiplegia affecting nondominant side (HCC)   . Muscle spasticity   . Poor appetite   . Hyperlipidemia 01/14/2016  . Depression 01/14/2016  . Obesity 01/14/2016  . Hyperglycemia 01/14/2016  . Urinary tract infection, site not specified 01/14/2016  . Basal ganglia hemorrhage (HCC) 01/14/2016  . Hemiparesis affecting nondominant side as late effect of cerebrovascular accident (HCC)   . Cognitive deficit, post-stroke   . Left hemiparesis (HCC)   . Essential hypertension   . Dysphagia, post-stroke   . Gait disturbance, post-stroke   . Hyponatremia   . Thrombocytopenia (HCC)   . Hemorrhagic stroke (HCC) R basal ganglia d/t HTN 01/08/2016    Zena AmosLynn P Shirleyann Montero, PT 01/20/2017, 7:52 AM  [facility] [address] [place], [place], [address] Phone: [phone]   Fax:  [phone]  Name: Tawanna CoolerGeorge D Domine MRN: [id number] Date of Birth: 08-25-1955

## 2017-01-20 NOTE — Addendum Note (Signed)
Addended by: Zena AmosSASSER, Satoru Milich P on: 01/20/2017 08:03 AM   Modules accepted: Orders

## 2017-01-23 ENCOUNTER — Encounter: Payer: Self-pay | Admitting: Physical Therapy

## 2017-01-23 ENCOUNTER — Ambulatory Visit: Payer: BLUE CROSS/BLUE SHIELD | Attending: Physical Medicine & Rehabilitation | Admitting: Physical Therapy

## 2017-01-23 DIAGNOSIS — G8194 Hemiplegia, unspecified affecting left nondominant side: Secondary | ICD-10-CM | POA: Insufficient documentation

## 2017-01-23 DIAGNOSIS — R208 Other disturbances of skin sensation: Secondary | ICD-10-CM | POA: Diagnosis not present

## 2017-01-23 DIAGNOSIS — R2689 Other abnormalities of gait and mobility: Secondary | ICD-10-CM | POA: Insufficient documentation

## 2017-01-23 DIAGNOSIS — M6281 Muscle weakness (generalized): Secondary | ICD-10-CM | POA: Insufficient documentation

## 2017-01-23 NOTE — Therapy (Signed)
[facility] [address] [place], [place], [address] Phone: [phone]   Fax:  [phone]  Physical Therapy Treatment  Patient Details  Name: Jon [name] MRN: [id number] Date of Birth: 1956/05/22 Referring Provider: Claudette Laws  Encounter Date: 01/23/2017      PT End of Session - 01/23/17 1653    Visit Number 2   Number of Visits 5   Date for PT Re-Evaluation 02/18/17   Authorization Type UHC    Authorization - Visit Number 20  OT used 18 visits this year   Authorization - Number of Visits 30   PT Start Time 0935   PT Stop Time 1017   PT Time Calculation (min) 42 min   Equipment Utilized During Treatment Gait belt   Activity Tolerance Patient tolerated treatment well;Treatment limited secondary to medical complications (Comment)  dizziness   Behavior During Therapy Flat affect      Past Medical History:  Diagnosis Date  . Hypertension   . Liver damage   . Stroke [facility])     Past Surgical History:  Procedure Laterality Date  . TONSILLECTOMY AND ADENOIDECTOMY Bilateral    Age 61    There were no vitals filed for this visit.      Subjective Assessment - 01/23/17 1628    Subjective Reports his home is too tight for him to walk with his RW. Wife shaking her head in disagreement.    Patient is accompained by: --  wife   Pertinent History HTN, HLD, and alcohol abuse    Patient Stated Goals would like to get rid of this dizziness; "drunk"                          [facility] Adult PT Treatment/Exercise - 01/23/17 0001      Transfers   Transfers Sit to Stand;Stand to Sit   Sit to Stand 6: Modified independent (Device/Increase time);4: Min assist;With upper extremity assist   Sit to Stand Details (indicate cue type and reason) with hemi-walker modified Independent; with RW with walker splint, he requires min assist to put Lt hand into walker splint   Stand to Sit 4: Min guard   Stand  to Sit Details reports dizziness and then does not fully turn with RW to safely sit     Ambulation/Gait   Ambulation/Gait Assistance 4: Min assist   Ambulation/Gait Assistance Details assist to bring lt hemipelvis forward and downward (trying to reach heel to contact floor first when using Lt AFO) Pt did not catch his toes at all while wearing AFO   Ambulation Distance (Feet) 100 Feet  20, 10   Assistive device Rolling walker;Hemi-walker   Gait Pattern Step-through pattern;Decreased hip/knee flexion - left;Decreased stance time - left;Lateral trunk lean to right;Decreased weight shift to left;Decreased step length - right;Trunk rotated posteriorly on left;Decreased step length - left;Left hip hike;Left steppage;Poor foot clearance - left   Ambulation Surface Level             Balance Exercises - 01/23/17 1648      Balance Exercises: Standing   Standing Eyes Opened Wide (BOA);Head turns;Solid surface           PT Education - 01/23/17 1649    Education provided Yes   Education Details See additions to HEP (pt reporting he wants to improve his balance). Rationale for using AFO (decr fall risk, joint protection at knee). Rationale for using RW (incorporate UE and  assist with more symmetrical gait and decr energy expenditure)   Person(s) Educated Patient;Spouse   Methods Explanation;Handout;Demonstration;Tactile cues;Verbal cues   Comprehension Verbalized understanding;Returned demonstration;Verbal cues required;Tactile cues required;Need further instruction          PT Short Term Goals - 01/20/17 0732            PT Long Term Goals - 01/20/17 0737      PT LONG TERM GOAL #1   Title Patient will perform HEP independently (or with wife's assistance if exercises require assist). (TARGET for all LTGs: 02/18/17)   Time 4   Period Weeks   Status New     PT LONG TERM GOAL #2   Title Patient will ambulate 50 feet with supervision and LRAD and appropriate orthotic/shoe  modifications to prevent lt toe/foot dragging to reduce fall risk.    Time 4   Period Weeks   Status New     PT LONG TERM GOAL #3   Title Patient will improve gait velocity to >=0.60 ft/sec with LRAD and supervision in working towards a functional gait velocity to reduce fall risk.   Time 4   Period Weeks   Status New     PT LONG TERM GOAL #4   Title Patient will perform 5 times sit to stand with use of RUE from chair with armrests in <16 seconds, indicating improved LE strength and lesser fall risk.    Time 4   Period Weeks   Status New               Plan - 01/23/17 1655    Clinical Impression Statement Session focused on assessing safety and quality of gait (especially with regards to energy expenditure) wiht and without AFO and hemi-walker vs RW with Lt walker splint. Patient became dizzy during second trial of gait and reported "this is what happens all the time." BP checked (115/63 seated after walk; 106/64 seated after 3 minutes). Will continue to monitor for orthostasis   Rehab Potential Fair   Clinical Impairments Affecting Rehab Potential co-morbidities   PT Frequency 1x / week   PT Duration 4 weeks   PT Treatment/Interventions ADLs/Self Care Home Management;Biofeedback;Canalith Repostioning;Electrical Stimulation;Cognitive remediation;Neuromuscular re-education;Balance training;Therapeutic exercise;Therapeutic activities;Functional mobility training;Stair training;Gait training;DME Instruction;Orthotic Fit/Training;Patient/family education;Wheelchair mobility training;Manual techniques;Vestibular   PT Next Visit Plan ask pt for homework re: list of places in his home where RW will not fit & review prior HEP & update as necessary; gait with AFO, shoe with toe cap, RW to incorporate use of LUE;    Consulted and Agree with Plan of Care Patient;Family member/caregiver   Family Member Consulted wife-[name]      Patient will benefit from skilled therapeutic intervention in  order to improve the following deficits and impairments:  Abnormal gait, Decreased activity tolerance, Decreased balance, Decreased cognition, Decreased knowledge of precautions, Decreased knowledge of use of DME, Decreased mobility, Decreased safety awareness, Decreased strength, Difficulty walking, Dizziness, Impaired sensation, Impaired tone, Impaired UE functional use, Impaired vision/preception, Postural dysfunction  Visit Diagnosis: Other abnormalities of gait and mobility  Other disturbances of skin sensation  Muscle weakness (generalized)     Problem List Patient Active Problem List   Diagnosis Date Noted  . Neuropathic pain of left shoulder 05/19/2016  . Alcohol use disorder, severe, dependence (HCC) 02/15/2016  . Muscle contusion 02/11/2016  . Spastic hemiplegia affecting nondominant side (HCC)   . Muscle spasticity   . Poor appetite   . Hyperlipidemia 01/14/2016  . Depression 01/14/2016  .  Obesity 01/14/2016  . Hyperglycemia 01/14/2016  . Urinary tract infection, site not specified 01/14/2016  . Basal ganglia hemorrhage (HCC) 01/14/2016  . Hemiparesis affecting nondominant side as late effect of cerebrovascular accident (HCC)   . Cognitive deficit, post-stroke   . Left hemiparesis (HCC)   . Essential hypertension   . Dysphagia, post-stroke   . Gait disturbance, post-stroke   . Hyponatremia   . Thrombocytopenia (HCC)   . Hemorrhagic stroke (HCC) R basal ganglia d/t HTN 01/08/2016    Zena Amos, PT 01/23/2017, 5:03 PM  [facility] [address] [place], [place], [address] Phone: [phone]   Fax:  [phone]  Name: Jon [name] MRN: [id number] Date of Birth: October 21, 1955

## 2017-01-23 NOTE — Patient Instructions (Signed)
Balance: Eyes Open - Bilateral (Varied Surfaces)    Stand, feet shoulder width, eyes open. Maintain balance 30 seconds. Repeat 3 times per set. Do 1 sets per session. Do 5 sessions per week.                          Feet Apart, Head Motion - Eyes Open    With eyes open, feet apart, move head slowly: up and down, and then right and left Repeat 10 times per session. Do 1 sessions per day.  Copyright  VHI. All rights reserved.                         Feet Together, Head Motion - Eyes Open    With eyes open, feet together, up and down, and then right and left Repeat 10 times per session. Do 1 sessions per day.  Copyright  VHI. All rights reserved.                         Feet Partial Heel-Toe, Head Motion - Eyes Open    With eyes open, right foot partially in front of the other,  up and down, and then right and left Repeat 10 times per session. Do 1 sessions per day. Copyright  VHI. All rights reserved.

## 2017-01-24 ENCOUNTER — Telehealth: Payer: Self-pay | Admitting: *Deleted

## 2017-01-24 NOTE — Telephone Encounter (Signed)
Patient's wife left a message asking for Dr. Wynn BankerKirsteins to write a letter of medical necessity for pt's insurance company with the hopes of getting more therapy visits for physical therapy and occupational therapy.  I contacted patient and explained that I sent the message to Dr. Wynn BankerKirsteins, however, he will not be able to handle the request till next week.

## 2017-01-30 ENCOUNTER — Ambulatory Visit: Payer: Self-pay | Admitting: Physical Medicine & Rehabilitation

## 2017-01-30 ENCOUNTER — Ambulatory Visit: Payer: BLUE CROSS/BLUE SHIELD | Admitting: Physical Therapy

## 2017-01-30 ENCOUNTER — Encounter: Payer: Self-pay | Admitting: Physical Therapy

## 2017-01-30 DIAGNOSIS — M6281 Muscle weakness (generalized): Secondary | ICD-10-CM | POA: Diagnosis not present

## 2017-01-30 DIAGNOSIS — R2689 Other abnormalities of gait and mobility: Secondary | ICD-10-CM | POA: Diagnosis not present

## 2017-01-30 DIAGNOSIS — R208 Other disturbances of skin sensation: Secondary | ICD-10-CM | POA: Diagnosis not present

## 2017-01-30 DIAGNOSIS — G8194 Hemiplegia, unspecified affecting left nondominant side: Secondary | ICD-10-CM | POA: Diagnosis not present

## 2017-01-30 NOTE — Therapy (Signed)
[facility] [address] [place], [place], [address] Phone: [phone]   Fax:  [phone]  Physical Therapy Treatment  Patient Details  Name: Jon [name] MRN: [id number] Date of Birth: 09/02/1955 Referring Provider: Claudette Laws  Encounter Date: 01/30/2017      PT End of Session - 01/30/17 1706    Visit Number 3   Number of Visits 5   Date for PT Re-Evaluation 02/18/17   Authorization Type UHC    Authorization - Visit Number 21  OT used 18 visits this year   Authorization - Number of Visits 30   PT Start Time 1530   PT Stop Time 1617   PT Time Calculation (min) 47 min   Equipment Utilized During Treatment Gait belt   Activity Tolerance Patient tolerated treatment well   Behavior During Therapy Flat affect      Past Medical History:  Diagnosis Date  . Hypertension   . Liver damage   . Stroke [facility])     Past Surgical History:  Procedure Laterality Date  . TONSILLECTOMY AND ADENOIDECTOMY Bilateral    Age 61    There were no vitals filed for this visit.      Subjective Assessment - 01/30/17 1538    Subjective Cannot get RW anywhere in his home except main room and kitchen.    Patient is accompained by: Family member  wife   Pertinent History HTN, HLD, and alcohol abuse    Patient Stated Goals would like to get rid of this dizziness; "drunk"    Currently in Pain? No/denies                         [facility] Adult PT Treatment/Exercise - 01/30/17 1650      Transfers   Transfers Sit to Stand;Stand to Sit   Sit to Stand 6: Modified independent (Device/Increase time)   Sit to Stand Details (indicate cue type and reason) uses RLE primarily and at times pushes posterior leg against surface to "hinge up"   Stand to Sit 6: Modified independent (Device/Increase time)     Ambulation/Gait   Ambulation/Gait Assistance 4: Min assist   Ambulation/Gait Assistance Details walked with  hemiwalker modified independent, however places HW more in front of his body than beside ("I don't have room to walk with it beside me at home") and therefore leads with RLE, trunk rotated to left; with SPC with quad tip, pt much more symmetrical (with vc and facilitation to incr weigth-bearing on LLE); also walked with no device with min assist for lt wt-shift   Ambulation Distance (Feet) 100 Feet  60,60,120   Assistive device Hemi-walker;Straight cane;None   Gait Pattern Step-through pattern;Decreased hip/knee flexion - left;Decreased stance time - left;Lateral trunk lean to right;Decreased weight shift to left;Decreased step length - right;Trunk rotated posteriorly on left;Decreased step length - left;Left hip hike;Left steppage;Poor foot clearance - left   Ambulation Surface Level;Indoor   Pre-Gait Activities mirror in front of pt, lt wt-shift and step Rt foot up on to 4" step. without UE support, pt very anxious therefore used SPC in Rt hand with vc for less WB thru hand   Gait Comments Pt/wife report the RW with Lt hand splint will only fit in the main room and kitchen of their home. Pt eventually agreed to practice with RW in these areas to incr use of LUE. In clinic, he looked the best with [facility] with quad tip and AFO  and pt agreeable to work towards modified independence with his cane with quad tip.                 PT Education - 01/30/17 1704    Education provided Yes   Education Details rationale for use of RW with lt walker splint in areas in home where he can fit walker (for practice)   Person(s) Educated Patient;Spouse   Methods Explanation   Comprehension Verbalized understanding          PT Short Term Goals - 01/20/17 0732            PT Long Term Goals - 01/20/17 0737      PT LONG TERM GOAL #1   Title Patient will perform HEP independently (or with wife's assistance if exercises require assist). (TARGET for all LTGs: 02/18/17)   Time 4   Period Weeks   Status  New     PT LONG TERM GOAL #2   Title Patient will ambulate 50 feet with supervision and LRAD and appropriate orthotic/shoe modifications to prevent lt toe/foot dragging to reduce fall risk.    Time 4   Period Weeks   Status New     PT LONG TERM GOAL #3   Title Patient will improve gait velocity to >=0.60 ft/sec with LRAD and supervision in working towards a functional gait velocity to reduce fall risk.   Time 4   Period Weeks   Status New     PT LONG TERM GOAL #4   Title Patient will perform 5 times sit to stand with use of RUE from chair with armrests in <16 seconds, indicating improved LE strength and lesser fall risk.    Time 4   Period Weeks   Status New               Plan - 01/30/17 1707    Clinical Impression Statement Patient arrived wearing Lt AFO! Session focused on gait training and continuing to assess best DME to assist pt in his home with narrow doorways. Ultimately did best with SPC with quad tip (which pt already owns, but had been using either [facility] or hemiwalker). Continue to work on improved weight shift over LLE for improved advanccement of RLE and less energy expenditure. Will continue to work towards LTGs.    Rehab Potential Fair   Clinical Impairments Affecting Rehab Potential co-morbidities   PT Frequency 1x / week   PT Duration 4 weeks   PT Treatment/Interventions ADLs/Self Care Home Management;Biofeedback;Canalith Repostioning;Electrical Stimulation;Cognitive remediation;Neuromuscular re-education;Balance training;Therapeutic exercise;Therapeutic activities;Functional mobility training;Stair training;Gait training;DME Instruction;Orthotic Fit/Training;Patient/family education;Wheelchair mobility training;Manual techniques;Vestibular   PT Next Visit Plan review prior HEP (see PT note 06/14/16, 04/22/16, 04/12/16 & update as necessary); gait with AFO, SPC with quad tip   Consulted and Agree with Plan of Care Patient;Family member/caregiver   Family Member  Consulted wife-[name]      Patient will benefit from skilled therapeutic intervention in order to improve the following deficits and impairments:  Abnormal gait, Decreased activity tolerance, Decreased balance, Decreased cognition, Decreased knowledge of precautions, Decreased knowledge of use of DME, Decreased mobility, Decreased safety awareness, Decreased strength, Difficulty walking, Dizziness, Impaired sensation, Impaired tone, Impaired UE functional use, Impaired vision/preception, Postural dysfunction  Visit Diagnosis: Other abnormalities of gait and mobility     Problem List Patient Active Problem List   Diagnosis Date Noted  . Neuropathic pain of left shoulder 05/19/2016  . Alcohol use disorder, severe, dependence (HCC) 02/15/2016  . Muscle contusion  02/11/2016  . Spastic hemiplegia affecting nondominant side (HCC)   . Muscle spasticity   . Poor appetite   . Hyperlipidemia 01/14/2016  . Depression 01/14/2016  . Obesity 01/14/2016  . Hyperglycemia 01/14/2016  . Urinary tract infection, site not specified 01/14/2016  . Basal ganglia hemorrhage (HCC) 01/14/2016  . Hemiparesis affecting nondominant side as late effect of cerebrovascular accident (HCC)   . Cognitive deficit, post-stroke   . Left hemiparesis (HCC)   . Essential hypertension   . Dysphagia, post-stroke   . Gait disturbance, post-stroke   . Hyponatremia   . Thrombocytopenia (HCC)   . Hemorrhagic stroke (HCC) R basal ganglia d/t HTN 01/08/2016    Zena AmosLynn P Chanel Mcadams, PT 01/30/2017, 5:14 PM  [facility] [address] [place], [place], [address] Phone: [phone]   Fax:  [phone]  Name: Tawanna CoolerGeorge D Silbernagel MRN: [id number] Date of Birth: May 14, 1956

## 2017-02-01 NOTE — Telephone Encounter (Signed)
As mentioned in my note pt has plateaued and further therapy is not medically necessary at this time.

## 2017-02-06 ENCOUNTER — Ambulatory Visit: Payer: BLUE CROSS/BLUE SHIELD | Admitting: Physical Therapy

## 2017-02-06 ENCOUNTER — Encounter: Payer: Self-pay | Admitting: Physical Therapy

## 2017-02-06 DIAGNOSIS — G8194 Hemiplegia, unspecified affecting left nondominant side: Secondary | ICD-10-CM | POA: Diagnosis not present

## 2017-02-06 DIAGNOSIS — M6281 Muscle weakness (generalized): Secondary | ICD-10-CM

## 2017-02-06 DIAGNOSIS — R208 Other disturbances of skin sensation: Secondary | ICD-10-CM

## 2017-02-06 DIAGNOSIS — R2689 Other abnormalities of gait and mobility: Secondary | ICD-10-CM | POA: Diagnosis not present

## 2017-02-06 NOTE — Telephone Encounter (Signed)
Contacted pt's wife and explained Dr. Wynn BankerKirsteins determination.  She said she understood and thanked for the call back

## 2017-02-06 NOTE — Therapy (Signed)
[facility] [address] [place], [place], [address] Phone: [phone]   Fax:  [phone]  Physical Therapy Treatment  Patient Details  Name: Jon [name] MRN: [id number] Date of Birth: Dec 02, 1955 Referring Provider: Claudette Laws  Encounter Date: 02/06/2017      PT End of Session - 02/06/17 1322    Visit Number 4   Number of Visits 5   Date for PT Re-Evaluation 02/18/17   Authorization Type UHC    PT Start Time 1150   PT Stop Time 1230   PT Time Calculation (min) 40 min   Equipment Utilized During Treatment Gait belt   Activity Tolerance Patient tolerated treatment well   Behavior During Therapy Flat affect      Past Medical History:  Diagnosis Date  . Hypertension   . Liver damage   . Stroke [facility])     Past Surgical History:  Procedure Laterality Date  . TONSILLECTOMY AND ADENOIDECTOMY Bilateral    Age 61    There were no vitals filed for this visit.      Subjective Assessment - 02/06/17 1158    Subjective Patient reported no new falls or pain today. He stated that he is using his cane more at home.    Patient is accompained by: Family member   Currently in Pain? No/denies              [facility] Adult PT Treatment/Exercise - 02/06/17 1311      Ambulation/Gait   Ambulation/Gait Yes   Ambulation/Gait Assistance 4: Min assist   Ambulation Distance (Feet) 115 Feet   Assistive device Straight cane   Gait Pattern Step-through pattern;Decreased hip/knee flexion - left;Decreased stance time - left;Lateral trunk lean to right;Decreased weight shift to left;Decreased step length - right;Trunk rotated posteriorly on left;Decreased step length - left;Left hip hike;Left steppage;Poor foot clearance - left   Ambulation Surface Level;Indoor   Gait Comments cues to lengthen steps and to shift weight to left side      Neuro Re-ed    Neuro Re-ed Details  in front of mirror in parallel bars, worked on  lateral weight shifting and then weight shifting with SLS, RUE support on bar when in SLS, min assist to help with weigh shifting     Exercises   Exercises Lumbar;Knee/Hip     Lumbar Exercises: Supine   Bridge 10 reps;3 seconds   Bridge Limitations verbal cues  and min assist to keep left leg from falling to side      Knee/Hip Exercises: Seated   Abduction/Adduction  Strengthening;Left;1 set;10 reps   Abd/Adduction Limitations marching leg out to side and then back in, quickly fatigued and needed UE braced on mat to help move leg      Knee/Hip Exercises: Supine   Other Supine Knee/Hip Exercises supine marches, 10 each leg, min assistance to help control left leg when lifting and lowering it           PT Education - 02/06/17 1327    Education provided Yes   Education Details encouraged continued practice of home exercises especially vestibular training for dizziness    Person(s) Educated Patient   Methods Explanation   Comprehension Verbalized understanding          PT Short Term Goals - 01/20/17 0732            PT Long Term Goals - 01/20/17 0737      PT LONG TERM GOAL #1   Title Patient  will perform HEP independently (or with wife's assistance if exercises require assist). (TARGET for all LTGs: 02/18/17)   Time 4   Period Weeks   Status New     PT LONG TERM GOAL #2   Title Patient will ambulate 50 feet with supervision and LRAD and appropriate orthotic/shoe modifications to prevent lt toe/foot dragging to reduce fall risk.    Time 4   Period Weeks   Status New     PT LONG TERM GOAL #3   Title Patient will improve gait velocity to >=0.60 ft/sec with LRAD and supervision in working towards a functional gait velocity to reduce fall risk.   Time 4   Period Weeks   Status New     PT LONG TERM GOAL #4   Title Patient will perform 5 times sit to stand with use of RUE from chair with armrests in <16 seconds, indicating improved LE strength and lesser fall risk.     Time 4   Period Weeks   Status New               Plan - 02/06/17 1323    Clinical Impression Statement Today's session focused on continued [facility] and strengthing. Continued to work on weight shifitng to left side to improve gait sequencing. Patient tolerated treatment well but did become dizzy when performing weight shifting exercises in the parallel bars. He would benefit from continued PT to address unmet goals.    Clinical Impairments Affecting Rehab Potential co-morbidities   PT Frequency 1x / week   PT Duration 4 weeks   PT Treatment/Interventions ADLs/Self Care Home Management;Biofeedback;Canalith Repostioning;Electrical Stimulation;Cognitive remediation;Neuromuscular re-education;Balance training;Therapeutic exercise;Therapeutic activities;Functional mobility training;Stair training;Gait training;DME Instruction;Orthotic Fit/Training;Patient/family education;Wheelchair mobility training;Manual techniques;Vestibular   PT Next Visit Plan gait with AFO/SPC with quad tip, update HEP as needed   Consulted and Agree with Plan of Care Patient;Family member/caregiver   Family Member Consulted wife-[name]      Patient will benefit from skilled therapeutic intervention in order to improve the following deficits and impairments:  Abnormal gait, Decreased activity tolerance, Decreased balance, Decreased cognition, Decreased knowledge of precautions, Decreased knowledge of use of DME, Decreased mobility, Decreased safety awareness, Decreased strength, Difficulty walking, Dizziness, Impaired sensation, Impaired tone, Impaired UE functional use, Impaired vision/preception, Postural dysfunction  Visit Diagnosis: Other abnormalities of gait and mobility  Other disturbances of skin sensation  Muscle weakness (generalized)     Problem List Patient Active Problem List   Diagnosis Date Noted  . Neuropathic pain of left shoulder 05/19/2016  . Alcohol use disorder,  severe, dependence (HCC) 02/15/2016  . Muscle contusion 02/11/2016  . Spastic hemiplegia affecting nondominant side (HCC)   . Muscle spasticity   . Poor appetite   . Hyperlipidemia 01/14/2016  . Depression 01/14/2016  . Obesity 01/14/2016  . Hyperglycemia 01/14/2016  . Urinary tract infection, site not specified 01/14/2016  . Basal ganglia hemorrhage (HCC) 01/14/2016  . Hemiparesis affecting nondominant side as late effect of cerebrovascular accident (HCC)   . Cognitive deficit, post-stroke   . Left hemiparesis (HCC)   . Essential hypertension   . Dysphagia, post-stroke   . Gait disturbance, post-stroke   . Hyponatremia   . Thrombocytopenia (HCC)   . Hemorrhagic stroke (HCC) R basal ganglia d/t HTN 01/08/2016    Lynett GrimesSarah Kiaraliz Rafuse, SPTA 02/06/2017, 1:33 PM  [facility] [address] [place], [place], [address] Phone: [phone]   Fax:  [phone]  Name: Jon MinersGeorge D  [name] MRN: [id number] Date of Birth: 01-10-1956  This note has been reviewed and edited by supervising CI.  Sallyanne Kuster, PTA, [facility] [address], [address] [place], [place] [address] [phone] 02/06/17, 3:11 PM

## 2017-02-13 ENCOUNTER — Ambulatory Visit (HOSPITAL_BASED_OUTPATIENT_CLINIC_OR_DEPARTMENT_OTHER): Payer: BLUE CROSS/BLUE SHIELD | Admitting: Physical Medicine & Rehabilitation

## 2017-02-13 ENCOUNTER — Encounter: Payer: Self-pay | Admitting: Physical Medicine & Rehabilitation

## 2017-02-13 ENCOUNTER — Encounter: Payer: BLUE CROSS/BLUE SHIELD | Attending: Physical Medicine & Rehabilitation

## 2017-02-13 VITALS — BP 106/68 | HR 55

## 2017-02-13 DIAGNOSIS — F1010 Alcohol abuse, uncomplicated: Secondary | ICD-10-CM | POA: Diagnosis not present

## 2017-02-13 DIAGNOSIS — I629 Nontraumatic intracranial hemorrhage, unspecified: Secondary | ICD-10-CM | POA: Insufficient documentation

## 2017-02-13 DIAGNOSIS — I10 Essential (primary) hypertension: Secondary | ICD-10-CM | POA: Insufficient documentation

## 2017-02-13 DIAGNOSIS — Z5189 Encounter for other specified aftercare: Secondary | ICD-10-CM | POA: Diagnosis not present

## 2017-02-13 DIAGNOSIS — R252 Cramp and spasm: Secondary | ICD-10-CM | POA: Diagnosis not present

## 2017-02-13 DIAGNOSIS — G8110 Spastic hemiplegia affecting unspecified side: Secondary | ICD-10-CM

## 2017-02-13 DIAGNOSIS — G8194 Hemiplegia, unspecified affecting left nondominant side: Secondary | ICD-10-CM | POA: Diagnosis not present

## 2017-02-13 DIAGNOSIS — I639 Cerebral infarction, unspecified: Secondary | ICD-10-CM | POA: Diagnosis not present

## 2017-02-13 NOTE — Patient Instructions (Signed)

## 2017-02-13 NOTE — Progress Notes (Signed)
Botox Injection for spasticity using needle EMG guidance  Dilution: 50 Units/ml Indication: Severe spasticity which interferes with ADL,mobility and/or  hygiene and is unresponsive to medication management and other conservative care Informed consent was obtained after describing risks and benefits of the procedure with the patient. This includes bleeding, bruising, infection, excessive weakness, or medication side effects. A REMS form is on file and signed. Needle: 27g 1" needle electrode Number of units per muscle  FCR25 FCU0 FDS25 FDP25 FPL25  All injections were done after obtaining appropriate EMG activity and after negative drawback for blood. The patient tolerated the procedure well. Post procedure instructions were given. A followup appointment was made.

## 2017-02-16 ENCOUNTER — Ambulatory Visit: Payer: BLUE CROSS/BLUE SHIELD | Admitting: Physical Therapy

## 2017-02-16 ENCOUNTER — Encounter: Payer: Self-pay | Admitting: Physical Therapy

## 2017-02-16 DIAGNOSIS — R2689 Other abnormalities of gait and mobility: Secondary | ICD-10-CM

## 2017-02-16 DIAGNOSIS — G8194 Hemiplegia, unspecified affecting left nondominant side: Secondary | ICD-10-CM

## 2017-02-16 DIAGNOSIS — M6281 Muscle weakness (generalized): Secondary | ICD-10-CM

## 2017-02-16 DIAGNOSIS — R208 Other disturbances of skin sensation: Secondary | ICD-10-CM | POA: Diagnosis not present

## 2017-02-16 NOTE — Therapy (Signed)
[facility] [address] [place], [place], [address] Phone: [phone]   Fax:  [phone]  Physical Therapy Treatment and Discharge Summary  Patient Details  Name: Jon [name] MRN: [id number] Date of Birth: April 12, 1956 Referring Provider: Alysia Penna  Encounter Date: 02/16/2017      PT End of Session - 02/16/17 2149    Visit Number 5   Number of Visits 5   Date for PT Re-Evaluation 02/18/17   Authorization Type UHC    Authorization - Visit Number 23   Authorization - Number of Visits 30   PT Start Time 1532   PT Stop Time 1620   PT Time Calculation (min) 48 min   Activity Tolerance Patient tolerated treatment well   Behavior During Therapy Flat affect      Past Medical History:  Diagnosis Date  . Hypertension   . Liver damage   . Stroke [facility])     Past Surgical History:  Procedure Laterality Date  . TONSILLECTOMY AND ADENOIDECTOMY Bilateral    Age 61    There were no vitals filed for this visit.      Subjective Assessment - 02/16/17 1533    Subjective Reports leg feels like a wooden leg. "You just have to compensate for it. "   Patient is accompained by: Family member   Pertinent History HTN, HLD, and alcohol abuse    Patient Stated Goals would like to get rid of this dizziness; "drunk"    Currently in Pain? No/denies                         [facility] Adult PT Treatment/Exercise - 02/16/17 0001      Transfers   Transfers Sit to Stand;Stand to Sit   Sit to Stand 6: Modified independent (Device/Increase time);With upper extremity assist   Five time sit to stand comments  31.84   Stand to Sit 6: Modified independent (Device/Increase time)     Ambulation/Gait   Ambulation/Gait Assistance 5: Supervision;4: Min assist   Ambulation/Gait Assistance Details with Lt AFO, vc for bringing lt pelvis forward out of retraction; min assist to position pelvis in neutral prior to advancing  LLE   Ambulation Distance (Feet) 120 Feet  120, 50   Assistive device Straight cane;Hemi-walker   Gait Pattern Step-through pattern;Decreased hip/knee flexion - left;Decreased stance time - left;Lateral trunk lean to right;Decreased weight shift to left;Decreased step length - right;Trunk rotated posteriorly on left;Decreased step length - left;Left hip hike;Left steppage;Poor foot clearance - left  LLE in External rotation to initiate swing with adductors   Ambulation Surface Level;Indoor   Pre-Gait Activities at counter; staggered stance RLE in front, shift forward and "roll" over Lt 1st toe with knee flexion (to prevent external rotation and use of adductors) pt able to carryover to ~50% of steps afterwards             Balance Exercises - 02/16/17 2153      Balance Exercises: Standing   SLS Eyes open;Upper extremity support 1  1-2 seconds (on LLE)           PT Education - 02/16/17 2147    Education provided Yes   Education Details remaining 7 visits his insurance will cover this year and recommendation to save them in case they are needed; need to continue walking program with cane (wife follows with w/c, when she can get pt out of the house to walk)   Person(s)  Educated Patient;Spouse   Methods Explanation   Comprehension Verbalized understanding          PT Short Term Goals - 01/20/17 0732            PT Long Term Goals - 02/16/17 1544      PT LONG TERM GOAL #1   Title Patient will perform HEP independently (or with wife's assistance if exercises require assist). (TARGET for all LTGs: 02/18/17)   Baseline 02/16/17 pt reports he does his UE stretches only; wife reports they still have handouts from 2017 HEP and do not need re-printed   Time 4   Period Weeks   Status Not Met     PT LONG TERM GOAL #2   Title Patient will ambulate 50 feet with supervision and LRAD and appropriate orthotic/shoe modifications to prevent lt toe/foot dragging to reduce fall risk.     Baseline 02/16/17 120 ft with Lt AFO and SPC with supervision   Time 4   Period Weeks   Status Achieved     PT LONG TERM GOAL #3   Title Patient will improve gait velocity to >=0.60 ft/sec with LRAD and supervision in working towards a functional gait velocity to reduce fall risk.   Baseline 02/16/17 0.46 ft/sec with SPC (as compared to eval 0.46 ft/sec with RW and no AFO)   Time 4   Period Weeks   Status Partially Met  did progress to [facility] and AFO, but not incr velocity     PT LONG TERM GOAL #4   Title Patient will perform 5 times sit to stand with use of RUE from chair with armrests in <16 seconds, indicating improved LE strength and lesser fall risk.    Baseline 02/16/17 31.84 ft/sec (with AFO; not used on eval)   Time 4   Period Weeks   Status Not Met               Plan - 02/16/17 2159    Clinical Impression Statement LTGs assessed with pt meeting 1 of 4 goals, partially meeting 1 of 4, and did not meet 2 of 4 goals. Timing goals not met possibly due to addition of AFO (for incr safety) and move from RW to [facility]. These were both improvements however likely caused decr speed of movement. Patient remains with flat affect and wife reports difficult to motivate him to do his HEP or walking program. Patient remains focused on lack of use/recovery of his LUE. Patient and wife agreeable to discharge from PT at this time in order to work on HEP/walking on their own and to save 7 visits for possible future need.    Rehab Potential Fair   Clinical Impairments Affecting Rehab Potential co-morbidities   PT Treatment/Interventions ADLs/Self Care Home Management;Biofeedback;Canalith Repostioning;Electrical Stimulation;Cognitive remediation;Neuromuscular re-education;Balance training;Therapeutic exercise;Therapeutic activities;Functional mobility training;Stair training;Gait training;DME Instruction;Orthotic Fit/Training;Patient/family education;Wheelchair mobility training;Manual  techniques;Vestibular   Consulted and Agree with Plan of Care Patient;Family member/caregiver   Family Member Consulted wife-[name]      Patient will benefit from skilled therapeutic intervention in order to improve the following deficits and impairments:  Abnormal gait, Decreased activity tolerance, Decreased balance, Decreased cognition, Decreased knowledge of precautions, Decreased knowledge of use of DME, Decreased mobility, Decreased safety awareness, Decreased strength, Difficulty walking, Dizziness, Impaired sensation, Impaired tone, Impaired UE functional use, Impaired vision/preception, Postural dysfunction  Visit Diagnosis: Other abnormalities of gait and mobility  Muscle weakness (generalized)  Left hemiparesis ([place])     Problem List Patient Active Problem List  Diagnosis Date Noted  . Neuropathic pain of left shoulder 05/19/2016  . Alcohol use disorder, severe, dependence ([place]) 02/15/2016  . Muscle contusion 02/11/2016  . Spastic hemiplegia affecting nondominant side ([place])   . Muscle spasticity   . Poor appetite   . Hyperlipidemia 01/14/2016  . Depression 01/14/2016  . Obesity 01/14/2016  . Hyperglycemia 01/14/2016  . Urinary tract infection, site not specified 01/14/2016  . Basal ganglia hemorrhage ([place]) 01/14/2016  . Hemiparesis affecting nondominant side as late effect of cerebrovascular accident ([place])   . Cognitive deficit, post-stroke   . Left hemiparesis ([place])   . Essential hypertension   . Dysphagia, post-stroke   . Gait disturbance, post-stroke   . Hyponatremia   . Thrombocytopenia ([place])   . Hemorrhagic stroke ([place]) R basal ganglia d/t HTN 01/08/2016   PHYSICAL THERAPY DISCHARGE SUMMARY  Visits from Start of Care: 5  Current functional level related to goals / functional outcomes: Supervision ambulating with SPC and Lt AFO   Remaining deficits: Lt hemiparesis, lt neglect   Education / Equipment: HEP; use of equipment pt already owned but had  not been using  Plan: Patient agrees to discharge.  Patient goals were not met. Patient is being discharged due to financial reasons.  ?????        Rexanne Mano, PT 02/16/2017, 10:06 PM  [facility] [address] [place], [place], [address] Phone: [phone]   Fax:  [phone]  Name: Jon [name] MRN: [id number] Date of Birth: [birth year]

## 2017-02-21 DIAGNOSIS — I69359 Hemiplegia and hemiparesis following cerebral infarction affecting unspecified side: Secondary | ICD-10-CM | POA: Diagnosis not present

## 2017-02-21 DIAGNOSIS — Z1159 Encounter for screening for other viral diseases: Secondary | ICD-10-CM | POA: Diagnosis not present

## 2017-02-21 DIAGNOSIS — I69391 Dysphagia following cerebral infarction: Secondary | ICD-10-CM | POA: Diagnosis not present

## 2017-02-21 DIAGNOSIS — Z0000 Encounter for general adult medical examination without abnormal findings: Secondary | ICD-10-CM | POA: Diagnosis not present

## 2017-02-21 DIAGNOSIS — Z125 Encounter for screening for malignant neoplasm of prostate: Secondary | ICD-10-CM | POA: Diagnosis not present

## 2017-02-21 DIAGNOSIS — E782 Mixed hyperlipidemia: Secondary | ICD-10-CM | POA: Diagnosis not present

## 2017-02-21 DIAGNOSIS — I10 Essential (primary) hypertension: Secondary | ICD-10-CM | POA: Diagnosis not present

## 2017-03-14 ENCOUNTER — Telehealth: Payer: Self-pay | Admitting: *Deleted

## 2017-03-14 NOTE — Telephone Encounter (Signed)
Jon [name] called and requests to speak with Jon [name].  Requesting a call back.

## 2017-03-15 NOTE — Telephone Encounter (Signed)
I spoke with Terrill Mohr, I spoke with Dr. Wynn Banker.  Patient's is wanting a referral to reinstate the remaining 7 visits they have available for outpatient therapies.  Dr. Wynn Banker states that it is his professional medical opinion that the patient has achieved maximum benefit from outpatient therapy. Miss Babin contradicts stating that the therapists at neurorehab have told her otherwise. She says that the patient was following a pattern of receiving botox, then going to therapy, botox, then going to therapy, etc...Marland KitchenMarland KitchenShe said they made the mistake off letting the referral lapse and does not understand why they cannot continue. FYI

## 2017-03-23 ENCOUNTER — Telehealth: Payer: Self-pay | Admitting: Physical Medicine & Rehabilitation

## 2017-03-23 NOTE — Telephone Encounter (Signed)
PTN WIFE DROPPED OFF NOTE FOR [place] - DELIVERED ORIGINAL TO [facility]

## 2017-04-09 ENCOUNTER — Encounter: Payer: Self-pay | Admitting: Physical Medicine & Rehabilitation

## 2017-04-11 ENCOUNTER — Encounter: Payer: Self-pay | Admitting: Neurology

## 2017-04-24 ENCOUNTER — Telehealth: Payer: Self-pay | Admitting: Neurology

## 2017-04-24 DIAGNOSIS — G8194 Hemiplegia, unspecified affecting left nondominant side: Secondary | ICD-10-CM

## 2017-04-24 NOTE — Telephone Encounter (Signed)
Patient's wife is calling to get an order for OT for his left arm.

## 2017-04-24 NOTE — Addendum Note (Signed)
Addended by: Marvel Plan on: 04/24/2017 04:23 PM   Modules accepted: Orders

## 2017-04-24 NOTE — Telephone Encounter (Signed)
Orders are in for neuro outpatient rehab for pt.They can call to schedule appts.

## 2017-04-24 NOTE — Telephone Encounter (Signed)
Will order outpt PT/OT for him. Thanks  Marvel Plan, MD PhD Stroke Neurology 04/24/2017 4:21 PM  Orders Placed This Encounter  Procedures  . Ambulatory referral to Physical Therapy    Referral Priority:   Routine    Referral Type:   Physical Medicine    Referral Reason:   Specialty Services Required    Requested Specialty:   Physical Therapy    Number of Visits Requested:   1  . Ambulatory referral to Occupational Therapy    Referral Priority:   Routine    Referral Type:   Occupational Therapy    Referral Reason:   Specialty Services Required    Requested Specialty:   Occupational Therapy    Number of Visits Requested:   1

## 2017-05-08 ENCOUNTER — Ambulatory Visit: Payer: BLUE CROSS/BLUE SHIELD | Attending: Family Medicine | Admitting: Occupational Therapy

## 2017-05-08 DIAGNOSIS — R2689 Other abnormalities of gait and mobility: Secondary | ICD-10-CM | POA: Diagnosis not present

## 2017-05-08 DIAGNOSIS — I69318 Other symptoms and signs involving cognitive functions following cerebral infarction: Secondary | ICD-10-CM | POA: Diagnosis not present

## 2017-05-08 DIAGNOSIS — M6281 Muscle weakness (generalized): Secondary | ICD-10-CM | POA: Diagnosis not present

## 2017-05-08 DIAGNOSIS — R414 Neurologic neglect syndrome: Secondary | ICD-10-CM | POA: Diagnosis not present

## 2017-05-08 DIAGNOSIS — R278 Other lack of coordination: Secondary | ICD-10-CM

## 2017-05-08 DIAGNOSIS — R208 Other disturbances of skin sensation: Secondary | ICD-10-CM

## 2017-05-08 DIAGNOSIS — R41842 Visuospatial deficit: Secondary | ICD-10-CM | POA: Diagnosis not present

## 2017-05-08 DIAGNOSIS — G8112 Spastic hemiplegia affecting left dominant side: Secondary | ICD-10-CM | POA: Diagnosis not present

## 2017-05-09 NOTE — Therapy (Addendum)
[facility] [address] [place], [place], [address] Phone: [phone]   Fax:  [phone]  Occupational Therapy Treatment  Patient Details  Name: Jon [name] MRN: [id number] Date of Birth: Mar 06, 1956 Referring Provider: Dr. Rosalin Hawking  Encounter Date: 05/08/2017      OT End of Session - 05/09/17 1952    Visit Number 1   Number of Visits 7   Date for OT Re-Evaluation 06/23/17   Authorization Type BCBS, 30 OT/PT combined, no auth req (has used 18 OT previously and 5 PT with 7 remaining), [employer] effective 08/25/16 per wife   Authorization - Visit Number 1   Authorization - Number of Visits 7   OT Start Time 8502   OT Stop Time 1450   OT Time Calculation (min) 45 min   Activity Tolerance Patient tolerated treatment well   Behavior During Therapy [facility] for tasks assessed/performed      Past Medical History:  Diagnosis Date  . Hypertension   . Liver damage   . Stroke [facility])     Past Surgical History:  Procedure Laterality Date  . TONSILLECTOMY AND ADENOIDECTOMY Bilateral    Age 61    There were no vitals filed for this visit.      Subjective Assessment - 05/08/17 1417    Subjective  Pt reports that he feels that his arm and leg are tighter.  Pt reports that he is doing exercises (but admits that it is not always exactly as instructed), pt not wearing splint consistently.   Pertinent History CVA 01/08/16 with L spastic hemiplegia s/p Botox 02/13/17; HTN, hyperlipidemia, depression, urinary problems, hx of alcohol use, cognitive deficits, confusion, L neglect/inattention   Limitations Pt reports he sometimes does not want to lie down as he is having urinary problems; fall risk, L neglect/inattention, cognitive deficits/confusion    Patient Stated Goals Get my left arm going again    Currently in Pain? No/denies            [facility] OT Assessment - 05/09/17 0001      Assessment   Diagnosis L hemiparesis    Referring Provider Dr. Rosalin Hawking     AROM   Overall AROM  Deficits   Overall AROM Comments now unable to initiate wrist ext, initates elbow ext AAROM in supine (unable in sitting), no shoulder flex, no finger ext     PROM   Overall PROM Comments only 25-50% composite wrist ext after prolonged stretch     LUE Tone   LUE Tone Modified Ashworth     LUE Tone   Modified Ashworth Scale for Grading Hypertonia LUE More marked increase in muscle tone through most of the ROM, but affected part(s) easily moved                  OT Treatments/Exercises (OP) - 05/09/17 0001      ADLs   ADL Comments Pt/wife question whether OT can recommend 4-wheeled RW as pt desires to have a seat.  Recommended against RW due to pt's inability to hold this type of walker with LUE and manipulate brake and due to decr stability for balance due to 4 wheels.  Recommended pt discuss with PT if he wishes to persue.       Splinting   Splinting checked current splint.  Splint continues to fit appropriate, but pt/caregiver needed review of proper donning/positioning as fingers were not positioned correctly when pt arrived due to incr spasticity.  Also  replaced strapping at thumb.  Stressed importance of splint wear due to incr spasticity.                OT Education - 05/09/17 1950    Education Details changes from when previously in therapy; importance in performing HEP as instructed every day and wearing splint to prevent future complications   Person(s) Educated Patient;Spouse   Methods Explanation   Comprehension Verbalized understanding          OT Short Term Goals - 05/09/17 2016      OT SHORT TERM GOAL #1   Title Pt will be independent with updated splint wear/care prn.--check STGs 01/05/17   Baseline ---   Time 4   Period Weeks   Status Achieved  12/20/16     OT SHORT TERM GOAL #2   Title Pt will attend to L side of body/space with no more than min v.c.   Baseline ---   Time 4    Period Weeks   Status Not Met  12/29/16:  min-mod cues, 01/16/17  min-mod cueing     OT SHORT TERM GOAL #4   Title Pt/caregiver will be independent with updated stretching HEP to assist in managing increased spasticity.   Time 4   Period Weeks   Status New   Target Date 05/29/17           OT Long Term Goals - 05/09/17 2018      OT LONG TERM GOAL #1   Title Pt will be independent with updated HEP.--check LTGs 01/05/17   Time 6   Period Weeks   Status Achieved  01/16/17     OT LONG TERM GOAL #2   Title Pt will demonstrate 20* shoulder flexion in prep for funtional reach.   Time 6   Period Weeks   Status Not Met  01/16/17:  pt able to perform AAROM      OT LONG TERM GOAL #3   Title Pt will demonstrate 25% elbow ext in prep for functional reach.   Time 6   Period Weeks   Status Achieved  01/02/17  met      OT LONG TERM GOAL #4   Title Pt will be able to perform simple wt. bearing through LUE mod I.   Time 6   Period Weeks   Status Partially Met  01/16/17:  inconsistent, min A/set up needed at times     OT LONG TERM GOAL #5   Title Pt/caregiver will be independent with updated AAROM HEP.--check LTGs 06/23/17   Time 7   Period Weeks   Status New   Target Date 06/23/17     OT LONG TERM GOAL #6   Title Pt will demo ability to perform at least 75% composite wrist/finger PROM with good positioning.   Baseline 25-50% after prolonged stretch   Time 7   Period Weeks   Status New     OT LONG TERM GOAL #7   Title Pt will demonstrate 25% elbow ext in prep for functional reach.   Baseline no longer able to perform   Time 7   Period Weeks   Status New               Plan - 05/09/17 1956    Clinical Impression Statement Pt returns to occupational therapy after being on hold since 01/16/17.  Pt has since seen PT and been discharged.  Pt returns with desire to use remaining therapy benefits for the year and  due to difficulty with performing current HEP at home.  Pt  continues to demo significant L inattention, perceptual/sensation deficits, and cognitive/attention deficits that are barriers.  However, pt would benefit from additional occupational therapy to update HEP as pt now demo incr spasticity which affects ability to perform current HEP correctly.   Rehab Potential Fair   Current Impairments/barriers affecting progress: sensation deficits, L inattention/neglect, cognitive deficits   OT Frequency 1x / week   OT Duration 7 weeks   OT Treatment/Interventions Self-care/ADL training;Therapeutic exercise;DME and/or AE instruction;Therapist, nutritional;Therapeutic activities;Patient/family education;Splinting;Neuromuscular education;Electrical Stimulation;Passive range of motion;Therapeutic exercises;Visual/perceptual remediation/compensation;Cognitive remediation/compensation;Manual Therapy;Parrafin;Cryotherapy;Ultrasound;Moist Heat;Fluidtherapy   Plan pt to bring in HEP handouts.  Will review/update HEP due to incr spasticity in LUE.   OT Home Exercise Plan Education provided:  Reviewed previous HEP (continues to be appropriate 12/13/16); updates to HEP 12/20/16   Consulted and Agree with Plan of Care Patient;Family member/caregiver   Family Member Consulted wife      Patient will benefit from skilled therapeutic intervention in order to improve the following deficits and impairments:  Abnormal gait, Decreased balance, Decreased endurance, Difficulty walking, Impaired sensation, Impaired vision/preception, Pain, Increased edema, Decreased range of motion, Decreased cognition, Decreased activity tolerance, Decreased coordination, Decreased knowledge of use of DME, Decreased safety awareness, Decreased strength, Impaired UE functional use, Decreased knowledge of precautions, Decreased mobility, Impaired perceived functional ability  Visit Diagnosis: Spastic hemiplegia affecting left dominant side, unspecified etiology (HCC)  Visuospatial deficit  Other  lack of coordination  Other disturbances of skin sensation  Other abnormalities of gait and mobility  Left-sided neglect  Other symptoms and signs involving cognitive functions following cerebral infarction    Problem List Patient Active Problem List   Diagnosis Date Noted  . Neuropathic pain of left shoulder 05/19/2016  . Alcohol use disorder, severe, dependence ([place]) 02/15/2016  . Muscle contusion 02/11/2016  . Spastic hemiplegia affecting nondominant side ([place])   . Muscle spasticity   . Poor appetite   . Hyperlipidemia 01/14/2016  . Depression 01/14/2016  . Obesity 01/14/2016  . Hyperglycemia 01/14/2016  . Urinary tract infection, site not specified 01/14/2016  . Basal ganglia hemorrhage ([place]) 01/14/2016  . Hemiparesis affecting nondominant side as late effect of cerebrovascular accident ([place])   . Cognitive deficit, post-stroke   . Left hemiparesis ([place])   . Essential hypertension   . Dysphagia, post-stroke   . Gait disturbance, post-stroke   . Hyponatremia   . Thrombocytopenia ([place])   . Hemorrhagic stroke ([place]) R basal ganglia d/t HTN 01/08/2016    [facility] 05/09/2017, 8:51 PM  [place] [address] [place], [place], [address] Phone: [phone]   Fax:  [phone]  Name: Jon [name] MRN: [id number] Date of Birth: [birth year]   Vianne Bulls, OTR/L [facility] [address] [place], [place]  [address] [phone] phone [phone] 05/09/17 8:51 PM

## 2017-05-16 ENCOUNTER — Ambulatory Visit: Payer: BLUE CROSS/BLUE SHIELD | Admitting: Occupational Therapy

## 2017-05-16 DIAGNOSIS — R278 Other lack of coordination: Secondary | ICD-10-CM

## 2017-05-16 DIAGNOSIS — R41842 Visuospatial deficit: Secondary | ICD-10-CM

## 2017-05-16 DIAGNOSIS — R414 Neurologic neglect syndrome: Secondary | ICD-10-CM | POA: Diagnosis not present

## 2017-05-16 DIAGNOSIS — R208 Other disturbances of skin sensation: Secondary | ICD-10-CM

## 2017-05-16 DIAGNOSIS — G8112 Spastic hemiplegia affecting left dominant side: Secondary | ICD-10-CM | POA: Diagnosis not present

## 2017-05-16 DIAGNOSIS — R2689 Other abnormalities of gait and mobility: Secondary | ICD-10-CM | POA: Diagnosis not present

## 2017-05-16 DIAGNOSIS — M6281 Muscle weakness (generalized): Secondary | ICD-10-CM | POA: Diagnosis not present

## 2017-05-16 DIAGNOSIS — I69318 Other symptoms and signs involving cognitive functions following cerebral infarction: Secondary | ICD-10-CM | POA: Diagnosis not present

## 2017-05-16 NOTE — Therapy (Addendum)
[facility] [address] [place], [place], [address] Phone: [phone]   Fax:  [phone]  Occupational Therapy Treatment  Patient Details  Name: Jon [name] MRN: [id number] Date of Birth: 09/28/1955 Referring Provider: Dr. Rosalin Hawking  Encounter Date: 05/16/2017      OT End of Session - 05/16/17 1941    Visit Number 2   Number of Visits 7   Date for OT Re-Evaluation 06/23/17   Authorization Type BCBS, 30 OT/PT combined, no auth req (has used 18 OT previously and 5 PT with 7 remaining), [employer] effective 08/25/16 per wife   Authorization - Visit Number 2   Authorization - Number of Visits 7   OT Start Time 3419   OT Stop Time 1628   OT Time Calculation (min) 50 min   Activity Tolerance Patient tolerated treatment well   Behavior During Therapy [facility] for tasks assessed/performed      Past Medical History:  Diagnosis Date  . Hypertension   . Liver damage   . Stroke [facility])     Past Surgical History:  Procedure Laterality Date  . TONSILLECTOMY AND ADENOIDECTOMY Bilateral    Age 61    There were no vitals filed for this visit.      Subjective Assessment - 05/16/17 1940    Subjective  Pt reports that he has been wearing splint at home and Botox has been scheduled for 05/23/17   Pertinent History CVA 01/08/16 with L spastic hemiplegia s/p Botox 02/13/17; HTN, hyperlipidemia, depression, urinary problems, hx of alcohol use, cognitive deficits, confusion, L neglect/inattention   Limitations Pt reports he sometimes does not want to lie down as he is having urinary problems; fall risk, L neglect/inattention, cognitive deficits/confusion    Patient Stated Goals Get my left arm going again    Currently in Pain? No/denies      Began reviewing previous HEPs including removing duplicates and then using current handouts for reminders.  Started with self PROM exercises (wrist ext, finger ext, supination, shoulder flex in  supine with hands clasped) then reviewed scapular retraction in sitting, table slides for AAROM (shoulder flex, abduction, circumduction), and AAROM elbow ext/low-range shoulder flex with hemi-walker/quad cane.  Recommended that pt perform more self PROM vs. Caregiver assisted to incr attention to L side/LUE.  Pt needed min-mod cueing for proper technique/positioning and to slow down for controlled, isolated movement and to hold stretches.  Discussed plan to go through/review remaining HEP next session to see if any wt. Bearing or additional AAROM ex are still appropriate.  Pt reports concerns about floor transfer in case of fall.  Unable to fully address due to time constraints, but recommended pt receive assist if available, have cell phone with him if alone.  Also discussed getting to knees if possible and getting to chair/sofa so that he can use it to help push-up.  May practice at later date if time allows.                           OT Short Term Goals - 05/09/17 2016      OT SHORT TERM GOAL #1   Title Pt will be independent with updated splint wear/care prn.--check STGs 01/05/17   Baseline ---   Time 4   Period Weeks   Status Achieved  12/20/16     OT SHORT TERM GOAL #2   Title Pt will attend to L side of body/space with no  more than min v.c.   Baseline ---   Time 4   Period Weeks   Status Not Met  12/29/16:  min-mod cues, 01/16/17  min-mod cueing     OT SHORT TERM GOAL #4   Title Pt/caregiver will be independent with updated stretching HEP to assist in managing increased spasticity.   Time 4   Period Weeks   Status New   Target Date 05/29/17           OT Long Term Goals - 05/09/17 2018      OT LONG TERM GOAL #1   Title Pt will be independent with updated HEP.--check LTGs 01/05/17   Time 6   Period Weeks   Status Achieved  01/16/17     OT LONG TERM GOAL #2   Title Pt will demonstrate 20* shoulder flexion in prep for funtional reach.   Time 6    Period Weeks   Status Not Met  01/16/17:  pt able to perform AAROM      OT LONG TERM GOAL #3   Title Pt will demonstrate 25% elbow ext in prep for functional reach.   Time 6   Period Weeks   Status Achieved  01/02/17  met      OT LONG TERM GOAL #4   Title Pt will be able to perform simple wt. bearing through LUE mod I.   Time 6   Period Weeks   Status Partially Met  01/16/17:  inconsistent, min A/set up needed at times     OT LONG TERM GOAL #5   Title Pt/caregiver will be independent with updated AAROM HEP.--check LTGs 06/23/17   Time 7   Period Weeks   Status New   Target Date 06/23/17     OT LONG TERM GOAL #6   Title Pt will demo ability to perform at least 75% composite wrist/finger PROM with good positioning.   Baseline 25-50% after prolonged stretch   Time 7   Period Weeks   Status New     OT LONG TERM GOAL #7   Title Pt will demonstrate 25% elbow ext in prep for functional reach.   Baseline no longer able to perform   Time 7   Period Weeks   Status New               Plan - 05/16/17 1942    Clinical Impression Statement Began reviewing HEP from previous OT.  Pt needed min-mod cueing for proper positioning/technique, and to go slow and hold stretches.   Rehab Potential Fair   Current Impairments/barriers affecting progress: sensation deficits, L inattention/neglect, cognitive deficits   OT Frequency 1x / week   OT Duration 7 weeks     OT Treatment/Interventions Self-care/ADL training;Therapeutic exercise;DME and/or AE instruction;Therapist, nutritional;Therapeutic activities;Patient/family education;Splinting;Neuromuscular education;Electrical Stimulation;Passive range of motion;Therapeutic exercises;Visual/perceptual remediation/compensation;Cognitive remediation/compensation;Manual Therapy;Parrafin;Cryotherapy;Ultrasound;Moist Heat;Fluidtherapy   Plan continue to review/update HEP   OT Home Exercise Plan Education provided:  Reviewed previous HEP  (continues to be appropriate 12/13/16); updates to HEP 12/20/16   Consulted and Agree with Plan of Care Patient;Family member/caregiver   Family Member Consulted wife      Patient will benefit from skilled therapeutic intervention in order to improve the following deficits and impairments:  Abnormal gait, Decreased balance, Decreased endurance, Difficulty walking, Impaired sensation, Impaired vision/preception, Pain, Increased edema, Decreased range of motion, Decreased cognition, Decreased activity tolerance, Decreased coordination, Decreased knowledge of use of DME, Decreased safety awareness, Decreased strength, Impaired UE functional use, Decreased knowledge  of precautions, Decreased mobility, Impaired perceived functional ability  Visit Diagnosis: Spastic hemiplegia affecting left dominant side, unspecified etiology ([place])  Visuospatial deficit  Other lack of coordination  Other disturbances of skin sensation  Other abnormalities of gait and mobility  Left-sided neglect  Other symptoms and signs involving cognitive functions following cerebral infarction  Muscle weakness (generalized)    Problem List Patient Active Problem List   Diagnosis Date Noted  . Neuropathic pain of left shoulder 05/19/2016  . Alcohol use disorder, severe, dependence ([place]) 02/15/2016  . Muscle contusion 02/11/2016  . Spastic hemiplegia affecting nondominant side ([place])   . Muscle spasticity   . Poor appetite   . Hyperlipidemia 01/14/2016  . Depression 01/14/2016  . Obesity 01/14/2016  . Hyperglycemia 01/14/2016  . Urinary tract infection, site not specified 01/14/2016  . Basal ganglia hemorrhage ([place]) 01/14/2016  . Hemiparesis affecting nondominant side as late effect of cerebrovascular accident ([place])   . Cognitive deficit, post-stroke   . Left hemiparesis ([place])   . Essential hypertension   . Dysphagia, post-stroke   . Gait disturbance, post-stroke   . Hyponatremia   . Thrombocytopenia ([place])    . Hemorrhagic stroke ([place]) R basal ganglia d/t HTN 01/08/2016    [facility] 05/16/2017, 7:47 PM  [place] [address] [place], [place], [address] Phone: [phone]   Fax:  [phone]  Name: ZOE NORDIN MRN: [id number] Date of Birth: [birth year]   Vianne Bulls, OTR/L [facility] [address] [place], [place]  [address] [phone] phone [phone] 05/16/17 7:47 PM

## 2017-05-22 ENCOUNTER — Ambulatory Visit: Payer: BLUE CROSS/BLUE SHIELD | Admitting: Occupational Therapy

## 2017-05-22 DIAGNOSIS — R41842 Visuospatial deficit: Secondary | ICD-10-CM

## 2017-05-22 DIAGNOSIS — M6281 Muscle weakness (generalized): Secondary | ICD-10-CM | POA: Diagnosis not present

## 2017-05-22 DIAGNOSIS — I69318 Other symptoms and signs involving cognitive functions following cerebral infarction: Secondary | ICD-10-CM

## 2017-05-22 DIAGNOSIS — R278 Other lack of coordination: Secondary | ICD-10-CM | POA: Diagnosis not present

## 2017-05-22 DIAGNOSIS — G8112 Spastic hemiplegia affecting left dominant side: Secondary | ICD-10-CM

## 2017-05-22 DIAGNOSIS — R208 Other disturbances of skin sensation: Secondary | ICD-10-CM

## 2017-05-22 DIAGNOSIS — R2689 Other abnormalities of gait and mobility: Secondary | ICD-10-CM

## 2017-05-22 DIAGNOSIS — R414 Neurologic neglect syndrome: Secondary | ICD-10-CM

## 2017-05-22 NOTE — Therapy (Addendum)
[facility] [address] [place], [place], [address] Phone: [phone]   Fax:  [phone]  Occupational Therapy Treatment  Patient Details  Name: Jon [name] MRN: [id number] Date of Birth: Dec 15, 1955 Referring Provider: Dr. Marvel Plan  Encounter Date: 05/22/2017      OT End of Session - 05/22/17 1943    Visit Number 3   Number of Visits 7   Date for OT Re-Evaluation 06/23/17   Authorization Type [employer], 30 OT/PT combined, no auth req (has used 18 OT previously and 5 PT with 7 remaining), [employer] effective 08/25/16 per wife   Authorization - Visit Number 3   Authorization - Number of Visits 7   OT Start Time 1533   OT Stop Time 1619   OT Time Calculation (min) 46 min   Activity Tolerance Patient tolerated treatment well   Behavior During Therapy [facility] for tasks assessed/performed      Past Medical History:  Diagnosis Date  . Hypertension   . Liver damage   . Stroke [facility])     Past Surgical History:  Procedure Laterality Date  . TONSILLECTOMY AND ADENOIDECTOMY Bilateral    Age 61    There were no vitals filed for this visit.      Subjective Assessment - 05/22/17 1941    Subjective  Botox has been scheduled tomorrow.  sometimes my hand is loose, but it's tight other times   Pertinent History CVA 01/08/16 with L spastic hemiplegia s/p Botox 02/13/17; HTN, hyperlipidemia, depression, urinary problems, hx of alcohol use, cognitive deficits, confusion, L neglect/inattention   Limitations Pt reports he sometimes does not want to lie down as he is having urinary problems; fall risk, L neglect/inattention, cognitive deficits/confusion    Patient Stated Goals Get my left arm going again    Currently in Pain? No/denies           Began reviewing remaining exercises from previous HEPs including removing duplicates and then using current handouts for reminders.    Reviewed supine self PROM shoulder flex, AAROM/AROM  elbow flex/ext (active relaxation) in supine, and partial roll to L side in supine for wt. Bearing.  In sitting, with forearm supported, held bottle in L hand while opening with RUE.  In sitting, Attempted bilateral low-range functional reach to grasp light object with BUEs and bring to lap with mod cueing/mod facilitation for proper positioning of LUE due to incr spasticity.  Pt needed min-mod cueing overall for proper technique/positioning and to slow down for controlled, isolated movement for these exercises.  Attempted AAROM wrist ext--discharged due to pt unable with incr spasticity.  Also discharged wt. Bearing through L hand as pt unable to incr wrist/finger spasticity.  Recommended pt perform self PROM exercises (wrist ext, finger ext, supination, shoulder flex in supine with hands clasped), scapular retraction in sitting, table slides for AAROM (shoulder flex, abduction, circumduction), and AAROM elbow ext/low-range shoulder flex with hemi-walker/quad cane every day.  Recommended pt perform other exercises every other day.  Reviewed recommendation that pt perform more self PROM vs. Caregiver assisted to incr attention to L side/LUE.                      OT Education - 05/22/17 1948    Education Details Importance of continuing to perform HEP and wear splint to decr risk of contractures/future complications; ongoing Botox may be needed to help manage spasticity per MD; reviewed proper positioning of LUE in bed to prevent  future complications   Person(s) Educated Patient;Spouse   Methods Explanation;Demonstration   Comprehension Verbalized understanding          OT Short Term Goals - 05/22/17 2016      OT SHORT TERM GOAL #1   Title ----------------   Baseline ---     OT SHORT TERM GOAL #2   Baseline ---   Time 4     OT SHORT TERM GOAL #4   Title Pt/caregiver will be independent with updated stretching HEP to assist in managing increased spasticity.   Time 4    Period Weeks   Status New           OT Long Term Goals - 05/22/17 2017      OT LONG TERM GOAL #1   Title ----     OT LONG TERM GOAL #2   Title ----   Status --     OT LONG TERM GOAL #3   Title -----     OT LONG TERM GOAL #4   Title ---     OT LONG TERM GOAL #5   Title Pt/caregiver will be independent with updated AAROM HEP.--check LTGs 06/23/17   Time 7   Period Weeks   Status New     OT LONG TERM GOAL #6   Title Pt will demo ability to perform at least 75% composite wrist/finger PROM with good positioning.   Baseline 25-50% after prolonged stretch   Time 7   Period Weeks   Status New     OT LONG TERM GOAL #7   Title Pt will demonstrate 25% elbow ext in prep for functional reach.   Baseline no longer able to perform   Time 7   Period Weeks   Status New               Plan - 05/22/17 1944    Clinical Impression Statement Completed review of previous HEP.  Pt unable to perform wt. bearing through L hand in sitting/standing now and AAROM wrist ext (discontinued/discharged for now) due to incr hand/wrist spasticity.   Rehab Potential Fair   Current Impairments/barriers affecting progress: sensation deficits, L inattention/neglect, cognitive deficits   OT Frequency 1x / week   OT Duration --  7 weeks   OT Treatment/Interventions Self-care/ADL training;Therapeutic exercise;DME and/or AE instruction;Building services engineer;Therapeutic activities;Patient/family education;Splinting;Neuromuscular education;Electrical Stimulation;Passive range of motion;Therapeutic exercises;Visual/perceptual remediation/compensation;Cognitive remediation/compensation;Manual Therapy;Parrafin;Cryotherapy;Ultrasound;Moist Heat;Fluidtherapy   Plan reinforce HEP, AAROM as able s/p Botox (scheduled 10/30)   OT Home Exercise Plan Education provided:  Reviewed previous HEP (continues to be appropriate 12/13/16); updates to HEP 12/20/16   Consulted and Agree with Plan of Care  Patient;Family member/caregiver   Family Member Consulted wife      Patient will benefit from skilled therapeutic intervention in order to improve the following deficits and impairments:  Abnormal gait, Decreased balance, Decreased endurance, Difficulty walking, Impaired sensation, Impaired vision/preception, Pain, Increased edema, Decreased range of motion, Decreased cognition, Decreased activity tolerance, Decreased coordination, Decreased knowledge of use of DME, Decreased safety awareness, Decreased strength, Impaired UE functional use, Decreased knowledge of precautions, Decreased mobility, Impaired perceived functional ability  Visit Diagnosis: Spastic hemiplegia affecting left dominant side, unspecified etiology (HCC)  Visuospatial deficit  Other lack of coordination  Other disturbances of skin sensation  Other abnormalities of gait and mobility  Left-sided neglect  Other symptoms and signs involving cognitive functions following cerebral infarction    Problem List Patient Active Problem List   Diagnosis Date Noted  .  Neuropathic pain of left shoulder 05/19/2016  . Alcohol use disorder, severe, dependence (HCC) 02/15/2016  . Muscle contusion 02/11/2016  . Spastic hemiplegia affecting nondominant side (HCC)   . Muscle spasticity   . Poor appetite   . Hyperlipidemia 01/14/2016  . Depression 01/14/2016  . Obesity 01/14/2016  . Hyperglycemia 01/14/2016  . Urinary tract infection, site not specified 01/14/2016  . Basal ganglia hemorrhage (HCC) 01/14/2016  . Hemiparesis affecting nondominant side as late effect of cerebrovascular accident (HCC)   . Cognitive deficit, post-stroke   . Left hemiparesis (HCC)   . Essential hypertension   . Dysphagia, post-stroke   . Gait disturbance, post-stroke   . Hyponatremia   . Thrombocytopenia (HCC)   . Hemorrhagic stroke (HCC) R basal ganglia d/t HTN 01/08/2016    [facility] Center[facility]REEMAN,Tyson Masin 05/22/2017, 8:20 PM  [facility] [address] [place], [place], [address] Phone: [phone]   Fax:  [phone]  Name: Tawanna CoolerGeorge D Hellickson MRN: [id number] Date of Birth: 28-May-1956   Willa FraterAngela Cylan Borum, OTR/L [facility] [address] [place], [place]  [address] [phone] phone [phone] 05/22/17 8:20 PM

## 2017-05-22 NOTE — Addendum Note (Signed)
Addended by: Willa FraterFREEMAN, Lemon Whitacre D on: 05/22/2017 08:28 PM   Modules accepted: Orders

## 2017-05-23 ENCOUNTER — Ambulatory Visit (HOSPITAL_BASED_OUTPATIENT_CLINIC_OR_DEPARTMENT_OTHER): Payer: BLUE CROSS/BLUE SHIELD | Admitting: Physical Medicine & Rehabilitation

## 2017-05-23 ENCOUNTER — Encounter: Payer: BLUE CROSS/BLUE SHIELD | Attending: Physical Medicine & Rehabilitation

## 2017-05-23 ENCOUNTER — Encounter: Payer: Self-pay | Admitting: Physical Medicine & Rehabilitation

## 2017-05-23 VITALS — BP 112/68 | HR 61

## 2017-05-23 DIAGNOSIS — G8110 Spastic hemiplegia affecting unspecified side: Secondary | ICD-10-CM | POA: Diagnosis not present

## 2017-05-23 DIAGNOSIS — I10 Essential (primary) hypertension: Secondary | ICD-10-CM | POA: Insufficient documentation

## 2017-05-23 DIAGNOSIS — R252 Cramp and spasm: Secondary | ICD-10-CM | POA: Diagnosis not present

## 2017-05-23 DIAGNOSIS — F1010 Alcohol abuse, uncomplicated: Secondary | ICD-10-CM | POA: Diagnosis not present

## 2017-05-23 DIAGNOSIS — I629 Nontraumatic intracranial hemorrhage, unspecified: Secondary | ICD-10-CM | POA: Diagnosis not present

## 2017-05-23 DIAGNOSIS — G8194 Hemiplegia, unspecified affecting left nondominant side: Secondary | ICD-10-CM | POA: Insufficient documentation

## 2017-05-23 DIAGNOSIS — I639 Cerebral infarction, unspecified: Secondary | ICD-10-CM | POA: Diagnosis not present

## 2017-05-23 DIAGNOSIS — Z5189 Encounter for other specified aftercare: Secondary | ICD-10-CM | POA: Insufficient documentation

## 2017-05-23 NOTE — Patient Instructions (Signed)

## 2017-05-23 NOTE — Progress Notes (Signed)
Botox Injection for spasticity using needle EMG guidance  Dilution: 50 Units/ml Indication: Severe spasticity which interferes with ADL,mobility and/or  hygiene and is unresponsive to medication management and other conservative care Informed consent was obtained after describing risks and benefits of the procedure with the patient. This includes bleeding, bruising, infection, excessive weakness, or medication side effects. A REMS form is on file and signed. Needle: 27g 1" needle electrode Number of units per muscle  FCR25 FCU0 FDS25 FDP25 FPL25  All injections were done after obtaining appropriate EMG activity and after negative drawback for blood. The patient tolerated the procedure well. Post procedure instructions were given. A followup appointment was made.  

## 2017-05-29 ENCOUNTER — Ambulatory Visit: Payer: BLUE CROSS/BLUE SHIELD | Attending: Family Medicine | Admitting: Occupational Therapy

## 2017-05-29 ENCOUNTER — Encounter: Payer: Self-pay | Admitting: Occupational Therapy

## 2017-05-29 DIAGNOSIS — I69318 Other symptoms and signs involving cognitive functions following cerebral infarction: Secondary | ICD-10-CM | POA: Insufficient documentation

## 2017-05-29 DIAGNOSIS — I69319 Unspecified symptoms and signs involving cognitive functions following cerebral infarction: Secondary | ICD-10-CM | POA: Insufficient documentation

## 2017-05-29 DIAGNOSIS — R41842 Visuospatial deficit: Secondary | ICD-10-CM | POA: Diagnosis not present

## 2017-05-29 DIAGNOSIS — R2689 Other abnormalities of gait and mobility: Secondary | ICD-10-CM | POA: Diagnosis not present

## 2017-05-29 DIAGNOSIS — R208 Other disturbances of skin sensation: Secondary | ICD-10-CM

## 2017-05-29 DIAGNOSIS — G8112 Spastic hemiplegia affecting left dominant side: Secondary | ICD-10-CM | POA: Insufficient documentation

## 2017-05-29 DIAGNOSIS — R414 Neurologic neglect syndrome: Secondary | ICD-10-CM | POA: Insufficient documentation

## 2017-05-29 DIAGNOSIS — G8194 Hemiplegia, unspecified affecting left nondominant side: Secondary | ICD-10-CM | POA: Insufficient documentation

## 2017-05-29 DIAGNOSIS — R278 Other lack of coordination: Secondary | ICD-10-CM

## 2017-05-29 NOTE — Therapy (Signed)
[facility] [place] [address] [place], [place], [address] Phone: [phone]   Fax:  [phone]  Occupational Therapy Treatment  Patient Details  Name: Jon [name] MRN: [id number] Date of Birth: 1955/10/17 Referring Provider: Dr. Marvel Plan   Encounter Date: 05/29/2017  OT End of Session - 05/29/17 1630    Visit Number  4    Number of Visits  7    Date for OT Re-Evaluation  06/23/17    Authorization Type  [employer], 30 OT/PT combined, no auth req (has used 18 OT previously and 5 PT with 7 remaining), [employer] effective 08/25/16 per wife    Authorization - Visit Number  4    Authorization - Number of Visits  7    OT Start Time  1534    OT Stop Time  1617    OT Time Calculation (min)  43 min    Activity Tolerance  Patient tolerated treatment well    Behavior During Therapy  [facility] for tasks assessed/performed       Past Medical History:  Diagnosis Date  . Hypertension   . Liver damage   . Stroke [facility])     Past Surgical History:  Procedure Laterality Date  . TONSILLECTOMY AND ADENOIDECTOMY Bilateral    Age 61    There were no vitals filed for this visit.  Subjective Assessment - 05/29/17 1628    Subjective   I can tell a difference since the Botox    Pertinent History  CVA 01/08/16 with L spastic hemiplegia s/p Botox 02/13/17; HTN, hyperlipidemia, depression, urinary problems, hx of alcohol use, cognitive deficits, confusion, L neglect/inattention    Limitations  Pt reports he sometimes does not want to lie down as he is having urinary problems; fall risk, L neglect/inattention, cognitive deficits/confusion     Patient Stated Goals  Get my left arm going again     Currently in Pain?  No/denies           Continued to review HEPs including using current handouts for reminders.    Reviewed supine self PROM shoulder flex, AAROM/AROM elbow flex/ext (active relaxation) in supine, and partial roll to L side in supine for wt.  Bearing.  In sitting, Attempted bilateral low-range functional reach to grasp light object with BUEs and bring to lap with mod cueing/mod facilitation for proper positioning of LUE due to incr spasticity and for midline alignment.  Pt needed min-mod cueing overall for proper technique/positioning and to slow down for controlled, isolated movement for these exercises.  Light wt. Bearing through L hand with body on arm movements with mod facilitation.  Reviewed recommendation that pt perform self PROM exercises (wrist ext, finger ext, supination, shoulder flex in supine with hands clasped), scapular retraction in sitting, table slides for AAROM (shoulder flex, abduction, circumduction), and AAROM elbow ext/low-range shoulder flex with hemi-walker/quad cane every day.  Recommended pt perform other exercises every other day.                      OT Short Term Goals - 05/29/17 1633      OT SHORT TERM GOAL #1   Title  ----------------    Baseline  ---      OT SHORT TERM GOAL #2   Baseline  ---    Time  4      OT SHORT TERM GOAL #4   Title  Pt/caregiver will be independent with updated stretching HEP to assist in managing  increased spasticity.    Time  4    Period  Weeks    Status  On-going needs min-mod cueing at times for proper positioning   needs min-mod cueing at times for proper positioning       OT Long Term Goals - 05/22/17 2017      OT LONG TERM GOAL #1   Title  ----      OT LONG TERM GOAL #2   Title  ----    Status  --      OT LONG TERM GOAL #3   Title  -----      OT LONG TERM GOAL #4   Title  ---      OT LONG TERM GOAL #5   Title  Pt/caregiver will be independent with updated AAROM HEP.--check LTGs 06/23/17    Time  7    Period  Weeks    Status  New      OT LONG TERM GOAL #6   Title  Pt will demo ability to perform at least 75% composite wrist/finger PROM with good positioning.    Baseline  25-50% after prolonged stretch    Time  7    Period   Weeks    Status  New      OT LONG TERM GOAL #7   Title  Pt will demonstrate 25% elbow ext in prep for functional reach.    Baseline  no longer able to perform    Time  7    Period  Weeks    Status  New            Plan - 05/29/17 1631    Clinical Impression Statement  Pt now able to perform light wt. bearing with min-mod facilitaton/cues this week s/p Botox last week.  Pt able to demo 50-75% composite wrist/finger ext today after stretching.    Rehab Potential  Fair    Current Impairments/barriers affecting progress:  sensation deficits, L inattention/neglect, cognitive deficits    OT Frequency  1x / week    OT Duration  -- 7 weeks   7 weeks   OT Treatment/Interventions  Self-care/ADL training;Therapeutic exercise;DME and/or AE instruction;Building services engineer;Therapeutic activities;Patient/family education;Splinting;Neuromuscular education;Electrical Stimulation;Passive range of motion;Therapeutic exercises;Visual/perceptual remediation/compensation;Cognitive remediation/compensation;Manual Therapy;Parrafin;Cryotherapy;Ultrasound;Moist Heat;Fluidtherapy    Plan  continue to reinforce HEP, AAROM as able, wt. bearing as able     OT Home Exercise Plan  Education provided:  Reviewed previous HEP (continues to be appropriate 12/13/16); updates to HEP 12/20/16    Consulted and Agree with Plan of Care  Patient;Family member/caregiver    Family Member Consulted  wife       Patient will benefit from skilled therapeutic intervention in order to improve the following deficits and impairments:  Abnormal gait, Decreased balance, Decreased endurance, Difficulty walking, Impaired sensation, Impaired vision/preception, Pain, Increased edema, Decreased range of motion, Decreased cognition, Decreased activity tolerance, Decreased coordination, Decreased knowledge of use of DME, Decreased safety awareness, Decreased strength, Impaired UE functional use, Decreased knowledge of precautions,  Decreased mobility, Impaired perceived functional ability  Visit Diagnosis: Spastic hemiplegia affecting left dominant side, unspecified etiology (HCC)  Visuospatial deficit  Other lack of coordination  Other disturbances of skin sensation  Other abnormalities of gait and mobility  Left-sided neglect  Other symptoms and signs involving cognitive functions following cerebral infarction  Left hemiparesis [facility])    Problem List Patient Active Problem List   Diagnosis Date Noted  . Neuropathic pain of left shoulder 05/19/2016  . Alcohol  use disorder, severe, dependence (HCC) 02/15/2016  . Muscle contusion 02/11/2016  . Spastic hemiplegia affecting nondominant side (HCC)   . Muscle spasticity   . Poor appetite   . Hyperlipidemia 01/14/2016  . Depression 01/14/2016  . Obesity 01/14/2016  . Hyperglycemia 01/14/2016  . Urinary tract infection, site not specified 01/14/2016  . Basal ganglia hemorrhage (HCC) 01/14/2016  . Hemiparesis affecting nondominant side as late effect of cerebrovascular accident (HCC)   . Cognitive deficit, post-stroke   . Left hemiparesis (HCC)   . Essential hypertension   . Dysphagia, post-stroke   . Gait disturbance, post-stroke   . Hyponatremia   . Thrombocytopenia (HCC)   . Hemorrhagic stroke (HCC) R basal ganglia d/t HTN 01/08/2016    [facility] HospitalFREEMAN,Robie Oats 05/29/2017, 4:54 PM  [facility] [address] [place], [place], [address] Phone: [phone]   Fax:  [phone]  Name: Jon [name] MRN: [id number] Date of Birth: [birth year]   Willa FraterAngela Ariq Khamis, OTR/L [facility] [address] [place], [place]  [address] [phone] phone [phone] 05/29/17 4:54 PM

## 2017-06-06 ENCOUNTER — Ambulatory Visit: Payer: BLUE CROSS/BLUE SHIELD | Admitting: Occupational Therapy

## 2017-06-06 ENCOUNTER — Encounter: Payer: Self-pay | Admitting: Occupational Therapy

## 2017-06-06 DIAGNOSIS — R2689 Other abnormalities of gait and mobility: Secondary | ICD-10-CM

## 2017-06-06 DIAGNOSIS — G8194 Hemiplegia, unspecified affecting left nondominant side: Secondary | ICD-10-CM | POA: Diagnosis not present

## 2017-06-06 DIAGNOSIS — I69318 Other symptoms and signs involving cognitive functions following cerebral infarction: Secondary | ICD-10-CM | POA: Diagnosis not present

## 2017-06-06 DIAGNOSIS — R41842 Visuospatial deficit: Secondary | ICD-10-CM

## 2017-06-06 DIAGNOSIS — G8112 Spastic hemiplegia affecting left dominant side: Secondary | ICD-10-CM | POA: Diagnosis not present

## 2017-06-06 DIAGNOSIS — R278 Other lack of coordination: Secondary | ICD-10-CM

## 2017-06-06 DIAGNOSIS — R208 Other disturbances of skin sensation: Secondary | ICD-10-CM

## 2017-06-06 DIAGNOSIS — R414 Neurologic neglect syndrome: Secondary | ICD-10-CM

## 2017-06-06 DIAGNOSIS — I69319 Unspecified symptoms and signs involving cognitive functions following cerebral infarction: Secondary | ICD-10-CM | POA: Diagnosis not present

## 2017-06-06 NOTE — Therapy (Signed)
[facility] [address] [place], [place], [address] Phone: [phone]   Fax:  [phone]  Occupational Therapy Treatment  Patient Details  Name: Jon [name] MRN: [id number] Date of Birth: 1956/01/17 Referring Provider: Dr. Marvel Plan   Encounter Date: 06/06/2017  OT End of Session - 06/06/17 1648    Visit Number  5    Number of Visits  7    Date for OT Re-Evaluation  06/23/17    Authorization Type  [employer], 30 OT/PT combined, no auth req (has used 18 OT previously and 5 PT with 7 remaining), [employer]BS effective 08/25/16 per wife    Authorization - Visit Number  5    Authorization - Number of Visits  7    OT Start Time  1448    OT Stop Time  1529    OT Time Calculation (min)  41 min    Activity Tolerance  Patient tolerated treatment well       Past Medical History:  Diagnosis Date  . Hypertension   . Liver damage   . Stroke [facility])     Past Surgical History:  Procedure Laterality Date  . TONSILLECTOMY AND ADENOIDECTOMY Bilateral    Age 61    There were no vitals filed for this visit.  Subjective Assessment - 06/06/17 1452    Subjective   We brought the paper that Marylene Land has been working from.    Patient is accompained by:  Family member wife    Pertinent History  CVA 01/08/16 with L spastic hemiplegia s/p Botox 02/13/17; HTN, hyperlipidemia, depression, urinary problems, hx of alcohol use, cognitive deficits, confusion, L neglect/inattention    Limitations  Pt reports he sometimes does not want to lie down as he is having urinary problems; fall risk, L neglect/inattention, cognitive deficits/confusion     Patient Stated Goals  Get my left arm going again     Currently in Pain?  No/denies           Tx:  Reviewed HEP sheets with both pt and wife - pt and wife continue to need min-moderate cueing for HEP.  Pt requires cues due to impulsivity as well as needing cues for correct positioning with HEP.  Wife reports  that pt will "do the ones I am supposed to do with him here but he doesn't like to do them at home (PROM)."  Reinforced with pt and wife importance of PROM as well as the other active activities that pt has been given. Both verbalized understanding.   Neuro re ed in sitting using moveable surface to address low level forward reach in closed chain activity - pt needs moderate facilitation and max cues to attempt to move arm rather than lean forward with body.                  OT Education - 06/06/17 1645    Education provided  Yes    Education Details  reinforcement and review of HEP - pt/wife continue to need min -mod vc's for proper positioning for program     Person(s) Educated  Patient;Spouse    Methods  Explanation;Demonstration;Verbal cues    Comprehension  Verbalized understanding;Returned demonstration;Need further instruction       OT Short Term Goals - 06/06/17 1646      OT SHORT TERM GOAL #1   Title  ----------------    Baseline  ---      OT SHORT TERM GOAL #2   Baseline  ---  Time  4      OT SHORT TERM GOAL #4   Title  Pt/caregiver will be independent with updated stretching HEP to assist in managing increased spasticity.    Time  4    Period  Weeks    Status  On-going needs min-mod cueing at times for proper positioning        OT Long Term Goals - 06/06/17 1646      OT LONG TERM GOAL #1   Title  ----      OT LONG TERM GOAL #2   Title  ----      OT LONG TERM GOAL #3   Title  -----      OT LONG TERM GOAL #4   Title  ---      OT LONG TERM GOAL #5   Title  Pt/caregiver will be independent with updated AAROM HEP.--check LTGs 06/23/17    Time  7    Period  Weeks    Status  New      OT LONG TERM GOAL #6   Title  Pt will demo ability to perform at least 75% composite wrist/finger PROM with good positioning.    Baseline  25-50% after prolonged stretch    Time  7    Period  Weeks    Status  New      OT LONG TERM GOAL #7   Title  Pt will  demonstrate 25% elbow ext in prep for functional reach.    Baseline  no longer able to perform    Time  7    Period  Weeks    Status  New            Plan - 06/06/17 1646    Clinical Impression Statement  Pt with slow progress toward goals. Pt and wife continue to need min-mod vc's for HEP    Rehab Potential  Fair    Current Impairments/barriers affecting progress:  sensation deficits, L inattention/neglect, cognitive deficits    OT Frequency  1x / week    OT Duration  Other (comment) 7 weeks    OT Treatment/Interventions  Self-care/ADL training;Therapeutic exercise;DME and/or AE instruction;Building services engineerunctional Mobility Training;Therapeutic activities;Patient/family education;Splinting;Neuromuscular education;Electrical Stimulation;Passive range of motion;Therapeutic exercises;Visual/perceptual remediation/compensation;Cognitive remediation/compensation;Manual Therapy;Parrafin;Cryotherapy;Ultrasound;Moist Heat;Fluidtherapy    Plan  continue to reinforce HEP, AAROM as able, wt bearing as able    Consulted and Agree with Plan of Care  Patient;Family member/caregiver    Family Member Consulted  wife       Patient will benefit from skilled therapeutic intervention in order to improve the following deficits and impairments:  Abnormal gait, Decreased balance, Decreased endurance, Difficulty walking, Impaired sensation, Impaired vision/preception, Pain, Increased edema, Decreased range of motion, Decreased cognition, Decreased activity tolerance, Decreased coordination, Decreased knowledge of use of DME, Decreased safety awareness, Decreased strength, Impaired UE functional use, Decreased knowledge of precautions, Decreased mobility, Impaired perceived functional ability  Visit Diagnosis: Spastic hemiplegia affecting left dominant side, unspecified etiology (HCC)  Visuospatial deficit  Other lack of coordination  Other disturbances of skin sensation  Other abnormalities of gait and  mobility  Left-sided neglect  Other symptoms and signs involving cognitive functions following cerebral infarction    Problem List Patient Active Problem List   Diagnosis Date Noted  . Neuropathic pain of left shoulder 05/19/2016  . Alcohol use disorder, severe, dependence (HCC) 02/15/2016  . Muscle contusion 02/11/2016  . Spastic hemiplegia affecting nondominant side (HCC)   . Muscle spasticity   . Poor appetite   .  Hyperlipidemia 01/14/2016  . Depression 01/14/2016  . Obesity 01/14/2016  . Hyperglycemia 01/14/2016  . Urinary tract infection, site not specified 01/14/2016  . Basal ganglia hemorrhage (HCC) 01/14/2016  . Hemiparesis affecting nondominant side as late effect of cerebrovascular accident (HCC)   . Cognitive deficit, post-stroke   . Left hemiparesis (HCC)   . Essential hypertension   . Dysphagia, post-stroke   . Gait disturbance, post-stroke   . Hyponatremia   . Thrombocytopenia (HCC)   . Hemorrhagic stroke (HCC) R basal ganglia d/t HTN 01/08/2016    Norton Pastelulaski, Karen Halliday, OTR/L 06/06/2017, 4:49 PM  [facility] [address] [place], [place], [address] Phone: [phone]   Fax:  [phone]  Name: Jon [name] MRN: [id number] Date of Birth: [birth year]

## 2017-06-12 ENCOUNTER — Ambulatory Visit: Payer: BLUE CROSS/BLUE SHIELD | Admitting: Occupational Therapy

## 2017-06-12 DIAGNOSIS — R414 Neurologic neglect syndrome: Secondary | ICD-10-CM

## 2017-06-12 DIAGNOSIS — G8112 Spastic hemiplegia affecting left dominant side: Secondary | ICD-10-CM

## 2017-06-12 DIAGNOSIS — R278 Other lack of coordination: Secondary | ICD-10-CM | POA: Diagnosis not present

## 2017-06-12 DIAGNOSIS — G8194 Hemiplegia, unspecified affecting left nondominant side: Secondary | ICD-10-CM | POA: Diagnosis not present

## 2017-06-12 DIAGNOSIS — I69318 Other symptoms and signs involving cognitive functions following cerebral infarction: Secondary | ICD-10-CM | POA: Diagnosis not present

## 2017-06-12 DIAGNOSIS — R208 Other disturbances of skin sensation: Secondary | ICD-10-CM | POA: Diagnosis not present

## 2017-06-12 DIAGNOSIS — R2689 Other abnormalities of gait and mobility: Secondary | ICD-10-CM

## 2017-06-12 DIAGNOSIS — R41842 Visuospatial deficit: Secondary | ICD-10-CM | POA: Diagnosis not present

## 2017-06-12 DIAGNOSIS — I69319 Unspecified symptoms and signs involving cognitive functions following cerebral infarction: Secondary | ICD-10-CM

## 2017-06-12 NOTE — Therapy (Signed)
[facility] [address] [place], [place], [address] Phone: [phone]   Fax:  [phone]  Occupational Therapy Treatment  Patient Details  Name: Jon [name] MRN: [id number] Date of Birth: 1956/01/20 Referring Provider: Dr. Marvel Plan   Encounter Date: 06/12/2017  OT End of Session - 06/12/17 1735    Visit Number  6    Number of Visits  7    Date for OT Re-Evaluation  06/23/17    Authorization Type  [employer], 30 OT/PT combined, no auth req (has used 18 OT previously and 5 PT with 7 remaining), [employer]BS effective 08/25/16 per wife    Authorization - Visit Number  6    Authorization - Number of Visits  7    OT Start Time  1451    OT Stop Time  1535    OT Time Calculation (min)  44 min    Activity Tolerance  Patient tolerated treatment well       Past Medical History:  Diagnosis Date  . Hypertension   . Liver damage   . Stroke [facility])     Past Surgical History:  Procedure Laterality Date  . TONSILLECTOMY AND ADENOIDECTOMY Bilateral    Age 61    There were no vitals filed for this visit.  Subjective Assessment - 06/12/17 1732    Subjective   Pt reports that he has trouble doing wt. bearing at home    Patient is accompained by:  Family member wife    Pertinent History  CVA 01/08/16 with L spastic hemiplegia s/p Botox 02/13/17; HTN, hyperlipidemia, depression, urinary problems, hx of alcohol use, cognitive deficits, confusion, L neglect/inattention    Limitations  Pt reports he sometimes does not want to lie down as he is having urinary problems; fall risk, L neglect/inattention, cognitive deficits/confusion     Patient Stated Goals  Get my left arm going again     Currently in Pain?  No/denies        In sitting, AAROM elbow extension/shoulder flex with tilted stool and tilted cane with min-mod cueing/facilitation.  Reviewed that pt should slow down and work on small range with controlled movements.  Light wt.  Bearing through L hand with body on arm movements with mod facilitation/cues. (pt to slow down, improves with repetition).  Reviewed appropriate locations to perform at home.  Pt reports standing in front of mirror to perform lateral wt. Shifts.  Educated pt/wife that this may be beneficial, but that pt should make sure that his foot is properly positioned (tends to turn foot out) to prevent injury.  Pt also reports that he practices elbow flexion in front of mirror.  Recommended pt practice low range shoulder flex in small range instead to decr synergy patter/spasticity.  Pt/wife verbalized understanding.  Educated pt/wife in indications for return to therapy after d/c. Pt/wife verbalized understanding.  Wife with questions about a type of functional e-stim device for hand.  OT not familiar with this particular device, but discussed that they typically are most beneficial when pt has more proximal movement/control.    Due to earlier pt question, trial of tabletop UBE with L hand wrapped, however, even with attempts at proper positioning, recommended against this due to jerking of L shoulder and risk of injury due to decr LUE AAROM.  Pt/wife verbalized understanding.                   OT Short Term Goals - 06/06/17 1646  OT SHORT TERM GOAL #1   Title  ----------------    Baseline  ---      OT SHORT TERM GOAL #2   Baseline  ---    Time  4      OT SHORT TERM GOAL #4   Title  Pt/caregiver will be independent with updated stretching HEP to assist in managing increased spasticity.    Time  4    Period  Weeks    Status  On-going needs min-mod cueing at times for proper positioning        OT Long Term Goals - 06/12/17 1738      OT LONG TERM GOAL #1   Title  ----      OT LONG TERM GOAL #2   Title  ----      OT LONG TERM GOAL #3   Title  -----      OT LONG TERM GOAL #4   Title  ---      OT LONG TERM GOAL #5   Title  Pt/caregiver will be independent with updated  AAROM HEP.--check LTGs 06/23/17    Time  7    Period  Weeks    Status  New      OT LONG TERM GOAL #6   Title  Pt will demo ability to perform at least 75% composite wrist/finger PROM with good positioning.    Baseline  25-50% after prolonged stretch    Time  7    Period  Weeks    Status  Achieved 06/12/17:  s/p Botox and stretching      OT LONG TERM GOAL #7   Title  Pt will demonstrate 25% elbow ext in prep for functional reach.    Baseline  no longer able to perform    Time  7    Period  Weeks    Status  New            Plan - 06/12/17 1736    Clinical Impression Statement  Pt demo decr spasticity in L hand/wrist s/p Botox, but pt continues to be inconsistent with performance of HEP.    Rehab Potential  Fair    Current Impairments/barriers affecting progress:  sensation deficits, L inattention/neglect, cognitive deficits    OT Frequency  1x / week    OT Duration  Other (comment) 7 weeks    OT Treatment/Interventions  Self-care/ADL training;Therapeutic exercise;DME and/or AE instruction;Building services engineerunctional Mobility Training;Therapeutic activities;Splinting;Neuromuscular education;Electrical Stimulation;Passive range of motion;Therapeutic exercises;Visual/perceptual remediation/compensation;Cognitive remediation/compensation;Manual Therapy;Parrafin;Cryotherapy;Ultrasound;Moist Heat;Fluidtherapy    Plan  check remaining goals and d/c OT    Consulted and Agree with Plan of Care  Patient;Family member/caregiver    Family Member Consulted  wife       Patient will benefit from skilled therapeutic intervention in order to improve the following deficits and impairments:  Abnormal gait, Decreased balance, Decreased endurance, Difficulty walking, Impaired sensation, Impaired vision/preception, Pain, Increased edema, Decreased range of motion, Decreased cognition, Decreased activity tolerance, Decreased coordination, Decreased knowledge of use of DME, Decreased safety awareness, Decreased strength,  Impaired UE functional use, Decreased knowledge of precautions, Decreased mobility, Impaired perceived functional ability  Visit Diagnosis: Spastic hemiplegia affecting left dominant side, unspecified etiology (HCC)  Visuospatial deficit  Other lack of coordination  Other disturbances of skin sensation  Other abnormalities of gait and mobility  Left-sided neglect  Other symptoms and signs involving cognitive functions following cerebral infarction  Cognitive deficit status post cerebrovascular accident    Problem List Patient Active Problem List  Diagnosis Date Noted  . Neuropathic pain of left shoulder 05/19/2016  . Alcohol use disorder, severe, dependence (HCC) 02/15/2016  . Muscle contusion 02/11/2016  . Spastic hemiplegia affecting nondominant side (HCC)   . Muscle spasticity   . Poor appetite   . Hyperlipidemia 01/14/2016  . Depression 01/14/2016  . Obesity 01/14/2016  . Hyperglycemia 01/14/2016  . Urinary tract infection, site not specified 01/14/2016  . Basal ganglia hemorrhage (HCC) 01/14/2016  . Hemiparesis affecting nondominant side as late effect of cerebrovascular accident (HCC)   . Cognitive deficit, post-stroke   . Left hemiparesis (HCC)   . Essential hypertension   . Dysphagia, post-stroke   . Gait disturbance, post-stroke   . Hyponatremia   . Thrombocytopenia (HCC)   . Hemorrhagic stroke (HCC) R basal ganglia d/t HTN 01/08/2016    Sagewest LanderFREEMAN,Bryar Rennie 06/12/2017, 5:39 PM  [facility] [address] [place], [place], [address] Phone: [phone]   Fax:  [phone]  Name: Jon [name] MRN: [id number] Date of Birth: 1956/01/27   Willa FraterAngela Raynette Arras, OTR/L [facility] [address] [place], [place]  [address] [phone] phone [phone] 06/12/17 5:39 PM

## 2017-06-19 ENCOUNTER — Ambulatory Visit: Payer: BLUE CROSS/BLUE SHIELD | Admitting: Occupational Therapy

## 2017-06-19 ENCOUNTER — Encounter: Payer: Self-pay | Admitting: Occupational Therapy

## 2017-06-19 DIAGNOSIS — R2689 Other abnormalities of gait and mobility: Secondary | ICD-10-CM

## 2017-06-19 DIAGNOSIS — G8112 Spastic hemiplegia affecting left dominant side: Secondary | ICD-10-CM | POA: Diagnosis not present

## 2017-06-19 DIAGNOSIS — R414 Neurologic neglect syndrome: Secondary | ICD-10-CM | POA: Diagnosis not present

## 2017-06-19 DIAGNOSIS — I69318 Other symptoms and signs involving cognitive functions following cerebral infarction: Secondary | ICD-10-CM | POA: Diagnosis not present

## 2017-06-19 DIAGNOSIS — G8194 Hemiplegia, unspecified affecting left nondominant side: Secondary | ICD-10-CM | POA: Diagnosis not present

## 2017-06-19 DIAGNOSIS — R208 Other disturbances of skin sensation: Secondary | ICD-10-CM | POA: Diagnosis not present

## 2017-06-19 DIAGNOSIS — R278 Other lack of coordination: Secondary | ICD-10-CM

## 2017-06-19 DIAGNOSIS — R41842 Visuospatial deficit: Secondary | ICD-10-CM

## 2017-06-19 DIAGNOSIS — I69319 Unspecified symptoms and signs involving cognitive functions following cerebral infarction: Secondary | ICD-10-CM | POA: Diagnosis not present

## 2017-06-19 NOTE — Therapy (Signed)
[facility] [address] [place], [place], [address] Phone: [phone]   Fax:  [phone]  Occupational Therapy Treatment  Patient Details  Name: Jon [name] MRN: [id number] Date of Birth: [birth year] Referring Provider: Dr. Rosalin Hawking   Encounter Date: 06/19/2017  OT End of Session - 06/19/17 1457    Visit Number  7    Number of Visits  7    Date for OT Re-Evaluation  06/23/17    Authorization Type  [employer], 30 OT/PT combined, no auth req (has used 18 OT previously and 5 PT with 7 remaining), [employer] effective 08/25/16 per wife    Authorization - Visit Number  7    Authorization - Number of Visits  7    OT Start Time  1450    OT Stop Time  1530    OT Time Calculation (min)  40 min    Activity Tolerance  Patient tolerated treatment well    Behavior During Therapy  [facility] for tasks assessed/performed       Past Medical History:  Diagnosis Date  . Hypertension   . Liver damage   . Stroke [facility])     Past Surgical History:  Procedure Laterality Date  . TONSILLECTOMY AND ADENOIDECTOMY Bilateral    Age 18    There were no vitals filed for this visit.  Subjective Assessment - 06/19/17 1740    Subjective   pt reports that he has not been doing exercises regularly at home, but has been wearing splint    Patient is accompained by:  Family member wife    Pertinent History  CVA 01/08/16 with L spastic hemiplegia s/p Botox 02/13/17; HTN, hyperlipidemia, depression, urinary problems, hx of alcohol use, cognitive deficits, confusion, L neglect/inattention    Limitations  Pt reports he sometimes does not want to lie down as he is having urinary problems; fall risk, L neglect/inattention, cognitive deficits/confusion     Patient Stated Goals  Get my left arm going again     Currently in Pain?  No/denies        Reviewed all exercises from updated HEP and pt returned demo with min cueing (wife verbalized understanding).   Emphasized/reviewed importance of performing consistently for improved LUE control/use and to prevent future complications (incr spasticity, contractures).  Also emphasized attention to L side during functional tasks and performing HEP as instructed to prevent injury.  Recommended against 4-wheeled RW due to decr balance and inability to hold walker with L hand.    Ambulating within session with min cueing for L foot alignment.  Educated pt/wife in indications for return to therapy after d/c. Pt/wife verbalized understanding.       OT Short Term Goals - 06/19/17 1741      OT SHORT TERM GOAL #1   Title  ----------------    Baseline  ---      OT SHORT TERM GOAL #2   Baseline  ---    Time  4      OT SHORT TERM GOAL #4   Title  Pt/caregiver will be independent with updated stretching HEP to assist in managing increased spasticity.    Time  4    Period  Weeks    Status  Achieved needs min-mod cueing at times for proper positioning.  06/19/17  Met, but pt reports inconsistently performing         OT Long Term Goals - 06/19/17 1742      OT LONG TERM GOAL #1  Title  ----      OT LONG TERM GOAL #2   Title  ----      OT LONG TERM GOAL #3   Title  -----      OT LONG TERM GOAL #4   Title  ---      OT LONG TERM GOAL #5   Title  Pt/caregiver will be independent with updated AAROM HEP.--check LTGs 06/23/17    Time  7    Period  Weeks    Status  Achieved 06/19/17:  however, pt inconsistently performing      OT LONG TERM GOAL #6   Title  Pt will demo ability to perform at least 75% composite wrist/finger PROM with good positioning.    Baseline  25-50% after prolonged stretch    Time  7    Period  Weeks    Status  Achieved 06/12/17:  s/p Botox and stretching (approx this level)      OT LONG TERM GOAL #7   Title  Pt will demonstrate 25% elbow ext in prep for functional reach.    Baseline  no longer able to perform    Time  7    Period  Weeks    Status  Not Met 06/19/17:   inconsistent AAROM            Plan - 06/19/17 1741    Clinical Impression Statement  Pt continues to report/demo decr carryover with recommendations for home with decr awareness and cognitive deficits.    Rehab Potential  Fair    Current Impairments/barriers affecting progress:  sensation deficits, L inattention/neglect, cognitive deficits    OT Frequency  1x / week    OT Duration  Other (comment) 7 weeks    OT Treatment/Interventions  Self-care/ADL training;Therapeutic exercise;DME and/or AE instruction;Therapist, nutritional;Therapeutic activities;Splinting;Neuromuscular education;Electrical Stimulation;Passive range of motion;Therapeutic exercises;Visual/perceptual remediation/compensation;Cognitive remediation/compensation;Manual Therapy;Parrafin;Cryotherapy;Ultrasound;Moist Heat;Fluidtherapy    Plan  d/c OT    Consulted and Agree with Plan of Care  Patient;Family member/caregiver    Family Member Consulted  wife       Patient will benefit from skilled therapeutic intervention in order to improve the following deficits and impairments:  Abnormal gait, Decreased balance, Decreased endurance, Difficulty walking, Impaired sensation, Impaired vision/preception, Pain, Increased edema, Decreased range of motion, Decreased cognition, Decreased activity tolerance, Decreased coordination, Decreased knowledge of use of DME, Decreased safety awareness, Decreased strength, Impaired UE functional use, Decreased knowledge of precautions, Decreased mobility, Impaired perceived functional ability  Visit Diagnosis: Left hemiparesis (HCC)  Other lack of coordination  Visuospatial deficit  Other disturbances of skin sensation  Other abnormalities of gait and mobility  Left-sided neglect  Other symptoms and signs involving cognitive functions following cerebral infarction    Problem List Patient Active Problem List   Diagnosis Date Noted  . Neuropathic pain of left shoulder  05/19/2016  . Alcohol use disorder, severe, dependence ([place]) 02/15/2016  . Muscle contusion 02/11/2016  . Spastic hemiplegia affecting nondominant side ([place])   . Muscle spasticity   . Poor appetite   . Hyperlipidemia 01/14/2016  . Depression 01/14/2016  . Obesity 01/14/2016  . Hyperglycemia 01/14/2016  . Urinary tract infection, site not specified 01/14/2016  . Basal ganglia hemorrhage ([place]) 01/14/2016  . Hemiparesis affecting nondominant side as late effect of cerebrovascular accident ([place])   . Cognitive deficit, post-stroke   . Left hemiparesis ([place])   . Essential hypertension   . Dysphagia, post-stroke   . Gait disturbance, post-stroke   . Hyponatremia   .  Thrombocytopenia ([place])   . Hemorrhagic stroke ([place]) R basal ganglia d/t HTN 01/08/2016   OCCUPATIONAL THERAPY DISCHARGE SUMMARY  Visits from Start of Care: 7 (since latest renewal)  Current functional level related to goals / functional outcomes: See above   Remaining deficits: L hemiplegia/hemiparesis with decr LUE functional use, spasticity, decr LUE PROM/AROM and functional use, L inattention, decr sensation, and cognitive deficits   Education / Equipment: Pt educated in splint wear/care (reviewed from previous OT sessions), updated HEP, ways to prevent future complications/injury.  Pt/wife verbalized understanding of all education provided.  Plan: Patient agrees to discharge.  Patient goals were partially met. Patient is being discharged due to                                                    reaching maximal rehab potential at this time. ?????        [facility] 06/19/2017, 5:44 PM  [facility] [address] [place], [place], [address] Phone: [phone]   Fax:  [phone]  Name: DAMIN SALIDO MRN: [id number] Date of Birth: February 04, 1956   Vianne Bulls, OTR/L [facility] [address] [place], [place]   [address] [phone] phone [phone] 06/19/17 5:44 PM

## 2017-06-26 ENCOUNTER — Ambulatory Visit: Payer: BLUE CROSS/BLUE SHIELD | Admitting: Physical Medicine & Rehabilitation

## 2017-06-26 ENCOUNTER — Encounter: Payer: BLUE CROSS/BLUE SHIELD | Attending: Physical Medicine & Rehabilitation

## 2017-06-26 ENCOUNTER — Encounter: Payer: Self-pay | Admitting: Physical Medicine & Rehabilitation

## 2017-06-26 VITALS — BP 115/63 | HR 67

## 2017-06-26 DIAGNOSIS — R269 Unspecified abnormalities of gait and mobility: Secondary | ICD-10-CM

## 2017-06-26 DIAGNOSIS — I619 Nontraumatic intracerebral hemorrhage, unspecified: Secondary | ICD-10-CM

## 2017-06-26 DIAGNOSIS — Z5189 Encounter for other specified aftercare: Secondary | ICD-10-CM | POA: Diagnosis not present

## 2017-06-26 DIAGNOSIS — R252 Cramp and spasm: Secondary | ICD-10-CM | POA: Insufficient documentation

## 2017-06-26 DIAGNOSIS — F1010 Alcohol abuse, uncomplicated: Secondary | ICD-10-CM | POA: Insufficient documentation

## 2017-06-26 DIAGNOSIS — I69319 Unspecified symptoms and signs involving cognitive functions following cerebral infarction: Secondary | ICD-10-CM

## 2017-06-26 DIAGNOSIS — I629 Nontraumatic intracranial hemorrhage, unspecified: Secondary | ICD-10-CM | POA: Diagnosis not present

## 2017-06-26 DIAGNOSIS — I10 Essential (primary) hypertension: Secondary | ICD-10-CM | POA: Insufficient documentation

## 2017-06-26 DIAGNOSIS — G8194 Hemiplegia, unspecified affecting left nondominant side: Secondary | ICD-10-CM | POA: Insufficient documentation

## 2017-06-26 DIAGNOSIS — I69398 Other sequelae of cerebral infarction: Secondary | ICD-10-CM | POA: Diagnosis not present

## 2017-06-26 DIAGNOSIS — I639 Cerebral infarction, unspecified: Secondary | ICD-10-CM | POA: Diagnosis not present

## 2017-06-26 NOTE — Progress Notes (Signed)
Subjective:    Patient ID: Jon [name], male    DOB: 03/02/1956, 61 y.o.   MRN: [id number]  HPI 61-year-old male with history of right basal ganglia hemorrhage due to uncontrolled hypertension.  Prior history of alcohol abuse.  He continues with outpatient OT for left upper extremity neuromuscular reeducation.  Still has evidence of left neglect during functional tasks. He has had good results with botulinum toxin injection Patient wish to speak to me with his wife outside the room.  He had questions about his stroke, we also discussed depression as well as cognitive deficits after stroke.  He has completed his speech therapy. Pain Inventory Average Pain 0 Pain Right Now 0 My pain is intermittent  In the last 24 hours, has pain interfered with the following? General activity 0 Relation with others 0 Enjoyment of life 0 What TIME of day is your pain at its worst? . Sleep (in general) Good  Pain is worse with: . Pain improves with: . Relief from Meds: .  Mobility walk with assistance use a cane ability to climb steps?  no do you drive?  no  Function disabled: date disabled .  Neuro/Psych loss of taste or smell  Prior Studies Any changes since last visit?  no  Physicians involved in your care Any changes since last visit?  no   No family history on file. Social History   Socioeconomic History  . Marital status: Married    Spouse name: None  . Number of children: None  . Years of education: None  . Highest education level: None  Social Needs  . Financial resource strain: None  . Food insecurity - worry: None  . Food insecurity - inability: None  . Transportation needs - medical: None  . Transportation needs - non-medical: None  Occupational History  . None  Tobacco Use  . Smoking status: Former Smoker    Packs/day: 0.50    Years: 19.00    Pack years: 9.50    Types: Cigarettes    Last attempt to quit: 02/22/2000    Years since quitting: 17.3  .  Smokeless tobacco: Never Used  Substance and Sexual Activity  . Alcohol use: No    Alcohol/week: 7.2 oz    Types: 12 Glasses of wine per week    Comment: Wife reports chronic alcohol use. 01/12/16  . Drug use: No    Comment: teens to early 20's  . Sexual activity: None  Other Topics Concern  . None  Social History Narrative  . None   Past Surgical History:  Procedure Laterality Date  . TONSILLECTOMY AND ADENOIDECTOMY Bilateral    Age 73   Past Medical History:  Diagnosis Date  . Hypertension   . Liver damage   . Stroke [facility](HCC)    There were no vitals taken for this visit.  Opioid Risk Score:   Fall Risk Score:  `1  Depression screen PHQ 2/9  Depression screen [facility] 05/19/2016 04/21/2016  Decreased Interest 0 0  Down, Depressed, Hopeless 0 0  PHQ - 2 Score 0 0     Review of Systems  Constitutional: Negative.   HENT: Negative.   Eyes: Negative.   Respiratory: Negative.   Cardiovascular: Negative.   Gastrointestinal: Negative.   Endocrine: Negative.   Genitourinary: Negative.   Musculoskeletal: Negative.   Skin: Negative.   Allergic/Immunologic: Negative.   Neurological: Negative.   Hematological: Negative.   Psychiatric/Behavioral: Negative.   All other systems reviewed and are negative.  Objective:   Physical Exam  Constitutional: He appears well-developed and well-nourished. No distress.  HENT:  Head: Normocephalic and atraumatic.  Eyes: Conjunctivae and EOM are normal. Pupils are equal, round, and reactive to light.  Neurological:  Motor strength is 2- at the left bicep, left shoulder abduction is mainly with shoulder shrug Trace finger flexion 0 finger extension NAS 2 and left thumb flexor MAS 3 in left wrist flexor  Skin: He is not diaphoretic.  Psychiatric: His affect is blunt. His speech is not delayed and not slurred.  Nursing note and vitals reviewed.   MAS 3 in finger flexors      Assessment & Plan:  #1.  History of left spastic  hemiplegia secondary to right basal ganglia hemorrhage.  Overall he has made good recovery however still has residual left upper greater than lower extremity weakness as well as left neglect.  He also has cognitive deficits and adjustment issues Will refer to Neuropsych Repeat botox early Feb 200 U FDS50 FDP 50 FCR 50 FCU 25 FPL 25

## 2017-07-03 ENCOUNTER — Ambulatory Visit: Payer: BLUE CROSS/BLUE SHIELD | Admitting: Neurology

## 2017-07-03 ENCOUNTER — Other Ambulatory Visit: Payer: Self-pay | Admitting: Physical Medicine & Rehabilitation

## 2017-07-06 ENCOUNTER — Encounter: Payer: Self-pay | Admitting: Neurology

## 2017-07-06 ENCOUNTER — Ambulatory Visit: Payer: BLUE CROSS/BLUE SHIELD | Admitting: Neurology

## 2017-07-06 VITALS — BP 99/58 | HR 61 | Wt 147.4 lb

## 2017-07-06 DIAGNOSIS — I69359 Hemiplegia and hemiparesis following cerebral infarction affecting unspecified side: Secondary | ICD-10-CM | POA: Diagnosis not present

## 2017-07-06 DIAGNOSIS — E785 Hyperlipidemia, unspecified: Secondary | ICD-10-CM

## 2017-07-06 DIAGNOSIS — I610 Nontraumatic intracerebral hemorrhage in hemisphere, subcortical: Secondary | ICD-10-CM

## 2017-07-06 DIAGNOSIS — I10 Essential (primary) hypertension: Secondary | ICD-10-CM

## 2017-07-06 NOTE — Progress Notes (Signed)
STROKE NEUROLOGY FOLLOW UP NOTE  NAME: Jon CoolerGeorge D [name] DOB: April 20, 1956  REASON FOR VISIT: stroke follow up HISTORY FROM: pt and chart  Today we had the pleasure of seeing Jon [name] in follow-up at our Neurology Clinic. Pt was accompanied by wife.   History Summary Jon [name] is a 61 y.o. male with history of HTN, HLD, and alcohol abuse admitted no 01/08/16 for severe headache, left-sided weakness, slurred speech, and elevated blood pressure. CT showed a R basal ganglia hemorrhage likely due to HTN. CTA head and neck showed no AVM or aneurysm. EF 60-65%. LDL 157 and A1C 5.5. Resumed home metoprolol but added amlodipine for BP control. Also continued home pravastatin for HLD. Added zoloft for depression. He was discharged to CIR after stabilization but still has left hemiplegia.   04/11/16 follow up - the patient has been doing well. Released from CIR and now at home undergoing outpt PT/OT/speech. Still has left sided weakness, but left leg able to raise up but left arm still not able to move. BP at home 115/70, today in clinic 95/62. Reported occular migraine with rainbow colored imaging at right eye lasting 15min. No associated HA. It happened 2 times for the last 3 months.   07/07/06 follow up - pt has been doing well. He has lost 50lbs since the stroke, and no he has no more snore or apnea during sleep as per wife. However, he stated that when he sleeps at night, about half wake and half sleep, he sometimes see spider at the ceiling when fully awake, there is no spider. He still has left sided hemiparesis, follows with Dr. Wynn BankerKirsteins considering botox injection. BP 101/60 today, at home his BP also low, will decrease metoprolol dose.   01/16/17 follow up - pt has been following with Dr. Wynn BankerKirsteins and got botox injection at left side. Still has distal weakness including hand and foot. Continued to have outpt PT/OT 1-2 times per week, only has 13 sessions left for this year. Pt complains  of dizziness, especially with sitting up or standing up. His BP today still low at 95/58. Pt stated that his BP at home 110/70. His BP was low last two visits, and metoprolol decreased from 100mg  bid to 50mg  bid. He was advised to discuss with PCP.   Interval History During the interval time, pt has been doing the same. Still has left hemiparesis. Able to walk with cane but still has significant left leg weakness and foot drop. Left UE 3-/5. Still following with Dr. Wynn BankerKirsteins for botox of left hand. Finished PT/OT, BP still on the low side 99/58, on amlodipine and metoprolol. Will decrease amlodipine dose.   REVIEW OF SYSTEMS: Full 14 system review of systems performed and notable only for those listed below and in HPI above, all others are negative:  Constitutional:   Cardiovascular:  Ear/Nose/Throat:  Ringing in ears Skin:  Eyes:   Respiratory:   Gastroitestinal:   Genitourinary: difficult urination Hematology/Lymphatic:   Endocrine:  Musculoskeletal:   Allergy/Immunology:   Neurological:   Psychiatric:  Sleep:   The following represents the patient's updated allergies and side effects list: Allergies  Allergen Reactions  . Gluten Meal Nausea And Vomiting  . Lactose Intolerance (Gi) Nausea And Vomiting  . Other     Tree nuts   . Shellfish Allergy Swelling and Rash    The neurologically relevant items on the patient's problem list were reviewed on today's visit.  Neurologic Examination  A problem  focused neurological exam (12 or more points of the single system neurologic examination, vital signs counts as 1 point, cranial nerves count for 8 points) was performed.  Blood pressure (!) 99/58, pulse 61, weight 147 lb 6.4 oz (66.9 kg).  General - Well nourished, well developed, in no apparent distress.  Ophthalmologic - Fundi not visualized due to eye movement.  Cardiovascular - Regular rate and rhythm with no murmur.  Mental Status -  Level of arousal and orientation to  time, place, and person were intact. Language including expression, naming, repetition, comprehension was assessed and found intact, mild dysarthria. Fund of Knowledge was assessed and was intact.  Cranial Nerves II - XII - II - Visual field intact OU. III, IV, VI - Extraocular movements intact. V - Facial sensation intact bilaterally. VII - left facial droop VIII - Hearing & vestibular intact bilaterally. X - Palate elevates symmetrically. XI - Chin turning & shoulder shrug intact bilaterally. XII - Tongue protrusion intact.  Motor Strength - The patient's strength was LUE 3-/5 proximal and 0/5 distal, LLE 4/5 proximal and 0/5 distal, 5/5 RUE and RLE.  Bulk was normal and fasciculations were absent.   Motor Tone - Muscle tone was assessed at the neck and appendages and was increased at LLE and LUE.  Reflexes - The patient's reflexes were 1+ in all extremities except 3+ LLE and he had left babainski with unsustained ankle clonus on the left.  Sensory - Light touch, temperature/pinprick were assessed and were symmetric.    Coordination - The patient had normal movements in the right hand with no ataxia or dysmetria.  Tremor was absent.  Gait and Station - walk with cane, spastic hemiparetic gait on the left.   Data reviewed: I personally reviewed the images and agree with the radiology interpretations.  Ct Head Wo Contrast 01/08/2016  Right basal ganglia hemorrhage with 31 cc volume. 3 mm midline shift.   CTA head and neck  01/09/2016 Unchanged RIGHT basal ganglia hemorrhage, favored to represent a hypertensive related bleed.  Volume today 32 mm, essentially unchanged. Mild 3 mm RIGHT-to-LEFT shift. No intracranial or extracranial stenosis, dissection, aneurysm, or vascular malformation.  TTE  01/09/2016 - Left ventricle: The cavity size was normal. Wall thickness was increased in a pattern of moderate LVH. Systolic function was normal. The estimated ejection fraction was  in the range of 60% to 65%. Wall motion was normal; there were no regional wall motion abnormalities. Left ventricular diastolic function parameters were normal. - Pulmonary arteries: PA peak pressure: 52 mm Hg (S). Impressions: No cardiac source of emboli was indentified.  CXR 01/09/2016 No active disease.  MRA head 04/27/16 Overall unremarkable MRA head (without). Again noted is chronic right putamen intracerebral hemorrhage demonstrates expected evolutional change and decrease in size. No definite associated or underlying vascular malformations or aneurysmal dilations.   MRI brain with and without contrast 05/25/16 This MRI of the brain with and without contrast shows the sequela of a hemorrhagic stroke that was acute on the 01/08/2016 CT scan involving the right basal ganglia and adjacent brain. There is subtle enhancement in the periventricular region of the chronic stroke. Enhancement can persist for several months after a hemorrhage. There are no acute findings on the current study.   Component     Latest Ref Rng & Units 01/09/2016  Cholesterol     0 - 200 mg/dL 161250 (H)  Triglycerides     <150 mg/dL 096212 (H)  HDL Cholesterol     >  40 mg/dL 51  Total CHOL/HDL Ratio     RATIO 4.9  VLDL     0 - 40 mg/dL 42 (H)  LDL (calc)     0 - 99 mg/dL 161 (H)  Hemoglobin W9U     4.8 - 5.6 % 5.5  Mean Plasma Glucose     mg/dL 045  TSH     4.098 - 1.191 uIU/mL 0.835  Vitamin B12     180 - 914 pg/mL 449  RPR     Non Reactive Non Reactive  HIV     Non Reactive Non Reactive    Assessment: As you may recall, he is a 61 y.o. Caucasian male with PMH of HTN, HLD, and alcohol abuse admitted no 01/08/16 for R basal ganglia hemorrhage likely due to HTN. CTA head and neck showed no AVM or aneurysm. EF 60-65%. LDL 157 and A1C 5.5. Added zoloft for depression. He was discharged to CIR after stabilization but still has left hemiplegia. Now at home undergoing outpt PT/OT/speech. Reported infrequent  occular migraine with rainbow colored imaging at right eye lasting . No associated HA. Repeat MRI and MRA showed no tumor or aneurysm or AVM. Put on ASA 81mg . Follows with Dr. Wynn Banker for botox injection. Able to walk with walker now. BP on the low side, decreased metoprolol dose from 100mg  to 50mg  bid. On this visit, his BP still low but asymptomatic, will decrease norvasc to 5mg  daily. He will follow up with PCP closely for BP management. He has psychiatry and psychology follow up next month.   Plan:  - continue ASA and pravastatin for stroke prevention   - Follow up with your primary care physician for stroke risk factor modification. Recommend maintain blood pressure goal 110-130/80, diabetes with hemoglobin A1c goal below 7.0% and lipids with LDL cholesterol goal below 70 mg/dL.  - BP at low side, will decrease amlodipine to 5mg  (half tab) a day. and check BP at home daily and record. Call PCP if BP not in good control - follow up with Dr. Wynn Banker for rehab and botox injection - continue zoloft and follow up with psychiatry or psychology - self exercise at home - follow up as needed.   I spent more than 25 minutes of face to face time with the patient. Greater than 50% of time was spent in counseling and coordination of care. We discussed continued BP management and psychiatry follow up.   No orders of the defined types were placed in this encounter.   No orders of the defined types were placed in this encounter.   Patient Instructions  - continue ASA and pravastatin for stroke prevention   - Follow up with your primary care physician for stroke risk factor modification. Recommend maintain blood pressure goal 110-130/80, diabetes with hemoglobin A1c goal below 7.0% and lipids with LDL cholesterol goal below 70 mg/dL.  - BP at low side, will decrease amlodipine to 5mg  (half tab) a day. and check BP at home daily and record. Call PCP if BP not in good control - follow up with Dr.  Wynn Banker for rehab and botox injection - continue zoloft and follow up with psychiatry or psychology - self exercise at home - follow up as needed.    Marvel Plan, MD PhD [facility] [address], [address] [place], [place] [address] [phone]

## 2017-07-06 NOTE — Patient Instructions (Signed)
-   continue ASA and pravastatin for stroke prevention   - Follow up with your primary care physician for stroke risk factor modification. Recommend maintain blood pressure goal 110-130/80, diabetes with hemoglobin A1c goal below 7.0% and lipids with LDL cholesterol goal below 70 mg/dL.  - BP at low side, will decrease amlodipine to 5mg  (half tab) a day. and check BP at home daily and record. Call PCP if BP not in good control - follow up with Dr. Wynn BankerKirsteins for rehab and botox injection - continue zoloft and follow up with psychiatry or psychology - self exercise at home - follow up as needed.

## 2017-08-21 ENCOUNTER — Encounter: Payer: BLUE CROSS/BLUE SHIELD | Attending: Physical Medicine & Rehabilitation | Admitting: Psychology

## 2017-08-21 DIAGNOSIS — I619 Nontraumatic intracerebral hemorrhage, unspecified: Secondary | ICD-10-CM | POA: Diagnosis not present

## 2017-08-21 DIAGNOSIS — R252 Cramp and spasm: Secondary | ICD-10-CM | POA: Diagnosis not present

## 2017-08-21 DIAGNOSIS — I610 Nontraumatic intracerebral hemorrhage in hemisphere, subcortical: Secondary | ICD-10-CM | POA: Diagnosis not present

## 2017-08-21 DIAGNOSIS — I629 Nontraumatic intracranial hemorrhage, unspecified: Secondary | ICD-10-CM | POA: Diagnosis not present

## 2017-08-21 DIAGNOSIS — I69319 Unspecified symptoms and signs involving cognitive functions following cerebral infarction: Secondary | ICD-10-CM | POA: Diagnosis not present

## 2017-08-21 DIAGNOSIS — F1010 Alcohol abuse, uncomplicated: Secondary | ICD-10-CM | POA: Diagnosis not present

## 2017-08-21 DIAGNOSIS — G8110 Spastic hemiplegia affecting unspecified side: Secondary | ICD-10-CM | POA: Diagnosis not present

## 2017-08-21 DIAGNOSIS — I10 Essential (primary) hypertension: Secondary | ICD-10-CM | POA: Diagnosis not present

## 2017-08-21 DIAGNOSIS — Z5189 Encounter for other specified aftercare: Secondary | ICD-10-CM | POA: Diagnosis not present

## 2017-08-21 DIAGNOSIS — G8194 Hemiplegia, unspecified affecting left nondominant side: Secondary | ICD-10-CM | POA: Diagnosis not present

## 2017-08-21 DIAGNOSIS — I639 Cerebral infarction, unspecified: Secondary | ICD-10-CM | POA: Diagnosis not present

## 2017-08-22 DIAGNOSIS — I69391 Dysphagia following cerebral infarction: Secondary | ICD-10-CM | POA: Diagnosis not present

## 2017-08-22 DIAGNOSIS — Z23 Encounter for immunization: Secondary | ICD-10-CM | POA: Diagnosis not present

## 2017-08-22 DIAGNOSIS — I10 Essential (primary) hypertension: Secondary | ICD-10-CM | POA: Diagnosis not present

## 2017-08-22 DIAGNOSIS — E782 Mixed hyperlipidemia: Secondary | ICD-10-CM | POA: Diagnosis not present

## 2017-08-22 DIAGNOSIS — I69359 Hemiplegia and hemiparesis following cerebral infarction affecting unspecified side: Secondary | ICD-10-CM | POA: Diagnosis not present

## 2017-08-24 ENCOUNTER — Ambulatory Visit: Payer: BLUE CROSS/BLUE SHIELD | Admitting: Physical Medicine & Rehabilitation

## 2017-08-24 ENCOUNTER — Encounter: Payer: Self-pay | Admitting: Physical Medicine & Rehabilitation

## 2017-08-24 VITALS — BP 114/69 | HR 61

## 2017-08-24 DIAGNOSIS — I629 Nontraumatic intracranial hemorrhage, unspecified: Secondary | ICD-10-CM | POA: Diagnosis not present

## 2017-08-24 DIAGNOSIS — G8194 Hemiplegia, unspecified affecting left nondominant side: Secondary | ICD-10-CM | POA: Diagnosis not present

## 2017-08-24 DIAGNOSIS — Z5189 Encounter for other specified aftercare: Secondary | ICD-10-CM | POA: Diagnosis not present

## 2017-08-24 DIAGNOSIS — I10 Essential (primary) hypertension: Secondary | ICD-10-CM | POA: Diagnosis not present

## 2017-08-24 DIAGNOSIS — I639 Cerebral infarction, unspecified: Secondary | ICD-10-CM | POA: Diagnosis not present

## 2017-08-24 DIAGNOSIS — R252 Cramp and spasm: Secondary | ICD-10-CM | POA: Diagnosis not present

## 2017-08-24 DIAGNOSIS — F1010 Alcohol abuse, uncomplicated: Secondary | ICD-10-CM | POA: Diagnosis not present

## 2017-08-24 DIAGNOSIS — G8110 Spastic hemiplegia affecting unspecified side: Secondary | ICD-10-CM | POA: Insufficient documentation

## 2017-08-24 NOTE — Progress Notes (Signed)
Botox Injection for spasticity using needle EMG guidance  Dilution: 50 Units/ml Indication: Severe spasticity which interferes with ADL,mobility and/or  hygiene and is unresponsive to medication management and other conservative care Informed consent was obtained after describing risks and benefits of the procedure with the patient. This includes bleeding, bruising, infection, excessive weakness, or medication side effects. A REMS form is on file and signed. Needle: 25g 2" needle electrode Number of units per muscle FDS50 FDP 50 FCR 50 FCU 25 FPL 25 All injections were done after obtaining appropriate EMG activity and after negative drawback for blood. The patient tolerated the procedure well. Post procedure instructions were given. A followup appointment was made.

## 2017-08-24 NOTE — Patient Instructions (Signed)

## 2017-08-25 DIAGNOSIS — G43809 Other migraine, not intractable, without status migrainosus: Secondary | ICD-10-CM | POA: Diagnosis not present

## 2017-08-25 DIAGNOSIS — H35033 Hypertensive retinopathy, bilateral: Secondary | ICD-10-CM | POA: Diagnosis not present

## 2017-08-25 DIAGNOSIS — H2513 Age-related nuclear cataract, bilateral: Secondary | ICD-10-CM | POA: Diagnosis not present

## 2017-08-25 DIAGNOSIS — H02831 Dermatochalasis of right upper eyelid: Secondary | ICD-10-CM | POA: Diagnosis not present

## 2017-09-24 ENCOUNTER — Encounter: Payer: Self-pay | Admitting: Psychology

## 2017-09-24 NOTE — Progress Notes (Signed)
Neuropsychological Consultation   Patient:   Jon [name]   DOB:   1956/02/02  MR Number:  [id number]  Location:  [facility] PHYSICAL MEDICINE AND REHABILITATION [address], [place] [id number] [place] [address] Dept: [phone]           Date of Service:   08/21/2017  Start Time:   2 PM End Time:   3 PM  Provider/Observer:  Arley Phenix, Psy.D.       Clinical Neuropsychologist       Billing Code/Service: [phone] 4 Units  Chief Complaint:    Jon [name] presents as a 62 year old history of hypertension alcohol use.  The patient presented on January 08, 2016 with acute onset of headache blood pressure was elevated.  CT of the head showed right basal ganglia hemorrhage with 31 mL volume and 3 mm midline shift.  The patient has not been drinking since his stroke.  He has been attending outpatient physical therapy.  Ongoing issues with spasticity of his left upper extremity greater than left lower extremity.  The patient has had minimal response to baclofen.  The patient was referred by Dr. Wynn Banker for neuropsychological/psychological consultation.  The patient has continued to struggle with the loss of function following his stroke.  The patient reports that he thinks about his functional loss all of the time.  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  The patient has been continuing his art adjusting to his motor deficits the patient has been unable to play the guitar.  He can use his left arm or hand.  He has tried playing the keyboard with just his right hand but this is limited.  The patient reports that he is constantly thinking about playing songs in his head done this before.  However, he reports that now he cannot play them.  He reports that he is suggesting too much of the other issues but does have some cognitive issues related to attention and  memory.  The patient reports that he has completely quit drinking alcohol and quit smoking cigarettes and is lost 50 pounds.  He reports that his mood is okay except when he is around his wife and she starts singing again active and engaged.  Current Status:  The patient describes considerable frustration about his loss of function and loss of ability to use his left arm and hand.  These are residual effects of his stroke that he had in June 2017.  The patient reports that he has been able to start doing some more work utilizing his right hand but has not been able to play the guitar which was 1 of his passions.  Behavioral Observation: Jon [name]  presents as a 62 y.o.-year-old  Caucasian Male who appeared his stated age. his dress was Appropriate and he was Well Groomed and his manners were Appropriate to the situation.  his participation was indicative of Appropriate behaviors.  There were any physical disabilities noted.  he displayed an appropriate level of cooperation and motivation.     Interactions:    Active Appropriate  Attention:   abnormal and attention span appeared shorter than expected for age  Memory:   within normal limits; recent and remote memory intact  Visuo-spatial:  not examined  Speech (Volume):  normal  Speech:   normal;   Thought Process:  Coherent and Relevant  Though  Content:  WNL; not suicidal and not homicidal  Orientation:   person, place, time/date and situation  Judgment:   Good  Planning:   Good  Affect:    Depressed  Mood:    Dysphoric  Insight:   Good  Intelligence:   normal  Marital Status/Living: The patient was born and raised in [place].  Current Employment: Patient is not working at this point.  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  Substance Use:  No concerns of substance abuse are reported.the patient acknowledges a long history of alcohol consumption and cigarette  smoking.  He has completely quit both of these and has lost over 50 pounds since his stroke.  Education:   HS Graduate  Medical History:   Past Medical History:  Diagnosis Date  . Hypertension   . Liver damage   . Stroke [facility](HCC)       Psychiatric History:  Patient denies any past psychiatric history.  Family Med/Psych History: History reviewed. No pertinent family history.  Risk of Suicide/Violence: low patient denies any suicidal or homicidal ideation.  Impression/DX:  Jon [name] presents as a 62 year old history of hypertension alcohol use.  The patient presented on January 08, 2016 with acute onset of headache blood pressure was elevated.  CT of the head showed right basal ganglia hemorrhage with 31 mL volume and 3 mm midline shift.  The patient has not been drinking since his stroke.  He has been attending outpatient physical therapy.  Ongoing issues with spasticity of his left upper extremity greater than left lower extremity.  The patient has had minimal response to baclofen.  The patient was referred by Dr. Wynn BankerKirsteins for neuropsychological/psychological consultation.  The patient has continued to struggle with the loss of function following his stroke.  The patient reports that he thinks about his functional loss all of the time.  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  The patient has been continuing his art adjusting to his motor deficits the patient has been unable to play the guitar.  He can use his left arm or hand.  He has tried playing the keyboard with just his right hand but this is limited.  The patient reports that he is constantly thinking about playing songs in his head done this before.  However, he reports that now he cannot play them.  He reports that he is suggesting too much of the other issues but does have some cognitive issues related to attention and memory.  The patient reports that he has completely quit drinking  alcohol and quit smoking cigarettes and is lost 50 pounds.  He reports that his mood is okay except when he is around his wife and she starts singing again active and engaged.  The patient describes considerable frustration about his loss of function and loss of ability to use his left arm and hand.  These are residual effects of his stroke that he had in June 2017.  The patient reports that he has been able to start doing some more work utilizing his right hand but has not been able to play the guitar which was 1 of his passions.  Disposition/Plan:  We have set the patient up for individual psychotherapeutic intervention to help cope with loss of motor function in his left hand and arm.  The patient has had a difficult time dealing with and  adjusting to loss of functioning he experienced following his hemorrhagic stroke.  Diagnosis:    Cognitive deficit, post-stroke  Spastic hemiplegia affecting nondominant side (HCC)  Hemorrhagic stroke (HCC) R basal ganglia d/t HTN  Basal ganglia hemorrhage (HCC)         Electronically Signed   _______________________ Arley Phenix, Psy.D.

## 2017-09-28 DIAGNOSIS — L729 Follicular cyst of the skin and subcutaneous tissue, unspecified: Secondary | ICD-10-CM | POA: Diagnosis not present

## 2017-09-28 DIAGNOSIS — B349 Viral infection, unspecified: Secondary | ICD-10-CM | POA: Diagnosis not present

## 2017-09-28 DIAGNOSIS — A6002 Herpesviral infection of other male genital organs: Secondary | ICD-10-CM | POA: Diagnosis not present

## 2017-10-05 ENCOUNTER — Ambulatory Visit: Payer: BLUE CROSS/BLUE SHIELD

## 2017-10-05 ENCOUNTER — Ambulatory Visit: Payer: Self-pay | Admitting: Physical Medicine & Rehabilitation

## 2017-10-06 ENCOUNTER — Ambulatory Visit: Payer: BLUE CROSS/BLUE SHIELD | Admitting: Physical Medicine & Rehabilitation

## 2017-10-06 ENCOUNTER — Encounter: Payer: Self-pay | Admitting: Physical Medicine & Rehabilitation

## 2017-10-06 ENCOUNTER — Other Ambulatory Visit: Payer: Self-pay

## 2017-10-06 VITALS — BP 115/69 | HR 60

## 2017-10-06 DIAGNOSIS — I629 Nontraumatic intracranial hemorrhage, unspecified: Secondary | ICD-10-CM | POA: Diagnosis not present

## 2017-10-06 DIAGNOSIS — I10 Essential (primary) hypertension: Secondary | ICD-10-CM | POA: Diagnosis not present

## 2017-10-06 DIAGNOSIS — F1010 Alcohol abuse, uncomplicated: Secondary | ICD-10-CM | POA: Insufficient documentation

## 2017-10-06 DIAGNOSIS — R252 Cramp and spasm: Secondary | ICD-10-CM | POA: Diagnosis not present

## 2017-10-06 DIAGNOSIS — G8110 Spastic hemiplegia affecting unspecified side: Secondary | ICD-10-CM

## 2017-10-06 DIAGNOSIS — I639 Cerebral infarction, unspecified: Secondary | ICD-10-CM | POA: Diagnosis not present

## 2017-10-06 DIAGNOSIS — G8194 Hemiplegia, unspecified affecting left nondominant side: Secondary | ICD-10-CM | POA: Insufficient documentation

## 2017-10-06 DIAGNOSIS — Z5189 Encounter for other specified aftercare: Secondary | ICD-10-CM | POA: Diagnosis not present

## 2017-10-06 NOTE — Progress Notes (Signed)
Subjective:    Patient ID: Jon [name], male    DOB: [birth year], 62 y.o.   MRN: [id number]  HPI  62 year old male with history of right basal ganglia hemorrhage onset in 2017.  He has had left spastic hemiplegia.  He has completed both inpatient and outpatient rehabilitation.  The patient underwent botulinum toxin injection 08/24/2017.  He is pleased with the results.  Following muscle groups were injected  FDS50 FDP 50 FCR 50 FCU 25 FPL 25 Pain Inventory Average Pain 0 Pain Right Now 0 My pain is no pain  In the last 24 hours, has pain interfered with the following? General activity 0 Relation with others 0 Enjoyment of life 0 What TIME of day is your pain at its worst? no pain Sleep (in general) NA  Pain is worse with: no pain Pain improves with: no pain Relief from Meds: no pain  Mobility use a cane how many minutes can you walk? 5-10 ability to climb steps?  no do you drive?  no  Function disabled: date disabled 2017 I need assistance with the following:  household duties and shopping  Neuro/Psych numbness confusion loss of taste or smell  Prior Studies Any changes since last visit?  no  Physicians involved in your care Any changes since last visit?  no   No family history on file. Social History   Socioeconomic History  . Marital status: Married    Spouse name: Not on file  . Number of children: Not on file  . Years of education: Not on file  . Highest education level: Not on file  Social Needs  . Financial resource strain: Not on file  . Food insecurity - worry: Not on file  . Food insecurity - inability: Not on file  . Transportation needs - medical: Not on file  . Transportation needs - non-medical: Not on file  Occupational History  . Not on file  Tobacco Use  . Smoking status: Former Smoker    Packs/day: 0.50    Years: 19.00    Pack years: 9.50    Types: Cigarettes    Last attempt to quit: 02/22/2000    Years since quitting:  17.6  . Smokeless tobacco: Never Used  Substance and Sexual Activity  . Alcohol use: No    Alcohol/week: 7.2 oz    Types: 12 Glasses of wine per week    Comment: Wife reports chronic alcohol use. 01/12/16  . Drug use: No    Comment: teens to early 20's  . Sexual activity: Not on file  Other Topics Concern  . Not on file  Social History Narrative  . Not on file   Past Surgical History:  Procedure Laterality Date  . TONSILLECTOMY AND ADENOIDECTOMY Bilateral    Age 12   Past Medical History:  Diagnosis Date  . Hypertension   . Liver damage   . Stroke (HCC)    BP 115/69   Pulse 60   SpO2 96%   Opioid Risk Score:   Fall Risk Score:  `1  Depression screen PHQ 2/9  Depression screen [facility] 2/9 10/06/2017 05/19/2016 04/21/2016  Decreased Interest 0 0 0  Down, Depressed, Hopeless 0 0 0  PHQ - 2 Score 0 0 0     Review of Systems  Constitutional: Negative.   HENT: Negative.   Eyes: Negative.   Cardiovascular: Negative.   Gastrointestinal: Negative.   Endocrine: Negative.   Genitourinary: Negative.   Musculoskeletal: Negative.   Skin: Negative.  Allergic/Immunologic: Negative.   Neurological: Positive for numbness.  Hematological: Negative.   Psychiatric/Behavioral: Positive for confusion.  All other systems reviewed and are negative.      Objective:   Physical Exam  Modified Ashworth scores Left elbow flexor 1 Left finger flexors 2 Left wrist flexor 1 Left thumb flexor 2  Motor strength is trace finger flexors 0 finger extensors on the left 2- at the deltoid to minus at the biceps 5/5 strength in the right side.       Assessment & Plan:  1.  Left spastic hemiplegia with improvements in left upper extremity finger and wrist flexor spasticity following botulinum toxin injection.  We used a higher dose at last visit.  This has resulted in improved relaxation of muscle groups injected.  Will repeat in 6 weeks using the same dosing and muscle group  selection. Discussed with patient and his wife agree with plan.

## 2017-10-19 ENCOUNTER — Encounter: Payer: BLUE CROSS/BLUE SHIELD | Attending: Physical Medicine & Rehabilitation | Admitting: Psychology

## 2017-10-19 ENCOUNTER — Encounter: Payer: Self-pay | Admitting: Psychology

## 2017-10-19 DIAGNOSIS — F1010 Alcohol abuse, uncomplicated: Secondary | ICD-10-CM | POA: Diagnosis not present

## 2017-10-19 DIAGNOSIS — I69319 Unspecified symptoms and signs involving cognitive functions following cerebral infarction: Secondary | ICD-10-CM

## 2017-10-19 DIAGNOSIS — G8110 Spastic hemiplegia affecting unspecified side: Secondary | ICD-10-CM | POA: Diagnosis not present

## 2017-10-19 DIAGNOSIS — I10 Essential (primary) hypertension: Secondary | ICD-10-CM | POA: Diagnosis not present

## 2017-10-19 DIAGNOSIS — G8194 Hemiplegia, unspecified affecting left nondominant side: Secondary | ICD-10-CM | POA: Diagnosis not present

## 2017-10-19 DIAGNOSIS — I639 Cerebral infarction, unspecified: Secondary | ICD-10-CM | POA: Diagnosis not present

## 2017-10-19 DIAGNOSIS — Z5189 Encounter for other specified aftercare: Secondary | ICD-10-CM | POA: Diagnosis not present

## 2017-10-19 DIAGNOSIS — I629 Nontraumatic intracranial hemorrhage, unspecified: Secondary | ICD-10-CM | POA: Diagnosis not present

## 2017-10-19 DIAGNOSIS — R252 Cramp and spasm: Secondary | ICD-10-CM | POA: Diagnosis not present

## 2017-10-19 NOTE — Progress Notes (Signed)
Patient:  Jon [name]   DOB: Nov 16, 1955  MR Number: [id number]  [facility] [address], [place] [address] [id number] [place] [id number] Dept: [phone]  Start: 1 PM End: 2 PM  Provider/Observer:     Hershal CoriaJohn R Arleta Ostrum PSYD  Chief Complaint:      Chief Complaint  Patient presents with  . Stress  . Dizziness  . Anxiety    Reason For Service:     Jon CoolerGeorge D Markov presents as a [age] year old history of hypertension alcohol use.  The patient presented on January 08, 2016 with acute onset of headache blood pressure was elevated.  CT of the head showed right basal ganglia hemorrhage with 31 mL volume and 3 mm midline shift.  The patient has not been drinking since his stroke.  He has been attending outpatient physical therapy.  Ongoing issues with spasticity of his left upper extremity greater than left lower extremity.  The patient has had minimal response to baclofen.  The patient was referred by Dr. Wynn BankerKirsteins for neuropsychological/psychological consultation.  The patient has continued to struggle with the loss of function following his stroke.  The patient reports that he thinks about his functional loss all of the time.  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  The patient has been continuing his art adjusting to his motor deficits the patient has been unable to play the guitar.  He can use his left arm or hand.  He has tried playing the keyboard with just his right hand but this is limited.  The patient reports that he is constantly thinking about playing songs in his head done this before.  However, he reports that now he cannot play them.  He reports that he is suggesting too much of the other issues but does have some cognitive issues related to attention and memory.  The patient reports that he has completely quit drinking alcohol  and quit smoking cigarettes and is lost 50 pounds.  He reports that his mood is okay except when he is around his wife and she starts singing again active and engaged.   Interventions Strategy:  Cognitive/behavioral therapeutic interventions as well as building coping skills and adaptive abilities following a significant CVA.  Participation Level:   Active  Participation Quality:  Appropriate      Behavioral Observation:  Well Groomed, Lethargic, and Appropriate.   Current Psychosocial Factors: The patient reports that he has been working on trying to increase his activity levels but his motivation continues to be problematic.  The patient has a very pessimistic approach to life which is pre-existing and his pessimism becomes highlighted and reinforced with the significant limitations he is experiencing because of left-sided weakness and particular deficits in his left hand.  The patient is left-handed so this is his dominant hand.  Content of Session:   Reviewed current symptoms and continue to work on therapeutic interventions around coping and adjustment skills.  Current Status:   The patient reports that he is having significant difficulty motivating himself to do things.  He reports that he looks around his house in his yard and sees all the things that he would have taken care of and been active and doing particularly as spring is approaching.  The patient's motivation then gets hit when he realizes all of the things that he cannot do and has difficulty  identifying and become motivated to do the things he can do as those are judged to be less achievement then he has prior expectations leave him to.  Patient Progress:   The patient has been working on some of the initial coping skills and strategies we have developed but the challenge for him is to begin to approach and perceive his world from a different perspective.  Last Reviewed:   10/19/2017  Impression/Diagnosis:   The patient has a  lot of frustration with the loss of function following his right basal ganglia hemorrhage and this is producing negative emotional response and adjustment difficulties.  Diagnosis:   Spastic hemiplegia affecting dominant side (HCC)  Cognitive deficit, post-stroke

## 2017-11-02 ENCOUNTER — Encounter: Payer: BLUE CROSS/BLUE SHIELD | Attending: Physical Medicine & Rehabilitation | Admitting: Psychology

## 2017-11-02 ENCOUNTER — Encounter: Payer: Self-pay | Admitting: Psychology

## 2017-11-02 DIAGNOSIS — G8110 Spastic hemiplegia affecting unspecified side: Secondary | ICD-10-CM

## 2017-11-02 DIAGNOSIS — I10 Essential (primary) hypertension: Secondary | ICD-10-CM | POA: Diagnosis not present

## 2017-11-02 DIAGNOSIS — Z5189 Encounter for other specified aftercare: Secondary | ICD-10-CM | POA: Diagnosis not present

## 2017-11-02 DIAGNOSIS — F1010 Alcohol abuse, uncomplicated: Secondary | ICD-10-CM | POA: Insufficient documentation

## 2017-11-02 DIAGNOSIS — I629 Nontraumatic intracranial hemorrhage, unspecified: Secondary | ICD-10-CM | POA: Diagnosis not present

## 2017-11-02 DIAGNOSIS — I69319 Unspecified symptoms and signs involving cognitive functions following cerebral infarction: Secondary | ICD-10-CM

## 2017-11-02 DIAGNOSIS — I639 Cerebral infarction, unspecified: Secondary | ICD-10-CM | POA: Diagnosis not present

## 2017-11-02 DIAGNOSIS — G8194 Hemiplegia, unspecified affecting left nondominant side: Secondary | ICD-10-CM | POA: Insufficient documentation

## 2017-11-02 DIAGNOSIS — R252 Cramp and spasm: Secondary | ICD-10-CM | POA: Insufficient documentation

## 2017-11-02 DIAGNOSIS — I610 Nontraumatic intracerebral hemorrhage in hemisphere, subcortical: Secondary | ICD-10-CM | POA: Diagnosis not present

## 2017-11-02 NOTE — Progress Notes (Signed)
Patient:  Jon [name]   DOB: 02-05-1956  MR Number: [id number]  Location: [facility] FOR PAIN AND [facility] MEDICINE [facility] PHYSICAL MEDICINE AND REHABILITATION [address], [place] [address] [id number] [place] [address] Dept: [phone]  Start: 1 PM End: 2 PM  Provider/Observer:     Hershal Coria PSYD  Chief Complaint:      Chief Complaint  Patient presents with  . Anxiety  . Memory Loss  . Fatigue    Reason For Service:     [name] presents as a 62 year old history of hypertension alcohol use.  The patient presented on January 08, 2016 with acute onset of headache blood pressure was elevated.  CT of the head showed right basal ganglia hemorrhage with 31 mL volume and 3 mm midline shift.  The patient has not been drinking since his stroke.  He has been attending outpatient physical therapy.  Ongoing issues with spasticity of his left upper extremity greater than left lower extremity.  The patient has had minimal response to baclofen.  The patient was referred by Dr. Wynn Banker for neuropsychological/psychological consultation.  The patient has continued to struggle with the loss of function following his stroke.  The patient reports that he thinks about his functional loss all of the time.  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  The patient has been continuing his art adjusting to his motor deficits the patient has been unable to play the guitar.  He can use his left arm or hand.  He has tried playing the keyboard with just his right hand but this is limited.  The patient reports that he is constantly thinking about playing songs in his head done this before.  However, he reports that now he cannot play them.  He reports that he is suggesting too much of the other issues but does have some cognitive issues related to attention and memory.  The patient reports that he has completely quit drinking  alcohol and quit smoking cigarettes and is lost 50 pounds.  He reports that his mood is okay except when he is around his wife and she starts singing again active and engaged.  These history factors have not changed since last reviewed.     Interventions Strategy:  Cognitive/behavioral therapeutic intervention as well as building coping skills and adaptive abilities following significant CVA.   Participation Level:   Active  Participation Quality:  Appropriate and Attentive      Behavioral Observation:  Well Groomed, Lethargic, and Appropriate.   Current Psychosocial Factors: The patient reports that he is continuing to work on trying to increase his activity levels.  However, the patient reports that his motivation continues to be problematic for him.  The patient describes some of his current symptoms to be similar to like it was if he had smoked marijuana.  The patient reports that he is not drinking or smoking or doing any substance abuse at all.  The patient reports that he has been experiencing some anxiety which leaves him wanting to avoid external or outside activities.  Content of Session:   Reviewed current symptoms and continue to work on therapeutic interventions around coping and adjustment skills.   Current Status:   The patient continues to report that he is having significant difficulties with motivation although some of these issues may be directly related to his left frontal involvement from his stroke.  The patient reports  that he will often look around his home and yard and see things that he wishes he would do but has difficulty motivating to do them.  Patient Progress:   The patient continues to work on Pharmacologistcoping skills and adaptive abilities we are developing.  The patient reports that he is doing more of the interactions and coping strategies that we have developed and he feels like it is helping some with his improvement.  Last  Reviewed:   11/02/2017  Impression/Diagnosis:   The patient continues to have a lot of frustration with loss of function following his right basal ganglia hemorrhage and this is producing negative emotional response and frustrations.  The patient is having difficulties with motivation etc.  Diagnosis:   Spastic hemiplegia affecting dominant side (HCC)  Cognitive deficit, post-stroke  Basal ganglia hemorrhage (HCC)

## 2017-11-16 ENCOUNTER — Encounter: Payer: Self-pay | Admitting: Psychology

## 2017-11-16 ENCOUNTER — Encounter (HOSPITAL_BASED_OUTPATIENT_CLINIC_OR_DEPARTMENT_OTHER): Payer: BLUE CROSS/BLUE SHIELD | Admitting: Psychology

## 2017-11-16 DIAGNOSIS — I639 Cerebral infarction, unspecified: Secondary | ICD-10-CM | POA: Diagnosis not present

## 2017-11-16 DIAGNOSIS — G8110 Spastic hemiplegia affecting unspecified side: Secondary | ICD-10-CM | POA: Diagnosis not present

## 2017-11-16 DIAGNOSIS — Z5189 Encounter for other specified aftercare: Secondary | ICD-10-CM | POA: Diagnosis not present

## 2017-11-16 DIAGNOSIS — I10 Essential (primary) hypertension: Secondary | ICD-10-CM | POA: Diagnosis not present

## 2017-11-16 DIAGNOSIS — I610 Nontraumatic intracerebral hemorrhage in hemisphere, subcortical: Secondary | ICD-10-CM | POA: Diagnosis not present

## 2017-11-16 DIAGNOSIS — F1010 Alcohol abuse, uncomplicated: Secondary | ICD-10-CM | POA: Diagnosis not present

## 2017-11-16 DIAGNOSIS — I69319 Unspecified symptoms and signs involving cognitive functions following cerebral infarction: Secondary | ICD-10-CM | POA: Diagnosis not present

## 2017-11-16 DIAGNOSIS — R252 Cramp and spasm: Secondary | ICD-10-CM | POA: Diagnosis not present

## 2017-11-16 DIAGNOSIS — I629 Nontraumatic intracranial hemorrhage, unspecified: Secondary | ICD-10-CM | POA: Diagnosis not present

## 2017-11-16 DIAGNOSIS — G8194 Hemiplegia, unspecified affecting left nondominant side: Secondary | ICD-10-CM | POA: Diagnosis not present

## 2017-11-16 NOTE — Progress Notes (Signed)
Patient:  Jon [name]   DOB: 06-14-1956  MR Number: [id number]  Location: [facility] FOR PAIN AND REHABILITATIVE MEDICINE [facility] PHYSICAL MEDICINE AND REHABILITATION [address], [place] [address] [id number] [place] [address] Dept: [phone]  Start: 1 p.m. End: 2 PM  Provider/Observer:     Hershal Coria PSYD  Chief Complaint:      Chief Complaint  Patient presents with  . Fatigue  . Depression    Reason For Service:     NATNAEL BIEDERMAN presents as a 62 year old history of hypertension alcohol use.  The patient presented on January 08, 2016 with acute onset of headache blood pressure was elevated.  CT of the head showed right basal ganglia hemorrhage with 31 mL volume and 3 mm midline shift.  The patient has not been drinking since his stroke.  He has been attending outpatient physical therapy.  Ongoing issues with spasticity of his left upper extremity greater than left lower extremity.  The patient has had minimal response to baclofen.  The patient was referred by Dr. Wynn Banker for neuropsychological/psychological consultation.  The patient has continued to struggle with the loss of function following his stroke.  The patient reports that he thinks about his functional loss all of the time.  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  The patient has been continuing his art adjusting to his motor deficits the patient has been unable to play the guitar.  He can use his left arm or hand.  He has tried playing the keyboard with just his right hand but this is limited.  The patient reports that he is constantly thinking about playing songs in his head done this before.  However, he reports that now he cannot play them.  He reports that he is suggesting too much of the other issues but does have some cognitive issues related to attention and memory.  The patient reports that he has completely quit drinking alcohol and  quit smoking cigarettes and is lost 50 pounds.  He reports that his mood is okay except when he is around his wife and she starts singing again active and engaged.  The reason for service remains unchanged and the above information remains valid for this appointment.  It was reviewed.   Interventions Strategy:  Cognitive/behavioral therapeutic interventions as well as building coping skills and adaptive abilities following significant CVA.  Participation Level:   Active  Participation Quality:  Appropriate and Attentive      Behavioral Observation:  Well Groomed, Alert and Lethargic, and Appropriate.   Current Psychosocial Factors: The patient reports that he continues to have difficulties trying to increase activity levels.  Patient reports that he was able to work on his lawnmower but was only able to do a very small amount with a push mower.  His wife is taken over these duties and while he is appreciative of her doing these efforts along with a neighbor helping it has a significant impact on his self-esteem and self-confidence.  The patient reports that he is trying to figure out how to get a riding lawn more that he can afford and hoping that he is able to find one that he can use and do some of these activities.  Content of Session:   Reviewed current symptoms and continue to work on therapeutic interventions around issues of coping skills and adjustment skills.  Current Status:   The patient continues  to report that difficulties with motivation and significant motor deficits continue to be quite problematic for him.  The patient reports that he has always wanted to be outside working on his yard and doing other "tinkering" around the house.  The patient reports that this is very difficult for him to do at this point.  Patient Progress:   The patient has been actively working on his therapeutic and coping skills and we are continuing to work on further development.  The patient reports that  he has been having more interactions with others and the coping strategies have helped with the social interaction.  However, because of his ongoing significant hemiparesis the patient has been unable to do a lot of the physical things around the house that he desperately wants to be able to do.  Last Reviewed:   11/16/2017  Impression/Diagnosis:   The patient continues to have a lot of frustration with loss of function following his right basal ganglia hemorrhage and this is producing negative emotional response and frustrations.  The patient is having difficulties with motivation etc.  Diagnosis:   Spastic hemiplegia affecting dominant side (HCC)  Cognitive deficit, post-stroke  Basal ganglia hemorrhage (HCC)

## 2017-11-20 ENCOUNTER — Encounter: Payer: Self-pay | Admitting: Physical Medicine & Rehabilitation

## 2017-11-20 ENCOUNTER — Other Ambulatory Visit: Payer: Self-pay

## 2017-11-20 ENCOUNTER — Ambulatory Visit (HOSPITAL_BASED_OUTPATIENT_CLINIC_OR_DEPARTMENT_OTHER): Payer: BLUE CROSS/BLUE SHIELD | Admitting: Physical Medicine & Rehabilitation

## 2017-11-20 VITALS — BP 115/71 | HR 64

## 2017-11-20 DIAGNOSIS — F1010 Alcohol abuse, uncomplicated: Secondary | ICD-10-CM | POA: Diagnosis not present

## 2017-11-20 DIAGNOSIS — Z5189 Encounter for other specified aftercare: Secondary | ICD-10-CM | POA: Diagnosis not present

## 2017-11-20 DIAGNOSIS — R252 Cramp and spasm: Secondary | ICD-10-CM | POA: Diagnosis not present

## 2017-11-20 DIAGNOSIS — I10 Essential (primary) hypertension: Secondary | ICD-10-CM | POA: Diagnosis not present

## 2017-11-20 DIAGNOSIS — I629 Nontraumatic intracranial hemorrhage, unspecified: Secondary | ICD-10-CM | POA: Diagnosis not present

## 2017-11-20 DIAGNOSIS — G8110 Spastic hemiplegia affecting unspecified side: Secondary | ICD-10-CM | POA: Diagnosis not present

## 2017-11-20 DIAGNOSIS — I639 Cerebral infarction, unspecified: Secondary | ICD-10-CM | POA: Diagnosis not present

## 2017-11-20 DIAGNOSIS — G8194 Hemiplegia, unspecified affecting left nondominant side: Secondary | ICD-10-CM | POA: Diagnosis not present

## 2017-11-20 NOTE — Progress Notes (Signed)
Botox Injection for spasticity using needle EMG guidance  Dilution: 50 Units/ml Indication: Severe spasticity which interferes with ADL,mobility and/or  hygiene and is unresponsive to medication management and other conservative care Informed consent was obtained after describing risks and benefits of the procedure with the patient. This includes bleeding, bruising, infection, excessive weakness, or medication side effects. A REMS form is on file and signed. Needle: 25g 2" needle electrode Number of units per muscle FDS50 FDP 50 FCR 50 FCU 25 FPL 25 All injections were done after obtaining appropriate EMG activity and after negative drawback for blood. The patient tolerated the procedure well. Post procedure instructions were given. A followup appointment was made.

## 2017-11-20 NOTE — Procedures (Signed)
  Botox Injection for spasticity using needle EMG guidance  Dilution: 50 Units/ml Indication: Severe spasticity which interferes with ADL,mobility and/or  hygiene and is unresponsive to medication management and other conservative care Informed consent was obtained after describing risks and benefits of the procedure with the patient. This includes bleeding, bruising, infection, excessive weakness, or medication side effects. A REMS form is on file and signed. Needle: 27g 1" needle electrode Number of units per muscle FDS50 FDP 50 FCR 50 FCU 25 FPL 25  All injections were done after obtaining appropriate EMG activity and after negative drawback for blood. The patient tolerated the procedure well. Post procedure instructions were given. A followup appointment was made.

## 2017-11-20 NOTE — Patient Instructions (Signed)

## 2017-11-26 ENCOUNTER — Other Ambulatory Visit: Payer: Self-pay | Admitting: Physical Medicine & Rehabilitation

## 2017-12-06 DIAGNOSIS — M21612 Bunion of left foot: Secondary | ICD-10-CM | POA: Diagnosis not present

## 2017-12-06 DIAGNOSIS — M79672 Pain in left foot: Secondary | ICD-10-CM | POA: Diagnosis not present

## 2017-12-06 DIAGNOSIS — R2689 Other abnormalities of gait and mobility: Secondary | ICD-10-CM | POA: Diagnosis not present

## 2017-12-06 DIAGNOSIS — B351 Tinea unguium: Secondary | ICD-10-CM | POA: Diagnosis not present

## 2017-12-06 DIAGNOSIS — M79671 Pain in right foot: Secondary | ICD-10-CM | POA: Diagnosis not present

## 2017-12-06 DIAGNOSIS — L84 Corns and callosities: Secondary | ICD-10-CM | POA: Diagnosis not present

## 2017-12-08 ENCOUNTER — Telehealth: Payer: Self-pay | Admitting: Physical Medicine & Rehabilitation

## 2017-12-08 NOTE — Telephone Encounter (Signed)
Pt stated that Pt's Baclofen was 3x daily, pt's spouse advises the dosage is usually 4x a day. Pt's spouse stated he was shorted on meds. Please advise.

## 2017-12-10 NOTE — Telephone Encounter (Signed)
The last I had was Baclofen  TID  Wrote a 3 mo supply #270 Pt states he takes QID which is ok by me, may  Call in new RX Baclofen  QID #360 no refill

## 2017-12-11 MED ORDER — BACLOFEN 20 MG PO TABS: 20.0000 mg | ORAL_TABLET | Freq: Four times a day (QID) | ORAL | 0 refills | Status: DC

## 2017-12-11 NOTE — Telephone Encounter (Signed)
New prescription E-scribed per doctors instructions.

## 2018-01-01 ENCOUNTER — Ambulatory Visit: Payer: BLUE CROSS/BLUE SHIELD | Admitting: Physical Medicine & Rehabilitation

## 2018-01-01 ENCOUNTER — Other Ambulatory Visit: Payer: Self-pay

## 2018-01-01 ENCOUNTER — Encounter: Payer: Self-pay | Admitting: Physical Medicine & Rehabilitation

## 2018-01-01 ENCOUNTER — Encounter: Payer: BLUE CROSS/BLUE SHIELD | Attending: Physical Medicine & Rehabilitation

## 2018-01-01 VITALS — BP 117/70 | HR 58 | Ht 71.0 in | Wt 147.8 lb

## 2018-01-01 DIAGNOSIS — Z87891 Personal history of nicotine dependence: Secondary | ICD-10-CM | POA: Diagnosis not present

## 2018-01-01 DIAGNOSIS — I10 Essential (primary) hypertension: Secondary | ICD-10-CM | POA: Insufficient documentation

## 2018-01-01 DIAGNOSIS — G8110 Spastic hemiplegia affecting unspecified side: Secondary | ICD-10-CM

## 2018-01-01 DIAGNOSIS — I69254 Hemiplegia and hemiparesis following other nontraumatic intracranial hemorrhage affecting left non-dominant side: Secondary | ICD-10-CM | POA: Insufficient documentation

## 2018-01-01 DIAGNOSIS — K769 Liver disease, unspecified: Secondary | ICD-10-CM | POA: Diagnosis not present

## 2018-01-01 NOTE — Progress Notes (Signed)
Subjective:    Patient ID: Jon [name], male    DOB: December 23, 1955, 62 y.o.   MRN: [id number] 62 year old right-handed male with history of hypertension as well as alcohol use, on no prescription medications. Married, working full-time. Presented January 08, 2016, with acute onset of headache and left-sided weakness. Initial blood pressures were reportedly elevated. CT of the head showed right basal ganglia hemorrhage with 31 mL volume, 3 mm midline shift. CT angiogram of the head and neck with no extracranial stenosis or dissection. Echocardiogram with ejection fraction of 65%, no wall motion abnormalities. The patient did not receive tPA HPI 62 year old male with history of chronic left spastic hemiplegia secondary to right basal ganglia hemorrhage in 2017.  He is here today as a follow-up to botulinum toxin injection for his left upper extremity spasticity. The patient notes good relaxation of his fingers and wrist on the left side.  He did not have any postinjection soreness. He continues to have decreased sensation on the left side compared to the right side although he sometimes feels like it is tingling. Continues to ambulate with a cane.  No falls reported Pain Inventory Average Pain 0 Pain Right Now 0 My pain is no pain  In the last 24 hours, has pain interfered with the following? General activity 0 Relation with others 0 Enjoyment of life 0 What TIME of day is your pain at its worst? no pain Sleep (in general) NA  Pain is worse with: no pain Pain improves with: no pain Relief from Meds: no pain  Mobility use a cane how many minutes can you walk? 10 ability to climb steps?  no do you drive?  no  Function disabled: date disabled . I need assistance with the following:  meal prep, household duties and shopping  Neuro/Psych No problems in this area  Prior Studies Any changes since last visit?  no  Physicians involved in your care Any changes since last  visit?  no   No family history on file. Social History   Socioeconomic History  . Marital status: Married    Spouse name: Not on file  . Number of children: Not on file  . Years of education: Not on file  . Highest education level: Not on file  Occupational History  . Not on file  Social Needs  . Financial resource strain: Not on file  . Food insecurity:    Worry: Not on file    Inability: Not on file  . Transportation needs:    Medical: Not on file    Non-medical: Not on file  Tobacco Use  . Smoking status: Former Smoker    Packs/day: 0.50    Years: 19.00    Pack years: 9.50    Types: Cigarettes    Last attempt to quit: 02/22/2000    Years since quitting: 17.8  . Smokeless tobacco: Never Used  Substance and Sexual Activity  . Alcohol use: No    Alcohol/week: 7.2 oz    Types: 12 Glasses of wine per week    Comment: Wife reports chronic alcohol use. 01/12/16  . Drug use: No    Types: Marijuana    Comment: teens to early 20's  . Sexual activity: Not on file  Lifestyle  . Physical activity:    Days per week: Not on file    Minutes per session: Not on file  . Stress: Not on file  Relationships  . Social connections:    Talks on phone: Not on  file    Gets together: Not on file    Attends religious service: Not on file    Active member of club or organization: Not on file    Attends meetings of clubs or organizations: Not on file    Relationship status: Not on file  Other Topics Concern  . Not on file  Social History Narrative  . Not on file   Past Surgical History:  Procedure Laterality Date  . TONSILLECTOMY AND ADENOIDECTOMY Bilateral    Age 47   Past Medical History:  Diagnosis Date  . Hypertension   . Liver damage   . Stroke (HCC)    BP 117/70   Pulse (!) 58   Ht 5\' 11"  (1.803 m)   Wt 147 lb 12.8 oz (67 kg)   SpO2 94%   BMI 20.61 kg/m   Opioid Risk Score:   Fall Risk Score:  `1  Depression screen PHQ 2/9  Depression screen [facility] 2/9 01/01/2018  11/20/2017 10/06/2017 05/19/2016 04/21/2016  Decreased Interest 0 0 0 0 0  Down, Depressed, Hopeless 0 0 0 0 0  PHQ - 2 Score 0 0 0 0 0    Review of Systems  Constitutional: Negative.   HENT: Negative.   Eyes: Negative.   Respiratory: Negative.   Cardiovascular: Negative.   Gastrointestinal: Negative.   Endocrine: Negative.   Genitourinary: Negative.   Musculoskeletal: Negative.   Skin: Negative.   Allergic/Immunologic: Negative.   Neurological: Negative.   Hematological: Negative.   Psychiatric/Behavioral: Negative.   All other systems reviewed and are negative.      Objective:   Physical Exam  Constitutional: He appears well-developed and well-nourished.  HENT:  Head: Normocephalic and atraumatic.  Skin: Skin is warm and dry.  Nursing note and vitals reviewed. Motor strength is 2- at the left deltoid and wrist flexors 0 at the finger flexors and extensors Sensation is absent to light touch and pinprick on the left side upper and lower limb Tone Modified Ashworth score of 1 at the thumb flexor 2 at the fifth digit finger flexor PIP and DIP 2 at the left index finger DIP 1 at the PIP 1+ at the third and fourth digit PIP DIP joints Gait is with a quad cane and left AFO. Left lower extremity strength 3- hip knee extensor synergy       Assessment & Plan:  1.  Chronic left spastic hemiplegia secondary to right basal ganglia hemorrhage.  He does get good relief his spasticity with botulinum toxin total of 200 units.  Would repeat the same dosing and muscle groups selection as last injection in April 2019.  We discussed that we could go up in dosing but this would not lead to any improvement in function.  He does use his finger flexors as a gross grasp. Repeat in 6 weeks

## 2018-01-01 NOTE — Patient Instructions (Signed)
No change in botox dose recommended

## 2018-01-15 ENCOUNTER — Encounter (HOSPITAL_BASED_OUTPATIENT_CLINIC_OR_DEPARTMENT_OTHER): Payer: BLUE CROSS/BLUE SHIELD | Admitting: Psychology

## 2018-02-12 ENCOUNTER — Encounter: Payer: Self-pay | Admitting: Physical Medicine & Rehabilitation

## 2018-02-12 ENCOUNTER — Ambulatory Visit: Payer: Self-pay | Admitting: Psychology

## 2018-02-12 ENCOUNTER — Encounter: Payer: BLUE CROSS/BLUE SHIELD | Attending: Physical Medicine & Rehabilitation

## 2018-02-12 ENCOUNTER — Ambulatory Visit (HOSPITAL_BASED_OUTPATIENT_CLINIC_OR_DEPARTMENT_OTHER): Payer: BLUE CROSS/BLUE SHIELD | Admitting: Physical Medicine & Rehabilitation

## 2018-02-12 VITALS — BP 108/62 | HR 60 | Ht 70.0 in | Wt 151.0 lb

## 2018-02-12 DIAGNOSIS — G8194 Hemiplegia, unspecified affecting left nondominant side: Secondary | ICD-10-CM | POA: Diagnosis not present

## 2018-02-12 DIAGNOSIS — F1010 Alcohol abuse, uncomplicated: Secondary | ICD-10-CM | POA: Insufficient documentation

## 2018-02-12 DIAGNOSIS — I629 Nontraumatic intracranial hemorrhage, unspecified: Secondary | ICD-10-CM | POA: Diagnosis not present

## 2018-02-12 DIAGNOSIS — Z5189 Encounter for other specified aftercare: Secondary | ICD-10-CM | POA: Insufficient documentation

## 2018-02-12 DIAGNOSIS — R252 Cramp and spasm: Secondary | ICD-10-CM | POA: Insufficient documentation

## 2018-02-12 DIAGNOSIS — I10 Essential (primary) hypertension: Secondary | ICD-10-CM | POA: Insufficient documentation

## 2018-02-12 DIAGNOSIS — G8110 Spastic hemiplegia affecting unspecified side: Secondary | ICD-10-CM

## 2018-02-12 DIAGNOSIS — I639 Cerebral infarction, unspecified: Secondary | ICD-10-CM | POA: Diagnosis not present

## 2018-02-12 NOTE — Progress Notes (Signed)
Botox Injection for spasticity using needle EMG guidance  Dilution: 50 Units/ml Indication: Severe spasticity which interferes with ADL,mobility and/or  hygiene and is unresponsive to medication management and other conservative care Informed consent was obtained after describing risks and benefits of the procedure with the patient. This includes bleeding, bruising, infection, excessive weakness, or medication side effects. A REMS form is on file and signed. Needle: 25g 2" needle electrode Number of units per muscle FDS50 FDP 50 FCR 50 FCU 25 FPL 25 All injections were done after obtaining appropriate EMG activity and after negative drawback for blood. The patient tolerated the procedure well. Post procedure instructions were given. A followup appointment was made.

## 2018-02-12 NOTE — Patient Instructions (Signed)

## 2018-03-01 ENCOUNTER — Encounter

## 2018-03-01 ENCOUNTER — Encounter: Payer: BLUE CROSS/BLUE SHIELD | Attending: Physical Medicine & Rehabilitation | Admitting: Psychology

## 2018-03-01 DIAGNOSIS — I629 Nontraumatic intracranial hemorrhage, unspecified: Secondary | ICD-10-CM | POA: Insufficient documentation

## 2018-03-01 DIAGNOSIS — I639 Cerebral infarction, unspecified: Secondary | ICD-10-CM | POA: Diagnosis not present

## 2018-03-01 DIAGNOSIS — I10 Essential (primary) hypertension: Secondary | ICD-10-CM | POA: Insufficient documentation

## 2018-03-01 DIAGNOSIS — R252 Cramp and spasm: Secondary | ICD-10-CM | POA: Insufficient documentation

## 2018-03-01 DIAGNOSIS — I69319 Unspecified symptoms and signs involving cognitive functions following cerebral infarction: Secondary | ICD-10-CM | POA: Diagnosis not present

## 2018-03-01 DIAGNOSIS — G8110 Spastic hemiplegia affecting unspecified side: Secondary | ICD-10-CM

## 2018-03-01 DIAGNOSIS — I610 Nontraumatic intracerebral hemorrhage in hemisphere, subcortical: Secondary | ICD-10-CM

## 2018-03-01 DIAGNOSIS — F1010 Alcohol abuse, uncomplicated: Secondary | ICD-10-CM | POA: Insufficient documentation

## 2018-03-01 DIAGNOSIS — Z5189 Encounter for other specified aftercare: Secondary | ICD-10-CM | POA: Insufficient documentation

## 2018-03-01 DIAGNOSIS — G8194 Hemiplegia, unspecified affecting left nondominant side: Secondary | ICD-10-CM | POA: Diagnosis not present

## 2018-03-05 DIAGNOSIS — I10 Essential (primary) hypertension: Secondary | ICD-10-CM | POA: Diagnosis not present

## 2018-03-05 DIAGNOSIS — E782 Mixed hyperlipidemia: Secondary | ICD-10-CM | POA: Diagnosis not present

## 2018-03-05 DIAGNOSIS — I69391 Dysphagia following cerebral infarction: Secondary | ICD-10-CM | POA: Diagnosis not present

## 2018-03-05 DIAGNOSIS — Z0000 Encounter for general adult medical examination without abnormal findings: Secondary | ICD-10-CM | POA: Diagnosis not present

## 2018-03-05 DIAGNOSIS — R350 Frequency of micturition: Secondary | ICD-10-CM | POA: Diagnosis not present

## 2018-03-27 ENCOUNTER — Encounter: Payer: BLUE CROSS/BLUE SHIELD | Attending: Physical Medicine & Rehabilitation

## 2018-03-27 ENCOUNTER — Ambulatory Visit: Payer: BLUE CROSS/BLUE SHIELD | Admitting: Physical Medicine & Rehabilitation

## 2018-03-27 ENCOUNTER — Encounter: Payer: Self-pay | Admitting: Physical Medicine & Rehabilitation

## 2018-03-27 VITALS — BP 102/64 | HR 66 | Ht 70.0 in | Wt 151.0 lb

## 2018-03-27 DIAGNOSIS — I10 Essential (primary) hypertension: Secondary | ICD-10-CM | POA: Insufficient documentation

## 2018-03-27 DIAGNOSIS — I629 Nontraumatic intracranial hemorrhage, unspecified: Secondary | ICD-10-CM | POA: Insufficient documentation

## 2018-03-27 DIAGNOSIS — G8194 Hemiplegia, unspecified affecting left nondominant side: Secondary | ICD-10-CM | POA: Insufficient documentation

## 2018-03-27 DIAGNOSIS — I69319 Unspecified symptoms and signs involving cognitive functions following cerebral infarction: Secondary | ICD-10-CM | POA: Diagnosis not present

## 2018-03-27 DIAGNOSIS — R252 Cramp and spasm: Secondary | ICD-10-CM | POA: Insufficient documentation

## 2018-03-27 DIAGNOSIS — F1010 Alcohol abuse, uncomplicated: Secondary | ICD-10-CM | POA: Diagnosis not present

## 2018-03-27 DIAGNOSIS — Z5189 Encounter for other specified aftercare: Secondary | ICD-10-CM | POA: Insufficient documentation

## 2018-03-27 DIAGNOSIS — I639 Cerebral infarction, unspecified: Secondary | ICD-10-CM | POA: Diagnosis not present

## 2018-03-27 DIAGNOSIS — G8110 Spastic hemiplegia affecting unspecified side: Secondary | ICD-10-CM

## 2018-03-27 NOTE — Progress Notes (Signed)
Subjective:    Patient ID: Jon [name], male    DOB: 01-Feb-1956, 62 y.o.   MRN: [id number]  HPI  Increase sensation in Left calf after stretching Achilles   Botox 02/12/2018  Left FDS50 FDP 50 FCR 50 FCU 25 FPL 25 Patient feels more limber in the fingers and wrist area after injection on the left side.  pain Inventory Average Pain 0 Pain Right Now 0 My pain is na  In the last 24 hours, has pain interfered with the following? General activity 0 Relation with others 0 Enjoyment of life 0 What TIME of day is your pain at its worst? na Sleep (in general) na  Pain is worse with: na Pain improves with: na Relief from Meds: na  Mobility use a cane ability to climb steps?  no do you drive?  no  Function retired I need assistance with the following:  meal prep, household duties and shopping  Neuro/Psych numbness trouble walking  Prior Studies Any changes since last visit?  no  Physicians involved in your care Any changes since last visit?  no   No family history on file. Social History   Socioeconomic History  . Marital status: Married    Spouse name: Not on file  . Number of children: Not on file  . Years of education: Not on file  . Highest education level: Not on file  Occupational History  . Not on file  Social Needs  . Financial resource strain: Not on file  . Food insecurity:    Worry: Not on file    Inability: Not on file  . Transportation needs:    Medical: Not on file    Non-medical: Not on file  Tobacco Use  . Smoking status: Former Smoker    Packs/day: 0.50    Years: 19.00    Pack years: 9.50    Types: Cigarettes    Last attempt to quit: 02/22/2000    Years since quitting: 18.1  . Smokeless tobacco: Never Used  Substance and Sexual Activity  . Alcohol use: No    Alcohol/week: 12.0 standard drinks    Types: 12 Glasses of wine per week    Comment: Wife reports chronic alcohol use. 01/12/16  . Drug use: No    Types: Marijuana    Comment: teens to early 20's  . Sexual activity: Not on file  Lifestyle  . Physical activity:    Days per week: Not on file    Minutes per session: Not on file  . Stress: Not on file  Relationships  . Social connections:    Talks on phone: Not on file    Gets together: Not on file    Attends religious service: Not on file    Active member of club or organization: Not on file    Attends meetings of clubs or organizations: Not on file    Relationship status: Not on file  Other Topics Concern  . Not on file  Social History Narrative  . Not on file   Past Surgical History:  Procedure Laterality Date  . TONSILLECTOMY AND ADENOIDECTOMY Bilateral    Age 63   Past Medical History:  Diagnosis Date  . Hypertension   . Liver damage   . Stroke (HCC)    BP 102/64   Pulse 66   Ht 5\' 10"  (1.778 m)   Wt 151 lb (68.5 kg)   SpO2 96%   BMI 21.67 kg/m   Opioid Risk Score:  Fall Risk Score:  `1  Depression screen PHQ 2/9  Depression screen [facility] 2/9 01/01/2018 11/20/2017 10/06/2017 05/19/2016 04/21/2016  Decreased Interest 0 0 0 0 0  Down, Depressed, Hopeless 0 0 0 0 0  PHQ - 2 Score 0 0 0 0 0     Review of Systems  Constitutional: Negative.   HENT: Negative.   Eyes: Negative.   Respiratory: Negative.   Cardiovascular: Negative.   Gastrointestinal: Negative.   Endocrine: Negative.   Genitourinary: Negative.   Musculoskeletal: Positive for gait problem.  Skin: Negative.   Allergic/Immunologic: Negative.   Neurological: Positive for numbness.  Hematological: Negative.   Psychiatric/Behavioral: Negative.   All other systems reviewed and are negative.      Objective:   Physical Exam  Constitutional: He is oriented to person, place, and time. He appears well-developed and well-nourished. No distress.  HENT:  Head: Normocephalic and atraumatic.  Eyes: Pupils are equal, round, and reactive to light. EOM are normal.  Musculoskeletal: Normal range of motion.  Neurological:  He is alert and oriented to person, place, and time.  3- shoulder abduction the left side 5/5 strength in the right deltoid bicep tricep grip Left lower limb has 3/5 in the hip flexor 4- at the knee extensor and trace ankle dorsiflexor 5/5 right lower limb Sensation is absent to light touch in the left upper limb and left lower limb. Musculoskeletal exam patient has tenderness over the A1 pulley right index finger.  No palpable nodule.  No evidence of Dupuytren's contracture  Skin: He is not diaphoretic.  Nursing note and vitals reviewed.   2- elbow flexor 0/5 elbow extensor 0/5 at finger and wrist flexor and extensor  0/5 ankle DF      Assessment & Plan:  1.  History of right basal ganglia hemorrhage with left spastic hemiplegia and hemisensory deficits likely due to involvement of the thalamus. He has had good response to Botox injection at the current dosing and muscle groups selection.  We will continue in 6 weeks. 2.  Trigger finger right index mild we discussed stretching and mobilization.  If no better consider injection given that he is not a good candidate for nonsteroidal anti-inflammatories secondary to CVA.  Also may consider OT hand therapy

## 2018-03-27 NOTE — Patient Instructions (Signed)
Dupuytren Contracture Dupuytren contracture is a condition in which tissue under the skin of the palm becomes abnormally thickened. This causes one or more of the fingers to curl inward (contract) toward the palm. Eventually, the fingers may not be able to straighten Trigger Finger Trigger finger (stenosing tenosynovitis) is a condition that causes a finger to get stuck in a bent position. Each finger has a tough, cord-like tissue that connects muscle to bone (tendon), and each tendon is surrounded by a tunnel of tissue (tendon sheath). To move your finger, your tendon needs to slide freely through the sheath. Trigger finger happens when the tendon or the sheath thickens, making it difficult to move your finger. Trigger finger can affect any finger or a thumb. It may affect more than one finger. Mild cases may clear up with rest and medicine. Severe cases require more treatment. What are the causes? Trigger finger is caused by a thickened finger tendon or tendon sheath. The cause of this thickening is not known. What increases the risk? The following factors may make you more likely to develop this condition: Doing activities that require a strong grip. Having rheumatoid arthritis, gout, or diabetes. Being 31-21 years old. Being a woman.  What are the signs or symptoms? Symptoms of this condition include: Pain when bending or straightening your finger. Tenderness or swelling where your finger attaches to the palm of your hand. A lump in the palm of your hand or on the inside of your finger. Hearing a popping sound when you try to straighten your finger. Feeling a popping, catching, or locking sensation when you try to straighten your finger. Being unable to straighten your finger.  How is this diagnosed? This condition is diagnosed based on your symptoms and a physical exam. How is this treated? This condition may be treated by: Resting your finger and avoiding activities that make  symptoms worse. Wearing a finger splint to keep your finger in a slightly bent position. Taking NSAIDs to relieve pain and swelling. Injecting medicine (steroids) into the tendon sheath to reduce swelling and irritation. Injections may need to be repeated. Having surgery to open the tendon sheath. This may be done if other treatments do not work and you cannot straighten your finger. You may need physical therapy after surgery.  Follow these instructions at home: Use moist heat to help reduce pain and swelling as told by your health care provider. Rest your finger and avoid activities that make pain worse. Return to normal activities as told by your health care provider. If you have a splint, wear it as told by your health care provider. Take over-the-counter and prescription medicines only as told by your health care provider. Keep all follow-up visits as told by your health care provider. This is important. Contact a health care provider if: Your symptoms are not improving with home care. Summary Trigger finger (stenosing tenosynovitis) causes your finger to get stuck in a bent position, and it can make it difficult and painful to straighten your finger. This condition develops when a finger tendon or tendon sheath thickens. Treatment starts with resting, wearing a splint, and taking NSAIDs. In severe cases, surgery to open the tendon sheath may be needed. This information is not intended to replace advice given to you by your health care provider. Make sure you discuss any questions you have with your health care provider. Document Released: 04/30/2004 Document Revised: 06/21/2016 Document Reviewed: 06/21/2016 Elsevier Interactive Patient Education  [employer].  out. This condition  affects some or all of the fingers and the palm of the hand. It is often passed along from parent to child (inherited). Dupuytren contracture is a long-term (chronic) condition that develops (progresses)  slowly over time. There is no cure, but symptoms can be managed and progression can be slowed with treatment. This condition is usually not dangerous or painful, but it can interfere with everyday tasks. What are the causes? This condition is caused by tissue (fascia) in the palm getting thicker and tighter. When the fascia thickens, it pulls on the cords of tissue (tendons) that control finger movement. This causes the fingers to contract. The cause of fascia thickening is not known. What increases the risk? This condition may be more likely to develop in:  People who are age 29 or older.  Men.  People with a family history of this condition.  People who use tobacco products, including cigarettes, chewing tobacco, and e-cigarettes.  People who drink alcohol excessively.  People with diabetes.  People with autoimmune diseases, such as HIV.  People with seizure disorders.  What are the signs or symptoms? Symptoms may develop in one or both hands. Any of the fingers can contract. The fingers farthest from the thumb are commonly affected. Usually, this condition is painless. You may have discomfort when holding or grabbing objects. Early symptoms of this condition may include:  Thick, puckered skin on the hand.  One or more lumps (nodules) on the palm. Nodules may be tender when they first appear, but they are generally painless.  Symptoms of this condition develop slowly over months or years. Later symptoms of this condition may include:  Thick cords of tissue in the palm.  Fingers curled up toward the palm.  Inability to straighten the fingers into their normal position.  How is this diagnosed? This condition is diagnosed with a physical exam, which may include:  Looking at your hands and feeling your hands. This is to check for thickened fascia and nodules.  Measuring finger motion.  Doing the [employer]. You may be asked to try to put your hand on a surface,  with your palm down and your fingers straight out.  How is this treated? There is no cure for this condition, but treatment can make symptoms more manageable and relieve discomfort. Treatment options may include:  Physical therapy. This can strengthen your hand and increase flexibility.  Occupational therapy. This can help you with everyday tasks that may be more difficult because of your condition.  A hand splint.  Shots (injections). Substances may be injected into your hand, such as: ? Medicines that help to decrease swelling (corticosteroids). ? Proteins (collagenase) to weaken thick tissue. After a collagenase injection, your health care provider may stretch your fingers.  Needle aponeurotomy. In this procedure, a needle is pushed through the skin and into the fascia. Moving the needle against the fascia can weaken or break up the thick tissue.  Surgery. This may be needed if your condition causes discomfort or interferes with everyday activities. Physical therapy is usually needed after surgery.  In some cases, symptoms never develop to the point of needing major treatment, and caring for yourself at home can be enough to manage your condition. Symptoms often return after treatment. Follow these instructions at home: If you have a splint:  Do not put pressure on any part of the splint until it is fully hardened. This may take several hours.  Wear the splint as told by your health care provider.  Remove it only as told by your health care provider.  Loosen the splint if your fingers tingle, become numb, or turn cold and blue.  Do not let your splint get wet if it is not waterproof. ? If your splint is not waterproof, cover it with a watertight covering when you take a bath or a shower. ? Do not take baths, swim, or use a hot tub until your health care provider approves. Ask your health care provider if you can take showers. You may only be allowed to take sponge baths for  bathing.  Keep the splint clean.  Ask your health care provider when it is safe to drive. Hand Care  Take these actions to help protect your hand from possible injury: ? Use tools that have padded grips. ? Wear protective gloves while you work with your hands. ? Avoid repetitive hand movements.  Avoid actions that cause pain or discomfort.  Stretch your hand by gently pulling your fingers backward toward your wrist. Do this as often as is comfortable. Stop if this causes pain.  Gently massage your hand as often as is comfortable.  If directed, apply heat to the affected area as often as told by your health care provider. Use the heat source that your health care provider recommends, such as a moist heat pack or a heating pad. ? Place a towel between your skin and the heat source. ? Leave the heat on for 20-30 minutes. ? Remove the heat if your skin turns bright red. This is especially important if you are unable to feel pain, heat, or cold. You may have a greater risk of getting burned. General instructions  Take over-the-counter and prescription medicines only as told by your health care provider.  Manage any other conditions that you have, such as diabetes.  If physical therapy was prescribed, do exercises as told by your health care provider.  Keep all follow-up visits as told by your health care provider. This is important. Contact a health care provider if:  You develop new symptoms, or your symptoms get worse.  You have pain that gets worse or does not get better with medicine.  You have difficulty or discomfort with everyday tasks.  You have problems with your splint.  You develop numbness or tingling. Get help right away if:  You have severe pain.  Your fingers change color or become unusually cold. This information is not intended to replace advice given to you by your health care provider. Make sure you discuss any questions you have with your health care  provider. Document Released: 05/08/2009 Document Revised: 08/25/2015 Document Reviewed: 12/03/2014 Elsevier Interactive Patient Education  [employer].

## 2018-04-02 ENCOUNTER — Encounter: Payer: Self-pay | Admitting: Psychology

## 2018-04-02 NOTE — Progress Notes (Signed)
Patient:  Jon [name]   DOB: July 12, 1956  MR Number: [id number]  Location: [facility] PHYSICAL MEDICINE AND REHABILITATION [address] [place], [address] [id number] [facility] [place] [address] Dept: [phone]  Start: 2 PM End: 3 PM  Provider/Observer:     Hershal Coria PsyD  Chief Complaint:      Chief Complaint  Patient presents with  . Fatigue  . Depression    Reason For Service:     Jon [name] presents as a 62 year old history of hypertension alcohol use.  The patient presented on January 08, 2016 with acute onset of headache blood pressure was elevated.  CT of the head showed right basal ganglia hemorrhage with 31 mL volume and 3 mm midline shift.  The patient has not been drinking since his stroke.  He has been attending outpatient physical therapy.  Ongoing issues with spasticity of his left upper extremity greater than left lower extremity.  The patient has had minimal response to baclofen.  The patient was referred by Dr. Wynn Banker for neuropsychological/psychological consultation.  The patient has continued to struggle with the loss of function following his stroke.  The patient reports that he thinks about his functional loss all of the time.  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  The patient has been continuing his art adjusting to his motor deficits the patient has been unable to play the guitar.  He can use his left arm or hand.  He has tried playing the keyboard with just his right hand but this is limited.  The patient reports that he is constantly thinking about playing songs in his head done this before.  However, he reports that now he cannot play them.  He reports that he is suggesting too much of the other issues but does have some cognitive issues related to attention and memory.  The patient reports that he has completely quit drinking alcohol and quit  smoking cigarettes and is lost 50 pounds.  He reports that his mood is okay except when he is around his wife and she starts singing again active and engaged.  The reason for service remains unchanged and the above information remains valid for this appointment.  It has been reviewed for this appointment.  Interventions Strategy:  Cognitive/behavioral therapeutic interventions as well as building coping skills and adaptive abilities following significant CVA.  Participation Level:   Active  Participation Quality:  Appropriate and Attentive      Behavioral Observation:  Well Groomed, Alert, and Appropriate.   Current Psychosocial Factors:  the patient report that he has been working on spending more time around other people and feels like he is feeling more comfortable and not embarrassed about his ongoing residual effects of his stroke.  Content of Session:   Reviewed current symptoms and continue to work on therapeutic interventions around issues of coping skills and adjustment skills.  Current Status:   The patient continues to report that difficulties with motivation and significant motor deficits continue to be quite problematic for him.  The patient reports that he has always wanted to be outside working on his yard and doing other "tinkering" around the house.  The patient reports that this is very difficult for him to do at this point.  Patient Progress:   The patient continues to actively work on his therapeutic and coping skills and we are continuing to  work on further development in his expansion of his life activities including aspects of his art work.  The patient reports that he is continued to have more interactions with others and use of coping skills continue to improve.   Last Reviewed:   03/01/2018  Impression/Diagnosis:   The patient continues to have a lot of frustration with loss of function following his right basal ganglia hemorrhage and this is producing negative  emotional response and frustrations.  The patient is having difficulties with motivation etc.  Diagnosis:   Spastic hemiplegia affecting dominant side (HCC)  Cognitive deficit, post-stroke  Basal ganglia hemorrhage (HCC)

## 2018-05-04 ENCOUNTER — Other Ambulatory Visit: Payer: Self-pay | Admitting: Physical Medicine & Rehabilitation

## 2018-05-08 ENCOUNTER — Ambulatory Visit: Payer: Self-pay | Admitting: Physical Medicine & Rehabilitation

## 2018-05-14 ENCOUNTER — Encounter: Payer: BLUE CROSS/BLUE SHIELD | Attending: Physical Medicine & Rehabilitation

## 2018-05-14 ENCOUNTER — Encounter: Payer: Self-pay | Admitting: Physical Medicine & Rehabilitation

## 2018-05-14 ENCOUNTER — Ambulatory Visit (HOSPITAL_BASED_OUTPATIENT_CLINIC_OR_DEPARTMENT_OTHER): Payer: BLUE CROSS/BLUE SHIELD | Admitting: Physical Medicine & Rehabilitation

## 2018-05-14 VITALS — BP 120/67 | HR 75 | Resp 14 | Ht 70.0 in | Wt 151.0 lb

## 2018-05-14 DIAGNOSIS — I10 Essential (primary) hypertension: Secondary | ICD-10-CM | POA: Diagnosis not present

## 2018-05-14 DIAGNOSIS — I629 Nontraumatic intracranial hemorrhage, unspecified: Secondary | ICD-10-CM | POA: Diagnosis not present

## 2018-05-14 DIAGNOSIS — F1010 Alcohol abuse, uncomplicated: Secondary | ICD-10-CM | POA: Diagnosis not present

## 2018-05-14 DIAGNOSIS — R252 Cramp and spasm: Secondary | ICD-10-CM | POA: Insufficient documentation

## 2018-05-14 DIAGNOSIS — G8110 Spastic hemiplegia affecting unspecified side: Secondary | ICD-10-CM

## 2018-05-14 DIAGNOSIS — G8194 Hemiplegia, unspecified affecting left nondominant side: Secondary | ICD-10-CM | POA: Diagnosis not present

## 2018-05-14 DIAGNOSIS — Z5189 Encounter for other specified aftercare: Secondary | ICD-10-CM | POA: Diagnosis not present

## 2018-05-14 DIAGNOSIS — I639 Cerebral infarction, unspecified: Secondary | ICD-10-CM | POA: Diagnosis not present

## 2018-05-14 NOTE — Progress Notes (Signed)
Botox Injection for spasticity using needle EMG guidance  Dilution: 50 Units/ml Indication: Severe spasticity which interferes with ADL,mobility and/or  hygiene and is unresponsive to medication management and other conservative care Informed consent was obtained after describing risks and benefits of the procedure with the patient. This includes bleeding, bruising, infection, excessive weakness, or medication side effects. A REMS form is on file and signed. Needle: 25g 2" needle electrode Number of units per muscle FDS50 FDP 50 FCR 50 FCU 25 FPL 25 All injections were done after obtaining appropriate EMG activity and after negative drawback for blood. The patient tolerated the procedure well. Post procedure instructions were given. A followup appointment was made.  

## 2018-05-14 NOTE — Patient Instructions (Signed)

## 2018-06-25 ENCOUNTER — Encounter: Payer: Medicare Other | Attending: Physical Medicine & Rehabilitation

## 2018-06-25 ENCOUNTER — Ambulatory Visit: Payer: Self-pay | Admitting: Physical Medicine & Rehabilitation

## 2018-06-25 DIAGNOSIS — R252 Cramp and spasm: Secondary | ICD-10-CM | POA: Insufficient documentation

## 2018-06-25 DIAGNOSIS — I10 Essential (primary) hypertension: Secondary | ICD-10-CM | POA: Insufficient documentation

## 2018-06-25 DIAGNOSIS — I629 Nontraumatic intracranial hemorrhage, unspecified: Secondary | ICD-10-CM | POA: Insufficient documentation

## 2018-06-25 DIAGNOSIS — G8194 Hemiplegia, unspecified affecting left nondominant side: Secondary | ICD-10-CM | POA: Insufficient documentation

## 2018-06-25 DIAGNOSIS — F1010 Alcohol abuse, uncomplicated: Secondary | ICD-10-CM | POA: Insufficient documentation

## 2018-06-25 DIAGNOSIS — Z5189 Encounter for other specified aftercare: Secondary | ICD-10-CM | POA: Insufficient documentation

## 2018-06-26 DIAGNOSIS — L738 Other specified follicular disorders: Secondary | ICD-10-CM | POA: Diagnosis not present

## 2018-06-26 DIAGNOSIS — D692 Other nonthrombocytopenic purpura: Secondary | ICD-10-CM | POA: Diagnosis not present

## 2018-06-26 DIAGNOSIS — L814 Other melanin hyperpigmentation: Secondary | ICD-10-CM | POA: Diagnosis not present

## 2018-06-26 DIAGNOSIS — L821 Other seborrheic keratosis: Secondary | ICD-10-CM | POA: Diagnosis not present

## 2018-06-28 ENCOUNTER — Ambulatory Visit (HOSPITAL_BASED_OUTPATIENT_CLINIC_OR_DEPARTMENT_OTHER): Payer: Medicare Other | Admitting: Physical Medicine & Rehabilitation

## 2018-06-28 ENCOUNTER — Encounter: Payer: Self-pay | Admitting: Physical Medicine & Rehabilitation

## 2018-06-28 VITALS — BP 145/67 | HR 63 | Ht 70.0 in | Wt 151.0 lb

## 2018-06-28 DIAGNOSIS — G8110 Spastic hemiplegia affecting unspecified side: Secondary | ICD-10-CM

## 2018-06-28 DIAGNOSIS — I10 Essential (primary) hypertension: Secondary | ICD-10-CM | POA: Diagnosis not present

## 2018-06-28 DIAGNOSIS — G8194 Hemiplegia, unspecified affecting left nondominant side: Secondary | ICD-10-CM | POA: Diagnosis not present

## 2018-06-28 DIAGNOSIS — Z5189 Encounter for other specified aftercare: Secondary | ICD-10-CM | POA: Diagnosis not present

## 2018-06-28 DIAGNOSIS — I629 Nontraumatic intracranial hemorrhage, unspecified: Secondary | ICD-10-CM | POA: Diagnosis not present

## 2018-06-28 DIAGNOSIS — I639 Cerebral infarction, unspecified: Secondary | ICD-10-CM | POA: Diagnosis present

## 2018-06-28 DIAGNOSIS — R252 Cramp and spasm: Secondary | ICD-10-CM | POA: Diagnosis not present

## 2018-06-28 DIAGNOSIS — F1010 Alcohol abuse, uncomplicated: Secondary | ICD-10-CM | POA: Diagnosis not present

## 2018-06-28 NOTE — Progress Notes (Signed)
Subjective:    Patient ID: Jon [name], male    DOB: 1955-07-28, 62 y.o.   MRN: [id number]  HPI 62 year old male with history of right basal ganglia hemorrhage in 2015 with resultant chronic left spastic hemiplegia, left hemisensory deficits and  mild cognitive deficits He has not been able to return to work or to driving since his stroke, his wife assist him with dressing bathing.  He is able to ambulate with a hemiwalker short distances but uses a wheelchair in the community Baclofen 20mg  QID FDS50 FDP 50 FCR 50 FCU 25 FPL 25 Pain Inventory Average Pain 0 Pain Right Now 0 My pain is no pain  In the last 24 hours, has pain interfered with the following? General activity 0 Relation with others 0 Enjoyment of life 0 What TIME of day is your pain at its worst?  pain Sleep (in general) Good  Pain is worse with: no pain Pain improves with: no pain Relief from Meds: no pain  Mobility walk with assistance use a cane ability to climb steps?  yes do you drive?  no use a wheelchair Do you have any goals in this area?  yes  Function not employed: date last employed . I need assistance with the following:  household duties  Neuro/Psych bladder control problems trouble walking  Prior Studies Any changes since last visit?  no  Physicians involved in your care Any changes since last visit?  no   No family history on file. Social History   Socioeconomic History  . Marital status: Married    Spouse name: Not on file  . Number of children: Not on file  . Years of education: Not on file  . Highest education level: Not on file  Occupational History  . Not on file  Social Needs  . Financial resource strain: Not on file  . Food insecurity:    Worry: Not on file    Inability: Not on file  . Transportation needs:    Medical: Not on file    Non-medical: Not on file  Tobacco Use  . Smoking status: Former Smoker    Packs/day: 0.50    Years: 19.00    Pack years:  9.50    Types: Cigarettes    Last attempt to quit: 02/22/2000    Years since quitting: 18.3  . Smokeless tobacco: Never Used  Substance and Sexual Activity  . Alcohol use: No    Alcohol/week: 12.0 standard drinks    Types: 12 Glasses of wine per week    Comment: Wife reports chronic alcohol use. 01/12/16  . Drug use: No    Types: Marijuana    Comment: teens to early 20's  . Sexual activity: Not on file  Lifestyle  . Physical activity:    Days per week: Not on file    Minutes per session: Not on file  . Stress: Not on file  Relationships  . Social connections:    Talks on phone: Not on file    Gets together: Not on file    Attends religious service: Not on file    Active member of club or organization: Not on file    Attends meetings of clubs or organizations: Not on file    Relationship status: Not on file  Other Topics Concern  . Not on file  Social History Narrative  . Not on file   Past Surgical History:  Procedure Laterality Date  . TONSILLECTOMY AND ADENOIDECTOMY Bilateral    Age  6   Past Medical History:  Diagnosis Date  . Hypertension   . Liver damage   . Stroke [facility]    There were no vitals taken for this visit.  Opioid Risk Score:   Fall Risk Score:  `1  Depression screen PHQ 2/9  Depression screen [facility] 2/9 01/01/2018 11/20/2017 10/06/2017 05/19/2016 04/21/2016  Decreased Interest 0 0 0 0 0  Down, Depressed, Hopeless 0 0 0 0 0  PHQ - 2 Score 0 0 0 0 0    Review of Systems  Constitutional: Negative.   HENT: Negative.   Eyes: Negative.   Respiratory: Negative.   Cardiovascular: Negative.   Gastrointestinal: Negative.   Endocrine: Negative.   Genitourinary: Positive for difficulty urinating.  Musculoskeletal: Positive for gait problem.  Skin: Negative.   Allergic/Immunologic: Negative.   Hematological: Negative.   Psychiatric/Behavioral: Negative.   All other systems reviewed and are negative.      Objective:   Physical Exam  Constitutional:  He is oriented to person, place, and time. He appears well-developed and well-nourished. No distress.  HENT:  Head: Normocephalic and atraumatic.  Eyes: Pupils are equal, round, and reactive to light. EOM are normal.  Neurological: He is alert and oriented to person, place, and time.  Skin: Skin is warm and dry. He is not diaphoretic.  Nursing note and vitals reviewed.  Motor strength is 0 at the left ankle dorsiflexor plantar flexor 4 at the left hip flexor knee extensor to minus at the left elbow flexor and deltoid 0 at the finger flexors extensors wrist flexors and extensors Tone modified Ashworth score 3 in the finger flexors and wrist flexors 1 at the elbow flexors, 1 in the thumb flexors       Assessment & Plan:  #1.  Right basal ganglia hemorrhage onset 12/29/2015.  He has resultant left spastic hemiplegia and has responded well to botulinum toxin injections.  Would continue with current dosing and muscle group selection listed above.  We will repeat in 6 weeks.  Given that he has had a stable dose and muscle group selection we may be able to schedule every 41-month injections without 6-week follow-up. We also discussed his stroke as it pertains to sensation, we also discussed newer treatments such as stem cell which in some cases is experimental, we discussed that IV stem cell is unable to target specific areas of the brain Over half of the 25 min visit was spent counseling and coordinating care.

## 2018-08-05 ENCOUNTER — Other Ambulatory Visit: Payer: Self-pay | Admitting: Physical Medicine & Rehabilitation

## 2018-08-09 ENCOUNTER — Ambulatory Visit: Payer: Self-pay | Admitting: Physical Medicine & Rehabilitation

## 2018-08-16 ENCOUNTER — Ambulatory Visit: Payer: Self-pay | Admitting: Physical Medicine & Rehabilitation

## 2018-08-17 ENCOUNTER — Encounter: Payer: Medicare Other | Attending: Physical Medicine & Rehabilitation

## 2018-08-17 ENCOUNTER — Encounter: Payer: Self-pay | Admitting: Physical Medicine & Rehabilitation

## 2018-08-17 ENCOUNTER — Ambulatory Visit: Payer: Medicare Other | Admitting: Physical Medicine & Rehabilitation

## 2018-08-17 VITALS — BP 116/71 | HR 65 | Resp 14

## 2018-08-17 DIAGNOSIS — I10 Essential (primary) hypertension: Secondary | ICD-10-CM | POA: Insufficient documentation

## 2018-08-17 DIAGNOSIS — Z5189 Encounter for other specified aftercare: Secondary | ICD-10-CM | POA: Diagnosis not present

## 2018-08-17 DIAGNOSIS — G8110 Spastic hemiplegia affecting unspecified side: Secondary | ICD-10-CM | POA: Diagnosis not present

## 2018-08-17 DIAGNOSIS — R252 Cramp and spasm: Secondary | ICD-10-CM | POA: Insufficient documentation

## 2018-08-17 DIAGNOSIS — I629 Nontraumatic intracranial hemorrhage, unspecified: Secondary | ICD-10-CM | POA: Insufficient documentation

## 2018-08-17 DIAGNOSIS — G8194 Hemiplegia, unspecified affecting left nondominant side: Secondary | ICD-10-CM | POA: Diagnosis not present

## 2018-08-17 DIAGNOSIS — I639 Cerebral infarction, unspecified: Secondary | ICD-10-CM | POA: Diagnosis present

## 2018-08-17 DIAGNOSIS — F1010 Alcohol abuse, uncomplicated: Secondary | ICD-10-CM | POA: Insufficient documentation

## 2018-08-17 NOTE — Progress Notes (Signed)
Botox Injection for spasticity using needle EMG guidance  Dilution: 50 Units/ml Indication: Severe spasticity which interferes with ADL,mobility and/or  hygiene and is unresponsive to medication management and other conservative care Informed consent was obtained after describing risks and benefits of the procedure with the patient. This includes bleeding, bruising, infection, excessive weakness, or medication side effects. A REMS form is on file and signed. Needle: 25g 2" needle electrode Number of units per muscle  LEFT  FDS50 FDP 50 FCR 50 FCU 25 FPL 25 All injections were done after obtaining appropriate EMG activity and after negative drawback for blood. The patient tolerated the procedure well. Post procedure instructions were given. A followup appointment was made.

## 2018-09-06 DIAGNOSIS — E782 Mixed hyperlipidemia: Secondary | ICD-10-CM | POA: Diagnosis not present

## 2018-09-06 DIAGNOSIS — I10 Essential (primary) hypertension: Secondary | ICD-10-CM | POA: Diagnosis not present

## 2018-09-06 DIAGNOSIS — I69359 Hemiplegia and hemiparesis following cerebral infarction affecting unspecified side: Secondary | ICD-10-CM | POA: Diagnosis not present

## 2018-09-06 DIAGNOSIS — N400 Enlarged prostate without lower urinary tract symptoms: Secondary | ICD-10-CM | POA: Diagnosis not present

## 2018-09-25 DIAGNOSIS — K580 Irritable bowel syndrome with diarrhea: Secondary | ICD-10-CM | POA: Diagnosis not present

## 2018-09-25 DIAGNOSIS — Z1211 Encounter for screening for malignant neoplasm of colon: Secondary | ICD-10-CM | POA: Diagnosis not present

## 2018-09-25 DIAGNOSIS — I69351 Hemiplegia and hemiparesis following cerebral infarction affecting right dominant side: Secondary | ICD-10-CM | POA: Diagnosis not present

## 2018-09-25 DIAGNOSIS — N400 Enlarged prostate without lower urinary tract symptoms: Secondary | ICD-10-CM | POA: Diagnosis not present

## 2018-10-03 DIAGNOSIS — Z1211 Encounter for screening for malignant neoplasm of colon: Secondary | ICD-10-CM | POA: Diagnosis not present

## 2018-10-03 DIAGNOSIS — Z1212 Encounter for screening for malignant neoplasm of rectum: Secondary | ICD-10-CM | POA: Diagnosis not present

## 2018-10-04 ENCOUNTER — Ambulatory Visit: Payer: Self-pay | Admitting: Physical Medicine & Rehabilitation

## 2018-10-09 ENCOUNTER — Ambulatory Visit: Payer: Medicare Other | Admitting: Physical Medicine & Rehabilitation

## 2018-10-09 ENCOUNTER — Encounter: Payer: Medicare Other | Attending: Physical Medicine & Rehabilitation

## 2018-10-09 ENCOUNTER — Other Ambulatory Visit: Payer: Self-pay

## 2018-10-09 VITALS — BP 119/73 | HR 67 | Ht 70.0 in | Wt 146.0 lb

## 2018-10-09 DIAGNOSIS — Z5189 Encounter for other specified aftercare: Secondary | ICD-10-CM | POA: Diagnosis not present

## 2018-10-09 DIAGNOSIS — R252 Cramp and spasm: Secondary | ICD-10-CM | POA: Diagnosis not present

## 2018-10-09 DIAGNOSIS — I629 Nontraumatic intracranial hemorrhage, unspecified: Secondary | ICD-10-CM | POA: Insufficient documentation

## 2018-10-09 DIAGNOSIS — G8194 Hemiplegia, unspecified affecting left nondominant side: Secondary | ICD-10-CM | POA: Insufficient documentation

## 2018-10-09 DIAGNOSIS — G8110 Spastic hemiplegia affecting unspecified side: Secondary | ICD-10-CM | POA: Diagnosis not present

## 2018-10-09 DIAGNOSIS — F1010 Alcohol abuse, uncomplicated: Secondary | ICD-10-CM | POA: Diagnosis not present

## 2018-10-09 DIAGNOSIS — I10 Essential (primary) hypertension: Secondary | ICD-10-CM | POA: Insufficient documentation

## 2018-10-09 DIAGNOSIS — I639 Cerebral infarction, unspecified: Secondary | ICD-10-CM | POA: Diagnosis present

## 2018-10-09 NOTE — Patient Instructions (Signed)
Work on hip flexor exercise

## 2018-10-09 NOTE — Progress Notes (Signed)
Subjective:    Patient ID: Jon [name], male    DOB: 11/22/1955, 63 y.o.   MRN: [id number]  HPI   Discussed the effect of his Botox which patient feels has been helpful.  He also discussed that this may be repeated milliliter and that over time he may require smaller doses 08/17/2017 LEFT  FDS50 FDP 50 FCR 50 FCU 25 FPL 25 Pain Inventory Average Pain 0 Pain Right Now 0 My pain is na  In the last 24 hours, has pain interfered with the following? General activity 0 Relation with others 0 Enjoyment of life 0 What TIME of day is your pain at its worst? na Sleep (in general) Good  Pain is worse with: na Pain improves with: na Relief from Meds: na  Mobility walk with assistance use a cane how many minutes can you walk? 10 ability to climb steps?  no do you drive?  no  Function disabled: date disabled na I need assistance with the following:  meal prep, household duties and shopping  Neuro/Psych trouble walking  Prior Studies Any changes since last visit?  no  Physicians involved in your care Any changes since last visit?  no   No family history on file. Social History   Socioeconomic History  . Marital status: Married    Spouse name: Not on file  . Number of children: Not on file  . Years of education: Not on file  . Highest education level: Not on file  Occupational History  . Not on file  Social Needs  . Financial resource strain: Not on file  . Food insecurity:    Worry: Not on file    Inability: Not on file  . Transportation needs:    Medical: Not on file    Non-medical: Not on file  Tobacco Use  . Smoking status: Former Smoker    Packs/day: 0.50    Years: 19.00    Pack years: 9.50    Types: Cigarettes    Last attempt to quit: 02/22/2000    Years since quitting: 18.6  . Smokeless tobacco: Never Used  Substance and Sexual Activity  . Alcohol use: No    Alcohol/week: 12.0 standard drinks    Types: 12 Glasses of wine per week   Comment: Wife reports chronic alcohol use. 01/12/16  . Drug use: No    Types: Marijuana    Comment: teens to early 20's  . Sexual activity: Not on file  Lifestyle  . Physical activity:    Days per week: Not on file    Minutes per session: Not on file  . Stress: Not on file  Relationships  . Social connections:    Talks on phone: Not on file    Gets together: Not on file    Attends religious service: Not on file    Active member of club or organization: Not on file    Attends meetings of clubs or organizations: Not on file    Relationship status: Not on file  Other Topics Concern  . Not on file  Social History Narrative  . Not on file   Past Surgical History:  Procedure Laterality Date  . TONSILLECTOMY AND ADENOIDECTOMY Bilateral    Age 42   Past Medical History:  Diagnosis Date  . Hypertension   . Liver damage   . Stroke (HCC)    BP 119/73   Pulse 67   Ht 5\' 10"  (1.778 m)   Wt 146 lb (66.2 kg)  SpO2 95%   BMI 20.95 kg/m   Opioid Risk Score:   Fall Risk Score:  `1  Depression screen PHQ 2/9  Depression screen [facility] 2/9 01/01/2018 11/20/2017 10/06/2017 05/19/2016 04/21/2016  Decreased Interest 0 0 0 0 0  Down, Depressed, Hopeless 0 0 0 0 0  PHQ - 2 Score 0 0 0 0 0    Review of Systems     Objective:   Physical Exam Vitals signs and nursing note reviewed.  Constitutional:      Appearance: Normal appearance.  HENT:     Head: Normocephalic and atraumatic.     Mouth/Throat:     Mouth: Mucous membranes are moist.     Pharynx: Oropharynx is clear.  Eyes:     Conjunctiva/sclera: Conjunctivae normal.     Pupils: Pupils are equal, round, and reactive to light.  Musculoskeletal:     Left shoulder: He exhibits decreased range of motion.     Comments: Pain with Left shoulder ext rotation + ant/inf Left shoulder subluxation  Neurological:     General: No focal deficit present.     Mental Status: He is alert and oriented to person, place, and time.     Comments:  Motor strength is to minus in the left deltoid 3- in the biceps trace tricep 0 finger flexors and extensors. Left lower extremity 4/5 in the hip flexor knee extensor 3- ankle dorsiflexor Ambulates with a quad cane he has evidence of foot drag but no knee instability. Tone MAS 2 at the finger flexors of the left hand MAS 1-2 in the thumb flexor left hand MAS 1 in the wrist flexor of the left hand.  MAS 1 at the biceps  Psychiatric:        Mood and Affect: Mood normal.           Assessment & Plan:  1.  History of right subcortical hemorrhage I do not expect any continued significant improvement in motor function.  His awareness of the left-sided deficits may continue to improve and he may compensate functionally over time. The botulinum toxin injection has been helpful in reducing his left upper extremity tone but as discussed with patient this will not improve his motor function. We will repeat same muscle group selection and dosing in 6 weeks. We discussed weightbearing in his elbow.  We also discussed proper positioning of the shoulder.  We discussed possibility of shoulder injections for pain which he declines at the current time.

## 2018-10-21 ENCOUNTER — Other Ambulatory Visit: Payer: Self-pay | Admitting: Physical Medicine & Rehabilitation

## 2018-10-25 ENCOUNTER — Other Ambulatory Visit: Payer: Self-pay | Admitting: Physical Medicine & Rehabilitation

## 2018-11-20 ENCOUNTER — Encounter: Payer: Medicare Other | Attending: Physical Medicine & Rehabilitation

## 2018-11-20 ENCOUNTER — Ambulatory Visit: Payer: Medicare Other | Admitting: Physical Medicine & Rehabilitation

## 2018-11-20 ENCOUNTER — Encounter: Payer: Self-pay | Admitting: Physical Medicine & Rehabilitation

## 2018-11-20 ENCOUNTER — Other Ambulatory Visit: Payer: Self-pay

## 2018-11-20 VITALS — BP 129/73 | HR 70 | Temp 97.7°F | Resp 16 | Ht 71.0 in | Wt 150.0 lb

## 2018-11-20 DIAGNOSIS — I629 Nontraumatic intracranial hemorrhage, unspecified: Secondary | ICD-10-CM | POA: Diagnosis not present

## 2018-11-20 DIAGNOSIS — R252 Cramp and spasm: Secondary | ICD-10-CM | POA: Insufficient documentation

## 2018-11-20 DIAGNOSIS — Z5189 Encounter for other specified aftercare: Secondary | ICD-10-CM | POA: Insufficient documentation

## 2018-11-20 DIAGNOSIS — F1010 Alcohol abuse, uncomplicated: Secondary | ICD-10-CM | POA: Insufficient documentation

## 2018-11-20 DIAGNOSIS — G8194 Hemiplegia, unspecified affecting left nondominant side: Secondary | ICD-10-CM | POA: Diagnosis not present

## 2018-11-20 DIAGNOSIS — G8110 Spastic hemiplegia affecting unspecified side: Secondary | ICD-10-CM | POA: Diagnosis not present

## 2018-11-20 DIAGNOSIS — I639 Cerebral infarction, unspecified: Secondary | ICD-10-CM | POA: Diagnosis present

## 2018-11-20 DIAGNOSIS — I10 Essential (primary) hypertension: Secondary | ICD-10-CM | POA: Diagnosis not present

## 2018-11-20 DIAGNOSIS — I69359 Hemiplegia and hemiparesis following cerebral infarction affecting unspecified side: Secondary | ICD-10-CM

## 2018-11-20 NOTE — Patient Instructions (Signed)

## 2018-11-20 NOTE — Progress Notes (Signed)
Botox Injection for spasticity using needle EMG guidance  Dilution: 50 Units/ml Indication: Severe spasticity which interferes with ADL,mobility and/or  hygiene and is unresponsive to medication management and other conservative care Informed consent was obtained after describing risks and benefits of the procedure with the patient. This includes bleeding, bruising, infection, excessive weakness, or medication side effects. A REMS form is on file and signed. Needle: 25g 2" needle electrode Number of units per muscle  LEFT dominant  FDS50 FDP 50 FCR 50  FCU 25 FPL 25 All injections were done after obtaining appropriate EMG activity and after negative drawback for blood. The patient tolerated the procedure well. Post procedure instructions were given. A followup appointment was made.  

## 2019-01-01 ENCOUNTER — Encounter: Payer: Self-pay | Admitting: Physical Medicine & Rehabilitation

## 2019-01-01 ENCOUNTER — Encounter: Payer: Medicare Other | Attending: Physical Medicine & Rehabilitation | Admitting: Physical Medicine & Rehabilitation

## 2019-01-01 ENCOUNTER — Other Ambulatory Visit: Payer: Self-pay

## 2019-01-01 VITALS — BP 105/65 | HR 63 | Temp 98.3°F | Ht 70.0 in | Wt 159.0 lb

## 2019-01-01 DIAGNOSIS — R269 Unspecified abnormalities of gait and mobility: Secondary | ICD-10-CM

## 2019-01-01 DIAGNOSIS — I10 Essential (primary) hypertension: Secondary | ICD-10-CM | POA: Diagnosis not present

## 2019-01-01 DIAGNOSIS — I629 Nontraumatic intracranial hemorrhage, unspecified: Secondary | ICD-10-CM | POA: Diagnosis not present

## 2019-01-01 DIAGNOSIS — R252 Cramp and spasm: Secondary | ICD-10-CM | POA: Insufficient documentation

## 2019-01-01 DIAGNOSIS — G8194 Hemiplegia, unspecified affecting left nondominant side: Secondary | ICD-10-CM | POA: Diagnosis not present

## 2019-01-01 DIAGNOSIS — I69398 Other sequelae of cerebral infarction: Secondary | ICD-10-CM

## 2019-01-01 DIAGNOSIS — F1010 Alcohol abuse, uncomplicated: Secondary | ICD-10-CM | POA: Insufficient documentation

## 2019-01-01 DIAGNOSIS — Z5189 Encounter for other specified aftercare: Secondary | ICD-10-CM | POA: Insufficient documentation

## 2019-01-01 DIAGNOSIS — I610 Nontraumatic intracerebral hemorrhage in hemisphere, subcortical: Secondary | ICD-10-CM

## 2019-01-01 DIAGNOSIS — I639 Cerebral infarction, unspecified: Secondary | ICD-10-CM | POA: Diagnosis present

## 2019-01-01 DIAGNOSIS — G8110 Spastic hemiplegia affecting unspecified side: Secondary | ICD-10-CM | POA: Diagnosis not present

## 2019-01-01 NOTE — Patient Instructions (Signed)
Botox next visit  

## 2019-01-01 NOTE — Progress Notes (Signed)
Subjective:    Patient ID: Jon [name], male    DOB: 03/17/1956, 63 y.o.   MRN: [id number]  HPI  FDS50 FDP 50 FCR 50  FCU 25 FPL 25 Pain Inventory Average Pain 0 Pain Right Now 0 My pain is no pain  In the last 24 hours, has pain interfered with the following? General activity 0 Relation with others 0 Enjoyment of life 0 What TIME of day is your pain at its worst? no pain Sleep (in general) Good  Pain is worse with: no pain Pain improves with: no pain Relief from Meds: no pain  Mobility walk with assistance use a cane how many minutes can you walk? 20 ability to climb steps?  yes do you drive?  yes use a wheelchair  Function disabled: date disabled 12/2017 retired  Neuro/Psych bladder control problems trouble walking  Prior Studies Any changes since last visit?  no  Physicians involved in your care Any changes since last visit?  no   No family history on file. Social History   Socioeconomic History  . Marital status: Married    Spouse name: Not on file  . Number of children: Not on file  . Years of education: Not on file  . Highest education level: Not on file  Occupational History  . Not on file  Social Needs  . Financial resource strain: Not on file  . Food insecurity:    Worry: Not on file    Inability: Not on file  . Transportation needs:    Medical: Not on file    Non-medical: Not on file  Tobacco Use  . Smoking status: Former Smoker    Packs/day: 0.50    Years: 19.00    Pack years: 9.50    Types: Cigarettes    Last attempt to quit: 02/22/2000    Years since quitting: 18.8  . Smokeless tobacco: Never Used  Substance and Sexual Activity  . Alcohol use: No    Alcohol/week: 12.0 standard drinks    Types: 12 Glasses of wine per week    Comment: Wife reports chronic alcohol use. 01/12/16  . Drug use: No    Types: Marijuana    Comment: teens to early 20's  . Sexual activity: Not on file  Lifestyle  . Physical activity:    Days  per week: Not on file    Minutes per session: Not on file  . Stress: Not on file  Relationships  . Social connections:    Talks on phone: Not on file    Gets together: Not on file    Attends religious service: Not on file    Active member of club or organization: Not on file    Attends meetings of clubs or organizations: Not on file    Relationship status: Not on file  Other Topics Concern  . Not on file  Social History Narrative  . Not on file   Past Surgical History:  Procedure Laterality Date  . TONSILLECTOMY AND ADENOIDECTOMY Bilateral    Age 76   Past Medical History:  Diagnosis Date  . Hypertension   . Liver damage   . Stroke (HCC)    BP 105/65   Pulse 63   Temp 98.3 F (36.8 C)   Ht 5\' 10"  (1.778 m)   Wt 159 lb (72.1 kg)   SpO2 94%   BMI 22.81 kg/m   Opioid Risk Score:   Fall Risk Score:  `1  Depression screen [facility] 2/9  [facility] 2/[facility] 01/01/2019 01/01/2018 11/20/2017 10/06/2017 05/19/2016 04/21/2016  Decreased Interest 0 0 0 0 0 0  Down, Depressed, Hopeless 0 0 0 0 0 0  PHQ - 2 Score 0 0 0 0 0 0    Review of Systems  Constitutional: Negative.   HENT: Negative.   Eyes: Negative.   Respiratory: Negative.   Cardiovascular: Negative.   Gastrointestinal: Negative.   Endocrine: Negative.   Genitourinary: Negative.   Musculoskeletal: Negative.   Skin: Negative.   Allergic/Immunologic: Negative.   Neurological: Negative.   Hematological: Negative.   Psychiatric/Behavioral: Negative.   All other systems reviewed and are negative.      Objective:   Physical Exam Vitals signs and nursing note reviewed.  Constitutional:      Appearance: Normal appearance. He is normal weight.  HENT:     Head: Normocephalic and atraumatic.  Eyes:     Extraocular Movements: Extraocular movements intact.     Conjunctiva/sclera: Conjunctivae normal.     Pupils: Pupils are equal, round, and reactive to light.  Neurological:     Mental Status: He is alert and  oriented to person, place, and time.  Psychiatric:        Mood and Affect: Mood normal.        Behavior: Behavior normal.    Motor strength is 3- shoulder abduction using triceps as a substitution Biceps 2- triceps to minus finger flexors to minus finger extensors 0 Tone is MAS 2 at the elbow flexors wrist flexors and finger flexors as well as thumb flexor.       Assessment & Plan:  1.  History of right subcortical intracranial hemorrhage with chronic left spastic hemiplegia.  Has achieved an overall supervision level of assistance for ADLs and mobility. His spasticity is well controlled at this point he no longer has this fingernails digging into the palm of his hand.  He does want some residual tone in his finger flexors because he grasps objects such as a toothbrush so he can squeeze toothpaste onto it.  We discussed that the current dosing of Botox still allows him to perform gross grasp.  Planned for next injection in 6 weeks FDS50 FDP 50 FCR 50  FCU 25 FPL 25

## 2019-01-27 ENCOUNTER — Other Ambulatory Visit: Payer: Self-pay | Admitting: Physical Medicine & Rehabilitation

## 2019-02-21 ENCOUNTER — Other Ambulatory Visit: Payer: Self-pay

## 2019-02-21 ENCOUNTER — Encounter: Payer: Medicare Other | Attending: Physical Medicine & Rehabilitation | Admitting: Physical Medicine & Rehabilitation

## 2019-02-21 VITALS — BP 107/61 | HR 65 | Temp 98.7°F | Ht 71.0 in | Wt 150.0 lb

## 2019-02-21 DIAGNOSIS — I639 Cerebral infarction, unspecified: Secondary | ICD-10-CM | POA: Diagnosis present

## 2019-02-21 DIAGNOSIS — G8110 Spastic hemiplegia affecting unspecified side: Secondary | ICD-10-CM

## 2019-02-21 DIAGNOSIS — R252 Cramp and spasm: Secondary | ICD-10-CM | POA: Diagnosis not present

## 2019-02-21 DIAGNOSIS — I10 Essential (primary) hypertension: Secondary | ICD-10-CM | POA: Insufficient documentation

## 2019-02-21 DIAGNOSIS — I629 Nontraumatic intracranial hemorrhage, unspecified: Secondary | ICD-10-CM | POA: Diagnosis not present

## 2019-02-21 DIAGNOSIS — Z5189 Encounter for other specified aftercare: Secondary | ICD-10-CM | POA: Diagnosis not present

## 2019-02-21 DIAGNOSIS — F1010 Alcohol abuse, uncomplicated: Secondary | ICD-10-CM | POA: Insufficient documentation

## 2019-02-21 DIAGNOSIS — G8194 Hemiplegia, unspecified affecting left nondominant side: Secondary | ICD-10-CM | POA: Diagnosis not present

## 2019-02-21 NOTE — Progress Notes (Signed)
Botox Injection for spasticity using needle EMG guidance  Dilution: 50 Units/ml Indication: Severe spasticity which interferes with ADL,mobility and/or  hygiene and is unresponsive to medication management and other conservative care Informed consent was obtained after describing risks and benefits of the procedure with the patient. This includes bleeding, bruising, infection, excessive weakness, or medication side effects. A REMS form is on file and signed. Needle: 25g 2" needle electrode Number of units per muscle  LEFT dominant  FDS50 FDP 50 FCR 50  FCU 25 FPL 25 All injections were done after obtaining appropriate EMG activity and after negative drawback for blood. The patient tolerated the procedure well. Post procedure instructions were given. A followup appointment was made.

## 2019-02-21 NOTE — Patient Instructions (Signed)

## 2019-03-11 DIAGNOSIS — Z0000 Encounter for general adult medical examination without abnormal findings: Secondary | ICD-10-CM | POA: Diagnosis not present

## 2019-03-11 DIAGNOSIS — I10 Essential (primary) hypertension: Secondary | ICD-10-CM | POA: Diagnosis not present

## 2019-03-11 DIAGNOSIS — E782 Mixed hyperlipidemia: Secondary | ICD-10-CM | POA: Diagnosis not present

## 2019-03-11 DIAGNOSIS — I69359 Hemiplegia and hemiparesis following cerebral infarction affecting unspecified side: Secondary | ICD-10-CM | POA: Diagnosis not present

## 2019-03-28 ENCOUNTER — Telehealth: Payer: Self-pay | Admitting: *Deleted

## 2019-03-28 DIAGNOSIS — R6882 Decreased libido: Secondary | ICD-10-CM | POA: Diagnosis not present

## 2019-03-28 NOTE — Telephone Encounter (Signed)
Mrs Proudfoot called and has a question about some medical equipment they are thinking about getting.( Please return call.) I attempted to reach Mrs Hosang. No answer. I left VM requesting they call back with more detail so that we may attempt to answer their questions.

## 2019-04-04 ENCOUNTER — Encounter: Payer: Self-pay | Admitting: Physical Medicine & Rehabilitation

## 2019-04-04 ENCOUNTER — Other Ambulatory Visit: Payer: Self-pay

## 2019-04-04 ENCOUNTER — Encounter: Payer: Medicare Other | Attending: Physical Medicine & Rehabilitation | Admitting: Physical Medicine & Rehabilitation

## 2019-04-04 VITALS — BP 113/63 | HR 72 | Temp 98.0°F | Ht 70.0 in | Wt 158.0 lb

## 2019-04-04 DIAGNOSIS — Z5189 Encounter for other specified aftercare: Secondary | ICD-10-CM | POA: Diagnosis not present

## 2019-04-04 DIAGNOSIS — I639 Cerebral infarction, unspecified: Secondary | ICD-10-CM | POA: Diagnosis present

## 2019-04-04 DIAGNOSIS — R252 Cramp and spasm: Secondary | ICD-10-CM | POA: Insufficient documentation

## 2019-04-04 DIAGNOSIS — G8194 Hemiplegia, unspecified affecting left nondominant side: Secondary | ICD-10-CM | POA: Diagnosis not present

## 2019-04-04 DIAGNOSIS — G8110 Spastic hemiplegia affecting unspecified side: Secondary | ICD-10-CM

## 2019-04-04 DIAGNOSIS — I10 Essential (primary) hypertension: Secondary | ICD-10-CM | POA: Insufficient documentation

## 2019-04-04 DIAGNOSIS — F1010 Alcohol abuse, uncomplicated: Secondary | ICD-10-CM | POA: Diagnosis not present

## 2019-04-04 DIAGNOSIS — I629 Nontraumatic intracranial hemorrhage, unspecified: Secondary | ICD-10-CM | POA: Insufficient documentation

## 2019-04-04 NOTE — Patient Instructions (Signed)

## 2019-04-04 NOTE — Progress Notes (Signed)
Subjective:    Patient ID: Jon [name], male    DOB: 1956/01/23, 63 y.o.   MRN: [id number]  HPI  Patient follows up after Botox injection patient was satisfied with his result. He is asking for some exercises to do with head forward posture.  Has some neck pain mainly when he tries to lay down at night Botox 7/30 FDS50 FDP 50 FCR 50  FCU 25 FPL 25  Rarely uses WC unless he is getting food, otherwise using 4 pt cane  Pain Inventory Average Pain 0 Pain Right Now 0 My pain is na  In the last 24 hours, has pain interfered with the following? General activity 0 Relation with others 0 Enjoyment of life 0 What TIME of day is your pain at its worst? na Sleep (in general) Good  Pain is worse with: na Pain improves with: na Relief from Meds: na  Mobility use a cane ability to climb steps?  yes do you drive?  no  Function retired I need assistance with the following:  meal prep, household duties and shopping  Neuro/Psych bladder control problems weakness trouble walking  Prior Studies Any changes since last visit?  no  Physicians involved in your care Any changes since last visit?  no   No family history on file. Social History   Socioeconomic History  . Marital status: Married    Spouse name: Not on file  . Number of children: Not on file  . Years of education: Not on file  . Highest education level: Not on file  Occupational History  . Not on file  Social Needs  . Financial resource strain: Not on file  . Food insecurity    Worry: Not on file    Inability: Not on file  . Transportation needs    Medical: Not on file    Non-medical: Not on file  Tobacco Use  . Smoking status: Former Smoker    Packs/day: 0.50    Years: 19.00    Pack years: 9.50    Types: Cigarettes    Quit date: 02/22/2000    Years since quitting: 19.1  . Smokeless tobacco: Never Used  Substance and Sexual Activity  . Alcohol use: No    Alcohol/week: 12.0 standard drinks   Types: 12 Glasses of wine per week    Comment: Wife reports chronic alcohol use. 01/12/16  . Drug use: No    Types: Marijuana    Comment: teens to early 20's  . Sexual activity: Not on file  Lifestyle  . Physical activity    Days per week: Not on file    Minutes per session: Not on file  . Stress: Not on file  Relationships  . Social Musicianconnections    Talks on phone: Not on file    Gets together: Not on file    Attends religious service: Not on file    Active member of club or organization: Not on file    Attends meetings of clubs or organizations: Not on file    Relationship status: Not on file  Other Topics Concern  . Not on file  Social History Narrative  . Not on file   Past Surgical History:  Procedure Laterality Date  . TONSILLECTOMY AND ADENOIDECTOMY Bilateral    Age 40   Past Medical History:  Diagnosis Date  . Hypertension   . Liver damage   . Stroke (HCC)    BP 113/63   Pulse 72   Temp 98 F (  36.7 C)   Ht 5\' 10"  (1.778 m)   Wt 158 lb (71.7 kg)   SpO2 94%   BMI 22.67 kg/m   Opioid Risk Score:   Fall Risk Score:  `1  Depression screen PHQ 2/9  Depression screen [facility] 2/9 01/01/2019 01/01/2018 11/20/2017 10/06/2017 05/19/2016 04/21/2016  Decreased Interest 0 0 0 0 0 0  Down, Depressed, Hopeless 0 0 0 0 0 0  PHQ - 2 Score 0 0 0 0 0 0     Review of Systems  Constitutional: Negative.   HENT: Negative.   Eyes: Negative.   Respiratory: Negative.   Cardiovascular: Negative.   Gastrointestinal: Negative.   Endocrine: Negative.   Genitourinary: Positive for difficulty urinating.  Musculoskeletal: Positive for gait problem.  Skin: Negative.   Allergic/Immunologic: Negative.   Neurological: Positive for weakness.  Hematological: Negative.   Psychiatric/Behavioral: Negative.   All other systems reviewed and are negative.      Objective:   Physical Exam Vitals signs and nursing note reviewed.  Constitutional:      Appearance: Normal appearance.  Eyes:      Extraocular Movements: Extraocular movements intact.     Pupils: Pupils are equal, round, and reactive to light.  Skin:    General: Skin is warm and dry.  Neurological:     General: No focal deficit present.     Mental Status: He is alert and oriented to person, place, and time.     Comments: Tone MAS 2 at FDS and FDP MAS 2 at wrist flexor MAS 1 at the thumb flexor  Psychiatric:        Mood and Affect: Mood normal.        Behavior: Behavior normal.     Head forward posture no pain to palpation of the cervical spine.  Limited cervical spine range of motion with flexion extension lateral bending and rotation      Assessment & Plan:  #1.  Left spastic hemiparesis secondary to CVA, thalamic bleed.  Doing well with current dosing of Botox will repeat in 6 weeks. We reviewed some neck exercises

## 2019-04-25 ENCOUNTER — Other Ambulatory Visit: Payer: Self-pay | Admitting: Physical Medicine & Rehabilitation

## 2019-05-03 ENCOUNTER — Emergency Department (HOSPITAL_COMMUNITY): Payer: Medicare Other

## 2019-05-03 ENCOUNTER — Telehealth: Payer: Self-pay | Admitting: Physical Medicine & Rehabilitation

## 2019-05-03 ENCOUNTER — Inpatient Hospital Stay (HOSPITAL_COMMUNITY)
Admission: EM | Admit: 2019-05-03 | Discharge: 2019-05-07 | DRG: 100 | Disposition: A | Discharge status: Home Health | Payer: Medicare Other | Attending: Internal Medicine | Admitting: Internal Medicine

## 2019-05-03 DIAGNOSIS — Z791 Long term (current) use of non-steroidal anti-inflammatories (NSAID): Secondary | ICD-10-CM | POA: Diagnosis not present

## 2019-05-03 DIAGNOSIS — D696 Thrombocytopenia, unspecified: Secondary | ICD-10-CM | POA: Diagnosis not present

## 2019-05-03 DIAGNOSIS — I69354 Hemiplegia and hemiparesis following cerebral infarction affecting left non-dominant side: Secondary | ICD-10-CM | POA: Diagnosis not present

## 2019-05-03 DIAGNOSIS — I69359 Hemiplegia and hemiparesis following cerebral infarction affecting unspecified side: Secondary | ICD-10-CM

## 2019-05-03 DIAGNOSIS — G9389 Other specified disorders of brain: Secondary | ICD-10-CM | POA: Diagnosis not present

## 2019-05-03 DIAGNOSIS — R739 Hyperglycemia, unspecified: Secondary | ICD-10-CM | POA: Diagnosis present

## 2019-05-03 DIAGNOSIS — Z9089 Acquired absence of other organs: Secondary | ICD-10-CM | POA: Diagnosis not present

## 2019-05-03 DIAGNOSIS — F329 Major depressive disorder, single episode, unspecified: Secondary | ICD-10-CM | POA: Diagnosis present

## 2019-05-03 DIAGNOSIS — T428X5A Adverse effect of antiparkinsonism drugs and other central muscle-tone depressants, initial encounter: Secondary | ICD-10-CM | POA: Diagnosis present

## 2019-05-03 DIAGNOSIS — Z7982 Long term (current) use of aspirin: Secondary | ICD-10-CM

## 2019-05-03 DIAGNOSIS — Z781 Physical restraint status: Secondary | ICD-10-CM | POA: Diagnosis not present

## 2019-05-03 DIAGNOSIS — I69319 Unspecified symptoms and signs involving cognitive functions following cerebral infarction: Secondary | ICD-10-CM | POA: Diagnosis not present

## 2019-05-03 DIAGNOSIS — R4781 Slurred speech: Secondary | ICD-10-CM | POA: Diagnosis present

## 2019-05-03 DIAGNOSIS — Z87891 Personal history of nicotine dependence: Secondary | ICD-10-CM

## 2019-05-03 DIAGNOSIS — G9340 Encephalopathy, unspecified: Secondary | ICD-10-CM

## 2019-05-03 DIAGNOSIS — R29810 Facial weakness: Secondary | ICD-10-CM | POA: Diagnosis not present

## 2019-05-03 DIAGNOSIS — Z20828 Contact with and (suspected) exposure to other viral communicable diseases: Secondary | ICD-10-CM | POA: Diagnosis not present

## 2019-05-03 DIAGNOSIS — I10 Essential (primary) hypertension: Secondary | ICD-10-CM | POA: Diagnosis not present

## 2019-05-03 DIAGNOSIS — F1021 Alcohol dependence, in remission: Secondary | ICD-10-CM | POA: Diagnosis present

## 2019-05-03 DIAGNOSIS — G9341 Metabolic encephalopathy: Secondary | ICD-10-CM | POA: Diagnosis not present

## 2019-05-03 DIAGNOSIS — R0902 Hypoxemia: Secondary | ICD-10-CM | POA: Diagnosis not present

## 2019-05-03 DIAGNOSIS — R456 Violent behavior: Secondary | ICD-10-CM | POA: Diagnosis not present

## 2019-05-03 DIAGNOSIS — G40501 Epileptic seizures related to external causes, not intractable, with status epilepticus: Secondary | ICD-10-CM | POA: Diagnosis not present

## 2019-05-03 DIAGNOSIS — G92 Toxic encephalopathy: Secondary | ICD-10-CM | POA: Diagnosis present

## 2019-05-03 DIAGNOSIS — G40901 Epilepsy, unspecified, not intractable, with status epilepticus: Secondary | ICD-10-CM

## 2019-05-03 DIAGNOSIS — Z79899 Other long term (current) drug therapy: Secondary | ICD-10-CM

## 2019-05-03 DIAGNOSIS — E785 Hyperlipidemia, unspecified: Secondary | ICD-10-CM | POA: Diagnosis present

## 2019-05-03 DIAGNOSIS — Z03818 Encounter for observation for suspected exposure to other biological agents ruled out: Secondary | ICD-10-CM | POA: Diagnosis not present

## 2019-05-03 DIAGNOSIS — I69391 Dysphagia following cerebral infarction: Secondary | ICD-10-CM | POA: Diagnosis not present

## 2019-05-03 DIAGNOSIS — R4182 Altered mental status, unspecified: Secondary | ICD-10-CM | POA: Diagnosis not present

## 2019-05-03 DIAGNOSIS — E876 Hypokalemia: Secondary | ICD-10-CM | POA: Diagnosis not present

## 2019-05-03 DIAGNOSIS — R404 Transient alteration of awareness: Secondary | ICD-10-CM | POA: Diagnosis not present

## 2019-05-03 DIAGNOSIS — R569 Unspecified convulsions: Secondary | ICD-10-CM | POA: Diagnosis not present

## 2019-05-03 LAB — URINALYSIS, ROUTINE W REFLEX MICROSCOPIC
Bilirubin Urine: NEGATIVE
Glucose, UA: NEGATIVE mg/dL
Ketones, ur: 5 mg/dL — AB
Leukocytes,Ua: NEGATIVE
Nitrite: NEGATIVE
Protein, ur: 30 mg/dL — AB
Specific Gravity, Urine: 1.018 (ref 1.005–1.030)
pH: 5 (ref 5.0–8.0)

## 2019-05-03 LAB — CBC
HCT: 45.9 % (ref 39.0–52.0)
Hemoglobin: 15.1 g/dL (ref 13.0–17.0)
MCH: 31.7 pg (ref 26.0–34.0)
MCHC: 32.9 g/dL (ref 30.0–36.0)
MCV: 96.2 fL (ref 80.0–100.0)
Platelets: 191 10*3/uL (ref 150–400)
RBC: 4.77 MIL/uL (ref 4.22–5.81)
RDW: 12.7 % (ref 11.5–15.5)
WBC: 10.6 10*3/uL — ABNORMAL HIGH (ref 4.0–10.5)
nRBC: 0 % (ref 0.0–0.2)

## 2019-05-03 LAB — COMPREHENSIVE METABOLIC PANEL
ALT: 14 U/L (ref 0–44)
AST: 22 U/L (ref 15–41)
Albumin: 4.2 g/dL (ref 3.5–5.0)
Alkaline Phosphatase: 53 U/L (ref 38–126)
Anion gap: 21 — ABNORMAL HIGH (ref 5–15)
BUN: 6 mg/dL — ABNORMAL LOW (ref 8–23)
CO2: 16 mmol/L — ABNORMAL LOW (ref 22–32)
Calcium: 9.1 mg/dL (ref 8.9–10.3)
Chloride: 103 mmol/L (ref 98–111)
Creatinine, Ser: 0.92 mg/dL (ref 0.61–1.24)
GFR calc Af Amer: >60 mL/min (ref >60)
GFR calc non Af Amer: >60 mL/min (ref >60)
Glucose, Bld: 161 mg/dL — ABNORMAL HIGH (ref 70–99)
Potassium: 3.9 mmol/L (ref 3.5–5.1)
Sodium: 140 mmol/L (ref 135–145)
Total Bilirubin: 1.1 mg/dL (ref 0.3–1.2)
Total Protein: 7.3 g/dL (ref 6.5–8.1)

## 2019-05-03 LAB — I-STAT CHEM 8, ED
BUN: 7 mg/dL — ABNORMAL LOW (ref 8–23)
Calcium, Ion: 1.06 mmol/L — ABNORMAL LOW (ref 1.15–1.40)
Chloride: 107 mmol/L (ref 98–111)
Creatinine, Ser: 0.7 mg/dL (ref 0.61–1.24)
Glucose, Bld: 155 mg/dL — ABNORMAL HIGH (ref 70–99)
HCT: 46 % (ref 39.0–52.0)
Hemoglobin: 15.6 g/dL (ref 13.0–17.0)
Potassium: 3.8 mmol/L (ref 3.5–5.1)
Sodium: 141 mmol/L (ref 135–145)
TCO2: 18 mmol/L — ABNORMAL LOW (ref 22–32)

## 2019-05-03 LAB — ETHANOL: Alcohol, Ethyl (B): <10 mg/dL (ref <10)

## 2019-05-03 LAB — DIFFERENTIAL
Abs Immature Granulocytes: 0.03 10*3/uL (ref 0.00–0.07)
Basophils Absolute: 0.1 10*3/uL (ref 0.0–0.1)
Basophils Relative: 1 %
Eosinophils Absolute: 0.3 10*3/uL (ref 0.0–0.5)
Eosinophils Relative: 2 %
Immature Granulocytes: 0 %
Lymphocytes Relative: 33 %
Lymphs Abs: 3.5 10*3/uL (ref 0.7–4.0)
Monocytes Absolute: 1.1 10*3/uL — ABNORMAL HIGH (ref 0.1–1.0)
Monocytes Relative: 11 %
Neutro Abs: 5.6 10*3/uL (ref 1.7–7.7)
Neutrophils Relative %: 53 %

## 2019-05-03 LAB — RAPID URINE DRUG SCREEN, HOSP PERFORMED
Amphetamines: NOT DETECTED
Barbiturates: NOT DETECTED
Benzodiazepines: NOT DETECTED
Cocaine: NOT DETECTED
Opiates: NOT DETECTED
Tetrahydrocannabinol: NOT DETECTED

## 2019-05-03 LAB — CK: Total CK: 41 U/L — ABNORMAL LOW (ref 49–397)

## 2019-05-03 LAB — PROTIME-INR
INR: 1 (ref 0.8–1.2)
Prothrombin Time: 13.3 seconds (ref 11.4–15.2)

## 2019-05-03 LAB — PROTEIN, CSF: Total  Protein, CSF: 47 mg/dL — ABNORMAL HIGH (ref 15–45)

## 2019-05-03 LAB — HIV ANTIBODY (ROUTINE TESTING W REFLEX): HIV Screen 4th Generation wRfx: NONREACTIVE

## 2019-05-03 LAB — GLUCOSE, CAPILLARY
Glucose-Capillary: 101 mg/dL — ABNORMAL HIGH (ref 70–99)
Glucose-Capillary: 81 mg/dL (ref 70–99)

## 2019-05-03 LAB — GLUCOSE, CSF: Glucose, CSF: 71 mg/dL — ABNORMAL HIGH (ref 40–70)

## 2019-05-03 LAB — MAGNESIUM: Magnesium: 2 mg/dL (ref 1.7–2.4)

## 2019-05-03 LAB — SARS CORONAVIRUS 2 (TAT 6-24 HRS): SARS Coronavirus 2: NEGATIVE

## 2019-05-03 LAB — AMMONIA: Ammonia: 27 umol/L (ref 9–35)

## 2019-05-03 LAB — APTT: aPTT: 26 seconds (ref 24–36)

## 2019-05-03 LAB — PHOSPHORUS: Phosphorus: 2.9 mg/dL (ref 2.5–4.6)

## 2019-05-03 LAB — TSH: TSH: 3.682 u[IU]/mL (ref 0.350–4.500)

## 2019-05-03 LAB — MRSA PCR SCREENING: MRSA by PCR: NEGATIVE

## 2019-05-03 MED ORDER — SODIUM CHLORIDE 0.9 % IV BOLUS
1000.0000 mL | Freq: Once | INTRAVENOUS | Status: AC
  Administered 2019-05-03: 1000 mL via INTRAVENOUS

## 2019-05-03 MED ORDER — SODIUM CHLORIDE 0.9 % IV SOLN
2000.0000 mg | Freq: Once | INTRAVENOUS | Status: AC
  Administered 2019-05-03: 2000 mg via INTRAVENOUS

## 2019-05-03 MED ORDER — LORAZEPAM 2 MG/ML IJ SOLN
1.0000 mg | Freq: Once | INTRAMUSCULAR | Status: AC
  Administered 2019-05-03: 1 mg via INTRAVENOUS

## 2019-05-03 MED ORDER — ACETAMINOPHEN 325 MG PO TABS
650.0000 mg | ORAL_TABLET | ORAL | Status: DC | PRN
  Administered 2019-05-06: 650 mg via ORAL
  Filled 2019-05-03: qty 2

## 2019-05-03 MED ORDER — LORAZEPAM 2 MG/ML IJ SOLN
INTRAMUSCULAR | Status: AC
  Filled 2019-05-03: qty 1

## 2019-05-03 MED ORDER — CHLORHEXIDINE GLUCONATE CLOTH 2 % EX PADS
6.0000 | MEDICATED_PAD | Freq: Every day | CUTANEOUS | Status: DC
  Administered 2019-05-03 – 2019-05-07 (×4): 6 via TOPICAL

## 2019-05-03 MED ORDER — LIDOCAINE HCL (PF) 1 % IJ SOLN
5.0000 mL | Freq: Once | INTRAMUSCULAR | Status: AC
  Administered 2019-05-03: 5 mL

## 2019-05-03 MED ORDER — LORAZEPAM 2 MG/ML IJ SOLN
2.0000 mg | Freq: Once | INTRAMUSCULAR | Status: AC
  Administered 2019-05-03: 2 mg via INTRAVENOUS
  Filled 2019-05-03: qty 1

## 2019-05-03 MED ORDER — ONDANSETRON HCL 4 MG/2ML IJ SOLN: 4.0000 mg | Freq: Four times a day (QID) | INTRAMUSCULAR | Status: DC | PRN

## 2019-05-03 MED ORDER — HEPARIN SODIUM (PORCINE) 5000 UNIT/ML IJ SOLN
5000.0000 [IU] | Freq: Three times a day (TID) | INTRAMUSCULAR | Status: DC
  Administered 2019-05-03 – 2019-05-07 (×12): 5000 [IU] via SUBCUTANEOUS
  Filled 2019-05-03 (×12): qty 1

## 2019-05-03 MED ORDER — LEVETIRACETAM IN NACL 1000 MG/100ML IV SOLN: 1000.0000 mg | INTRAVENOUS | Status: DC

## 2019-05-03 MED ORDER — SODIUM CHLORIDE 0.9 % IV SOLN
1500.0000 mg | Freq: Once | INTRAVENOUS | Status: DC
  Filled 2019-05-03: qty 30

## 2019-05-03 MED ORDER — LIDOCAINE HCL (PF) 1 % IJ SOLN
INTRAMUSCULAR | Status: AC
  Filled 2019-05-03: qty 30

## 2019-05-03 MED ORDER — LEVETIRACETAM IN NACL 1000 MG/100ML IV SOLN: 1000.0000 mg | Freq: Once | INTRAVENOUS | Status: DC

## 2019-05-03 MED ORDER — PANTOPRAZOLE SODIUM 40 MG IV SOLR
40.0000 mg | Freq: Every day | INTRAVENOUS | Status: DC
  Administered 2019-05-03 – 2019-05-04 (×2): 40 mg via INTRAVENOUS
  Filled 2019-05-03 (×2): qty 40

## 2019-05-03 MED ORDER — SODIUM CHLORIDE 0.9 % IV SOLN
INTRAVENOUS | Status: DC
  Administered 2019-05-03 – 2019-05-05 (×3): via INTRAVENOUS

## 2019-05-03 MED ORDER — LORAZEPAM 2 MG/ML IJ SOLN
INTRAMUSCULAR | Status: AC
  Administered 2019-05-03: 2 mg
  Filled 2019-05-03: qty 1

## 2019-05-03 MED ORDER — DEXMEDETOMIDINE HCL IN NACL 200 MCG/50ML IV SOLN
0.4000 ug/kg/h | INTRAVENOUS | Status: DC
  Administered 2019-05-03: 0.4 ug/kg/h via INTRAVENOUS
  Administered 2019-05-03: 0.2 ug/kg/h via INTRAVENOUS
  Administered 2019-05-04: 0.3 ug/kg/h via INTRAVENOUS
  Filled 2019-05-03 (×5): qty 50

## 2019-05-03 MED ORDER — LIDOCAINE HCL (PF) 1 % IJ SOLN
INTRAMUSCULAR | Status: AC
  Filled 2019-05-03: qty 5

## 2019-05-03 MED ORDER — LIDOCAINE HCL (PF) 1 % IJ SOLN: 30.0000 mL | Freq: Once | INTRAMUSCULAR | Status: DC

## 2019-05-03 NOTE — H&P (Signed)
/  NAME:  Jon [name], MRN:  [id number], DOB:  08-21-1955, LOS: 0 ADMISSION DATE:  05/03/2019, CONSULTATION DATE:  05/03/2019 REFERRING MD:  Dr. Lorelle FormosaHanna, CHIEF COMPLAINT:  Seizure    Brief History   63yo M with history of Basal Ganglia hemorrhage in 2017 presented with new onset seizures and agitation  History of present illness   Presented with tonic clonic seizure like activity per wife that began at approximately 6am. Wife states she was awoken due to shaking in the bed after which she found patient having what appeared to be a seizure. Patient was able to respond to wife during seizure activity, when asked if she should call 911 the patient responded yes. Wife denies loss of bowel or bladder during seizure activity. No prior history of seizures per wife. Patient dose have remote history of chronic alcohol and tobacco abuse but wife states both were stopped 3 years ago after stroke. Wife stated she dose all the shopping ans errand due to limitations from stroke and patient has ha no access to alcohol since stroke. Unable to obtain history from patient due to sedation and agitation. Wife denies any other history including no recent ever, sick contact, chills, weight changes, shortness of breath, chest pain, or lower extremity swelling or pain.    Workup on admission with mild tachycardia, negative toxicology, negative head CT and CXR. MRI brian pending. No seizure activity since since admission.   Past Medical History  Basal Ganglia hemorrhage in 2017 Hypertension Alcohol abuse, stopped 2017 Tobacco abuse, stopped 2017    Significant Hospital Events   10/9 Admitted   Consults:  PCCM Neurology   Procedures:    Significant Diagnostic Tests:  Head CT 10/9 >> negative CRX 10/9 >> negative   Micro Data:  COVID 10/9 >>  Antimicrobials:    Interim history/subjective:  Patient seen lying in bed in four point restraints confused, unable to follow commands or answer questions. Wife at  bedside and updated on plan   Objective   Blood pressure (!) 141/68, pulse (!) 105, temperature 98.4 F (36.9 C), temperature source Temporal, resp. rate 14, SpO2 98 %.        Intake/Output Summary (Last 24 hours) at 05/03/2019 1112 Last data filed at 05/03/2019 0810 Gross per 24 hour  Intake 200 ml  Output -  Net 200 ml   There were no vitals filed for this visit.  Examination: General: Chronically ill appearing elderly male lying in bed, in NAD HEENT: MM pink/moist, PERRL, full passive range of motion to neck  Neuro: moves all extremities spontaneously with decreased movement to right per patients baseline CV: s1s2 regular rate and rhythm, no murmur, rubs, or gallops,  PULM: Clear to ascultation bilaterally, no added breath sounds, no increased work of breathing  GI: soft, bowel sounds active in all 4 quadrants, non-tender, non-distended Extremities: warm/dry, no edema  Skin: no rashes or lesions  Resolved Hospital Problem list     Assessment & Plan:  New onset seizures Metabolic encephalopathy  -No history of seizure and negative for alcohol consumption for several years. EEG in ED negative for acute seizure activity  toxicology and ETOH level negative  P: Precedex for sedation Lumbar puncture to be preformed in ED EEG  Antiepileptics per neurology  Seizure precautions MRI brain   Hypertension Hyperlipidemia  P: Hold home antihypertensives including beta blocker given need for Precedex  Resume home statin once able to safely take oral meds   Depression P: Resume antidepressants once  able to take oral meds     Best practice:  Diet: NPO Pain/Anxiety/Delirium protocol (if indicated): Precedex VAP protocol (if indicated): N/A DVT prophylaxis: Subq heparin  GI prophylaxis: PPI Glucose control: Monitor  Mobility: bedrest  Code Status: Full, verified with wife Family Communication: Wife updated at length at bedside  Disposition: ICU  Labs   CBC: Recent  Labs  Lab 05/03/19 0707 05/03/19 0721  WBC 10.6*  --   NEUTROABS 5.6  --   HGB 15.1 15.6  HCT 45.9 46.0  MCV 96.2  --   PLT 191  --     Basic Metabolic Panel: Recent Labs  Lab 05/03/19 0707 05/03/19 0721  NA 140 141  K 3.9 3.8  CL 103 107  CO2 16*  --   GLUCOSE 161* 155*  BUN 6* 7*  CREATININE 0.92 0.70  CALCIUM 9.1  --   MG 2.0  --   PHOS 2.9  --    GFR: CrCl cannot be calculated (Unknown ideal weight.). Recent Labs  Lab 05/03/19 0707  WBC 10.6*    Liver Function Tests: Recent Labs  Lab 05/03/19 0707  AST 22  ALT 14  ALKPHOS 53  BILITOT 1.1  PROT 7.3  ALBUMIN 4.2   No results for input(s): LIPASE, AMYLASE in the last 168 hours. Recent Labs  Lab 05/03/19 0807  AMMONIA 27    ABG    Component Value Date/Time   TCO2 18 (L) 05/03/2019 0721     Coagulation Profile: Recent Labs  Lab 05/03/19 0707  INR 1.0    Cardiac Enzymes: Recent Labs  Lab 05/03/19 0707  CKTOTAL 41*    HbA1C: Hgb A1c MFr Bld  Date/Time Value Ref Range Status  01/09/2016 08:07 AM 5.5 4.8 - 5.6 % Final    Comment:    (NOTE)         Pre-diabetes: 5.7 - 6.4         Diabetes: >6.4         Glycemic control for adults with diabetes: <7.0     CBG: No results for input(s): GLUCAP in the last 168 hours.  Review of Systems:   Unable to obtain due to confusion and   Past Medical History  He,  has a past medical history of Hypertension, Liver damage, and Stroke (HCC).    Surgical History    Past Surgical History:  Procedure Laterality Date  . TONSILLECTOMY AND ADENOIDECTOMY Bilateral    Age 44      Social History   reports that he quit smoking about 19 years ago. His smoking use included cigarettes. He has a 9.50 pack-year smoking history. He has never used smokeless tobacco. He reports that he does not drink alcohol or use drugs.    Family History   His family history is not on file.    Allergies Allergies  Allergen Reactions  . Gluten Meal Nausea And  Vomiting  . Lactose Intolerance (Gi) Nausea And Vomiting  . Other     Tree nuts   . Shellfish Allergy Swelling and Rash      Home Medications  Prior to Admission medications   Medication Sig Start Date End Date Taking? Authorizing Provider  aspirin EC 81 MG tablet Take 1 tablet (81 mg total) by mouth daily. 05/27/16   Marvel Plan, MD  baclofen (LIORESAL) 20 MG tablet TAKE 1 TABLET BY MOUTH FOUR TIMES DAILY 04/25/19   Kirsteins, Victorino Sparrow, MD  diclofenac sodium (VOLTAREN) 1 % GEL Apply 2  g topically 4 (four) times daily. Patient taking differently: Apply 2 g topically as needed.  02/04/16   Angiulli, Lavon Paganini, PA-C  folic acid (FOLVITE) 1 MG tablet Take 1 tablet (1 mg total) by mouth daily. 02/04/16   Angiulli, Lavon Paganini, PA-C  metoprolol tartrate (LOPRESSOR) 50 MG tablet Take 25 mg by mouth 2 (two) times daily.    [provider]  Multiple Vitamin (MULTIVITAMIN WITH MINERALS) TABS tablet Take 1 tablet by mouth daily. 02/04/16   Angiulli, Lavon Paganini, PA-C  pantoprazole (PROTONIX) 40 MG tablet Take 1 tablet (40 mg total) by mouth daily. 02/04/16   Angiulli, Lavon Paganini, PA-C  pravastatin (PRAVACHOL) 40 MG tablet Take 1 tablet (40 mg total) by mouth daily at 6 PM. 02/04/16   Angiulli, Lavon Paganini, PA-C  sertraline (ZOLOFT) 50 MG tablet Take 1 tablet (50 mg total) by mouth daily. 02/04/16   Angiulli, Lavon Paganini, PA-C  Tadalafil (CIALIS PO) Take by mouth 1 day or 1 dose.     [provider]    Critical care time:      Johnsie Cancel, NP-C [facility] Pgr: [phone] or if no answer [phone] 05/03/2019, 12:15 PM

## 2019-05-03 NOTE — Procedures (Addendum)
Patient Name: BUBBA VANBENSCHOTEN  MRN: [id number]  Epilepsy Attending: Lora Havens  Referring Physician/Provider: Dr Amie [place] Date: 05/03/2019 Duration: 24.55 mins  Patient history: 63yo with prior stroke and left hemiparesis who presented with seizure like episode. EEG to evaluate for seizure  Level of alertness: asleep/sedated  AEDs during EEG study: ativan  Technical aspects: This EEG study was done with scalp electrodes positioned according to the 10-20 International system of electrode placement. Electrical activity was acquired at a sampling rate of 500Hz  and reviewed with a high frequency filter of 70Hz  and a low frequency filter of 1Hz . EEG data were recorded continuously and digitally stored.   DESCRIPTION: EEG showed continuous generalized low voltage 2-3Hz  delta slowing admixed with 13-15Hz  diffuse beta activity. EEG was reactive to tactile stimulation. EEG Hyperventilation and photic stimulation were not performed.  ABNORMALITY - Continuous slow, generalized  IMPRESSION:  This study is suggestive of severe diffuse encephalopathy, likely secondary to the ativan use. No seizures or definite epileptiform discharges were seen throughout the recording.  The excessive beta activity seen in the background is most likely due to the effect of benzodiazepine and is a benign EEG pattern.  Maximus Hoffert Barbra Sarks

## 2019-05-03 NOTE — ED Triage Notes (Signed)
Ativan 1 mg IV per verbal order Dr. Johnney Killian given by Rolene Arbour, RN

## 2019-05-03 NOTE — ED Provider Notes (Addendum)
[facility] EMERGENCY DEPARTMENT Provider Note   CSN: [id number] Arrival date & time: 05/03/19  0258     History   Chief Complaint Chief Complaint  Patient presents with   Seizures    HPI Jon [name] is a 63 y.o. male.     HPI Patient with history of right basal ganglia hemorrhagic stroke, distant.  He has residual significant weakness in the left upper extremity and some movement and ability to walk using the left lower extremity.  Patient's wife reports that he speaks and interacts normally with her.  Yesterday evening at 1130 she went to bed.  They have been watching television.  She reports sometime in the middle of the night the talked briefly.  She reports he asked her something about "if she was going to stay on her side of the bed".  She reports went back to sleep.  At 615, she was awakened by severe shaking and thrashing in the bed.  She reports his whole body was moving and seizure activity.  She reports she called EMS and it was starting to abate.  For EMS, the patient has been very confused and combative.  They were unable to obtain blood pressure or IV access due to degree of combativeness.  Patient was transferred immediately to the emergency department.  He arrives awake and combative.  He is answering questions.  He can say his name although slightly slurred.  He can identify that he lives in [place].  He recognizes that he is at the hospital.  He denies he is having any pain.  He reports he has to urinate.  He is repeatedly asking to be allowed to get up.  He cannot give me any other historical information.  He cannot have a sustained conversation and answer questions about yesterday.  Patient's wife reports that he has not been sick.  She has not noted him to be febrile.  She reports he has been doing his usual activities.  She reports he has very little use of the left upper extremity.  She reports that he has not had alcohol for 3 years.  He quit  after his first stroke.  She denies any other drug use.  She reports he also quit smoking.  He has been eating as per usual.  No notable changes in baseline activities or function. Past Medical History:  Diagnosis Date   Hypertension    Liver damage    Stroke [facility])     Patient Active Problem List   Diagnosis Date Noted   Spastic hemiplegia affecting dominant side ([place]) 08/24/2017   Neuropathic pain of left shoulder 05/19/2016   Alcohol use disorder, severe, dependence ([facility]) 02/15/2016   Muscle contusion 02/11/2016   Spastic hemiplegia affecting nondominant side ([place])    Muscle spasticity    Poor appetite    Hyperlipidemia 01/14/2016   Depression 01/14/2016   Obesity 01/14/2016   Hyperglycemia 01/14/2016   Urinary tract infection, site not specified 01/14/2016   Basal ganglia hemorrhage ([place]) 01/14/2016   Hemiparesis affecting nondominant side as late effect of cerebrovascular accident [facility])    Cognitive deficit, post-stroke    Left hemiparesis ([place])    Essential hypertension    Dysphagia, post-stroke    Gait disturbance, post-stroke    Hyponatremia    Thrombocytopenia ([place])    Hemorrhagic stroke ([place]) R basal ganglia d/t HTN 01/08/2016    Past Surgical History:  Procedure Laterality Date   TONSILLECTOMY AND ADENOIDECTOMY Bilateral  Age 76        Home Medications    Prior to Admission medications   Medication Sig Start Date End Date Taking? Authorizing Provider  aspirin EC 81 MG tablet Take 1 tablet (81 mg total) by mouth daily. 05/27/16   Marvel Plan, MD  baclofen (LIORESAL) 20 MG tablet TAKE 1 TABLET BY MOUTH FOUR TIMES DAILY 04/25/19   Kirsteins, Victorino Sparrow, MD  diclofenac sodium (VOLTAREN) 1 % GEL Apply 2 g topically 4 (four) times daily. Patient taking differently: Apply 2 g topically as needed.  02/04/16   Angiulli, Mcarthur Rossetti, PA-C  folic acid (FOLVITE) 1 MG tablet Take 1 tablet (1 mg total) by mouth daily. 02/04/16   Angiulli, Mcarthur Rossetti,  PA-C  metoprolol tartrate (LOPRESSOR) 50 MG tablet Take 25 mg by mouth 2 (two) times daily.    [provider]  Multiple Vitamin (MULTIVITAMIN WITH MINERALS) TABS tablet Take 1 tablet by mouth daily. 02/04/16   Angiulli, Mcarthur Rossetti, PA-C  pantoprazole (PROTONIX) 40 MG tablet Take 1 tablet (40 mg total) by mouth daily. 02/04/16   Angiulli, Mcarthur Rossetti, PA-C  pravastatin (PRAVACHOL) 40 MG tablet Take 1 tablet (40 mg total) by mouth daily at 6 PM. 02/04/16   Angiulli, Mcarthur Rossetti, PA-C  sertraline (ZOLOFT) 50 MG tablet Take 1 tablet (50 mg total) by mouth daily. 02/04/16   Angiulli, Mcarthur Rossetti, PA-C  Tadalafil (CIALIS PO) Take by mouth 1 day or 1 dose.     [provider]    Family History No family history on file.  Social History Social History   Tobacco Use   Smoking status: Former Smoker    Packs/day: 0.50    Years: 19.00    Pack years: 9.50    Types: Cigarettes    Quit date: 02/22/2000    Years since quitting: 19.2   Smokeless tobacco: Never Used  Substance Use Topics   Alcohol use: No    Alcohol/week: 12.0 standard drinks    Types: 12 Glasses of wine per week    Comment: Wife reports chronic alcohol use. 01/12/16   Drug use: No    Types: Marijuana    Comment: teens to early 20's     Allergies   Gluten meal, Lactose intolerance (gi), Other, and Shellfish allergy   Review of Systems Review of Systems Level 5 caveat cannot obtain review of systems due to patient combativeness.  Physical Exam Updated Vital Signs BP (!) 141/68    Pulse (!) 105    Temp 98.4 F (36.9 C) (Temporal)    Resp 14    SpO2 98%   Physical Exam Constitutional:      Comments: Patient is awake and breathing spontaneously.  He is not in respiratory distress.  He is anxious and confused in appearance.  Patient is repeatedly trying to get out of the stretcher.  He is having to be restrained by EMS.  Color is good.  He is very warm to the touch.  Mildly diaphoretic.  HENT:     Head:  Normocephalic and atraumatic.     Mouth/Throat:     Mouth: Mucous membranes are moist.     Pharynx: Oropharynx is clear.  Eyes:     Comments: Pupils are 5 mm symmetric.  There responsive to light.  Extraocular motions are intact.  He will look from side to side.  He does not have  gaze deviation.  He is only mildly cooperative for performing exam at request.  He can identify  2 fingers for vision.  Cannot get detailed visual exam because his attention and cooperation are limited.  Cardiovascular:     Rate and Rhythm: Normal rate and regular rhythm.  Pulmonary:     Effort: Pulmonary effort is normal.     Breath sounds: Normal breath sounds.  Abdominal:     Comments: Abdomen is soft and nondistended.  Patient is repeatedly saying he has to urinate.  I do not appreciate a distended or full bladder.  Genitourinary:    Penis: Normal.      Comments: Penis and genitals generally normal in appearance.  No swelling, erythema. Musculoskeletal:     Comments: Patient does not have any peripheral edema.  Extremities do not show deformity or appearance of acute injuries.  Extremities are warm to the touch.  Skin:    General: Skin is warm.     Comments: Patient is very warm to the touch mildly diaphoretic.  Neurological:     Comments: Patient is quite combative.  He is repeatedly trying to get up and move out of the stretcher.  He has very good strength on the right.  He can forcefully push and pull on the right to try to get up and remove restraints.  He is also spontaneously moving and flexing the right lower extremity.  He has very limited movement on the left upper extremity.  He is not trying to withdraw from IV start.  The left lower extremity is doing slight amount of flexion and movement relative to the right.  Very diminished left lower extremity movement relative to the right.  Patient speech is slightly slurred.  He is answering simple questions with short responses.  His attention is very limited.   He cannot have a sustained conversation.  He can identify a pen.  He correctly reports living in [place].  He cannot give the time of the year or month.  He appears to have a slight left facial droop.  Airway is protected.  He can identify 2 fingers with vision.  But cannot cooperate long enough to determine if he has any visual field cut.      ED Treatments / Results  Labs (all labs ordered are listed, but only abnormal results are displayed) Labs Reviewed  CBC - Abnormal; Notable for the following components:      Result Value   WBC 10.6 (*)    All other components within normal limits  DIFFERENTIAL - Abnormal; Notable for the following components:   Monocytes Absolute 1.1 (*)    All other components within normal limits  COMPREHENSIVE METABOLIC PANEL - Abnormal; Notable for the following components:   CO2 16 (*)    Glucose, Bld 161 (*)    BUN 6 (*)    Anion gap 21 (*)    All other components within normal limits  URINALYSIS, ROUTINE W REFLEX MICROSCOPIC - Abnormal; Notable for the following components:   Hgb urine dipstick SMALL (*)    Ketones, ur 5 (*)    Protein, ur 30 (*)    Bacteria, UA FEW (*)    All other components within normal limits  CK - Abnormal; Notable for the following components:   Total CK 41 (*)    All other components within normal limits  I-STAT CHEM 8, ED - Abnormal; Notable for the following components:   BUN 7 (*)    Glucose, Bld 155 (*)    Calcium, Ion 1.06 (*)    TCO2 18 (*)  All other components within normal limits  SARS CORONAVIRUS 2 (TAT 6-24 HRS)  PROTIME-INR  APTT  RAPID URINE DRUG SCREEN, HOSP PERFORMED  AMMONIA  MAGNESIUM  PHOSPHORUS  TSH  ETHANOL    EKG EKG Interpretation  Date/Time:  Friday May 03 2019 07:29:17 EDT Ventricular Rate:  116 PR Interval:    QRS Duration: 99 QT Interval:  327 QTC Calculation: 455 R Axis:   59 Text Interpretation:  Sinus tachycardia Probable left atrial enlargement Borderline T  abnormalities, inferior leads tachycardia, no other sig change from previous Confirmed by Arby Barrette [phone]) on 05/03/2019 8:31:49 AM   Radiology Ct Head Wo Contrast  Result Date: 05/03/2019 CLINICAL DATA:  Altered mental status.  Seizure-like activity. EXAM: CT HEAD WITHOUT CONTRAST TECHNIQUE: Contiguous axial images were obtained from the base of the skull through the vertex without intravenous contrast. COMPARISON:  January 08 2016 FINDINGS: Brain: No evidence of acute infarction, hemorrhage, hydrocephalus, extra-axial collection or mass lesion/mass effect. There is mild chronic diffuse atrophy. Vascular: No hyperdense vessel is noted. Skull: Normal. Negative for fracture or focal lesion. Sinuses/Orbits: Small right maxillary sinus retention cyst is identified. The other visualized sinuses are clear. The orbits are normal. Other: None. IMPRESSION: No focal acute intracranial abnormality identified. Chronic diffuse atrophy. Electronically Signed   By: Sherian Rein M.D.   On: 05/03/2019 08:02   Dg Chest Port 1 View  Result Date: 05/03/2019 CLINICAL DATA:  Altered mental status, seizure EXAM: PORTABLE CHEST 1 VIEW COMPARISON:  01/09/2016 FINDINGS: Cardiomegaly. Both lungs are clear. The visualized skeletal structures are unremarkable. IMPRESSION: Cardiomegaly without acute abnormality of the lungs in AP portable projection. Electronically Signed   By: Lauralyn Primes M.D.   On: 05/03/2019 09:47    Procedures .Lumbar Puncture  Date/Time: 05/03/2019 1:48 PM Performed by: Arby Barrette, MD Authorized by: Arby Barrette, MD   Consent:    Consent obtained:  Verbal   Consent given by:  Spouse   Risks discussed:  Pain, repeat procedure, infection and bleeding   Alternatives discussed:  Alternative treatment Pre-procedure details:    Procedure purpose:  Diagnostic   Preparation: Patient was prepped and draped in usual sterile fashion   Sedation:    Sedation type:  Deep Anesthesia (see MAR for  exact dosages):    Anesthesia method:  Local infiltration   Local anesthetic:  Lidocaine 1% w/o epi Procedure details:    Lumbar space:  L3-L4 interspace   Patient position:  L lateral decubitus   Needle gauge:  20   Needle type:  Spinal needle - Quincke tip   Needle length (in):  3.5   Ultrasound guidance: no     Number of attempts:  3   Fluid appearance:  Clear   Tubes of fluid:  4   Total volume (ml):  4 Post-procedure:    Puncture site:  Adhesive bandage applied   Patient tolerance of procedure:  Tolerated well, no immediate complications Comments:     L4-5 interspace was first attempted.  Several attempts made but at each time and countered bony resistance.  Moved to L3-L4 interspace and was able to obtain CSF return without difficulty.   (including critical care time) CRITICAL CARE Performed by: Arby Barrette   Total critical care time: 60 minutes  Critical care time was exclusive of separately billable procedures and treating other patients.  Critical care was necessary to treat or prevent imminent or life-threatening deterioration.  Critical care was time spent personally by me on the following activities:  development of treatment plan with patient and/or surrogate as well as nursing, discussions with consultants, evaluation of patient's response to treatment, examination of patient, obtaining history from patient or surrogate, ordering and performing treatments and interventions, ordering and review of laboratory studies, ordering and review of radiographic studies, pulse oximetry and re-evaluation of patient's condition. Medications Ordered in ED Medications  LORazepam (ATIVAN) 2 MG/ML injection (has no administration in time range)  LORazepam (ATIVAN) 2 MG/ML injection (2 mg  Given 05/03/19 0708)  LORazepam (ATIVAN) injection 2 mg (2 mg Intravenous Given 05/03/19 0727)  LORazepam (ATIVAN) injection 1 mg (1 mg Intravenous Given 05/03/19 0747)  levETIRAcetam (KEPPRA)  2,000 mg in sodium chloride 0.9 % 100 mL IVPB (0 mg Intravenous Stopped 05/03/19 0810)  LORazepam (ATIVAN) injection 1 mg (1 mg Intravenous Given 05/03/19 0711)  LORazepam (ATIVAN) injection 1 mg (1 mg Intravenous Given 05/03/19 0947)     Initial Impression / Assessment and Plan / ED Course  I have reviewed the triage vital signs and the nursing notes.  Pertinent labs & imaging results that were available during my care of the patient were reviewed by me and considered in my medical decision making (see chart for details).  Clinical Course as of May 03 1115  Fri May 03, 2019  1027 Dr. Vassie LollAlva consulted for intensivist.  Will evaluate in the emergency department for admission.   [MP]    Clinical Course User Index [MP] Arby BarrettePfeiffer, Mir Fullilove, MD       Consult: Reviewed with Dr. Wilford CornerArora Advises to give Keppra load.  Will see patient ASAP in the emergency department.  Patient presents with new onset seizure activity and combativeness.  Patient has complex medical history with prior basal ganglia stroke and significant left-sided pre-existing deficits.  Patient was confused and repeatedly removing his monitor leads trying to get out of the bed and resisting treatment.  He had appearance of encephalopathy.  DDx included post ictal state, new stroke, medication reaction, withdrawal syndrome, infectious etiology.  Neurology was consulted and has evaluated the patient.  CT scan did not show any new acute bleed or other apparent new findings.  EEG is not showing active seizure.  Patient has required 8 mg of Ativan for sedation.  Lower suspicion for infectious etiology.  Patient was well yesterday at baseline per his wife.  She is noted no changes or signs of illness recently.  This started quite abruptly with seizure activity this morning.  Patient was able to answer simple questions after arrival to the emergency department although remained confused and combative.  Dr. Jerrell BelfastAurora requests LP and MRI after patient has  been adequately sedated and will continue to evaluate.  Patient is being seen by critical care team.  At this point will need decision-making around further sedation needs.  Patient is protecting his airway well and maintaining oxygen saturation and normal blood pressures.  However, he will start to remove all monitoring devices and become agitated with stimulation despite heavy sedation.  Critical care to determine if patient is appropriate for Precedex sedation versus intubation and sedation.  Final Clinical Impressions(s) / ED Diagnoses   Final diagnoses:  New onset seizure (HCC)  History of hemorrhagic stroke with residual hemiplegia (HCC)  Encephalopathy acute    ED Discharge Orders    None       Arby BarrettePfeiffer, Gerardo Territo, MD 05/03/19 1116    Arby BarrettePfeiffer, Prima Rayner, MD 05/03/19 1351

## 2019-05-03 NOTE — ED Triage Notes (Signed)
Patient in via [facility] from home after witnessed seizure-like activity by wife. Patient was in bed when wife saw him exhibiting full body tonic-clonic movements, lasting approximately 2 minutes. No history of seizures. 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 Per wife, patient has L arm weakness but his residual L leg weakness had improved after therapy and he ambulates on own using walker. Patient postictal when EMS arrived - combative and confused, not following commands and flailing legs and R arm. No injury to tongue noted, but urinary and fecal incontinence noted on ED arrival. Wife now at bedside.

## 2019-05-03 NOTE — Progress Notes (Signed)
EEG completed-generalized slowing.  No sharps.  No seizures.  Patient continues to be agitated.  Moving both sides, left weaker than right.  Discussed with ED provider Dr. Vallery Ridge.  She would prefer to have some sedation like Precedex on at this time.  I do not have a clear explanation of his presentation and symptoms.  Imaging is normal, EEG is unremarkable for any focality.  Due to the sudden onset of this encephalopathy, a CNS infection should be ruled out.  Updated recommendations: -Sedation per ER - Spinal tap to look for glucose, CSF protein, cytology, cell count and HSV PCR and Gram stain.  -- Amie Portland, MD [facility] Pager: [phone] If 7pm to 7am, please call on call as listed on AMION.

## 2019-05-03 NOTE — Progress Notes (Signed)
EEG completed, results pending. 

## 2019-05-03 NOTE — Consult Note (Signed)
Neurology Consultation  Reason for Consult: Seizure Referring Physician: Dr. Johnney Killian  CC: seizure  History is obtained from: Chart, wife  HPI: Jon [name] is a 63 y.o. male PMH of left hemiparesis requiring baclofen and Botox injections from a right BG ICH in 2017,  HTN, Etoh abuse, tobacco abuse, presenting to ER when he had a seizure this AM. He went to bed normal and around 4 am, had seizure activity of left side, which led to the bed shaking and woke the wife up. He was appearing confused at the time but was able to ask the wife to call 911. He was brought into the emergency room, where he was combative answering some questions inconsistently.  He was given a total of 6 mg of IV Ativan and taken to the scanner.  Urgent neurological consultation was obtained for seizure, status epilepticus and concern for stroke. I saw him in the CAT scanner.  He was continuing to be combative.  He needed quired 1 more additional milligram of Ativan making a total of 7 mg of Ativan. My exam as documented below. Wife denies any preceding illnesses sicknesses fevers chills or exposure to any sick individuals. He is not complaining of any headaches.  No visual changes. He takes his medications regularly including baclofen for spasticity   ROS: Wife provided.  Unable to obtain from patient due to altered mental status.  Pertinent positives in the HPI.  Rest of the review negative.  Past Medical History:  Diagnosis Date  . Hypertension   . Liver damage   . Stroke [facility])     No family history on file. No family history of strokes  Social History:  Was a heavy smoker. Has alcohol abuse history.  Unclear if he is still abusing both   Medications  Current Facility-Administered Medications:  .  levETIRAcetam (KEPPRA) IVPB 1000 mg/100 mL premix, 1,000 mg, Intravenous, Once, Pfeiffer, Marcy, MD .  levETIRAcetam (KEPPRA) IVPB 1000 mg/100 mL premix, 1,000 mg, Intravenous, STAT, Amie Portland, MD .   LORazepam (ATIVAN) 2 MG/ML injection, , , ,  .  LORazepam (ATIVAN) 2 MG/ML injection, , , ,  .  LORazepam (ATIVAN) injection 1 mg, 1 mg, Intravenous, Once, Amie Portland, MD .  LORazepam (ATIVAN) injection 2 mg, 2 mg, Intravenous, Once, Charlesetta Shanks, MD  Current Outpatient Medications:  .  aspirin EC 81 MG tablet, Take 1 tablet (81 mg total) by mouth daily., Disp: , Rfl:  .  baclofen (LIORESAL) 20 MG tablet, TAKE 1 TABLET BY MOUTH FOUR TIMES DAILY, Disp: 360 tablet, Rfl: 0 .  diclofenac sodium (VOLTAREN) 1 % GEL, Apply 2 g topically 4 (four) times daily. (Patient taking differently: Apply 2 g topically as needed. ), Disp: 1 Tube, Rfl: 1 .  folic acid (FOLVITE) 1 MG tablet, Take 1 tablet (1 mg total) by mouth daily., Disp: 30 tablet, Rfl: 0 .  metoprolol tartrate (LOPRESSOR) 50 MG tablet, Take 25 mg by mouth 2 (two) times daily., Disp: , Rfl:  .  Multiple Vitamin (MULTIVITAMIN WITH MINERALS) TABS tablet, Take 1 tablet by mouth daily., Disp: , Rfl:  .  pantoprazole (PROTONIX) 40 MG tablet, Take 1 tablet (40 mg total) by mouth daily., Disp: 30 tablet, Rfl: 1 .  pravastatin (PRAVACHOL) 40 MG tablet, Take 1 tablet (40 mg total) by mouth daily at 6 PM., Disp: 30 tablet, Rfl: 1 .  sertraline (ZOLOFT) 50 MG tablet, Take 1 tablet (50 mg total) by mouth daily., Disp: 30 tablet, Rfl: 1 .  Tadalafil (CIALIS PO), Take by mouth 1 day or 1 dose. , Disp: , Rfl:   Facility-Administered Medications Ordered in Other Encounters:  .  gadopentetate dimeglumine (MAGNEVIST) injection 15 mL, 15 mL, Intravenous, Once PRN, Marvel Plan, MD  Exam: Current vital signs: BP 108/65   Pulse (!) 101   Resp (!) 23   SpO2 97%  Vital signs in last 24 hours: Pulse Rate:  [101-113] 101 (10/09 0716) Resp:  [23] 23 (10/09 0711) BP: (108-126)/(65-66) 108/65 (10/09 0715) SpO2:  [97 %-98 %] 97 % (10/09 0716) General: Drowsy, moaning and uncomfortable/combative HEENT: Cephalic atraumatic CVS: Regular rate rhythm Respiratory:  Breathing normally saturating well on room air Abdomen: Nondistended nontender Extremities warm well perfused Neurological exam Drowsy, moaning and uncomfortable. Was able to tell me his name. Was able to say he is not in pain with a normal when asked a direct question. Speech was mildly dysarthric while saying his name and saying yes and no. Very poor attention concentration Keeps his eyes closed and fights the attempt to open his eyes. Cannot assess for naming comprehension and repetition because of the above Cranial nerves: Pupils were 5 mm, briskly reactive to light, blinks to threat from both sides, there is mild left nasolabial fold flattening. Motor exam: Moving the right side with full strength in the upper and lower extremity.  On the left side, there is increased tone and definite weakness as witnessed by weak withdrawal to noxious stimulation. Sensory exam: Appeared intact and responds to moaning and grimacing and withdrawal to noxious stimulation. Coordination cannot be assessed due to mentation Gait cannot be assessed due to mentation    Labs I have reviewed labs in epic and the results pertinent to this consultation are:   CBC    Component Value Date/Time   WBC 10.6 (H) 05/03/2019 0707   RBC 4.77 05/03/2019 0707   HGB 15.6 05/03/2019 0721   HCT 46.0 05/03/2019 0721   PLT 191 05/03/2019 0707   MCV 96.2 05/03/2019 0707   MCH 31.7 05/03/2019 0707   MCHC 32.9 05/03/2019 0707   RDW 12.7 05/03/2019 0707   LYMPHSABS 3.5 05/03/2019 0707   MONOABS 1.1 (H) 05/03/2019 0707   EOSABS 0.3 05/03/2019 0707   BASOSABS 0.1 05/03/2019 0707    CMP     Component Value Date/Time   NA 141 05/03/2019 0721   K 3.8 05/03/2019 0721   CL 107 05/03/2019 0721   CO2 16 (L) 05/03/2019 0707   GLUCOSE 155 (H) 05/03/2019 0721   BUN 7 (L) 05/03/2019 0721   CREATININE 0.70 05/03/2019 0721   CALCIUM 9.1 05/03/2019 0707   PROT 7.3 05/03/2019 0707   ALBUMIN 4.2 05/03/2019 0707   AST 22  05/03/2019 0707   ALT 14 05/03/2019 0707   ALKPHOS 53 05/03/2019 0707   BILITOT 1.1 05/03/2019 0707   GFRNONAA >60 05/03/2019 0707   GFRAA >60 05/03/2019 0707    Imaging I have reviewed the images obtained:  CT-scan of the brain-no acute changes.  Mild encephalomalacia around the site of the old bleed.  Assessment: 63 year old man with a history of right basal ganglia hemorrhage in 2017 with residual left hemiparesis requiring baclofen and Botox for spasticity, hypertension, history of alcohol and tobacco abuse, presenting with sudden onset of seizure-like activity that started somewhere around 4 AM while he was sleeping.  He was able to ask his wife to call for help but appeared confused combative and agitated. In the ER, he was given Ativan and was  not appearing to clinically be having a seizure. Likely has had a seizure secondary to multiple factors- old stroke although it was subcortical could make him susceptible to have a seizure/baclofen-which can lower seizure threshold/history of alcohol abuse-unclear if there is any component of withdrawal.  Impression: New onset seizure Status epilepticus Evaluate for alcohol withdrawal  Recommendations: Load with Keppra 2 g IV Continue Keppra 500 twice daily Seizure precautions MRI of the brain when possible Stat EEG-ordered and technologist informed. Check urine toxicology screen and blood ethanol levels Check urinalysis chest x-ray.  We will follow after the EEG.  If continues to worsen and requires intubation, will require ICU admission otherwise I would recommend a stepdown admission.  Relayed my plan to Dr. Clarice PolePfeifer  -- Milon DikesAshish Trevaris Pennella, MD [facility] Pager: [phone] If 7pm to 7am, please call on call as listed on AMION.  CRITICAL CARE ATTESTATION Performed by: Milon DikesAshish Demont Linford, MD Total critical care time: 40 minutes Critical care time was exclusive of separately billable procedures and treating other patients  and/or supervising APPs/Residents/Students Critical care was necessary to treat or prevent imminent or life-threatening deterioration due to new onset seizure, status epilepticus This patient is critically ill and at significant risk for neurological worsening and/or death and care requires constant monitoring. Critical care was time spent personally by me on the following activities: development of treatment plan with patient and/or surrogate as well as nursing, discussions with consultants, evaluation of patient's response to treatment, examination of patient, obtaining history from patient or surrogate, ordering and performing treatments and interventions, ordering and review of laboratory studies, ordering and review of radiographic studies, pulse oximetry, re-evaluation of patient's condition, participation in multidisciplinary rounds and medical decision making of high complexity in the care of this patient.

## 2019-05-03 NOTE — Progress Notes (Signed)
  CSF unremarkable for infection.  Glucose mildly elevated but has hyperglycemia on blood tests.  Protein just out of normal range at 47. These findings could just be secondary to a seizure. Bacterial meningitis very unlikely and so is viral encephalitis with the protein and cell count. I will wait for the HSV PCR but I do not think he needs to be on acyclovir at this time but the CSF profile.  Likely prolonged postictal phase. Admitted to [facility].  We will continue to follow  -- Amie Portland, MD [facility] Pager: [phone] If 7pm to 7am, please call on call as listed on AMION.

## 2019-05-03 NOTE — ED Notes (Signed)
MD at bedside performing LP.

## 2019-05-03 NOTE — Discharge Instructions (Signed)
Epilepsy Epilepsy is when a person keeps having seizures. A seizure is a burst of abnormal activity in the brain. A seizure can change how you think or behave, and it can make it hard to be aware of what is happening. This condition can cause problems such as:  Falls, accidents, and injury.  Sadness (depression).  Poor memory.  Sudden unexplained death in epilepsy (SUDEP). This is rare. Its cause is not known. Most people with epilepsy lead normal lives. What are the causes? This condition may be caused by:  A head injury.  An injury that happens at birth.  A high fever during childhood.  A stroke.  Bleeding that goes into or around the brain.  Certain medicines and drugs.  Having too little oxygen for a long period of time.  Abnormal brain development.  Certain infections.  Brain tumors.  Conditions that are passed from parent to child (are hereditary). What are the signs or symptoms? Symptoms of a seizure vary from person to person. They may include:  Jerky movements of muscles (convulsions).  Stiffening of the body.  Movements of the arms or legs that you are not able to control.  Passing out (loss of consciousness).  Breathing problems.  Sudden falls.  Confusion.  Head nodding.  Eye blinking or twitching.  Lip smacking.  Drooling.  Fast eye movements.  Grunting.  Not being able to control when you pee or poop.  Staring.  Being hard to wake up (unresponsiveness). Some people have symptoms right before a seizure happens (aura) and right after a seizure happens. These symptoms include:  Fear or anxiety.  Feeling sick to your stomach (nauseous).  Feeling like the room is spinning (vertigo).  A feeling of having seen or heard something before (dj vu).  Odd tastes or smells.  Changes in how you see (vision), such as seeing flashing lights or spots. Symptoms that follow a seizure include:  Being confused.  Being sleepy.  Having a  headache. How is this treated? Treatment can control seizures. Treatment for this condition may involve:  Taking medicines to control seizures.  Having a device (vagus nerve stimulator) put in the chest. The device sends signals to a nerve and to the brain to prevent seizures.  Brain surgery to stop seizures from happening or to reduce how often they happen.  Having blood tests often to make sure you are getting the right amount of medicine. Once this condition has been diagnosed, it is important to start treatment as soon as possible. For some people, epilepsy goes away in time. Others will need treatment for the rest of their life. Follow these instructions at home: Medicines  Take over-the-counter and prescription medicines only as told by your doctor.  Avoid anything that may keep your medicine from working, such as alcohol. Activity  Get enough rest. Lack of sleep can make seizures more likely to occur.  Follow your doctor's advice about driving, swimming, and doing anything else that would be dangerous if you had a seizure. ? If you live in the [place] check with your local DMV (department of motor vehicles) to find out about local driving laws. Each state has rules about when you can return to driving. Teaching others Teach friends and family what to do if you have a seizure. They should:  Lay you on the ground to prevent a fall.  Cushion your head and body.  Loosen any tight clothing around your neck.  Turn you on your side.  Stay with  you until you are better.  Not hold you down.  Not put anything in your mouth.  Know whether or not you need emergency care.  General instructions  Avoid anything that causes you to have seizures.  Keep a seizure diary. Write down what you remember about each seizure. Be sure to include what might have caused it.  Keep all follow-up visits as told by your doctor. This is important. Contact a doctor if:  You have a change in how  often or when you have seizures.  You get an infection or start to feel sick. You may have more seizures when you are sick. Get help right away if:  A seizure does not stop after 5 minutes.  You have more than one seizure in a row, and you do not have enough time between the seizures to feel better.  A seizure makes it harder to breathe.  A seizure is different from other seizures you have had.  A seizure makes you unable to speak or use a part of your body.  You did not wake up right after a seizure. These symptoms may be an emergency. Do not wait to see if the symptoms will go away. Get medical help right away. Call your local emergency services (911 in the [place] Do not drive yourself to the hospital. Summary  Epilepsy is when a person keeps having seizures. A seizure is a burst of abnormal activity in the brain.  Treatment can control seizures.  Seizure, Adult A seizure is a sudden burst of abnormal electrical activity in the brain. Seizures usually last from 30 seconds to 2 minutes. They can cause many different symptoms. Usually, seizures are not harmful unless they last a long time. What are the causes? Common causes of this condition include: Fever or infection. Conditions that affect the brain, such as: A brain abnormality that you were born with. A brain or head injury. Bleeding in the brain. A tumor. Stroke. Brain disorders such as autism or cerebral palsy. Low blood sugar. Conditions that are passed from parent to child (are inherited). Problems with substances, such as: Having a reaction to a drug or a medicine. Suddenly stopping the use of a substance (withdrawal). In some cases, the cause may not be known. A person who has repeated seizures over time without a clear cause has a condition called epilepsy. What increases the risk? You are more likely to get this condition if you have: A family history of epilepsy. Had a seizure in the past. A brain  disorder. A history of head injury, lack of oxygen at birth, or strokes. What are the signs or symptoms? There are many types of seizures. The symptoms vary depending on the type of seizure you have. Examples of symptoms during a seizure include: Shaking (convulsions). Stiffness in the body. Passing out (losing consciousness). Head nodding. Staring. Not responding to sound or touch. Loss of bladder control and bowel control. Some people have symptoms right before and right after a seizure happens. Symptoms before a seizure may include: Fear. Worry (anxiety). Feeling like you may vomit (nauseous). Feeling like the room is spinning (vertigo). Feeling like you saw or heard something before (dj vu). Odd tastes or smells. Changes in how you see. You may see flashing lights or spots. Symptoms after a seizure happens can include: Confusion. Sleepiness. Headache. Weakness on one side of the body. How is this treated? Most seizures will stop on their own in under 5 minutes. In these cases, no  treatment is needed. Seizures that last longer than 5 minutes will usually need treatment. Treatment can include: Medicines given through an IV tube. Avoiding things that are known to cause your seizures. These can include medicines that you take for another condition. Medicines to treat epilepsy. Surgery to stop the seizures. This may be needed if medicines do not help. Follow these instructions at home: Medicines Take over-the-counter and prescription medicines only as told by your doctor. Do not eat or drink anything that may keep your medicine from working, such as alcohol. Activity Do not do any activities that would be dangerous if you had another seizure, like driving or swimming. Wait until your doctor says it is safe for you to do them. If you live in the [place] ask your local DMV (department of motor vehicles) when you can drive. Get plenty of rest. Teaching others Teach friends and  family what to do when you have a seizure. They should: Lay you on the ground. Protect your head and body. Loosen any tight clothing around your neck. Turn you on your side. Not hold you down. Not put anything into your mouth. Know whether or not you need emergency care. Stay with you until you are better.  General instructions Contact your doctor each time you have a seizure. Avoid anything that gives you seizures. Keep a seizure diary. Write down: What you think caused each seizure. What you remember about each seizure. Keep all follow-up visits as told by your doctor. This is important. Contact a doctor if: You have another seizure. You have seizures more often. There is any change in what happens during your seizures. You keep having seizures with treatment. You have symptoms of being sick or having an infection. Get help right away if: You have a seizure that: Lasts longer than 5 minutes. Is different than seizures you had before. Makes it harder to breathe. Happens after you hurt your head. You have any of these symptoms after a seizure: Not being able to speak. Not being able to use a part of your body. Confusion. A bad headache. You have two or more seizures in a row. You do not wake up right after a seizure. You get hurt during a seizure. These symptoms may be an emergency. Do not wait to see if the symptoms will go away. Get medical help right away. Call your local emergency services (911 in the [place] Do not drive yourself to the hospital. Summary Seizures usually last from 30 seconds to 2 minutes. Usually, they are not harmful unless they last a long time. Do not eat or drink anything that may keep your medicine from working, such as alcohol. Teach friends and family what to do when you have a seizure. Contact your doctor each time you have a seizure. This information is not intended to replace advice given to you by your health care provider. Make sure you  discuss any questions you have with your health care provider. Document Released: 12/28/2007 Document Revised: 09/28/2018 Document Reviewed: 09/28/2018 Elsevier Patient Education  2020 [employer].    Teach friends and family what to do if you have a seizure. This information is not intended to replace advice given to you by your health care provider. Make sure you discuss any questions you have with your health care provider. Document Released: 05/08/2009 Document Revised: 03/05/2018 Document Reviewed: 03/05/2018 Elsevier Patient Education  2020 [employer].

## 2019-05-03 NOTE — ED Notes (Signed)
EEG setting up at bedside.

## 2019-05-03 NOTE — Telephone Encounter (Signed)
Haytham Maher wanted to let Dr. Letta Pate know that Hebert was admitted to hospital for seizures.

## 2019-05-03 NOTE — ED Notes (Addendum)
ED TO INPATIENT HANDOFF REPORT  ED Nurse Name and Phone #:  Marsh Dolly [phone]  S Name/Age/Gender Jon [name] 63 y.o. male Room/Bed: RESUSC/RESUSC  Code Status   Code Status: Full Code  Home/SNF/Other Home Patient oriented to: self and place Is this baseline? No   Triage Complete: Triage complete  Chief Complaint seizures  Triage Note Ativan 1 mg IV per verbal order Dr. Donnald Garre given by Anell Barr, RN  Patient in via [facility] from home after witnessed seizure-like activity by wife. Patient was in bed when wife saw him exhibiting full body tonic-clonic movements, lasting approximately 2 minutes. No history of seizures. 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 Per wife, patient has L arm weakness but his residual L leg weakness had improved after therapy and he ambulates on own using walker. Patient postictal when EMS arrived - combative and confused, not following commands and flailing legs and R arm. No injury to tongue noted, but urinary and fecal incontinence noted on ED arrival. Wife now at bedside.    Allergies Allergies  Allergen Reactions  . Gluten Meal Nausea And Vomiting  . Lactose Intolerance (Gi) Nausea And Vomiting  . Other     Tree nuts   . Shellfish Allergy Swelling and Rash    Level of Care/Admitting Diagnosis ED Disposition    ED Disposition Condition Comment   Admit  Hospital Area: [facility] [[id number]]  Level of Care: ICU [6]  Covid Evaluation: N/A  Diagnosis: Seizure (HCC) [205090]  Admitting Physician: Oretha Milch [[id number]]  Attending Physician: Cyril Mourning V [[id number]]  Estimated length of stay: 3 - 4 days  Certification:: I certify this patient will need inpatient services for at least 2 midnights  PT Class (Do Not Modify): Inpatient [101]  PT Acc Code (Do Not Modify): Private [1]       B Medical/Surgery History Past Medical History:  Diagnosis Date  . Hypertension   . Liver damage    . Stroke [facility])    Past Surgical History:  Procedure Laterality Date  . TONSILLECTOMY AND ADENOIDECTOMY Bilateral    Age 89     A IV Location/Drains/Wounds Patient Lines/Drains/Airways Status   Active Line/Drains/Airways    Name:   Placement date:   Placement time:   Site:   Days:   Peripheral IV 05/03/19 Left Forearm   05/03/19    0704    Forearm   less than 1   Wound / Incision (Open or Dehisced) 01/14/16 Other (Comment) Arm Left Old IV site removed- greenish drainage and slough noted   01/14/16    1639    Arm   1205          Intake/Output Last 24 hours  Intake/Output Summary (Last 24 hours) at 05/03/2019 1237 Last data filed at 05/03/2019 0810 Gross per 24 hour  Intake 200 ml  Output -  Net 200 ml    Labs/Imaging Results for orders placed or performed during the hospital encounter of 05/03/19 (from the past 48 hour(s))  Protime-INR     Status: None   Collection Time: 05/03/19  7:07 AM  Result Value Ref Range   Prothrombin Time 13.3 11.4 - 15.2 seconds   INR 1.0 0.8 - 1.2    Comment: (NOTE) INR goal varies based on device and disease states. Performed at [facility], [address], [place], [place] [address]   APTT     Status: None   Collection  Time: 05/03/19  7:07 AM  Result Value Ref Range   aPTT 26 24 - 36 seconds    Comment: Performed at [facility], [address] [place], [place] [address]  CBC     Status: Abnormal   Collection Time: 05/03/19  7:07 AM  Result Value Ref Range   WBC 10.6 (H) 4.0 - 10.5 K/uL   RBC 4.77 4.22 - 5.81 MIL/uL   Hemoglobin 15.1 13.0 - 17.0 g/dL   HCT 19.145.9 47.839.0 - 29.552.0 %   MCV 96.2 80.0 - 100.0 fL   MCH 31.7 26.0 - 34.0 pg   MCHC 32.9 30.0 - 36.0 g/dL   RDW 62.112.7 30.811.5 - 65.715.5 %   Platelets 191 150 - 400 K/uL   nRBC 0.0 0.0 - 0.2 %    Comment: Performed at [facility], [address] [place], [place] [address]  Differential     Status: Abnormal   Collection Time: 05/03/19  7:07 AM  Result Value Ref Range    Neutrophils Relative % 53 %   Neutro Abs 5.6 1.7 - 7.7 K/uL   Lymphocytes Relative 33 %   Lymphs Abs 3.5 0.7 - 4.0 K/uL   Monocytes Relative 11 %   Monocytes Absolute 1.1 (H) 0.1 - 1.0 K/uL   Eosinophils Relative 2 %   Eosinophils Absolute 0.3 0.0 - 0.5 K/uL   Basophils Relative 1 %   Basophils Absolute 0.1 0.0 - 0.1 K/uL   Immature Granulocytes 0 %   Abs Immature Granulocytes 0.03 0.00 - 0.07 K/uL    Comment: Performed at [facility], [address] [place], [place] [address]  Comprehensive metabolic panel     Status: Abnormal   Collection Time: 05/03/19  7:07 AM  Result Value Ref Range   Sodium 140 135 - 145 mmol/L   Potassium 3.9 3.5 - 5.1 mmol/L   Chloride 103 98 - 111 mmol/L   CO2 16 (L) 22 - 32 mmol/L   Glucose, Bld 161 (H) 70 - 99 mg/dL   BUN 6 (L) 8 - 23 mg/dL   Creatinine, Ser 4.130.92 0.61 - 1.24 mg/dL   Calcium 9.1 8.9 - 24.410.3 mg/dL   Total Protein 7.3 6.5 - 8.1 g/dL   Albumin 4.2 3.5 - 5.0 g/dL   AST 22 15 - 41 U/L   ALT 14 0 - 44 U/L   Alkaline Phosphatase 53 38 - 126 U/L   Total Bilirubin 1.1 0.3 - 1.2 mg/dL   GFR calc non Af Amer >60 >60 mL/min   GFR calc Af Amer >60 >60 mL/min   Anion gap 21 (H) 5 - 15    Comment: Performed at [facility], [address] [place], [place] [address]  CK     Status: Abnormal   Collection Time: 05/03/19  7:07 AM  Result Value Ref Range   Total CK 41 (L) 49 - 397 U/L    Comment: Performed at [facility], [address] [place], [place] [address]  Magnesium     Status: None   Collection Time: 05/03/19  7:07 AM  Result Value Ref Range   Magnesium 2.0 1.7 - 2.4 mg/dL    Comment: Performed at [facility], [address] [place], [place] [address]  Phosphorus     Status: None   Collection Time: 05/03/19  7:07 AM  Result Value Ref Range   Phosphorus 2.9 2.5 - 4.6 mg/dL  Comment: Performed at [facility], [address], [place], [place] [address]  TSH     Status: None   Collection Time:  05/03/19  7:15 AM  Result Value Ref Range   TSH 3.682 0.350 - 4.500 uIU/mL    Comment: Performed by a 3rd Generation assay with a functional sensitivity of <=0.01 uIU/mL. Performed at [facility], [address], [place], [place] [address]   I-stat chem 8, ED     Status: Abnormal   Collection Time: 05/03/19  7:21 AM  Result Value Ref Range   Sodium 141 135 - 145 mmol/L   Potassium 3.8 3.5 - 5.1 mmol/L   Chloride 107 98 - 111 mmol/L   BUN 7 (L) 8 - 23 mg/dL   Creatinine, Ser 1.91 0.61 - 1.24 mg/dL   Glucose, Bld 478 (H) 70 - 99 mg/dL   Calcium, Ion 2.95 (L) 1.15 - 1.40 mmol/L   TCO2 18 (L) 22 - 32 mmol/L   Hemoglobin 15.6 13.0 - 17.0 g/dL   HCT 62.1 30.8 - 65.7 %  Urine rapid drug screen (hosp performed)     Status: None   Collection Time: 05/03/19  8:07 AM  Result Value Ref Range   Opiates NONE DETECTED NONE DETECTED   Cocaine NONE DETECTED NONE DETECTED   Benzodiazepines NONE DETECTED NONE DETECTED   Amphetamines NONE DETECTED NONE DETECTED   Tetrahydrocannabinol NONE DETECTED NONE DETECTED   Barbiturates NONE DETECTED NONE DETECTED    Comment: (NOTE) DRUG SCREEN FOR MEDICAL PURPOSES ONLY.  IF CONFIRMATION IS NEEDED FOR ANY PURPOSE, NOTIFY LAB WITHIN 5 DAYS. LOWEST DETECTABLE LIMITS FOR URINE DRUG SCREEN Drug Class                     Cutoff (ng/mL) Amphetamine and metabolites    1000 Barbiturate and metabolites    200 Benzodiazepine                 200 Tricyclics and metabolites     300 Opiates and metabolites        300 Cocaine and metabolites        300 THC                            50 Performed at [facility], [address] [place], [place] [address]   Ammonia     Status: None   Collection Time: 05/03/19  8:07 AM  Result Value Ref Range   Ammonia 27 9 - 35 umol/L    Comment: Performed at [facility], [address], [place], [place] [address]  Urinalysis, Routine w reflex microscopic     Status: Abnormal   Collection Time: 05/03/19   8:10 AM  Result Value Ref Range   Color, Urine YELLOW YELLOW   APPearance CLEAR CLEAR   Specific Gravity, Urine 1.018 1.005 - 1.030   pH 5.0 5.0 - 8.0   Glucose, UA NEGATIVE NEGATIVE mg/dL   Hgb urine dipstick SMALL (A) NEGATIVE   Bilirubin Urine NEGATIVE NEGATIVE   Ketones, ur 5 (A) NEGATIVE mg/dL   Protein, ur 30 (A) NEGATIVE mg/dL   Nitrite NEGATIVE NEGATIVE   Leukocytes,Ua NEGATIVE NEGATIVE   RBC / HPF 0-5 0 - 5 RBC/hpf   WBC, UA 11-20 0 - 5 WBC/hpf   Bacteria, UA FEW (A) NONE SEEN   Squamous Epithelial / LPF 0-5 0 - 5   Mucus  PRESENT    Hyaline Casts, UA PRESENT     Comment: Performed at [facility], [address], [place], [place] [address]   Ct Head Wo Contrast  Result Date: 05/03/2019 CLINICAL DATA:  Altered mental status.  Seizure-like activity. EXAM: CT HEAD WITHOUT CONTRAST TECHNIQUE: Contiguous axial images were obtained from the base of the skull through the vertex without intravenous contrast. COMPARISON:  January 08 2016 FINDINGS: Brain: No evidence of acute infarction, hemorrhage, hydrocephalus, extra-axial collection or mass lesion/mass effect. There is mild chronic diffuse atrophy. Vascular: No hyperdense vessel is noted. Skull: Normal. Negative for fracture or focal lesion. Sinuses/Orbits: Small right maxillary sinus retention cyst is identified. The other visualized sinuses are clear. The orbits are normal. Other: None. IMPRESSION: No focal acute intracranial abnormality identified. Chronic diffuse atrophy. Electronically Signed   By: Abelardo Diesel M.D.   On: 05/03/2019 08:02   Dg Chest Port 1 View  Result Date: 05/03/2019 CLINICAL DATA:  Altered mental status, seizure EXAM: PORTABLE CHEST 1 VIEW COMPARISON:  01/09/2016 FINDINGS: Cardiomegaly. Both lungs are clear. The visualized skeletal structures are unremarkable. IMPRESSION: Cardiomegaly without acute abnormality of the lungs in AP portable projection. Electronically Signed   By: Eddie Candle M.D.   On:  05/03/2019 09:47    Pending Labs Unresulted Labs (From admission, onward)    Start     Ordered   05/04/19 0500  CBC  Tomorrow morning,   R     05/03/19 1200   05/04/19 0500  Phosphorus  Tomorrow morning,   R     05/03/19 1200   05/04/19 6962  Basic metabolic panel  Tomorrow morning,   R     05/03/19 1200   05/04/19 0500  Magnesium  Tomorrow morning,   R     05/03/19 1200   05/03/19 1214  Urine culture  ONCE - STAT,   STAT     05/03/19 1213   05/03/19 1157  HIV Antibody (routine testing w rflx)  (HIV Antibody (Routine testing w reflex) panel)  Once,   STAT     05/03/19 1200   05/03/19 1157  HIV4GL Save Tube  (HIV Antibody (Routine testing w reflex) panel)  Once,   STAT     05/03/19 1200   05/03/19 1157  CBC  (heparin)  Once,   STAT    Comments: Baseline for heparin therapy IF NOT ALREADY DRAWN.  Notify MD if PLT < 100 K.    05/03/19 1200   05/03/19 1157  Creatinine, serum  (heparin)  Once,   STAT    Comments: Baseline for heparin therapy IF NOT ALREADY DRAWN.    05/03/19 1200   05/03/19 0718  SARS CORONAVIRUS 2 (TAT 6-24 HRS) Nasopharyngeal Nasopharyngeal Swab  (Asymptomatic/Tier 2 Patients Labs)  Once,   STAT    Question Answer Comment  Is this test for diagnosis or screening Screening   Symptomatic for COVID-19 as defined by CDC No   Hospitalized for COVID-19 No   Admitted to ICU for COVID-19 No   Previously tested for COVID-19 No   Resident in a congregate (group) care setting No   Employed in healthcare setting No      05/03/19 0717   05/03/19 0707  Ethanol  ONCE - STAT,   STAT     05/03/19 0707          Vitals/Pain Today's Vitals   05/03/19 1145 05/03/19 1200 05/03/19 1215 05/03/19 1230  BP: 110/78  (!) 144/90 125/71  Pulse: (!) 101  (!) 114 (!) 102  Resp: (!) 23  (!) 22   Temp:      TempSrc:      SpO2: 94%  98% 97%  Weight:  68 kg      Isolation Precautions No active isolations  Medications Medications  LORazepam (ATIVAN) 2 MG/ML injection (has no  administration in time range)  dexmedetomidine (PRECEDEX) 200 MCG/50ML (4 mcg/mL) infusion (0.4 mcg/kg/hr  68 kg Intravenous New Bag/Given 05/03/19 1227)  heparin injection 5,000 Units (has no administration in time range)  0.9 %  sodium chloride infusion ( Intravenous New Bag/Given 05/03/19 1224)  acetaminophen (TYLENOL) tablet 650 mg (has no administration in time range)  ondansetron (ZOFRAN) injection 4 mg (has no administration in time range)  pantoprazole (PROTONIX) injection 40 mg (has no administration in time range)  LORazepam (ATIVAN) 2 MG/ML injection (2 mg  Given 05/03/19 0708)  LORazepam (ATIVAN) injection 2 mg (2 mg Intravenous Given 05/03/19 0727)  LORazepam (ATIVAN) injection 1 mg (1 mg Intravenous Given 05/03/19 0747)  levETIRAcetam (KEPPRA) 2,000 mg in sodium chloride 0.9 % 100 mL IVPB (0 mg Intravenous Stopped 05/03/19 0810)  LORazepam (ATIVAN) injection 1 mg (1 mg Intravenous Given 05/03/19 0711)  LORazepam (ATIVAN) injection 1 mg (1 mg Intravenous Given 05/03/19 0947)    Mobility walks with device     Focused Assessments Neuro Assessment Handoff:  Swallow screen pass? No  Cardiac Rhythm: Sinus tachycardia       Neuro Assessment:   Neuro Checks:      Last Documented NIHSS Modified Score:   Has TPA been given? No If patient is a Neuro Trauma and patient is going to OR before floor call report to 4N Charge nurse: [phone] or [phone]     R Recommendations: See Admitting Provider Note  Report given to: Felicia, RN [facility]  Additional Notes:  Patient reported to have witnessed seizure while laying in bed this morning - wife noted full body movement that lasted about 2 minutes. No injury to tongue, but incontinent of urine and stool. Patient's wife at bedside, very involved in care and good historian. States he had hemorrhagic stroke 3 years ago with residual L sided deficits - continues to have L arm weakness, but L leg weakness had improved greatly with  therapy and patient ambulates with cane normally.   Patient agitated and combative - currently in soft restraints (bilateral ankles and wrists) as patient constantly flailing arms and kicking legs at staff, pulling vital sign equipment and IV lines. Patient was alert and oriented x 2 on arrival, following minimal commands. With more stimulation, patient becomes increasingly agitated.   Starting Precedex in ED to attempt LP, but patient remains too agitated at this time.

## 2019-05-04 ENCOUNTER — Inpatient Hospital Stay (HOSPITAL_COMMUNITY): Payer: Medicare Other

## 2019-05-04 DIAGNOSIS — G9341 Metabolic encephalopathy: Secondary | ICD-10-CM

## 2019-05-04 LAB — HSV DNA BY PCR (REFERENCE LAB)
HSV 1 DNA: NEGATIVE
HSV 2 DNA: NEGATIVE

## 2019-05-04 LAB — BASIC METABOLIC PANEL
Anion gap: 10 (ref 5–15)
BUN: <5 mg/dL — ABNORMAL LOW (ref 8–23)
CO2: 23 mmol/L (ref 22–32)
Calcium: 8.9 mg/dL (ref 8.9–10.3)
Chloride: 111 mmol/L (ref 98–111)
Creatinine, Ser: 0.72 mg/dL (ref 0.61–1.24)
GFR calc Af Amer: >60 mL/min (ref >60)
GFR calc non Af Amer: >60 mL/min (ref >60)
Glucose, Bld: 98 mg/dL (ref 70–99)
Potassium: 4 mmol/L (ref 3.5–5.1)
Sodium: 144 mmol/L (ref 135–145)

## 2019-05-04 LAB — CBC
HCT: 43.7 % (ref 39.0–52.0)
Hemoglobin: 14.5 g/dL (ref 13.0–17.0)
MCH: 31.3 pg (ref 26.0–34.0)
MCHC: 33.2 g/dL (ref 30.0–36.0)
MCV: 94.4 fL (ref 80.0–100.0)
Platelets: 136 10*3/uL — ABNORMAL LOW (ref 150–400)
RBC: 4.63 MIL/uL (ref 4.22–5.81)
RDW: 12.9 % (ref 11.5–15.5)
WBC: 10.6 10*3/uL — ABNORMAL HIGH (ref 4.0–10.5)
nRBC: 0 % (ref 0.0–0.2)

## 2019-05-04 LAB — GLUCOSE, CAPILLARY
Glucose-Capillary: 76 mg/dL (ref 70–99)
Glucose-Capillary: 84 mg/dL (ref 70–99)
Glucose-Capillary: 85 mg/dL (ref 70–99)
Glucose-Capillary: 91 mg/dL (ref 70–99)

## 2019-05-04 LAB — URINE CULTURE: Culture: NO GROWTH

## 2019-05-04 LAB — PHOSPHORUS: Phosphorus: 1.5 mg/dL — ABNORMAL LOW (ref 2.5–4.6)

## 2019-05-04 LAB — MAGNESIUM: Magnesium: 1.8 mg/dL (ref 1.7–2.4)

## 2019-05-04 MED ORDER — DEXMEDETOMIDINE HCL IN NACL 400 MCG/100ML IV SOLN
0.4000 ug/kg/h | INTRAVENOUS | Status: DC
  Administered 2019-05-04: 1.2 ug/kg/h via INTRAVENOUS
  Administered 2019-05-04: 0.4 ug/kg/h via INTRAVENOUS
  Administered 2019-05-04: 0.6 ug/kg/h via INTRAVENOUS
  Filled 2019-05-04 (×2): qty 100

## 2019-05-04 MED ORDER — HALOPERIDOL LACTATE 5 MG/ML IJ SOLN
4.0000 mg | Freq: Once | INTRAMUSCULAR | Status: AC
  Administered 2019-05-04: 4 mg via INTRAMUSCULAR
  Filled 2019-05-04: qty 1

## 2019-05-04 MED ORDER — SODIUM PHOSPHATES 45 MMOLE/15ML IV SOLN
10.0000 mmol | Freq: Once | INTRAVENOUS | Status: AC
  Administered 2019-05-04: 10 mmol via INTRAVENOUS
  Filled 2019-05-04: qty 3.33

## 2019-05-04 NOTE — Progress Notes (Addendum)
Subjective: Interval History: Patient remains in the ICU for agitation, on precedex gtt. No szs on EEG. Spinal tap unremarkable for infection, mild elevation of glucose and protein (pt is hyperglycemic). He is somnolent but oriented x4, able to follow commands, plegic on LUE. MRI pending. HSV PCR pending.  Objective: Vital signs in last 24 hours: Temp:  [97.6 F (36.4 C)-99.2 F (37.3 C)] 99.2 F (37.3 C) (10/10 0800) Pulse Rate:  [67-114] 102 (10/10 0700) Resp:  [13-32] 29 (10/10 0700) BP: (91-155)/(57-112) 128/67 (10/10 0700) SpO2:  [94 %-99 %] 96 % (10/10 0700) Weight:  [68 kg-71 kg] 71 kg (10/10 0500)  Exam: GEN: NAD, pleasant, cooperative CVS: RRR, no carotid bruit CHEST: No signs of resp distress, on room air ABD: Soft, NTTP  NEURO:  Mental status: Remains drowsy but opens eyes to voice, is oriented to time place and person. Attention concentration is mildly reduced Speech and language:Speech slightly dysarthric, patient says his mouth is dry.  No evidence of aphasia Cranial nerves: Pupils equal and reactive, EOMI, no gaze preference or deviation  Mild L nasolabial flattening Motor exam: Antigravity in RUE and RLE, LUE plegic, LLE AG Sensation intact to LT Unable to reliably assess coordination  Lab Results: Recent Labs    05/03/19 0707 05/03/19 0721 05/04/19 0245  WBC 10.6*  --  10.6*  HGB 15.1 15.6 14.5  HCT 45.9 46.0 43.7  PLT 191  --  136*  NA 140 141 144  K 3.9 3.8 4.0  CL 103 107 111  CO2 16*  --  23  GLUCOSE 161* 155* 98  BUN 6* 7* <5*  CREATININE 0.92 0.70 0.72  CALCIUM 9.1  --  8.9   Studies/Results: Ct Head Wo Contrast  Result Date: 05/03/2019 CLINICAL DATA:  Altered mental status.  Seizure-like activity. EXAM: CT HEAD WITHOUT CONTRAST TECHNIQUE: Contiguous axial images were obtained from the base of the skull through the vertex without intravenous contrast. COMPARISON:  January 08 2016 FINDINGS: Brain: No evidence of acute infarction, hemorrhage,  hydrocephalus, extra-axial collection or mass lesion/mass effect. There is mild chronic diffuse atrophy. Vascular: No hyperdense vessel is noted. Skull: Normal. Negative for fracture or focal lesion. Sinuses/Orbits: Small right maxillary sinus retention cyst is identified. The other visualized sinuses are clear. The orbits are normal. Other: None. IMPRESSION: No focal acute intracranial abnormality identified. Chronic diffuse atrophy. Electronically Signed   By: Sherian Rein M.D.   On: 05/03/2019 08:02   Dg Chest Port 1 View  Result Date: 05/03/2019 CLINICAL DATA:  Altered mental status, seizure EXAM: PORTABLE CHEST 1 VIEW COMPARISON:  01/09/2016 FINDINGS: Cardiomegaly. Both lungs are clear. The visualized skeletal structures are unremarkable. IMPRESSION: Cardiomegaly without acute abnormality of the lungs in AP portable projection. Electronically Signed   By: Lauralyn Primes M.D.   On: 05/03/2019 09:47   EEG:  This study is suggestive of severe diffuse encephalopathy, likely secondary to the ativan use. No seizures or definite epileptiform discharges were seen throughout the recording. The excessive beta activity seen in the background is most likely due to the effect of benzodiazepine and is a benign EEG pattern.  LP: CSF unremarkable for infection.  Glucose mildly elevated but has hyperglycemia on blood tests.  Protein barely out of normal range at 47.  Medications:  Scheduled: . Chlorhexidine Gluconate Cloth  6 each Topical Daily  . heparin  5,000 Units Subcutaneous Q8H  . pantoprazole (PROTONIX) IV  40 mg Intravenous QHS   Continuous: . sodium chloride Stopped (05/04/19 0708)  .  dexmedetomidine (PRECEDEX) IV infusion 1.2 mcg/kg/hr (05/04/19 0851)  . sodium phosphate  Dextrose 5% IVPB      Assessment/Plan:  New onset seizure/Status epilepticus Toxic metabolic encephalopathy- because under investigation  - UDS neg, etoh lvl undetectable - LP unremarkable for seizures or infectious  pathology, HSV still pending, LP results do not warrant starting acyclovir at this time - EEG/CTH unremarkable - UA few bacteria present, urine cx pending - CXR unremarkable  Recommendations: - F/u MRI brain and HSV PCR (low suspicion for HSV encephalitis given CSF picture) - consider klonopin +/- seroquel for agitation/precedex wean -c/w Keppra - D/W Dr. Halford Chessman on the Unit   LOS: 1 day   Posey Pronto   Attending Neurohospitalist Addendum Patient seen and examined with APP/Resident. Agree with the history and physical as documented above. Agree with the plan as documented, which I helped formulate. I have independently reviewed the chart, obtained history, review of systems and examined the patient.I have personally reviewed pertinent head/neck/spine imaging (CT/MRI). Please feel free to call with any questions. --- Amie Portland, MD [facility] Pager: [phone]  If 7pm to 7am, please call on call as listed on AMION.   CRITICAL CARE ATTESTATION Performed by: Amie Portland, MD Total critical care time:30 minutes Critical care time was exclusive of separately billable procedures and treating other patients and/or supervising APPs/Residents/Students Critical care was necessary to treat or prevent imminent or life-threatening deterioration due to seizures, status epilepticus, toxic metabolic encephalopathy This patient is critically ill and at significant risk for neurological worsening and/or death and care requires constant monitoring. Critical care was time spent personally by me on the following activities: development of treatment plan with patient and/or surrogate as well as nursing, discussions with consultants, evaluation of patient's response to treatment, examination of patient, obtaining history from patient or surrogate, ordering and performing treatments and interventions, ordering and review of laboratory studies, ordering and review of radiographic  studies, pulse oximetry, re-evaluation of patient's condition, participation in multidisciplinary rounds and medical decision making of high complexity in the care of this patient.

## 2019-05-04 NOTE — Progress Notes (Signed)
/  NAME:  Jon [name], MRN:  [id number], DOB:  1956-02-21, LOS: 1 ADMISSION DATE:  05/03/2019, CONSULTATION DATE:  05/03/2019 REFERRING MD:  Dr. Lorelle Formosa, CHIEF COMPLAINT:  Seizure    Brief History   63 yo male former smoker with seizure and altered mental status.  Past Medical History  Basal Ganglia hemorrhage in 2017, HTN, ETOH (quit 2017)  Significant Hospital Events   10/9 Admitted   Consults:  Neurology   Procedures:    Significant Diagnostic Tests:  Head CT 10/9 >> negative LP 10/9 >> glucose 71, RBC 205, EBC 1, Protein 47  Micro Data:  COVID 10/9 >> negative CSF 10/9 >>  CSF HSV 10/9 >>   Antimicrobials:    Interim history/subjective:  Agitated overnight and pulled out IVs.  Received IM haldol.  Had IV's replaced.  Objective   Blood pressure (!) 145/75, pulse (!) 113, temperature 97.6 F (36.4 C), temperature source Oral, resp. rate 17, weight 71 kg, SpO2 96 %.        Intake/Output Summary (Last 24 hours) at 05/04/2019 0752 Last data filed at 05/04/2019 0600 Gross per 24 hour  Intake 1614.83 ml  Output 1950 ml  Net -335.17 ml   Filed Weights   05/03/19 1200 05/04/19 0500  Weight: 68 kg 71 kg    Examination:  General - somnolent, wakes up easily Eyes - pupils reactive ENT - no sinus tenderness, no stridor Cardiac - regular rate/rhythm, no murmur Chest - equal breath sounds b/l, no wheezing or rales Abdomen - soft, non tender, + bowel sounds Extremities - no cyanosis, clubbing, or edema Skin - no rashes Neuro - follows commands, weak in Lt arm   Resolved Hospital Problem list     Assessment & Plan:   New onset seizures. Acute metabolic encephalopathy. Hx of BG hemorrhagic stroke, depression. - add klonopin 0.5 mg bid, and seroquel 12.5 mg bid once he is able to swallow pills - resume zoloft when able to swallow pills - continue precedex with RASS goal 0 to -1 - neurology following - hold outpt baclofen  Hx of HTN, HLD. - resume ASA,  lopressor, pravachol once able to swallow pills  Dysphagia. - will ask speech to assess  Hypophosphatemia. - replace as needed  Mild thrombocytopenia. - f/u CBC  Best practice:  Diet: NPO DVT prophylaxis: Subq heparin  GI prophylaxis: protonix Mobility: OOB to chair Code Status: Full Disposition: ICU  Labs    CMP Latest Ref Rng & Units 05/04/2019 05/03/2019 05/03/2019  Glucose 70 - 99 mg/dL 98 301(S) 010(X)  BUN 8 - 23 mg/dL <3(A) 7(L) 6(L)  Creatinine 0.61 - 1.24 mg/dL 3.55 7.32 2.02  Sodium 135 - 145 mmol/L 144 141 140  Potassium 3.5 - 5.1 mmol/L 4.0 3.8 3.9  Chloride 98 - 111 mmol/L 111 107 103  CO2 22 - 32 mmol/L 23 - 16(L)  Calcium 8.9 - 10.3 mg/dL 8.9 - 9.1  Total Protein 6.5 - 8.1 g/dL - - 7.3  Total Bilirubin 0.3 - 1.2 mg/dL - - 1.1  Alkaline Phos 38 - 126 U/L - - 53  AST 15 - 41 U/L - - 22  ALT 0 - 44 U/L - - 14    CBC Latest Ref Rng & Units 05/04/2019 05/03/2019 05/03/2019  WBC 4.0 - 10.5 K/uL 10.6(H) - 10.6(H)  Hemoglobin 13.0 - 17.0 g/dL 54.2 70.6 23.7  Hematocrit 39.0 - 52.0 % 43.7 46.0 45.9  Platelets 150 - 400 K/uL 136(L) - 191  CBG (last 3)  Recent Labs    05/03/19 1920 05/03/19 2334 05/04/19 0359  GLUCAP 81 101* 76   D/w Dr. Melton Krebs, MD [facility] Pulmonary/Critical Care 05/04/2019, 8:04 AM

## 2019-05-05 LAB — PHOSPHORUS: Phosphorus: 1.8 mg/dL — ABNORMAL LOW (ref 2.5–4.6)

## 2019-05-05 LAB — MAGNESIUM: Magnesium: 1.2 mg/dL — ABNORMAL LOW (ref 1.7–2.4)

## 2019-05-05 LAB — GLUCOSE, CAPILLARY
Glucose-Capillary: 75 mg/dL (ref 70–99)
Glucose-Capillary: 65 mg/dL — ABNORMAL LOW (ref 70–99)
Glucose-Capillary: 84 mg/dL (ref 70–99)
Glucose-Capillary: 132 mg/dL — ABNORMAL HIGH (ref 70–99)

## 2019-05-05 MED ORDER — FOLIC ACID 1 MG PO TABS
1.0000 mg | ORAL_TABLET | Freq: Every day | ORAL | Status: DC
  Administered 2019-05-05 – 2019-05-07 (×3): 1 mg via ORAL
  Filled 2019-05-05 (×3): qty 1

## 2019-05-05 MED ORDER — QUETIAPINE FUMARATE 25 MG PO TABS
12.5000 mg | ORAL_TABLET | Freq: Every day | ORAL | Status: DC
  Administered 2019-05-05 – 2019-05-06 (×2): 12.5 mg via ORAL
  Filled 2019-05-05 (×3): qty 1

## 2019-05-05 MED ORDER — SODIUM CHLORIDE 0.9 % IV SOLN: INTRAVENOUS | Status: DC | PRN

## 2019-05-05 MED ORDER — CLONAZEPAM 0.5 MG PO TABS
0.5000 mg | ORAL_TABLET | Freq: Two times a day (BID) | ORAL | Status: DC
  Administered 2019-05-05: 0.5 mg via ORAL
  Filled 2019-05-05: qty 1

## 2019-05-05 MED ORDER — PRAVASTATIN SODIUM 40 MG PO TABS
40.0000 mg | ORAL_TABLET | Freq: Every day | ORAL | Status: DC
  Administered 2019-05-05 – 2019-05-06 (×2): 40 mg via ORAL
  Filled 2019-05-05 (×2): qty 1

## 2019-05-05 MED ORDER — METOPROLOL TARTRATE 25 MG PO TABS
25.0000 mg | ORAL_TABLET | Freq: Two times a day (BID) | ORAL | Status: DC
  Administered 2019-05-05 – 2019-05-07 (×5): 25 mg via ORAL
  Filled 2019-05-05 (×5): qty 1

## 2019-05-05 MED ORDER — SERTRALINE HCL 50 MG PO TABS
50.0000 mg | ORAL_TABLET | Freq: Every day | ORAL | Status: DC
  Administered 2019-05-05 – 2019-05-06 (×2): 50 mg via ORAL
  Filled 2019-05-05 (×2): qty 1

## 2019-05-05 MED ORDER — DEXTROSE 50 % IV SOLN
INTRAVENOUS | Status: AC
  Administered 2019-05-05: 08:00:00
  Filled 2019-05-05: qty 50

## 2019-05-05 MED ORDER — LEVETIRACETAM IN NACL 500 MG/100ML IV SOLN
500.0000 mg | Freq: Two times a day (BID) | INTRAVENOUS | Status: DC
  Administered 2019-05-05 – 2019-05-06 (×3): 500 mg via INTRAVENOUS
  Filled 2019-05-05 (×4): qty 100

## 2019-05-05 MED ORDER — DIAZEPAM 2 MG PO TABS
2.0000 mg | ORAL_TABLET | Freq: Two times a day (BID) | ORAL | Status: DC
  Administered 2019-05-05 – 2019-05-07 (×5): 2 mg via ORAL
  Filled 2019-05-05 (×5): qty 1

## 2019-05-05 MED ORDER — ASPIRIN EC 81 MG PO TBEC
81.0000 mg | DELAYED_RELEASE_TABLET | Freq: Every day | ORAL | Status: DC
  Administered 2019-05-05 – 2019-05-07 (×3): 81 mg via ORAL
  Filled 2019-05-05 (×3): qty 1

## 2019-05-05 NOTE — Progress Notes (Addendum)
Neurology Progress Note   S:// Seen and examined.  No agitation overnight.  Not on Precedex.  On p.o. sedation   O:// Current vital signs: BP 110/78   Pulse 99   Temp 98.6 F (37 C) (Oral)   Resp (!) 34   Wt 69.6 kg   SpO2 94%   BMI 22.02 kg/m  Vital signs in last 24 hours: Temp:  [97.8 F (36.6 C)-98.8 F (37.1 C)] 98.6 F (37 C) (10/11 0900) Pulse Rate:  [49-99] 99 (10/11 1000) Resp:  [13-34] 34 (10/11 1000) BP: (110-138)/(62-83) 110/78 (10/11 1000) SpO2:  [92 %-98 %] 94 % (10/11 1000) Weight:  [69.6 kg] 69.6 kg (10/11 0456) General: Awake alert after waking up from sleep.  Comfortable in bed HEENT: Cephalic atraumatic CVS: Regular rate rhythm Respiratory: Breathing well saturating normally on room air Extremities: Warm well perfused with increased tone in the left upper and lower extremity with left upper extremity plegic. Neurological exam Was sleeping in bed, awakes to voice and then is awake alert.  Oriented x2-3. Speech is mildly dysarthric but says that this is baseline. Naming comprehension repetition is intact but attention concentration is poor. Cranial nerves: Pupils are equal round react light, extraocular movements intact, slight left nasolabial fold flattening, auditory acuity intact, tongue and palate midline. Motor exam: Plegic left upper extremity, 4/5 left lower extremity.  Right 5/5 both upper and lower extremities. Sensory exam: Decreased sensation on the left compared to the right. Coordination: No dysmetria on the right side.  Unable to perform finger-nose-finger on the left. Gait testing deferred at this point Medications  Current Facility-Administered Medications:  .  0.9 %  sodium chloride infusion, , Intravenous, PRN, Coralyn Helling, MD .  acetaminophen (TYLENOL) tablet 650 mg, 650 mg, Oral, Q4H PRN, Raymon Mutton F, NP .  aspirin EC tablet 81 mg, 81 mg, Oral, Daily, Sood, Vineet, MD .  Chlorhexidine Gluconate Cloth 2 % PADS 6 each, 6 each,  Topical, Daily, Oretha Milch, MD, 6 each at 05/04/19 [id number] .  clonazePAM (KLONOPIN) tablet 0.5 mg, 0.5 mg, Oral, BID, Sood, Vineet, MD .  folic acid (FOLVITE) tablet 1 mg, 1 mg, Oral, Daily, Sood, Vineet, MD .  heparin injection 5,000 Units, 5,000 Units, Subcutaneous, Q8H, Raymon Mutton F, NP, 5,000 Units at 05/05/19 0558 .  metoprolol tartrate (LOPRESSOR) tablet 25 mg, 25 mg, Oral, BID, Craige Cotta, Vineet, MD .  ondansetron (ZOFRAN) injection 4 mg, 4 mg, Intravenous, Q6H PRN, Raymon Mutton F, NP .  pravastatin (PRAVACHOL) tablet 40 mg, 40 mg, Oral, q1800, Coralyn Helling, MD .  QUEtiapine (SEROQUEL) tablet 12.5 mg, 12.5 mg, Oral, QHS, Sood, Vineet, MD .  sertraline (ZOLOFT) tablet 50 mg, 50 mg, Oral, QHS, Sood, Vineet, MD  Facility-Administered Medications Ordered in Other Encounters:  .  gadopentetate dimeglumine (MAGNEVIST) injection 15 mL, 15 mL, Intravenous, Once PRN, Marvel Plan, MD Labs CBC    Component Value Date/Time   WBC 10.6 (H) 05/04/2019 0245   RBC 4.63 05/04/2019 0245   HGB 14.5 05/04/2019 0245   HCT 43.7 05/04/2019 0245   PLT 136 (L) 05/04/2019 0245   MCV 94.4 05/04/2019 0245   MCH 31.3 05/04/2019 0245   MCHC 33.2 05/04/2019 0245   RDW 12.9 05/04/2019 0245   LYMPHSABS 3.5 05/03/2019 0707   MONOABS 1.1 (H) 05/03/2019 0707   EOSABS 0.3 05/03/2019 0707   BASOSABS 0.1 05/03/2019 0707    CMP     Component Value Date/Time   NA 144 05/04/2019 0245  K 4.0 05/04/2019 0245   CL 111 05/04/2019 0245   CO2 23 05/04/2019 0245   GLUCOSE 98 05/04/2019 0245   BUN <5 (L) 05/04/2019 0245   CREATININE 0.72 05/04/2019 0245   CALCIUM 8.9 05/04/2019 0245   PROT 7.3 05/03/2019 0707   ALBUMIN 4.2 05/03/2019 0707   AST 22 05/03/2019 0707   ALT 14 05/03/2019 0707   ALKPHOS 53 05/03/2019 0707   BILITOT 1.1 05/03/2019 0707   GFRNONAA >60 05/04/2019 0245   GFRAA >60 05/04/2019 0245     Imaging I have reviewed images in epic and the results pertinent to this consultation : CT of  the head with no acute changes MRI of the brain with no acute abnormalities, encephalomalacia related to the remote right basal ganglia hemorrhage visualized.  Assessment: 63 year old man with a past medical history of a right basal ganglia hemorrhage in 2017, presented with a first-time seizure in the ER.  Remained agitated and aggressive after that requiring Precedex and ICU admission. Precedex has been off since yesterday and he appears to be much more calm although he is very sleepy, wakes up to voice but then falls back asleep again. I suspect the prolonged postictal phase in the setting of existing encephalomalacia as the presentation. The fact that he was on baclofen might have lowered seizure threshold.   Impression: New onset seizure, likely secondary to old stroke  Recommendations: Continue Keppra 500 twice daily Klonopin 0.5 twice daily Seroquel 12 point 5 at night Avoid baclofen. If he is stable, can be transferred to floor. Maintain seizure precautions Detailed seizure precautions below including no driving for until 6 months seizure-free. Spoke with his wife in detail yesterday. We will attempt to reach the wife today.  Neurology will be available as needed. Please call with questions  -- Amie Portland, MD [facility] Pager: [phone] If 7pm to 7am, please call on call as listed on AMION.  Addendum Spoke with patient's wife Concerned about stopping the baclofen. I recommended that we do the following: -Continue Keppra -Instead of Klonopin and tizanidine or something else in place of baclofen-use Valium which can help for both agitation as well as stiffness. - Outpatient neurology follow-up-orders placed in the chart. Rest of the details above Patient is doing much better and is oriented x3 with residual left-sided deficits and no other abnormalities at this time. Neurology will sign off at this time and will be available as needed.  Please call with  questions Plan was discussed in detail with Dr. Halford Chessman on the unit  -- Amie Portland, MD [facility] Pager: [phone] If 7pm to 7am, please call on call as listed on AMION.

## 2019-05-05 NOTE — Evaluation (Signed)
Clinical/Bedside Swallow Evaluation Patient Details  Name: ALANZO LAMB MRN: [id number] Date of Birth: [birth year]  Today's Date: 05/05/2019 Time: SLP Start Time (ACUTE ONLY): 0755 SLP Stop Time (ACUTE ONLY): 0815 SLP Time Calculation (min) (ACUTE ONLY): 20 min  Past Medical History:  Past Medical History:  Diagnosis Date  . Hypertension   . Liver damage   . Stroke [facility])    Past Surgical History:  Past Surgical History:  Procedure Laterality Date  . TONSILLECTOMY AND ADENOIDECTOMY Bilateral    Age 63   HPI:  Presented with tonic clonic seizure like activity per wife that began at approximately 6am. Wife states she was awoken due to shaking in the bed after which she found patient having what appeared to be a seizure. Patient was able to respond to wife during seizure activity, when asked if she should call 911 the patient responded yes.  No prior history of seizures per wife. Patient has remote history of chronic alcohol and tobacco abuse but wife states both were stopped 3 years ago after basal ganglia hemorrhage in 2017.   Pt was seen by Speech Therapy in 2017 in CIR and Outpatient targeting cognitive-linguistic deficits.  Most recent CXR was unremarkable, and brain MRI/CT were negative for acute findings.    Assessment / Plan / Recommendation Clinical Impression  Pt presents with oral dysphagia and suspected functional pharyngeal phase of the swallow.  Pt was lethargic, but cooperative throughout this tx session.  Oral mechanism exam was remarkable for abnormal palate that appeared to be black in color.  Additionally observed what appeared to be dried blood on the pt's R lingual surface.  Pt also reported mild palatal pain.  RN was made aware of findings and she completed her own evaluation with plans to alert the MD for further assessment.  Pt exhibited decreased facial sensation and lingual/labial ROM on his left; however, suspect that these are residual deficits from CVA in 2017.  Pt  consumed trials of ice chips, thin liquid, and puree.  Pt exhibited prolonged mastication of ice chips and prolonged AP transport of all consistencies; however, no clinical s/sx of aspiration were observed.  Pt exhibited oral holding of thin liquid x1 and required a verbal cue to initiate a swallow.   Recommend initiation of Dysphagia 1 (puree) solids and thin liquid diet with the following precautions: 1) Small bites/sips 2) Slow rate of intake 3) Sit upright 90 degrees.  Additionally recommend that medications be administered crushed in puree. Pt would benefit from full supervision and assistance with meals to encourage use of compensatory strategies.  Suspect that pt will be able to tolerate more complex solids in the near future.    SLP Visit Diagnosis: Dysphagia, oral phase (R13.11)    Aspiration Risk  Mild aspiration risk    Diet Recommendation Dysphagia 1 (Puree);Thin liquid   Liquid Administration via: Cup;Straw Medication Administration: Crushed with puree Supervision: Staff to assist with self feeding;Full supervision/cueing for compensatory strategies Compensations: Minimize environmental distractions;Slow rate;Small sips/bites Postural Changes: Seated upright at 90 degrees    Other  Recommendations Oral Care Recommendations: Oral care BID   Follow up Recommendations 24 hour supervision/assistance      Frequency and Duration min 2x/week  2 weeks       Prognosis Prognosis for Safe Diet Advancement: Good Barriers to Reach Goals: Cognitive deficits      Swallow Study   General HPI: Presented with tonic clonic seizure like activity per wife that began at approximately 6am. Wife states  she was awoken due to shaking in the bed after which she found patient having what appeared to be a seizure. Patient was able to respond to wife during seizure activity, when asked if she should call 911 the patient responded yes.  No prior history of seizures per wife. Patient has remote history  of chronic alcohol and tobacco abuse but wife states both were stopped 3 years ago after basal ganglia hemorrhage in 2017.   Pt was seen by Speech Therapy in 2017 in CIR and Outpatient targeting cognitive-linguistic deficits.  Most recent CXR was unremarkable, and brain MRI/CT were clear for acute change.  Type of Study: Bedside Swallow Evaluation Diet Prior to this Study: NPO Temperature Spikes Noted: Yes Respiratory Status: Room air History of Recent Intubation: No Behavior/Cognition: Lethargic/Drowsy;Requires cueing;Cooperative Oral Cavity Assessment: Dried secretions(Abnormal palate that appeared to be blackened. ) Oral Care Completed by SLP: No Oral Cavity - Dentition: Adequate natural dentition Vision: Functional for self-feeding Self-Feeding Abilities: Needs set up Patient Positioning: Upright in bed Baseline Vocal Quality: Normal Volitional Swallow: Able to elicit    Oral/Motor/Sensory Function Overall Oral Motor/Sensory Function: Mild impairment Facial ROM: Reduced left Facial Symmetry: Abnormal symmetry left Facial Sensation: Reduced left Lingual ROM: Within Functional Limits Lingual Symmetry: Within Functional Limits   Ice Chips Ice chips: Impaired Presentation: Spoon Oral Phase Impairments: Impaired mastication   Thin Liquid Thin Liquid: Within functional limits Presentation: Cup;Straw    Nectar Thick Nectar Thick Liquid: Not tested   Honey Thick Honey Thick Liquid: Not tested   Puree Puree: Impaired Presentation: Spoon Oral Phase Functional Implications: Prolonged oral transit   Solid     Solid: Not tested     Bretta Bang, M.S., [place] [facility] Office: [phone]  Elvia Collum Jordyn Doane 05/05/2019,8:52 AM

## 2019-05-05 NOTE — Progress Notes (Signed)
/  NAME:  Jon [name], MRN:  [id number], DOB:  1955-09-28, LOS: 2 ADMISSION DATE:  05/03/2019, CONSULTATION DATE:  05/03/2019 REFERRING MD:  Dr. Lorelle Formosa, CHIEF COMPLAINT:  Seizure    Brief History   63 yo male former smoker with seizure and altered mental status.  Past Medical History  Basal Ganglia hemorrhage in 2017, HTN, ETOH (quit 2017)  Significant Hospital Events   10/9 Admitted   Consults:  Neurology   Procedures:    Significant Diagnostic Tests:  Head CT 10/9 >> negative LP 10/9 >> glucose 71, RBC 205, EBC 1, Protein 47  Micro Data:  COVID 10/9 >> negative CSF 10/9 >>  CSF HSV 10/9 >> negative  Antimicrobials:    Interim history/subjective:  Denies headache.  Objective   Blood pressure 118/83, pulse 88, temperature 98.3 F (36.8 C), temperature source Oral, resp. rate 19, weight 69.6 kg, SpO2 93 %.        Intake/Output Summary (Last 24 hours) at 05/05/2019 0914 Last data filed at 05/05/2019 0600 Gross per 24 hour  Intake 1357.22 ml  Output 925 ml  Net 432.22 ml   Filed Weights   05/03/19 1200 05/04/19 0500 05/05/19 0456  Weight: 68 kg 71 kg 69.6 kg    Examination:  General - more alert Eyes - pupils reactive ENT - no sinus tenderness, no stridor Cardiac - regular rate/rhythm, no murmur Chest - equal breath sounds b/l, no wheezing or rales Abdomen - soft, non tender, + bowel sounds Extremities - no cyanosis, clubbing, or edema Skin - no rashes Neuro - Lt arm weak   Resolved Hospital Problem list     Assessment & Plan:   New onset seizures. Acute metabolic encephalopathy. Hx of BG hemorrhagic stroke, depression. - add klonopin 0.5 mg bid - seroquel 12.5 mg qhs - resume zoloft - d/c precedex - neurology following - hold outpt baclofen  Hx of HTN, HLD. - resume ASA, lopressor, pravachol  Dysphagia. - D1 diet - f/u with speech  Hypophosphatemia. Hypomagnesemia. - replace as needed  Mild thrombocytopenia. - f/u CBC  Best  practice:  Diet: D1 diet DVT prophylaxis: Subq heparin  GI prophylaxis: not indicated Mobility: OOB to chair Code Status: Full Disposition: med surg  To [facility] 10/12 and PCCM off.  Labs    CMP Latest Ref Rng & Units 05/04/2019 05/03/2019 05/03/2019  Glucose 70 - 99 mg/dL 98 878(M) 767(M)  BUN 8 - 23 mg/dL <0(N) 7(L) 6(L)  Creatinine 0.61 - 1.24 mg/dL 4.70 9.62 8.36  Sodium 135 - 145 mmol/L 144 141 140  Potassium 3.5 - 5.1 mmol/L 4.0 3.8 3.9  Chloride 98 - 111 mmol/L 111 107 103  CO2 22 - 32 mmol/L 23 - 16(L)  Calcium 8.9 - 10.3 mg/dL 8.9 - 9.1  Total Protein 6.5 - 8.1 g/dL - - 7.3  Total Bilirubin 0.3 - 1.2 mg/dL - - 1.1  Alkaline Phos 38 - 126 U/L - - 53  AST 15 - 41 U/L - - 22  ALT 0 - 44 U/L - - 14    CBC Latest Ref Rng & Units 05/04/2019 05/03/2019 05/03/2019  WBC 4.0 - 10.5 K/uL 10.6(H) - 10.6(H)  Hemoglobin 13.0 - 17.0 g/dL 62.9 47.6 54.6  Hematocrit 39.0 - 52.0 % 43.7 46.0 45.9  Platelets 150 - 400 K/uL 136(L) - 191    CBG (last 3)  Recent Labs    05/05/19 0018 05/05/19 0554 05/05/19 0756  GLUCAP 84 75 65*   D/w Dr. Elon Spanner  Chesley Mires, MD [facility] Pulmonary/Critical Care 05/05/2019, 9:14 AM

## 2019-05-06 DIAGNOSIS — I69359 Hemiplegia and hemiparesis following cerebral infarction affecting unspecified side: Secondary | ICD-10-CM

## 2019-05-06 LAB — BASIC METABOLIC PANEL
Anion gap: 11 (ref 5–15)
BUN: 5 mg/dL — ABNORMAL LOW (ref 8–23)
CO2: 21 mmol/L — ABNORMAL LOW (ref 22–32)
Calcium: 8.3 mg/dL — ABNORMAL LOW (ref 8.9–10.3)
Chloride: 109 mmol/L (ref 98–111)
Creatinine, Ser: 0.73 mg/dL (ref 0.61–1.24)
GFR calc Af Amer: >60 mL/min (ref >60)
GFR calc non Af Amer: >60 mL/min (ref >60)
Glucose, Bld: 75 mg/dL (ref 70–99)
Potassium: 3 mmol/L — ABNORMAL LOW (ref 3.5–5.1)
Sodium: 141 mmol/L (ref 135–145)

## 2019-05-06 LAB — MAGNESIUM: Magnesium: 1.7 mg/dL (ref 1.7–2.4)

## 2019-05-06 LAB — CBC
HCT: 38.1 % — ABNORMAL LOW (ref 39.0–52.0)
Hemoglobin: 13.3 g/dL (ref 13.0–17.0)
MCH: 31.5 pg (ref 26.0–34.0)
MCHC: 34.9 g/dL (ref 30.0–36.0)
MCV: 90.3 fL (ref 80.0–100.0)
Platelets: 123 10*3/uL — ABNORMAL LOW (ref 150–400)
RBC: 4.22 MIL/uL (ref 4.22–5.81)
RDW: 12.6 % (ref 11.5–15.5)
WBC: 8.2 10*3/uL (ref 4.0–10.5)
nRBC: 0 % (ref 0.0–0.2)

## 2019-05-06 LAB — CSF CELL COUNT WITH DIFFERENTIAL
RBC Count, CSF: 1 /mm3 — ABNORMAL HIGH
RBC Count, CSF: 205 /mm3 — ABNORMAL HIGH
Tube #: 1
Tube #: 4
WBC, CSF: 1 /mm3 (ref 0–5)
WBC, CSF: 2 /mm3 (ref 0–5)

## 2019-05-06 LAB — CSF CULTURE W GRAM STAIN: Culture: NO GROWTH

## 2019-05-06 LAB — PHOSPHORUS: Phosphorus: 2.5 mg/dL (ref 2.5–4.6)

## 2019-05-06 MED ORDER — POTASSIUM CHLORIDE CRYS ER 20 MEQ PO TBCR
40.0000 meq | EXTENDED_RELEASE_TABLET | Freq: Once | ORAL | Status: AC
  Administered 2019-05-06: 40 meq via ORAL
  Filled 2019-05-06: qty 2

## 2019-05-06 MED ORDER — LEVETIRACETAM 500 MG PO TABS
500.0000 mg | ORAL_TABLET | Freq: Two times a day (BID) | ORAL | Status: DC
  Administered 2019-05-06 – 2019-05-07 (×2): 500 mg via ORAL
  Filled 2019-05-06 (×2): qty 1

## 2019-05-06 MED ORDER — MAGNESIUM SULFATE 4 GM/100ML IV SOLN
4.0000 g | Freq: Once | INTRAVENOUS | Status: AC
  Administered 2019-05-06: 4 g via INTRAVENOUS
  Filled 2019-05-06: qty 100

## 2019-05-06 MED ORDER — FLUTICASONE PROPIONATE 50 MCG/ACT NA SUSP
1.0000 | Freq: Every day | NASAL | Status: DC
  Administered 2019-05-06 – 2019-05-07 (×2): 1 via NASAL
  Filled 2019-05-06: qty 16

## 2019-05-06 NOTE — Evaluation (Signed)
Occupational Therapy Evaluation Patient Details Name: Jon [name] MRN: [id number] DOB: 1956/03/29 Today's Date: 05/06/2019    History of Present Illness 63yo M with history of Basal Ganglia hemorrhage in 2017 presented with new onset seizures and agitation   Clinical Impression   Pt admitted with above diagnoses, presenting with baseline L hemiplegia and now generalized weakness and decreased activity tolerance post seizure. PTA pt was mod I for BADL- wife present for collateral info. At time of eval, he is at overall min A level for BADLs due to conditioning. He was able to don sweat pants and shoes during session with min A and complete toilet t/fs. He appears impulsive, wife states this is close to baseline for pt is very determined to complete tasks ind. Pt fatigued easily, falling asleep by end of session. Given current status, recommend HHOT at d/c for safe progression on BADL prior to d/c. Will continue to follow per POC listed below.    Follow Up Recommendations  Home health OT;Supervision/Assistance - 24 hour    Equipment Recommendations  None recommended by OT    Recommendations for Other Services       Precautions / Restrictions Precautions Precautions: Fall Precaution Comments: hx of L hemiplegia Restrictions Weight Bearing Restrictions: No      Mobility Bed Mobility Overal bed mobility: Needs Assistance Bed Mobility: Supine to Sit     Supine to sit: Min guard     General bed mobility comments: for safety and impulsiveness  Transfers Overall transfer level: Needs assistance Equipment used: Quad cane Transfers: Sit to/from Stand Sit to Stand: Min assist;+2 safety/equipment         General transfer comment: min A for safety and +2 to manage IV from bed <> toilet <> chair    Balance Overall balance assessment: Needs assistance Sitting-balance support: Feet supported Sitting balance-Leahy Scale: Fair     Standing balance support: Single extremity  supported;During functional activity Standing balance-Leahy Scale: Poor Standing balance comment: reliant on external support                           ADL either performed or assessed with clinical judgement   ADL Overall ADL's : Needs assistance/impaired Eating/Feeding: Set up;Sitting   Grooming: Set up;Sitting   Upper Body Bathing: Minimal assistance;Sitting   Lower Body Bathing: Minimal assistance;Sit to/from stand;Sitting/lateral leans   Upper Body Dressing : Minimal assistance;Sitting   Lower Body Dressing: Minimal assistance;Sit to/from stand;Sitting/lateral leans Lower Body Dressing Details (indicate cue type and reason): to don socks and shoes with legs crossed Toilet Transfer: Minimal assistance;+2 for safety/equipment Toilet Transfer Details (indicate cue type and reason): with QC plus 2 for IV management Toileting- Clothing Manipulation and Hygiene: Minimal assistance;Sit to/from stand       Functional mobility during ADLs: Minimal assistance;+2 for safety/equipment(QC) General ADL Comments: pt ltd bt geenralized weakness and decreased activity tolerance     Vision   Vision Assessment?: No apparent visual deficits     Perception     Praxis      Pertinent Vitals/Pain Pain Assessment: No/denies pain Faces Pain Scale: No hurt     Hand Dominance     Extremity/Trunk Assessment Upper Extremity Assessment Upper Extremity Assessment: LUE deficits/detail;Generalized weakness LUE Deficits / Details: hemiplegia at baseline   Lower Extremity Assessment Lower Extremity Assessment: Defer to PT evaluation       Communication Communication Communication: No difficulties   Cognition Arousal/Alertness: Awake/alert Behavior During Therapy: [facility]  for tasks assessed/performed;Impulsive Overall Cognitive Status: History of cognitive impairments - at baseline                                 General Comments: pt at times slow to respoind, but  responded appropriately. At times appeared impulsive, but wife present and states he is very determined and ind to get things done. Suspect he is not far from baseline cognitvely   General Comments       Exercises     Shoulder Instructions      Home Living Family/patient expects to be discharged to:: Private residence Living Arrangements: Spouse/significant other Available Help at Discharge: Family Type of Home: House Home Access: Stairs to enter Secretary/administrator of Steps: 2   Home Layout: One level     Bathroom Shower/Tub: Chief Strategy Officer: Standard     Home Equipment: Cane - quad;Tub bench          Prior Functioning/Environment Level of Independence: Independent with assistive device(s)        Comments: uses QC, does all BADL        OT Problem List: Decreased strength;Decreased activity tolerance;Impaired UE functional use;Impaired balance (sitting and/or standing);Decreased safety awareness      OT Treatment/Interventions: Self-care/ADL training;Therapeutic exercise;Patient/family education;Neuromuscular education;Balance training;Energy conservation;Therapeutic activities;DME and/or AE instruction    OT Goals(Current goals can be found in the care plan section) Acute Rehab OT Goals Patient Stated Goal: to return to ind OT Goal Formulation: With patient Time For Goal Achievement: 05/20/19 Potential to Achieve Goals: Good  OT Frequency: Min 2X/week   Barriers to D/C:            Co-evaluation              AM-PAC OT "6 Clicks" Daily Activity     Outcome Measure Help from another person eating meals?: A Little Help from another person taking care of personal grooming?: A Little Help from another person toileting, which includes using toliet, bedpan, or urinal?: A Little Help from another person bathing (including washing, rinsing, drying)?: A Little Help from another person to put on and taking off regular upper body  clothing?: A Little Help from another person to put on and taking off regular lower body clothing?: A Little 6 Click Score: 18   End of Session Equipment Utilized During Treatment: Gait belt;Other (comment)(QC) Nurse Communication: Mobility status  Activity Tolerance: Patient tolerated treatment well Patient left: in chair;with call bell/phone within reach;with chair alarm set  OT Visit Diagnosis: Other abnormalities of gait and mobility (R26.89);Muscle weakness (generalized) (M62.81);Hemiplegia and hemiparesis Hemiplegia - Right/Left: Left Hemiplegia - dominant/non-dominant: Dominant Hemiplegia - caused by: Nontraumatic intracerebral hemorrhage                Time: 1111-1147 OT Time Calculation (min): 36 min Charges:  OT General Charges $OT Visit: 1 Visit OT Evaluation $OT Eval Moderate Complexity: 1 Mod  Dalphine Handing, MSOT, OTR/L [employer] OT/ Acute Relief OT [facility] Office: [phone]   Dalphine Handing 05/06/2019, 1:44 PM

## 2019-05-06 NOTE — Evaluation (Signed)
Physical Therapy Evaluation Patient Details Name: Jon [name] MRN: [id number] DOB: 03-21-1956 Today's Date: 05/06/2019   History of Present Illness  63yo M with history of Basal Ganglia hemorrhage in 2017 presented with new onset seizures and agitation  Clinical Impression  PTA pt was independent with ambulation with quad cane, and independent in ADLs, pt's wife assists with iADLs. Pt is limited in safe mobility by historic L sided hemiplegia which pt reports has more "tightness" now, as well as decreased strength and endurance. Pt is also very independent and is not fully aware of his safety with his current deficits. Pt currently requires min guard for bed mobility, minAx2 for transfers and minAx2 for ambulation of 16 feet with quad cane. Pt fatigues quickly with ambulation with decreased L foot clearance. Pt adamant about using commode in bathroom, requiring assist to get into small bathroom. Pt able to use grab bars to stand after using commode and ambulate to recliner. Pt obviously tired, yawning and closing his eyes before he is reclined in chair. PT recommends HHPT level rehab to return to PLOF. PT will continue to follow acutely.     Follow Up Recommendations Home health PT;Supervision/Assistance - 24 hour    Equipment Recommendations  None recommended by PT       Precautions / Restrictions Precautions Precautions: Fall Precaution Comments: hx of L hemiplegia Restrictions Weight Bearing Restrictions: No      Mobility  Bed Mobility Overal bed mobility: Needs Assistance Bed Mobility: Supine to Sit     Supine to sit: Min guard     General bed mobility comments: for safety and impulsiveness  Transfers Overall transfer level: Needs assistance Equipment used: Quad cane Transfers: Sit to/from Stand Sit to Stand: Min assist;+2 safety/equipment         General transfer comment: min A for safety and +2 to manage IV from bed <> toilet <>  chair  Ambulation/Gait Ambulation/Gait assistance: Min assist;+2 safety/equipment Gait Distance (Feet): 16 Feet Assistive device: Quad cane Gait Pattern/deviations: Step-through pattern;Decreased step length - left;Decreased stance time - left;Decreased weight shift to left;Shuffle Gait velocity: slowed Gait velocity interpretation: <1.31 ft/sec, indicative of household ambulator General Gait Details: minAx2 for steadying and managment of lines, pt with decreased L foot clearance and progressive fatigue with ambulation, pt request to go into bathroom, refusing to use BSC. After using bathroom pt requires heavy minA for steadying with ambulation to recliner         Balance Overall balance assessment: Needs assistance Sitting-balance support: Feet supported Sitting balance-Leahy Scale: Fair     Standing balance support: Single extremity supported;During functional activity Standing balance-Leahy Scale: Poor Standing balance comment: reliant on external support                             Pertinent Vitals/Pain Pain Assessment: No/denies pain    Home Living Family/patient expects to be discharged to:: Private residence Living Arrangements: Spouse/significant other Available Help at Discharge: Family Type of Home: House Home Access: Stairs to enter   Technical brewer of Steps: 2 Home Layout: One level Home Equipment: Cane - quad;Tub bench      Prior Function Level of Independence: Independent with assistive device(s)         Comments: uses QC, does all BADL        Extremity/Trunk Assessment   Upper Extremity Assessment Upper Extremity Assessment: LUE deficits/detail;Generalized weakness LUE Deficits / Details: hemiplegia at baseline  Lower Extremity Assessment Lower Extremity Assessment: LLE deficits/detail LLE Deficits / Details: hemipelgia at baseline       Communication   Communication: No difficulties  Cognition Arousal/Alertness:  Awake/alert Behavior During Therapy: WFL for tasks assessed/performed;Impulsive Overall Cognitive Status: History of cognitive impairments - at baseline                                 General Comments: pt at times slow to respoind, but responded appropriately. At times appeared impulsive, but wife present and states he is very determined and ind to get things done. Suspect he is not far from baseline cognitvely      General Comments General comments (skin integrity, edema, etc.): Pt wife in room throughout session. Pt with tone like tremors with yawning, resolve after yawn. Pt reports as new        Assessment/Plan    PT Assessment Patient needs continued PT services  PT Problem List Decreased strength;Decreased range of motion;Decreased activity tolerance;Decreased balance;Decreased mobility;Decreased coordination;Decreased safety awareness       PT Treatment Interventions DME instruction;Gait training;Functional mobility training;Therapeutic activities;Therapeutic exercise;Balance training;Cognitive remediation;Patient/family education    PT Goals (Current goals can be found in the Care Plan section)  Acute Rehab PT Goals Patient Stated Goal: to return to ind PT Goal Formulation: With patient/family Time For Goal Achievement: 05/20/19 Potential to Achieve Goals: Good    Frequency Min 3X/week   Barriers to discharge        Co-evaluation PT/OT/SLP Co-Evaluation/Treatment: Yes Reason for Co-Treatment: For patient/therapist safety PT goals addressed during session: Mobility/safety with mobility         AM-PAC PT "6 Clicks" Mobility  Outcome Measure Help needed turning from your back to your side while in a flat bed without using bedrails?: None Help needed moving from lying on your back to sitting on the side of a flat bed without using bedrails?: None Help needed moving to and from a bed to a chair (including a wheelchair)?: A Little Help needed standing  up from a chair using your arms (e.g., wheelchair or bedside chair)?: A Little Help needed to walk in hospital room?: A Little Help needed climbing 3-5 steps with a railing? : A Lot 6 Click Score: 19    End of Session Equipment Utilized During Treatment: Gait belt Activity Tolerance: Patient tolerated treatment well Patient left: in chair;with call bell/phone within reach;with chair alarm set;with family/visitor present Nurse Communication: Mobility status PT Visit Diagnosis: Unsteadiness on feet (R26.81);Other abnormalities of gait and mobility (R26.89);Muscle weakness (generalized) (M62.81);Difficulty in walking, not elsewhere classified (R26.2);Other symptoms and signs involving the nervous system (R29.898);Hemiplegia and hemiparesis Hemiplegia - Right/Left: Left Hemiplegia - dominant/non-dominant: Non-dominant Hemiplegia - caused by: Cerebral infarction    Time: 1110-1145 PT Time Calculation (min) (ACUTE ONLY): 35 min   Charges:   PT Evaluation $PT Eval Moderate Complexity: 1 Mod          Bufford Helms B. Beverely Risen PT, DPT Acute Rehabilitation Services Pager [phone] Office [phone]   Elon Alas Fleet 05/06/2019, 3:29 PM

## 2019-05-06 NOTE — Progress Notes (Signed)
  Speech Language Pathology Treatment: Dysphagia  Patient Details Name: Jon [name] MRN: [id number] DOB: 24-Nov-1955 Today's Date: 05/06/2019 Time: 4098-1191 SLP Time Calculation (min) (ACUTE ONLY): 15 min  Assessment / Plan / Recommendation Clinical Impression  Pt was encountered awake, but lethargic sitting upright in bed with family present at bedside.  RN reported that pt had tolerated his breakfast without difficulty, but that he exhibited some coughing when consuming medications whole in puree.  Pt was observed with trials of thin liquid, puree and regular solids.  Pt exhibited prolonged, but effective mastication of regular solids and no oral residue was observed.  Pt additionally exhibited good bolus acceptance with self feeding given assistance and timely AP transport of pureed solids.  Pt was observed to have bolus holding and swishing of thin liquid trials x2, requiring a verbal cue to initiate a swallow.   Following both of these trials, pt exhibited a delayed throat clear.  No clinical s/sx of aspiration were observed with thin liquid via cup or straw sip when pt had timely swallow initiation.   Pt was educated regarding aspiration precautions including: 1) Small bites/sips 2) Slow rate of intake 3) Sit upright 90 degrees 4) Timely swallow initiation, and he verbalized understanding with teach back; however, he required min-mod verbal and tactile cues throughout the session to utilize strategies.  Recommend diet upgrade to Dysphagia 2 (fine chop) solids and continuation of thin liquid with full supervision to encourage use of compensatory strategies.  Additionally recommend that medications be administered crushed in puree.     HPI HPI: Presented with tonic clonic seizure like activity per wife that began at approximately 6am. Wife states she was awoken due to shaking in the bed after which she found patient having what appeared to be a seizure. Patient was able to respond to wife during  seizure activity, when asked if she should call 911 the patient responded yes.  No prior history of seizures per wife. Patient has remote history of chronic alcohol and tobacco abuse but wife states both were stopped 3 years ago after basal ganglia hemorrhage in 2017.   Pt was seen by Speech Therapy in 2017 in CIR and Outpatient targeting cognitive-linguistic deficits.  Most recent CXR was unremarkable, and brain MRI/CT were clear for acute change.       SLP Plan  Continue with current plan of care       Recommendations  Diet recommendations: Dysphagia 2 (fine chop) Liquids provided via: Cup;Straw Medication Administration: Crushed with puree Supervision: Staff to assist with self feeding;Full supervision/cueing for compensatory strategies Compensations: Minimize environmental distractions;Slow rate;Small sips/bites Postural Changes and/or Swallow Maneuvers: Seated upright 90 degrees                Oral Care Recommendations: Oral care BID Follow up Recommendations: 24 hour supervision/assistance SLP Visit Diagnosis: Dysphagia, unspecified (R13.10) Plan: Continue with current plan of care       Bretta Bang, M.S., [place] Office: [phone]               [place] 05/06/2019, 11:05 AM

## 2019-05-06 NOTE — Progress Notes (Signed)
PROGRESS NOTE    Jon [name]  ZOX:[id number] DOB: 1956-05-18 DOA: 05/03/2019 PCP: Elias Elseeade, Robert, MD    Brief Narrative: 63 year old male with history of hemorrhagic stroke 3 years ago with residual left-sided weakness  admitted with new onset seizure on 05/03/2019 TRH pickup for 05/06/2019   Assessment & Plan:   Active Problems:   Seizure (HCC)  #1 new onset seizures in a patient with history of hemorrhagic stroke-neurology has seen the patient and thinks baclofen Low threshold for seizure.  Currently baclofen on hold and Klonopin has been started.  Patient was started on Keppra 500 mg twice a day with Klonopin 0.5 mg twice a day.  Staff reports no further seizures overnight.  Patient is followed by speech therapy and recommends dysphagia 2 diet.  PT consult pending needs this prior to discharge.  Wife requesting speech therapy to follow at home.  #2 hypokalemia/hypomagnesemia-being repleted   #htn stable on beta blocker  #history of alcohol use -quit 3 years ago   Estimated body mass index is 19.74 kg/m as calculated from the following:   Height as of 04/04/19: 5\' 10"  (1.778 m).   Weight as of this encounter: 62.4 kg.  DVT prophylaxis: heparin Code Status:full Family Communication:dw wife Disposition Plan:pending clinical improvement  Consultants:   PCCM and neurology  Procedures: Head CT lumbar puncture Antimicrobials: None  Subjective: Resting in bed in no acute distress Objective: Vitals:   05/05/19 2120 05/06/19 0500 05/06/19 0829 05/06/19 1200  BP: 129/77  (!) 143/81   Pulse: 85  89   Resp:      Temp:   98.8 F (37.1 C) 98.7 F (37.1 C)  TempSrc:   Oral Oral  SpO2: 95%  95%   Weight:  62.4 kg      Intake/Output Summary (Last 24 hours) at 05/06/2019 1446 Last data filed at 05/06/2019 1200 Gross per 24 hour  Intake 820 ml  Output 300 ml  Net 520 ml   Filed Weights   05/04/19 0500 05/05/19 0456 05/06/19 0500  Weight: 71 kg 69.6 kg 62.4 kg    Examination: Alert answers my questions appropriately follows commands  General exam: Appears calm and comfortable  Respiratory system: Clear to auscultation. Respiratory effort normal. Cardiovascular system: S1 & S2 heard, RRR. No JVD, murmurs, rubs, gallops or clicks. No pedal edema. Gastrointestinal system: Abdomen is nondistended, soft and nontender. No organomegaly or masses felt. Normal bowel sounds heard. Central nervous system: Alert and oriented.  Left upper and lower extremity hemiparesis  extremities: Symmetric 5 x 5 power. Skin: No rashes, lesions or ulcers Psychiatry: Judgement and insight appear normal. Mood & affect appropriate.     Data Reviewed: I have personally reviewed following labs and imaging studies  CBC: Recent Labs  Lab 05/03/19 0707 05/03/19 0721 05/04/19 0245 05/06/19 0404  WBC 10.6*  --  10.6* 8.2  NEUTROABS 5.6  --   --   --   HGB 15.1 15.6 14.5 13.3  HCT 45.9 46.0 43.7 38.1*  MCV 96.2  --  94.4 90.3  PLT 191  --  136* 123*   Basic Metabolic Panel: Recent Labs  Lab 05/03/19 0707 05/03/19 0721 05/04/19 0245 05/05/19 0346 05/06/19 0404  NA 140 141 144  --  141  K 3.9 3.8 4.0  --  3.0*  CL 103 107 111  --  109  CO2 16*  --  23  --  21*  GLUCOSE 161* 155* 98  --  75  BUN 6* 7* <  5*  --  5*  CREATININE 0.92 0.70 0.72  --  0.73  CALCIUM 9.1  --  8.9  --  8.3*  MG 2.0  --  1.8 1.2* 1.7  PHOS 2.9  --  1.5* 1.8* 2.5   GFR: Estimated Creatinine Clearance: 84.5 mL/min (by C-G formula based on SCr of 0.73 mg/dL). Liver Function Tests: Recent Labs  Lab 05/03/19 0707  AST 22  ALT 14  ALKPHOS 53  BILITOT 1.1  PROT 7.3  ALBUMIN 4.2   No results for input(s): LIPASE, AMYLASE in the last 168 hours. Recent Labs  Lab 05/03/19 0807  AMMONIA 27   Coagulation Profile: Recent Labs  Lab 05/03/19 0707  INR 1.0   Cardiac Enzymes: Recent Labs  Lab 05/03/19 0707  CKTOTAL 41*   BNP (last 3 results) No results for input(s): PROBNP in the  last 8760 hours. HbA1C: No results for input(s): HGBA1C in the last 72 hours. CBG: Recent Labs  Lab 05/04/19 2005 05/05/19 0018 05/05/19 0554 05/05/19 0756 05/05/19 0919  GLUCAP 91 84 75 65* 132*   Lipid Profile: No results for input(s): CHOL, HDL, LDLCALC, TRIG, CHOLHDL, LDLDIRECT in the last 72 hours. Thyroid Function Tests: No results for input(s): TSH, T4TOTAL, FREET4, T3FREE, THYROIDAB in the last 72 hours. Anemia Panel: No results for input(s): VITAMINB12, FOLATE, FERRITIN, TIBC, IRON, RETICCTPCT in the last 72 hours. Sepsis Labs: No results for input(s): PROCALCITON, LATICACIDVEN in the last 168 hours.  Recent Results (from the past 240 hour(s))  SARS CORONAVIRUS 2 (TAT 6-24 HRS) Nasopharyngeal Nasopharyngeal Swab     Status: None   Collection Time: 05/03/19  8:07 AM   Specimen: Nasopharyngeal Swab  Result Value Ref Range Status   SARS Coronavirus 2 NEGATIVE NEGATIVE Final    Comment: (NOTE) SARS-CoV-2 target nucleic acids are NOT DETECTED. The SARS-CoV-2 RNA is generally detectable in upper and lower respiratory specimens during the acute phase of infection. Negative results do not preclude SARS-CoV-2 infection, do not rule out co-infections with other pathogens, and should not be used as the sole basis for treatment or other patient management decisions. Negative results must be combined with clinical observations, patient history, and epidemiological information. The expected result is Negative. Fact Sheet for Patients: SugarRoll.be Fact Sheet for Healthcare Providers: https://www.woods-.com/ This test is not yet approved or cleared by the [place] FDA and  has been authorized for detection and/or diagnosis of SARS-CoV-2 by FDA under an Emergency Use Authorization (EUA). This EUA will remain  in effect (meaning this test can be used) for the duration of the COVID-19 declaration under Section 56 4(b)(1) of the  Act, 21 U.S.C. section 360bbb-3(b)(1), unless the authorization is terminated or revoked sooner. Performed at [facility], [place] [address], [place], [place] [address]   Urine culture     Status: None   Collection Time: 05/03/19 12:35 PM   Specimen: Urine, Random  Result Value Ref Range Status   Specimen Description URINE, RANDOM  Final   Special Requests NONE  Final   Culture   Final    NO GROWTH Performed at [facility], [place] [address], [place], [place] [address]    Report Status 05/04/2019 FINAL  Final  CSF culture     Status: None   Collection Time: 05/03/19  1:36 PM   Specimen: CSF; Cerebrospinal Fluid  Result Value Ref Range Status   Specimen Description CSF  Final   Special Requests NONE  Final   Gram Stain  Final    WBC PRESENT, PREDOMINANTLY MONONUCLEAR NO WBC SEEN CYTOSPIN SMEAR    Culture   Final    NO GROWTH Performed at [facility], [address], [place], [place] [address]    Report Status 05/06/2019 FINAL  Final  MRSA PCR Screening     Status: None   Collection Time: 05/03/19  3:53 PM   Specimen: Nasal Mucosa; Nasopharyngeal  Result Value Ref Range Status   MRSA by PCR NEGATIVE NEGATIVE Final    Comment:        The GeneXpert MRSA Assay (FDA approved for NASAL specimens only), is one component of a comprehensive MRSA colonization surveillance program. It is not intended to diagnose MRSA infection nor to guide or monitor treatment for MRSA infections. Performed at [facility], [address], [place], [place] [address]          Radiology Studies: No results found.      Scheduled Meds: . aspirin EC  81 mg Oral Daily  . Chlorhexidine Gluconate Cloth  6 each Topical Daily  . diazepam  2 mg Oral BID  . folic acid  1 mg Oral Daily  . heparin  5,000 Units Subcutaneous Q8H  . levETIRAcetam  500 mg Oral BID  . metoprolol tartrate  25 mg Oral BID  . pravastatin  40 mg Oral q1800  . QUEtiapine  12.5 mg Oral QHS  .  sertraline  50 mg Oral QHS   Continuous Infusions: . sodium chloride       LOS: 3 days     Alwyn Ren, MD [facility]  If 7PM-7AM, please contact night-coverage [email or web address] Password [email or web address] 05/06/2019, 2:46 PM

## 2019-05-06 NOTE — TOC Initial Note (Addendum)
Transition of Care [facility]) - Initial/Assessment Note    Patient Details  Name: SATOSHI KALAS MRN: [id number] Date of Birth: [birth year]  Transition of [facility]) CM/SW Contact:    Bartholomew Crews, RN Phone Number: [phone] 05/06/2019, 4:48 PM  Clinical Narrative:                 Spoke with patient and spouse at the bedside. PTA home with spouse - no HH - DME includes walker, wheelchair, bedside commode. Wife states that she can transport patient home in private vehicle.   Discussed recommendations for [facility] PT and OT. Offered choice of home health agencies. [facility]erral [facility] by [facility]. Anticipate transition home tomorrow with [facility] services initiated within 48 hours of discharge. Patient will need Home Health orders for PT and OT with [facility] prior to discharge.   Following for transition of care needs.   Expected Discharge Plan: [place] Barriers to Discharge: Continued Medical Work up   Patient Goals and CMS Choice Patient states their goals for this hospitalization and ongoing recovery are:: wanting to go home CMS Medicare.gov Compare Post Acute Care list provided to:: Patient Choice offered to / list presented to : Patient, Spouse  Expected Discharge Plan and Services Expected Discharge Plan: [place] In-house Referral: NA Discharge Planning Services: CM Consult Post Acute Care Choice: [place] arrangements for the past 2 months: Single Family Home                 DME Arranged: N/A DME Agency: NA       HH Arranged: PT, OT HH Agency: [facility] Date [place] Agency Contacted: 05/06/19 Time [place]: [place] Representative spoke with at [place]: Glyn Ade  Prior Living Arrangements/Services Living arrangements for the past 2 months: Single Family Home Lives with:: Self, Spouse Patient language and need for interpreter reviewed:: Yes Do you feel safe going back to the place where you live?: Yes       Need for Family Participation in Patient Care: Yes (Comment) Care giver support system in place?: Yes (comment) Current home services: DME Criminal Activity/Legal Involvement Pertinent to Current Situation/Hospitalization: No - Comment as needed  Activities of Daily Living      Permission Sought/Granted Permission sought to share information with : Family Supports    Share Information with NAME: Mckay Tegtmeyer     Permission granted to share info w Relationship: spouse     Emotional Assessment Appearance:: Appears stated age Attitude/Demeanor/Rapport: Engaged Affect (typically observed): Accepting Orientation: : Oriented to Self, Oriented to Place, Oriented to  Time, Oriented to Situation Alcohol / Substance Use: Not Applicable Psych Involvement: No (comment)  Admission diagnosis:  Encephalopathy acute [G93.40] New onset seizure ([place]) [R56.9] History of hemorrhagic stroke with residual hemiplegia ([place]) [I69.359] Patient Active Problem List   Diagnosis Date Noted  . History of hemorrhagic stroke with residual hemiplegia ([place])   . New onset seizure ([place]) 05/03/2019  . Spastic hemiplegia affecting dominant side ([place]) 08/24/2017  . Neuropathic pain of left shoulder 05/19/2016  . Alcohol use disorder, severe, dependence ([place]) 02/15/2016  . Muscle contusion 02/11/2016  . Spastic hemiplegia affecting nondominant side ([place])   . Muscle spasticity   . Poor appetite   . Hyperlipidemia 01/14/2016  . Depression 01/14/2016  . Obesity 01/14/2016  . Hyperglycemia 01/14/2016  . Urinary tract infection, site not specified 01/14/2016  . Basal ganglia hemorrhage ([place]) 01/14/2016  . Hemiparesis affecting  nondominant side as late effect of cerebrovascular accident (HCC)   . Cognitive deficit, post-stroke   . Left hemiparesis (HCC)   . Essential hypertension   . Dysphagia, post-stroke   . Gait disturbance, post-stroke   . Hyponatremia   . Thrombocytopenia (HCC)   . Hemorrhagic stroke  (HCC) R basal ganglia d/t HTN 01/08/2016   PCP:  Elias Else, MD Pharmacy:   [facility] [phone] [place] Otto, [place] - [address] AT [facility] [address] [place] [address] Phone: [phone] Fax: [phone]  [facility] [address] [place] - [address] [place] [address] Phone: [phone] Fax: [phone]     Social Determinants of Health (SDOH) Interventions    Readmission Risk Interventions No flowsheet data found.

## 2019-05-07 LAB — BASIC METABOLIC PANEL
Anion gap: 14 (ref 5–15)
BUN: 5 mg/dL — ABNORMAL LOW (ref 8–23)
CO2: 21 mmol/L — ABNORMAL LOW (ref 22–32)
Calcium: 8.6 mg/dL — ABNORMAL LOW (ref 8.9–10.3)
Chloride: 104 mmol/L (ref 98–111)
Creatinine, Ser: 0.66 mg/dL (ref 0.61–1.24)
GFR calc Af Amer: >60 mL/min (ref >60)
GFR calc non Af Amer: >60 mL/min (ref >60)
Glucose, Bld: 64 mg/dL — ABNORMAL LOW (ref 70–99)
Potassium: 3.9 mmol/L (ref 3.5–5.1)
Sodium: 139 mmol/L (ref 135–145)

## 2019-05-07 LAB — MAGNESIUM: Magnesium: 2.2 mg/dL (ref 1.7–2.4)

## 2019-05-07 MED ORDER — DIAZEPAM 2 MG PO TABS: 2.0000 mg | ORAL_TABLET | Freq: Two times a day (BID) | ORAL | 0 refills | Status: DC

## 2019-05-07 MED ORDER — LEVETIRACETAM 500 MG PO TABS: 500.0000 mg | ORAL_TABLET | Freq: Two times a day (BID) | ORAL | 2 refills | Status: DC

## 2019-05-07 MED ORDER — METOPROLOL TARTRATE 25 MG PO TABS: 25.0000 mg | ORAL_TABLET | Freq: Two times a day (BID) | ORAL | 1 refills | Status: AC

## 2019-05-07 MED ORDER — FLUTICASONE PROPIONATE 50 MCG/ACT NA SUSP: 1.0000 | Freq: Every day | NASAL | 2 refills | Status: DC

## 2019-05-07 MED ORDER — QUETIAPINE FUMARATE 25 MG PO TABS: 12.5000 mg | ORAL_TABLET | Freq: Every day | ORAL | 1 refills | Status: DC

## 2019-05-07 MED ORDER — METOPROLOL TARTRATE 25 MG PO TABS: 25.0000 mg | ORAL_TABLET | Freq: Two times a day (BID) | ORAL | 1 refills | Status: DC

## 2019-05-07 MED ORDER — QUETIAPINE FUMARATE 25 MG PO TABS: 12.5000 mg | ORAL_TABLET | Freq: Every day | ORAL | 1 refills | Status: AC

## 2019-05-07 NOTE — TOC Transition Note (Signed)
Transition of Care [facility]) - CM/SW Discharge Note   Patient Details  Name: Jon [name] MRN: [id number] Date of Birth: [birth year]  [facility]) CM/SW Contact:  Pollie Friar, RN Phone Number: 05/07/2019, 11:06 AM   Clinical Narrative:    Pt discharging home with home health services through [facility] Dorian Pod with [facility] aware of d/c. Pt has all needed DME at home.  Wife to provide supervision at home and transportation to home.   Final next level of care: [place] Barriers to Discharge: No Barriers Identified   Patient Goals and CMS Choice Patient states their goals for this hospitalization and ongoing recovery are:: wanting to go home CMS Medicare.gov Compare Post Acute Care list provided to:: Patient Represenative (must comment) Choice offered to / list presented to : Spouse  Discharge Placement                       Discharge Plan and Services In-house Referral: NA Discharge Planning Services: CM Consult Post Acute Care Choice: Home Health          DME Arranged: N/A DME Agency: NA       HH Arranged: PT, OT [place] [facility]: [facility] [place] Date [place]: 05/07/19 Time [place]: [id number] Representative spoke with at [place]: Dorian Pod updated on d/c home today.  Social Determinants of Health (SDOH) Interventions     Readmission Risk Interventions No flowsheet data found.

## 2019-05-07 NOTE — Discharge Summary (Signed)
Physician Discharge Summary  Jon [name] ZOX:[id number] DOB: 09/15/1955 DOA: 05/03/2019  PCP: Elias Else, MD  Admit date: 05/03/2019 Discharge date: 05/07/2019  Admitted From: Home Disposition: Home Recommendations for Outpatient Follow-up:  1. Follow up with PCP in 1-2 weeks 2. Please obtain BMP/CBC in one week 3. Please follow up [facility]  Home Health: PT and OT  equipment/Devices: None  discharge Condition stable CODE STATUS: Full code Diet recommendation: Cardiac diet   brief/Interim Summary:63 year old male with history of hemorrhagic stroke 3 years ago with residual left-sided weakness  admitted with new onset seizure on 05/03/2019  Discharge Diagnoses:  Active Problems:   New onset seizure (HCC)   History of hemorrhagic stroke with residual hemiplegia (HCC)  #1 new onset seizures in a patient with history of hemorrhagic stroke-neurology has seen the patient and thinks baclofen Lowered threshold for seizure.    Baclofen was stopped.  He was started on Valium in place of baclofen.  He will be discharged on Valium as an outpatient.  Is also treated with Keppra 500 mg twice a day.  He has not had any further seizures.    Patient cannot drive for another 6 months due to recent seizure.  Patient was seen by physical therapy and recommends home PT and OT.  Patient needs speech therapy for gait training transfer training and stair training.  He is unable to leave home without assistance.  And he has unsafe ambulation due to balance issues and left-sided hemiparesis from previous stroke.  #2 hypokalemia/hypomagnesemia--repleted on the day of discharge his potassium was 3.9 magnesium was 2.2.  #htn stable on beta blocker  #history of alcohol use -quit 3 years ago   Estimated body mass index is 19.74 kg/m as calculated from the following:   Height as of 04/04/19:  (1.778 m).   Weight as of this encounter: 62.4 kg.  Discharge Instructions  Discharge  Instructions    Call MD for:  difficulty breathing, headache or visual disturbances   Complete by: As directed    Call MD for:  persistant nausea and vomiting   Complete by: As directed    Call MD for:  temperature >100.4   Complete by: As directed    Diet - low sodium heart healthy   Complete by: As directed    Increase activity slowly   Complete by: As directed      Allergies as of 05/07/2019      Reactions   Gluten Meal Nausea And Vomiting   Lactose Intolerance (gi) Nausea And Vomiting   Other    Tree nuts   Shellfish Allergy Swelling, Rash      Medication List    STOP taking these medications   baclofen 20 MG tablet Commonly known as: LIORESAL     TAKE these medications   aspirin EC 81 MG tablet Take 1 tablet (81 mg total) by mouth daily.   Cialis 5 MG tablet Generic drug: tadalafil Take 5 mg by mouth daily.   diazepam 2 MG tablet Commonly known as: VALIUM Take 1 tablet (2 mg total) by mouth 2 (two) times daily.   fluticasone 50 MCG/ACT nasal spray Commonly known as: FLONASE Place 1 spray into both nostrils daily. Start taking on: May 08, 2019   folic acid 1 MG tablet Commonly known as: FOLVITE Take 1 tablet (1 mg total) by mouth daily. What changed: when to take this   levETIRAcetam 500 MG tablet Commonly known as: KEPPRA Take 1 tablet (500 mg total)  by mouth 2 (two) times daily.   metoprolol tartrate 25 MG tablet Commonly known as: LOPRESSOR Take 1 tablet (25 mg total) by mouth 2 (two) times daily. What changed: medication strength   multivitamin with minerals Tabs tablet Take 1 tablet by mouth daily.   pravastatin 40 MG tablet Commonly known as: PRAVACHOL Take 1 tablet (40 mg total) by mouth daily at 6 PM.   QUEtiapine 25 MG tablet Commonly known as: SEROQUEL Take 0.5 tablets (12.5 mg total) by mouth at bedtime.   sertraline 50 MG tablet Commonly known as: ZOLOFT Take 1 tablet (50 mg total) by mouth daily. What changed: when to take  this      Follow-up Information    [facility] Follow up in 4 week(s).   Specialty: Neurology Why: 4-6  weeks after discharge for seizures Contact information: [address] [place] [id number] [phone]       [facility] Follow up.   Specialty: Home Health Services Why: physcial and occupational therapy  Contact information: [address] [place] [id number] [phone]        Elias Elseeade, Robert, MD Follow up.   Specialty: Family Medicine Contact information: [phone] [address] [place] [id number] [phone]          Allergies  Allergen Reactions  . Gluten Meal Nausea And Vomiting  . Lactose Intolerance (Gi) Nausea And Vomiting  . Other     Tree nuts   . Shellfish Allergy Swelling and Rash    Consultations: PCCM and neurology   Procedures/Studies: Ct Head Wo Contrast  Result Date: 05/03/2019 CLINICAL DATA:  Altered mental status.  Seizure-like activity. EXAM: CT HEAD WITHOUT CONTRAST TECHNIQUE: Contiguous axial images were obtained from the base of the skull through the vertex without intravenous contrast. COMPARISON:  January 08 2016 FINDINGS: Brain: No evidence of acute infarction, hemorrhage, hydrocephalus, extra-axial collection or mass lesion/mass effect. There is mild chronic diffuse atrophy. Vascular: No hyperdense vessel is noted. Skull: Normal. Negative for fracture or focal lesion. Sinuses/Orbits: Small right maxillary sinus retention cyst is identified. The other visualized sinuses are clear. The orbits are normal. Other: None. IMPRESSION: No focal acute intracranial abnormality identified. Chronic diffuse atrophy. Electronically Signed   By: Sherian ReinWei-Chen  Lin M.D.   On: 05/03/2019 08:02   Mr Brain Wo Contrast  Result Date: 05/04/2019 CLINICAL DATA:  Encephalopathy. New onset seizure. History of hemorrhagic stroke. EXAM: MRI HEAD WITHOUT CONTRAST TECHNIQUE: Multiplanar,  multiecho pulse sequences of the brain and surrounding structures were obtained without intravenous contrast. COMPARISON:  Head CT 05/03/2019 and MRI 05/25/2016 FINDINGS: Brain: No acute infarct, mass, midline shift, or extra-axial fluid collection is identified. Encephalomalacia and hemosiderin staining are again noted in the right basal ganglia and adjacent white matter related to a remote hemorrhage. There is associated ex vacuo dilatation of the right lateral ventricle and wallerian degeneration in the right midbrain. The left cerebral hemisphere is normal in signal, as is the cerebellum. There is mild global cerebral atrophy. The right hippocampus is mildly smaller than the left without signal abnormality. Vascular: Major intracranial vascular flow voids are preserved. Skull and upper cervical spine: Unremarkable bone marrow signal. Sinuses/Orbits: Unremarkable orbits. Small right maxillary sinus mucous retention cysts. Small bilateral mastoid effusions. Other: None. IMPRESSION: 1. No acute intracranial abnormality. 2. Encephalomalacia related to a remote right basal ganglia hemorrhage. Electronically Signed   By: Sebastian AcheAllen  [place]ady M.D.   On: 05/04/2019 13:26  Dg Chest Port 1 View  Result Date: 05/03/2019 CLINICAL DATA:  Altered mental status, seizure EXAM: PORTABLE CHEST 1 VIEW COMPARISON:  01/09/2016 FINDINGS: Cardiomegaly. Both lungs are clear. The visualized skeletal structures are unremarkable. IMPRESSION: Cardiomegaly without acute abnormality of the lungs in AP portable projection. Electronically Signed   By: Lauralyn Primes M.D.   On: 05/03/2019 09:47    (Echo, Carotid, EGD, Colonoscopy, ERCP)    Subjective: Patient resting in bed in no acute distress he denies any new complaints today anxious to go home   Discharge Exam: Vitals:   05/07/19 0700 05/07/19 0825  BP:    Pulse:  98  Resp:  16  Temp: 98.6 F (37 C)   SpO2:  97%   Vitals:   05/06/19 2336 05/07/19 0403 05/07/19 0700 05/07/19  0825  BP:      Pulse:    98  Resp:    16  Temp: 99 F (37.2 C) 98.6 F (37 C) 98.6 F (37 C)   TempSrc: Oral Oral Oral   SpO2:    97%  Weight:        General: Pt is alert, awake, not in acute distress Cardiovascular: RRR, S1/S2 +, no rubs, no gallops Respiratory: CTA bilaterally, no wheezing, no rhonchi Abdominal: Soft, NT, ND, bowel sounds + Extremities: no edema, no cyanosis    The results of significant diagnostics from this hospitalization (including imaging, microbiology, ancillary and laboratory) are listed below for reference.     Microbiology: Recent Results (from the past 240 hour(s))  SARS CORONAVIRUS 2 (TAT 6-24 HRS) Nasopharyngeal Nasopharyngeal Swab     Status: None   Collection Time: 05/03/19  8:07 AM   Specimen: Nasopharyngeal Swab  Result Value Ref Range Status   SARS Coronavirus 2 NEGATIVE NEGATIVE Final    Comment: (NOTE) SARS-CoV-2 target nucleic acids are NOT DETECTED. The SARS-CoV-2 RNA is generally detectable in upper and lower respiratory specimens during the acute phase of infection. Negative results do not preclude SARS-CoV-2 infection, do not rule out co-infections with other pathogens, and should not be used as the sole basis for treatment or other patient management decisions. Negative results must be combined with clinical observations, patient history, and epidemiological information. The expected result is Negative. Fact Sheet for Patients: HairSlick.no Fact Sheet for Healthcare Providers: quierodirigir.com This test is not yet approved or cleared by the [place] FDA and  has been authorized for detection and/or diagnosis of SARS-CoV-2 by FDA under an Emergency Use Authorization (EUA). This EUA will remain  in effect (meaning this test can be used) for the duration of the COVID-19 declaration under Section 56 4(b)(1) of the Act, 21 U.S.C. section 360bbb-3(b)(1), unless the  authorization is terminated or revoked sooner. Performed at [facility], [address], [place], [place] [address]   Urine culture     Status: None   Collection Time: 05/03/19 12:35 PM   Specimen: Urine, Random  Result Value Ref Range Status   Specimen Description URINE, RANDOM  Final   Special Requests NONE  Final   Culture   Final    NO GROWTH Performed at [facility], [address], [place], [place] [address]    Report Status 05/04/2019 FINAL  Final  CSF culture     Status: None   Collection Time: 05/03/19  1:36 PM   Specimen: CSF; Cerebrospinal Fluid  Result Value Ref Range Status   Specimen Description CSF  Final   Special Requests NONE  Final   Gram Stain   Final    WBC PRESENT, PREDOMINANTLY MONONUCLEAR NO WBC SEEN CYTOSPIN SMEAR    Culture   Final    NO GROWTH Performed at [facility], [place] [address] [place], [place] [address]    Report Status 05/06/2019 FINAL  Final  MRSA PCR Screening     Status: None   Collection Time: 05/03/19  3:53 PM   Specimen: Nasal Mucosa; Nasopharyngeal  Result Value Ref Range Status   MRSA by PCR NEGATIVE NEGATIVE Final    Comment:        The GeneXpert MRSA Assay (FDA approved for NASAL specimens only), is one component of a comprehensive MRSA colonization surveillance program. It is not intended to diagnose MRSA infection nor to guide or monitor treatment for MRSA infections. Performed at [facility], [place] [address], [place], [place] [address]      Labs: BNP (last 3 results) No results for input(s): BNP in the last 8760 hours. Basic Metabolic Panel: Recent Labs  Lab 05/03/19 0707 05/03/19 0721 05/04/19 0245 05/05/19 0346 05/06/19 0404 05/07/19 0356  NA 140 141 144  --  141 139  K 3.9 3.8 4.0  --  3.0* 3.9  CL 103 107 111  --  109 104  CO2 16*  --  23  --  21* 21*  GLUCOSE 161* 155* 98  --  75 64*  BUN 6* 7* <5*  --  5* 5*  CREATININE 0.92 0.70 0.72  --  0.73 0.66  CALCIUM 9.1  --   8.9  --  8.3* 8.6*  MG 2.0  --  1.8 1.2* 1.7 2.2  PHOS 2.9  --  1.5* 1.8* 2.5  --    Liver Function Tests: Recent Labs  Lab 05/03/19 0707  AST 22  ALT 14  ALKPHOS 53  BILITOT 1.1  PROT 7.3  ALBUMIN 4.2   No results for input(s): LIPASE, AMYLASE in the last 168 hours. Recent Labs  Lab 05/03/19 0807  AMMONIA 27   CBC: Recent Labs  Lab 05/03/19 0707 05/03/19 0721 05/04/19 0245 05/06/19 0404  WBC 10.6*  --  10.6* 8.2  NEUTROABS 5.6  --   --   --   HGB 15.1 15.6 14.5 13.3  HCT 45.9 46.0 43.7 38.1*  MCV 96.2  --  94.4 90.3  PLT 191  --  136* 123*   Cardiac Enzymes: Recent Labs  Lab 05/03/19 0707  CKTOTAL 41*   BNP: Invalid input(s): POCBNP CBG: Recent Labs  Lab 05/04/19 2005 05/05/19 0018 05/05/19 0554 05/05/19 0756 05/05/19 0919  GLUCAP 91 84 75 65* 132*   D-Dimer No results for input(s): DDIMER in the last 72 hours. Hgb A1c No results for input(s): HGBA1C in the last 72 hours. Lipid Profile No results for input(s): CHOL, HDL, LDLCALC, TRIG, CHOLHDL, LDLDIRECT in the last 72 hours. Thyroid function studies No results for input(s): TSH, T4TOTAL, T3FREE, THYROIDAB in the last 72 hours.  Invalid input(s): FREET3 Anemia work up No results for input(s): VITAMINB12, FOLATE, FERRITIN, TIBC, IRON, RETICCTPCT in the last 72 hours. Urinalysis    Component Value Date/Time   COLORURINE YELLOW 05/03/2019 0810   APPEARANCEUR CLEAR 05/03/2019 0810   LABSPEC 1.018 05/03/2019 0810   PHURINE 5.0 05/03/2019 0810   GLUCOSEU NEGATIVE 05/03/2019 0810   HGBUR SMALL (A) 05/03/2019 0810   BILIRUBINUR NEGATIVE 05/03/2019 0810   KETONESUR 5 (A) 05/03/2019 0810   PROTEINUR 30 (A) 05/03/2019 0810  NITRITE NEGATIVE 05/03/2019 0810   LEUKOCYTESUR NEGATIVE 05/03/2019 0810   Sepsis Labs Invalid input(s): PROCALCITONIN,  WBC,  LACTICIDVEN Microbiology Recent Results (from the past 240 hour(s))  SARS CORONAVIRUS 2 (TAT 6-24 HRS) Nasopharyngeal Nasopharyngeal Swab      Status: None   Collection Time: 05/03/19  8:07 AM   Specimen: Nasopharyngeal Swab  Result Value Ref Range Status   SARS Coronavirus 2 NEGATIVE NEGATIVE Final    Comment: (NOTE) SARS-CoV-2 target nucleic acids are NOT DETECTED. The SARS-CoV-2 RNA is generally detectable in upper and lower respiratory specimens during the acute phase of infection. Negative results do not preclude SARS-CoV-2 infection, do not rule out co-infections with other pathogens, and should not be used as the sole basis for treatment or other patient management decisions. Negative results must be combined with clinical observations, patient history, and epidemiological information. The expected result is Negative. Fact Sheet for Patients: HairSlick.no Fact Sheet for Healthcare Providers: quierodirigir.com This test is not yet approved or cleared by the [place] FDA and  has been authorized for detection and/or diagnosis of SARS-CoV-2 by FDA under an Emergency Use Authorization (EUA). This EUA will remain  in effect (meaning this test can be used) for the duration of the COVID-19 declaration under Section 56 4(b)(1) of the Act, 21 U.S.C. section 360bbb-3(b)(1), unless the authorization is terminated or revoked sooner. Performed at [facility], [address], [place], [place] [address]   Urine culture     Status: None   Collection Time: 05/03/19 12:35 PM   Specimen: Urine, Random  Result Value Ref Range Status   Specimen Description URINE, RANDOM  Final   Special Requests NONE  Final   Culture   Final    NO GROWTH Performed at [facility], [address], [place], [place] [address]    Report Status 05/04/2019 FINAL  Final  CSF culture     Status: None   Collection Time: 05/03/19  1:36 PM   Specimen: CSF; Cerebrospinal Fluid  Result Value Ref Range Status   Specimen Description CSF  Final   Special Requests NONE  Final   Gram  Stain   Final    WBC PRESENT, PREDOMINANTLY MONONUCLEAR NO WBC SEEN CYTOSPIN SMEAR    Culture   Final    NO GROWTH Performed at [facility], [address], [place], [place] [address]    Report Status 05/06/2019 FINAL  Final  MRSA PCR Screening     Status: None   Collection Time: 05/03/19  3:53 PM   Specimen: Nasal Mucosa; Nasopharyngeal  Result Value Ref Range Status   MRSA by PCR NEGATIVE NEGATIVE Final    Comment:        The GeneXpert MRSA Assay (FDA approved for NASAL specimens only), is one component of a comprehensive MRSA colonization surveillance program. It is not intended to diagnose MRSA infection nor to guide or monitor treatment for MRSA infections. Performed at [facility], [address], [place], [place] [address]      Time coordinating discharge:  39 minutes  SIGNED:   Alwyn Ren, MD  [facility] 05/07/2019, 10:11 AM Pager   If 7PM-7AM, please contact night-coverage [email or web address] Password [email or web address]

## 2019-05-07 NOTE — Progress Notes (Signed)
Patient arrived to 3W02 A&O x4. Denies pain. Wife at bedside. POC provided to patient. Nurse will continue to monitor.

## 2019-05-07 NOTE — TOC Transition Note (Signed)
Transition of Care [facility]) - CM/SW Discharge Note   Patient Details  Name: JAYIN DEROUSSE MRN: [id number] Date of Birth: [birth year]  [facility] CM/SW Contact:  Bartholomew Crews, RN Phone Number: [phone] 05/07/2019, 10:43 AM   Clinical Narrative:    Patient to transition home today. DC orders complete. Notified liaison at [facility] of dc orders. No further transition of care needs identified.    Final next level of care: [place] Barriers to Discharge: No Barriers Identified   Patient Goals and CMS Choice Patient states their goals for this hospitalization and ongoing recovery are:: wanting to go home CMS Medicare.gov Compare Post Acute Care list provided to:: Patient Choice offered to / list presented to : Patient, Spouse  Discharge Placement                       Discharge Plan and Services In-house Referral: NA Discharge Planning Services: CM Consult Post Acute Care Choice: Home Health          DME Arranged: N/A DME Agency: NA       HH Arranged: PT, OT [place] Agency: [facility] [place] Date [place]: 05/07/19 Time [place]: [id number] Representative spoke with at [place]: [place] (SDOH) Interventions     Readmission Risk Interventions No flowsheet data found.

## 2019-05-07 NOTE — Progress Notes (Signed)
SLP Cancellation Note  Patient Details Name: Jon [name] MRN: [id number] DOB: 01/14/1956   Cancelled treatment:       Reason Eval/Treat Not Completed: Patient was OTF at this time.  ST will f/u as schedule allows.    Bretta Bang, M.S., [facility] Office: [phone]   [place] 05/07/2019, 11:09 AM

## 2019-05-08 ENCOUNTER — Telehealth: Payer: Self-pay | Admitting: Neurology

## 2019-05-08 ENCOUNTER — Telehealth: Payer: Self-pay | Admitting: Diagnostic Neuroimaging

## 2019-05-08 NOTE — Telephone Encounter (Addendum)
Chart reviewed  I saw him on Jan 2018 for evaluation of BOTOX injection for spastic left hemiparesis following his right basal ganglia hemorrhagic stroke,  He continued his Botox injection by rehabilitation Dr. Kittie Plater, never came back to our clinic after initial visit.  It is okay to reschedule his Dr. Leta Baptist

## 2019-05-08 NOTE — Telephone Encounter (Signed)
I called pts wife back about if pt can take seroquel or zoloft together. She stated pt already taking zoloft before being hospitalized.The hospital MD prescribed pts seroquel and he was discharge with the medication. I advise pts wife the two medications are not manage by our office and pt was last seen 102018. I advise the wife to call his PCP to get further advice. The wife stated she call the PCP yesterday but they have not call her back, so she call us. She wanted to know can both be taken together. I advise the wife to call the PCP office back and speak with the nurse or CMA. She verbalized understanding. I advise her that new pt referrals have the call about r/s pt with a different provider.The wife verbalized understanding.

## 2019-05-08 NOTE — Telephone Encounter (Signed)
Pt called wanting to speak to RN to be advised on what she should look out for symptom wise for the pt until his scheduled appt. Please advise.

## 2019-05-08 NOTE — Telephone Encounter (Signed)
Pt is a returning pt of Dr. Greer Pickerel. He is being referred back to [place] from the ER for a Follow up in 4-6 weeks. He is requesting to switch Drs. Is it ok to Switch him from [employer] to [facility]? If so what day and time would be the best to schedule? Thank you

## 2019-05-08 NOTE — Telephone Encounter (Signed)
Pt wife (on [place]) is asking if pt can take QUEtiapine (SEROQUEL) 25 MG tablet & sertraline (ZOLOFT) 50 MG tablet together, please call.

## 2019-05-08 NOTE — Telephone Encounter (Signed)
Ok to setup with me. -VRP

## 2019-05-08 NOTE — Telephone Encounter (Signed)
Dr. Krista Blue saw patient in 2018 for spasticity and botox eval. -VRP

## 2019-05-08 NOTE — Telephone Encounter (Signed)
Spoke with wife and advised her that he is on Keppra to prevent seizures. Advised he take 12 hours apart, stay well hydrated, eat healthy, get plenty of rest. Advised her that often seizures come on without warning signs. Advised she call before his appointment in Nov with any concerns, questions. She verbalized understanding, appreciation.

## 2019-05-10 DIAGNOSIS — E785 Hyperlipidemia, unspecified: Secondary | ICD-10-CM | POA: Diagnosis not present

## 2019-05-10 DIAGNOSIS — I69354 Hemiplegia and hemiparesis following cerebral infarction affecting left non-dominant side: Secondary | ICD-10-CM | POA: Diagnosis not present

## 2019-05-10 DIAGNOSIS — Z87440 Personal history of urinary (tract) infections: Secondary | ICD-10-CM | POA: Diagnosis not present

## 2019-05-10 DIAGNOSIS — E669 Obesity, unspecified: Secondary | ICD-10-CM | POA: Diagnosis not present

## 2019-05-10 DIAGNOSIS — F329 Major depressive disorder, single episode, unspecified: Secondary | ICD-10-CM | POA: Diagnosis not present

## 2019-05-10 DIAGNOSIS — R569 Unspecified convulsions: Secondary | ICD-10-CM | POA: Diagnosis not present

## 2019-05-10 DIAGNOSIS — Z87891 Personal history of nicotine dependence: Secondary | ICD-10-CM | POA: Diagnosis not present

## 2019-05-10 DIAGNOSIS — Z7982 Long term (current) use of aspirin: Secondary | ICD-10-CM | POA: Diagnosis not present

## 2019-05-10 DIAGNOSIS — I69398 Other sequelae of cerebral infarction: Secondary | ICD-10-CM | POA: Diagnosis not present

## 2019-05-10 DIAGNOSIS — Z7951 Long term (current) use of inhaled steroids: Secondary | ICD-10-CM | POA: Diagnosis not present

## 2019-05-10 DIAGNOSIS — F1020 Alcohol dependence, uncomplicated: Secondary | ICD-10-CM | POA: Diagnosis not present

## 2019-05-10 DIAGNOSIS — M62838 Other muscle spasm: Secondary | ICD-10-CM | POA: Diagnosis not present

## 2019-05-10 DIAGNOSIS — I69364 Other paralytic syndrome following cerebral infarction affecting left non-dominant side: Secondary | ICD-10-CM | POA: Diagnosis not present

## 2019-05-10 DIAGNOSIS — I10 Essential (primary) hypertension: Secondary | ICD-10-CM | POA: Diagnosis not present

## 2019-05-10 DIAGNOSIS — Z9181 History of falling: Secondary | ICD-10-CM | POA: Diagnosis not present

## 2019-05-20 DIAGNOSIS — F332 Major depressive disorder, recurrent severe without psychotic features: Secondary | ICD-10-CM | POA: Diagnosis not present

## 2019-05-20 DIAGNOSIS — Z23 Encounter for immunization: Secondary | ICD-10-CM | POA: Diagnosis not present

## 2019-05-20 DIAGNOSIS — G40909 Epilepsy, unspecified, not intractable, without status epilepticus: Secondary | ICD-10-CM | POA: Diagnosis not present

## 2019-05-27 ENCOUNTER — Ambulatory Visit: Payer: Medicare Other | Admitting: Physical Medicine & Rehabilitation

## 2019-05-31 ENCOUNTER — Encounter: Payer: Medicare Other | Attending: Physical Medicine & Rehabilitation | Admitting: Physical Medicine & Rehabilitation

## 2019-05-31 ENCOUNTER — Other Ambulatory Visit: Payer: Self-pay

## 2019-05-31 ENCOUNTER — Encounter: Payer: Self-pay | Admitting: Physical Medicine & Rehabilitation

## 2019-05-31 VITALS — BP 117/70 | HR 81 | Temp 97.7°F | Ht 70.0 in | Wt 155.0 lb

## 2019-05-31 DIAGNOSIS — I639 Cerebral infarction, unspecified: Secondary | ICD-10-CM | POA: Diagnosis present

## 2019-05-31 DIAGNOSIS — R252 Cramp and spasm: Secondary | ICD-10-CM | POA: Insufficient documentation

## 2019-05-31 DIAGNOSIS — G8194 Hemiplegia, unspecified affecting left nondominant side: Secondary | ICD-10-CM | POA: Insufficient documentation

## 2019-05-31 DIAGNOSIS — I10 Essential (primary) hypertension: Secondary | ICD-10-CM | POA: Diagnosis not present

## 2019-05-31 DIAGNOSIS — Z5189 Encounter for other specified aftercare: Secondary | ICD-10-CM | POA: Diagnosis not present

## 2019-05-31 DIAGNOSIS — I629 Nontraumatic intracranial hemorrhage, unspecified: Secondary | ICD-10-CM | POA: Insufficient documentation

## 2019-05-31 DIAGNOSIS — G8110 Spastic hemiplegia affecting unspecified side: Secondary | ICD-10-CM | POA: Diagnosis not present

## 2019-05-31 DIAGNOSIS — F1010 Alcohol abuse, uncomplicated: Secondary | ICD-10-CM | POA: Insufficient documentation

## 2019-05-31 NOTE — Progress Notes (Signed)
Botox Injection for spasticity using needle EMG guidance  Dilution: 50 Units/ml Indication: Severe spasticity which interferes with ADL,mobility and/or  hygiene and is unresponsive to medication management and other conservative care Informed consent was obtained after describing risks and benefits of the procedure with the patient. This includes bleeding, bruising, infection, excessive weakness, or medication side effects. A REMS form is on file and signed. Needle: 25g 2" needle electrode Number of units per muscle  LEFT dominant  FDS50 FDP 50 FCR 50  FCU 25 FPL 25 All injections were done after obtaining appropriate EMG activity and after negative drawback for blood. The patient tolerated the procedure well. Post procedure instructions were given. A followup appointment was made.

## 2019-05-31 NOTE — Patient Instructions (Signed)

## 2019-06-04 DIAGNOSIS — R3915 Urgency of urination: Secondary | ICD-10-CM | POA: Diagnosis not present

## 2019-06-04 DIAGNOSIS — Z125 Encounter for screening for malignant neoplasm of prostate: Secondary | ICD-10-CM | POA: Diagnosis not present

## 2019-06-04 DIAGNOSIS — R350 Frequency of micturition: Secondary | ICD-10-CM | POA: Diagnosis not present

## 2019-06-04 DIAGNOSIS — N401 Enlarged prostate with lower urinary tract symptoms: Secondary | ICD-10-CM | POA: Diagnosis not present

## 2019-06-17 DIAGNOSIS — H35033 Hypertensive retinopathy, bilateral: Secondary | ICD-10-CM | POA: Diagnosis not present

## 2019-06-17 DIAGNOSIS — H02834 Dermatochalasis of left upper eyelid: Secondary | ICD-10-CM | POA: Diagnosis not present

## 2019-06-17 DIAGNOSIS — H02831 Dermatochalasis of right upper eyelid: Secondary | ICD-10-CM | POA: Diagnosis not present

## 2019-06-17 DIAGNOSIS — G43809 Other migraine, not intractable, without status migrainosus: Secondary | ICD-10-CM | POA: Diagnosis not present

## 2019-06-18 ENCOUNTER — Telehealth: Payer: Self-pay | Admitting: *Deleted

## 2019-06-18 ENCOUNTER — Inpatient Hospital Stay: Payer: BLUE CROSS/BLUE SHIELD | Admitting: Diagnostic Neuroimaging

## 2019-06-18 NOTE — Telephone Encounter (Signed)
Patient was no show for new patient appointment today. 

## 2019-06-19 ENCOUNTER — Encounter: Payer: Self-pay | Admitting: Diagnostic Neuroimaging

## 2019-07-05 ENCOUNTER — Other Ambulatory Visit: Payer: Self-pay

## 2019-07-05 ENCOUNTER — Encounter: Payer: Self-pay | Admitting: Diagnostic Neuroimaging

## 2019-07-05 ENCOUNTER — Ambulatory Visit: Payer: Medicare Other | Admitting: Diagnostic Neuroimaging

## 2019-07-05 VITALS — BP 105/64 | HR 68 | Temp 98.5°F | Ht 70.0 in | Wt 147.0 lb

## 2019-07-05 DIAGNOSIS — R569 Unspecified convulsions: Secondary | ICD-10-CM | POA: Diagnosis not present

## 2019-07-05 DIAGNOSIS — I610 Nontraumatic intracerebral hemorrhage in hemisphere, subcortical: Secondary | ICD-10-CM | POA: Diagnosis not present

## 2019-07-05 MED ORDER — LEVETIRACETAM 500 MG PO TABS: 500.0000 mg | ORAL_TABLET | Freq: Two times a day (BID) | ORAL | 4 refills | Status: DC

## 2019-07-05 NOTE — Progress Notes (Signed)
[facility]  PATIENT: Jon [name] DOB: October 29, 1955  REFERRING CLINICIAN: Roda ShuttersXu / hospital follow up HISTORY FROM: patient and wife and chart review  REASON FOR VISIT: new consult    HISTORICAL  CHIEF COMPLAINT:  Chief Complaint  Patient presents with  . New Consult    Rm 7, wife  . new onset seizure, hx stroke    no seizure since first one    HISTORY OF PRESENT ILLNESS:   63 year old male with history of right basal ganglia intracerebral hemorrhage in 2017, here for evaluation of seizure.  Patient had new onset seizure on 05/03/2019, early in the morning when he was sleeping.  Patient started shaking and this woke up his wife.  She called EMS who arrived on scene.  Patient was confused and combative.  Patient went to the hospital for evaluation and treatment.  Patient was transferred to ICU due to agitation and need for IV sedation.  No electrographic seizures on EEG.  Spinal tap ruled out CNS infection.  Patient had somewhat prolonged postictal state but gradually returned to baseline.  He was discharged on levetiracetam 500 mg twice a day.  Since that time he is doing well.  No further seizures.  He is gradually returning to himself.  He was having some depression initially which is improving.   REVIEW OF SYSTEMS: Full 14 system review of systems performed and negative with exception of: As per HPI.  Depression seizure dizziness numbness blurred vision increased thirst or urination problems.  ALLERGIES: Allergies  Allergen Reactions  . Gluten Meal Nausea And Vomiting  . Lactose Intolerance (Gi) Nausea And Vomiting  . Other     Tree nuts   . Shellfish Allergy Swelling and Rash    HOME MEDICATIONS: Outpatient Medications Prior to Visit  Medication Sig Dispense Refill  . aspirin EC 81 MG tablet Take 1 tablet (81 mg total) by mouth daily.    . diazepam (VALIUM) 2 MG tablet Take 1 tablet (2 mg total) by mouth 2 (two) times daily. 30 tablet 0  .  fluticasone (FLONASE) 50 MCG/ACT nasal spray Place 1 spray into both nostrils daily. 1 g 2  . folic acid (FOLVITE) 1 MG tablet Take 1 tablet (1 mg total) by mouth daily. (Patient taking differently: Take 1 mg by mouth 3 (three) times daily. ) 30 tablet 0  . levETIRAcetam (KEPPRA) 500 MG tablet Take 1 tablet (500 mg total) by mouth 2 (two) times daily. 60 tablet 2  . metoprolol tartrate (LOPRESSOR) 25 MG tablet Take 1 tablet (25 mg total) by mouth 2 (two) times daily. 60 tablet 1  . Multiple Vitamin (MULTIVITAMIN WITH MINERALS) TABS tablet Take 1 tablet by mouth daily.    . pravastatin (PRAVACHOL) 40 MG tablet Take 1 tablet (40 mg total) by mouth daily at 6 PM. 30 tablet 1  . QUEtiapine (SEROQUEL) 25 MG tablet Take 0.5 tablets (12.5 mg total) by mouth at bedtime. 30 tablet 1  . sertraline (ZOLOFT) 50 MG tablet Take 1 tablet (50 mg total) by mouth daily. (Patient taking differently: Take 50 mg by mouth at bedtime. ) 30 tablet 1  . Solifenacin Succinate (VESICARE PO) Take by mouth. Taking once daily Mg unknown    . tadalafil (CIALIS) 5 MG tablet Take 5 mg by mouth daily.      Facility-Administered Medications Prior to Visit  Medication Dose Route Frequency Provider Last Rate Last Admin  . gadopentetate dimeglumine (MAGNEVIST) injection 15 mL  15 mL Intravenous Once  PRN Marvel Plan, MD        PAST MEDICAL HISTORY: Past Medical History:  Diagnosis Date  . Hypertension   . Liver damage   . Stroke [facility])     PAST SURGICAL HISTORY: Past Surgical History:  Procedure Laterality Date  . TONSILLECTOMY AND ADENOIDECTOMY Bilateral    Age 12    FAMILY HISTORY: History reviewed. No pertinent family history.  SOCIAL HISTORY: Social History   Socioeconomic History  . Marital status: Married    Spouse name: Not on file  . Number of children: Not on file  . Years of education: Not on file  . Highest education level: Not on file  Occupational History  . Not on file  Tobacco Use  . Smoking  status: Former Smoker    Packs/day: 0.50    Years: 19.00    Pack years: 9.50    Types: Cigarettes    Quit date: 02/22/2000    Years since quitting: 19.3  . Smokeless tobacco: Never Used  Substance and Sexual Activity  . Alcohol use: No    Alcohol/week: 12.0 standard drinks    Types: 12 Glasses of wine per week    Comment: Wife reports chronic alcohol use. 01/12/16  . Drug use: No    Types: Marijuana    Comment: teens to early 20's  . Sexual activity: Not on file  Other Topics Concern  . Not on file  Social History Narrative  . Not on file   Social Determinants of Health   Financial Resource Strain:   . Difficulty of Paying Living Expenses: Not on file  Food Insecurity:   . Worried About Programme researcher, broadcasting/film/video in the Last Year: Not on file  . Ran Out of Food in the Last Year: Not on file  Transportation Needs:   . Lack of Transportation (Medical): Not on file  . Lack of Transportation (Non-Medical): Not on file  Physical Activity:   . Days of Exercise per Week: Not on file  . Minutes of Exercise per Session: Not on file  Stress:   . Feeling of Stress : Not on file  Social Connections:   . Frequency of Communication with Friends and Family: Not on file  . Frequency of Social Gatherings with Friends and Family: Not on file  . Attends Religious Services: Not on file  . Active Member of Clubs or Organizations: Not on file  . Attends Banker Meetings: Not on file  . Marital Status: Not on file  Intimate Partner Violence:   . Fear of Current or Ex-Partner: Not on file  . Emotionally Abused: Not on file  . Physically Abused: Not on file  . Sexually Abused: Not on file     PHYSICAL EXAM  GENERAL EXAM/CONSTITUTIONAL: Vitals:  Vitals:   07/05/19 1120  BP: 105/64  Pulse: 68  Temp: 98.5 F (36.9 C)  Weight: 147 lb (66.7 kg)  Height:  (1.778 m)     Body mass index is 21.09 kg/m. Wt Readings from Last 3 Encounters:  07/05/19 147 lb (66.7 kg)    05/31/19 155 lb (70.3 kg)  05/06/19 137 lb 9.1 oz (62.4 kg)     Patient is in no distress; well developed, nourished and groomed; neck is supple  CARDIOVASCULAR:  Examination of carotid arteries is normal; no carotid bruits  Regular rate and rhythm, no murmurs  Examination of peripheral vascular system by observation and palpation is normal  EYES:  Ophthalmoscopic exam of optic discs  and posterior segments is normal; no papilledema or hemorrhages  No exam data present  MUSCULOSKELETAL:  Gait, strength, tone, movements noted in Neurologic exam below  NEUROLOGIC: MENTAL STATUS:  No flowsheet data found.  awake, alert, oriented to person, place and time  recent and remote memory intact  normal attention and concentration  language fluent, comprehension intact, naming intact  fund of knowledge appropriate  CRANIAL NERVE:   2nd - no papilledema on fundoscopic exam  2nd, 3rd, 4th, 6th - pupils equal and reactive to light, visual fields full to confrontation, extraocular muscles intact, no nystagmus  5th - facial sensation symmetric  7th - facial strength symmetric  8th - hearing intact  9th - palate elevates symmetrically, uvula midline  11th - shoulder shrug symmetric  12th - tongue protrusion midline  MOTOR:   INCREASED TONE IN LUE AND LLE; LUE 2; LLE 2-3 PROX AND 1-2 DISTAL  RIGHT upper ext and low ext 5/5  normal bulk and tone, full strength in the BUE, BLE  SENSORY:   normal and symmetric to light touch  COORDINATION:   finger-nose-finger, fine finger movements --> CANNOT ON LEFT SIDE DUE TO WEAKNESS  REFLEXES:   deep tendon reflexes BRISK IN LEFT SIDE and symmetric  GAIT/STATION:   IN WHEEL CHAIR; LEFT HEMIPARETIC GAIT WITH CANE     DIAGNOSTIC DATA (LABS, IMAGING, TESTING) - I reviewed patient records, labs, notes, testing and imaging myself where available.  Lab Results  Component Value Date   WBC 8.2 05/06/2019   HGB 13.3  05/06/2019   HCT 38.1 (L) 05/06/2019   MCV 90.3 05/06/2019   PLT 123 (L) 05/06/2019      Component Value Date/Time   NA 139 05/07/2019 0356   K 3.9 05/07/2019 0356   CL 104 05/07/2019 0356   CO2 21 (L) 05/07/2019 0356   GLUCOSE 64 (L) 05/07/2019 0356   BUN 5 (L) 05/07/2019 0356   CREATININE 0.66 05/07/2019 0356   CALCIUM 8.6 (L) 05/07/2019 0356   PROT 7.3 05/03/2019 0707   ALBUMIN 4.2 05/03/2019 0707   AST 22 05/03/2019 0707   ALT 14 05/03/2019 0707   ALKPHOS 53 05/03/2019 0707   BILITOT 1.1 05/03/2019 0707   GFRNONAA >60 05/07/2019 0356   GFRAA >60 05/07/2019 0356   Lab Results  Component Value Date   CHOL 250 (H) 01/09/2016   HDL 51 01/09/2016   LDLCALC 157 (H) 01/09/2016   TRIG 212 (H) 01/09/2016   CHOLHDL 4.9 01/09/2016   Lab Results  Component Value Date   HGBA1C 5.5 01/09/2016   Lab Results  Component Value Date   VITAMINB12 449 01/09/2016   Lab Results  Component Value Date   TSH 3.682 05/03/2019    05/03/19 EEG - This study is suggestive of severe diffuse encephalopathy, likely secondary to the ativan use. No seizures or definite epileptiform discharges were seen throughout the recording. - The excessive beta activity seen in the background is most likely due to the effect of benzodiazepine and is a benign EEG pattern.  05/04/19 MRI brain [I reviewed images myself and agree with interpretation. -VRP]  1. No acute intracranial abnormality. 2. Encephalomalacia related to a remote right basal ganglia hemorrhage.    ASSESSMENT AND PLAN  63 y.o. year old male here with new onset seizure 05/03/2019, history of right basal ganglia intracerebral hemorrhage in 2017, now on levetiracetam 500 mg twice a day.  Dx:  1. New onset seizure (HCC)   2. Basal ganglia hemorrhage (HCC)  PLAN:  NEW ONSET SEIZURE (post stroke; 2017 right BG ICH) - continue levetiracetam 500mg  twice a day  - According to [place] law, you can not drive unless you are seizure /  syncope free for at least 6 months and under physician's care.   - Please maintain precautions. Do not participate in activities where a loss of awareness could harm you or someone else. No swimming alone, no tub bathing, no hot tubs, no driving, no operating motorized vehicles (cars, ATVs, motocycles, etc), lawnmowers, power tools or firearms. No standing at heights, such as rooftops, ladders or stairs. Avoid hot objects such as stoves, heaters, open fires. Wear a helmet when riding a bicycle, scooter, skateboard, etc. and avoid areas of traffic. Set your water heater to 120 degrees or less.  Meds ordered this encounter  Medications  . levETIRAcetam (KEPPRA) 500 MG tablet    Sig: Take 1 tablet (500 mg total) by mouth 2 (two) times daily.    Dispense:  180 tablet    Refill:  4   Return in about 8 months (around 03/04/2020) for with NP (Amy Lomax).  I reviewed images, labs, notes, records myself. I summarized findings and reviewed with patient, for this high risk condition (seizure) requiring high complexity decision making.    Penni Bombard, MD 80/09/4915, 91:50 AM Certified in Neurology, Neurophysiology and Neuroimaging  [facility] [address], [place], [place] [address] [phone]

## 2019-07-05 NOTE — Patient Instructions (Addendum)
NEW ONSET SEIZURE   - continue levetiracetam 500mg  twice a day  - According to [place] law, you can not drive unless you are seizure / syncope free for at least 6 months and under physician's care.   - Please maintain precautions. Do not participate in activities where a loss of awareness could harm you or someone else. No swimming alone, no tub bathing, no hot tubs, no driving, no operating motorized vehicles (cars, ATVs, motocycles, etc), lawnmowers, power tools or firearms. No standing at heights, such as rooftops, ladders or stairs. Avoid hot objects such as stoves, heaters, open fires. Wear a helmet when riding a bicycle, scooter, skateboard, etc. and avoid areas of traffic. Set your water heater to 120 degrees or less.  - eat 3 times per day  - avoid high fevers  - consistent sleep pattern

## 2019-07-12 ENCOUNTER — Encounter: Payer: Self-pay | Admitting: Physical Medicine & Rehabilitation

## 2019-07-12 ENCOUNTER — Other Ambulatory Visit: Payer: Self-pay

## 2019-07-12 ENCOUNTER — Encounter: Payer: Medicare Other | Attending: Physical Medicine & Rehabilitation | Admitting: Physical Medicine & Rehabilitation

## 2019-07-12 VITALS — BP 98/62 | HR 84 | Temp 97.9°F | Ht 71.0 in | Wt 161.0 lb

## 2019-07-12 DIAGNOSIS — R252 Cramp and spasm: Secondary | ICD-10-CM | POA: Insufficient documentation

## 2019-07-12 DIAGNOSIS — I10 Essential (primary) hypertension: Secondary | ICD-10-CM | POA: Insufficient documentation

## 2019-07-12 DIAGNOSIS — F1010 Alcohol abuse, uncomplicated: Secondary | ICD-10-CM | POA: Insufficient documentation

## 2019-07-12 DIAGNOSIS — I629 Nontraumatic intracranial hemorrhage, unspecified: Secondary | ICD-10-CM | POA: Diagnosis not present

## 2019-07-12 DIAGNOSIS — G8194 Hemiplegia, unspecified affecting left nondominant side: Secondary | ICD-10-CM | POA: Insufficient documentation

## 2019-07-12 DIAGNOSIS — G8110 Spastic hemiplegia affecting unspecified side: Secondary | ICD-10-CM | POA: Diagnosis not present

## 2019-07-12 DIAGNOSIS — Z5189 Encounter for other specified aftercare: Secondary | ICD-10-CM | POA: Diagnosis not present

## 2019-07-12 DIAGNOSIS — I639 Cerebral infarction, unspecified: Secondary | ICD-10-CM | POA: Diagnosis present

## 2019-07-12 NOTE — Progress Notes (Signed)
Subjective:    Patient ID: Jon [name], male    DOB: 07/08/1956, 63 y.o.   MRN: [id number] 63 year old male with history of right basal ganglia hemorrhage in 2015 with resultant chronic left spastic hemiplegia, left hemisensory deficits and  mild cognitive deficits  HPI   Patient returns today 6 weeks after his last botulinum toxin treatment.  He indicates that he is using exercise  equipment to help with shoulder mobility. It is a [employer] aer[employer]ne that he would like to grip.  He has handlebars that he can use for resistance training as well as the pedals.  After his Botox injection his grip strength is weaker  05/31/2019 Botox LEFT dominant  FDS50 FDP 50 FCR 50  FCU 25 FPL 25  Pain Inventory Average Pain 0 Pain Right Now 0 My pain is .  In the last 24 hours, has pain interfered with the following? General activity 0 Relation with others 0 Enjoyment of life 0 What TIME of day is your pain at its worst? na Sleep (in general) Good  Pain is worse with: na Pain improves with: na Relief from Meds: na  Mobility walk with assistance use a cane ability to climb steps?  no do you drive?  no  Function disabled: date disabled na I need assistance with the following:  meal prep, household duties and shopping  Neuro/Psych bladder control problems No problems in this area weakness numbness trouble walking spasms depression  Prior Studies Any changes since last visit?  no  Physicians involved in your care Any changes since last visit?  no   No family history on file. Social History   Socioeconomic History  . Marital status: Married    Spouse name: Not on file  . Number of children: Not on file  . Years of education: Not on file  . Highest education level: Not on file  Occupational History  . Not on file  Tobacco Use  . Smoking status: Former Smoker    Packs/day: 0.50    Years: 19.00    Pack years: 9.50    Types: Cigarettes    Quit date: 01/08/2016    Years since quitting: 3.5  . Smokeless tobacco: Never Used  Substance and Sexual Activity  . Alcohol use: Not Currently    Alcohol/week: 12.0 standard drinks    Types: 12 Glasses of wine per week    Comment: Wife reports chronic alcohol use. 01/12/16  . Drug use: No    Types: Marijuana    Comment: teens to early 20's  . Sexual activity: Not on file  Other Topics Concern  . Not on file  Social History Narrative   Lives home, retired.  Married.    Social Determinants of Health   Financial Resource Strain:   . Difficulty of Paying Living Expenses: Not on file  Food Insecurity:   . Worried About Charity fundraiser in the Last Year: Not on file  . Ran Out of Food in the Last Year: Not on file  Transportation Needs:   . Lack of Transportation (Medical): Not on file  . Lack of Transportation (Non-Medical): Not on file  Physical Activity:   . Days of Exercise per Week: Not on file  . Minutes of Exercise per Session: Not on file  Stress:   . Feeling of Stress : Not on file  Social Connections:   . Frequency of Communication with Friends and Family: Not on file  . Frequency of Social Gatherings with Friends  and Family: Not on file  . Attends Religious Services: Not on file  . Active Member of Clubs or Organizations: Not on file  . Attends Banker Meetings: Not on file  . Marital Status: Not on file   Past Surgical History:  Procedure Laterality Date  . TONSILLECTOMY AND ADENOIDECTOMY Bilateral    Age 84   Past Medical History:  Diagnosis Date  . Bladder disorder 10/08/2001  . Depression   . Hypertension   . Liver damage   . Stroke (HCC)    BP 98/62   Pulse 84   Temp 97.9 F (36.6 C)   Ht 5\' 11"  (1.803 m)   Wt 161 lb (73 kg)   SpO2 96%   BMI 22.45 kg/m   Opioid Risk Score:   Fall Risk Score:  `1  Depression screen PHQ 2/9  Depression screen [facility] 2/9 01/01/2019 01/01/2018 11/20/2017 10/06/2017 05/19/2016 04/21/2016  Decreased Interest 0 0 0 0 0 0  Down,  Depressed, Hopeless 0 0 0 0 0 0  PHQ - 2 Score 0 0 0 0 0 0     Review of Systems  Constitutional: Negative.   HENT: Negative.   Eyes: Negative.   Respiratory: Negative.   Cardiovascular: Negative.   Gastrointestinal: Negative.   Endocrine: Negative.   Genitourinary: Positive for difficulty urinating.  Musculoskeletal: Positive for gait problem and myalgias.  Skin: Negative.   Allergic/Immunologic: Negative.   Neurological: Positive for weakness and numbness.  Hematological: Negative.   Psychiatric/Behavioral: Positive for dysphoric mood.  All other systems reviewed and are negative.      Objective:   Physical Exam Constitutional:      Appearance: Normal appearance.  Neurological:     Mental Status: He is alert.   Tone Left upper extremity Finger flexors MAS 1/2 Thumb flexor MAS 1 Elbow flexor MAS 1 Wrist flexor MAS 2 Motor strength is 3 - at the deltoid bicep and tricep to minus finger flexors 0 finger extensors Ambulates with wide base quad cane left AFO has a slow gait widened base of support decreased step length on the left side as well as decreased stance phase duration on the left side        Assessment & Plan:   1.  History of right basal ganglia hemorrhage with left hemiparesis and spasticity  He needs to use his spasticity in order to grip his exercise equipment.  We discussed that his botulinum toxin dose can be adjusted. We will not inject the thumb flexor muscle and make the following adjustments to other muscle groups.  Botox LEFT dominant  FDS25 FDP 25 FCR 25  FCU 25  Repeat injection in 6 weeks

## 2019-07-16 ENCOUNTER — Other Ambulatory Visit: Payer: Self-pay

## 2019-07-16 ENCOUNTER — Encounter: Payer: Medicare Other | Admitting: Psychology

## 2019-07-16 ENCOUNTER — Encounter: Payer: Self-pay | Admitting: Psychology

## 2019-07-16 DIAGNOSIS — I69398 Other sequelae of cerebral infarction: Secondary | ICD-10-CM | POA: Diagnosis not present

## 2019-07-16 DIAGNOSIS — I10 Essential (primary) hypertension: Secondary | ICD-10-CM | POA: Diagnosis not present

## 2019-07-16 DIAGNOSIS — R252 Cramp and spasm: Secondary | ICD-10-CM | POA: Diagnosis not present

## 2019-07-16 DIAGNOSIS — G8110 Spastic hemiplegia affecting unspecified side: Secondary | ICD-10-CM

## 2019-07-16 DIAGNOSIS — G8194 Hemiplegia, unspecified affecting left nondominant side: Secondary | ICD-10-CM | POA: Diagnosis not present

## 2019-07-16 DIAGNOSIS — I69319 Unspecified symptoms and signs involving cognitive functions following cerebral infarction: Secondary | ICD-10-CM | POA: Diagnosis not present

## 2019-07-16 DIAGNOSIS — F331 Major depressive disorder, recurrent, moderate: Secondary | ICD-10-CM

## 2019-07-16 DIAGNOSIS — Z5189 Encounter for other specified aftercare: Secondary | ICD-10-CM | POA: Diagnosis not present

## 2019-07-16 DIAGNOSIS — I610 Nontraumatic intracerebral hemorrhage in hemisphere, subcortical: Secondary | ICD-10-CM | POA: Diagnosis not present

## 2019-07-16 DIAGNOSIS — R269 Unspecified abnormalities of gait and mobility: Secondary | ICD-10-CM

## 2019-07-16 DIAGNOSIS — F1010 Alcohol abuse, uncomplicated: Secondary | ICD-10-CM | POA: Diagnosis not present

## 2019-07-16 DIAGNOSIS — I629 Nontraumatic intracranial hemorrhage, unspecified: Secondary | ICD-10-CM | POA: Diagnosis not present

## 2019-07-16 NOTE — Progress Notes (Signed)
Patient:  Jon [name]   DOB: [birth year]  MR Number: [id number]  Location: [facility] PHYSICAL MEDICINE AND REHABILITATION [address] [place], [address] [id number] [facility] [place] [address] Dept: [phone]  Start: 11 AM End: 12 PM  In person visit  Provider/Observer:     Hershal Coria PsyD  Chief Complaint:      Chief Complaint  Patient presents with  . Cerebrovascular Accident  . Other    hemiplegia    Reason For Service:     LYDIA MENG presents as a 63 year old history of hypertension alcohol use.  The patient presented on January 08, 2016 with acute onset of headache blood pressure was elevated.  CT of the head showed right basal ganglia hemorrhage with 31 mL volume and 3 mm midline shift.  The patient has not been drinking since his stroke.  He has been attending outpatient physical therapy.  Ongoing issues with spasticity of his left upper extremity greater than left lower extremity.  The patient has had minimal response to baclofen.  The patient was referred by Dr. Wynn Banker for neuropsychological/psychological consultation.  The patient has continued to struggle with the loss of function following his stroke.  The patient reports that he thinks about his functional loss all of the time.  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  The patient has been continuing his art adjusting to his motor deficits the patient has been unable to play the guitar.  He can use his left arm or hand.  He has tried playing the keyboard with just his right hand but this is limited.  The patient reports that he is constantly thinking about playing songs in his head done this before.  However, he reports that now he cannot play them.  He reports that he is suggesting too much of the other issues but does have some cognitive issues related to attention and memory.  The patient reports that he  has completely quit drinking alcohol and quit smoking cigarettes and is lost 50 pounds.  He reports that his mood is okay except when he is around his wife and she starts singing again active and engaged.  The reason for service remains unchanged and the above information remains valid for this appointment.  It has been reviewed for this appointment.  Interventions Strategy:  Cognitive/behavioral therapeutic interventions as well as building coping skills and adaptive abilities following significant CVA.  Participation Level:   Active  Participation Quality:  Appropriate and Attentive      Behavioral Observation:  Well Groomed, Alert, and Appropriate.   Current Psychosocial Factors:  The patient reports that wife is helping as much as she can.  He reports that recent depressive episode occurred after seizure and "set him back to where he was two years prior" but these changes have improved and back near post stroke status.   Content of Session:   Reviewed current symptoms and continue to work on therapeutic interventions around issues of coping skills and adjustment skills.  Current Status:   The patient had a significant seizure in Oct.  In hospital for 3 days and unconscious.  Lead to depressive event with setback in motor deficits.  Reports SI during that time.  They have stopped and he is now getting back to post stroke baseline.    Patient Progress:   The patient continues to actively work on his  therapeutic and coping skills and we are continuing to work on further development in his expansion of his life activities including aspects of his art work.  The patient reports that he is continued to have more interactions with others and use of coping skills continue to improve.   Last Reviewed:   07/16/2019  Impression/Diagnosis:   The patient continues to have a lot of frustration with loss of function following his right basal ganglia hemorrhage and this is producing negative emotional  response and frustrations.  The patient is having difficulties with motivation etc.  Diagnosis:   Spastic hemiplegia affecting nondominant side (HCC)  Basal ganglia hemorrhage (HCC)  Gait disturbance, post-stroke  Cognitive deficit, post-stroke  Moderate episode of recurrent major depressive disorder ([place])

## 2019-08-13 ENCOUNTER — Encounter: Payer: Medicare Other | Admitting: Psychology

## 2019-09-03 ENCOUNTER — Encounter: Payer: Self-pay | Admitting: Physical Medicine & Rehabilitation

## 2019-09-03 ENCOUNTER — Other Ambulatory Visit: Payer: Self-pay

## 2019-09-03 ENCOUNTER — Encounter: Payer: Medicare Other | Attending: Physical Medicine & Rehabilitation | Admitting: Physical Medicine & Rehabilitation

## 2019-09-03 VITALS — BP 119/74 | HR 71

## 2019-09-03 DIAGNOSIS — G8110 Spastic hemiplegia affecting unspecified side: Secondary | ICD-10-CM | POA: Diagnosis not present

## 2019-09-03 DIAGNOSIS — G8194 Hemiplegia, unspecified affecting left nondominant side: Secondary | ICD-10-CM | POA: Diagnosis not present

## 2019-09-03 DIAGNOSIS — Z5189 Encounter for other specified aftercare: Secondary | ICD-10-CM | POA: Insufficient documentation

## 2019-09-03 DIAGNOSIS — F1010 Alcohol abuse, uncomplicated: Secondary | ICD-10-CM | POA: Diagnosis not present

## 2019-09-03 DIAGNOSIS — R252 Cramp and spasm: Secondary | ICD-10-CM | POA: Diagnosis not present

## 2019-09-03 DIAGNOSIS — I629 Nontraumatic intracranial hemorrhage, unspecified: Secondary | ICD-10-CM | POA: Insufficient documentation

## 2019-09-03 DIAGNOSIS — I639 Cerebral infarction, unspecified: Secondary | ICD-10-CM | POA: Diagnosis present

## 2019-09-03 DIAGNOSIS — I10 Essential (primary) hypertension: Secondary | ICD-10-CM | POA: Diagnosis not present

## 2019-09-03 NOTE — Progress Notes (Signed)
Botox Injection for spasticity using needle EMG guidance  Dilution: 50 Units/ml Indication: Severe spasticity which interferes with ADL,mobility and/or  hygiene and is unresponsive to medication management and other conservative care Informed consent was obtained after describing risks and benefits of the procedure with the patient. This includes bleeding, bruising, infection, excessive weakness, or medication side effects. A REMS form is on file and signed. Needle: 27g 1" needle electrode Number of units per muscle LEFT dominant  FDS25 FDP 25 FCR 25  FCU 25 All injections were done after obtaining appropriate EMG activity and after negative drawback for blood. The patient tolerated the procedure well. Post procedure instructions were given. A followup appointment was made.

## 2019-09-03 NOTE — Patient Instructions (Signed)

## 2019-09-16 ENCOUNTER — Encounter: Payer: Medicare Other | Admitting: Psychology

## 2019-09-16 DIAGNOSIS — I10 Essential (primary) hypertension: Secondary | ICD-10-CM | POA: Diagnosis not present

## 2019-09-16 DIAGNOSIS — E782 Mixed hyperlipidemia: Secondary | ICD-10-CM | POA: Diagnosis not present

## 2019-09-16 DIAGNOSIS — I69359 Hemiplegia and hemiparesis following cerebral infarction affecting unspecified side: Secondary | ICD-10-CM | POA: Diagnosis not present

## 2019-09-16 DIAGNOSIS — N400 Enlarged prostate without lower urinary tract symptoms: Secondary | ICD-10-CM | POA: Diagnosis not present

## 2019-10-14 ENCOUNTER — Encounter: Payer: Medicare Other | Admitting: Psychology

## 2019-10-15 ENCOUNTER — Encounter: Payer: Self-pay | Admitting: Physical Medicine & Rehabilitation

## 2019-10-15 ENCOUNTER — Other Ambulatory Visit: Payer: Self-pay

## 2019-10-15 ENCOUNTER — Encounter: Payer: Medicare Other | Attending: Physical Medicine & Rehabilitation | Admitting: Physical Medicine & Rehabilitation

## 2019-10-15 VITALS — BP 116/70 | HR 64 | Temp 98.2°F | Ht 70.0 in | Wt 164.8 lb

## 2019-10-15 DIAGNOSIS — Z5189 Encounter for other specified aftercare: Secondary | ICD-10-CM | POA: Diagnosis not present

## 2019-10-15 DIAGNOSIS — I10 Essential (primary) hypertension: Secondary | ICD-10-CM | POA: Insufficient documentation

## 2019-10-15 DIAGNOSIS — G8194 Hemiplegia, unspecified affecting left nondominant side: Secondary | ICD-10-CM | POA: Diagnosis not present

## 2019-10-15 DIAGNOSIS — I629 Nontraumatic intracranial hemorrhage, unspecified: Secondary | ICD-10-CM | POA: Insufficient documentation

## 2019-10-15 DIAGNOSIS — F1010 Alcohol abuse, uncomplicated: Secondary | ICD-10-CM | POA: Insufficient documentation

## 2019-10-15 DIAGNOSIS — G8110 Spastic hemiplegia affecting unspecified side: Secondary | ICD-10-CM | POA: Diagnosis not present

## 2019-10-15 DIAGNOSIS — R252 Cramp and spasm: Secondary | ICD-10-CM | POA: Diagnosis not present

## 2019-10-15 DIAGNOSIS — I639 Cerebral infarction, unspecified: Secondary | ICD-10-CM | POA: Diagnosis present

## 2019-10-15 NOTE — Patient Instructions (Signed)
Try taking diazepan 1 tablet at night rather tan 1/2 tab twice a day  Cont current dose of botox in May

## 2019-10-15 NOTE — Progress Notes (Signed)
Subjective:    Patient ID: Jon [name], male    DOB: 09-28-1955, 64 y.o.   MRN: [id number]  HPI  63 year old male with history of a right thalamic hemorrhage causing chronic left spastic hemiparesis.  The patient does not exercise with a machine at home and finds it useful to be able to grip with his left hand.  He had been getting Botox injections of the wrist and finger flexors as well as thumb flexor with a total of 200 units however this week into his hand muscle too much so that he could not use the machine.  6 weeks ago he underwent Botox injection and received a total of 100 units.  The following muscles were injected FCU 25 units FCR 25 units FDS 25 units FDP 25 units The patient states that he can still range his hand although it is a little bit more difficult. He has also had a couple episodes where he felt like his leg was tending to buckle.  This was after he discontinued baclofen.  He is off his baclofen due to concerns that it may lower seizure threshold. He has a history of a post stroke seizure disorder.  The patient has not tried tizanidine.  We discussed that this may be an alternative should he have more issues with tone. Pain Inventory Average Pain 0 Pain Right Now 0 My pain is na  In the last 24 hours, has pain interfered with the following? General activity 0 Relation with others 0 Enjoyment of life 0 What TIME of day is your pain at its worst? na Sleep (in general) Good  Pain is worse with: na Pain improves with: na Relief from Meds: 0  Mobility use a cane how many minutes can you walk? 10 ability to climb steps?  no do you drive?  no  Function I need assistance with the following:  meal prep, household duties and shopping  Neuro/Psych No problems in this area  Prior Studies Any changes since last visit?  no  Physicians involved in your care Any changes since last visit?  no Primary care . Neurologist . Rheumatologist .   No family  history on file. Social History   Socioeconomic History  . Marital status: Married    Spouse name: Not on file  . Number of children: Not on file  . Years of education: Not on file  . Highest education level: Not on file  Occupational History  . Not on file  Tobacco Use  . Smoking status: Former Smoker    Packs/day: 0.50    Years: 19.00    Pack years: 9.50    Types: Cigarettes    Quit date: 01/08/2016    Years since quitting: 3.7  . Smokeless tobacco: Never Used  Substance and Sexual Activity  . Alcohol use: Not Currently    Alcohol/week: 12.0 standard drinks    Types: 12 Glasses of wine per week    Comment: Wife reports chronic alcohol use. 01/12/16  . Drug use: No    Types: Marijuana    Comment: teens to early 20's  . Sexual activity: Not on file  Other Topics Concern  . Not on file  Social History Narrative   Lives home, retired.  Married.    Social Determinants of Health   Financial Resource Strain:   . Difficulty of Paying Living Expenses:   Food Insecurity:   . Worried About Programme researcher, broadcasting/film/video in the Last Year:   . [employer]  of Food in the Last Year:   Transportation Needs:   . Film/video editor (Medical):   Marland Kitchen Lack of Transportation (Non-Medical):   Physical Activity:   . Days of Exercise per Week:   . Minutes of Exercise per Session:   Stress:   . Feeling of Stress :   Social Connections:   . Frequency of Communication with Friends and Family:   . Frequency of Social Gatherings with Friends and Family:   . Attends Religious Services:   . Active Member of Clubs or Organizations:   . Attends Archivist Meetings:   Marland Kitchen Marital Status:    Past Surgical History:  Procedure Laterality Date  . TONSILLECTOMY AND ADENOIDECTOMY Bilateral    Age 20   Past Medical History:  Diagnosis Date  . Bladder disorder 10/08/2001  . Depression   . Hypertension   . Liver damage   . Stroke ([facility])    BP 116/70   Pulse 64   Temp 98.2 F (36.8 C)   Ht 5'  10" (1.778 m)   Wt 164 lb 12.8 oz (74.8 kg)   SpO2 95%   BMI 23.65 kg/m   Opioid Risk Score:   Fall Risk Score:  `1  Depression screen PHQ 2/9  Depression screen [facility] 2/9 01/01/2019 01/01/2018 11/20/2017 10/06/2017 05/19/2016 04/21/2016  Decreased Interest 0 0 0 0 0 0  Down, Depressed, Hopeless 0 0 0 0 0 0  PHQ - 2 Score 0 0 0 0 0 0     Review of Systems  All other systems reviewed and are negative.      Objective:   Physical Exam Vitals and nursing note reviewed.  Constitutional:      Appearance: Normal appearance.  HENT:     Head: Normocephalic and atraumatic.  Eyes:     Extraocular Movements: Extraocular movements intact.     Conjunctiva/sclera: Conjunctivae normal.     Pupils: Pupils are equal, round, and reactive to light.  Skin:    General: Skin is warm and dry.     Coloration: Skin is not jaundiced.  Neurological:     General: No focal deficit present.     Mental Status: He is alert and oriented to person, place, and time.     Comments: Tone MAS 3 at the left wrist flexors MAS 2 at the finger flexors MAS 2 at the left thumb flexor Motor strength is 0/5 in the finger and wrist extensors on the left side.  He has 2 - elbow flexors and extensors and 3 - deltoid. He has no pain with left upper extremity range of motion He ambulates with a tripod cane. His lower extremity tone MAS 2 at the hamstrings 0 at the quads he has clonus at the left ankle and hyperactive reflexes at the quadricep Speech without dysarthria or aphasia           Assessment & Plan:  #1.  Left spastic hemiparesis after CVA.  He has done relatively well with the change in botulinum toxin dose however his wrist flexor tone is quite tight today.  We will continue on the current dose 100 units.  If it becomes more difficult to range him to neutral may need to either increase his total dose or place more units into the wrist flexors. Discussed with patient agrees with plan. At this point he does not  wish to start any tizanidine. We also discussed his complaints of fatigue and we discussed that his Valium may be  causing this.  He is also on some Keppra We discussed that his Valium can be changed from 1 mg twice daily to 2 mg nightly this should also help with sleep and nocturnal spasms We will see the patient back in 6 weeks for same Botox treatment as in February

## 2019-10-16 ENCOUNTER — Telehealth: Payer: Self-pay | Admitting: Diagnostic Neuroimaging

## 2019-10-16 NOTE — Telephone Encounter (Signed)
[name],Jon(wife) has called to inform pt's Dr Kirsteins(rehab Dr.) has changed pt's diazepam (VALIUM) 2 MG tablet to once a day at night before bed.  This is a FYI no call back requested

## 2019-11-25 ENCOUNTER — Telehealth: Payer: Self-pay | Admitting: *Deleted

## 2019-11-25 NOTE — Telephone Encounter (Signed)
Patients wife left a message asking if it is safe to leave patient home alone for 2 days while she goes to a family reunion.  She asks for a discreet answer.  They are coming in for BOTOX tomorrow (11/26/2019). She asks that it not be discussed in front of patient.

## 2019-11-26 ENCOUNTER — Other Ambulatory Visit: Payer: Self-pay

## 2019-11-26 ENCOUNTER — Encounter: Payer: Medicare Other | Admitting: Physical Medicine & Rehabilitation

## 2019-11-26 NOTE — Telephone Encounter (Signed)
Mrs. Codrington called back and April relayed the message from Dr. Wynn Banker.

## 2019-11-26 NOTE — Telephone Encounter (Signed)
I think it should be ok

## 2019-12-03 ENCOUNTER — Other Ambulatory Visit: Payer: Self-pay

## 2019-12-03 ENCOUNTER — Encounter: Payer: Medicare Other | Attending: Physical Medicine & Rehabilitation | Admitting: Physical Medicine & Rehabilitation

## 2019-12-03 ENCOUNTER — Encounter: Payer: Self-pay | Admitting: Physical Medicine & Rehabilitation

## 2019-12-03 VITALS — BP 118/68 | HR 76 | Temp 97.7°F | Ht 70.0 in | Wt 162.8 lb

## 2019-12-03 DIAGNOSIS — I629 Nontraumatic intracranial hemorrhage, unspecified: Secondary | ICD-10-CM | POA: Insufficient documentation

## 2019-12-03 DIAGNOSIS — G8110 Spastic hemiplegia affecting unspecified side: Secondary | ICD-10-CM | POA: Diagnosis not present

## 2019-12-03 DIAGNOSIS — R252 Cramp and spasm: Secondary | ICD-10-CM | POA: Insufficient documentation

## 2019-12-03 DIAGNOSIS — I10 Essential (primary) hypertension: Secondary | ICD-10-CM | POA: Diagnosis not present

## 2019-12-03 DIAGNOSIS — Z5189 Encounter for other specified aftercare: Secondary | ICD-10-CM | POA: Diagnosis not present

## 2019-12-03 DIAGNOSIS — F1010 Alcohol abuse, uncomplicated: Secondary | ICD-10-CM | POA: Diagnosis not present

## 2019-12-03 DIAGNOSIS — G8194 Hemiplegia, unspecified affecting left nondominant side: Secondary | ICD-10-CM | POA: Insufficient documentation

## 2019-12-03 DIAGNOSIS — I639 Cerebral infarction, unspecified: Secondary | ICD-10-CM | POA: Diagnosis present

## 2019-12-03 NOTE — Progress Notes (Signed)
Botox Injection for spasticity using needle EMG guidance  Dilution: 50 Units/ml Indication: Severe spasticity which interferes with ADL,mobility and/or  hygiene and is unresponsive to medication management and other conservative care Informed consent was obtained after describing risks and benefits of the procedure with the patient. This includes bleeding, bruising, infection, excessive weakness, or medication side effects. A REMS form is on file and signed. Needle: 27g 1" needle electrode Number of units per muscle LEFT dominant  FDS25 FDP 25 FCR 25  FCU 25 All injections were done after obtaining appropriate EMG activity and after negative drawback for blood. The patient tolerated the procedure well. Post procedure instructions were given. A followup appointment was made.   Will see if additional muscles may need to be injected next round

## 2019-12-03 NOTE — Patient Instructions (Signed)

## 2019-12-26 IMAGING — MR MR HEAD W/O CM
9 of 12 series · 34 of 48 positions shown · non-contrast
Comparison: Head CT 05/03/2019 and MRI 05/25/2016

CLINICAL DATA: Encephalopathy. New onset seizure. History of
hemorrhagic stroke.

EXAM:
MRI HEAD WITHOUT CONTRAST
TECHNIQUE: Multiplanar, multiecho pulse sequences of the brain and surrounding
structures were obtained without intravenous contrast.

[Series 3: DWI · axial · 3.0mm · 1.09mm/px · z∈[-19,+138]mm · 9 of 108 slices shown (1 of 4)]
[im 1/108]
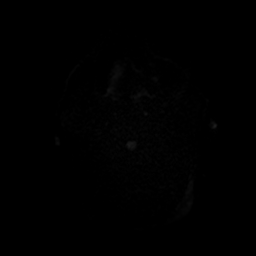
[im 14/108]
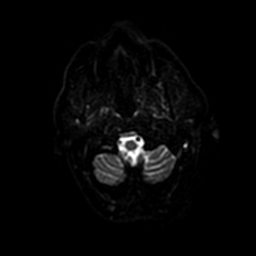
[im 27/108]
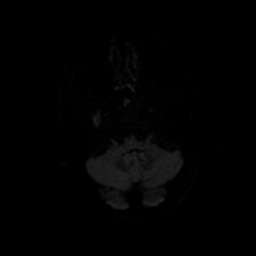
[im 41/108]
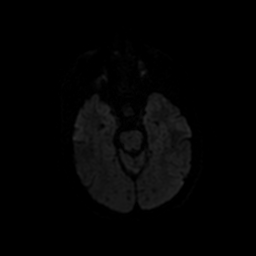
[im 54/108]
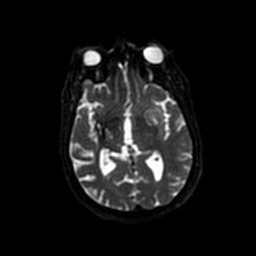
[im 67/108]
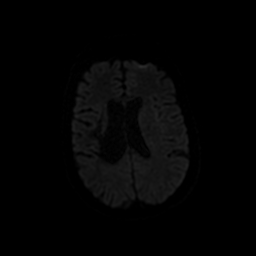
[im 81/108]
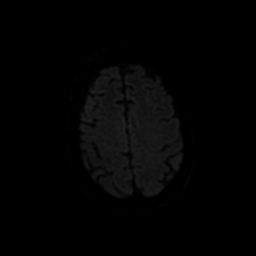
[im 94/108]
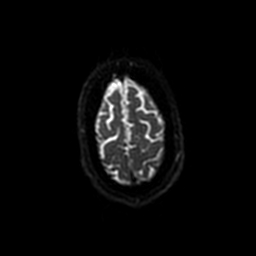
[im 108/108]
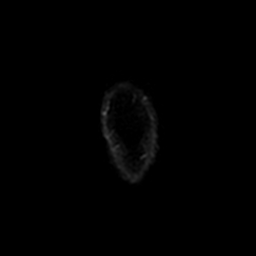

[Series 4: DWI · coronal · 5.0mm · 1.09mm/px · 6 of 80 slices shown (2 of 4)]
[im 1/80]
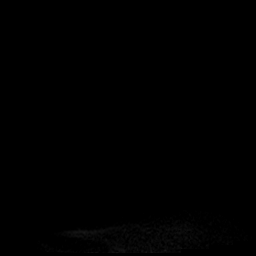
[im 16/80]
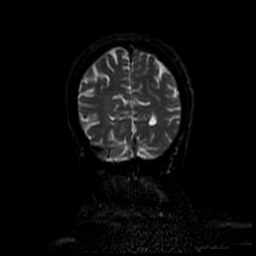
[im 32/80]
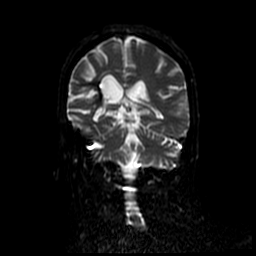
[im 48/80]
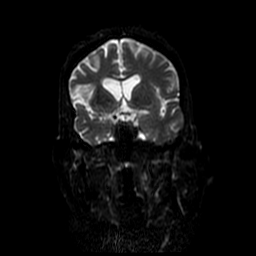
[im 64/80]
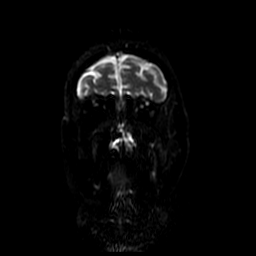
[im 80/80]
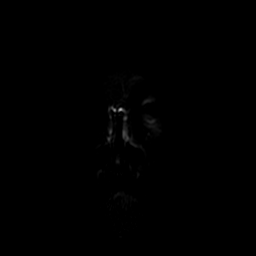

[Series 5: FLAIR · axial · 3.0mm · 0.43mm/px · z∈[-25,+129]mm · 2 of 27 slices shown (1 of 2)]
[im 1/27]
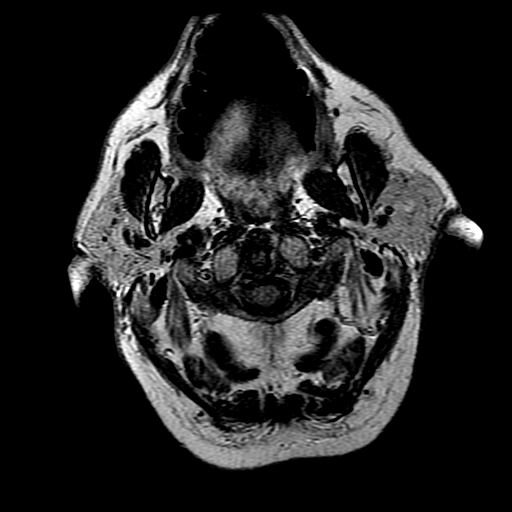
[im 27/27]
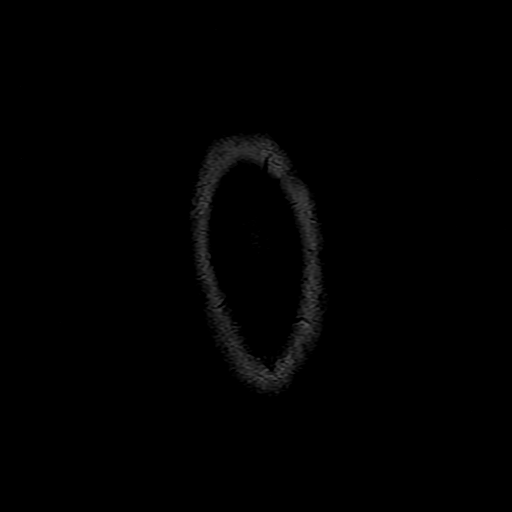

[Series 8: T2 · axial · 5.0mm · 0.43mm/px · z∈[-25,+129]mm · 2 of 27 slices shown (1 of 3)]
[im 1/27]
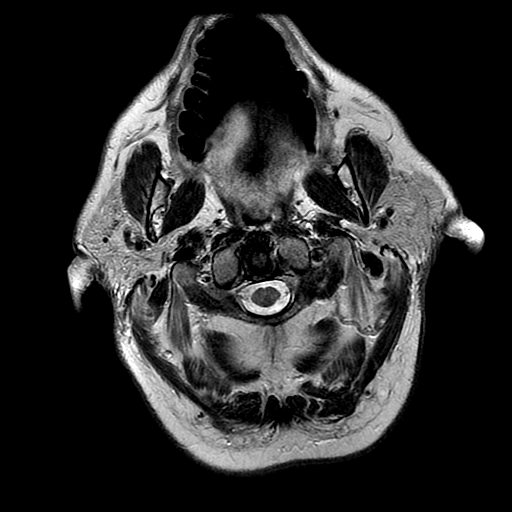
[im 27/27]
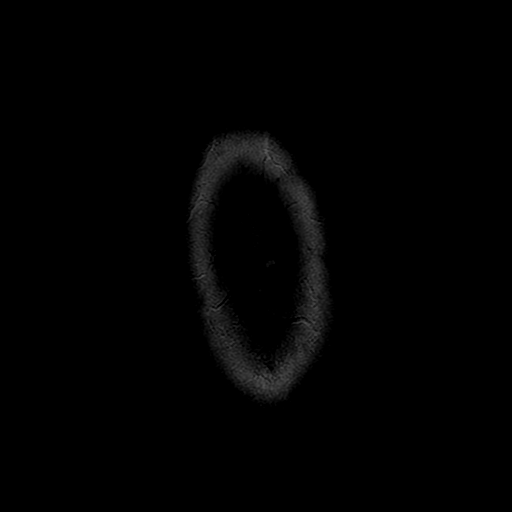

[Series 10: T2 · coronal · 5.0mm · 0.39mm/px · 3 of 31 slices shown (2 of 3)]
[im 1/31]
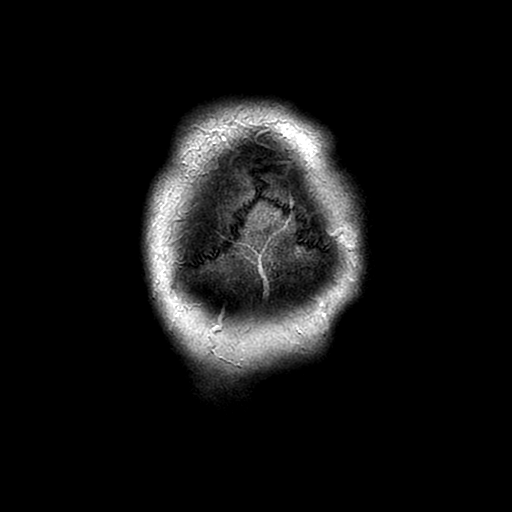
[im 16/31]
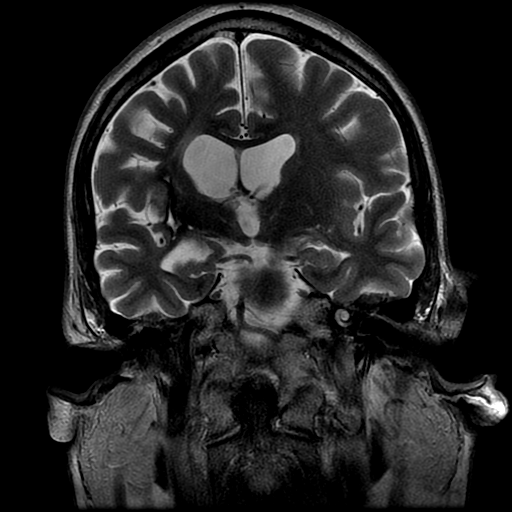
[im 31/31]
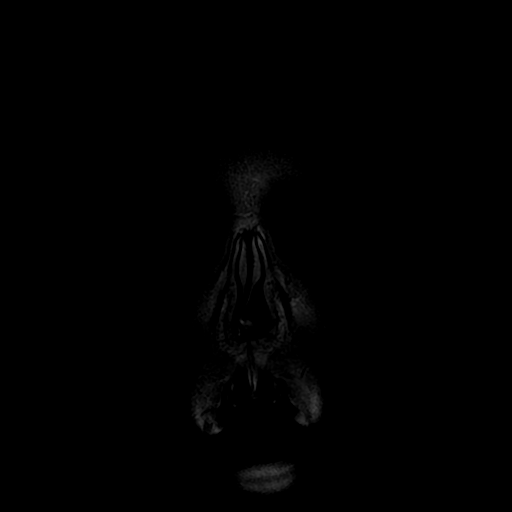

[Series 11: T2 · coronal · 3.0mm · 0.35mm/px · 2 of 33 slices shown (3 of 3)]
[im 1/33]
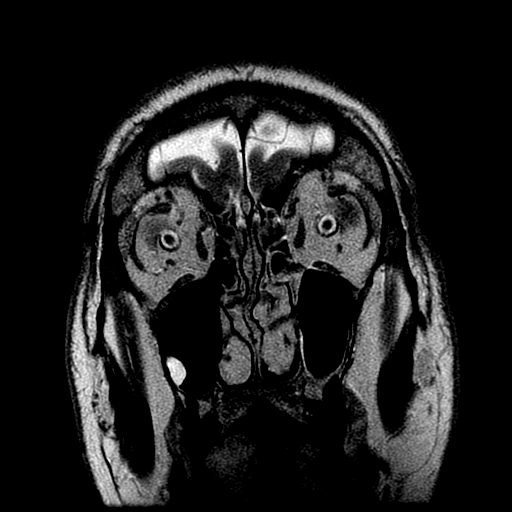
[im 17/33]
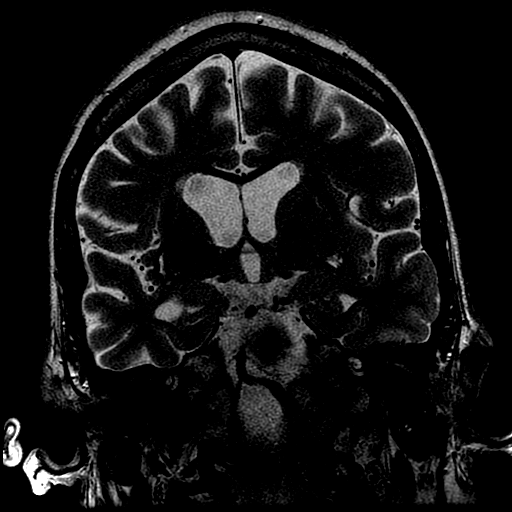

[Series 12: FLAIR · coronal · 3.0mm · 0.35mm/px · 3 of 39 slices shown (2 of 2)]
[im 1/39]
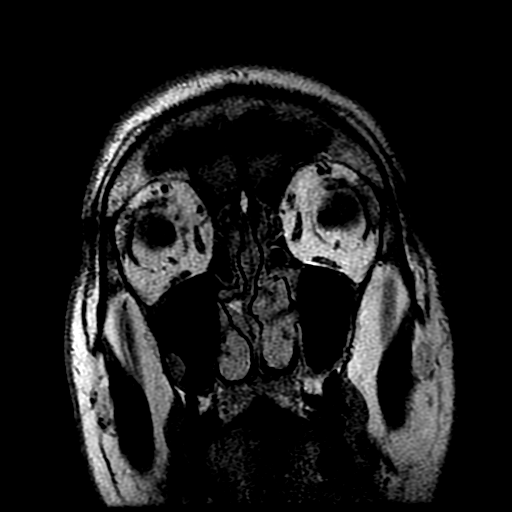
[im 20/39]
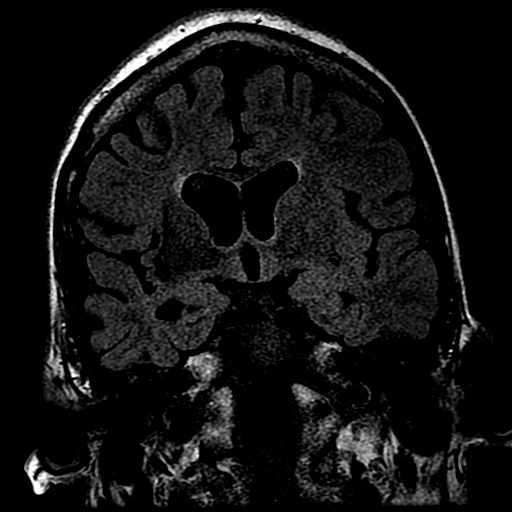
[im 39/39]
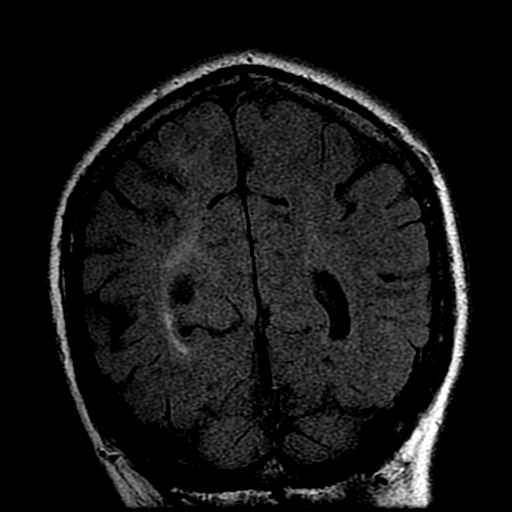

[Series 300: DWI · axial · 3.0mm · 1.09mm/px · z∈[-19,+138]mm · 4 of 54 slices shown (3 of 4)]
[im 1/54]
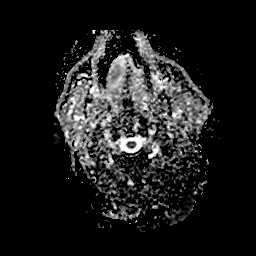
[im 18/54]
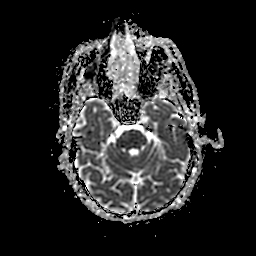
[im 36/54]
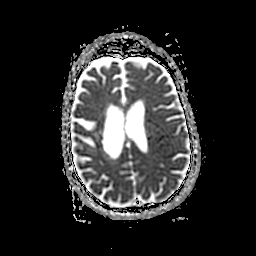
[im 54/54]
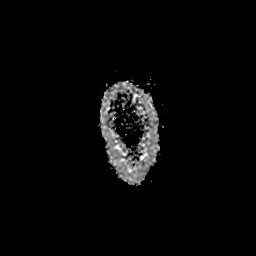

[Series 400: DWI · coronal · 5.0mm · 1.09mm/px · 3 of 40 slices shown (4 of 4)]
[im 1/40]
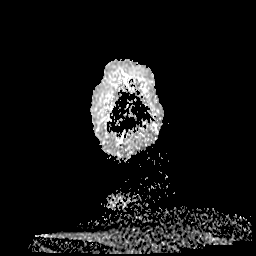
[im 20/40]
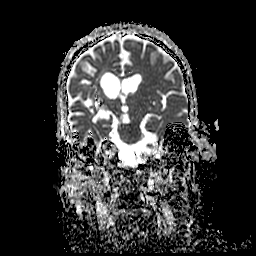
[im 40/40]
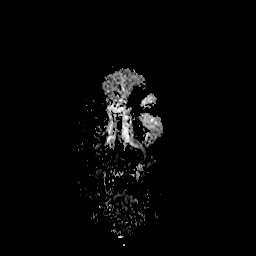

[34 of 48 positions shown; findings below may reference images not displayed]

FINDINGS: Brain: No acute infarct, mass, midline shift, or extra-axial fluid
collection is identified. Encephalomalacia and hemosiderin staining
are again noted in the right basal ganglia and adjacent white matter
related to a remote hemorrhage. There is associated ex vacuo
dilatation of the right lateral ventricle and wallerian degeneration
in the right midbrain. The left cerebral hemisphere is normal in
signal, as is the cerebellum. There is mild global cerebral atrophy.
The right hippocampus is mildly smaller than the left without signal
abnormality.

Vascular: Major intracranial vascular flow voids are preserved.

Skull and upper cervical spine: Unremarkable bone marrow signal.

Sinuses/Orbits: Unremarkable orbits. Small right maxillary sinus
mucous retention cysts. Small bilateral mastoid effusions.

Other: None.
IMPRESSION: 1. No acute intracranial abnormality.
2. Encephalomalacia related to a remote right basal ganglia
hemorrhage.

## 2020-01-09 ENCOUNTER — Other Ambulatory Visit: Payer: Self-pay

## 2020-01-09 ENCOUNTER — Encounter: Payer: Medicare Other | Attending: Physical Medicine & Rehabilitation | Admitting: Psychology

## 2020-01-09 ENCOUNTER — Encounter: Payer: Self-pay | Admitting: Psychology

## 2020-01-09 DIAGNOSIS — I69319 Unspecified symptoms and signs involving cognitive functions following cerebral infarction: Secondary | ICD-10-CM | POA: Diagnosis not present

## 2020-01-09 DIAGNOSIS — I610 Nontraumatic intracerebral hemorrhage in hemisphere, subcortical: Secondary | ICD-10-CM | POA: Diagnosis not present

## 2020-01-09 DIAGNOSIS — R252 Cramp and spasm: Secondary | ICD-10-CM | POA: Diagnosis not present

## 2020-01-09 DIAGNOSIS — F1010 Alcohol abuse, uncomplicated: Secondary | ICD-10-CM | POA: Insufficient documentation

## 2020-01-09 DIAGNOSIS — I69398 Other sequelae of cerebral infarction: Secondary | ICD-10-CM

## 2020-01-09 DIAGNOSIS — I69359 Hemiplegia and hemiparesis following cerebral infarction affecting unspecified side: Secondary | ICD-10-CM | POA: Diagnosis not present

## 2020-01-09 DIAGNOSIS — I10 Essential (primary) hypertension: Secondary | ICD-10-CM | POA: Diagnosis not present

## 2020-01-09 DIAGNOSIS — R269 Unspecified abnormalities of gait and mobility: Secondary | ICD-10-CM

## 2020-01-09 DIAGNOSIS — I629 Nontraumatic intracranial hemorrhage, unspecified: Secondary | ICD-10-CM | POA: Insufficient documentation

## 2020-01-09 DIAGNOSIS — G8194 Hemiplegia, unspecified affecting left nondominant side: Secondary | ICD-10-CM | POA: Insufficient documentation

## 2020-01-09 DIAGNOSIS — I639 Cerebral infarction, unspecified: Secondary | ICD-10-CM | POA: Diagnosis present

## 2020-01-09 DIAGNOSIS — Z5189 Encounter for other specified aftercare: Secondary | ICD-10-CM | POA: Insufficient documentation

## 2020-01-09 NOTE — Progress Notes (Signed)
Patient:  Jon [name]   DOB: Aug 12, 1955  MR Number: [id number]  Location: [facility] [place], [address] [id number] [place] [address] Dept: [phone]  Start: 4 PM End: 5 PM  Todays visit was an in person visit.  Provider/Observer:     Edgardo Roys PsyD  Chief Complaint:      Chief Complaint  Patient presents with  . Cerebrovascular Accident  . Other    hemiplegia    Reason For Service:     Jon [name] presents as a 64 year old history of hypertension alcohol use.  The patient presented on January 08, 2016 with acute onset of headache blood pressure was elevated.  CT of the head showed right basal ganglia hemorrhage with 31 mL volume and 3 mm midline shift.  The patient has not been drinking since his stroke.  He has been attending outpatient physical therapy.  Ongoing issues with spasticity of his left upper extremity greater than left lower extremity.  The patient has had minimal response to baclofen.  The patient was referred by Dr. Letta Pate for neuropsychological/psychological consultation.  The patient has continued to struggle with the loss of function following his stroke.  The patient reports that he thinks about his functional loss all of the time.  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  The patient has been continuing his art adjusting to his motor deficits the patient has been unable to play the guitar.  He can use his left arm or hand.  He has tried playing the keyboard with just his right hand but this is limited.  The patient reports that he is constantly thinking about playing songs in his head done this before.  However, he reports that now he cannot play them.  He reports that he is suggesting too much of the other issues but does have some cognitive issues related to attention and memory.  The  patient reports that he has completely quit drinking alcohol and quit smoking cigarettes and is lost 50 pounds.  He reports that his mood is okay except when he is around his wife and she starts singing again active and engaged.  The reason for service remains unchanged and the above information remains valid for this appointment.  It has been reviewed for this appointment.  Interventions Strategy:  Cognitive/behavioral therapeutic interventions as well as building coping skills and adaptive abilities following significant CVA.  Participation Level:   Active  Participation Quality:  Appropriate and Attentive      Behavioral Observation:  Well Groomed, Alert, and Appropriate.   Current Psychosocial Factors:  The patient reports increased stress with conflicts with his wife increasing.  She is getting very agitated at times and patient does not have the physical or coping abilitie to adjust to it.   Content of Session:   Reviewed current symptoms and continue to work on therapeutic interventions around issues of coping skills and adjustment skills.  Current Status:   The patient had a significant seizure in Oct.  In hospital for 3 days and unconscious.  Lead to depressive event with setback in motor deficits.  Reports SI during that time.  They have stopped and he is now getting back to post stroke baseline.  Patient has not had another seizure since but has more stress and coping issues.  He seems to have more motor  deficits than when I saw him last.  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  The patient reports that he is continued to have more interactions with others and use of coping skills continue to improve.   Last Reviewed:   01/09/2020  Impression/Diagnosis:   The patient continues to have a lot of  frustration with loss of function following his right basal ganglia hemorrhage and this is producing negative emotional response and frustrations.  The patient is having difficulties with motivation etc.  Diagnosis:   Basal ganglia hemorrhage (HCC)  Cognitive deficit, post-stroke  Gait disturbance, post-stroke  Hemiparesis affecting nondominant side as late effect of cerebrovascular accident (HCC)

## 2020-01-14 ENCOUNTER — Other Ambulatory Visit: Payer: Self-pay

## 2020-01-14 ENCOUNTER — Encounter: Payer: Medicare Other | Admitting: Physical Medicine & Rehabilitation

## 2020-01-14 ENCOUNTER — Encounter: Payer: Self-pay | Admitting: Physical Medicine & Rehabilitation

## 2020-01-14 VITALS — BP 111/66 | HR 74 | Temp 97.5°F | Ht 70.0 in | Wt 158.0 lb

## 2020-01-14 DIAGNOSIS — I10 Essential (primary) hypertension: Secondary | ICD-10-CM | POA: Diagnosis not present

## 2020-01-14 DIAGNOSIS — I69359 Hemiplegia and hemiparesis following cerebral infarction affecting unspecified side: Secondary | ICD-10-CM

## 2020-01-14 DIAGNOSIS — G8194 Hemiplegia, unspecified affecting left nondominant side: Secondary | ICD-10-CM | POA: Diagnosis not present

## 2020-01-14 DIAGNOSIS — F1010 Alcohol abuse, uncomplicated: Secondary | ICD-10-CM | POA: Diagnosis not present

## 2020-01-14 DIAGNOSIS — R252 Cramp and spasm: Secondary | ICD-10-CM | POA: Diagnosis not present

## 2020-01-14 DIAGNOSIS — Z5189 Encounter for other specified aftercare: Secondary | ICD-10-CM | POA: Diagnosis not present

## 2020-01-14 DIAGNOSIS — I629 Nontraumatic intracranial hemorrhage, unspecified: Secondary | ICD-10-CM | POA: Diagnosis not present

## 2020-01-14 NOTE — Progress Notes (Signed)
Subjective:    Patient ID: Jon [name], male    DOB: Dec 18, 1955, 64 y.o.   MRN: [id number]  HPI  Hx of hypertensive bleed Right thalamus with chronic left spastic hemiplegia.  The patient returns following the botulinum toxin injection for left upper extremity spasticity.  Overall he feels like the injection was helpful.  He likes to use some exercise equipment which requires grasping with his left hand.  Therefore, the goal is to reduce tone but not to the point where he cannot use his tone to facilitate grasp.  12/03/2019 Botox LEFT dominant   FDS25 FDP 25 FCR 25  FCU 25  [place]ell at [employer] got off balance on some steps no injury , fell against car.    Has an alert to call 9-1-1 in case of fall Pain Inventory Average Pain 0 Pain Right Now 0 My pain is no pain  In the last 24 hours, has pain interfered with the following? General activity 0 Relation with others 0 Enjoyment of life 0 What TIME of day is your pain at its worst? no pain Sleep (in general) Good  Pain is worse with: no pain Pain improves with: no pain Relief from Meds: no pain  Mobility walk with assistance use a cane ability to climb steps?  yes do you drive?  no Do you have any goals in this area?  yes  Function disabled: date disabled . retired I need assistance with the following:  meal prep, household duties and shopping  Neuro/Psych weakness numbness tingling trouble walking depression anxiety  Prior Studies Any changes since last visit?  no  Physicians involved in your care Any changes since last visit?  no   History reviewed. No pertinent family history. Social History   Socioeconomic History  . Marital status: Married    Spouse name: Not on file  . Number of children: Not on file  . Years of education: Not on file  . Highest education level: Not on file  Occupational History  . Not on file  Tobacco Use  . Smoking status: Former Smoker    Packs/day: 0.50    Years:  19.00    Pack years: 9.50    Types: Cigarettes    Quit date: 01/08/2016    Years since quitting: 4.0  . Smokeless tobacco: Never Used  Substance and Sexual Activity  . Alcohol use: Not Currently    Alcohol/week: 12.0 standard drinks    Types: 12 Glasses of wine per week    Comment: Wife reports chronic alcohol use. 01/12/16  . Drug use: No    Types: Marijuana    Comment: teens to early 20's  . Sexual activity: Not on file  Other Topics Concern  . Not on file  Social History Narrative   Lives home, retired.  Married.    Social Determinants of Health   Financial Resource Strain:   . Difficulty of Paying Living Expenses:   Food Insecurity:   . Worried About Charity fundraiser in the Last Year:   . Arboriculturist in the Last Year:   Transportation Needs:   . Film/video editor (Medical):   Marland Kitchen Lack of Transportation (Non-Medical):   Physical Activity:   . Days of Exercise per Week:   . Minutes of Exercise per Session:   Stress:   . Feeling of Stress :   Social Connections:   . Frequency of Communication with Friends and Family:   . Frequency of Social  Gatherings with Friends and Family:   . Attends Religious Services:   . Active Member of Clubs or Organizations:   . Attends Banker Meetings:   Marland Kitchen Marital Status:    Past Surgical History:  Procedure Laterality Date  . TONSILLECTOMY AND ADENOIDECTOMY Bilateral    Age 59   Past Medical History:  Diagnosis Date  . Bladder disorder 10/08/2001  . Depression   . Hypertension   . Liver damage   . Stroke (HCC)    BP 111/66   Pulse 74   Temp (!) 97.5 F (36.4 C)   Ht 5\' 10"  (1.778 m)   Wt 158 lb (71.7 kg)   SpO2 94%   BMI 22.67 kg/m   Opioid Risk Score:   Fall Risk Score:  `1  Depression screen PHQ 2/9  Depression screen [facility] 2/9 12/03/2019 01/01/2019 01/01/2018 11/20/2017 10/06/2017 05/19/2016 04/21/2016  Decreased Interest 0 0 0 0 0 0 0  Down, Depressed, Hopeless 0 0 0 0 0 0 0  PHQ - 2 Score 0 0 0 0  0 0 0  Some recent data might be hidden     Review of Systems  Constitutional: Negative.   HENT: Negative.   Eyes: Negative.   Respiratory: Negative.   Cardiovascular: Negative.   Gastrointestinal: Negative.   Endocrine: Negative.   Genitourinary: Negative.   Musculoskeletal: Positive for gait problem.  Skin: Negative.   Allergic/Immunologic: Negative.   Neurological: Positive for weakness and numbness.       Tingling  Hematological: Negative.   Psychiatric/Behavioral: Positive for dysphoric mood. The patient is nervous/anxious.   All other systems reviewed and are negative.      Objective:   Physical Exam Vitals and nursing note reviewed.  Constitutional:      Appearance: Normal appearance.  HENT:     Head: Normocephalic and atraumatic.  Eyes:     Extraocular Movements: Extraocular movements intact.     Conjunctiva/sclera: Conjunctivae normal.     Pupils: Pupils are equal, round, and reactive to light.  Neurological:     General: No focal deficit present.     Mental Status: He is alert and oriented to person, place, and time.     Comments: Motor strength is 3 - at the left deltoid and biceps 0 at the triceps 0 finger flexors and extensors Left lower extremity 4 - at the hip flexor 4 at the knee extensor and trace at the ankle dorsiflexor and plantar flexor. Tone MAS 1 at the elbow flexors MAS 2 at the finger flexors MAS 2/3 at the wrist flexors, MAS 1 at the ankle plantar flexors  Psychiatric:        Mood and Affect: Mood normal.        Behavior: Behavior normal.    Thumb flexor tone increased      Assessment & Plan:  #1.  History of hypertensive right thalamic bleed with resultant chronic left spastic hemiplegia.  As noted, the patient is still able to use some gross grasp in the left upper extremity but his hand is no longer followed into a fist. We discussed the normal duration of botulinum toxin and the need for reinjection.  We will see him back in 6 weeks  continue with 100 units no change in muscle group selection.

## 2020-01-14 NOTE — Patient Instructions (Signed)
Will continue current dose of Botox

## 2020-02-06 ENCOUNTER — Other Ambulatory Visit: Payer: Self-pay

## 2020-02-06 ENCOUNTER — Encounter: Payer: Medicare Other | Attending: Physical Medicine & Rehabilitation | Admitting: Psychology

## 2020-02-06 DIAGNOSIS — Z5189 Encounter for other specified aftercare: Secondary | ICD-10-CM | POA: Insufficient documentation

## 2020-02-06 DIAGNOSIS — I10 Essential (primary) hypertension: Secondary | ICD-10-CM | POA: Insufficient documentation

## 2020-02-06 DIAGNOSIS — I69359 Hemiplegia and hemiparesis following cerebral infarction affecting unspecified side: Secondary | ICD-10-CM | POA: Diagnosis not present

## 2020-02-06 DIAGNOSIS — F331 Major depressive disorder, recurrent, moderate: Secondary | ICD-10-CM | POA: Diagnosis not present

## 2020-02-06 DIAGNOSIS — I610 Nontraumatic intracerebral hemorrhage in hemisphere, subcortical: Secondary | ICD-10-CM | POA: Diagnosis not present

## 2020-02-06 DIAGNOSIS — R252 Cramp and spasm: Secondary | ICD-10-CM | POA: Insufficient documentation

## 2020-02-06 DIAGNOSIS — G8194 Hemiplegia, unspecified affecting left nondominant side: Secondary | ICD-10-CM | POA: Diagnosis not present

## 2020-02-06 DIAGNOSIS — I629 Nontraumatic intracranial hemorrhage, unspecified: Secondary | ICD-10-CM | POA: Diagnosis not present

## 2020-02-06 DIAGNOSIS — R269 Unspecified abnormalities of gait and mobility: Secondary | ICD-10-CM

## 2020-02-06 DIAGNOSIS — I639 Cerebral infarction, unspecified: Secondary | ICD-10-CM | POA: Diagnosis present

## 2020-02-06 DIAGNOSIS — I69398 Other sequelae of cerebral infarction: Secondary | ICD-10-CM

## 2020-02-06 DIAGNOSIS — F1010 Alcohol abuse, uncomplicated: Secondary | ICD-10-CM | POA: Insufficient documentation

## 2020-02-06 DIAGNOSIS — I69319 Unspecified symptoms and signs involving cognitive functions following cerebral infarction: Secondary | ICD-10-CM

## 2020-03-04 ENCOUNTER — Encounter: Payer: Self-pay | Admitting: Family Medicine

## 2020-03-04 ENCOUNTER — Encounter: Payer: Self-pay | Admitting: Psychology

## 2020-03-04 ENCOUNTER — Ambulatory Visit: Payer: Medicare Other | Admitting: Family Medicine

## 2020-03-04 ENCOUNTER — Other Ambulatory Visit: Payer: Self-pay

## 2020-03-04 VITALS — BP 103/58 | HR 67

## 2020-03-04 DIAGNOSIS — R569 Unspecified convulsions: Secondary | ICD-10-CM | POA: Diagnosis not present

## 2020-03-04 DIAGNOSIS — I69359 Hemiplegia and hemiparesis following cerebral infarction affecting unspecified side: Secondary | ICD-10-CM | POA: Diagnosis not present

## 2020-03-04 NOTE — Progress Notes (Signed)
PATIENT:  Jon [name] DOB: [birth year]  REASON FOR VISIT: follow up HISTORY FROM: patient  Chief Complaint  Patient presents with   Follow-up    8 month f/u for seizures. States he has been doing well since last visit   room 7     HISTORY OF PRESENT ILLNESS: Today 03/05/20 Jon [name] is a 64 y.o. male here today for follow up for seizure. He is s/p ICH in 2017 resulting in left hemiplegia. He continues levetiracetam 500mg  BID. He is tolerating medication well and denies seizure activity. He does have a history of depression but states that it has not worsened. He is followed by Dr , PCP and continues sertraline and quetiapine. He is also seen regularly by [facility]. He continues diazepam, prescribed by rehab, for spasticity. He is also followed by Dr Nicholos Johns, neuropsychology. He is able to walk at home but uses wheelchair for long distance traveling.    HISTORY: (copied from Dr Kieth Brightly note on 07/05/2019)   64 year old male with history of right basal ganglia intracerebral hemorrhage in 2017, here for evaluation of seizure.  Patient had new onset seizure on 05/03/2019, early in the morning when he was sleeping.  Patient started shaking and this woke up his wife.  She called EMS who arrived on scene.  Patient was confused and combative.  Patient went to the hospital for evaluation and treatment.  Patient was transferred to ICU due to agitation and need for IV sedation.  No electrographic seizures on EEG.  Spinal tap ruled out CNS infection.  Patient had somewhat prolonged postictal state but gradually returned to baseline.  He was discharged on levetiracetam 500 mg twice a day.  Since that time he is doing well.  No further seizures.  He is gradually returning to himself.  He was having some depression initially which is improving.   REVIEW OF SYSTEMS: Out of a complete 14 system review of symptoms, the patient complains only of the following symptoms, gait  impairment, left sided weakness, depression, anxiety, and all other reviewed systems are negative.  ALLERGIES: Allergies  Allergen Reactions   Gluten Meal Nausea And Vomiting   Lactose Intolerance (Gi) Nausea And Vomiting   Other     Tree nuts    Shellfish Allergy Swelling and Rash    HOME MEDICATIONS: Outpatient Medications Prior to Visit  Medication Sig Dispense Refill   aspirin EC 81 MG tablet Take 1 tablet (81 mg total) by mouth daily.     diazepam (VALIUM) 2 MG tablet Take 1 tablet (2 mg total) by mouth 2 (two) times daily. 30 tablet 0   fluticasone (FLONASE) 50 MCG/ACT nasal spray Place 1 spray into both nostrils daily. 1 g 2   folic acid (FOLVITE) 1 MG tablet Take 1 tablet (1 mg total) by mouth daily. (Patient taking differently: Take 1 mg by mouth 3 (three) times daily. ) 30 tablet 0   metoprolol tartrate (LOPRESSOR) 25 MG tablet Take 1 tablet (25 mg total) by mouth 2 (two) times daily. 60 tablet 1   Multiple Vitamin (MULTIVITAMIN WITH MINERALS) TABS tablet Take 1 tablet by mouth daily.     pravastatin (PRAVACHOL) 40 MG tablet Take 1 tablet (40 mg total) by mouth daily at 6 PM. 30 tablet 1   QUEtiapine (SEROQUEL) 25 MG tablet Take 0.5 tablets (12.5 mg total) by mouth at bedtime. 30 tablet 1   sertraline (ZOLOFT) 50 MG tablet Take 1 tablet (50 mg total) by mouth daily. (Patient  taking differently: Take 50 mg by mouth at bedtime. ) 30 tablet 1   Solifenacin Succinate (VESICARE PO) Take by mouth. Taking once daily Mg unknown     tadalafil (CIALIS) 5 MG tablet Take 5 mg by mouth daily.      levETIRAcetam (KEPPRA) 500 MG tablet Take 1 tablet (500 mg total) by mouth 2 (two) times daily. 180 tablet 4   Facility-Administered Medications Prior to Visit  Medication Dose Route Frequency Provider Last Rate Last Admin   gadopentetate dimeglumine (MAGNEVIST) injection 15 mL  15 mL Intravenous Once PRN Marvel Plan, MD        PAST MEDICAL HISTORY: Past Medical History:    Diagnosis Date   Bladder disorder 10/08/2001   Depression    Hypertension    Liver damage    Stroke [facility])     PAST SURGICAL HISTORY: Past Surgical History:  Procedure Laterality Date   TONSILLECTOMY AND ADENOIDECTOMY Bilateral    Age 89    FAMILY HISTORY: No family history on file.  SOCIAL HISTORY: Social History   Socioeconomic History   Marital status: Married    Spouse name: Not on file   Number of children: Not on file   Years of education: Not on file   Highest education level: Not on file  Occupational History   Not on file  Tobacco Use   Smoking status: Former Smoker    Packs/day: 0.50    Years: 19.00    Pack years: 9.50    Types: Cigarettes    Quit date: 01/08/2016    Years since quitting: 4.1   Smokeless tobacco: Never Used  Substance and Sexual Activity   Alcohol use: Not Currently    Alcohol/week: 12.0 standard drinks    Types: 12 Glasses of wine per week    Comment: Wife reports chronic alcohol use. 01/12/16   Drug use: No    Types: Marijuana    Comment: teens to early 20's   Sexual activity: Not on file  Other Topics Concern   Not on file  Social History Narrative   Lives home, retired.  Married.    Social Determinants of Health   Financial Resource Strain:    Difficulty of Paying Living Expenses:   Food Insecurity:    Worried About Programme researcher, broadcasting/film/video in the Last Year:    Barista in the Last Year:   Transportation Needs:    Freight forwarder (Medical):    Lack of Transportation (Non-Medical):   Physical Activity:    Days of Exercise per Week:    Minutes of Exercise per Session:   Stress:    Feeling of Stress :   Social Connections:    Frequency of Communication with Friends and Family:    Frequency of Social Gatherings with Friends and Family:    Attends Religious Services:    Active Member of Clubs or Organizations:    Attends Banker Meetings:    Marital Status:   Intimate  Partner Violence:    Fear of Current or Ex-Partner:    Emotionally Abused:    Physically Abused:    Sexually Abused:       PHYSICAL EXAM  Vitals:   03/04/20 1427  BP: (!) 103/58  Pulse: 67   There is no height or weight on file to calculate BMI.  Generalized: Well developed, in no acute distress  Cardiology: normal rate and rhythm, no murmur noted Respiratory: clear to auscultation bilaterally  Neurological examination  Mentation: Alert oriented to time, place, history taking. Follows all commands speech and language fluent Cranial nerve II-XII: Pupils were equal round reactive to light. Extraocular movements were full, visual field were full on confrontational test. Facial sensation and strength were normal. Uvula tongue midline. Head turning and shoulder shrug  were normal and symmetric. Motor: The motor testing reveals 5 over 5 strength of right upper and lower extremity, left upper 2/5, left lower 3/5.  Sensory: Sensory testing is intact to soft touch on all 4 extremities. No evidence of extinction is noted.  Coordination: Cerebellar testing reveals good finger-nose-finger and heel-to-shin on right, unable to perform on left Gait and station: patient refuses gait assessment today. In Wheelchair, has four prong cane.  Reflexes: Deep tendon reflexes are symmetric and normal bilaterally.   DIAGNOSTIC DATA (LABS, IMAGING, TESTING) - I reviewed patient records, labs, notes, testing and imaging myself where available.  No flowsheet data found.   Lab Results  Component Value Date   WBC 8.2 05/06/2019   HGB 13.3 05/06/2019   HCT 38.1 (L) 05/06/2019   MCV 90.3 05/06/2019   PLT 123 (L) 05/06/2019      Component Value Date/Time   NA 139 05/07/2019 0356   K 3.9 05/07/2019 0356   CL 104 05/07/2019 0356   CO2 21 (L) 05/07/2019 0356   GLUCOSE 64 (L) 05/07/2019 0356   BUN 5 (L) 05/07/2019 0356   CREATININE 0.66 05/07/2019 0356   CALCIUM 8.6 (L) 05/07/2019 0356   PROT 7.3  05/03/2019 0707   ALBUMIN 4.2 05/03/2019 0707   AST 22 05/03/2019 0707   ALT 14 05/03/2019 0707   ALKPHOS 53 05/03/2019 0707   BILITOT 1.1 05/03/2019 0707   GFRNONAA >60 05/07/2019 0356   GFRAA >60 05/07/2019 0356   Lab Results  Component Value Date   CHOL 250 (H) 01/09/2016   HDL 51 01/09/2016   LDLCALC 157 (H) 01/09/2016   TRIG 212 (H) 01/09/2016   CHOLHDL 4.9 01/09/2016   Lab Results  Component Value Date   HGBA1C 5.5 01/09/2016   Lab Results  Component Value Date   VITAMINB12 449 01/09/2016   Lab Results  Component Value Date   TSH 3.682 05/03/2019       ASSESSMENT AND PLAN 64 y.o. year old male  has a past medical history of Bladder disorder (10/08/2001), Depression, Hypertension, Liver damage, and Stroke (HCC). here with     ICD-10-CM   1. Generalized seizure (HCC)  R56.9   2. History of hemorrhagic stroke with residual hemiplegia (HCC)  I69.359     Yves is doing well, today. We will continue levetiracetam 500mg  twice daily. He was advised to monitor for signs of worsening depression. His wife is present with him today and agrees. He will continue close follow up with PCP for depression, insomnia and stroke prevention. He will continue follow up with Dr and Dr Wynn Banker as advised. Healthy lifestyle habits encouraged. He will follow up with Kieth Brightly in 1 year, sooner if needed.    No orders of the defined types were placed in this encounter.    Meds ordered this encounter  Medications   levETIRAcetam (KEPPRA) 500 MG tablet    Sig: Take 1 tablet (500 mg total) by mouth 2 (two) times daily.    Dispense:  180 tablet    Refill:  4    Order Specific Question:   Supervising Provider    Answer:   [place] Anson Fret  I spent 25 minutes with the patient. 50% of this time was spent counseling and educating patient on plan of care and medications.    Shawnie Dappermy Yoshimi Sarr, FNP-C 03/05/2020, 1:06 PM [facility] [address], [address] [place], [place] [address] [phone]

## 2020-03-04 NOTE — Patient Instructions (Addendum)
Continue levetiracetam 500mg  twice daily. Please monitor symptoms closely. We can consider changing this medication if you feel that depression gets worse again.   Continue close follow up with Dr . It sounds like sertraline may be helping. May consider increasing dose or switching this medication based on Dr Reede's opinion. Continue Seroquel as directed by Dr Aquilla Solian. This medicaiton can make you sleepy so be careful when you take it. Continue discussion with him. Continue stroke prevention plan. Talk to Dr Aquilla Solian about possible fungal infection of the left arm pit. May consider oral fungal treatment.   Continue follow up with Dr Aquilla Solian with rehab. They are managing your Valium. Discuss changing time of dosing of Valium if you feel this is making you tired.   Stay well hydrated. Healthy lifestyle habits encouraged.    Follow up in 1 year, sooner     Seizure, Adult A seizure is a sudden burst of abnormal electrical activity in the brain. Seizures usually last from 30 seconds to 2 minutes. They can cause many different symptoms. Usually, seizures are not harmful unless they last a long time. What are the causes? Common causes of this condition include:  Fever or infection.  Conditions that affect the brain, such as: ? A brain abnormality that you were born with. ? A brain or head injury. ? Bleeding in the brain. ? A tumor. ? Stroke. ? Brain disorders such as autism or cerebral palsy.  Low blood sugar.  Conditions that are passed from parent to child (are inherited).  Problems with substances, such as: ? Having a reaction to a drug or a medicine. ? Suddenly stopping the use of a substance (withdrawal). In some cases, the cause may not be known. A person who has repeated seizures over time without a clear cause has a condition called epilepsy. What increases the risk? You are more likely to get this condition if you have:  A family history of epilepsy.  Had a seizure in  the past.  A brain disorder.  A history of head injury, lack of oxygen at birth, or strokes. What are the signs or symptoms? There are many types of seizures. The symptoms vary depending on the type of seizure you have. Examples of symptoms during a seizure include:  Shaking (convulsions).  Stiffness in the body.  Passing out (losing consciousness).  Head nodding.  Staring.  Not responding to sound or touch.  Loss of bladder control and bowel control. Some people have symptoms right before and right after a seizure happens. Symptoms before a seizure may include:  Fear.  Worry (anxiety).  Feeling like you may vomit (nauseous).  Feeling like the room is spinning (vertigo).  Feeling like you saw or heard something before (dj vu).  Odd tastes or smells.  Changes in how you see. You may see flashing lights or spots. Symptoms after a seizure happens can include:  Confusion.  Sleepiness.  Headache.  Weakness on one side of the body. How is this treated? Most seizures will stop on their own in under 5 minutes. In these cases, no treatment is needed. Seizures that last longer than 5 minutes will usually need treatment. Treatment can include:  Medicines given through an IV tube.  Avoiding things that are known to cause your seizures. These can include medicines that you take for another condition.  Medicines to treat epilepsy.  Surgery to stop the seizures. This may be needed if medicines do not help. Follow these instructions at home: Medicines  Take over-the-counter and prescription medicines only as told by your doctor.  Do not eat or drink anything that may keep your medicine from working, such as alcohol. Activity  Do not do any activities that would be dangerous if you had another seizure, like driving or swimming. Wait until your doctor says it is safe for you to do them.  If you live in the [place] ask your local DMV (department of motor vehicles) when  you can drive.  Get plenty of rest. Teaching others Teach friends and family what to do when you have a seizure. They should:  Lay you on the ground.  Protect your head and body.  Loosen any tight clothing around your neck.  Turn you on your side.  Not hold you down.  Not put anything into your mouth.  Know whether or not you need emergency care.  Stay with you until you are better.  General instructions  Contact your doctor each time you have a seizure.  Avoid anything that gives you seizures.  Keep a seizure diary. Write down: ? What you think caused each seizure. ? What you remember about each seizure.  Keep all follow-up visits as told by your doctor. This is important. Contact a doctor if:  You have another seizure.  You have seizures more often.  There is any change in what happens during your seizures.  You keep having seizures with treatment.  You have symptoms of being sick or having an infection. Get help right away if:  You have a seizure that: ? Lasts longer than 5 minutes. ? Is different than seizures you had before. ? Makes it harder to breathe. ? Happens after you hurt your head.  You have any of these symptoms after a seizure: ? Not being able to speak. ? Not being able to use a part of your body. ? Confusion. ? A bad headache.  You have two or more seizures in a row.  You do not wake up right after a seizure.  You get hurt during a seizure. These symptoms may be an emergency. Do not wait to see if the symptoms will go away. Get medical help right away. Call your local emergency services (911 in the [place] Do not drive yourself to the hospital. Summary  Seizures usually last from 30 seconds to 2 minutes. Usually, they are not harmful unless they last a long time.  Do not eat or drink anything that may keep your medicine from working, such as alcohol.  Teach friends and family what to do when you have a seizure.  Contact your  doctor each time you have a seizure. This information is not intended to replace advice given to you by your health care provider. Make sure you discuss any questions you have with your health care provider. Document Revised: 09/28/2018 Document Reviewed: 09/28/2018 Elsevier Patient Education  2020 [employer].    Stroke Prevention Some medical conditions and lifestyle choices can lead to a higher risk for a stroke. You can help to prevent a stroke by making nutrition, lifestyle, and other changes. What nutrition changes can be made?   Eat healthy foods. ? Choose foods that are high in fiber. These include:  Fresh fruits.  Fresh vegetables.  Whole grains. ? Eat at least 5 or more servings of fruits and vegetables each day. Try to fill half of your plate at each meal with fruits and vegetables. ? Choose lean protein foods. These include:  Lowfat (lean) cuts of  meat.  Chicken without skin.  Fish.  Tofu.  Beans.  Nuts. ? Eat low-fat dairy products. ? Avoid foods that:  Are high in salt (sodium).  Have saturated fat.  Have trans fat.  Have cholesterol.  Are processed.  Are premade.  Follow eating guidelines as told by your doctor. These may include: ? Reducing how many calories you eat and drink each day. ? Limiting how much salt you eat or drink each day to 1,500 milligrams (mg). ? Using only healthy fats for cooking. These include:  Olive oil.  Canola oil.  Sunflower oil. ? Counting how many carbohydrates you eat and drink each day. What lifestyle changes can be made?  Try to stay at a healthy weight. Talk to your doctor about what a good weight is for you.  Get at least 30 minutes of moderate physical activity at least 5 days a week. This can include: ? Fast walking. ? Biking. ? Swimming.  Do not use any products that have nicotine or tobacco. This includes cigarettes and e-cigarettes. If you need help quitting, ask your doctor. Avoid being around  tobacco smoke in general.  Limit how much alcohol you drink to no more than 1 drink a day for nonpregnant women and 2 drinks a day for men. One drink equals 12 oz of beer, 5 oz of wine, or 1 oz of hard liquor.  Do not use drugs.  Avoid taking birth control pills. Talk to your doctor about the risks of taking birth control pills if: ? You are over 63 years old. ? You smoke. ? You get migraines. ? You have had a blood clot. What other changes can be made?  Manage your cholesterol. ? It is important to eat a healthy diet. ? If your cholesterol cannot be managed through your diet, you may also need to take medicines. Take medicines as told by your doctor.  Manage your diabetes. ? It is important to eat a healthy diet and to exercise regularly. ? If your blood sugar cannot be managed through diet and exercise, you may need to take medicines. Take medicines as told by your doctor.  Control your high blood pressure (hypertension). ? Try to keep your blood pressure below 130/80. This can help lower your risk of stroke. ? It is important to eat a healthy diet and to exercise regularly. ? If your blood pressure cannot be managed through diet and exercise, you may need to take medicines. Take medicines as told by your doctor. ? Ask your doctor if you should check your blood pressure at home. ? Have your blood pressure checked every year. Do this even if your blood pressure is normal.  Talk to your doctor about getting checked for a sleep disorder. Signs of this can include: ? Snoring a lot. ? Feeling very tired.  Take over-the-counter and prescription medicines only as told by your doctor. These may include aspirin or blood thinners (antiplatelets or anticoagulants).  Make sure that any other medical conditions you have are managed. Where to find more information  American Stroke Association: www.strokeassociation.org  National Stroke Association: www.stroke.org Get help right away  if:  You have any symptoms of stroke. "BE FAST" is an easy way to remember the main warning signs: ? B - Balance. Signs are dizziness, sudden trouble walking, or loss of balance. ? E - Eyes. Signs are trouble seeing or a sudden change in how you see. ? F - Face. Signs are sudden weakness or loss of  feeling of the face, or the face or eyelid drooping on one side. ? A - Arms. Signs are weakness or loss of feeling in an arm. This happens suddenly and usually on one side of the body. ? S - Speech. Signs are sudden trouble speaking, slurred speech, or trouble understanding what people say. ? T - Time. Time to call emergency services. Write down what time symptoms started.  You have other signs of stroke, such as: ? A sudden, very bad headache with no known cause. ? Feeling sick to your stomach (nausea). ? Throwing up (vomiting). ? Jerky movements you cannot control (seizure). These symptoms may represent a serious problem that is an emergency. Do not wait to see if the symptoms will go away. Get medical help right away. Call your local emergency services (911 in the [place] Do not drive yourself to the hospital. Summary  You can prevent a stroke by eating healthy, exercising, not smoking, drinking less alcohol, and treating other health problems, such as diabetes, high blood pressure, or high cholesterol.  Do not use any products that contain nicotine or tobacco, such as cigarettes and e-cigarettes.  Get help right away if you have any signs or symptoms of a stroke. This information is not intended to replace advice given to you by your health care provider. Make sure you discuss any questions you have with your health care provider. Document Revised: 09/06/2018 Document Reviewed: 10/12/2016 Elsevier Patient Education  2020 [employer].

## 2020-03-04 NOTE — Progress Notes (Signed)
Patient:  Jon [name]   DOB: 08/20/1955  MR Number: [id number]  Location: [facility]ILITATION [address] [place], [address] [id number] [facility] [place] [address] Dept: [phone]  Start: 4 PM End: 5 PM  Todays visit was an in person visit that was conducted in my outpatient clinic office with the patient, his wife and myself present.  Provider/Observer:     Hershal Coria PsyD  Chief Complaint:      Chief Complaint  Patient presents with  . Cerebrovascular Accident  . Other    Hemiplegia    Reason For Service:     Jon [name] presents as a 64 year old history of hypertension alcohol use.  The patient presented on January 08, 2016 with acute onset of headache blood pressure was elevated.  CT of the head showed right basal ganglia hemorrhage with 31 mL volume and 3 mm midline shift.  The patient has not been drinking since his stroke.  He has been attending outpatient physical therapy.  Ongoing issues with spasticity of his left upper extremity greater than left lower extremity.  The patient has had minimal response to baclofen.  The patient was referred by Dr. Wynn Banker for neuropsychological/psychological consultation.  The patient has continued to struggle with the loss of function following his stroke.  The patient reports that he thinks about his functional loss all of the time.  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  The patient has been continuing his art adjusting to his motor deficits the patient has been unable to play the guitar.  He can use his left arm or hand.  He has tried playing the keyboard with just his right hand but this is limited.  The patient reports that he is constantly thinking about playing songs in his head done this before.  However, he reports that now he cannot play them.  He reports that he is suggesting too much  of the other issues but does have some cognitive issues related to attention and memory.  The patient reports that he has completely quit drinking alcohol and quit smoking cigarettes and is lost 50 pounds.  He reports that his mood is okay except when he is around his wife and she starts singing again active and engaged.  The reason for service remains unchanged and the above information remains valid for this appointment.  It has been reviewed for this appointment.  Interventions Strategy:  Cognitive/behavioral therapeutic interventions as well as building coping skills and adaptive abilities following significant CVA.  Participation Level:   Active  Participation Quality:  Appropriate and Attentive      Behavioral Observation:  Well Groomed, Alert, and Appropriate.   Current Psychosocial Factors: The patient comes in along with his wife and we reviewed issues related to some of the stressors between the 2.  The patient's wife has a lot of concerns about the sudden variability in the patient's mood state that they have made changes around the house and built a building in the backyard to get him a place to go to work on his music can have time alone.  While there continue to be a lot of stressors between the 2 of them these have been improving overall.  Content of Session:   Reviewed current symptoms and continue to work on therapeutic interventions around issues of coping skills and adjustment skills.  Current  Status:   The patient has had no significant seizures since our last visit.  His depression has improved very recently but there continues to be significant issues around depression and frustration with the patient.  There have been conflicts between the patient and his wife and she has been very concerned about his episodes where he will get very negative quite suddenly and the patient's wife does not know how to respond to this effectively.  The patient has had times where he is voiced  passive suicidal ideation with no intent or plan and today he denies any active suicidal ideation or intent to harm.  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  The patient reports that he is continued to have more interactions with others and use of coping skills continue to improve.   Last Reviewed:   02/06/2020  Impression/Diagnosis:   The patient continues to have a lot of frustration with loss of function following his right basal ganglia hemorrhage and this is producing negative emotional response and frustrations.  The patient is having difficulties with motivation etc.  Diagnosis:   Hemiparesis affecting nondominant side as late effect of cerebrovascular accident (HCC)  Basal ganglia hemorrhage (HCC)  Cognitive deficit, post-stroke  Gait disturbance, post-stroke  Moderate episode of recurrent major depressive disorder (HCC)

## 2020-03-05 ENCOUNTER — Encounter: Payer: Medicare Other | Attending: Physical Medicine & Rehabilitation | Admitting: Psychology

## 2020-03-05 ENCOUNTER — Encounter: Payer: Self-pay | Admitting: Family Medicine

## 2020-03-05 ENCOUNTER — Other Ambulatory Visit: Payer: Self-pay

## 2020-03-05 ENCOUNTER — Ambulatory Visit: Payer: Medicare Other | Admitting: Physical Medicine & Rehabilitation

## 2020-03-05 DIAGNOSIS — F331 Major depressive disorder, recurrent, moderate: Secondary | ICD-10-CM

## 2020-03-05 DIAGNOSIS — R252 Cramp and spasm: Secondary | ICD-10-CM | POA: Diagnosis not present

## 2020-03-05 DIAGNOSIS — Z5189 Encounter for other specified aftercare: Secondary | ICD-10-CM | POA: Insufficient documentation

## 2020-03-05 DIAGNOSIS — I69359 Hemiplegia and hemiparesis following cerebral infarction affecting unspecified side: Secondary | ICD-10-CM | POA: Diagnosis not present

## 2020-03-05 DIAGNOSIS — I69398 Other sequelae of cerebral infarction: Secondary | ICD-10-CM | POA: Diagnosis not present

## 2020-03-05 DIAGNOSIS — I610 Nontraumatic intracerebral hemorrhage in hemisphere, subcortical: Secondary | ICD-10-CM

## 2020-03-05 DIAGNOSIS — I629 Nontraumatic intracranial hemorrhage, unspecified: Secondary | ICD-10-CM | POA: Insufficient documentation

## 2020-03-05 DIAGNOSIS — G8194 Hemiplegia, unspecified affecting left nondominant side: Secondary | ICD-10-CM | POA: Insufficient documentation

## 2020-03-05 DIAGNOSIS — I10 Essential (primary) hypertension: Secondary | ICD-10-CM | POA: Insufficient documentation

## 2020-03-05 DIAGNOSIS — I639 Cerebral infarction, unspecified: Secondary | ICD-10-CM | POA: Diagnosis present

## 2020-03-05 DIAGNOSIS — I69319 Unspecified symptoms and signs involving cognitive functions following cerebral infarction: Secondary | ICD-10-CM | POA: Diagnosis not present

## 2020-03-05 DIAGNOSIS — F1010 Alcohol abuse, uncomplicated: Secondary | ICD-10-CM | POA: Diagnosis not present

## 2020-03-05 DIAGNOSIS — R269 Unspecified abnormalities of gait and mobility: Secondary | ICD-10-CM

## 2020-03-05 MED ORDER — LEVETIRACETAM 500 MG PO TABS: 500.0000 mg | ORAL_TABLET | Freq: Two times a day (BID) | ORAL | 4 refills | Status: DC

## 2020-03-06 ENCOUNTER — Encounter: Payer: Medicare Other | Admitting: Physical Medicine & Rehabilitation

## 2020-03-09 ENCOUNTER — Encounter: Payer: Medicare Other | Admitting: Psychology

## 2020-03-12 DIAGNOSIS — I69359 Hemiplegia and hemiparesis following cerebral infarction affecting unspecified side: Secondary | ICD-10-CM | POA: Diagnosis not present

## 2020-03-12 DIAGNOSIS — Z0000 Encounter for general adult medical examination without abnormal findings: Secondary | ICD-10-CM | POA: Diagnosis not present

## 2020-03-12 DIAGNOSIS — I10 Essential (primary) hypertension: Secondary | ICD-10-CM | POA: Diagnosis not present

## 2020-03-12 DIAGNOSIS — E782 Mixed hyperlipidemia: Secondary | ICD-10-CM | POA: Diagnosis not present

## 2020-03-16 ENCOUNTER — Encounter: Payer: Self-pay | Admitting: Psychology

## 2020-03-16 NOTE — Progress Notes (Signed)
Patient:  Jon [name]   DOB: 01/17/1956  MR Number: [id number]  Location: [facility] FOR PAIN AND REHABILITATIVE MEDICINE [place] PHYSICAL MEDICINE AND REHABILITATION [address] [place], [address] [id number] [facility] [place] [address] Dept: [phone]  Start: 4 PM End: 5 PM  Todays visit was an in person visit that was conducted in my outpatient clinic office with the patient, his wife and myself present.  Provider/Observer:     Hershal Coria PsyD  Chief Complaint:      Chief Complaint  Patient presents with  . Cerebrovascular Accident  . Other    Hemiplegia    Reason For Service:     Jon [name] presents as a 64 year old history of hypertension alcohol use.  The patient presented on January 08, 2016 with acute onset of headache blood pressure was elevated.  CT of the head showed right basal ganglia hemorrhage with 31 mL volume and 3 mm midline shift.  The patient has not been drinking since his stroke.  He has been attending outpatient physical therapy.  Ongoing issues with spasticity of his left upper extremity greater than left lower extremity.  The patient has had minimal response to baclofen.  The patient was referred by Dr. Wynn Banker for neuropsychological/psychological consultation.  The patient has continued to struggle with the loss of function following his stroke.  The patient reports that he thinks about his functional loss all of the time.  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  The patient has been continuing his art adjusting to his motor deficits the patient has been unable to play the guitar.  He can use his left arm or hand.  He has tried playing the keyboard with just his right hand but this is limited.  The patient reports that he is constantly thinking about playing songs in his head done this before.  However, he reports that now he cannot play them.  He reports that he is suggesting too much  of the other issues but does have some cognitive issues related to attention and memory.  The patient reports that he has completely quit drinking alcohol and quit smoking cigarettes and is lost 50 pounds.  He reports that his mood is okay except when he is around his wife and she starts singing again active and engaged.  The reason for service remains unchanged and the above information remains valid for this appointment.  It has been reviewed for this appointment.  Interventions Strategy:  Cognitive/behavioral therapeutic interventions as well as building coping skills and adaptive abilities following significant CVA.  Participation Level:   Active  Participation Quality:  Appropriate and Attentive      Behavioral Observation:  Well Groomed, Alert, and Appropriate.   Current Psychosocial Factors: The patient reports that he continues to do better with regard to his mood and functioning status.  The patient reports that the new out building he and his wife have gotten to allow him to have his musical instruments and have a time/space for himself to get away has been helpful.  The patient's wife reports that she still is concerned about his overall mood status reports that his level of depression has improved.  Content of Session:   Reviewed current symptoms and continue to work on therapeutic interventions around issues of coping skills and adjustment skills.  Current Status:   The patient reports that overall his depression has been better.  He  reports that he has not been having seizure-like events since I last saw him and his wife confirms his overall improvement in functioning.  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  The patient reports that he is continued to have more interactions with others and use of  coping skills continue to improve.   Last Reviewed:   03/05/2020  Impression/Diagnosis:   The patient continues to have a lot of frustration with loss of function following his right basal ganglia hemorrhage and this is producing negative emotional response and frustrations.  The patient is having difficulties with motivation etc.  Diagnosis:   Hemiparesis affecting nondominant side as late effect of cerebrovascular accident (HCC)  Basal ganglia hemorrhage (HCC)  Cognitive deficit, post-stroke  Gait disturbance, post-stroke  Moderate episode of recurrent major depressive disorder (HCC)

## 2020-03-18 NOTE — Progress Notes (Signed)
I reviewed note and agree with plan.   Neysha Criado R. Kayshawn Ozburn, MD 03/18/2020, 12:00 PM Certified in Neurology, Neurophysiology and Neuroimaging  [facility] [address], [address] [place], [place] [address] [phone]  

## 2020-03-19 ENCOUNTER — Telehealth: Payer: Self-pay | Admitting: Family Medicine

## 2020-03-19 ENCOUNTER — Other Ambulatory Visit: Payer: Self-pay

## 2020-03-19 ENCOUNTER — Encounter: Payer: Medicare Other | Admitting: Physical Medicine & Rehabilitation

## 2020-03-19 ENCOUNTER — Encounter: Payer: Self-pay | Admitting: Physical Medicine & Rehabilitation

## 2020-03-19 VITALS — BP 115/64 | HR 71 | Temp 98.0°F | Ht 70.0 in | Wt 156.8 lb

## 2020-03-19 DIAGNOSIS — F1010 Alcohol abuse, uncomplicated: Secondary | ICD-10-CM | POA: Diagnosis not present

## 2020-03-19 DIAGNOSIS — I629 Nontraumatic intracranial hemorrhage, unspecified: Secondary | ICD-10-CM | POA: Diagnosis not present

## 2020-03-19 DIAGNOSIS — G8110 Spastic hemiplegia affecting unspecified side: Secondary | ICD-10-CM

## 2020-03-19 DIAGNOSIS — R252 Cramp and spasm: Secondary | ICD-10-CM | POA: Diagnosis not present

## 2020-03-19 DIAGNOSIS — Z5189 Encounter for other specified aftercare: Secondary | ICD-10-CM | POA: Diagnosis not present

## 2020-03-19 DIAGNOSIS — G8194 Hemiplegia, unspecified affecting left nondominant side: Secondary | ICD-10-CM | POA: Diagnosis not present

## 2020-03-19 DIAGNOSIS — I10 Essential (primary) hypertension: Secondary | ICD-10-CM | POA: Diagnosis not present

## 2020-03-19 MED ORDER — DIAZEPAM 2 MG PO TABS: 2.0000 mg | ORAL_TABLET | Freq: Every day | ORAL | 1 refills | Status: DC

## 2020-03-19 NOTE — Telephone Encounter (Signed)
Please let them know that I would prefer to keep PCP managing mood and rehab managing any needs they see fit. We had discussed this at their last visit. We do not typically manage mood here and rehab started valium for spasticity and they continue to see him for this. We will continue to manage seizures and assist with any other concerns as needed based on PCP referral.

## 2020-03-19 NOTE — Telephone Encounter (Signed)
[name],Jon [name](wife on DPR) is asking if Amy,NP or Dr Marjory Lies taking over the refills on pt's :diazepam (VALIUM) 2 MG tablet, quentiapine &  sertraline (ZOLOFT) 50 MG tablet please call.

## 2020-03-19 NOTE — Telephone Encounter (Signed)
Called wife and discussed NP's note with wife in detail. Patient has appointment with Dr Wynn Banker today, so she will discuss him continuing to refill Valium. She verbalized understanding of PCP refilling zoloft and this office refilling levetiracetam. She verbalized understanding, appreciation.

## 2020-03-19 NOTE — Patient Instructions (Signed)
FDS25 FDP25 FCR25 FCU 25

## 2020-04-02 DIAGNOSIS — Z0120 Encounter for dental examination and cleaning without abnormal findings: Secondary | ICD-10-CM | POA: Diagnosis not present

## 2020-04-09 ENCOUNTER — Encounter: Payer: Medicare Other | Admitting: Psychology

## 2020-04-30 ENCOUNTER — Ambulatory Visit: Payer: Medicare Other | Admitting: Physical Medicine & Rehabilitation

## 2020-05-07 ENCOUNTER — Encounter: Payer: Medicare Other | Attending: Psychology | Admitting: Physical Medicine & Rehabilitation

## 2020-05-07 ENCOUNTER — Other Ambulatory Visit: Payer: Self-pay

## 2020-05-07 ENCOUNTER — Encounter: Payer: Self-pay | Admitting: Physical Medicine & Rehabilitation

## 2020-05-07 VITALS — BP 118/65 | HR 75 | Temp 97.9°F | Ht 70.0 in | Wt 154.0 lb

## 2020-05-07 DIAGNOSIS — G8110 Spastic hemiplegia affecting unspecified side: Secondary | ICD-10-CM | POA: Diagnosis not present

## 2020-05-07 NOTE — Patient Instructions (Addendum)
Try Voltaren gel for hands  Call if Botox injection is needed prior to next visit

## 2020-05-07 NOTE — Progress Notes (Signed)
Subjective:    Patient ID: Jon [name], male    DOB: 03-10-1956, 64 y.o.   MRN: [id number]  HPI  64 year old male with right thalamic hemorrhage with resultant left spastic hemiplegia returns 6 weeks after botulinum toxin injection to the left finger flexors and wrist flexors The patient received 25 units to the left flexor carpi ulnaris, 25 units the left flexor carpi radialis, 25 units of the left flexor digitorum sublimis, 25 units of the left flexor digitorum profundus.  The patient still has some finger flexion and wrist flexion spasticity however the patient wishes to have this to allow grasping objects.  He also has noted some increased tone in his thumb flexor but states that this may be helpful for him in grasping his toothbrush. Wondering whether he would need to continue Botox indefinitely.  Discussed potential risks of stopping the treatment including contracture of the finger and wrist flexors on the left side.  He currently has no problems with hand hygiene or cutting his nails. Pain Inventory Average Pain 0 Pain Right Now 0 My pain is no pain  In the last 24 hours, has pain interfered with the following? General activity 0 Relation with others 0 Enjoyment of life 0 What TIME of day is your pain at its worst? na Sleep (in general) Good  Pain is worse with: no pain Pain improves with: no pain Relief from Meds: no pain  No family history on file. Social History   Socioeconomic History  . Marital status: Married    Spouse name: Not on file  . Number of children: Not on file  . Years of education: Not on file  . Highest education level: Not on file  Occupational History  . Not on file  Tobacco Use  . Smoking status: Former Smoker    Packs/day: 0.50    Years: 19.00    Pack years: 9.50    Types: Cigarettes    Quit date: 01/08/2016    Years since quitting: 4.3  . Smokeless tobacco: Never Used  Vaping Use  . Vaping Use: Never used  Substance and Sexual Activity   . Alcohol use: Not Currently    Alcohol/week: 12.0 standard drinks    Types: 12 Glasses of wine per week    Comment: Wife reports chronic alcohol use. 01/12/16  . Drug use: No    Types: Marijuana    Comment: teens to early 20's  . Sexual activity: Not on file  Other Topics Concern  . Not on file  Social History Narrative   Lives home, retired.  Married.    Social Determinants of Health   Financial Resource Strain:   . Difficulty of Paying Living Expenses: Not on file  Food Insecurity:   . Worried About Programme researcher, broadcasting/film/video in the Last Year: Not on file  . Ran Out of Food in the Last Year: Not on file  Transportation Needs:   . Lack of Transportation (Medical): Not on file  . Lack of Transportation (Non-Medical): Not on file  Physical Activity:   . Days of Exercise per Week: Not on file  . Minutes of Exercise per Session: Not on file  Stress:   . Feeling of Stress : Not on file  Social Connections:   . Frequency of Communication with Friends and Family: Not on file  . Frequency of Social Gatherings with Friends and Family: Not on file  . Attends Religious Services: Not on file  . Active Member of Clubs or  Organizations: Not on file  . Attends Banker Meetings: Not on file  . Marital Status: Not on file   Past Surgical History:  Procedure Laterality Date  . TONSILLECTOMY AND ADENOIDECTOMY Bilateral    Age 89   Past Surgical History:  Procedure Laterality Date  . TONSILLECTOMY AND ADENOIDECTOMY Bilateral    Age 89   Past Medical History:  Diagnosis Date  . Bladder disorder 10/08/2001  . Depression   . Hypertension   . Liver damage   . Stroke (HCC)    BP 118/65   Pulse 75   Temp 97.9 F (36.6 C)   Ht 5\' 10"  (1.778 m)   Wt 154 lb (69.9 kg)   SpO2 95%   BMI 22.10 kg/m   Opioid Risk Score:   Fall Risk Score:  `1  Depression screen PHQ 2/9  Depression screen [facility] 2/9 03/19/2020 12/03/2019 01/01/2019 01/01/2018 11/20/2017 10/06/2017 05/19/2016   Decreased Interest 0 0 0 0 0 0 0  Down, Depressed, Hopeless 0 0 0 0 0 0 0  PHQ - 2 Score 0 0 0 0 0 0 0  Some recent data might be hidden     Review of Systems     Objective:   Physical Exam Vitals and nursing note reviewed.  Constitutional:      Appearance: Normal appearance.  Eyes:     Extraocular Movements: Extraocular movements intact.     Conjunctiva/sclera: Conjunctivae normal.     Pupils: Pupils are equal, round, and reactive to light.  Musculoskeletal:     Comments: Please see media photo for image of left hand and wrist today. Patient has no pain with hand and wrist range of motion.  No evidence of joint swelling or erythema  Skin:    General: Skin is warm and dry.  Neurological:     Mental Status: He is alert and oriented to person, place, and time.     Comments: Motor strength is 3 - left deltoid bicep and tricep 0 at the finger and wrist flexors MAS 3 at the finger flexors on the left MAS 3 on the wrist flexors on the left MAS 3 on the thumb flexor on the left.  Psychiatric:        Mood and Affect: Mood normal.        Behavior: Behavior normal.           Assessment & Plan:  #1.  History of right thalamic hemorrhage with resultant chronic left spastic hemiplegia.  The patient still has significant spasticity.  He however likes to use the spasticity progressed.  Will have patient return in 3 months.  By that time the Botox will have worn off for 6 to 8 weeks and the patient will be able to assess the full impact of his spasticity.  We discussed that in the interval time if he has problems with hand hygiene or fingernail clipping to call for repeat injection.  Patient and his wife understand and agree with plan.

## 2020-05-11 ENCOUNTER — Encounter: Payer: Medicare Other | Admitting: Psychology

## 2020-05-25 DIAGNOSIS — R350 Frequency of micturition: Secondary | ICD-10-CM | POA: Diagnosis not present

## 2020-05-25 DIAGNOSIS — N401 Enlarged prostate with lower urinary tract symptoms: Secondary | ICD-10-CM | POA: Diagnosis not present

## 2020-05-25 DIAGNOSIS — R3912 Poor urinary stream: Secondary | ICD-10-CM | POA: Diagnosis not present

## 2020-06-11 ENCOUNTER — Encounter: Payer: Medicare Other | Admitting: Psychology

## 2020-06-17 DIAGNOSIS — H35033 Hypertensive retinopathy, bilateral: Secondary | ICD-10-CM | POA: Diagnosis not present

## 2020-06-17 DIAGNOSIS — H02831 Dermatochalasis of right upper eyelid: Secondary | ICD-10-CM | POA: Diagnosis not present

## 2020-06-17 DIAGNOSIS — H25813 Combined forms of age-related cataract, bilateral: Secondary | ICD-10-CM | POA: Diagnosis not present

## 2020-06-17 DIAGNOSIS — G43809 Other migraine, not intractable, without status migrainosus: Secondary | ICD-10-CM | POA: Diagnosis not present

## 2020-06-29 DIAGNOSIS — Z23 Encounter for immunization: Secondary | ICD-10-CM | POA: Diagnosis not present

## 2020-07-13 ENCOUNTER — Encounter: Payer: Medicare Other | Admitting: Psychology

## 2020-07-21 ENCOUNTER — Other Ambulatory Visit: Payer: Self-pay

## 2020-07-21 ENCOUNTER — Encounter: Payer: Self-pay | Admitting: Physical Medicine & Rehabilitation

## 2020-07-21 ENCOUNTER — Encounter: Payer: Medicare Other | Attending: Physical Medicine & Rehabilitation | Admitting: Physical Medicine & Rehabilitation

## 2020-07-21 VITALS — BP 109/67 | HR 66 | Temp 98.0°F | Ht 70.0 in | Wt 159.0 lb

## 2020-07-21 DIAGNOSIS — G8110 Spastic hemiplegia affecting unspecified side: Secondary | ICD-10-CM | POA: Diagnosis not present

## 2020-07-21 NOTE — Patient Instructions (Addendum)
TENS unit to Left deltoid  Please call if spasticity increases in the Left arm or leg

## 2020-07-21 NOTE — Progress Notes (Signed)
64 year old male with remote right thalamic hemorrhage who has had persistent left spastic hemiplegia and left hemisensory deficits returns today for assessment of his spasticity.  His last botulinum toxin injection was greater than 4 months ago.  The patient does not feel like his spasticity has worsened over the last month or so. He does like to use spasticity as a means of gripping exercise equipment. He continues to ambulate with a cane he has chronic issues with left foot inversion but finds his ankle-foot orthosis uncomfortable and no longer wears it.  Examination MAS 2 left elbow flexors MAS 3 left wrist flexors MAS 3 left finger flexors MAS 3 left thumb flexor as well as opponens pollicis  Motor strength is to minus at the left deltoid 3 - at the bicep to minus at the triceps 0 at the finger and wrist flexors and extensors.  Ambulates with a cane, has evidence of foot inversion on the left with poor heel strike  Sensation absent to light touch in the left upper limb  Impression Left spastic hemiplegia secondary to remote right thalamic hemorrhage, we discussed the pros and cons of botulinum toxin injections at this point he would like to forego further injections unless he has difficulty with hand or fingernail hygiene or develops increased pain in the left upper limb.  We discussed left shoulder exercise to prevent frozen shoulder. Physical medicine rehab follow-up in 6 months, patient is to call if he would like reevaluation prior to that time

## 2020-08-07 ENCOUNTER — Ambulatory Visit: Payer: Medicare Other | Admitting: Physical Medicine & Rehabilitation

## 2020-09-12 ENCOUNTER — Other Ambulatory Visit: Payer: Self-pay | Admitting: Physical Medicine & Rehabilitation

## 2020-10-21 DIAGNOSIS — I69359 Hemiplegia and hemiparesis following cerebral infarction affecting unspecified side: Secondary | ICD-10-CM | POA: Diagnosis not present

## 2020-10-21 DIAGNOSIS — E782 Mixed hyperlipidemia: Secondary | ICD-10-CM | POA: Diagnosis not present

## 2020-10-21 DIAGNOSIS — I10 Essential (primary) hypertension: Secondary | ICD-10-CM | POA: Diagnosis not present

## 2020-10-21 DIAGNOSIS — N400 Enlarged prostate without lower urinary tract symptoms: Secondary | ICD-10-CM | POA: Diagnosis not present

## 2020-12-09 ENCOUNTER — Telehealth: Payer: Self-pay

## 2020-12-09 NOTE — Telephone Encounter (Signed)
Rosey Bath, wife of patient called stating patient is adamant about driving but he is doing his exercises nor is his left still not working properly. Please advise.

## 2020-12-11 NOTE — Telephone Encounter (Signed)
Left message with info.

## 2021-01-19 ENCOUNTER — Ambulatory Visit: Payer: Medicare Other | Admitting: Physical Medicine & Rehabilitation

## 2021-02-11 ENCOUNTER — Encounter: Payer: Medicare Other | Attending: Physical Medicine & Rehabilitation | Admitting: Physical Medicine & Rehabilitation

## 2021-02-11 ENCOUNTER — Encounter: Payer: Self-pay | Admitting: Physical Medicine & Rehabilitation

## 2021-02-11 ENCOUNTER — Other Ambulatory Visit: Payer: Self-pay

## 2021-02-11 VITALS — BP 118/66 | HR 53 | Temp 97.9°F

## 2021-02-11 DIAGNOSIS — I69398 Other sequelae of cerebral infarction: Secondary | ICD-10-CM | POA: Insufficient documentation

## 2021-02-11 DIAGNOSIS — I610 Nontraumatic intracerebral hemorrhage in hemisphere, subcortical: Secondary | ICD-10-CM | POA: Insufficient documentation

## 2021-02-11 DIAGNOSIS — I69319 Unspecified symptoms and signs involving cognitive functions following cerebral infarction: Secondary | ICD-10-CM | POA: Diagnosis not present

## 2021-02-11 DIAGNOSIS — R269 Unspecified abnormalities of gait and mobility: Secondary | ICD-10-CM | POA: Diagnosis not present

## 2021-02-11 DIAGNOSIS — G8110 Spastic hemiplegia affecting unspecified side: Secondary | ICD-10-CM | POA: Diagnosis not present

## 2021-02-11 NOTE — Patient Instructions (Signed)
Back Exercises These exercises help to make your trunk and back strong. They also help to keep the lower back flexible. Doing these exercises can help to prevent back pain or lessen existing pain. If you have back pain, try to do these exercises 2-3 times each day or as told by your doctor. As you get better, do the exercises once each day. Repeat the exercises more often as told by your doctor. To stop back pain from coming back, do the exercises once each day, or as told by your doctor. Exercises Single knee to chest Do these steps 3-5 times in a row for each leg: Lie on your back on a firm bed or the floor with your legs stretched out. Bring one knee to your chest. Grab your knee or thigh with both hands and hold them it in place. Pull on your knee until you feel a gentle stretch in your lower back or buttocks. Keep doing the stretch for 10-30 seconds. Slowly let go of your leg and straighten it. Pelvic tilt Do these steps 5-10 times in a row: Lie on your back on a firm bed or the floor with your legs stretched out. Bend your knees so they point up to the ceiling. Your feet should be flat on the floor. Tighten your lower belly (abdomen) muscles to press your lower back against the floor. This will make your tailbone point up to the ceiling instead of pointing down to your feet or the floor. Stay in this position for 5-10 seconds while you gently tighten your muscles and breathe evenly. Cat-cow Do these steps until your lower back bends more easily: Get on your hands and knees on a firm surface. Keep your hands under your shoulders, and keep your knees under your hips. You may put padding under your knees. Let your head hang down toward your chest. Tighten (contract) the muscles in your belly. Point your tailbone toward the floor so your lower back becomes rounded like the back of a cat. Stay in this position for 5 seconds. Slowly lift your head. Let the muscles of your belly relax. Point  your tailbone up toward the ceiling so your back forms a sagging arch like the back of a cow. Stay in this position for 5 seconds.  Press-ups Do these steps 5-10 times in a row: Lie on your belly (face-down) on the floor. Place your hands near your head, about shoulder-width apart. While you keep your back relaxed and keep your hips on the floor, slowly straighten your arms to raise the top half of your body and lift your shoulders. Do not use your back muscles. You may change where you place your hands in order to make yourself more comfortable. Stay in this position for 5 seconds. Slowly return to lying flat on the floor.  Bridges Do these steps 10 times in a row: Lie on your back on a firm surface. Bend your knees so they point up to the ceiling. Your feet should be flat on the floor. Your arms should be flat at your sides, next to your body. Tighten your butt muscles and lift your butt off the floor until your waist is almost as high as your knees. If you do not feel the muscles working in your butt and the back of your thighs, slide your feet 1-2 inches farther away from your butt. Stay in this position for 3-5 seconds. Slowly lower your butt to the floor, and let your butt muscles relax. If   this exercise is too easy, try doing it with your arms crossed over yourchest. Belly crunches Do these steps 5-10 times in a row: Lie on your back on a firm bed or the floor with your legs stretched out. Bend your knees so they point up to the ceiling. Your feet should be flat on the floor. Cross your arms over your chest. Tip your chin a little bit toward your chest but do not bend your neck. Tighten your belly muscles and slowly raise your chest just enough to lift your shoulder blades a tiny bit off of the floor. Avoid raising your body higher than that, because it can put too much stress on your low back. Slowly lower your chest and your head to the floor. Back lifts Do these steps 5-10 times  in a row: Lie on your belly (face-down) with your arms at your sides, and rest your forehead on the floor. Tighten the muscles in your legs and your butt. Slowly lift your chest off of the floor while you keep your hips on the floor. Keep the back of your head in line with the curve in your back. Look at the floor while you do this. Stay in this position for 3-5 seconds. Slowly lower your chest and your face to the floor. Contact a doctor if: Your back pain gets a lot worse when you do an exercise. Your back pain does not get better 2 hours after you exercise. If you have any of these problems, stop doing the exercises. Do not do them again unless your doctor says it is okay. Get help right away if: You have sudden, very bad back pain. If this happens, stop doing the exercises. Do not do them again unless your doctor says it is okay. This information is not intended to replace advice given to you by your health care provider. Make sure you discuss any questions you have with your healthcare provider. Document Revised: 04/05/2018 Document Reviewed: 04/05/2018 Elsevier Patient Education  2022 [employer]  

## 2021-02-11 NOTE — Progress Notes (Signed)
Subjective:    Patient ID: Jon [name], male    DOB: 27-Jan-1956, 65 y.o.   MRN: [id number] 65 year old male with remote right thalamic hemorrhage who has had persistent left spastic hemiplegia and left hemisensory deficits returns today for assessment of his spasticity.  His last botulinum toxin injection was greater than 4 months ago.  The patient does not feel like his spasticity has worsened over the last month or so. He does like to use spasticity as a means of gripping exercise equipment. He continues to ambulate with a cane he has chronic issues with left foot inversion but finds his ankle-foot orthosis uncomfortable and no longer wears it.   Examination MAS 2 left elbow flexors MAS 3 left wrist flexors MAS 3 left finger flexors MAS 3 left thumb flexor as well as opponens pollicis   Motor strength is to minus at the left deltoid 3 - at the bicep to minus at the triceps 0 at the finger and wrist flexors and extensors.   Ambulates with a cane, has evidence of foot inversion on the left with poor heel strike   Sensation absent to light touch in the left upper limb   HPI Low back pain x 3 day, tried tylenol, which did not help but took a "hit of weed" which helped  Patient did not have any falls or any trauma to the area.  He states that sometimes when he lays supine to sleep rather than on his side he developed some back pain. The patient has no history of spine surgery. Last physical medicine rehab visit 6 months ago.  It is the patient's birthday today. In regards to his tone he does not feel that hip has changed very much over the last several months.  It is up a little bit now that he has back pain. Pain Inventory Average Pain 6 Pain Right Now 6 My pain is constant, sharp, and aching  In the last 24 hours, has pain interfered with the following? General activity 6 Relation with others 6 Enjoyment of life 6 What TIME of day is your pain at its worst? morning  Sleep (in  general) Good  Pain is worse with: walking and standing Pain improves with: heat/ice, medication, and herbal Relief from Meds: 5  No family history on file. Social History   Socioeconomic History   Marital status: Married    Spouse name: Not on file   Number of children: Not on file   Years of education: Not on file   Highest education level: Not on file  Occupational History   Not on file  Tobacco Use   Smoking status: Former    Packs/day: 0.50    Years: 19.00    Pack years: 9.50    Types: Cigarettes    Quit date: 01/08/2016    Years since quitting: 5.0   Smokeless tobacco: Never  Vaping Use   Vaping Use: Never used  Substance and Sexual Activity   Alcohol use: Not Currently    Alcohol/week: 12.0 standard drinks    Types: 12 Glasses of wine per week    Comment: Wife reports chronic alcohol use. 01/12/16   Drug use: No    Types: Marijuana    Comment: teens to early 20's   Sexual activity: Not on file  Other Topics Concern   Not on file  Social History Narrative   Lives home, retired.  Married.    Social Determinants of Health   Financial Resource Strain: Not  on file  Food Insecurity: Not on file  Transportation Needs: Not on file  Physical Activity: Not on file  Stress: Not on file  Social Connections: Not on file   Past Surgical History:  Procedure Laterality Date   TONSILLECTOMY AND ADENOIDECTOMY Bilateral    Age 80   Past Surgical History:  Procedure Laterality Date   TONSILLECTOMY AND ADENOIDECTOMY Bilateral    Age 80   Past Medical History:  Diagnosis Date   Bladder disorder 10/08/2001   Depression    Hypertension    Liver damage    Stroke (HCC)    BP 118/66 (BP Location: Right Arm, Patient Position: Sitting, Cuff Size: Normal)   Pulse (!) 53   Temp 97.9 F (36.6 C) (Oral)   SpO2 95%   Opioid Risk Score:   Fall Risk Score:  `1  Depression screen PHQ 2/9  Depression screen [facility] 2/9 03/19/2020 12/03/2019 01/01/2019 01/01/2018 11/20/2017  10/06/2017 05/19/2016  Decreased Interest 0 0 0 0 0 0 0  Down, Depressed, Hopeless 0 0 0 0 0 0 0  PHQ - 2 Score 0 0 0 0 0 0 0  Some recent data might be hidden      Review of Systems  Musculoskeletal:  Positive for back pain.      Objective:   Physical Exam Vitals and nursing note reviewed.  Constitutional:      Appearance: He is normal weight.  HENT:     Head: Normocephalic and atraumatic.  Eyes:     Extraocular Movements: Extraocular movements intact.     Conjunctiva/sclera: Conjunctivae normal.     Pupils: Pupils are equal, round, and reactive to light.  Musculoskeletal:     Right lower leg: No edema.     Left lower leg: No edema.  Skin:    General: Skin is warm and dry.  Neurological:     Mental Status: He is alert and oriented to person, place, and time.  Psychiatric:        Mood and Affect: Mood normal.        Behavior: Behavior normal.   Motor strength is 3 - at the left deltoid and biceps and triceps trace finger flexors 0 finger extensors Tone MAS 3 at the finger flexors and thumb flexor MAS 3 at the wrist flexor MAS 1 at the elbow flexor       Assessment & Plan:  #1.  History of right subcortical hemorrhage, remote with chronic left spastic hemiplegia.  His tone has not changed appreciably even though he is no longer getting Botox injections there is some risk of having finger flexor and wrist flexor contractures and this was discussed with the patient he would like to continue to monitor for now. 2.  Back pain recent onset likely prolonged bed positioning, suspect facet arthropathy is underlying problem.  Have given patient some stretching exercises.  If not much better he may need some physical therapy I will see him back in 6 months or he can call sooner if he fails to improve with this. 3.  Cognitive deficits post stroke some perceptual as well he does not have any functioning of the left upper or left lower limb in regards to driving.  He would like to return  to driving.  We discussed that I would not recommend at this time without any formalized OT driving evaluation.

## 2021-02-18 ENCOUNTER — Telehealth: Payer: Self-pay

## 2021-02-18 NOTE — Telephone Encounter (Signed)
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 She states Dr. Kieth Brightly told him not to wait 4 months to call if he got this way. Sending Dr. Kieth Brightly a message.

## 2021-02-25 ENCOUNTER — Other Ambulatory Visit: Payer: Self-pay

## 2021-02-25 ENCOUNTER — Encounter: Payer: Medicare Other | Attending: Psychology | Admitting: Psychology

## 2021-02-25 DIAGNOSIS — G8110 Spastic hemiplegia affecting unspecified side: Secondary | ICD-10-CM

## 2021-02-25 DIAGNOSIS — I69398 Other sequelae of cerebral infarction: Secondary | ICD-10-CM | POA: Diagnosis not present

## 2021-02-25 DIAGNOSIS — I610 Nontraumatic intracerebral hemorrhage in hemisphere, subcortical: Secondary | ICD-10-CM | POA: Diagnosis not present

## 2021-02-25 DIAGNOSIS — F331 Major depressive disorder, recurrent, moderate: Secondary | ICD-10-CM | POA: Diagnosis not present

## 2021-02-25 DIAGNOSIS — R269 Unspecified abnormalities of gait and mobility: Secondary | ICD-10-CM | POA: Diagnosis not present

## 2021-02-25 DIAGNOSIS — I69319 Unspecified symptoms and signs involving cognitive functions following cerebral infarction: Secondary | ICD-10-CM | POA: Diagnosis not present

## 2021-03-01 NOTE — Progress Notes (Signed)
Patient:  Jon [name]   DOB: 1955/09/25  MR Number: [id number]  Location: [facility] [place] [facility] [address] [place], [address] [id number] [facility] [place] [address] Dept: [phone]  Start: 3 PM End: 4 PM  Today's visit was an in person visit was conducted in my outpatient clinic office with the patient myself present.  His wife did accompany him to our offices but stayed in the waiting room during the visit.  Provider/Observer:     Hershal Coria PsyD  Chief Complaint:      Chief Complaint  Patient presents with   Cerebrovascular Accident   Other   Depression    Reason For Service:     Jon [name] presents as a 65 year old history of hypertension alcohol use.  The patient presented on January 08, 2016 with acute onset of headache blood pressure was elevated.  CT of the head showed right basal ganglia hemorrhage with 31 mL volume and 3 mm midline shift.  The patient has not been drinking since his stroke.  He has been attending outpatient physical therapy.  Ongoing issues with spasticity of his left upper extremity greater than left lower extremity.  The patient has had minimal response to baclofen.   The patient was referred by Dr. Wynn Banker for neuropsychological/psychological consultation.  The patient has continued to struggle with the loss of function following his stroke.  The patient reports that he thinks about his functional loss all of the time.  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  The patient has been continuing his art adjusting to his motor deficits the patient has been unable to play the guitar.  He can use his left arm or hand.  He has tried playing the keyboard with just his right hand but this is limited.  The patient reports that he is constantly thinking about playing songs in his head done this before.  However, he reports  that now he cannot play them.  He reports that he is suggesting too much of the other issues but does have some cognitive issues related to attention and memory.  The patient reports that he has completely quit drinking alcohol and quit smoking cigarettes and is lost 50 pounds.  He reports that his mood is okay except when he is around his wife and she starts singing again active and engaged.  The reason for service remains unchanged from the above information.  I have not seen the patient for approximately 1 year and he had been doing fairly well until recently.  His wife was concerned about increasing issues of depression and the patient knowledges that he has been somewhat more depressed recently but notes that it is mainly him avoiding discussing these issues with his wife as she has a tendency to become somewhat obsessive in her thinking and magnify some concerns.  His wife has significant PTSD symptoms.  He reports that his strategy of minimizing and avoiding talking about things does tend to exacerbate her worry and feels like some of that has to do with what is been going on with her rather than a significant worsening of his function.  Interventions Strategy:  Cognitive/behavioral therapeutic interventions as well as building coping skills and adaptive abilities following significant CVA.  Participation Level:   Active  Participation Quality:  Appropriate and Attentive      Behavioral Observation:  Well Groomed,  Alert, and Appropriate.   Current Psychosocial Factors: Patient reports that he has been able to adjust more with regard to his loss of motor function.  He is still quite distressed by his inability to engage in much efforts with his musical skills but reports that he has become more resigned to his motor deficits and does not as much as he can.  The patient reports that his wife's hoarding behaviors and obsessive-compulsive traits do make it difficult for him to move around the house  and he is concerned that if there was something like a fire in the house that he would had difficulty navigating around.  Content of Session:   Reviewed current symptoms and continue to work on therapeutic interventions around issues of coping skills and adjustment skills.  Current Status:   The patient reports that overall his depression has been better.  He reports that he has not been having seizure-like events since I last saw him and his wife confirms his overall improvement in functioning.  Patient Progress:   The patient reports that he has done better as far as his coping with his significant motor deficits and denies any significant worsening of his depression but does acknowledge continued issues with depression and frustration.  He denies any suicidal ideation.   Last Reviewed:   02/25/2021  Impression/Diagnosis:   The patient continues to have a lot of frustration with loss of function following his right basal ganglia hemorrhage and this is producing negative emotional response and frustrations.  The patient is having difficulties with motivation etc.  Diagnosis:   Spastic hemiplegia affecting dominant side (HCC)  Basal ganglia hemorrhage (HCC)  Cognitive deficit, post-stroke  Gait disturbance, post-stroke  Spastic hemiplegia affecting nondominant side (HCC)  Moderate episode of recurrent major depressive disorder (HCC)

## 2021-03-08 ENCOUNTER — Ambulatory Visit: Payer: Medicare Other | Admitting: Family Medicine

## 2021-03-08 ENCOUNTER — Encounter: Payer: Self-pay | Admitting: Diagnostic Neuroimaging

## 2021-03-08 ENCOUNTER — Other Ambulatory Visit: Payer: Self-pay

## 2021-03-08 ENCOUNTER — Ambulatory Visit: Payer: Medicare Other | Admitting: Diagnostic Neuroimaging

## 2021-03-08 VITALS — BP 107/63 | HR 56 | Ht 70.0 in | Wt 154.8 lb

## 2021-03-08 DIAGNOSIS — I69359 Hemiplegia and hemiparesis following cerebral infarction affecting unspecified side: Secondary | ICD-10-CM

## 2021-03-08 DIAGNOSIS — R569 Unspecified convulsions: Secondary | ICD-10-CM

## 2021-03-08 MED ORDER — LEVETIRACETAM 500 MG PO TABS: 500.0000 mg | ORAL_TABLET | Freq: Two times a day (BID) | ORAL | 4 refills | Status: DC

## 2021-03-08 NOTE — Progress Notes (Signed)
[facility]  PATIENT: Jon [name] DOB: 03-Mar-1956  REFERRING CLINICIAN: Roda Shutters / hospital follow up HISTORY FROM: patient and wife and chart review  REASON FOR VISIT: follow up   HISTORICAL  CHIEF COMPLAINT:  Chief Complaint  Patient presents with   Seizures    Rm 6, one year FU  wife- [name] "no seizure activity"    HISTORY OF PRESENT ILLNESS:   UPDATE (03/08/21, [facility]P): Since last visit, doing about the same. Symptoms are stable. No alleviating or aggravating factors. Tolerating meds. No seizures.  PRIOR HPI (07/05/19): 65 year old male with history of right basal ganglia intracerebral hemorrhage in 2017, here for evaluation of seizure.  Patient had new onset seizure on 05/03/2019, early in the morning when he was sleeping.  Patient started shaking and this woke up his wife.  She called EMS who arrived on scene.  Patient was confused and combative.  Patient went to the hospital for evaluation and treatment.  Patient was transferred to ICU due to agitation and need for IV sedation.  No electrographic seizures on EEG.  Spinal tap ruled out CNS infection.  Patient had somewhat prolonged postictal state but gradually returned to baseline.  He was discharged on levetiracetam 500 mg twice a day.  Since that time he is doing well.  No further seizures.  He is gradually returning to himself.  He was having some depression initially which is improving.   REVIEW OF SYSTEMS: Full 14 system review of systems performed and negative with exception of: As per HPI.  ALLERGIES: Allergies  Allergen Reactions   Gluten Meal Nausea And Vomiting   Lactose Intolerance (Gi) Nausea And Vomiting   Other     Tree nuts    Shellfish Allergy Swelling and Rash    HOME MEDICATIONS: Outpatient Medications Prior to Visit  Medication Sig Dispense Refill   diazepam (VALIUM) 2 MG tablet TAKE ONE TABLET BY MOUTH AT BEDTIME 90 tablet 0   fluticasone (FLONASE) 50 MCG/ACT nasal spray Place 1  spray into both nostrils daily. 1 g 2   folic acid (FOLVITE) 1 MG tablet Take 1 tablet (1 mg total) by mouth daily. (Patient taking differently: Take 1 mg by mouth 3 (three) times daily.) 30 tablet 0   levETIRAcetam (KEPPRA) 500 MG tablet Take 1 tablet (500 mg total) by mouth 2 (two) times daily. 180 tablet 4   metoprolol tartrate (LOPRESSOR) 25 MG tablet Take 1 tablet (25 mg total) by mouth 2 (two) times daily. 60 tablet 1   Multiple Vitamin (MULTIVITAMIN WITH MINERALS) TABS tablet Take 1 tablet by mouth daily.     pravastatin (PRAVACHOL) 40 MG tablet Take 1 tablet (40 mg total) by mouth daily at 6 PM. 30 tablet 1   QUEtiapine (SEROQUEL) 25 MG tablet Take 0.5 tablets (12.5 mg total) by mouth at bedtime. 30 tablet 1   sertraline (ZOLOFT) 50 MG tablet Take 1 tablet (50 mg total) by mouth daily. (Patient taking differently: Take 50 mg by mouth at bedtime.) 30 tablet 1   Solifenacin Succinate (VESICARE PO) Take by mouth. Taking once daily Mg unknown     tadalafil (CIALIS) 5 MG tablet Take 5 mg by mouth daily.      aspirin EC 81 MG tablet Take 1 tablet (81 mg total) by mouth daily. (Patient not taking: Reported on 03/08/2021)     Facility-Administered Medications Prior to Visit  Medication Dose Route Frequency Provider Last Rate Last Admin   gadopentetate dimeglumine (MAGNEVIST) injection 15 mL  15 mL Intravenous Once PRN Marvel Plan, MD        PAST MEDICAL HISTORY: Past Medical History:  Diagnosis Date   Bladder disorder 10/08/2001   Depression    Hypertension    Liver damage    Seizures (HCC)    none since 2018   Stroke [facility])     PAST SURGICAL HISTORY: Past Surgical History:  Procedure Laterality Date   TONSILLECTOMY AND ADENOIDECTOMY Bilateral    Age 59    FAMILY HISTORY: No family history on file.  SOCIAL HISTORY: Social History   Socioeconomic History   Marital status: Married    Spouse name: [name]   Number of children: Not on file   Years of education: Not on file    Highest education level: Not on file  Occupational History   Not on file  Tobacco Use   Smoking status: Former    Packs/day: 0.50    Years: 19.00    Pack years: 9.50    Types: Cigarettes    Quit date: 01/08/2016    Years since quitting: 5.1   Smokeless tobacco: Never  Vaping Use   Vaping Use: Never used  Substance and Sexual Activity   Alcohol use: Not Currently    Alcohol/week: 12.0 standard drinks    Types: 12 Glasses of wine per week    Comment: Wife reports chronic alcohol use. 01/12/16   Drug use: No    Types: Marijuana    Comment: teens to early 20's   Sexual activity: Not on file  Other Topics Concern   Not on file  Social History Narrative   Lives home, retired.  Married.    Social Determinants of Health   Financial Resource Strain: Not on file  Food Insecurity: Not on file  Transportation Needs: Not on file  Physical Activity: Not on file  Stress: Not on file  Social Connections: Not on file  Intimate Partner Violence: Not on file     PHYSICAL EXAM  GENERAL EXAM/CONSTITUTIONAL: Vitals:  Vitals:   03/08/21 1413  BP: 107/63  Pulse: (!) 56  Weight: 154 lb 12.8 oz (70.2 kg)  Height: 5\' 10"  (1.778 m)   Body mass index is 22.21 kg/m. Wt Readings from Last 3 Encounters:  03/08/21 154 lb 12.8 oz (70.2 kg)  07/21/20 159 lb (72.1 kg)  05/07/20 154 lb (69.9 kg)   Patient is in no distress; well developed, nourished and groomed; neck is supple  CARDIOVASCULAR: Examination of carotid arteries is normal; no carotid bruits Regular rate and rhythm, no murmurs Examination of peripheral vascular system by observation and palpation is normal  EYES: Ophthalmoscopic exam of optic discs and posterior segments is normal; no papilledema or hemorrhages No results found.  MUSCULOSKELETAL: Gait, strength, tone, movements noted in Neurologic exam below  NEUROLOGIC: MENTAL STATUS:  No flowsheet data found. awake, alert, oriented to person, place and time recent  and remote memory intact normal attention and concentration language fluent, comprehension intact, naming intact fund of knowledge appropriate  CRANIAL NERVE:  2nd - no papilledema on fundoscopic exam 2nd, 3rd, 4th, 6th - pupils equal and reactive to light, visual fields full to confrontation, extraocular muscles intact, no nystagmus 5th - facial sensation symmetric 7th - facial strength symmetric 8th - hearing intact 9th - palate elevates symmetrically, uvula midline 11th - shoulder shrug symmetric 12th - tongue protrusion midline  MOTOR:  INCREASED TONE IN LUE AND LLE; LUE 2; LLE 2-3 PROX AND 1-2 DISTAL RIGHT upper ext and low  ext 5/5 normal bulk and tone, full strength in the BUE, BLE  SENSORY:  normal and symmetric to light touch  COORDINATION:  finger-nose-finger, fine finger movements --> CANNOT ON LEFT SIDE DUE TO WEAKNESS  REFLEXES:  deep tendon reflexes BRISK IN LEFT SIDE and symmetric  GAIT/STATION:  IN WHEEL CHAIR; LEFT HEMIPARETIC GAIT WITH CANE     DIAGNOSTIC DATA (LABS, IMAGING, TESTING) - I reviewed patient records, labs, notes, testing and imaging myself where available.  Lab Results  Component Value Date   WBC 8.2 05/06/2019   HGB 13.3 05/06/2019   HCT 38.1 (L) 05/06/2019   MCV 90.3 05/06/2019   PLT 123 (L) 05/06/2019      Component Value Date/Time   NA 139 05/07/2019 0356   K 3.9 05/07/2019 0356   CL 104 05/07/2019 0356   CO2 21 (L) 05/07/2019 0356   GLUCOSE 64 (L) 05/07/2019 0356   BUN 5 (L) 05/07/2019 0356   CREATININE 0.66 05/07/2019 0356   CALCIUM 8.6 (L) 05/07/2019 0356   PROT 7.3 05/03/2019 0707   ALBUMIN 4.2 05/03/2019 0707   AST 22 05/03/2019 0707   ALT 14 05/03/2019 0707   ALKPHOS 53 05/03/2019 0707   BILITOT 1.1 05/03/2019 0707   GFRNONAA >60 05/07/2019 0356   GFRAA >60 05/07/2019 0356   Lab Results  Component Value Date   CHOL 250 (H) 01/09/2016   HDL 51 01/09/2016   LDLCALC 157 (H) 01/09/2016   TRIG 212 (H) 01/09/2016    CHOLHDL 4.9 01/09/2016   Lab Results  Component Value Date   HGBA1C 5.5 01/09/2016   Lab Results  Component Value Date   VITAMINB12 449 01/09/2016   Lab Results  Component Value Date   TSH 3.682 05/03/2019    05/03/19 EEG - This study is suggestive of severe diffuse encephalopathy, likely secondary to the ativan use. No seizures or definite epileptiform discharges were seen throughout the recording. - The excessive beta activity seen in the background is most likely due to the effect of benzodiazepine and is a benign EEG pattern.  05/04/19 MRI brain [I reviewed images myself and agree with interpretation. -VRP]  1. No acute intracranial abnormality. 2. Encephalomalacia related to a remote right basal ganglia hemorrhage.    ASSESSMENT AND PLAN  65 y.o. year old male here with new onset seizure 05/03/2019, history of right basal ganglia intracerebral hemorrhage in 2017, now on levetiracetam 500 mg twice a day.  Dx:  1. New onset seizure (HCC)   2. History of hemorrhagic stroke with residual hemiplegia (HCC)      PLAN:  NEW ONSET SEIZURE (Oct 2020; post stroke; 2017 right basal ganglia intracerebral hemorrhage) - continue levetiracetam 500mg  twice a day  MIGRAINE WITH AURA (mainly visual aura nowadays; several times per year) - monitor  Meds ordered this encounter  Medications   levETIRAcetam (KEPPRA) 500 MG tablet    Sig: Take 1 tablet (500 mg total) by mouth 2 (two) times daily.    Dispense:  180 tablet    Refill:  4   Return in about 1 year (around 03/08/2022) for virtual visit (15 min), with NP (Amy Lomax).    03/10/2022, MD 03/08/2021, 3:24 PM Certified in Neurology, Neurophysiology and Neuroimaging  [facility] [address], [address] [place], [place] [phone]

## 2021-03-18 DIAGNOSIS — I10 Essential (primary) hypertension: Secondary | ICD-10-CM | POA: Diagnosis not present

## 2021-03-18 DIAGNOSIS — E782 Mixed hyperlipidemia: Secondary | ICD-10-CM | POA: Diagnosis not present

## 2021-03-18 DIAGNOSIS — Z125 Encounter for screening for malignant neoplasm of prostate: Secondary | ICD-10-CM | POA: Diagnosis not present

## 2021-03-18 DIAGNOSIS — I69359 Hemiplegia and hemiparesis following cerebral infarction affecting unspecified side: Secondary | ICD-10-CM | POA: Diagnosis not present

## 2021-03-18 DIAGNOSIS — Z0000 Encounter for general adult medical examination without abnormal findings: Secondary | ICD-10-CM | POA: Diagnosis not present

## 2021-05-19 ENCOUNTER — Encounter: Payer: Medicare Other | Admitting: Psychology

## 2021-06-23 DIAGNOSIS — G43809 Other migraine, not intractable, without status migrainosus: Secondary | ICD-10-CM | POA: Diagnosis not present

## 2021-06-23 DIAGNOSIS — H35033 Hypertensive retinopathy, bilateral: Secondary | ICD-10-CM | POA: Diagnosis not present

## 2021-06-23 DIAGNOSIS — H25813 Combined forms of age-related cataract, bilateral: Secondary | ICD-10-CM | POA: Diagnosis not present

## 2021-06-23 DIAGNOSIS — H53453 Other localized visual field defect, bilateral: Secondary | ICD-10-CM | POA: Diagnosis not present

## 2021-06-28 DIAGNOSIS — H2511 Age-related nuclear cataract, right eye: Secondary | ICD-10-CM | POA: Diagnosis not present

## 2021-08-03 ENCOUNTER — Ambulatory Visit: Payer: Medicare Other | Admitting: Psychology

## 2021-08-13 ENCOUNTER — Ambulatory Visit: Payer: Medicare Other | Admitting: Physical Medicine & Rehabilitation

## 2021-09-06 ENCOUNTER — Encounter: Payer: Medicare HMO | Admitting: Psychology

## 2021-09-16 ENCOUNTER — Encounter: Payer: Medicare HMO | Attending: Physical Medicine & Rehabilitation | Admitting: Physical Medicine & Rehabilitation

## 2021-09-16 ENCOUNTER — Encounter: Payer: Self-pay | Admitting: Physical Medicine & Rehabilitation

## 2021-09-16 ENCOUNTER — Other Ambulatory Visit: Payer: Self-pay

## 2021-09-16 VITALS — BP 118/72 | HR 61 | Temp 98.8°F | Ht 70.0 in | Wt 158.2 lb

## 2021-09-16 DIAGNOSIS — E782 Mixed hyperlipidemia: Secondary | ICD-10-CM | POA: Diagnosis not present

## 2021-09-16 DIAGNOSIS — F3342 Major depressive disorder, recurrent, in full remission: Secondary | ICD-10-CM | POA: Diagnosis not present

## 2021-09-16 DIAGNOSIS — F1021 Alcohol dependence, in remission: Secondary | ICD-10-CM | POA: Diagnosis not present

## 2021-09-16 DIAGNOSIS — F17201 Nicotine dependence, unspecified, in remission: Secondary | ICD-10-CM | POA: Diagnosis not present

## 2021-09-16 DIAGNOSIS — I10 Essential (primary) hypertension: Secondary | ICD-10-CM | POA: Diagnosis not present

## 2021-09-16 DIAGNOSIS — J309 Allergic rhinitis, unspecified: Secondary | ICD-10-CM | POA: Diagnosis not present

## 2021-09-16 DIAGNOSIS — G8110 Spastic hemiplegia affecting unspecified side: Secondary | ICD-10-CM | POA: Insufficient documentation

## 2021-09-16 DIAGNOSIS — Z23 Encounter for immunization: Secondary | ICD-10-CM | POA: Diagnosis not present

## 2021-09-16 DIAGNOSIS — I69359 Hemiplegia and hemiparesis following cerebral infarction affecting unspecified side: Secondary | ICD-10-CM | POA: Diagnosis not present

## 2021-09-16 DIAGNOSIS — N400 Enlarged prostate without lower urinary tract symptoms: Secondary | ICD-10-CM | POA: Diagnosis not present

## 2021-09-16 NOTE — Patient Instructions (Signed)
Please wear brace Left hand 4 h per day or stretch wrist and fingers twice a day

## 2021-09-16 NOTE — Progress Notes (Addendum)
Subjective:    Patient ID: Jon [name], male    DOB: 1956/04/22, 66 y.o.   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  He completed inpatient rehabilitation as well as outpatient rehabilitation.  He had been receiving botulinum toxin injection for left upper extremity spasticity but wished to discontinue these.  The patient has not had no appreciable neurologic recovery in the left upper extremity.  She he does have some proximal left lower extremity movement is able to ambulate with a quad cane.  He is independent with dressing.  He still has poor balance and fell out of the bed requiring fire department to pick him up again.  He did not injure himself.  He is not doing any significant stretching or strengthening program.  He plays piano with the right hand much of the day  Pain Inventory Average Pain 0 Pain Right Now 0 My pain is No pain  LOCATION OF PAIN  Total left side weakness , right wrist pain  BOWEL Number of stools per week: 7   BLADDER Normal  Difficulty starting stream  BPH taking Flomax  Mobility walk without assistance use a cane how many minutes can you walk? 15 minutes maybe ability to climb steps?  yes use a wheelchair transfers alone Do you have any goals in this area?  yes  Function disabled: date disabled 6/62017 Do you have any goals in this area?  yes  Neuro/Psych bladder control problems weakness tremor trouble walking  Prior Studies Any changes since last visit?  no  Physicians involved in your care Any changes since last visit?  yes , Dr. Algernon Huxley with [facility]   History reviewed. No pertinent family history. Social History   Socioeconomic History   Marital status: Married    Spouse name: [name]   Number of children: Not on file   Years of education: Not on file   Highest education level: Not on file  Occupational History   Not on  file  Tobacco Use   Smoking status: Former    Packs/day: 0.50    Years: 19.00    Pack years: 9.50    Types: Cigarettes    Quit date: 01/08/2016    Years since quitting: 5.6   Smokeless tobacco: Never  Vaping Use   Vaping Use: Never used  Substance and Sexual Activity   Alcohol use: Not Currently    Alcohol/week: 12.0 standard drinks    Types: 12 Glasses of wine per week    Comment: Wife reports chronic alcohol use. 01/12/16   Drug use: No    Types: Marijuana    Comment: teens to early 20's   Sexual activity: Not on file  Other Topics Concern   Not on file  Social History Narrative   Lives home, retired.  Married.    Social Determinants of Health   Financial Resource Strain: Not on file  Food Insecurity: Not on file  Transportation Needs: Not on file  Physical Activity: Not on file  Stress: Not on file  Social Connections: Not on file   Past Surgical History:  Procedure Laterality Date   CATARACT EXTRACTION Bilateral    [facility] - removal & lense implants   TONSILLECTOMY AND ADENOIDECTOMY Bilateral    Age 66   Past Medical History:  Diagnosis Date   Bladder disorder 10/08/2001   Depression    Hypertension    Liver damage  Seizures (HCC)    none since 2018   Stroke (HCC)    Ht 5\' 10"  (1.778 m)    Wt 158 lb 3.2 oz (71.8 kg)    BMI 22.70 kg/m   Opioid Risk Score:   Fall Risk Score:  `1  Depression screen PHQ 2/9  Depression screen [facility] 2/9 09/16/2021 03/19/2020 12/03/2019 01/01/2019 01/01/2018 11/20/2017 10/06/2017  Decreased Interest 0 0 0 0 0 0 0  Down, Depressed, Hopeless 0 0 0 0 0 0 0  PHQ - 2 Score 0 0 0 0 0 0 0  Some recent data might be hidden    Review of Systems  Genitourinary:        Flomax for bph  Musculoskeletal:  Positive for gait problem.  Neurological:  Positive for tremors and weakness.      Objective:   Physical Exam Vitals and nursing note reviewed.  Constitutional:      Appearance: He is normal weight.  HENT:     Head:  Normocephalic and atraumatic.  Eyes:     Extraocular Movements: Extraocular movements intact.     Conjunctiva/sclera: Conjunctivae normal.     Pupils: Pupils are equal, round, and reactive to light.  Musculoskeletal:     Right lower leg: No edema.     Left lower leg: No edema.  Skin:    General: Skin is warm and dry.  Neurological:     Mental Status: He is alert and oriented to person, place, and time.  Psychiatric:        Mood and Affect: Mood normal.        Behavior: Behavior normal.    Tone  LUE Modified [place] Elbow flex 2 Wrist flex 3 Finger flex 3 Thumb flex 3      Assessment & Plan:   Left spastic hemi stable deficits a little tighter in wrist and finger flexors on left side , discussed using wrist splint at night  He still qualifies for permanent handicap sticker.  New form was completed today.  It should be good for 5 years. I will see him back in 1 year if you develop some increasing left upper extremity pain or increasing stiffness that does not respond to stretching would recommend repeat Botox injection.

## 2021-09-30 DIAGNOSIS — Z1211 Encounter for screening for malignant neoplasm of colon: Secondary | ICD-10-CM | POA: Diagnosis not present

## 2021-10-13 ENCOUNTER — Telehealth: Payer: Self-pay

## 2021-10-13 NOTE — Telephone Encounter (Signed)
Rosey Bath wife of patient called stating she got a jury duty summons and she states she don't feel comfortable leaving patient alone. Please advise ?

## 2021-10-19 ENCOUNTER — Encounter: Payer: Self-pay | Admitting: Physical Medicine & Rehabilitation

## 2021-10-19 NOTE — Telephone Encounter (Signed)
Jon [name] states that back in January she went to her own doctor appt and he was home alone and he fell in the hallway. He was able to call 911 for help. She is just afraid he could possibly fall and hit his head and not be able to call 911. ?

## 2021-10-20 NOTE — Telephone Encounter (Signed)
Rosey Bath notified and she will pick up letter ?

## 2021-11-05 ENCOUNTER — Telehealth: Payer: Self-pay

## 2021-11-05 NOTE — Telephone Encounter (Signed)
Patient's wife is calling to get Dr. Letta Pate' opinion on the MyoPro. She saw it on television and saw that it helps regain use of the arm. Please advise ?

## 2021-11-09 ENCOUNTER — Encounter: Payer: Self-pay | Admitting: Physical Medicine & Rehabilitation

## 2021-11-09 NOTE — Progress Notes (Signed)
Wrote letter for wife for jury duty  ?

## 2022-03-09 NOTE — Progress Notes (Signed)
PATIENT: Jon [name] DOB: [birth year]  REASON FOR VISIT: follow up HISTORY FROM: patient  Chief Complaint  Patient presents with   Follow-up    Pt with wife, rm 1. Overall stable. He denies seizure like activity. Has noticed tremors at times like when he sneezes his arm may begin to shake for a few seconds.      HISTORY OF PRESENT ILLNESS:  03/14/22 ALL: [name] returns for follow up for seizures. He was last seen by Dr Marjory Lies and doing well. He continues levetiracetam 500mg  BID. No recent seizure. He continues close follow up with Dr for co morbidity management and stroke prevention. He continues pravastatin. BP is usually well managed.   03/04/2020 ALL: Jon [name] is a 66 y.o. male here today for follow up for seizure. He is s/p ICH in 2017 resulting in left hemiplegia. He continues levetiracetam 500mg  BID. He is tolerating medication well and denies seizure activity. He does have a history of depression but states that it has not worsened. He is followed by Dr 2018, PCP and continues sertraline and quetiapine. He is also seen regularly by [facility] Rehab. He continues diazepam, prescribed by rehab, for spasticity. He is also followed by Dr , neuropsychology. He is able to walk at home but uses wheelchair for long distance traveling.   HISTORY: (copied from Dr Nicholos Johns note on 07/05/2019)  66 year old male with history of right basal ganglia intracerebral hemorrhage in 2017, here for evaluation of seizure.  Patient had new onset seizure on 05/03/2019, early in the morning when he was sleeping.  Patient started shaking and this woke up his wife.  She called EMS who arrived on scene.  Patient was confused and combative.  Patient went to the hospital for evaluation and treatment.  Patient was transferred to ICU due to agitation and need for IV sedation.  No electrographic seizures on EEG.  Spinal tap ruled out CNS infection.  Patient had somewhat prolonged  postictal state but gradually returned to baseline.  He was discharged on levetiracetam 500 mg twice a day.   Since that time he is doing well.  No further seizures.  He is gradually returning to himself.  He was having some depression initially which is improving.   REVIEW OF SYSTEMS: Out of a complete 14 system review of symptoms, the patient complains only of the following symptoms, gait impairment, left sided weakness, depression, anxiety, and all other reviewed systems are negative.  ALLERGIES: Allergies  Allergen Reactions   Gluten Meal Nausea And Vomiting   Lactose Intolerance (Gi) Nausea And Vomiting   Other     Tree nuts    Shellfish Allergy Swelling and Rash    HOME MEDICATIONS: Outpatient Medications Prior to Visit  Medication Sig Dispense Refill   diazepam (VALIUM) 2 MG tablet TAKE ONE TABLET BY MOUTH AT BEDTIME 90 tablet 0   fluticasone (FLONASE) 50 MCG/ACT nasal spray Place 1 spray into both nostrils daily. (Patient taking differently: Place 1 spray into both nostrils daily as needed.) 1 g 2   folic acid (FOLVITE) 1 MG tablet Take 1 tablet (1 mg total) by mouth daily. (Patient taking differently: Take 1 mg by mouth 3 (three) times daily.) 30 tablet 0   metoprolol tartrate (LOPRESSOR) 25 MG tablet Take 1 tablet (25 mg total) by mouth 2 (two) times daily. 60 tablet 1   Multiple Vitamin (MULTIVITAMIN WITH MINERALS) TABS tablet Take 1 tablet by mouth daily.     pravastatin (PRAVACHOL) 40  MG tablet Take 1 tablet (40 mg total) by mouth daily at 6 PM. 30 tablet 1   QUEtiapine (SEROQUEL) 25 MG tablet Take 0.5 tablets (12.5 mg total) by mouth at bedtime. 30 tablet 1   sertraline (ZOLOFT) 50 MG tablet Take 1 tablet (50 mg total) by mouth daily. (Patient taking differently: Take 50 mg by mouth at bedtime.) 30 tablet 1   Solifenacin Succinate (VESICARE PO) Take 5 mg by mouth daily. Taking once daily Mg unknown     tadalafil (CIALIS) 5 MG tablet Take 5 mg by mouth daily.       tamsulosin (FLOMAX) 0.4 MG CAPS capsule      aspirin EC 81 MG tablet Take 1 tablet (81 mg total) by mouth daily.     levETIRAcetam (KEPPRA) 500 MG tablet Take 1 tablet (500 mg total) by mouth 2 (two) times daily. 180 tablet 4   No facility-administered medications prior to visit.    PAST MEDICAL HISTORY: Past Medical History:  Diagnosis Date   Bladder disorder 10/08/2001   Depression    Hypertension    Liver damage    Seizures ([place])    none since 2018   Stroke [facility])     PAST SURGICAL HISTORY: Past Surgical History:  Procedure Laterality Date   CATARACT EXTRACTION Bilateral    [facility] - removal & lense implants   TONSILLECTOMY AND ADENOIDECTOMY Bilateral    Age 69    FAMILY HISTORY: History reviewed. No pertinent family history.  SOCIAL HISTORY: Social History   Socioeconomic History   Marital status: Married    Spouse name: [name]   Number of children: Not on file   Years of education: Not on file   Highest education level: Not on file  Occupational History   Not on file  Tobacco Use   Smoking status: Former    Packs/day: 0.50    Years: 19.00    Total pack years: 9.50    Types: Cigarettes    Quit date: 01/08/2016    Years since quitting: 6.1   Smokeless tobacco: Never  Vaping Use   Vaping Use: Never used  Substance and Sexual Activity   Alcohol use: Not Currently    Alcohol/week: 12.0 standard drinks of alcohol    Types: 12 Glasses of wine per week    Comment: Wife reports chronic alcohol use. 01/12/16   Drug use: No    Types: Marijuana    Comment: teens to early 20's   Sexual activity: Not on file  Other Topics Concern   Not on file  Social History Narrative   Lives home, retired.  Married.    Social Determinants of Health   Financial Resource Strain: Not on file  Food Insecurity: Not on file  Transportation Needs: Not on file  Physical Activity: Not on file  Stress: Not on file  Social Connections: Not on file  Intimate Partner  Violence: Not on file      PHYSICAL EXAM  Vitals:   03/14/22 1416  BP: 117/67  Pulse: (!) 54  Weight: 149 lb (67.6 kg)  Height: 5\' 11"  (1.803 m)    Body mass index is 20.78 kg/m.  Generalized: Well developed, in no acute distress  Cardiology: normal rate and rhythm, no murmur noted Respiratory: clear to auscultation bilaterally  Neurological examination  Mentation: Alert oriented to time, place, history taking. Follows all commands speech and language fluent Cranial nerve II-XII: Pupils were equal round reactive to light. Extraocular movements were full, visual  field were full on confrontational test. Facial sensation and strength were normal. Uvula tongue midline. Head turning and shoulder shrug  were normal and symmetric. Motor: The motor testing reveals 5 over 5 strength of right upper and lower extremity, left upper 2/5, left lower 3/5.  Sensory: Sensory testing is intact to soft touch on all 4 extremities. No evidence of extinction is noted.  Coordination: Cerebellar testing reveals good finger-nose-finger and heel-to-shin on right, unable to perform on left Gait and station: able to push to standing position with right UE. Hemiplegic gait. Stable with four prong cane.  Reflexes: Deep tendon reflexes are symmetric and normal bilaterally.   DIAGNOSTIC DATA (LABS, IMAGING, TESTING) - I reviewed patient records, labs, notes, testing and imaging myself where available.      No data to display           Lab Results  Component Value Date   WBC 8.2 05/06/2019   HGB 13.3 05/06/2019   HCT 38.1 (L) 05/06/2019   MCV 90.3 05/06/2019   PLT 123 (L) 05/06/2019      Component Value Date/Time   NA 139 05/07/2019 0356   K 3.9 05/07/2019 0356   CL 104 05/07/2019 0356   CO2 21 (L) 05/07/2019 0356   GLUCOSE 64 (L) 05/07/2019 0356   BUN 5 (L) 05/07/2019 0356   CREATININE 0.66 05/07/2019 0356   CALCIUM 8.6 (L) 05/07/2019 0356   PROT 7.3 05/03/2019 0707   ALBUMIN 4.2  05/03/2019 0707   AST 22 05/03/2019 0707   ALT 14 05/03/2019 0707   ALKPHOS 53 05/03/2019 0707   BILITOT 1.1 05/03/2019 0707   GFRNONAA >60 05/07/2019 0356   GFRAA >60 05/07/2019 0356   Lab Results  Component Value Date   CHOL 250 (H) 01/09/2016   HDL 51 01/09/2016   LDLCALC 157 (H) 01/09/2016   TRIG 212 (H) 01/09/2016   CHOLHDL 4.9 01/09/2016   Lab Results  Component Value Date   HGBA1C 5.5 01/09/2016   Lab Results  Component Value Date   VITAMINB12 449 01/09/2016   Lab Results  Component Value Date   TSH 3.682 05/03/2019       ASSESSMENT AND PLAN 66 y.o. year old male  has a past medical history of Bladder disorder (10/08/2001), Depression, Hypertension, Liver damage, Seizures (HCC), and Stroke (HCC). here with     ICD-10-CM   1. Seizure disorder (HCC)  G40.909        Simon is doing well, today. We will continue levetiracetam 500mg  twice daily. He will continue close follow up with PCP for depression, insomnia and stroke prevention. He will continue follow up with Dr and Dr Wynn Banker as advised. Healthy lifestyle habits encouraged. He will follow up with Kieth Brightly in 1 year, sooner if needed.    No orders of the defined types were placed in this encounter.    Meds ordered this encounter  Medications   levETIRAcetam (KEPPRA) 500 MG tablet    Sig: Take 1 tablet (500 mg total) by mouth 2 (two) times daily.    Dispense:  180 tablet    Refill:  4    Order Specific Question:   Supervising Provider    Answer:   [place] Anson Fret, FNP-C 03/14/2022, 3:25 PM [facility] [address] [place], [place] [phone]

## 2022-03-09 NOTE — Patient Instructions (Addendum)
Below is our plan:  We will levetiracetam 500mg  twice daily   Please make sure you are consistent with timing of seizure medication. I recommend annual visit with primary care provider (PCP) for complete physical and routine blood work. I recommend daily intake of vitamin D (400-800iu) and calcium (800-1000mg ) for bone health. Discuss Dexa screening with PCP.   According to [place] law, you can not drive unless you are seizure / syncope free for at least 6 months and under physician's care.  Please maintain precautions. Do not participate in activities where a loss of awareness could harm you or someone else. No swimming alone, no tub bathing, no hot tubs, no driving, no operating motorized vehicles (cars, ATVs, motocycles, etc), lawnmowers, power tools or firearms. No standing at heights, such as rooftops, ladders or stairs. Avoid hot objects such as stoves, heaters, open fires. Wear a helmet when riding a bicycle, scooter, skateboard, etc. and avoid areas of traffic. Set your water heater to 120 degrees or less.  Please make sure you are staying well hydrated. I recommend 50-60 ounces daily. Well balanced diet and regular exercise encouraged. Consistent sleep schedule with 6-8 hours recommended.   Please continue follow up with care team as directed.   Follow up with me in 1 year   You may receive a survey regarding today's visit. I encourage you to leave honest feed back as I do use this information to improve patient care. Thank you for seeing me today!

## 2022-03-14 ENCOUNTER — Ambulatory Visit: Payer: Medicare HMO | Admitting: Family Medicine

## 2022-03-14 ENCOUNTER — Encounter: Payer: Self-pay | Admitting: Family Medicine

## 2022-03-14 VITALS — BP 117/67 | HR 54 | Ht 71.0 in | Wt 149.0 lb

## 2022-03-14 DIAGNOSIS — G40909 Epilepsy, unspecified, not intractable, without status epilepticus: Secondary | ICD-10-CM

## 2022-03-14 MED ORDER — LEVETIRACETAM 500 MG PO TABS: 500.0000 mg | ORAL_TABLET | Freq: Two times a day (BID) | ORAL | 4 refills | Status: DC

## 2022-03-24 DIAGNOSIS — Z125 Encounter for screening for malignant neoplasm of prostate: Secondary | ICD-10-CM | POA: Diagnosis not present

## 2022-03-24 DIAGNOSIS — E782 Mixed hyperlipidemia: Secondary | ICD-10-CM | POA: Diagnosis not present

## 2022-03-24 DIAGNOSIS — F17201 Nicotine dependence, unspecified, in remission: Secondary | ICD-10-CM | POA: Diagnosis not present

## 2022-03-24 DIAGNOSIS — Z23 Encounter for immunization: Secondary | ICD-10-CM | POA: Diagnosis not present

## 2022-03-24 DIAGNOSIS — N400 Enlarged prostate without lower urinary tract symptoms: Secondary | ICD-10-CM | POA: Diagnosis not present

## 2022-03-24 DIAGNOSIS — J309 Allergic rhinitis, unspecified: Secondary | ICD-10-CM | POA: Diagnosis not present

## 2022-03-24 DIAGNOSIS — I10 Essential (primary) hypertension: Secondary | ICD-10-CM | POA: Diagnosis not present

## 2022-03-24 DIAGNOSIS — Z0000 Encounter for general adult medical examination without abnormal findings: Secondary | ICD-10-CM | POA: Diagnosis not present

## 2022-03-24 DIAGNOSIS — I69359 Hemiplegia and hemiparesis following cerebral infarction affecting unspecified side: Secondary | ICD-10-CM | POA: Diagnosis not present

## 2022-03-24 DIAGNOSIS — F1021 Alcohol dependence, in remission: Secondary | ICD-10-CM | POA: Diagnosis not present

## 2022-03-31 ENCOUNTER — Other Ambulatory Visit: Payer: Self-pay | Admitting: Family Medicine

## 2022-03-31 DIAGNOSIS — Z136 Encounter for screening for cardiovascular disorders: Secondary | ICD-10-CM

## 2022-04-01 ENCOUNTER — Ambulatory Visit
Admission: RE | Admit: 2022-04-01 | Discharge: 2022-04-01 | Disposition: A | Discharge status: Home / Self Care | Payer: Medicare HMO | Source: Ambulatory Visit | Attending: Family Medicine | Admitting: Family Medicine

## 2022-04-01 DIAGNOSIS — Z136 Encounter for screening for cardiovascular disorders: Secondary | ICD-10-CM

## 2022-04-01 DIAGNOSIS — Z87891 Personal history of nicotine dependence: Secondary | ICD-10-CM | POA: Diagnosis not present

## 2022-05-09 ENCOUNTER — Other Ambulatory Visit: Payer: Self-pay | Admitting: *Deleted

## 2022-05-09 DIAGNOSIS — Z122 Encounter for screening for malignant neoplasm of respiratory organs: Secondary | ICD-10-CM

## 2022-05-09 DIAGNOSIS — Z87891 Personal history of nicotine dependence: Secondary | ICD-10-CM

## 2022-06-02 DIAGNOSIS — E782 Mixed hyperlipidemia: Secondary | ICD-10-CM | POA: Diagnosis not present

## 2022-06-07 ENCOUNTER — Encounter: Payer: Self-pay | Admitting: Acute Care

## 2022-06-07 ENCOUNTER — Ambulatory Visit (INDEPENDENT_AMBULATORY_CARE_PROVIDER_SITE_OTHER): Payer: Medicare HMO | Admitting: Acute Care

## 2022-06-07 DIAGNOSIS — Z87891 Personal history of nicotine dependence: Secondary | ICD-10-CM | POA: Diagnosis not present

## 2022-06-07 NOTE — Patient Instructions (Signed)
Thank you for participating in the [facility] [facility]. It was our pleasure to meet you today. We will call you with the results of your scan within the next few days. Your scan will be assigned a Lung RADS category score by the physicians reading the scans.  This Lung RADS score determines follow up scanning.  See below for description of categories, and follow up screening recommendations. We will be in touch to schedule your follow up screening annually or based on recommendations of our providers. We will fax a copy of your scan results to your Primary Care Physician, or the physician who referred you to the program, to ensure they have the results. Please call the office if you have any questions or concerns regarding your scanning experience or results.  Our office number is [phone]. Please speak with Denise Phelps, RN. , or  Denise Buckner RN, They are  our Lung Cancer Screening RN.'s If They are unavailable when you call, Please leave a message on the voice mail. We will return your call at our earliest convenience.This voice mail is monitored several times a day.  Remember, if your scan is normal, we will scan you annually as long as you continue to meet the criteria for the program. (Age 55-77, Current smoker or smoker who has quit within the last 15 years). If you are a smoker, remember, quitting is the single most powerful action that you can take to decrease your risk of lung cancer and other pulmonary, breathing related problems. We know quitting is hard, and we are here to help.  Please let us know if there is anything we can do to help you meet your goal of quitting. If you are a former smoker, congratulations. We are proud of you! Remain smoke free! Remember you can refer friends or family members through the number above.  We will screen them to make sure they meet criteria for the program. Thank you for helping us take better care of you by  participating in Lung Screening.  You can receive free nicotine replacement therapy ( patches, gum or mints) by calling 1-800-QUIT NOW. Please call so we can get you on the path to becoming  a non-smoker. I know it is hard, but you can do this!  Lung RADS Categories:  Lung RADS 1: no nodules or definitely non-concerning nodules.  Recommendation is for a repeat annual scan in 12 months.  Lung RADS 2:  nodules that are non-concerning in appearance and behavior with a very low likelihood of becoming an active cancer. Recommendation is for a repeat annual scan in 12 months.  Lung RADS 3: nodules that are probably non-concerning , includes nodules with a low likelihood of becoming an active cancer.  Recommendation is for a 6-month repeat screening scan. Often noted after an upper respiratory illness. We will be in touch to make sure you have no questions, and to schedule your 6-month scan.  Lung RADS 4 A: nodules with concerning findings, recommendation is most often for a follow up scan in 3 months or additional testing based on our provider's assessment of the scan. We will be in touch to make sure you have no questions and to schedule the recommended 3 month follow up scan.  Lung RADS 4 B:  indicates findings that are concerning. We will be in touch with you to schedule additional diagnostic testing based on our provider's  assessment of the scan.  Other options for assistance in smoking cessation (   As covered by your insurance benefits)  Hypnosis for smoking cessation  [facility] [phone]  Acupuncture for smoking cessation  [facility] [phone]   

## 2022-06-07 NOTE — Progress Notes (Signed)
Virtual Visit via Video Note  I connected with Jon [name] on 06/07/22 at  2:00 PM EST by a video enabled telemedicine application and verified that I am speaking with the correct person using two identifiers.  Location: Patient: at home Provider: [address] [place], [place], [address]    I discussed the limitations of evaluation and management by telemedicine and the availability of in person appointments. The patient expressed understanding and agreed to proceed.   Shared Decision Making Visit Lung Cancer Screening Program [id number])   Eligibility: Age 66 y.o. Pack Years Smoking History Calculation  20 pack year smoking history (# packs/per year x # years smoked) Recent History of coughing up blood  no Unexplained weight loss? no ( >Than 15 pounds within the last 6 months ) Prior History Lung / other cancer no (Diagnosis within the last 5 years already requiring surveillance chest CT Scans). Smoking Status Former Smoker Former Smokers: Years since quit:  NA  Quit Date:  2017  Visit Components: Discussion included one or more decision making aids. yes Discussion included risk/benefits of screening. yes Discussion included potential follow up diagnostic testing for abnormal scans. yes Discussion included meaning and risk of over diagnosis. yes Discussion included meaning and risk of False Positives. yes Discussion included meaning of total radiation exposure. yes  Counseling Included: Importance of adherence to annual lung cancer LDCT screening. yes Impact of comorbidities on ability to participate in the program. yes Ability and willingness to under diagnostic treatment. yes  Smoking Cessation Counseling: Current Smokers:  Discussed importance of smoking cessation. yes Information about tobacco cessation classes and interventions provided to patient. yes Patient provided with "ticket" for LDCT Scan. yes Symptomatic Patient. no  Counseling NA Diagnosis Code:  Tobacco Use Z72.0 Asymptomatic Patient yes  Counseling (Intermediate counseling: > three minutes counseling) [id number] Former Smokers:  Discussed the importance of maintaining cigarette abstinence. yes Diagnosis Code: Personal History of Nicotine Dependence. J88.325 Information about tobacco cessation classes and interventions provided to patient. Yes Patient provided with "ticket" for LDCT Scan. yes Written Order for Lung Cancer Screening with LDCT placed in Epic. Yes (CT Chest Lung Cancer Screening Low Dose W/O CM) [id number] Z12.2-Screening of respiratory organs Z87.891-Personal history of nicotine dependence  I spent 25 minutes of face to face time/virtual visit time  with  Jon [name] discussing the risks and benefits of lung cancer screening. We took the time to pause the power point at intervals to allow for questions to be asked and answered to ensure understanding. We discussed that he had taken the single most powerful action possible to decrease his risk of developing lung cancer when he quit smoking. I counseled him to remain smoke free, and to contact me if he ever had the desire to smoke again so that I can provide resources and tools to help support the effort to remain smoke free. We discussed the time and location of the scan, and that either  Abigail Miyamoto RN, Karlton Lemon, RN or I  or I will call / send a letter with the results within  24-72 hours of receiving them. He has the office contact information in the event he needs to speak with me,  he verbalized understanding of all of the above and had no further questions upon leaving the office.     I explained to the patient that there has been a high incidence of coronary artery disease noted on these exams. I explained that this is a non-gated exam therefore  degree or severity cannot be determined. This patient is on statin therapy. I have asked the patient to follow-up with their PCP regarding any incidental finding of coronary artery  disease and management with diet or medication as they feel is clinically indicated. The patient verbalized understanding of the above and had no further questions.    Bevelyn Ngo, NP 06/07/2022

## 2022-06-08 ENCOUNTER — Ambulatory Visit
Admission: RE | Admit: 2022-06-08 | Discharge: 2022-06-08 | Disposition: A | Discharge status: Home / Self Care | Payer: Medicare HMO | Source: Ambulatory Visit | Attending: Acute Care | Admitting: Acute Care

## 2022-06-08 DIAGNOSIS — Z122 Encounter for screening for malignant neoplasm of respiratory organs: Secondary | ICD-10-CM

## 2022-06-08 DIAGNOSIS — Z87891 Personal history of nicotine dependence: Secondary | ICD-10-CM | POA: Diagnosis not present

## 2022-06-10 ENCOUNTER — Other Ambulatory Visit: Payer: Self-pay | Admitting: Acute Care

## 2022-06-10 DIAGNOSIS — Z122 Encounter for screening for malignant neoplasm of respiratory organs: Secondary | ICD-10-CM

## 2022-06-10 DIAGNOSIS — Z87891 Personal history of nicotine dependence: Secondary | ICD-10-CM

## 2022-06-24 ENCOUNTER — Other Ambulatory Visit: Payer: Self-pay | Admitting: Family Medicine

## 2022-06-24 DIAGNOSIS — I2510 Atherosclerotic heart disease of native coronary artery without angina pectoris: Secondary | ICD-10-CM

## 2022-08-04 ENCOUNTER — Other Ambulatory Visit: Payer: Medicare HMO

## 2022-09-01 ENCOUNTER — Ambulatory Visit
Admission: RE | Admit: 2022-09-01 | Discharge: 2022-09-01 | Disposition: A | Discharge status: Home / Self Care | Payer: No Typology Code available for payment source | Source: Ambulatory Visit | Attending: Family Medicine | Admitting: Family Medicine

## 2022-09-01 DIAGNOSIS — E785 Hyperlipidemia, unspecified: Secondary | ICD-10-CM | POA: Diagnosis not present

## 2022-09-01 DIAGNOSIS — I2510 Atherosclerotic heart disease of native coronary artery without angina pectoris: Secondary | ICD-10-CM

## 2022-09-05 DIAGNOSIS — H35033 Hypertensive retinopathy, bilateral: Secondary | ICD-10-CM | POA: Diagnosis not present

## 2022-09-05 DIAGNOSIS — H53453 Other localized visual field defect, bilateral: Secondary | ICD-10-CM | POA: Diagnosis not present

## 2022-09-05 DIAGNOSIS — H02831 Dermatochalasis of right upper eyelid: Secondary | ICD-10-CM | POA: Diagnosis not present

## 2022-09-05 DIAGNOSIS — G43809 Other migraine, not intractable, without status migrainosus: Secondary | ICD-10-CM | POA: Diagnosis not present

## 2022-09-16 ENCOUNTER — Encounter: Payer: Self-pay | Admitting: Physical Medicine & Rehabilitation

## 2022-09-16 ENCOUNTER — Encounter: Payer: Medicare HMO | Attending: Physical Medicine & Rehabilitation | Admitting: Physical Medicine & Rehabilitation

## 2022-09-16 VITALS — BP 102/47 | HR 53 | Ht 70.0 in | Wt 156.0 lb

## 2022-09-16 DIAGNOSIS — G8110 Spastic hemiplegia affecting unspecified side: Secondary | ICD-10-CM

## 2022-09-16 NOTE — Progress Notes (Signed)
Subjective:    Patient ID: Jon [name], male    DOB: Jul 25, 1956, 67 y.o.   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 He states that he is not doing very much stretching but thinks he can do more. We discussed more OT however he did not wish to pursue this option His wife is having increased difficulty pushing him on the wheelchair on community outings.  He would like to pursue a motorized chair.  Plays a lot of keyboard and does composing  Pain Inventory Average Pain 0 Pain Right Now 0 My pain is  none  LOCATION OF PAIN  n/a  BOWEL Number of stools per week: 3 Oral laxative use No  Type of laxative n/a Enema or suppository use No  History of colostomy No  Incontinent No   BLADDER Normal In and out cath, frequency . Able to self cath  . Bladder incontinence No  Frequent urination Yes  Leakage with coughing No  Difficulty starting stream No  Incomplete bladder emptying No    Mobility use a cane ability to climb steps?  yes do you drive?  no Do you have any goals in this area?  yes  Function disabled: date disabled 2017  Neuro/Psych bladder control problems weakness tremor trouble walking loss of taste or smell  Prior Studies Any changes since last visit?  yes  Physicians involved in your care Primary care     No family history on file. Social History   Socioeconomic History   Marital status: Married    Spouse name: [name]   Number of children: Not on file   Years of education: Not on file   Highest education level: Not on file  Occupational History   Not on file  Tobacco Use   Smoking status: Former    Packs/day: 1.00    Years: 23.00    Total pack years: 23.00    Types: Cigarettes    Quit date: 01/08/2016    Years since quitting: 6.6   Smokeless tobacco: Never  Vaping Use   Vaping Use: Never used  Substance and Sexual Activity    Alcohol use: Not Currently    Alcohol/week: 12.0 standard drinks of alcohol    Types: 12 Glasses of wine per week    Comment: Wife reports chronic alcohol use. 01/12/16   Drug use: No    Types: Marijuana    Comment: teens to early 20's   Sexual activity: Not on file  Other Topics Concern   Not on file  Social History Narrative   Lives home, retired.  Married.    Social Determinants of Health   Financial Resource Strain: Not on file  Food Insecurity: Not on file  Transportation Needs: Not on file  Physical Activity: Not on file  Stress: Not on file  Social Connections: Not on file   Past Surgical History:  Procedure Laterality Date   CATARACT EXTRACTION Bilateral    [facility] - removal & lense implants   TONSILLECTOMY AND ADENOIDECTOMY Bilateral    Age 29   Past Medical History:  Diagnosis Date   Bladder disorder 10/08/2001   Depression    Hypertension    Liver damage    Seizures ([facility])    none since 2018   Stroke ([facility])    Ht '5\' 10"'$  (1.778 m)   Wt 156 lb (70.8 kg)  BMI 22.38 kg/m   Opioid Risk Score:   Fall Risk Score:  `1  Depression screen [facility] 2/9     09/16/2022    1:37 PM 09/16/2021    1:41 PM 03/19/2020    3:01 PM 12/03/2019   12:07 PM 01/01/2019    1:22 PM 01/01/2018   11:56 AM 11/20/2017   12:58 PM  Depression screen [facility] 2/9  Decreased Interest 0 0 0 0 0 0 0  Down, Depressed, Hopeless 0 0 0 0 0 0 0  PHQ - 2 Score 0 0 0 0 0 0 0     Review of Systems  All other systems reviewed and are negative.      Objective:   Physical Exam  Motor strength is 3 - at the left deltoid trace elbow flexion and extension trace finger flexion 0 finger extension 0 wrist flexion extension Left lower extremity 3 - hip flexion 4 - knee extension and 3 - ankle dorsiflexion 5/5 strength in the right side Sensation is absent to light touch left hemibody. Ambulates with a cane short step length slow cadence. No knee instability Tone MAS 2 at the left elbow  flexor, 2 at the elbow extensor 3 at the finger flexors  as well as wrist flexors. There is clonus at the knee extensors and ankle plantar flexors on the left side.  Nonsustained approximately 4-5 beats    Assessment & Plan:   #1.  History of right thalamic bleed with left hemiparesis left hemisensory deficits. No need for repeat botulinum toxin injection at the time spasticity is relatively well-managed Encourage patient to do more stretching for left shoulder contracture In terms of community mobility will ask for motorized wheelchair evaluation.  We discussed that there are strict criteria for insurance payment, he will see my nurse practitioner for this.

## 2022-10-13 ENCOUNTER — Encounter: Payer: Self-pay | Admitting: Registered Nurse

## 2022-10-13 ENCOUNTER — Encounter: Payer: Medicare HMO | Attending: Physical Medicine & Rehabilitation | Admitting: Registered Nurse

## 2022-10-13 VITALS — BP 110/66 | HR 56 | Ht 70.0 in | Wt 155.2 lb

## 2022-10-13 DIAGNOSIS — G8110 Spastic hemiplegia affecting unspecified side: Secondary | ICD-10-CM | POA: Diagnosis not present

## 2022-10-13 NOTE — Progress Notes (Signed)
Subjective:    Patient ID: Jon [name], male    DOB: 1955/12/02, 67 y.o.   MRN: [id number]  HPI: Jon [name] is a 67 y.o. 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 Mr. Delanuez has the Physical and Mental ability to operate the Power Wheelchair. The Power wheelchair will  be great asset for Mr. Mullikin. This will help him with his mobility in his home, also improving his independence with activities of daily living. He doesn't have the ability to self propel a manual wheelchair due to his Spastic Hemiplegia affecting non dominant side. A order will be placed for Physical Therapy evaluation.  He denies any pain at this time. He rates his pain 0.   Pain Inventory Average Pain 0 Pain Right Now 0 My pain is  no pain  BOWEL Number of stools per week: 2  BLADDER Normal  Mobility use a cane how many minutes can you walk? 10 ability to climb steps?  yes do you drive?  no  Function disabled: date disabled 2017  Neuro/Psych weakness numbness tremor tingling trouble walking spasms loss of taste or smell  Prior Studies Any changes since last visit?  no  Physicians involved in your care Any changes since last visit?  no   No family history on file. Social History   Socioeconomic History   Marital status: Married    Spouse name: [name]   Number of children: Not on file   Years of education: Not on file   Highest education level: Not on file  Occupational History   Not on file  Tobacco Use   Smoking status: Former    Packs/day: 1.00    Years: 23.00    Additional pack years: 0.00    Total pack years: 23.00    Types: Cigarettes    Quit date: 01/08/2016    Years since quitting: 6.7   Smokeless tobacco: Never  Vaping Use   Vaping Use: Never used  Substance and Sexual Activity   Alcohol use: Not Currently    Alcohol/week: 12.0 standard drinks of alcohol    Types: 12 Glasses of wine per week    Comment: Wife reports chronic alcohol use.  01/12/16   Drug use: No    Types: Marijuana    Comment: teens to early 20's   Sexual activity: Not on file  Other Topics Concern   Not on file  Social History Narrative   Lives home, retired.  Married.    Social Determinants of Health   Financial Resource Strain: Not on file  Food Insecurity: Not on file  Transportation Needs: Not on file  Physical Activity: Not on file  Stress: Not on file  Social Connections: Not on file   Past Surgical History:  Procedure Laterality Date   CATARACT EXTRACTION Bilateral    [facility] - removal & lense implants   TONSILLECTOMY AND ADENOIDECTOMY Bilateral    Age 48   Past Medical History:  Diagnosis Date   Bladder disorder 10/08/2001   Depression    Hypertension    Liver damage    Seizures ([facility])    none since 2018   Stroke ([facility])    BP 110/66   Pulse (!) 56   Ht 5\' 10"  (1.778 m)   Wt 155 lb 3.2 oz (70.4 kg)   SpO2 98%   BMI 22.27 kg/m   Opioid Risk Score:   Fall Risk Score:  `1  Depression screen [facility] 2/9  10/13/2022    2:13 PM 09/16/2022    1:37 PM 09/16/2021    1:41 PM 03/19/2020    3:01 PM 12/03/2019   12:07 PM 01/01/2019    1:22 PM 01/01/2018   11:56 AM  Depression screen PHQ 2/9  Decreased Interest 1 0 0 0 0 0 0  Down, Depressed, Hopeless 1 0 0 0 0 0 0  PHQ - 2 Score 2 0 0 0 0 0 0     Review of Systems  Constitutional: Negative.   HENT: Negative.    Eyes: Negative.   Respiratory: Negative.    Cardiovascular: Negative.   Gastrointestinal: Negative.   Endocrine: Negative.   Genitourinary: Negative.   Musculoskeletal:  Positive for gait problem.       Spasms  Skin: Negative.   Allergic/Immunologic: Negative.   Neurological:  Positive for tremors, weakness and numbness.       Tingling  Hematological: Negative.   Psychiatric/Behavioral:  Positive for dysphoric mood.   All other systems reviewed and are negative.      Objective:   Physical Exam Vitals and nursing note reviewed.  Constitutional:       Appearance: Normal appearance.  Cardiovascular:     Rate and Rhythm: Normal rate and regular rhythm.     Pulses: Normal pulses.     Heart sounds: Normal heart sounds.  Pulmonary:     Effort: Pulmonary effort is normal.     Breath sounds: Normal breath sounds.  Musculoskeletal:     Cervical back: Normal range of motion and neck supple.     Comments: Normal Muscle Bulk and Muscle Testing Reveals:  Upper Extremities: Right: Full ROM and Muscle Strength 5/5 Left Upper Extremity: Paralysis and Muscle Strength 0/5 Lower Extremities: Right: Full ROM and Muscle Strength 5/5 Left Lower Extremity: Decreased ROM and Muscle Strength 4/5 Arises from Table slowly using cane for support     Skin:    General: Skin is warm and dry.  Neurological:     Mental Status: He is alert and oriented to person, place, and time.  Psychiatric:        Mood and Affect: Mood normal.        Behavior: Behavior normal.         Assessment & Plan:  History of right thalamic bleed with left hemiparesis left hemisensory deficits. Continue current medication regimen. Continue HEP as tolerated for left shoulder contracture. Continue to Monitor.   F/U with Dr Letta Pate.

## 2022-11-09 DIAGNOSIS — F3342 Major depressive disorder, recurrent, in full remission: Secondary | ICD-10-CM | POA: Diagnosis not present

## 2022-11-09 DIAGNOSIS — D696 Thrombocytopenia, unspecified: Secondary | ICD-10-CM | POA: Diagnosis not present

## 2022-11-09 DIAGNOSIS — R8290 Unspecified abnormal findings in urine: Secondary | ICD-10-CM | POA: Diagnosis not present

## 2022-11-09 DIAGNOSIS — I10 Essential (primary) hypertension: Secondary | ICD-10-CM | POA: Diagnosis not present

## 2022-11-09 DIAGNOSIS — I69359 Hemiplegia and hemiparesis following cerebral infarction affecting unspecified side: Secondary | ICD-10-CM | POA: Diagnosis not present

## 2022-11-09 DIAGNOSIS — A6002 Herpesviral infection of other male genital organs: Secondary | ICD-10-CM | POA: Diagnosis not present

## 2022-11-09 DIAGNOSIS — I700 Atherosclerosis of aorta: Secondary | ICD-10-CM | POA: Diagnosis not present

## 2022-11-09 DIAGNOSIS — N400 Enlarged prostate without lower urinary tract symptoms: Secondary | ICD-10-CM | POA: Diagnosis not present

## 2022-11-09 DIAGNOSIS — E782 Mixed hyperlipidemia: Secondary | ICD-10-CM | POA: Diagnosis not present

## 2022-11-09 DIAGNOSIS — J309 Allergic rhinitis, unspecified: Secondary | ICD-10-CM | POA: Diagnosis not present

## 2022-12-27 ENCOUNTER — Ambulatory Visit: Payer: Medicare HMO | Admitting: Physical Therapy

## 2023-01-09 ENCOUNTER — Ambulatory Visit: Payer: Medicare HMO | Attending: Registered Nurse

## 2023-01-09 ENCOUNTER — Other Ambulatory Visit: Payer: Self-pay

## 2023-01-09 DIAGNOSIS — R2689 Other abnormalities of gait and mobility: Secondary | ICD-10-CM | POA: Insufficient documentation

## 2023-01-09 DIAGNOSIS — G8110 Spastic hemiplegia affecting unspecified side: Secondary | ICD-10-CM | POA: Insufficient documentation

## 2023-01-09 DIAGNOSIS — M6281 Muscle weakness (generalized): Secondary | ICD-10-CM | POA: Insufficient documentation

## 2023-01-09 DIAGNOSIS — I69954 Hemiplegia and hemiparesis following unspecified cerebrovascular disease affecting left non-dominant side: Secondary | ICD-10-CM | POA: Diagnosis not present

## 2023-01-09 NOTE — Therapy (Signed)
OUTPATIENT PHYSICAL THERAPY WHEELCHAIR EVALUATION   Patient Name: Jon [name] MRN: [id number] DOB:1955/10/05, 67 y.o., male Today's Date: 01/09/2023  END OF SESSION:  PT End of Session - 01/09/23 1855     Visit Number 1    Number of Visits 1    PT Start Time 1500    PT Stop Time 1545    PT Time Calculation (min) 45 min    Equipment Utilized During Treatment Gait belt    Activity Tolerance Patient tolerated treatment well    Behavior During Therapy WFL for tasks assessed/performed             Past Medical History:  Diagnosis Date   Bladder disorder 10/08/2001   Depression    Hypertension    Liver damage    Seizures (HCC)    none since 2018   Stroke [facility])    Past Surgical History:  Procedure Laterality Date   CATARACT EXTRACTION Bilateral    [facility] - removal & lense implants   TONSILLECTOMY AND ADENOIDECTOMY Bilateral    Age 41   Patient Active Problem List   Diagnosis Date Noted   History of hemorrhagic stroke with residual hemiplegia (HCC)    New onset seizure (HCC) 05/03/2019   Spastic hemiplegia affecting dominant side (HCC) 08/24/2017   Neuropathic pain of left shoulder 05/19/2016   Alcohol use disorder, severe, dependence (HCC) 02/15/2016   Muscle contusion 02/11/2016   Spastic hemiplegia affecting nondominant side (HCC)    Muscle spasticity    Poor appetite    Hyperlipidemia 01/14/2016   Depression 01/14/2016   Obesity 01/14/2016   Hyperglycemia 01/14/2016   Urinary tract infection, site not specified 01/14/2016   Basal ganglia hemorrhage (HCC) 01/14/2016   Hemiparesis affecting nondominant side as late effect of cerebrovascular accident (HCC)    Cognitive deficit, post-stroke    Left hemiparesis (HCC)    Essential hypertension    Dysphagia, post-stroke    Gait disturbance, post-stroke    Hyponatremia    Thrombocytopenia (HCC)    Hemorrhagic stroke (HCC) R basal ganglia d/t HTN 01/08/2016    PCP: Elias Else  REFERRING  PROVIDER: Jones Bales, NP  THERAPY DIAG:  Hemiplegia and hemiparesis following unspecified cerebrovascular disease affecting left non-dominant side (HCC)  Other abnormalities of gait and mobility  Muscle weakness (generalized)  Rationale for Evaluation and Treatment Rehabilitation  SUBJECTIVE:                                                                                                                                                                                           SUBJECTIVE STATEMENT: Pt present or  wheelchair evaluation. Pt had stroke 01/08/2016. Pt started walking with large based quad cane 5 years ago. Pt denies any near falls or falls in last 6 months.  PRECAUTIONS: None  WEIGHT BEARING RESTRICTIONS No    OCCUPATION: Retired  PLOF:  Requires assistive device for independence, Needs assistance with ADLs, Needs assistance with homemaking, Needs assistance with gait, and Needs assistance with transfers  PATIENT GOALS: Get a wheelchair for mobility         MEDICAL HISTORY:  Primary diagnosis onset: 01/08/2016 Diagnosis  Code:  Diagnosis:    Diagnosis code:       Diagnosis:  G81.10 (ICD-10-CM) - Spastic hemiplegia affecting nondominant side (HCC) Diagnosis  Code:  Diagnosis:   [] Progressive disease  Relevant future surgeries:     Height:  Weight:  Explain recent changes or trends in weight:      History:  Past Medical History:  Diagnosis Date   Bladder disorder 10/08/2001   Depression    Hypertension    Liver damage    Seizures (HCC)    none since 2018   Stroke [facility])             Cardio Status:  Functional Limitations:   [x] Intact  []  Impaired      Respiratory Status:  Functional Limitations:   [x] Intact  [] Impaired   [] SOB [] COPD [] O2 Dependent ______LPM  [] Ventilator Dependent  Resp equip:                                                     Objective Measure(s):   Orthotics:   [] Amputee:                                                              [] Prosthesis:        HOME ENVIRONMENT:  [x] House [] Condo/town home [] Apartment [] Asst living [] LTCF         [x] Own  [] Rent   [] Lives alone [] Lives with others -                             Hours without assistance:   [] Home is accessible to patient                                 Storage of wheelchair:  [x] In home   [] Other Comments:        COMMUNITY :  TRANSPORTATION:  [x] Car [] Media planner [] Adapted w/c Lift []  Ambulance [] Other:                     [] Sits in wheelchair during transport   Where is w/c stored during transport?  [] [place]  []  [employer]  r   [] Self-Driver       Drive while in  Biomedical scientist [] yes [x] no   Employment and/or school:  Specific requirements pertaining to mobility        Other:  COMMUNICATION:  Verbal Communication  [x] WFL [] receptive [] WFL [] expressive [] Understandable  [] Difficult to understand  [] non-communicative  Primary Language:_______English_______ 2nd:_____________  Communication  provided by:[x] Patient [] Family [] Caregiver [] Education administrator Uses an Paramedic device     Manufacturer/Model :                                                                MOBILITY/BALANCE:  Sitting Balance  Standing Balance  Transfers  Ambulation   [x] WFL      [] WFL  [x] Independent  []  Independent   [] Uses UE for balance in sitting Comments:  [x] Uses UE/device for stability Comments:  []  Min assist  [x]  Ambulates independently with       device:__Large based quad cane_________________      []  Mod assist  []  Able to ambulate ______ feet        safely/functionally/independently   []  Min assist  []  Min assist  []  Max assist  []  Non-functional ambulator         History/High risk of falls   []  Mod assist  []  Mod assist  []  Dependent  []  Unable to ambulate   []  Max  assist  []  Max assist  Transfer method:[] 1 person [] 2 person [] sliding board [] squat pivot [] stand pivot [] mechanical patient lift  [] other:   []  Unable  []  Unable     Fall History: # of falls in the past 6 months? 0 # of "near" falls in the past 6 months? 0    CURRENT SEATING / MOBILITY:  Current Mobility Device: [] None [x] Cane/Walker [x] Manual [] Dependent [] Dependent w/ Tilt rScooter  [] Power (type of control):   Manufacturer:  Model:  Serial #:   Size:  Color:  Age:   Purchased by whom:   Current condition of mobility base:    Current seating system:                                                                       Age of seating system:    Describe posture in present seating system:    Is the current mobility meeting medical necessity?:  [] Yes [x] No Describe: Pt has manual wheelchair that is almost 66 years old and not functioning as well. Pt uses manual wheelchair in the house but is unable to carry things in his hands as he needs his R UE and R LE to propel the wheelchair 10 feet.                                    Ability to complete Mobility-Related Activities of Daily Living (MRADL's) with Current Mobility Device:   Move room to room  [x] Independent  [] Min [] Mod [] Max assist  [] Unable  Comments:   Meal prep  [x] Independent  [] Min [] Mod [] Max assist  [] Unable    Feeding  [x] Independent  [] Min [] Mod [] Max assist  [] Unable    Bathing  [] Independent  [x] Min [] Mod [] Max assist  [] Unable    Grooming  [x] Independent  [] Min [] Mod [] Max assist  [] Unable    UE dressing  [] Independent  [] Min [x] Mod [] Max assist  [] Unable  LE dressing  [] Independent   [] Min [x] Mod [] Max assist  [] Unable    Toileting  [] Independent  [x] Min [] Mod [] Max assist  [] Unable    Bowel Mgt: [x]  Continent []  Incontinent []  Accidents []  Diapers []  Colostomy []  Bowel Program:  Bladder Mgt: [x]  Continent []  Incontinent []  Accidents []  Diapers []  Urinal []  Intermittent Cath []  Indwelling Cath []  Supra-pubic Cath     Current Mobility Equipment Trialed/ Ruled Out:    Does not meet mobility needs due to:    Mark all boxes that indicate inability to use the specific equipment  listed     Meets needs for safe  independent functional  ambulation  / mobility    Risk of  Falling or History of Falls    Enviromental limitations      Cognition    Safety concerns with  physical ability    Decreased / limitations endurance  & strength     Decreased / limitations  motor skills  & coordination    Pain    Pace /  Speed    Cardiac and/or  respiratory condition    Contra - indicated by diagnosis   Cane/Crutches  []   []   []   []   [x]   [x]   []   []   [x]   []   []    Walker / Rollator  []  NA   []   []   []   []   [x]   [x]   [x]   []   [x]   []   []     Manual Wheelchair [id number]:  []  NA  []   []   [x]   []   []   [x]   [x]   []   [x]   []   []    Manual W/C (K0005) with power assist  []  NA  []   []   [x]   []   []   []   []   []   []   []   []    Scooter  [x]  NA  []   []   []   []   []   []   []   []   []   []   []    Power Wheelchair: standard joystick  []  NA  [x]   []   []   []   []   []   []   []   []   []   []    Power Wheelchair: alternative controls  [x]  NA  []   []   []   []   []   []   []   []   []   []   []    Summary:  The least costly alternative for independent functional mobility was found to be:    []  Crutch/Cane  []  Walker []  Manual w/c  []  Manual w/c with power assist   []  Scooter   [x]  Power w/c std joystick   []  Power w/c alternative control        []  Requires dependent care mobility device   Cabin crew for [employer] skills are adequate for safe mobility equipment operation  [x]   Yes []   No  Patient is willing and motivated to use recommended mobility equipment  [x]   Yes []   No       []  Patient is unable to safely operate mobility equipment independently and requires dependent care equipment Comments:           SENSATION and SKIN ISSUES:  Sensation []  Intact  []  Impaired []  Absent [x]  Hyposensate []  Hypersensate  []  Defensiveness  Location(s) of impairment: hyposensitive in L UE and L LE   Pressure Relief Method(s):  [x]  Lean side to side to offload (without risk of  falling)  [x]   W/C push up (4+ times/hour for 15+ seconds) [x]  Stand up (without risk of falling)    []  Other: (Describe): Effective pressure relief method(s) above can be performed consistently throughout the day: rYes  r No If not, Why?:  Skin Integrity Risk:       [x]  Low risk           []  Moderate risk            []  High risk  If high risk, explain:   Skin Issues/Skin Integrity  Current skin Issues  []  Yes [x]  No [x]  Intact  []   Red area   []   Open area  []  Scar tissue  []  At risk from prolonged sitting  Where: History of Skin Issues  []  Yes [x]  No Where : When: Stage: Hx of skin flap surgeries  []  Yes [x]  No Where:  When:  Pain: []  Yes [x]  No   Pain Location(s):  Intensity scale: (0-10) : How does pain interfere with mobility and/or MRADLs? -         MAT EVALUATION:  Neuro-Muscular Status: (Tone, Reflexive, Responses, etc.)     []   Intact   []  Spasticity:  [x]  Hypotonicity  []  Fluctuating  []  Muscle Spasms  []  Poor Righting Reactions/Poor Equilibrium Reactions  []  Primal Reflex(s):    Comments:            COMMENTS:    POSTURE:     Comments:  Pelvis Anterior/Posterior:  [x]  Neutral   []  Posterior  []  Anterior  []  Fixed - No movement []  Tendency away from neutral []  Flexible []  Self-correction []  External correction Obliquity (viewed from front)  [x]  WFL []  R Obliquity []  L Obliquity  []  Fixed - No movement []  Tendency away from neutral []  Flexible []  Self-correction []  External correction Rotation  [x]  WFL []  R anterior []  L anterior  []  Fixed - No movement []  Tendency away from neutral []  Flexible []  Self-correction []  External correction Tonal Influence Pelvis:  [x]  Normal []  Flaccid []  Low tone []  Spasticity []  Dystonia []  Pelvis thrust []  Other:    Trunk Anterior/Posterior:  [x]  WFL []  Thoracic kyphosis []  Lumbar lordosis  []  Fixed - No movement []  Tendency away from neutral [x]  Flexible [x]  Self-correction []   External correction  [x]  WFL []  Convex to left  []  Convex to right []  S-curve   []  C-curve []  Multiple curves []  Tendency away from neutral [x]  Flexible [x]  Self-correction []  External correction Rotation of shoulders and upper trunk:  []  Neutral []  Left-anterior []  Right- anterior []  Fixed- no movement []  Tendency away from neutral []  Flexible []  Self correction []  External correction Tonal influence Trunk:  [x]  Normal []  Flaccid []  Low tone []  Spasticity []  Dystonia []  Other:   Head & Neck  [x]  Functional []  Flexed    []  Extended []  Rotated right  []  Rotated left []  Laterally flexed right []  Laterally flexed left []  Cervical hyperextension   [x]  Good head control []  Adequate head control []  Limited head control []  Absent head control Describe tone/movement of head and neck:      Lower Extremity Measurements: LE ROM: R UE normal MMT, L UE flaccid with minimal movement with shoulder elevation  Active ROM Right 01/09/2023 Left 01/09/2023  Hip flexion    Hip extension    Hip abduction    Hip adduction    Knee flexion    Knee extension    Ankle dorsiflexion    Ankle plantarflexion     (  Blank rows = not tested)  LE MMT:  MMT Right 01/09/2023 Left 01/09/2023  Hip flexion    Hip extension    Hip abduction    Hip adduction    Knee flexion    Knee extension    Ankle dorsiflexion    Ankle plantarflexion     (Blank rows = not tested)  Hip positions:  [x]  Neutral   []  Abducted   []  Adducted  []  Subluxed   []  Dislocated   []  Fixed   []  Tendency away from neutral [x]  Flexible [x]  Self-correction []  External correction   Hip Windswept:[x]  Neutral  []  Right    []  Left  []  Subluxed   []  Dislocated   []  Fixed   []  Tendency away from neutral [x]  Flexible [x]  Self-correction []  External correction  LE Tone: []  Normal []  Low tone [x]  Spasticity in L LE (mild) []  Flaccid []  Dystonia []  Rocks/Extends at hip []  Thrust into knee extension []  Pushes  legs downward into footrest  Foot positioning: ROM Concerns: Dorsiflexed: []  Right   []  Left Plantar flexed: []  Right    []  Left Inversion: []  Right    []  Left Eversion: []  Right    []  Left  LE Edema: [x]  1+ (Barely detectable impression when finger is pressed into skin) []  2+ (slight indentation. 15 seconds to rebound) []  3+ (deeper indentation. 30 seconds to rebound) []  4+ (>30 seconds to rebound)  UE Measurements:  UPPER EXTREMITY ROM:   Active ROM Right 01/09/2023 Left 01/09/2023  Shoulder flexion    Shoulder abduction    Shoulder adduction    Elbow flexion    Elbow extension    Wrist flexion    Wrist extension    (Blank rows = not tested)  UPPER EXTREMITY MMT:  MMT Right 01/09/2023 Left 01/09/2023  Shoulder flexion    Shoulder abduction    Shoulder adduction    Elbow flexion    Elbow extension    Wrist flexion    Wrist extension    Pinch strength    Grip strength    (Blank rows = not tested)  Shoulder Posture:  Right Tendency towards Left  [x]   Functional []    []   Elevation []    []   Depression [x]    []   Protraction []    []   Retraction []    []   Internal rotation []    []   External rotation []    []   Subluxed [x]     UE Tone: []  Normal []  Flaccid [x]  Low tone L UE []  Spasticity  []  Dystonia []  Other:   UE Edema: [x]  1+ (Barely detectable impression when finger is pressed into skin) []  2+ (slight indentation. 15 seconds to rebound) []  3+ (deeper indentation. 30 seconds to rebound) []  4+ (>30 seconds to rebound)  Wrist/Hand: Handedness: [x]  Right   []  Left   []  NA: Comments:  Right  Left  [x]   WNL []    []   Limitations [x]    []   Contractures []    []   Fisting []    []   Tremors []    []   Weak grasp [x]    []   Poor dexterity [x]    []   Hand movement non functional [x]    []   Paralysis [x]         MOBILITY BASE RECOMMENDATIONS and JUSTIFICATION:  MOBILITY BASE  JUSTIFICATION   Manufacturer:   quandtum Edge Model:  Color:  Seat Width:  16 Seat Depth 20   []  Manual mobility base (continue below)   []  Scooter/POV  [x]  Power mobility base   Number of hours per day spent in above selected mobility base: 18+  Typical daily mobility base use Schedule: During the day   []  is not a safe, functional ambulator  []  limitation prevents from completing a MRADL(s) within a reasonable time frame    []  limitation places at high risk of morbidity or mortality secondary to  the attempts to perform a    MRADL(s)  []  limitation prevents accomplishing a MRADL(s) entirely  []  provide independent mobility  []  equipment is a lifetime medical need  []  walker or cane inadequate  []  any type manual wheelchair      inadequate  []  scooter/POV inadequate      []  requires dependent mobility          MANUAL MOBILITY      []  Standard manual wheelchair  K0001      Arm:    []  both []  right  []  left      Foot:   []  both []  right   []  left  []  self-propels wheelchair  []  will use on regular basis  []  chair fits throughout home  []  willing and motivated to use  []  propels with assistance     []  dependent use   []  Standard hemi-manual wheelchair  K0002      Arm:    []  both []  right  []  left      Foot:   []  both []  right   []  left  []  lower seat height required to foot propel  []  short stature  []  self-propels wheelchair  []  will use on regular basis  []  chair fits throughout home  []  willing and motivated to use   []  propels with assistance  []  dependent use   []  Lightweight manual wheelchair  K0003      Arm:    []  both []  right  []  left      Foot:   []  both  []  right  []  left                   []  hemi height required  []  medical condition and weight of  wheelchair affect ability to self      propel standard manual wheelchair in the residence  []  can and does self-propel (marginal propulsion skills)  []  daily use _________hours  []  chair fits throughout home  []  willing and motivated to use  []  lower seat  height required to foot propel  []  short stature   []  High strength lightweight manual  wheelchair (Breezy Ultra 4)  K0004     Arm:    []  both []  right  []  left     Foot:   []  both []  right   []  left                                                                  []  hemi height required []  medical condition and weight of wheelchair affect ability to self propel while engaging in frequent MRADL(s) that cannot be performed in a standard or lightweight manual wheelchair  []  daily use _________hours  []   chair fits throughout home  []  willing and motivated to use  []  prevent repetitive use injuries   []  lower seat height required to foot propel  []  short stature    []  Ultra-lightweight manual wheelchair  K0005     Arm:    []  both []  right  []  left     Foot:   []  both []  right  []  left       []  hemi height required  []  heavy duty    Front seat to floor _____ inches      Rear seat to floor _____ inches      Back height _____ inches     Back angle ______ degrees      Front angle _____ degrees  []   full-time manual wheelchair user  []  Requires individualized fitting and optimal adjustments for multiple features that include adjustable axle configuration, fully adjustable center of gravity, wheel camber, seat and back angle, angle of seat slope, which cannot be accommodated by a K0001 through K0004 manual wheelchair  []  prevent repetitive use injuries  []  daily use_________hours   []  user has high activity patterns that frequently require  them  to go out into the community for the purpose of independently accomplishing high level MRADL activities. Examples of these might include a combination of; shopping, work, school, Photographer, childcare, independently loading and unloading from a vehicle etc.  []  lower seat height required to foot propel  []  short stature  []  heavy duty -  weight over 250lbs   []  Current chair is a K0005   manufacture:___________________  model:_________________   serial#____________________  age:_________    []  First time [id number] user (complete trial)  K0004 time and # of strokes to propel 30 feet: ________seconds _________strokes  U0454 time and # of strokes to propel 30 feet: ________seconds _________strokes  What was the result of the trial between the K0004 and K0005 manual wheelchair? ___    What features of the K0005 w/c are needed as compared to the K0004 base? Why?___    []  adjustable seat and back angle changes the angle of seat slope of the frame to attain a gravity assisted position for efficient propulsion and proper weight distribution along the frame     []  the front of the wheelchair will be configured higher than the back of the chair to allow gravity to assist the user with postural stability  []  the center of the wheel will be positioned for stability, safety and efficient propulsion  []  adjustable axle allows for vertical, horizontal, camber and overall width changes  throughout the wheels for adjustment of the client's exact needs and abilities.   []  adjustable axle increases the stability and function of the chair allowing for adjustment of the center of gravity.   []  accommodates the client's anatomical position in the chair maximizing independence in mobility and maneuverability in all environments.   []  create a minimal fixed tilt-in space to assist in positioning.   []  Describe users full-time manual wheelchair activity patterns:___    []  Power assist Comments:  []  prevent repetitive use injuries  []  repetitive strain injury present in    shoulder girdle    []  shoulder pain is (> or =) to 7/10     during manual propulsion       Current Pain _____/10  []  requires conservation of energy to participate in MRADL(s) runable to propel up ramps or curbs using manual wheelchair  []  been K0005 user greater than one  year  []  user unwilling to use power      wheelchair (reason): []  less expensive option to power   wheelchair    []  rim activated power assist -      decreased strength   []  Heavy duty manual wheelchair       K0006     Arm:    []  both []  right  []  left     Foot:   []  both []  right  []  left     []  hemi height required    []  Dependent base  []  user exceeds 250lbs  []  non-functional ambulator    []  extreme spasticity  []  over active movement   []  broken frame/hx of repeated     repairs  []  able to self-propel in residence       []  lower seat to floor height required  []  unable to self-propel in residence   []  Extra heavy duty manual wheelchair  K0007     Arm:    []  both []  right  []  left     Foot:   []  both []  right  []  left     []  hemi height required  []  Dependent base  []  user exceeds 300lbs  []  non-functional ambulator    []  able to self-propel in residence   []  lower seat to floor height required  []  unable to self-propel in residence     []  Manual wheelchair with tilt [phone]      (Manual "Tilt-n-Space")  []  patient is dependent for transfers  []  patient requires frequent       positioning for pressure relief   []  patient requires frequent      positioning for poor/absent trunk control        []  Stroller Base  []  infant/child   []  unable to propel manual      wheelchair  []  allows for growth  []  non-functional ambulator  []  non-functional UE  []  independent mobility is not a goal at this time    MANUAL FRAME OPTIONS      Push handles  []  extended  rangle adjustable   []  standard  []  caregiver access  []  caregiver assist    []  allows "hooking" to enable      increased ability to perform ADLs or maintain balance   []  Angle Adjustable Back  []  postural control  []  control of tone/spasticity  []  accommodation of range of motion  []  UE functional control  []  accommodation for seating system    Rear wheel placement  []  std/fixed rfully adjustableramputee   []  camber ________degree  []  removable rear wheel  []  non-removable rear wheel  Wheel size _______  Wheel  style_______________________  []  improved UE access to wheels  []  increase propulsion ability  []  improved stability  []  changing angle in space for      improvement of postural stability  []  remove for transport    []  allow for seating system to fit on      base  []  amputee placement  []  1-arm drive access   r R  r L  []  enable propulsion of manual       wheelchair with one arm    []  amputee placement   Wheel rims/ Hand rims  []  Standard    []  Specialized-____ []  provide ability to propel manual   []  increase self-propulsion with hand wheelchair weakness/decreased grasp     []  Spoke protector/guard   []  prevent hands from  getting caught in spokes   Tires:  []  pneumatic  []  flat free inserts  []  solid  Style:  []  decrease roll resistance              []  prevent frequent flats  []  increase shock absorbency  []  decrease maintenance   []  decrease pain from road shock    []  decrease spasms from road shock    Wheel Locks:    []  push []  pull []  scissor  []  lock wheels for transfers  []  lock wheels from rolling   Brake/wheel lock extension:  []  R  []  L  []  allow user to operate wheel locks due to decreased reach or strength   Caster housing:  Caster size:                      Style:                                          []  suspension fork  []  maneuverability   []  stability of wheelchair   []  durability  []  maintenance  []  angle adjustment for posture  []  allow for feet to come under        wheelchair base  []  allows change in seat to floor      height   []  increase shock absorbency  []  decrease pain from road shock  []  decrease spasms from road    shock   []  Side guards  []  prevent clothing getting caught in wheel or becoming soiled  rprovide hip and pelvic stability  []  eliminates contact between body and wheels  []  limit hand contact with wheels   []  Anti-tippers      []  prevent wheelchair from tipping    backward  []  assist caregiver with curbs     POWER MOBILITY       []  Scooter/POV    []  can safely operate   []  can safely transfer   []  has adequate trunk stability   []  cannot functionally propel  manual wheelchair    [x]  Power mobility base    []  non-ambulatory   [x]  cannot functionally propel manual wheelchair   [x]  cannot functionally and safely      operate scooter/POV  [x]  can safely operate power       wheelchair  [x]  home is accessible  [x]  willing to use power wheelchair     Tilt  []  Powered tilt on powered chair  []  Powered tilt on manual chair  []  Manual tilt on manual chair Comments:  []  change position for pressure      []  elief/cannot weight shift   []  change position against      gravitational force on head and      shoulders   []  decrease pain  []  blood pressure management   []  control autonomic dysreflexia  []  decrease respiratory distress  []  management of spasticity  []  management of low tone  []  facilitate postural control   []  rest periods   []  control edema  []  increase sitting tolerance   []  aid with transfers     Recline   []  Power recline on power chair  []  Manual recline on manual chair  Comments:    []  intermittent catheterization  []  manage spasticity  []  accommodate femur to back angle  []  change position for pressure relief/cannot weight  shift rhigh risk of pressure sore development  []  tilt alone does not accomplish     effective pressure relief, maximum pressure relief achieved at -      _______ degrees tilt   _______ degrees recline   []  difficult to transfer to and from bed []  rest periods and sleeping in chair  []  repositioning for transfers  []  bring to full recline for ADL care  []  clothing/diaper changes in chair  []  gravity PEG tube feeding  []  head positioning  []  decrease pain  []  blood pressure management   []  control autonomic dysreflexia  []  decrease respiratory distress  []  user on ventilator     Elevator on mobility base  []  Power wheelchair  []  Scooter  []  increase Indep in  transfers   []  increase Indep in ADLs    []  bathroom function and safety  []  kitchen/cooking function and safety  []  shopping  []  raise height for communication at standing level  []  raise height for eye contact which reduces cervical neck strain and pain  []  drive at raised height for safety and navigating crowds  []  Other:   []  Vertical position system  (anterior tilt)     (Drive locks-out)    []  Stand       (Drive enabled)  []  independent weight bearing  []  decrease joint contractures  []  decrease/manage spasticity  []  decrease/manage spasms  []  pressure distribution away from   scapula, sacrum, coccyx, and ischial tuberosity  []  increase digestion and elimination   []  access to counters and cabinets  []  increase reach  []  increase interaction with others at eye level, reduces neck strain  []  increase performance of       MRADL(s)      Power elevating legrest    []  Center mount (Single) 85-170 degrees       []  Standard (Pair) 100-170 degrees  []  position legs at 90 degrees, not available with std power ELR  []  center mount tucks into chair to decrease turning radius in home, not available with std power ELR  []  provide change in position for LE  []  elevate legs during recline    []  maintain placement of feet on      footplate  []  decrease edema  []  improve circulation  []  actuator needed to elevate legrest  []  actuator needed to articulate legrest preventing knees from flexing  []  Increase ground clearance over      curbs  []   STD (pair) independently                     elevate legrest   POWER WHEELCHAIR CONTROLS      Controls/input device  []  Expandable  [x]  Non-expandable  []  Proportional  [x]  Right Hand []  Left Hand  []  Non-proportional/switches/head-array  []  Electrical/proximity         []   Mechanical      Manufacturer:___________________   Type:________________________ [x]  provides access for controlling wheelchair  [x]  programming for accurate control  []   progressive disease/changing condition  []  required for alternative drive      controls       []  lacks motor control to operate  proportional drive control  []  unable to understand proportional controls  []  limited movement/strength  []  extraneous movement / tremors / ataxic / spastic       []  Upgraded electronics controller/harness    []  Single power (tilt or recline)   []  Expandable    []   Non-expandable plus   []  Multi-power (tilt, recline, power legrest, power seat lift, vertical positioning system, stand)  []  allows input device to communicate with drive motors  []  harness provides necessary connections between the controller, input device, and seat functions     []  needed in order to operate power seat functions through joystick/ input device  []  required for alternative drive controls     []  Enhanced display  []  required to connect all alternative drive controls   []  required for upgraded joystick      (lite-throw, heavy duty, micro)  []  Allows user to see in which mode and drive the wheelchair is set; necessary for alternate controls       []  Upgraded tracking electronics  []  correct tracking when on uneven surfaces makes switch driving more efficient and less fatiguing  []  increase safety when driving  []  increase ability to traverse thresholds    []  Safety / reset / mode switches     Type:    []  Used to change modes and stop the wheelchair when driving     [x]  Mount for joystick / input device/switches  [x]  swing away for access or transfers   [x]  attaches joystick / input device / switches to wheelchair   [x]  provides for consistent access  []  midline for optimal placement    []  Attendant controlled joystick plus     mount  []  safety  []  long distance driving  []  operation of seat functions  []  compliance with transportation regulations    [x]  Battery NF22 x 2 [x]  required to power (power assist / scooter/ power wc / other):   []  Power inverter (24V to 12V)  []  required  for ventilator / respiratory equipment / other:     CHAIR OPTIONS MANUAL & POWER      Armrests   [x]  adjustable height []  removable  []  swing away []  fixed  [x]  flip back  []  reclining  [x]  full length pads []  desk []  tube arms []  gel pads  [x]  provide support with elbow at 90    [x]  remove/flip back/swing away for  transfers  [x]  provide support and positioning of upper body    [x]  allow to come closer to table top  [x]  remove for access to tables  []  provide support for w/c tray  [x]  change of height/angles for       variable activities   []  Elbow support / Elbow stop  []  keep elbow positioned on arm pad  []  keep arms from falling off arm pad  during tilt and/or recline   Upper Extremity Support  []  Arm trough  []   R  [x]   L  Style:  [x]  swivel mount []  fixed mount   []  posterior hand support  [x]   tray  []  full tray  []  joystick cut out  []   R  []   L  Style:  [x]  decrease gravitational pull on      shoulders  [x]  provide support to increase UE  function  [x]  provide hand support in natural    position  [x]  position flaccid UE  [x]  decrease subluxation    []  decrease edema       []  manage spasticity   []  provide midline positioning  [x]  provide work surface  []  placement for AAC/ Computer/ EADL       Hangers/ Legrests   []  ______ degree  []  Elevating []  articulating  []  swing away []  fixed []  lift off  []   heavy duty []  adjustable knee angle  []  adjustable calf panel   []  longer extension tube              []  provide LE support  []  maintain placement of feet on      footplate   []  accommodate lower leg length  []  accommodate to hamstring       tightness  []  enable transfers  []  provide change in position for LE's  []  elevate legs during recline    []  decrease edema  []  durability      Foot support   [x]  footplate []  R []  L [x]  flip up           [x]  Depth adjustable   []  angle adjustable  []  foot board/one piece    [x]  provide foot support  [x]  accommodate to  ankle ROM  [x]  allow foot to go under wheelchair base  [x]  enable transfers     []  Shoe holders  []  position foot    []  decrease / manage spasticity  []  control position of LE  []  stability    []  safety     []  Ankle strap/heel      loops  []  support foot on foot support  []  decrease extraneous movement  []  provide input to heel   []  protect foot     []  Amputee adapter []  R  []  L     Style:                  Size:  []  Provide support for stump/residual extremity    []  Transportation tie-down  []  to provide crash tested tie-down brackets    []  Crutch/cane holder    []  O2 holder    []  IV hanger   []  Ventilator tray/mount    []  stabilize accessory on wheelchair       Component  Justification     [x]  Seat cushion   Captain seat    []  accommodate impaired sensation  []  decubitus ulcers present or history  []  unable to shift weight  []  increase pressure distribution  []  prevent pelvic extension  []  custom required "off-the-shelf"    seat cushion will not accommodate deformity  [x]  stabilize/promote pelvis alignment  [x]  stabilize/promote femur alignment  []  accommodate obliquity  []  accommodate multiple deformity  []  incontinent/accidents  [x]  low maintenance     []  seat mounts                 []  fixed []  removable  []  attach seat platform/cushion to wheelchair frame    []  Seat wedge    []  provide increased aggressiveness of seat shape to decrease sliding  down in the seat  []  accommodate ROM        []  Cover replacement   []  protect back or seat cushion  []  incontinent/accidents    []  Solid seat / insert    []  support cushion to prevent      hammocking  []  allows attachment of cushion to mobility base    []  Lateral pelvic/thigh/hip     support (Guides)     []  decrease abduction  []  accommodate pelvis  []  position upper legs  []  accommodate spasticity  []  removable for transfers     []  Lateral pelvic/thigh      supports mounts  []  fixed   []  swing-away   []  removable  []   mounts lateral pelvic/thigh supports     []  mounts lateral pelvic/thigh supports  swing-away or removable for transfers    []  Medial thigh support ([place])  [] decrease adduction  [] accommodate ROM  []  remove for transfers   []  alignment      []  Medial thigh   []  fixed      support mounts      []  swing-away   []  removable  []  mounts medial thigh supports   []  Mounts medial supports swing- away or removable for transfers       Component  Justification   [x]  Back       [x]  provide posterior trunk support []  facilitate tone  []  provide lumbar/sacral support []  accommodate deformity  []  support trunk in midline   []  custom required "off-the-shelf" back support will not accommodate deformity   []  provide lateral trunk support []  accommodate or decrease tone            []  Back mounts  []  fixed  []  removable  []  attach back rest/cushion to wheelchair frame   []  Lateral trunk      supports  []  R []  L  []  decrease lateral trunk leaning  []  accommodate asymmetry    []  contour for increased contact  []  safety    []  control of tone    []  Lateral trunk      supports mounts  []  fixed  []  swing-away   []  removable  []  mounts lateral trunk supports     []  Mounts lateral trunk supports swing-away or removable for transfers   []  Anterior chest      strap, vest     []  decrease forward movement of shoulder  []  decrease forward movement of trunk  []  safety/stability  []  added abdominal support  []  trunk alignment  []  assistance with shoulder control   []  decrease shoulder elevation    []  Headrest      []  provide posterior head support  []  provide posterior neck support  []  provide lateral head support  []  provide anterior head support  []  support during tilt and recline  []  improve feeding     []  improve respiration  []  placement of switches  []  safety    []  accommodate ROM   []  accommodate tone  []  improve visual orientation   []  Headrest           []  fixed []  removable []  flip down       Mounting hardware   []  swing-away laterals/switches  []  mount headrest   []  mounts headrest flip down or  removable for transfers  []  mount headrest swing-away laterals   []  mount switches     []  Neck Support    []  decrease neck rotation  []  decrease forward neck flexion   Pelvic Positioner    [x]  std hip belt          []  padded hip belt  []  dual pull hip belt  []  four point hip belt  []  stabilize tone  [x]  decrease falling out of chair  []  prevent excessive extension  []  special pull angle to control      rotation  []  pad for protection over boney   prominence  []  promote comfort    []  Essential needs        bag/pouch   []  medicines []  special food rorthotics []  clothing changes  []  diapers  []  catheter/hygiene []  ostomy supplies   The above equipment has a life- long use expectancy.  Growth and changes in medical and/or functional conditions would be  the exceptions.   SUMMARY:  Why mobility device was selected; include why a lower level device is not appropriate:   ASSESSMENT:  Functional tests: 10 meter walk test: 107 sec with large base quad cane = 0.09 m/s 10 meter wheelchair propulsion: 55 sec with use of R UE and R LE = 0.18 m/s  CLINICAL IMPRESSION: Patient is a 67 y.o. male who was seen today for physical therapy evaluation and treatment for wheelchair evaluation. Patient has significant mobility issues with use of his gait and manual wheelchair. Pt is not appropriate to use large based quad cane due to significantly reduced gait speeds which puts him at high risk for fall. Patient is very cautious and not falling at home due to him taking time with functional tasks and mostly using manual wheelchair to proper around the house. Patient is reporting difficulty with use of manual wheelchair due to increased time it takes to get from one place of the house to the other and difficulty navigating with turns and overall maneuverability within his house. Patient's wheelchair  propulsion speed with manual wheelchair is significantly slower compared to comfortable wheelchair propulsion speed of 1.5-1.65 m/s Caralee Ates et al, 2022). Patient will benefit from group 3 power chair with to improve functional mobility within the house to get to bathroom in time, improve independence with mobility when going to his medical appointments, and overall independence to reduce caregiver dependence.   Ronnell Freshwater, W. (2022). Influence of wheelchair type on wheelchair propulsion test performance in community dwelling, adult wheelchair users. Neuro Rehabilitation, 50(4), [id number].   OBJECTIVE IMPAIRMENTS Abnormal gait, decreased activity tolerance, decreased balance, decreased endurance, decreased mobility, difficulty walking, decreased ROM, decreased strength, hypomobility, impaired flexibility, impaired sensation, impaired UE functional use, and postural dysfunction.   ACTIVITY LIMITATIONS carrying, lifting, bending, standing, squatting, stairs, transfers, bathing, toileting, dressing, hygiene/grooming, and locomotion level  PARTICIPATION LIMITATIONS: meal prep, cleaning, laundry, driving, shopping, community activity, and yard work  PERSONAL FACTORS Past/current experiences and Time since onset of injury/illness/exacerbation are also affecting patient's functional outcome.   REHAB POTENTIAL: Good  CLINICAL DECISION MAKING: Stable/uncomplicated  EVALUATION COMPLEXITY: Low                                   GOALS: One time visit. No goals established.    PLAN: PT FREQUENCY: one time visit    Ileana Ladd, PT 01/09/2023, 6:56 PM    I concur with the above findings and recommendations of the therapist:  Physician name printed:         Physician's signature:      Date:

## 2023-01-25 ENCOUNTER — Telehealth: Payer: Self-pay | Admitting: *Deleted

## 2023-01-25 DIAGNOSIS — I69398 Other sequelae of cerebral infarction: Secondary | ICD-10-CM

## 2023-01-25 DIAGNOSIS — G8110 Spastic hemiplegia affecting unspecified side: Secondary | ICD-10-CM

## 2023-01-25 NOTE — Telephone Encounter (Signed)
Mrs Ketner called and said that Mr Roudebush needs a new quad cane. He saw the outpt neuro rehab yesterday.

## 2023-01-27 NOTE — Telephone Encounter (Signed)
Ordered

## 2023-03-14 NOTE — Patient Instructions (Signed)
Below is our plan:  We will continue levetiracetam 500mg  twice daily. Continue close follow up with Dr Wynn Banker for wheelchair evaluation. Continue close follow up with PCP and pravastatin for stroke prevention.   Please make sure you are consistent with timing of seizure medication. I recommend annual visit with primary care provider (PCP) for complete physical and routine blood work. I recommend daily intake of vitamin D (400-800iu) and calcium (800-1000mg ) for bone health. Discuss Dexa screening with PCP.   According to [place] law, you can not drive unless you are seizure / syncope free for at least 6 months and under physician's care.  Please maintain precautions. Do not participate in activities where a loss of awareness could harm you or someone else. No swimming alone, no tub bathing, no hot tubs, no driving, no operating motorized vehicles (cars, ATVs, motocycles, etc), lawnmowers, power tools or firearms. No standing at heights, such as rooftops, ladders or stairs. Avoid hot objects such as stoves, heaters, open fires. Wear a helmet when riding a bicycle, scooter, skateboard, etc. and avoid areas of traffic. Set your water heater to 120 degrees or less.  SUDEP is the sudden, unexpected death of someone with epilepsy, who was otherwise healthy. In SUDEP cases, no other cause of death is found when an autopsy is done. Each year, more than 1 in 1,000 people with epilepsy die from SUDEP. This is the leading cause of death in people with uncontrolled seizures. Until further answers are available, the best way to prevent SUDEP is to lower your risk by controlling seizures. Research has found that people with all types of epilepsy that experience convulsive seizures can be at risk.  Please make sure you are staying well hydrated. I recommend 50-60 ounces daily. Well balanced diet and regular exercise encouraged. Consistent sleep schedule with 6-8 hours recommended.   Please continue follow up with care  team as directed.   Follow up with me in 1 year   You may receive a survey regarding today's visit. I encourage you to leave honest feed back as I do use this information to improve patient care. Thank you for seeing me today!

## 2023-03-14 NOTE — Progress Notes (Unsigned)
PATIENT: Jon [name] DOB: [birth year]  REASON FOR VISIT: follow up HISTORY FROM: patient  No chief complaint on file.    HISTORY OF PRESENT ILLNESS:  03/14/23 ALL: Court returns for follow up for seizures. He continues lev 500mg  BID.   03/14/2022 ALL: Jon [name] returns for follow up for seizures. He was last seen by Dr Marjory Lies and doing well. He continues levetiracetam 500mg  BID. No recent seizure. He continues close follow up with Dr Nicholos Johns for co morbidity management and stroke prevention. He continues pravastatin. BP is usually well managed.   03/04/2020 ALL: Jon [name] is a 67 y.o. male here today for follow up for seizure. He is s/p ICH in 2017 resulting in left hemiplegia. He continues levetiracetam 500mg  BID. He is tolerating medication well and denies seizure activity. He does have a history of depression but states that it has not worsened. He is followed by Dr Nicholos Johns, PCP and continues sertraline and quetiapine. He is also seen regularly by [facility], Rehab. He continues diazepam, prescribed by rehab, for spasticity. He is also followed by Dr Kieth Brightly, neuropsychology. He is able to walk at home but uses wheelchair for long distance traveling.   HISTORY: (copied from Dr Richrd Humbles note on 07/05/2019)  67 year old male with history of right basal ganglia intracerebral hemorrhage in 2017, here for evaluation of seizure.  Patient had new onset seizure on 05/03/2019, early in the morning when he was sleeping.  Patient started shaking and this woke up his wife.  She called EMS who arrived on scene.  Patient was confused and combative.  Patient went to the hospital for evaluation and treatment.  Patient was transferred to ICU due to agitation and need for IV sedation.  No electrographic seizures on EEG.  Spinal tap ruled out CNS infection.  Patient had somewhat prolonged postictal state but gradually returned to baseline.  He was discharged on levetiracetam 500 mg twice a day.    Since that time he is doing well.  No further seizures.  He is gradually returning to himself.  He was having some depression initially which is improving.   REVIEW OF SYSTEMS: Out of a complete 14 system review of symptoms, the patient complains only of the following symptoms, gait impairment, left sided weakness, depression, anxiety, and all other reviewed systems are negative.  ALLERGIES: Allergies  Allergen Reactions   Gluten Meal Nausea And Vomiting   Lactose Intolerance (Gi) Nausea And Vomiting   Other     Tree nuts    Shellfish Allergy Swelling and Rash    HOME MEDICATIONS: Outpatient Medications Prior to Visit  Medication Sig Dispense Refill   atorvastatin (LIPITOR) 20 MG tablet Take 20 mg by mouth daily.     diazepam (VALIUM) 2 MG tablet TAKE ONE TABLET BY MOUTH AT BEDTIME 90 tablet 0   fluticasone (FLONASE) 50 MCG/ACT nasal spray Place 1 spray into both nostrils daily. (Patient taking differently: Place 1 spray into both nostrils daily as needed.) 1 g 2   folic acid (FOLVITE) 1 MG tablet Take 1 tablet (1 mg total) by mouth daily. (Patient taking differently: Take 1 mg by mouth 3 (three) times daily.) 30 tablet 0   folic acid (FOLVITE) 400 MCG tablet Take 400 mcg by mouth daily.     levETIRAcetam (KEPPRA) 500 MG tablet Take 1 tablet (500 mg total) by mouth 2 (two) times daily. 180 tablet 4   metoprolol tartrate (LOPRESSOR) 25 MG tablet Take 1 tablet (25 mg total) by mouth  2 (two) times daily. 60 tablet 1   Multiple Vitamin (MULTIVITAMIN WITH MINERALS) TABS tablet Take 1 tablet by mouth daily.     pravastatin (PRAVACHOL) 40 MG tablet Take 1 tablet (40 mg total) by mouth daily at 6 PM. 30 tablet 1   QUEtiapine (SEROQUEL) 25 MG tablet Take 0.5 tablets (12.5 mg total) by mouth at bedtime. 30 tablet 1   sertraline (ZOLOFT) 50 MG tablet Take 1 tablet (50 mg total) by mouth daily. (Patient taking differently: Take 50 mg by mouth at bedtime.) 30 tablet 1   solifenacin (VESICARE) 5 MG  tablet Take 5 mg by mouth daily.     Solifenacin Succinate (VESICARE PO) Take 5 mg by mouth daily. Taking once daily Mg unknown     tadalafil (CIALIS) 5 MG tablet Take 5 mg by mouth daily.      tamsulosin (FLOMAX) 0.4 MG CAPS capsule      No facility-administered medications prior to visit.    PAST MEDICAL HISTORY: Past Medical History:  Diagnosis Date   Bladder disorder 10/08/2001   Depression    Hypertension    Liver damage    Seizures (HCC)    none since 2018   Stroke [facility])     PAST SURGICAL HISTORY: Past Surgical History:  Procedure Laterality Date   CATARACT EXTRACTION Bilateral    [facility] - removal & lense implants   TONSILLECTOMY AND ADENOIDECTOMY Bilateral    Age 82    FAMILY HISTORY: No family history on file.  SOCIAL HISTORY: Social History   Socioeconomic History   Marital status: Married    Spouse name: [name]   Number of children: Not on file   Years of education: Not on file   Highest education level: Not on file  Occupational History   Not on file  Tobacco Use   Smoking status: Former    Current packs/day: 0.00    Average packs/day: 1 pack/day for 23.0 years (23.0 ttl pk-yrs)    Types: Cigarettes    Start date: 01/07/1993    Quit date: 01/08/2016    Years since quitting: 7.1   Smokeless tobacco: Never  Vaping Use   Vaping status: Never Used  Substance and Sexual Activity   Alcohol use: Not Currently    Alcohol/week: 12.0 standard drinks of alcohol    Types: 12 Glasses of wine per week    Comment: Wife reports chronic alcohol use. 01/12/16   Drug use: No    Types: Marijuana    Comment: teens to early 20's   Sexual activity: Not on file  Other Topics Concern   Not on file  Social History Narrative   Lives home, retired.  Married.    Social Determinants of Health   Financial Resource Strain: Not on file  Food Insecurity: Not on file  Transportation Needs: Not on file  Physical Activity: Not on file  Stress: Not on file   Social Connections: Not on file  Intimate Partner Violence: Not on file      PHYSICAL EXAM  There were no vitals filed for this visit.   There is no height or weight on file to calculate BMI.  Generalized: Well developed, in no acute distress  Cardiology: normal rate and rhythm, no murmur noted Respiratory: clear to auscultation bilaterally  Neurological examination  Mentation: Alert oriented to time, place, history taking. Follows all commands speech and language fluent Cranial nerve II-XII: Pupils were equal round reactive to light. Extraocular movements were full, visual field  were full on confrontational test. Facial sensation and strength were normal. Uvula tongue midline. Head turning and shoulder shrug  were normal and symmetric. Motor: The motor testing reveals 5 over 5 strength of right upper and lower extremity, left upper 2/5, left lower 3/5.  Sensory: Sensory testing is intact to soft touch on all 4 extremities. No evidence of extinction is noted.  Coordination: Cerebellar testing reveals good finger-nose-finger and heel-to-shin on right, unable to perform on left Gait and station: able to push to standing position with right UE. Hemiplegic gait. Stable with four prong cane.  Reflexes: Deep tendon reflexes are symmetric and normal bilaterally.   DIAGNOSTIC DATA (LABS, IMAGING, TESTING) - I reviewed patient records, labs, notes, testing and imaging myself where available.      No data to display           Lab Results  Component Value Date   WBC 8.2 05/06/2019   HGB 13.3 05/06/2019   HCT 38.1 (L) 05/06/2019   MCV 90.3 05/06/2019   PLT 123 (L) 05/06/2019      Component Value Date/Time   NA 139 05/07/2019 0356   K 3.9 05/07/2019 0356   CL 104 05/07/2019 0356   CO2 21 (L) 05/07/2019 0356   GLUCOSE 64 (L) 05/07/2019 0356   BUN 5 (L) 05/07/2019 0356   CREATININE 0.66 05/07/2019 0356   CALCIUM 8.6 (L) 05/07/2019 0356   PROT 7.3 05/03/2019 0707   ALBUMIN 4.2  05/03/2019 0707   AST 22 05/03/2019 0707   ALT 14 05/03/2019 0707   ALKPHOS 53 05/03/2019 0707   BILITOT 1.1 05/03/2019 0707   GFRNONAA >60 05/07/2019 0356   GFRAA >60 05/07/2019 0356   Lab Results  Component Value Date   CHOL 250 (H) 01/09/2016   HDL 51 01/09/2016   LDLCALC 157 (H) 01/09/2016   TRIG 212 (H) 01/09/2016   CHOLHDL 4.9 01/09/2016   Lab Results  Component Value Date   HGBA1C 5.5 01/09/2016   Lab Results  Component Value Date   VITAMINB12 449 01/09/2016   Lab Results  Component Value Date   TSH 3.682 05/03/2019       ASSESSMENT AND PLAN 67 y.o. year old male  has a past medical history of Bladder disorder (10/08/2001), Depression, Hypertension, Liver damage, Seizures (HCC), and Stroke (HCC). here with   No diagnosis found.    Jon [name] is doing well, today. We will continue levetiracetam 500mg  twice daily. He will continue close follow up with PCP for depression, insomnia and stroke prevention. He will continue follow up with Dr Wynn Banker and Dr Kieth Brightly as advised. Healthy lifestyle habits encouraged. He will follow up with [place] in 1 year, sooner if needed.    No orders of the defined types were placed in this encounter.    No orders of the defined types were placed in this encounter.     Shawnie Dapper, FNP-C 03/14/2023, 4:45 PM [facility] [address] [place], [place] [address] [phone]

## 2023-03-15 ENCOUNTER — Ambulatory Visit: Payer: Medicare HMO | Admitting: Family Medicine

## 2023-03-15 ENCOUNTER — Encounter: Payer: Self-pay | Admitting: Family Medicine

## 2023-03-15 VITALS — BP 112/68 | HR 55 | Ht 70.0 in | Wt 149.0 lb

## 2023-03-15 DIAGNOSIS — G40909 Epilepsy, unspecified, not intractable, without status epilepticus: Secondary | ICD-10-CM

## 2023-03-15 MED ORDER — LEVETIRACETAM 500 MG PO TABS: 500.0000 mg | ORAL_TABLET | Freq: Two times a day (BID) | ORAL | 4 refills | Status: DC

## 2023-03-16 ENCOUNTER — Telehealth: Payer: Self-pay | Admitting: *Deleted

## 2023-03-16 NOTE — Telephone Encounter (Signed)
Jon [name] called and says that she has called PT and [employer] and they do not have the forms that were filled out by [place] to get the wheelchair. I have let Riley Lam and April know. They were filled out and faxed 03/07/23 but [employer] says they do not have them. April will fax again.

## 2023-05-05 ENCOUNTER — Encounter: Payer: Self-pay | Admitting: Acute Care

## 2023-05-12 DIAGNOSIS — Z23 Encounter for immunization: Secondary | ICD-10-CM | POA: Diagnosis not present

## 2023-05-12 DIAGNOSIS — I10 Essential (primary) hypertension: Secondary | ICD-10-CM | POA: Diagnosis not present

## 2023-05-12 DIAGNOSIS — F3342 Major depressive disorder, recurrent, in full remission: Secondary | ICD-10-CM | POA: Diagnosis not present

## 2023-05-12 DIAGNOSIS — I700 Atherosclerosis of aorta: Secondary | ICD-10-CM | POA: Diagnosis not present

## 2023-05-12 DIAGNOSIS — N400 Enlarged prostate without lower urinary tract symptoms: Secondary | ICD-10-CM | POA: Diagnosis not present

## 2023-05-12 DIAGNOSIS — Z0000 Encounter for general adult medical examination without abnormal findings: Secondary | ICD-10-CM | POA: Diagnosis not present

## 2023-05-12 DIAGNOSIS — E782 Mixed hyperlipidemia: Secondary | ICD-10-CM | POA: Diagnosis not present

## 2023-05-12 DIAGNOSIS — G40909 Epilepsy, unspecified, not intractable, without status epilepticus: Secondary | ICD-10-CM | POA: Diagnosis not present

## 2023-05-12 DIAGNOSIS — I69354 Hemiplegia and hemiparesis following cerebral infarction affecting left non-dominant side: Secondary | ICD-10-CM | POA: Diagnosis not present

## 2023-05-12 DIAGNOSIS — Z125 Encounter for screening for malignant neoplasm of prostate: Secondary | ICD-10-CM | POA: Diagnosis not present

## 2023-05-12 DIAGNOSIS — D696 Thrombocytopenia, unspecified: Secondary | ICD-10-CM | POA: Diagnosis not present

## 2023-05-31 ENCOUNTER — Ambulatory Visit
Admission: RE | Admit: 2023-05-31 | Discharge: 2023-05-31 | Disposition: A | Discharge status: Home / Self Care | Payer: Medicare HMO | Source: Ambulatory Visit | Attending: Acute Care | Admitting: Acute Care

## 2023-05-31 DIAGNOSIS — Z87891 Personal history of nicotine dependence: Secondary | ICD-10-CM | POA: Diagnosis not present

## 2023-05-31 DIAGNOSIS — I700 Atherosclerosis of aorta: Secondary | ICD-10-CM | POA: Diagnosis not present

## 2023-05-31 DIAGNOSIS — Z122 Encounter for screening for malignant neoplasm of respiratory organs: Secondary | ICD-10-CM

## 2023-06-01 DIAGNOSIS — Z1211 Encounter for screening for malignant neoplasm of colon: Secondary | ICD-10-CM | POA: Diagnosis not present

## 2023-06-01 DIAGNOSIS — Z1212 Encounter for screening for malignant neoplasm of rectum: Secondary | ICD-10-CM | POA: Diagnosis not present

## 2023-06-09 LAB — EXTERNAL GENERIC LAB PROCEDURE: COLOGUARD: NEGATIVE

## 2023-06-09 LAB — COLOGUARD: COLOGUARD: NEGATIVE

## 2023-06-19 DIAGNOSIS — G8110 Spastic hemiplegia affecting unspecified side: Secondary | ICD-10-CM | POA: Diagnosis not present

## 2023-06-28 ENCOUNTER — Other Ambulatory Visit: Payer: Self-pay | Admitting: Acute Care

## 2023-06-28 DIAGNOSIS — Z87891 Personal history of nicotine dependence: Secondary | ICD-10-CM

## 2023-06-28 DIAGNOSIS — Z122 Encounter for screening for malignant neoplasm of respiratory organs: Secondary | ICD-10-CM

## 2023-09-05 ENCOUNTER — Encounter: Payer: PPO | Attending: Registered Nurse | Admitting: Physical Medicine & Rehabilitation

## 2023-09-05 ENCOUNTER — Ambulatory Visit: Payer: Medicare HMO | Admitting: Registered Nurse

## 2023-09-05 ENCOUNTER — Encounter: Payer: Self-pay | Admitting: Physical Medicine & Rehabilitation

## 2023-09-05 VITALS — BP 118/69 | HR 99 | Ht 70.0 in | Wt 159.0 lb

## 2023-09-05 DIAGNOSIS — I69398 Other sequelae of cerebral infarction: Secondary | ICD-10-CM | POA: Insufficient documentation

## 2023-09-05 DIAGNOSIS — I619 Nontraumatic intracerebral hemorrhage, unspecified: Secondary | ICD-10-CM | POA: Insufficient documentation

## 2023-09-05 DIAGNOSIS — G8110 Spastic hemiplegia affecting unspecified side: Secondary | ICD-10-CM | POA: Diagnosis not present

## 2023-09-05 DIAGNOSIS — R269 Unspecified abnormalities of gait and mobility: Secondary | ICD-10-CM | POA: Insufficient documentation

## 2023-09-05 NOTE — Progress Notes (Signed)
Subjective:    Patient ID: Jon [name], male    DOB: Oct 15, 1955, 68 y.o.   MRN: [id number]  HPI 68 year old male Hx of Right putaminal bleed June 2017, went through the inpatient rehab program at [facility] and followed in clinic since that time.  He was last seen approximately 1 year ago.  Has had no hospitalizations or surgeries since that time. Has power chair at home but has no ramps  Also has a lighter power chair which is portable and [place]    Patient has had some left-sided neck pain.  He states that when he gets his pillow suggested just right that pain does not bother him at night but otherwise can have some issues when he is trying to sleep.  He has had no falls or trauma to the area.  He has no history of neck surgery.  He has chronic left-sided sensory deficits related to his CVA  Hx of post CVA seizure d/o, but no seizure recently  No falls   MRI HEAD WITHOUT CONTRAST   TECHNIQUE: Multiplanar, multiecho pulse sequences of the brain and surrounding structures were obtained without intravenous contrast.   COMPARISON:  Head CT 05/03/2019 and MRI 05/25/2016   FINDINGS: Brain: No acute infarct, mass, midline shift, or extra-axial fluid collection is identified. Encephalomalacia and hemosiderin staining are again noted in the right basal ganglia and adjacent white matter related to a remote hemorrhage. There is associated ex vacuo dilatation of the right lateral ventricle and wallerian degeneration in the right midbrain. The left cerebral hemisphere is normal in signal, as is the cerebellum. There is mild global cerebral atrophy. The right hippocampus is mildly smaller than the left without signal abnormality.   Vascular: Major intracranial vascular flow voids are preserved.   Skull and upper cervical spine: Unremarkable bone marrow signal.   Sinuses/Orbits: Unremarkable orbits. Small right maxillary sinus mucous retention cysts. Small  bilateral mastoid effusions.   Other: None.   IMPRESSION: 1. No acute intracranial abnormality. 2. Encephalomalacia related to a remote right basal ganglia hemorrhage.     Electronically Signed   By: Sebastian Ache M.D.   On: 05/04/2019 13:26   Pain Inventory Average Pain 1 Pain Right Now 0 My pain is intermittent and dull  LOCATION OF PAIN  neck, right upper chest & right back pain  BOWEL Number of stools per week: 7 Oral laxative use No  History of colostomy No    BLADDER Normal taking Flomax and its working    Mobility walk without assistance use a cane how many minutes can you walk? 5 minutes ability to climb steps?  yes do you drive?  no use a wheelchair transfers alone  Function retired I need assistance with the following:  meal prep, household duties, shopping, and wife helps at home Do you have any goals in this area?  yes  Neuro/Psych weakness numbness tremor tingling trouble walking spasms dizziness confusion  Prior Studies Any changes since last visit?  no  Physicians involved in your care Any changes since last visit?  no   History reviewed. No pertinent family history. Social History   Socioeconomic History   Marital status: Married    Spouse name: [name]   Number of children: Not on file   Years of education: Not on file   Highest education level: Not on file  Occupational History   Not on file  Tobacco Use   Smoking status: Former  Current packs/day: 0.00    Average packs/day: 1 pack/day for 23.0 years (23.0 ttl pk-yrs)    Types: Cigarettes    Start date: 01/07/1993    Quit date: 01/08/2016    Years since quitting: 7.6   Smokeless tobacco: Never  Vaping Use   Vaping status: Never Used  Substance and Sexual Activity   Alcohol use: Not Currently    Alcohol/week: 12.0 standard drinks of alcohol    Types: 12 Glasses of wine per week    Comment: Wife reports chronic alcohol use. 01/12/16   Drug use: No    Types:  Marijuana    Comment: teens to early 20's   Sexual activity: Not on file  Other Topics Concern   Not on file  Social History Narrative   Lives home, retired.  Married.    Social Drivers of Corporate investment banker Strain: Not on file  Food Insecurity: Not on file  Transportation Needs: Not on file  Physical Activity: Not on file  Stress: Not on file  Social Connections: Not on file   Past Surgical History:  Procedure Laterality Date   CATARACT EXTRACTION Bilateral    [facility] - removal & lense implants   TONSILLECTOMY AND ADENOIDECTOMY Bilateral    Age 23   Past Medical History:  Diagnosis Date   Bladder disorder 10/08/2001   Depression    Hypertension    Liver damage    Seizures (HCC)    none since 2018   Stroke (HCC)    Ht 5\' 10"  (1.778 m)   Wt 159 lb (72.1 kg)   BMI 22.81 kg/m   Opioid Risk Score:   Fall Risk Score:  `1  Depression screen PHQ 2/9     10/13/2022    2:13 PM 09/16/2022    1:37 PM 09/16/2021    1:41 PM 03/19/2020    3:01 PM 12/03/2019   12:07 PM 01/01/2019    1:22 PM 01/01/2018   11:56 AM  Depression screen PHQ 2/9  Decreased Interest 1 0 0 0 0 0 0  Down, Depressed, Hopeless 1 0 0 0 0 0 0  PHQ - 2 Score 2 0 0 0 0 0 0    Review of Systems  Musculoskeletal:        Spasms  Neurological:  Positive for dizziness, tremors, weakness and numbness.       Tingling  Psychiatric/Behavioral:  Positive for confusion.   All other systems reviewed and are negative.      Objective:   Physical Exam  Sensation diminished over light touch in the left facial left upper and left lower limb.  He also has dysesthetic pain when rubbing the arm. There is no swelling in the left upper extremity. Tone is MAS 2 in the finger flexors MAS 3 in the wrist flexors MAS 1 in the elbow flexors on the left side. Motor strength is 2 - shoulder abduction trace elbow flexion 0 at the fingers and wrist. Left lower limb 4 - at the hip flexor and knee extensor and 3  - ankle dorsiflexor Ambulates for short distances with 4 point cane.  He has left foot drag he does have some compensatory hip hiking as well as external rotation at the hip to clear the left foot. Neck has 50% range of motion with flexion extension lateral bending and rotation. There is some tightness at the left trap as well as levator scapular area less so in the suboccipital area as well as  cervical paraspinal      Assessment & Plan:   1.  Remote right basal ganglia hemorrhage with chronic left-sided hemisensory deficits and left upper extremity greater than left lower extremity weakness.  Overall he is functioning at a supervision level for mobility and ADLs.  His wife is very helpful at home. Have encouraged patient continue with stretching on his own.  No formal therapy needed at this time. 2.  Poststroke seizure disorder follow-up with neurology he is on Keppra 3.  Left-sided neck pain we discussed that this could be some combination between cervical dystonia poststroke as well as likely cervical spondylosis and degenerative disc.  We discussed getting an x-ray we discussed trial of botulinum toxin.  He would like to hold off on this for now unless this worsens.  He will call us if it does. Physical medicine rehab follow-up in 1 year

## 2023-09-05 NOTE — Patient Instructions (Signed)
Please call if you'd like me to order Xray of cervical spine  You may benefit from botox injection of the trapezius and levator scapulae muscles - please call if you'd like to schedule

## 2023-11-21 DIAGNOSIS — G8110 Spastic hemiplegia affecting unspecified side: Secondary | ICD-10-CM | POA: Diagnosis not present

## 2023-11-21 DIAGNOSIS — H938X2 Other specified disorders of left ear: Secondary | ICD-10-CM | POA: Diagnosis not present

## 2023-11-21 DIAGNOSIS — I10 Essential (primary) hypertension: Secondary | ICD-10-CM | POA: Diagnosis not present

## 2023-11-21 DIAGNOSIS — I69359 Hemiplegia and hemiparesis following cerebral infarction affecting unspecified side: Secondary | ICD-10-CM | POA: Diagnosis not present

## 2023-11-21 DIAGNOSIS — E782 Mixed hyperlipidemia: Secondary | ICD-10-CM | POA: Diagnosis not present

## 2024-01-18 DIAGNOSIS — H524 Presbyopia: Secondary | ICD-10-CM | POA: Diagnosis not present

## 2024-01-18 DIAGNOSIS — H5340 Unspecified visual field defects: Secondary | ICD-10-CM | POA: Diagnosis not present

## 2024-01-18 DIAGNOSIS — Z961 Presence of intraocular lens: Secondary | ICD-10-CM | POA: Diagnosis not present

## 2024-01-19 ENCOUNTER — Telehealth: Payer: Self-pay

## 2024-01-19 DIAGNOSIS — G8194 Hemiplegia, unspecified affecting left nondominant side: Secondary | ICD-10-CM

## 2024-01-19 NOTE — Telephone Encounter (Signed)
 Pt requested OT with Sarah through [place] to get his left arm moving again

## 2024-01-25 ENCOUNTER — Encounter: Admitting: Occupational Therapy

## 2024-02-28 ENCOUNTER — Ambulatory Visit: Attending: Physical Medicine & Rehabilitation | Admitting: Occupational Therapy

## 2024-02-28 ENCOUNTER — Other Ambulatory Visit: Payer: Self-pay

## 2024-02-28 DIAGNOSIS — G8194 Hemiplegia, unspecified affecting left nondominant side: Secondary | ICD-10-CM | POA: Insufficient documentation

## 2024-02-28 DIAGNOSIS — M6281 Muscle weakness (generalized): Secondary | ICD-10-CM | POA: Insufficient documentation

## 2024-02-28 DIAGNOSIS — R278 Other lack of coordination: Secondary | ICD-10-CM | POA: Insufficient documentation

## 2024-02-28 DIAGNOSIS — I69352 Hemiplegia and hemiparesis following cerebral infarction affecting left dominant side: Secondary | ICD-10-CM | POA: Insufficient documentation

## 2024-02-28 DIAGNOSIS — R2681 Unsteadiness on feet: Secondary | ICD-10-CM | POA: Diagnosis not present

## 2024-02-28 NOTE — Therapy (Signed)
 OUTPATIENT OCCUPATIONAL THERAPY NEURO EVALUATION  Patient Name: Jon [name] MRN: [id number] DOB:1955-12-01, 68 y.o., male Today's Date: 02/28/2024  PCP: Sun, Vyvyan, MD REFERRING PROVIDER:   Carilyn Prentice BRAVO, MD    END OF SESSION:  OT End of Session - 02/28/24 1623     Visit Number 1    Number of Visits 13    Date for OT Re-Evaluation 04/26/24    Authorization Type [employer] [id number]    OT Start Time 1534    OT Stop Time 1624    OT Time Calculation (min) 50 min          Past Medical History:  Diagnosis Date   Bladder disorder 10/08/2001   Depression    Hypertension    Liver damage    Seizures (HCC)    none since 2018   Stroke [facility])    Past Surgical History:  Procedure Laterality Date   CATARACT EXTRACTION Bilateral    [facility] - removal & lense implants   TONSILLECTOMY AND ADENOIDECTOMY Bilateral    Age 47   Patient Active Problem List   Diagnosis Date Noted   History of hemorrhagic stroke with residual hemiplegia (HCC)    New onset seizure (HCC) 05/03/2019   Spastic hemiplegia affecting dominant side (HCC) 08/24/2017   Neuropathic pain of left shoulder 05/19/2016   Alcohol use disorder, severe, dependence (HCC) 02/15/2016   Muscle contusion 02/11/2016   Spastic hemiplegia affecting nondominant side (HCC)    Muscle spasticity    Poor appetite    Hyperlipidemia 01/14/2016   Depression 01/14/2016   Obesity 01/14/2016   Hyperglycemia 01/14/2016   Urinary tract infection, site not specified 01/14/2016   Basal ganglia hemorrhage (HCC) 01/14/2016   Hemiparesis affecting nondominant side as late effect of cerebrovascular accident [facility])    Cognitive deficit, post-stroke    Left hemiparesis (HCC)    Essential hypertension    Dysphagia, post-stroke    Gait disturbance, post-stroke    Hyponatremia    Thrombocytopenia (HCC)    Hemorrhagic stroke (HCC) R basal ganglia d/t HTN 01/08/2016    ONSET DATE: referral date 01/22/24  REFERRING  DIAG: G81.94 (ICD-10-CM) - Left hemiparesis (HCC)  THERAPY DIAG:  Hemiplegia and hemiparesis following cerebral infarction affecting left dominant side (HCC)  Muscle weakness (generalized)  Other lack of coordination  Unsteadiness on feet  Rationale for Evaluation and Treatment: Rehabilitation  SUBJECTIVE:   SUBJECTIVE STATEMENT: Pt reports that he follows up with Dr. Carilyn annually for medication management and botox.  Pt expressing desire to work on UE to regain andy functional return.  Pt asking questions about alternative treatments - mentioning transcranial magnetic stimulation. Pt accompanied by: self and significant other (spouse, [name])  PERTINENT HISTORY: hemorrhagic stroke R basal ganglia in 2017, chronic left-sided sensory deficits  PRECAUTIONS: Fall  WEIGHT BEARING RESTRICTIONS: No  PAIN:  Are you having pain? No  FALLS: Has patient fallen in last 6 months? Yes. Number of falls 1 - knocked over when walking through a door  LIVING ENVIRONMENT: Lives with: lives with their spouse Lives in: House/apartment Stairs: Yes: External: 3 steps; bilateral but cannot reach both Has following equipment at home: Quad cane large base, Wheelchair (power), shower chair, and Grab bars  PLOF: Independent with basic ADLs, Independent with household mobility with device, and Leisure: would like to return to playing guitar  PATIENT GOALS: to have more use of dominant L arm, would love to play guitar again  OBJECTIVE:  Note: Objective measures were  completed at Evaluation unless otherwise noted.  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 Medication management: spouse administers Handwriting: unable  MOBILITY STATUS: Mod I with LBQC  POSTURE COMMENTS:  rounded shoulders and forward head  ACTIVITY TOLERANCE: Activity tolerance: diminished, requires increased time and effort for most mobility  FUNCTIONAL OUTCOME MEASURES: Upper Extremity Functional Scale (UEFS): TBD at next visit  UPPER EXTREMITY ROM:    ROM Right eval AROM Left eval PROM Left Eval  Shoulder flexion  Return to neutral 67  Shoulder abduction   74  Shoulder adduction     Shoulder extension  10   Shoulder internal rotation     Shoulder external rotation     Elbow flexion  54-74 118  Elbow extension   -8  Wrist flexion  56 at rest -22 from neutral  Wrist extension   -22 flexion from neutral  Wrist ulnar deviation     Wrist radial deviation     Wrist pronation   full  Wrist supination   45 from neutral  (Blank rows = not tested)  UPPER EXTREMITY MMT:     MMT Right eval Left eval  Shoulder flexion    Shoulder abduction    Shoulder adduction    Shoulder extension    Shoulder internal rotation    Shoulder external rotation    Middle trapezius    Lower trapezius    Elbow flexion    Elbow extension    Wrist flexion    Wrist extension    Wrist ulnar deviation    Wrist radial deviation    Wrist pronation    Wrist supination    (Blank rows = not tested)  HAND FUNCTION: Unable to assess as pt unable to open hand  COORDINATION: Unable to assess as pt unable to open hand  SENSATION: Impaired  MUSCLE TONE: LUE: Moderate, Severe, and Hypertonic  COGNITION: Overall cognitive status: Within functional limits for tasks assessed  OBSERVATIONS: Pleasant gentleman, familiar to this therapist from stay in [facility] Rehab after CVA.  Pt very motivated to have any additional return of  function in dominant LUE                                                                                                                             TREATMENT DATE:  02/28/24 Engaged in discussion about additional and/or alternative treatment strategies and medical procedures to aid in motor recovery s/p CVA.  Engaged in discussion about TMS per pt request  as well as Vivistim - however Vivistim is not indicated for use after hemorrhagic stroke. Initiated shoulder flexion with cane.  OT instructing pt in placing RUE over L hand on handle of cane to facilitate increased shoulder flexion.  OT also educated on completion of towel glides on table top for shoulder flexion and abduction.      PATIENT EDUCATION: Education details: Educated on role and purpose of OT as well as potential interventions and goals for therapy based on initial evaluation findings. Person educated: Patient and Spouse Education method: Explanation, Demonstration, Tactile cues, Verbal cues, and Handouts Education comprehension: verbalized understanding and needs further education  HOME EXERCISE PROGRAM: Access Code: [email or web address] URL: [email or web address] Date: 02/28/2024 Prepared by: [facility] - Outpatient  Rehab - [facility]  Program Notes Put Right hand over top of Left hand to promote increased movement  Exercises - Seated Shoulder Flexion Towel Slide at Table Top  - 1 x daily - 2 sets - 10 reps - Seated Shoulder Abduction Towel Slide at Table Top  - 1 x daily - 2 sets - 10 reps - Seated Shoulder Flexion Extension AAROM with Dowel into Wall  - 1 x daily - 2 sets - 10 reps   GOALS: Goals reviewed with patient? Yes  SHORT TERM GOALS: Target date: 03/29/24  Pt and spouse will be independent with initial HEP for LUE Self-ROM and PROM. Baseline: Goal status: INITIAL  2.  Pt will report understanding of additional procedures and products to facilitate increased motor recovery of LUE and  hand. Baseline:  Goal status: INITIAL   LONG TERM GOALS: Target date: 04/26/24  Pt will verbalize understanding of adapted strategies and/or equipment for increasing independence and safety with ADLs/IADLs. (cutting food, laundry, increasing independence with ADLs, styling hair, etc)  Baseline:  Goal status: INITIAL  2.  Pt will be independent with updated HEP for LUE. Baseline:  Goal status: INITIAL  3.  Pt will be able to achieve loose gross grasp with LUE to aid in functional tasks. Baseline: slight thumb mobility but no active flexion/extension Goal status: INITIAL  4.   Pt will demonstrate improved elbow flexion and extension by 15 degrees TAM. Baseline: starts at 54*, able to flex to 74* Baseline:  Goal status: INITIAL  5.  Pt will demonstrate improved shoulder flexion to 20* to allow for increased ease to wash under arm and aid in UB dressing. Baseline: able to return to midline from 10* extension Goal status: INITIAL  ASSESSMENT:  CLINICAL IMPRESSION: Patient is a 68 y.o. male who was seen today for occupational therapy evaluation for deficits s/p remote right basal ganglia hemorrhage in 2027 with chronic left-sided hemisensory deficits and left upper extremity greater than left lower extremity weakness.  Overall he is functioning at a supervision level for mobility and ADLs.  His wife is very helpful at home.  Pt expressing desire to regain function in dominant LUE and asking questions about additional resources to further his potential recovery.  Pt will benefit from skilled occupational therapy services to address strength and coordination, ROM, pain management, altered sensation, GM/FM control, introduction of compensatory strategies/AE prn, and implementation of an HEP to improve participation and safety during ADLs and IADLs.    PERFORMANCE DEFICITS: in functional skills including ADLs, IADLs, coordination, dexterity, sensation, ROM, strength, pain, Fine motor control,  Gross motor control, body mechanics, decreased knowledge of precautions, decreased knowledge of use of DME, and UE functional use and psychosocial skills including coping strategies, environmental adaptation, and  routines and behaviors.   IMPAIRMENTS: are limiting patient from ADLs, IADLs, and leisure.   CO-MORBIDITIES: may have co-morbidities  that affects occupational performance. Patient will benefit from skilled OT to address above impairments and improve overall function.  MODIFICATION OR ASSISTANCE TO COMPLETE EVALUATION: Min-Moderate modification of tasks or assist with assess necessary to complete an evaluation.  OT OCCUPATIONAL PROFILE AND HISTORY: Detailed assessment: Review of records and additional review of physical, cognitive, psychosocial history related to current functional performance.  CLINICAL DECISION MAKING: Moderate - several treatment options, min-mod task modification necessary  REHAB POTENTIAL: Good  EVALUATION COMPLEXITY: Moderate    PLAN:  OT FREQUENCY: 1-2x/week  OT DURATION: 6 weeks (asking 8 for scheduling)  PLANNED INTERVENTIONS: 02831 OT Re-evaluation, 97535 self care/ADL training, 02889 therapeutic exercise, 97530 therapeutic activity, 97112 neuromuscular re-education, 97140 manual therapy, 97113 aquatic therapy, 97035 ultrasound, [id number] electrical stimulation (manual), 97760 Orthotic Initial, [id number] Orthotic/Prosthetic subsequent, passive range of motion, functional mobility training, compression bandaging, psychosocial skills training, energy conservation, coping strategies training, patient/family education, and DME and/or AE instructions  RECOMMENDED OTHER SERVICES: NA  CONSULTED AND AGREED WITH PLAN OF CARE: Patient and family member/caregiver  PLAN FOR NEXT SESSION: review HEP, review alternative post stroke treatments (Vivistim is not currently an option), encourage ROM/stretching   KAYLENE DOMINO, OTR/L 02/28/2024, 4:36 PM  [facility]  Outpatient Rehab at [facility] [address], [address] [place], [place] [address] Phone # [phone] Fax # [phone]

## 2024-03-07 ENCOUNTER — Ambulatory Visit: Admitting: Occupational Therapy

## 2024-03-14 ENCOUNTER — Ambulatory Visit: Admitting: Occupational Therapy

## 2024-03-17 NOTE — Progress Notes (Unsigned)
 PATIENT: Jon [name] DOB: 02-28-1956  REASON FOR VISIT: follow up HISTORY FROM: patient  No chief complaint on file.    HISTORY OF PRESENT ILLNESS:  03/17/24 ALL: [name] returns for follow up for seizures. He continues levetiracetam  500mg  BID.   03/15/2023 ALL: Jon [name] returns for follow up for seizures. He continues lev 500mg  BID. He is tolerating ASM well without obvious adverse effects. He denies any seizure activity. He continues to follow up with Dr Carilyn, rehab and now seeing Dr Austin, PCP. He had PT eval last month for new wheelchair. He is hoping to get a [employer]. He uses four prong cane for shorter distances. PCP recently sent orders for new cane. He continues to have left sided hemiplegia. Botox was not helpful for left upper ext spasticity. He continues pravastatin . BP is normal.   03/14/2022 ALL: Jon [name] returns for follow up for seizures. He was last seen by Dr Margaret and doing well. He continues levetiracetam  500mg  BID. No recent seizure. He continues close follow up with Dr Gib for co morbidity management and stroke prevention. He continues pravastatin . BP is usually well managed.   03/04/2020 ALL: Jon [name] is a 68 y.o. male here today for follow up for seizure. He is s/p ICH in 2017 resulting in left hemiplegia. He continues levetiracetam  500mg  BID. He is tolerating medication well and denies seizure activity. He does have a history of depression but states that it has not worsened. He is followed by Dr Gib, PCP and continues sertraline  and quetiapine . He is also seen regularly by [facility], Rehab. He continues diazepam , prescribed by rehab, for spasticity. He is also followed by Dr Corina, neuropsychology. He is able to walk at home but uses wheelchair for long distance traveling.   HISTORY: (copied from Dr Chancy note on 07/05/2019)  68 year old male with history of right basal ganglia intracerebral hemorrhage in 2017, here for evaluation of  seizure.  Patient had new onset seizure on 05/03/2019, early in the morning when he was sleeping.  Patient started shaking and this woke up his wife.  She called EMS who arrived on scene.  Patient was confused and combative.  Patient went to the hospital for evaluation and treatment.  Patient was transferred to ICU due to agitation and need for IV sedation.  No electrographic seizures on EEG.  Spinal tap ruled out CNS infection.  Patient had somewhat prolonged postictal state but gradually returned to baseline.  He was discharged on levetiracetam  500 mg twice a day.   Since that time he is doing well.  No further seizures.  He is gradually returning to himself.  He was having some depression initially which is improving.   REVIEW OF SYSTEMS: Out of a complete 14 system review of symptoms, the patient complains only of the following symptoms, gait impairment, left sided weakness, depression, anxiety, and all other reviewed systems are negative.  ALLERGIES: Allergies  Allergen Reactions   Gluten Meal Nausea And Vomiting   Lactose Intolerance (Gi) Nausea And Vomiting   Other     Tree nuts    Shellfish Allergy Swelling and Rash    HOME MEDICATIONS: Outpatient Medications Prior to Visit  Medication Sig Dispense Refill   atorvastatin (LIPITOR) 20 MG tablet Take 20 mg by mouth daily.     diazepam  (VALIUM ) 2 MG tablet TAKE ONE TABLET BY MOUTH AT BEDTIME 90 tablet 0   fluticasone  (FLONASE ) 50 MCG/ACT nasal spray Place 1 spray into both nostrils daily. (Patient  taking differently: Place 1 spray into both nostrils daily as needed.) 1 g 2   folic acid  (FOLVITE ) 1 MG tablet Take 1 tablet (1 mg total) by mouth daily. (Patient taking differently: Take 1 mg by mouth 3 (three) times daily.) 30 tablet 0   folic acid  (FOLVITE ) 400 MCG tablet Take 400 mcg by mouth daily.     levETIRAcetam  (KEPPRA ) 500 MG tablet Take 1 tablet (500 mg total) by mouth 2 (two) times daily. 180 tablet 4   metoprolol  tartrate  (LOPRESSOR ) 25 MG tablet Take 1 tablet (25 mg total) by mouth 2 (two) times daily. 60 tablet 1   Multiple Vitamin (MULTIVITAMIN WITH MINERALS) TABS tablet Take 1 tablet by mouth daily.     pravastatin  (PRAVACHOL ) 40 MG tablet Take 1 tablet (40 mg total) by mouth daily at 6 PM. 30 tablet 1   QUEtiapine  (SEROQUEL ) 25 MG tablet Take 0.5 tablets (12.5 mg total) by mouth at bedtime. 30 tablet 1   sertraline  (ZOLOFT ) 50 MG tablet Take 1 tablet (50 mg total) by mouth daily. (Patient taking differently: Take 50 mg by mouth at bedtime.) 30 tablet 1   solifenacin (VESICARE) 5 MG tablet Take 5 mg by mouth daily.     Solifenacin Succinate (VESICARE PO) Take 5 mg by mouth daily. Taking once daily Mg unknown     tadalafil (CIALIS) 5 MG tablet Take 5 mg by mouth daily.      tamsulosin (FLOMAX) 0.4 MG CAPS capsule      No facility-administered medications prior to visit.    PAST MEDICAL HISTORY: Past Medical History:  Diagnosis Date   Bladder disorder 10/08/2001   Depression    Hypertension    Liver damage    Seizures (HCC)    none since 2018   Stroke [facility])     PAST SURGICAL HISTORY: Past Surgical History:  Procedure Laterality Date   CATARACT EXTRACTION Bilateral    [facility] - removal & lense implants   TONSILLECTOMY AND ADENOIDECTOMY Bilateral    Age 78    FAMILY HISTORY: No family history on file.  SOCIAL HISTORY: Social History   Socioeconomic History   Marital status: Married    Spouse name: [name]   Number of children: Not on file   Years of education: Not on file   Highest education level: Not on file  Occupational History   Not on file  Tobacco Use   Smoking status: Former    Current packs/day: 0.00    Average packs/day: 1 pack/day for 23.0 years (23.0 ttl pk-yrs)    Types: Cigarettes    Start date: 01/07/1993    Quit date: 01/08/2016    Years since quitting: 8.1   Smokeless tobacco: Never  Vaping Use   Vaping status: Never Used  Substance and Sexual Activity    Alcohol use: Not Currently    Alcohol/week: 12.0 standard drinks of alcohol    Types: 12 Glasses of wine per week    Comment: Wife reports chronic alcohol use. 01/12/16   Drug use: No    Types: Marijuana    Comment: teens to early 20's   Sexual activity: Not on file  Other Topics Concern   Not on file  Social History Narrative   Lives home, retired.  Married.    Social Drivers of Corporate investment banker Strain: Not on file  Food Insecurity: Not on file  Transportation Needs: Not on file  Physical Activity: Not on file  Stress: Not on  file  Social Connections: Not on file  Intimate Partner Violence: Not on file      PHYSICAL EXAM  There were no vitals filed for this visit.    There is no height or weight on file to calculate BMI.  Generalized: Well developed, in no acute distress  Cardiology: normal rate and rhythm, no murmur noted Respiratory: clear to auscultation bilaterally  Neurological examination  Mentation: Alert oriented to time, place, history taking. Follows all commands speech and language fluent Cranial nerve II-XII: Pupils were equal round reactive to light. Extraocular movements were full, visual field were full on confrontational test. Facial sensation and strength were normal. Uvula tongue midline. Head turning and shoulder shrug  were normal and symmetric. Motor: The motor testing reveals 5 over 5 strength of right upper and lower extremity, left upper 2/5, left lower 3/5.  Sensory: Sensory testing is intact to soft touch on all 4 extremities. No evidence of extinction is noted.  Coordination: Cerebellar testing reveals good finger-nose-finger and heel-to-shin on right, unable to perform on left Gait and station: able to push to standing position with right UE. Hemiplegic gait. Stable with four prong cane, using wheelchair today  Reflexes: Deep tendon reflexes are symmetric and normal bilaterally.   DIAGNOSTIC DATA (LABS, IMAGING, TESTING) - I  reviewed patient records, labs, notes, testing and imaging myself where available.      No data to display           Lab Results  Component Value Date   WBC 8.2 05/06/2019   HGB 13.3 05/06/2019   HCT 38.1 (L) 05/06/2019   MCV 90.3 05/06/2019   PLT 123 (L) 05/06/2019      Component Value Date/Time   NA 139 05/07/2019 0356   K 3.9 05/07/2019 0356   CL 104 05/07/2019 0356   CO2 21 (L) 05/07/2019 0356   GLUCOSE 64 (L) 05/07/2019 0356   BUN 5 (L) 05/07/2019 0356   CREATININE 0.66 05/07/2019 0356   CALCIUM 8.6 (L) 05/07/2019 0356   PROT 7.3 05/03/2019 0707   ALBUMIN 4.2 05/03/2019 0707   AST 22 05/03/2019 0707   ALT 14 05/03/2019 0707   ALKPHOS 53 05/03/2019 0707   BILITOT 1.1 05/03/2019 0707   GFRNONAA >60 05/07/2019 0356   GFRAA >60 05/07/2019 0356   Lab Results  Component Value Date   CHOL 250 (H) 01/09/2016   HDL 51 01/09/2016   LDLCALC 157 (H) 01/09/2016   TRIG 212 (H) 01/09/2016   CHOLHDL 4.9 01/09/2016   Lab Results  Component Value Date   HGBA1C 5.5 01/09/2016   Lab Results  Component Value Date   VITAMINB12 449 01/09/2016   Lab Results  Component Value Date   TSH 3.682 05/03/2019       ASSESSMENT AND PLAN 68 y.o. year old male  has a past medical history of Bladder disorder (10/08/2001), Depression, Hypertension, Liver damage, Seizures (HCC), and Stroke (HCC). here with   No diagnosis found.    Jon [name] is doing well, today. We will continue levetiracetam  500mg  twice daily. He will continue close follow up with PCP for depression, insomnia and stroke prevention. He will continue follow up with Dr Carilyn and Dr Corina as advised. Healthy lifestyle habits encouraged. He will follow up with us  in 1 year, sooner if needed.    No orders of the defined types were placed in this encounter.    No orders of the defined types were placed in this encounter.     Jon Ramiro  Cary, FNP-C 03/17/2024, 7:55 PM [facility] [address]  [address] [place], [place] [address] [phone]

## 2024-03-17 NOTE — Patient Instructions (Signed)
 Below is our plan:  We will continue levetiracetam 500mg  twice daily. I have placed orders for PT evaluation. Try to exercise for 30 minutes at least 5 days a week.   Please make sure you are consistent with timing of seizure medication. I recommend annual visit with primary care provider (PCP) for complete physical and routine blood work. I recommend daily intake of vitamin D (400-800iu) and calcium (800-1000mg ) for bone health. Discuss Dexa screening with PCP.   According to [place] law, you can not drive unless you are seizure / syncope free for at least 6 months and under physician's care.  Please maintain precautions. Do not participate in activities where a loss of awareness could harm you or someone else. No swimming alone, no tub bathing, no hot tubs, no driving, no operating motorized vehicles (cars, ATVs, motocycles, etc), lawnmowers, power tools or firearms. No standing at heights, such as rooftops, ladders or stairs. Avoid hot objects such as stoves, heaters, open fires. Wear a helmet when riding a bicycle, scooter, skateboard, etc. and avoid areas of traffic. Set your water heater to 120 degrees or less.  SUDEP is the sudden, unexpected death of someone with epilepsy, who was otherwise healthy. In SUDEP cases, no other cause of death is found when an autopsy is done. Each year, more than 1 in 1,000 people with epilepsy die from SUDEP. This is the leading cause of death in people with uncontrolled seizures. Until further answers are available, the best way to prevent SUDEP is to lower your risk by controlling seizures. Research has found that people with all types of epilepsy that experience convulsive seizures can be at risk.  Please make sure you are staying well hydrated. I recommend 50-60 ounces daily. Well balanced diet and regular exercise encouraged. Consistent sleep schedule with 6-8 hours recommended.   Please continue follow up with care team as directed.   Follow up with me in 1  year   You may receive a survey regarding today's visit. I encourage you to leave honest feed back as I do use this information to improve patient care. Thank you for seeing me today!

## 2024-03-18 ENCOUNTER — Ambulatory Visit: Payer: Medicare HMO | Admitting: Family Medicine

## 2024-03-18 ENCOUNTER — Encounter: Payer: Self-pay | Admitting: Family Medicine

## 2024-03-18 VITALS — BP 100/58 | HR 56 | Ht 70.0 in

## 2024-03-18 DIAGNOSIS — G40909 Epilepsy, unspecified, not intractable, without status epilepticus: Secondary | ICD-10-CM | POA: Diagnosis not present

## 2024-03-18 MED ORDER — LEVETIRACETAM 500 MG PO TABS: 500.0000 mg | ORAL_TABLET | Freq: Two times a day (BID) | ORAL | 4 refills | Status: AC

## 2024-03-22 ENCOUNTER — Ambulatory Visit: Admitting: Occupational Therapy

## 2024-03-28 ENCOUNTER — Encounter: Admitting: Occupational Therapy

## 2024-04-01 ENCOUNTER — Encounter: Admitting: Occupational Therapy

## 2024-04-03 ENCOUNTER — Encounter: Admitting: Occupational Therapy

## 2024-04-08 ENCOUNTER — Encounter: Admitting: Occupational Therapy

## 2024-04-10 ENCOUNTER — Encounter: Admitting: Occupational Therapy

## 2024-06-19 DIAGNOSIS — F3342 Major depressive disorder, recurrent, in full remission: Secondary | ICD-10-CM | POA: Diagnosis not present

## 2024-06-19 DIAGNOSIS — I10 Essential (primary) hypertension: Secondary | ICD-10-CM | POA: Diagnosis not present

## 2024-06-19 DIAGNOSIS — I69354 Hemiplegia and hemiparesis following cerebral infarction affecting left non-dominant side: Secondary | ICD-10-CM | POA: Diagnosis not present

## 2024-06-19 DIAGNOSIS — E782 Mixed hyperlipidemia: Secondary | ICD-10-CM | POA: Diagnosis not present

## 2024-06-19 DIAGNOSIS — G40909 Epilepsy, unspecified, not intractable, without status epilepticus: Secondary | ICD-10-CM | POA: Diagnosis not present

## 2024-06-19 DIAGNOSIS — I700 Atherosclerosis of aorta: Secondary | ICD-10-CM | POA: Diagnosis not present

## 2024-06-19 DIAGNOSIS — Z125 Encounter for screening for malignant neoplasm of prostate: Secondary | ICD-10-CM | POA: Diagnosis not present

## 2024-06-19 DIAGNOSIS — Z0000 Encounter for general adult medical examination without abnormal findings: Secondary | ICD-10-CM | POA: Diagnosis not present

## 2024-06-19 DIAGNOSIS — Z23 Encounter for immunization: Secondary | ICD-10-CM | POA: Diagnosis not present

## 2024-06-19 DIAGNOSIS — D696 Thrombocytopenia, unspecified: Secondary | ICD-10-CM | POA: Diagnosis not present

## 2024-06-19 DIAGNOSIS — N400 Enlarged prostate without lower urinary tract symptoms: Secondary | ICD-10-CM | POA: Diagnosis not present

## 2024-06-19 DIAGNOSIS — I493 Ventricular premature depolarization: Secondary | ICD-10-CM | POA: Diagnosis not present

## 2024-06-19 DIAGNOSIS — F1021 Alcohol dependence, in remission: Secondary | ICD-10-CM | POA: Diagnosis not present

## 2024-08-02 ENCOUNTER — Inpatient Hospital Stay

## 2024-08-02 ENCOUNTER — Inpatient Hospital Stay: Attending: Hematology and Oncology | Admitting: Hematology and Oncology

## 2024-08-02 ENCOUNTER — Encounter: Payer: Self-pay | Admitting: Hematology and Oncology

## 2024-08-02 VITALS — BP 102/56 | HR 59 | Temp 98.3°F | Resp 18 | Ht 70.0 in | Wt 159.2 lb

## 2024-08-02 DIAGNOSIS — Z79899 Other long term (current) drug therapy: Secondary | ICD-10-CM | POA: Insufficient documentation

## 2024-08-02 DIAGNOSIS — Z87891 Personal history of nicotine dependence: Secondary | ICD-10-CM | POA: Diagnosis not present

## 2024-08-02 DIAGNOSIS — E639 Nutritional deficiency, unspecified: Secondary | ICD-10-CM | POA: Insufficient documentation

## 2024-08-02 DIAGNOSIS — D696 Thrombocytopenia, unspecified: Secondary | ICD-10-CM

## 2024-08-02 DIAGNOSIS — F1021 Alcohol dependence, in remission: Secondary | ICD-10-CM | POA: Insufficient documentation

## 2024-08-02 DIAGNOSIS — Z8673 Personal history of transient ischemic attack (TIA), and cerebral infarction without residual deficits: Secondary | ICD-10-CM | POA: Insufficient documentation

## 2024-08-02 LAB — HEMOGLOBIN A1C
Hgb A1c MFr Bld: 5 % (ref 4.8–5.6)
Mean Plasma Glucose: 96.8 mg/dL

## 2024-08-02 LAB — CMP (CANCER CENTER ONLY)
ALT: 26 U/L (ref 0–44)
AST: 25 U/L (ref 15–41)
Albumin: 4.4 g/dL (ref 3.5–5.0)
Alkaline Phosphatase: 68 U/L (ref 38–126)
Anion gap: 8 (ref 5–15)
BUN: 14 mg/dL (ref 8–23)
CO2: 31 mmol/L (ref 22–32)
Calcium: 9.7 mg/dL (ref 8.9–10.3)
Chloride: 104 mmol/L (ref 98–111)
Creatinine: 0.95 mg/dL (ref 0.61–1.24)
GFR, Estimated: >60 mL/min
Glucose, Bld: 96 mg/dL (ref 70–99)
Potassium: 4.7 mmol/L (ref 3.5–5.1)
Sodium: 143 mmol/L (ref 135–145)
Total Bilirubin: 0.9 mg/dL (ref 0.0–1.2)
Total Protein: 7.5 g/dL (ref 6.5–8.1)

## 2024-08-02 LAB — CBC WITH DIFFERENTIAL (CANCER CENTER ONLY)
Abs Immature Granulocytes: 0.02 K/uL (ref 0.00–0.07)
Basophils Absolute: 0.1 K/uL (ref 0.0–0.1)
Basophils Relative: 1 %
Eosinophils Absolute: 0.2 K/uL (ref 0.0–0.5)
Eosinophils Relative: 2 %
HCT: 40.7 % (ref 39.0–52.0)
Hemoglobin: 13.8 g/dL (ref 13.0–17.0)
Immature Granulocytes: 0 %
Lymphocytes Relative: 18 %
Lymphs Abs: 1.4 K/uL (ref 0.7–4.0)
MCH: 31.8 pg (ref 26.0–34.0)
MCHC: 33.9 g/dL (ref 30.0–36.0)
MCV: 93.8 fL (ref 80.0–100.0)
Monocytes Absolute: 0.7 K/uL (ref 0.1–1.0)
Monocytes Relative: 9 %
Neutro Abs: 5.4 K/uL (ref 1.7–7.7)
Neutrophils Relative %: 70 %
Platelet Count: 133 K/uL — ABNORMAL LOW (ref 150–400)
RBC: 4.34 MIL/uL (ref 4.22–5.81)
RDW: 12.5 % (ref 11.5–15.5)
WBC Count: 7.8 K/uL (ref 4.0–10.5)
nRBC: 0 % (ref 0.0–0.2)

## 2024-08-02 LAB — VITAMIN B12: Vitamin B-12: 665 pg/mL (ref 180–914)

## 2024-08-02 NOTE — Assessment & Plan Note (Signed)
 The patient is noted to have chronic thrombocytopenia intermittently since 2017 In the past, the patient has significant history of alcoholism and the cause of his thrombocytopenia then is likely due to alcohol intake I reviewed his CT imaging from 2024 which show signs of early liver cirrhosis That could also be a cause of his thrombocytopenia Seroquel  is known to cause thrombocytopenia and his poor dietary choices could cause trace mineral deficiencies causing thrombocytopenia I will order additional workup today I will call his wife with test results next week His thrombocytopenia is very mild, consistently over 100 There is no consequences to his health and he is not considered at risk of bleeding unless his platelet count dropped to less than 50 He does not need long-term follow-up with me

## 2024-08-02 NOTE — Assessment & Plan Note (Signed)
 He has very poor food choices We discussed importance of balanced meal, higher protein intake and less processed food I am concerned about trace nutritional deficiency and will order additional test

## 2024-08-02 NOTE — Progress Notes (Signed)
 [facility] CONSULT NOTE  Patient Care Team: Sun, Vyvyan, MD as PCP - General (Family Medicine)  ASSESSMENT & PLAN Thrombocytopenia The patient is noted to have chronic thrombocytopenia intermittently since 2017 In the past, the patient has significant history of alcoholism and the cause of his thrombocytopenia then is likely due to alcohol intake I reviewed his CT imaging from 2024 which show signs of early liver cirrhosis That could also be a cause of his thrombocytopenia Seroquel  is known to cause thrombocytopenia and his poor dietary choices could cause trace mineral deficiencies causing thrombocytopenia I will order additional workup today I will call his wife with test results next week His thrombocytopenia is very mild, consistently over 100 There is no consequences to his health and he is not considered at risk of bleeding unless his platelet count dropped to less than 50 He does not need long-term follow-up with me  Poor diet He has very poor food choices We discussed importance of balanced meal, higher protein intake and less processed food I am concerned about trace nutritional deficiency and will order additional test   Orders Placed This Encounter  Procedures   CBC with Differential (Cancer Center Only)    Standing Status:   Future    Number of Occurrences:   1    Expiration Date:   08/02/2025   CMP (Cancer Center only)    Standing Status:   Future    Number of Occurrences:   1    Expiration Date:   08/02/2025   Vitamin B12    Standing Status:   Future    Number of Occurrences:   1    Expiration Date:   08/02/2025   Copper , serum    Standing Status:   Future    Number of Occurrences:   1    Expiration Date:   08/02/2025   Zinc     Standing Status:   Future    Number of Occurrences:   1    Expiration Date:   08/02/2025   Hemoglobin A1c    Standing Status:   Future    Number of Occurrences:   1    Expiration Date:   08/02/2025   Vitamin B1    Standing  Status:   Future    Number of Occurrences:   1    Expiration Date:   08/02/2025   All questions were answered. The patient knows to call the clinic with any problems, questions or concerns. No barriers to learning was detected. The total time spent in the appointment was 55 minutes encounter with patients including review of chart and various tests results, discussions about plan of care and coordination of care plan  [name], MD 08/02/2024 1:48 PM  CHIEF COMPLAINTS/PURPOSE OF CONSULTATION:  Chronic thrombocytopenia  HISTORY OF PRESENTING ILLNESS:  Jon [name] 69 y.o. male is here because of thrombocytopenia.  He was found to have abnormal CBC from routine blood work I have the opportunity to review his CBC dated back to 2017 He has mild intermittent thrombocytopenia and then He was admitted with stroke at that time The patient has significant history of alcoholism and smoking in the past but quit since 2017 He was admitted to the hospital with seizure in 2020 and had mild thrombocytopenia I reviewed records from his primary care doctor's office On November 09, 2022, platelet count was 126 On May 12, 2023, platelet count 133 On June 19, 2024, platelet count 105 He denies recent bruising/bleeding, such  as spontaneous epistaxis, hematuria, melena or hematochezia The patient denies history of liver disease, exposure to heparin , history of cardiac murmur/prior cardiovascular surgery or recent new medications He denies prior blood or platelet transfusions The patient has very poor dietary food intake He eats 1 box of honey nut Cheerios daily with minimum fruits and vegetables.  He also drink 1 protein shake that contain approximately 20 g of protein according to his wife No recent seizures  MEDICAL HISTORY:  Past Medical History:  Diagnosis Date   Bladder disorder 10/08/2001   Depression    Hypertension    Liver damage    Seizures (HCC)    none since 2018   Stroke [facility])      SURGICAL HISTORY: Past Surgical History:  Procedure Laterality Date   CATARACT EXTRACTION Bilateral    [facility] - removal & lense implants   TONSILLECTOMY AND ADENOIDECTOMY Bilateral    Age 19    SOCIAL HISTORY: Social History   Socioeconomic History   Marital status: Married    Spouse name: [name]   Number of children: Not on file   Years of education: Not on file   Highest education level: Not on file  Occupational History   Not on file  Tobacco Use   Smoking status: Former    Current packs/day: 0.00    Average packs/day: 1 pack/day for 23.0 years (23.0 ttl pk-yrs)    Types: Cigarettes    Start date: 01/07/1993    Quit date: 01/08/2016    Years since quitting: 8.5   Smokeless tobacco: Never  Vaping Use   Vaping status: Never Used  Substance and Sexual Activity   Alcohol use: Not Currently    Alcohol/week: 12.0 standard drinks of alcohol    Types: 12 Glasses of wine per week    Comment: Wife reports chronic alcohol use. 01/12/16   Drug use: No    Types: Marijuana    Comment: teens to early 20's   Sexual activity: Not on file  Other Topics Concern   Not on file  Social History Narrative   Lives home, retired.  Married.    Social Drivers of Health   Tobacco Use: Medium Risk (08/02/2024)   Patient History    Smoking Tobacco Use: Former    Smokeless Tobacco Use: Never    Passive Exposure: Not on Actuary Strain: Not on file  Food Insecurity: Not on file  Transportation Needs: Not on file  Physical Activity: Not on file  Stress: Not on file  Social Connections: Not on file  Intimate Partner Violence: Not on file  Depression (PHQ2-9): Low Risk (10/13/2022)   Depression (PHQ2-9)    PHQ-2 Score: 2  Alcohol Screen: Not on file  Housing: Not on file  Utilities: Not on file  Health Literacy: Not on file    FAMILY HISTORY: History reviewed. No pertinent family history.  ALLERGIES:  is allergic to gluten meal, lactose intolerance  (gi), other, and shellfish allergy.  MEDICATIONS:  Current Outpatient Medications  Medication Sig Dispense Refill   clindamycin (CLEOCIN T) 1 % external solution Apply 1 Application topically 2 (two) times daily as needed.     atorvastatin (LIPITOR) 20 MG tablet Take 20 mg by mouth daily.     diazepam  (VALIUM ) 2 MG tablet TAKE ONE TABLET BY MOUTH AT BEDTIME 90 tablet 0   diphenhydrAMINE  (BENADRYL  ALLERGY) 25 MG tablet Take 25 mg by mouth every 8 (eight) hours as needed.     folic  acid (FOLVITE ) 400 MCG tablet Take 400 mcg by mouth daily.     levETIRAcetam  (KEPPRA ) 500 MG tablet Take 1 tablet (500 mg total) by mouth 2 (two) times daily. 180 tablet 4   metoprolol  tartrate (LOPRESSOR ) 25 MG tablet Take 1 tablet (25 mg total) by mouth 2 (two) times daily. 60 tablet 1   Multiple Vitamin (MULTIVITAMIN WITH MINERALS) TABS tablet Take 1 tablet by mouth daily.     QUEtiapine  (SEROQUEL ) 25 MG tablet Take 0.5 tablets (12.5 mg total) by mouth at bedtime. 30 tablet 1   sertraline  (ZOLOFT ) 50 MG tablet Take 1 tablet (50 mg total) by mouth daily. (Patient taking differently: Take 50 mg by mouth at bedtime.) 30 tablet 1   Solifenacin Succinate (VESICARE PO) Take 5 mg by mouth daily. Taking once daily Mg unknown     tadalafil (CIALIS) 5 MG tablet Take 5 mg by mouth daily.      tamsulosin (FLOMAX) 0.4 MG CAPS capsule      valACYclovir (VALTREX) 1000 MG tablet Take 1,000 mg by mouth 2 (two) times daily as needed. BID PRN for 3 days     No current facility-administered medications for this visit.    REVIEW OF SYSTEMS:   Constitutional: Denies fevers, chills or abnormal night sweats Eyes: Denies blurriness of vision, double vision or watery eyes Ears, nose, mouth, throat, and face: Denies mucositis or sore throat Respiratory: Denies cough, dyspnea or wheezes Cardiovascular: Denies palpitation, chest discomfort or lower extremity swelling Gastrointestinal:  Denies nausea, heartburn or change in bowel  habits Skin: Denies abnormal skin rashes Lymphatics: Denies new lymphadenopathy or easy bruising Neurological:Denies numbness, tingling or new weaknesses Behavioral/Psych: Mood is stable, no new changes  All other systems were reviewed with the patient and are negative.  PHYSICAL EXAMINATION: ECOG PERFORMANCE STATUS: 0 - Asymptomatic  Vitals:   08/02/24 1318  BP: (!) 102/56  Pulse: (!) 59  Resp: 18  Temp: 98.3 F (36.8 C)  SpO2: 98%   Filed Weights   08/02/24 1318  Weight: 159 lb 3.2 oz (72.2 kg)    GENERAL:alert, no distress and comfortable SKIN: skin color, texture, turgor are normal, no rashes or significant lesions EYES: normal, conjunctiva are pink and non-injected, sclera clear OROPHARYNX:no exudate, no erythema and lips, buccal mucosa, and tongue normal  NECK: supple, thyroid  normal size, non-tender, without nodularity LYMPH:  no palpable lymphadenopathy in the cervical, axillary or inguinal LUNGS: clear to auscultation and percussion with normal breathing effort HEART: regular rate & rhythm and no murmurs and no lower extremity edema ABDOMEN:abdomen soft, non-tender and normal bowel sounds Musculoskeletal:no cyanosis of digits and no clubbing  PSYCH: alert & oriented x 3 with fluent speech NEURO: He has left-sided weakness  LABORATORY DATA:  I have reviewed the data as listed Lab Results  Component Value Date   WBC 8.2 05/06/2019   HGB 13.3 05/06/2019   HCT 38.1 (L) 05/06/2019   MCV 90.3 05/06/2019   PLT 123 (L) 05/06/2019     RADIOGRAPHIC STUDIES: I have reviewed his prior CT imaging from 2024

## 2024-08-03 LAB — ZINC: Zinc: 66 ug/dL (ref 44–115)

## 2024-08-03 LAB — COPPER, SERUM: Copper: 82 ug/dL (ref 69–132)

## 2024-08-05 ENCOUNTER — Telehealth: Payer: Self-pay

## 2024-08-05 LAB — VITAMIN B1: Vitamin B1 (Thiamine): 175.1 nmol/L (ref 66.5–200.0)

## 2024-08-05 NOTE — Telephone Encounter (Signed)
 Called wife and given below message. She verbalized understanding and appreciated the call.

## 2024-08-05 NOTE — Telephone Encounter (Signed)
-----   Message from [name], MD sent at 08/05/2024 11:39 AM EST ----- Pls call his wife All other tests are normal Overall the low platelet is likely due to seroquel  and mild liver disease seen on CT No need return

## 2024-09-05 ENCOUNTER — Ambulatory Visit: Payer: PPO | Admitting: Physical Medicine & Rehabilitation

## 2025-03-24 ENCOUNTER — Ambulatory Visit: Admitting: Family Medicine
